# Patient Record
Sex: Female | Born: 1940 | Race: White | Hispanic: No | Smoking: Never smoker
Health system: Southern US, Community
[De-identification: ages and names within clinical notes are randomized; demographics above are authoritative.]

## PROBLEM LIST (undated history)

## (undated) DIAGNOSIS — R233 Spontaneous ecchymoses: Secondary | ICD-10-CM

## (undated) DIAGNOSIS — J189 Pneumonia, unspecified organism: Secondary | ICD-10-CM

## (undated) DIAGNOSIS — D869 Sarcoidosis, unspecified: Secondary | ICD-10-CM

## (undated) DIAGNOSIS — Z9981 Dependence on supplemental oxygen: Secondary | ICD-10-CM

## (undated) DIAGNOSIS — E785 Hyperlipidemia, unspecified: Secondary | ICD-10-CM

## (undated) DIAGNOSIS — R531 Weakness: Secondary | ICD-10-CM

## (undated) DIAGNOSIS — M109 Gout, unspecified: Secondary | ICD-10-CM

## (undated) DIAGNOSIS — J42 Unspecified chronic bronchitis: Secondary | ICD-10-CM

## (undated) DIAGNOSIS — G4700 Insomnia, unspecified: Secondary | ICD-10-CM

## (undated) DIAGNOSIS — I5032 Chronic diastolic (congestive) heart failure: Secondary | ICD-10-CM

## (undated) DIAGNOSIS — G4733 Obstructive sleep apnea (adult) (pediatric): Secondary | ICD-10-CM

## (undated) DIAGNOSIS — Z9289 Personal history of other medical treatment: Secondary | ICD-10-CM

## (undated) DIAGNOSIS — I4819 Other persistent atrial fibrillation: Secondary | ICD-10-CM

## (undated) DIAGNOSIS — Z9989 Dependence on other enabling machines and devices: Secondary | ICD-10-CM

## (undated) DIAGNOSIS — J449 Chronic obstructive pulmonary disease, unspecified: Secondary | ICD-10-CM

## (undated) DIAGNOSIS — K469 Unspecified abdominal hernia without obstruction or gangrene: Secondary | ICD-10-CM

## (undated) DIAGNOSIS — E119 Type 2 diabetes mellitus without complications: Secondary | ICD-10-CM

## (undated) DIAGNOSIS — J40 Bronchitis, not specified as acute or chronic: Secondary | ICD-10-CM

## (undated) DIAGNOSIS — S8292XA Unspecified fracture of left lower leg, initial encounter for closed fracture: Secondary | ICD-10-CM

## (undated) DIAGNOSIS — I10 Essential (primary) hypertension: Secondary | ICD-10-CM

## (undated) DIAGNOSIS — F0390 Unspecified dementia without behavioral disturbance: Secondary | ICD-10-CM

## (undated) DIAGNOSIS — R05 Cough: Secondary | ICD-10-CM

## (undated) DIAGNOSIS — C50919 Malignant neoplasm of unspecified site of unspecified female breast: Secondary | ICD-10-CM

## (undated) DIAGNOSIS — R6883 Chills (without fever): Secondary | ICD-10-CM

## (undated) DIAGNOSIS — E46 Unspecified protein-calorie malnutrition: Secondary | ICD-10-CM

## (undated) DIAGNOSIS — Z7901 Long term (current) use of anticoagulants: Secondary | ICD-10-CM

## (undated) DIAGNOSIS — R059 Cough, unspecified: Secondary | ICD-10-CM

## (undated) DIAGNOSIS — D693 Immune thrombocytopenic purpura: Secondary | ICD-10-CM

## (undated) DIAGNOSIS — R062 Wheezing: Secondary | ICD-10-CM

## (undated) DIAGNOSIS — C55 Malignant neoplasm of uterus, part unspecified: Secondary | ICD-10-CM

## (undated) DIAGNOSIS — R238 Other skin changes: Secondary | ICD-10-CM

## (undated) DIAGNOSIS — D649 Anemia, unspecified: Secondary | ICD-10-CM

## (undated) HISTORY — DX: Gout, unspecified: M10.9

## (undated) HISTORY — DX: Anemia, unspecified: D64.9

## (undated) HISTORY — DX: Wheezing: R06.2

## (undated) HISTORY — DX: Sarcoidosis, unspecified: D86.9

## (undated) HISTORY — DX: Other skin changes: R23.8

## (undated) HISTORY — DX: Malignant neoplasm of unspecified site of unspecified female breast: C50.919

## (undated) HISTORY — DX: Chronic diastolic (congestive) heart failure: I50.32

## (undated) HISTORY — DX: Hyperlipidemia, unspecified: E78.5

## (undated) HISTORY — DX: Personal history of other medical treatment: Z92.89

## (undated) HISTORY — DX: Chills (without fever): R68.83

## (undated) HISTORY — DX: Weakness: R53.1

## (undated) HISTORY — DX: Spontaneous ecchymoses: R23.3

## (undated) HISTORY — DX: Chronic obstructive pulmonary disease, unspecified: J44.9

## (undated) HISTORY — PX: TOTAL KNEE ARTHROPLASTY: SHX125

## (undated) HISTORY — DX: Unspecified protein-calorie malnutrition: E46

## (undated) HISTORY — DX: Unspecified abdominal hernia without obstruction or gangrene: K46.9

## (undated) HISTORY — DX: Bronchitis, not specified as acute or chronic: J40

## (undated) HISTORY — DX: Cough: R05

## (undated) HISTORY — DX: Unspecified dementia, unspecified severity, without behavioral disturbance, psychotic disturbance, mood disturbance, and anxiety: F03.90

## (undated) HISTORY — DX: Insomnia, unspecified: G47.00

## (undated) HISTORY — DX: Essential (primary) hypertension: I10

## (undated) HISTORY — DX: Morbid (severe) obesity due to excess calories: E66.01

## (undated) HISTORY — DX: Cough, unspecified: R05.9

## (undated) HISTORY — PX: HERNIA REPAIR: SHX51

---

## 1969-08-12 HISTORY — PX: TUBAL LIGATION: SHX77

## 1972-06-11 DIAGNOSIS — S8292XA Unspecified fracture of left lower leg, initial encounter for closed fracture: Secondary | ICD-10-CM

## 1972-06-11 HISTORY — DX: Unspecified fracture of left lower leg, initial encounter for closed fracture: S82.92XA

## 1998-10-01 ENCOUNTER — Ambulatory Visit (HOSPITAL_COMMUNITY): Admission: RE | Admit: 1998-10-01 | Discharge: 1998-10-01 | Payer: Self-pay | Admitting: Gastroenterology

## 1999-12-07 ENCOUNTER — Encounter: Payer: Self-pay | Admitting: *Deleted

## 1999-12-07 ENCOUNTER — Ambulatory Visit (HOSPITAL_COMMUNITY): Admission: RE | Admit: 1999-12-07 | Discharge: 1999-12-07 | Payer: Self-pay | Admitting: *Deleted

## 1999-12-15 ENCOUNTER — Encounter (INDEPENDENT_AMBULATORY_CARE_PROVIDER_SITE_OTHER): Payer: Self-pay | Admitting: *Deleted

## 1999-12-15 ENCOUNTER — Ambulatory Visit: Admission: RE | Admit: 1999-12-15 | Discharge: 1999-12-15 | Payer: Self-pay | Admitting: Internal Medicine

## 1999-12-15 ENCOUNTER — Encounter: Payer: Self-pay | Admitting: Internal Medicine

## 1999-12-23 ENCOUNTER — Ambulatory Visit (HOSPITAL_COMMUNITY): Admission: RE | Admit: 1999-12-23 | Discharge: 1999-12-23 | Payer: Self-pay | Admitting: Internal Medicine

## 2000-01-18 ENCOUNTER — Encounter: Payer: Self-pay | Admitting: Thoracic Surgery (Cardiothoracic Vascular Surgery)

## 2000-01-19 ENCOUNTER — Encounter (INDEPENDENT_AMBULATORY_CARE_PROVIDER_SITE_OTHER): Payer: Self-pay | Admitting: *Deleted

## 2000-01-19 ENCOUNTER — Ambulatory Visit (HOSPITAL_COMMUNITY)
Admission: RE | Admit: 2000-01-19 | Discharge: 2000-01-20 | Payer: Self-pay | Admitting: Thoracic Surgery (Cardiothoracic Vascular Surgery)

## 2000-01-19 ENCOUNTER — Encounter: Payer: Self-pay | Admitting: Thoracic Surgery (Cardiothoracic Vascular Surgery)

## 2000-01-23 ENCOUNTER — Emergency Department (HOSPITAL_COMMUNITY): Admission: EM | Admit: 2000-01-23 | Discharge: 2000-01-23 | Payer: Self-pay | Admitting: Emergency Medicine

## 2000-01-24 ENCOUNTER — Encounter: Payer: Self-pay | Admitting: Emergency Medicine

## 2000-02-11 ENCOUNTER — Encounter: Payer: Self-pay | Admitting: Internal Medicine

## 2000-02-11 ENCOUNTER — Inpatient Hospital Stay (HOSPITAL_COMMUNITY): Admission: AD | Admit: 2000-02-11 | Discharge: 2000-02-15 | Payer: Self-pay | Admitting: Internal Medicine

## 2000-02-12 ENCOUNTER — Encounter: Payer: Self-pay | Admitting: Internal Medicine

## 2000-05-23 ENCOUNTER — Ambulatory Visit (HOSPITAL_BASED_OUTPATIENT_CLINIC_OR_DEPARTMENT_OTHER): Admission: RE | Admit: 2000-05-23 | Discharge: 2000-05-23 | Payer: Self-pay | Admitting: Internal Medicine

## 2000-09-27 ENCOUNTER — Emergency Department (HOSPITAL_COMMUNITY): Admission: EM | Admit: 2000-09-27 | Discharge: 2000-09-27 | Payer: Self-pay

## 2000-09-27 ENCOUNTER — Encounter: Payer: Self-pay | Admitting: Emergency Medicine

## 2001-01-26 ENCOUNTER — Emergency Department (HOSPITAL_COMMUNITY): Admission: EM | Admit: 2001-01-26 | Discharge: 2001-01-26 | Payer: Self-pay | Admitting: Emergency Medicine

## 2001-01-26 ENCOUNTER — Encounter: Payer: Self-pay | Admitting: Emergency Medicine

## 2001-03-21 ENCOUNTER — Inpatient Hospital Stay (HOSPITAL_COMMUNITY): Admission: EM | Admit: 2001-03-21 | Discharge: 2001-03-28 | Payer: Self-pay

## 2001-03-21 ENCOUNTER — Encounter: Payer: Self-pay | Admitting: Pulmonary Disease

## 2001-03-22 ENCOUNTER — Encounter: Payer: Self-pay | Admitting: Internal Medicine

## 2001-03-27 ENCOUNTER — Encounter: Payer: Self-pay | Admitting: Internal Medicine

## 2001-07-04 ENCOUNTER — Ambulatory Visit (HOSPITAL_COMMUNITY): Admission: RE | Admit: 2001-07-04 | Discharge: 2001-07-04 | Payer: Self-pay | Admitting: *Deleted

## 2001-07-04 ENCOUNTER — Encounter: Payer: Self-pay | Admitting: *Deleted

## 2001-08-14 ENCOUNTER — Encounter: Admission: RE | Admit: 2001-08-14 | Discharge: 2001-08-14 | Payer: Self-pay | Admitting: Specialist

## 2001-08-14 ENCOUNTER — Encounter: Payer: Self-pay | Admitting: Specialist

## 2001-08-24 ENCOUNTER — Other Ambulatory Visit: Admission: RE | Admit: 2001-08-24 | Discharge: 2001-08-24 | Payer: Self-pay | Admitting: Obstetrics and Gynecology

## 2002-10-17 ENCOUNTER — Other Ambulatory Visit: Admission: RE | Admit: 2002-10-17 | Discharge: 2002-10-17 | Payer: Self-pay | Admitting: Obstetrics and Gynecology

## 2003-10-21 ENCOUNTER — Other Ambulatory Visit: Admission: RE | Admit: 2003-10-21 | Discharge: 2003-10-21 | Payer: Self-pay | Admitting: Obstetrics and Gynecology

## 2004-10-18 ENCOUNTER — Other Ambulatory Visit: Admission: RE | Admit: 2004-10-18 | Discharge: 2004-10-18 | Payer: Self-pay | Admitting: Obstetrics and Gynecology

## 2004-12-20 ENCOUNTER — Ambulatory Visit: Payer: Self-pay | Admitting: Internal Medicine

## 2005-01-12 ENCOUNTER — Ambulatory Visit (HOSPITAL_COMMUNITY): Admission: RE | Admit: 2005-01-12 | Discharge: 2005-01-12 | Payer: Self-pay | Admitting: Obstetrics and Gynecology

## 2005-01-12 ENCOUNTER — Encounter (INDEPENDENT_AMBULATORY_CARE_PROVIDER_SITE_OTHER): Payer: Self-pay | Admitting: *Deleted

## 2005-02-04 HISTORY — PX: UMBILICAL HERNIA REPAIR: SHX196

## 2005-02-04 HISTORY — PX: ABDOMINAL HYSTERECTOMY: SHX81

## 2005-05-06 ENCOUNTER — Encounter: Admission: RE | Admit: 2005-05-06 | Discharge: 2005-05-06 | Payer: Self-pay | Admitting: Sports Medicine

## 2005-05-11 ENCOUNTER — Encounter: Admission: RE | Admit: 2005-05-11 | Discharge: 2005-08-09 | Payer: Self-pay | Admitting: Chiropractic Medicine

## 2005-06-08 ENCOUNTER — Encounter: Admission: RE | Admit: 2005-06-08 | Discharge: 2005-06-08 | Payer: Self-pay | Admitting: Sports Medicine

## 2005-06-11 ENCOUNTER — Emergency Department (HOSPITAL_COMMUNITY): Admission: EM | Admit: 2005-06-11 | Discharge: 2005-06-11 | Payer: Self-pay | Admitting: Emergency Medicine

## 2005-06-13 ENCOUNTER — Ambulatory Visit: Payer: Self-pay | Admitting: Internal Medicine

## 2005-08-10 ENCOUNTER — Encounter: Admission: RE | Admit: 2005-08-10 | Discharge: 2005-09-11 | Payer: Self-pay | Admitting: Chiropractic Medicine

## 2005-09-12 ENCOUNTER — Encounter: Admission: RE | Admit: 2005-09-12 | Discharge: 2005-10-04 | Payer: Self-pay | Admitting: Chiropractic Medicine

## 2005-10-04 ENCOUNTER — Ambulatory Visit: Payer: Self-pay | Admitting: Internal Medicine

## 2005-10-05 ENCOUNTER — Encounter: Admission: RE | Admit: 2005-10-05 | Discharge: 2005-10-31 | Payer: Self-pay | Admitting: Chiropractic Medicine

## 2005-12-06 ENCOUNTER — Ambulatory Visit: Payer: Self-pay | Admitting: Internal Medicine

## 2005-12-29 ENCOUNTER — Ambulatory Visit: Payer: Self-pay | Admitting: Internal Medicine

## 2006-03-28 ENCOUNTER — Ambulatory Visit: Payer: Self-pay | Admitting: Internal Medicine

## 2006-04-04 ENCOUNTER — Ambulatory Visit: Payer: Self-pay | Admitting: Internal Medicine

## 2006-05-30 ENCOUNTER — Ambulatory Visit: Payer: Self-pay | Admitting: Internal Medicine

## 2006-11-30 ENCOUNTER — Ambulatory Visit: Payer: Self-pay | Admitting: Internal Medicine

## 2007-05-29 ENCOUNTER — Ambulatory Visit: Payer: Self-pay | Admitting: Internal Medicine

## 2008-05-27 ENCOUNTER — Ambulatory Visit: Payer: Self-pay | Admitting: Internal Medicine

## 2008-05-27 DIAGNOSIS — J40 Bronchitis, not specified as acute or chronic: Secondary | ICD-10-CM

## 2008-05-27 DIAGNOSIS — I4891 Unspecified atrial fibrillation: Secondary | ICD-10-CM

## 2008-05-27 DIAGNOSIS — G4733 Obstructive sleep apnea (adult) (pediatric): Secondary | ICD-10-CM

## 2008-05-27 DIAGNOSIS — D869 Sarcoidosis, unspecified: Secondary | ICD-10-CM | POA: Insufficient documentation

## 2008-06-18 ENCOUNTER — Encounter: Payer: Self-pay | Admitting: Internal Medicine

## 2008-06-24 ENCOUNTER — Ambulatory Visit: Payer: Self-pay | Admitting: Internal Medicine

## 2008-07-02 ENCOUNTER — Telehealth: Payer: Self-pay | Admitting: Internal Medicine

## 2008-07-07 ENCOUNTER — Telehealth (INDEPENDENT_AMBULATORY_CARE_PROVIDER_SITE_OTHER): Payer: Self-pay | Admitting: *Deleted

## 2008-07-12 HISTORY — PX: TRANSTHORACIC ECHOCARDIOGRAM: SHX275

## 2008-07-17 ENCOUNTER — Encounter: Payer: Self-pay | Admitting: Internal Medicine

## 2008-11-10 ENCOUNTER — Encounter: Payer: Self-pay | Admitting: Internal Medicine

## 2008-12-22 ENCOUNTER — Ambulatory Visit: Payer: Self-pay | Admitting: Internal Medicine

## 2009-01-06 ENCOUNTER — Telehealth: Payer: Self-pay | Admitting: Internal Medicine

## 2009-01-20 ENCOUNTER — Encounter: Admission: RE | Admit: 2009-01-20 | Discharge: 2009-04-20 | Payer: Self-pay | Admitting: Chiropractic Medicine

## 2009-06-22 ENCOUNTER — Ambulatory Visit: Payer: Self-pay | Admitting: Internal Medicine

## 2009-10-12 ENCOUNTER — Encounter: Payer: Self-pay | Admitting: Internal Medicine

## 2009-11-11 ENCOUNTER — Encounter: Payer: Self-pay | Admitting: Internal Medicine

## 2010-01-05 ENCOUNTER — Encounter (HOSPITAL_COMMUNITY): Admission: RE | Admit: 2010-01-05 | Discharge: 2010-03-19 | Payer: Self-pay | Admitting: Orthopedic Surgery

## 2010-01-22 ENCOUNTER — Ambulatory Visit: Payer: Self-pay | Admitting: Internal Medicine

## 2010-02-09 DIAGNOSIS — Z9289 Personal history of other medical treatment: Secondary | ICD-10-CM

## 2010-02-09 HISTORY — DX: Personal history of other medical treatment: Z92.89

## 2010-03-29 ENCOUNTER — Inpatient Hospital Stay (HOSPITAL_COMMUNITY): Admission: RE | Admit: 2010-03-29 | Discharge: 2010-04-01 | Payer: Self-pay | Admitting: Orthopedic Surgery

## 2010-04-02 ENCOUNTER — Encounter (INDEPENDENT_AMBULATORY_CARE_PROVIDER_SITE_OTHER): Payer: Self-pay | Admitting: Emergency Medicine

## 2010-04-02 ENCOUNTER — Ambulatory Visit: Payer: Self-pay | Admitting: Surgery

## 2010-04-02 ENCOUNTER — Emergency Department (HOSPITAL_COMMUNITY): Admission: EM | Admit: 2010-04-02 | Discharge: 2010-04-02 | Payer: Self-pay | Admitting: Emergency Medicine

## 2010-05-25 ENCOUNTER — Ambulatory Visit: Payer: Self-pay | Admitting: Internal Medicine

## 2010-06-15 ENCOUNTER — Encounter: Admission: RE | Admit: 2010-06-15 | Discharge: 2010-07-23 | Payer: Self-pay | Admitting: Orthopedic Surgery

## 2010-09-19 ENCOUNTER — Encounter: Payer: Self-pay | Admitting: Internal Medicine

## 2010-12-21 ENCOUNTER — Ambulatory Visit
Admission: RE | Admit: 2010-12-21 | Discharge: 2010-12-21 | Payer: Self-pay | Source: Home / Self Care | Attending: Internal Medicine | Admitting: Internal Medicine

## 2010-12-28 ENCOUNTER — Other Ambulatory Visit: Payer: Self-pay | Admitting: Radiology

## 2011-01-03 ENCOUNTER — Encounter
Admission: RE | Admit: 2011-01-03 | Discharge: 2011-01-03 | Payer: Self-pay | Source: Home / Self Care | Attending: Radiology | Admitting: Radiology

## 2011-01-11 NOTE — Letter (Signed)
Summary: [facility]   Imported By: Sherian Rein 11/03/2010 08:53:39  _____________________________________________________________________  External Attachment:    Type:   Image     Comment:   External Document

## 2011-01-11 NOTE — Assessment & Plan Note (Signed)
Summary: rov 6 months///kp   Primary Provider/Referring Provider:  Holley Bouche  CC:  follow up visit.  History of Present Illness: 06/22/09- Sarcoid, bornchitis, sleep apnea, PAF Continues CPAP without problems. Falls asleep watching TV routinely. Rarely takes caffeine. Feels she sleeps well at night with her dog.  Breathing is "wonderful", No asthma complaints lately at all. Chronic edema left greater than right leg- no hx DVT- on coumadin  January 22, 2010- Sarcoid, bronchitis, OSA, PAF, Morbid obesity Had flu vax. Getting through the winter without respiratory problems or bad colds. Remains sleepy any time she sits. She is pending knee replacement. Continues compliant with cpap all night every night. Denies palpitation or chest pain on current meds.  May 25, 2010- Hx Sarcoid, OSA, PAF, Morbid obesity Has been at Rehab facility after uncomplicated left knee replacement in April.  Continues CPAP all night, every night. Ritalin also helps with alertness. She rarely needs her proventil inhlaer or her nebulizer.   Preventive Screening-Counseling & Management  Alcohol-Tobacco     Smoking Status: never  Current Medications (verified): 1)  Theophylline Cr 200 Mg  Xr12h-Tab (Theophylline) .Marland Kitchen.. 1 Twice Daily 2)  Advair Diskus 250-50 Mcg/dose  Misc (Fluticasone-Salmeterol) .Marland Kitchen.. 1 Puff Two Times A Day 3)  Proventil Hfa 108 (90 Base) Mcg/act  Aers (Albuterol Sulfate) .... Once Daily 4)  Home Nebulizer With Albuterol .... As Needed 5)  Cpap At9 Cwp 6)  Glucophage Xr 500 Mg  Tb24 (Metformin Hcl) .... Bid 7)  Bumetanide 1 Mg Tabs (Bumetanide) .... Take 1 By Mouth Once Daily 8)  Warfarin Sodium 1 Mg  Tabs (Warfarin Sodium) .... As Directed 9)  Vitamins and Calcium 10)  Tarka 2-240 Mg Cr-Tabs (Trandolapril-Verapamil Hcl) .... Take 1 By Mouth Once Daily 11)  Simvastatin 40 Mg Tabs (Simvastatin) .... Take 1 By Mouth Once Daily 12)  Gabapentin 300 Mg Caps (Gabapentin) .... Take 1 By Mouth  Three Times A Day As Needed 13)  Fexofenadine Hcl 180 Mg Tabs (Fexofenadine Hcl) .... Take 1 By Mouth Once Daily As Needed 14)  Ritalin 5 Mg Tabs (Methylphenidate Hcl) .Marland Kitchen.. 1 Twice Daily If Needed 15)  K-Lor 20 Meq Pack (Potassium Chloride) .... Take 1 By Mouth  Two Times A Day 16)  Senokot 8.6 Mg Tabs (Sennosides) .... Take 1 By Mouth Two Times A Day 17)  Ventolin Hfa 108 (90 Base) Mcg/act Aers (Albuterol Sulfate) .... 2 Puffs Four Times A Day As Needed 18)  Robaxin 500 Mg Tabs (Methocarbamol) .... Take 1 By Mouth Three Times A Day As Needed 19)  Ultracet 37.5-325 Mg Tabs (Tramadol-Acetaminophen) .... Take 1 By Mouth Every 6 Hours As Needed Pain 20)  Percocet 5-325 Mg Tabs (Oxycodone-Acetaminophen) .... Take 1 By Mouth Every 6 Hours As Needed  Allergies (verified): 1)  ! 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   Risk Factors: Smoking Status: never (05/25/2010)  Past Surgical History: hysterectomy for female cancer left lower leg fracture- casted Left knee replacement- 2001  Review of Systems      See HPI  The patient denies shortness of breath with activity, shortness of breath at rest, productive cough, non-productive cough, coughing up blood, chest pain, irregular  heartbeats, acid heartburn, indigestion, loss of appetite, weight change, abdominal pain, difficulty swallowing, sore throat, tooth/dental problems, headaches, nasal congestion/difficulty breathing through nose, and sneezing.    Vital Signs:  Patient profile:   70 year old female O2 Sat:       93 % on Room air Pulse rate:   81 / minute BP sitting:   124 / 58  (right arm) Cuff size:   large  Vitals Entered By: Reynaldo Minium CMA (May 25, 2010 10:03 AM)  O2 Flow:  Room air  Physical Exam  Additional Exam:  General: A/Ox3; pleasant and cooperative, NAD,  morbidly obese,  SKIN: no rash, lesions NODES: no lymphadenopathy HEENT: [place]/AT, EOM- WNL, Conjuctivae- clear, PERRLA, TM-WNL, Nose- clear, Throat- clear and wnl, dentures, Mallampti  III NECK: Supple w/ fair ROM, JVD- none, normal carotid impulses w/o bruits Thyroid- normal to palpation CHEST: Clear to P&A HEART: RRR, 1-2/6 SEM  ABDOMEN: Soft, obese KYH:CWCB, nl pulses, edema left greater than right leg, elastic hose, wheelchair NEURO: Grossly intact to observation      Impression & Recommendations:  Problem # 1:  Hx of PULMONARY SARCOIDOSIS (ICD-135) Not clinically active. I reminded her of our previous discussions. This is not likely to become an active process again.  Problem # 2:  SLEEP APNEA (ICD-780.57)  Good compliance and control on CPAP. Weight loss is incidental to her recent surgery. Hopefully she can keep it off.  Problem # 3:  BRONCHITIS (ICD-490)  No cough or wheeze now and not needing acitve bvronchodilator oftenm Her updated medication list for this problem includes:    Theophylline Cr 200 Mg Xr12h-tab (Theophylline) .Marland Kitchen... 1 twice daily    Advair Diskus 250-50 Mcg/dose Misc (Fluticasone-salmeterol) .Marland Kitchen... 1 puff two times a day    Proventil Hfa 108 (90 Base) Mcg/act Aers (Albuterol sulfate) ..... Once daily    Ventolin Hfa 108 (90 Base) Mcg/act Aers (Albuterol sulfate) .Marland Kitchen... 2 puffs four times a day as needed  Problem # 4:  ATRIAL FIBRILLATION, PAROXYSMAL (ICD-427.31) Rhythm is regular now c/w with NSR. Her updated medication list for this problem includes:    Warfarin Sodium 1 Mg Tabs (Warfarin sodium) .Marland Kitchen... As directed  Medications Added to Medication List This Visit: 1)  Tarka 2-240 Mg  Cr-tabs (Trandolapril-verapamil hcl) .... Take 1 by mouth once daily 2)  K-lor 20 Meq Pack (Potassium chloride) .... Take 1 by mouth  two times a day 3)  Senokot 8.6 Mg Tabs (Sennosides) .... Take 1 by mouth two times a day 4)  Ventolin Hfa 108 (90 Base) Mcg/act Aers (Albuterol sulfate) .... 2 puffs four times a day as needed 5)  Robaxin 500 Mg Tabs (Methocarbamol) .... Take 1 by mouth three times a day as needed 6)  Ultracet 37.5-325 Mg Tabs (Tramadol-acetaminophen) .... Take 1 by mouth every 6 hours as needed pain 7)  Percocet 5-325 Mg Tabs (Oxycodone-acetaminophen) .... Take 1 by mouth every 6 hours as needed  Other Orders: Est. Patient Level IV (76283)  Patient Instructions: 1)  Please schedule a follow-up appointment in 6 months. 2)  Continue CPAP 3)  .

## 2011-01-11 NOTE — Assessment & Plan Note (Signed)
Summary: 6 months/ mbw   Primary Provider/Referring Provider:  Holley Bouche  CC:  follow up visit.  History of Present Illness: 12/22/08- Sarcoid, bronchitis, sleep apnea, PAF Being considered for TKR but delayed by joint swelling. Breathing excellent with no problems. Usually Advair 1x daily is sufficient. Takes naps but states she sleeps very well at night and is fully compliant with cpap. Last year autotitrated to 8.5. She is on CPAP 8 and satisfied with this. Had both flu vax. Has been unable to sustain weight loss.  06/22/09- Sarcoid, bornchitis, sleep apnea, PAF Continues CPAP without problems. Falls asleep watching TV routinely. Rarely takes caffeine. Feels she sleeps well at night with her dog.  Breathing is "wonderful", No asthma complaints lately at all. Chronic edema left greater than right leg- no hx DVT- on coumadin  January 22, 2010- Sarcoid, bronchitis, OSA, PAF, Morbid obesity Had flu vax. Getting through the winter without respiratory problems or bad colds. Remains sleepy any time she sits. She is pending knee replacement. Continues compliant with cpap all night every night. Denies palpitation or chest pain on current meds.   Current Medications (verified): 1)  Theophylline Cr 200 Mg  Xr12h-Tab (Theophylline) .Marland Kitchen.. 1 Twice Daily 2)  Advair Diskus 250-50 Mcg/dose  Misc (Fluticasone-Salmeterol) .Marland Kitchen.. 1 Puff Two Times A Day 3)  Proventil Hfa 108 (90 Base) Mcg/act  Aers (Albuterol Sulfate) .... Once Daily 4)  Home Nebulizer With Albuterol .... As Needed 5)  Cpap At9 Cwp 6)  Glucophage Xr 500 Mg  Tb24 (Metformin Hcl) .... Bid 7)  Bumetanide 1 Mg Tabs (Bumetanide) .... Take 1 By Mouth Once Daily 8)  Warfarin Sodium 1 Mg  Tabs (Warfarin Sodium) .... As Directed 9)  Vitamins and Calcium 10)  Tarka 1-240 Mg  Tbcr (Trandolapril-Verapamil Hcl) .... Once Daily 11)  Chelated Potassium 99 Mg  Tabs (Potassium) .... Two Times A Day 12)  Simvastatin 40 Mg Tabs (Simvastatin) ....  Take 1 By Mouth Once Daily 13)  Gabapentin 300 Mg Caps (Gabapentin) .... Take 1 By Mouth Three Times A Day As Needed 14)  Fexofenadine Hcl 180 Mg Tabs (Fexofenadine Hcl) .... Take 1 By Mouth Once Daily As Needed  Allergies (verified): 1)  ! 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   Risk Factors: Smoking Status: never (June 13, 2008)  Review of Systems      See HPI  The patient denies anorexia, fever, weight loss, weight gain, vision loss, decreased hearing, hoarseness, chest pain, syncope, dyspnea on exertion, peripheral edema, prolonged cough, headaches, hemoptysis, abdominal pain, and severe indigestion/heartburn.    Vital Signs:  Patient profile:   70 year old female Weight:      299 pounds O2 Sat:      96 % on Room air Pulse rate:   65 / minute BP sitting:   110 / 72  (left arm) Cuff size:   regular  Vitals Entered By: Reynaldo Minium CMA (January 22, 2010 10:13 AM)  O2 Flow:  Room air  Physical Exam  Additional Exam:  General: A/Ox3; pleasant and cooperative, NAD,  morbidly obese, alert, talkative  SKIN: no rash, lesions NODES: no lymphadenopathy HEENT: [place]/AT, EOM- WNL, Conjuctivae- clear, PERRLA, TM-WNL, Nose- clear, Throat- clear and wnl, dentures, Melampatti III NECK: Supple w/ fair ROM, JVD- none, normal carotid impulses w/o bruits Thyroid- normal to palpation CHEST: Clear to P&A HEART: RRR, 1-2/6 SEM  ABDOMEN: Soft and  nl; WUX:LKGM, nl pulses, edema left greater than right leg, elastic hose, rolling wealker NEURO: Grossly intact to observation      Impression & Recommendations:  Problem # 1:  Hx of PULMONARY SARCOIDOSIS (ICD-135) Not symptomatic to suggest active disease now. Morbid obesity with obesity hypoventilation is a bigger concern.  Problem # 2:  ATRIAL FIBRILLATION, PAROXYSMAL (ICD-427.31)  Rhythm is now regular to palpation. Her updated medication list for this problem includes:    Warfarin Sodium 1 Mg Tabs (Warfarin sodium) .Marland Kitchen... As directed  Problem # 3:  SLEEP APNEA (ICD-780.57)  CPAP pressure seems to be appropriate and she is compliant. Residual daytime somnolence may respond to low dose ritalin, enough to test cardiac rhythm tolerance. Weight loss would help.  Medications Added to Medication List This Visit: 1)  Chelated Potassium 99 Mg Tabs (Potassium) .... Two times a day 2)  Ritalin 5 Mg Tabs (Methylphenidate hcl) .Marland Kitchen.. 1 twice daily if needed  Other Orders: Est. Patient Level III (01027)  Patient Instructions: 1)  Please schedule a follow-up appointment in 4 months. 2)  Script to try ritalin/ methylphenidate for alertness if needed Prescriptions: RITALIN 5 MG TABS (METHYLPHENIDATE HCL) 1 twice daily if needed  #50 x 0   Entered and Authorized by:   Waymon Budge MD   Signed by:   Waymon Budge MD on 01/22/2010   Method used:   Print then Give to Patient   RxID:   [id number]

## 2011-01-12 HISTORY — PX: BREAST BIOPSY: SHX20

## 2011-01-12 HISTORY — PX: BREAST LUMPECTOMY: SHX2

## 2011-01-13 NOTE — Assessment & Plan Note (Signed)
Summary: 6 month f/u ///kp   Primary Provider/Referring Provider:  Holley Bouche  CC:  6 mos followup cpap working well; sob only with exertion.  History of Present Illness: 06/22/09- Sarcoid, bornchitis, sleep apnea, PAF Continues CPAP without problems. Falls asleep watching TV routinely. Rarely takes caffeine. Feels she sleeps well at night with her dog.  Breathing is "wonderful", No asthma complaints lately at all. Chronic edema left greater than right leg- no hx DVT- on coumadin  January 22, 2010- Sarcoid, bronchitis, OSA, PAF, Morbid obesity Had flu vax. Getting through the winter without respiratory problems or bad colds. Remains sleepy any time she sits. She is pending knee replacement. Continues compliant with cpap all night every night. Denies palpitation or chest pain on current meds.  May 25, 2010- Hx Sarcoid, OSA, PAF, Morbid obesity Has been at Rehab facility after uncomplicated left knee replacement in April.  Continues CPAP all night, every night. Ritalin also helps with alertness. She rarely needs her proventil inhlaer or her nebulizer.  December 21, 2010-  Hx Sarcoid, OSA, PAF/ warfarin, Morbid obesity Nurse-CC: 6 mos followup cpap working well; sob only with exertion Says she has been stable since last here with no acute issues, but now planning TKR right knee next month- Dr Despina Hick. Had no respiratory problems with left TKR in April, 2011.  CPAP- all night, every night, 9 cwp. She stopped Ritalin- never effective for her persistent daytime sleepiness, but naps are refreshing. Sleeps ok through the night w/o sleeping pill.      Preventive Screening-Counseling & Management  Alcohol-Tobacco     Smoking Status: never  Current Medications (verified): 1)  Theophylline Cr 200 Mg  Xr12h-Tab (Theophylline) .Marland Kitchen.. 1 Twice Daily 2)  Advair Diskus 250-50 Mcg/dose  Misc (Fluticasone-Salmeterol) .Marland Kitchen.. 1 Puff Two Times A Day 3)  Proventil Hfa 108 (90 Base) Mcg/act  Aers  (Albuterol Sulfate) .... Once Daily 4)  Home Nebulizer With Albuterol .... As Needed 5)  Cpap At9 Cwp 6)  Glucophage Xr 500 Mg  Tb24 (Metformin Hcl) .Marland Kitchen.. 1 By Mouth Once Daily 7)  Bumetanide 1 Mg Tabs (Bumetanide) .... Take 1 By Mouth Once Daily 8)  Warfarin Sodium 1 Mg  Tabs (Warfarin Sodium) .... As Directed 9)  Vitamins and Calcium 10)  Tarka 2-240 Mg Cr-Tabs (Trandolapril-Verapamil Hcl) .... Take 1 By Mouth Once Daily 11)  Simvastatin 40 Mg Tabs (Simvastatin) .... Take 1 By Mouth Once Daily 12)  Fexofenadine Hcl 180 Mg Tabs (Fexofenadine Hcl) .... Take 1 By Mouth Once Daily As Needed 13)  K-Lor 20 Meq Pack (Potassium Chloride) .... Take 1 By Mouth  Two Times A Day 14)  Senokot 8.6 Mg Tabs (Sennosides) .... Take 1 By Mouth Two Times A Day 15)  Ventolin Hfa 108 (90 Base) Mcg/act Aers (Albuterol Sulfate) .... 2 Puffs Four Times A Day As Needed  Allergies: 1)  ! 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   Risk Factors: Smoking Status: never (12/21/2010)  Review of Systems      See HPI       The  patient complains of shortness of breath with activity.  The patient denies productive cough, non-productive cough, coughing up blood, chest pain, irregular heartbeats, acid heartburn, indigestion, loss of appetite, weight change, abdominal pain, difficulty swallowing, sore throat, tooth/dental problems, headaches, nasal  congestion/difficulty breathing through nose, and sneezing.    Vital Signs:  Patient profile:   70 year old female Height:      59 inches Weight:      304.50 pounds BMI:     61.72 O2 Sat:      97 % on Room air Pulse rate:   77 / minute BP sitting:   120 / 60  Vitals Entered By: Kandice Hams CMA (December 21, 2010 2:25 PM)  O2 Flow:  Room air CC: 6 mos followup cpap working well; sob only with exertion Comments pharmacy verified    Physical Exam  Additional Exam:  General: A/Ox3; pleasant and cooperative, NAD,  morbidly obese,  SKIN: no rash, lesions NODES: no lymphadenopathy HEENT: [place]/AT, EOM- WNL, Conjuctivae- clear, PERRLA, TM-WNL, Nose- clear, Throat- clear and wnl, dentures, Mallampti  III NECK: Supple w/ fair ROM, JVD- none, normal carotid impulses w/o bruits Thyroid- normal to palpation CHEST: Clear to P&A HEART: RRR, 1-2/6 SEM pulse is very regular today ABDOMEN: Soft, obese OAC:ZYSA, nl pulses, edema left greater than right leg, elastic hose,  NEURO: Grossly intact to observation      Impression & Recommendations:  Problem # 1:  SLEEP APNEA (ICD-780.57)  Good compliance and control on CPAP. I am not able to get her to lose weight, which would help with this.   Problem # 2:  BRONCHITIS (ICD-490)  Clear now. I don't anticipate respiratory problems with her next surgery at this point, since she did well the first time.  Her updated medication list for this problem includes:    Theophylline Cr 200 Mg Xr12h-tab (Theophylline) .Marland Kitchen... 1 twice daily    Advair Diskus 250-50 Mcg/dose Misc (Fluticasone-salmeterol) .Marland Kitchen... 1 puff two times a day    Proventil Hfa 108 (90 Base) Mcg/act Aers (Albuterol sulfate) ..... Once daily    Ventolin Hfa 108 (90 Base) Mcg/act Aers (Albuterol sulfate) .Marland Kitchen... 2 puffs four times a day as needed  Problem # 3:  Hx of PULMONARY SARCOIDOSIS (ICD-135)  Medications Added to Medication List This Visit: 1)  Cpap 9 Cwp Advanced  2)  Glucophage  Xr 500 Mg Tb24 (Metformin hcl) .Marland Kitchen.. 1 by mouth once daily 3)  Fexofenadine Hcl 180 Mg Tabs (Fexofenadine hcl) .... Take 1 by mouth once daily as needed  Other Orders: Est. Patient Level III (63016)  Patient Instructions: 1)  Please schedule a follow-up appointment in 6 months. 2)  contnue CPAP at 9

## 2011-01-28 ENCOUNTER — Other Ambulatory Visit (HOSPITAL_COMMUNITY): Payer: Self-pay | Admitting: General Surgery

## 2011-01-28 DIAGNOSIS — C50911 Malignant neoplasm of unspecified site of right female breast: Secondary | ICD-10-CM

## 2011-02-03 ENCOUNTER — Encounter (HOSPITAL_COMMUNITY)
Admission: RE | Admit: 2011-02-03 | Discharge: 2011-02-03 | Disposition: A | Discharge status: Home / Self Care | Payer: Medicare Other | Source: Ambulatory Visit | Attending: General Surgery | Admitting: General Surgery

## 2011-02-03 DIAGNOSIS — Z01812 Encounter for preprocedural laboratory examination: Secondary | ICD-10-CM | POA: Insufficient documentation

## 2011-02-03 LAB — URINALYSIS, ROUTINE W REFLEX MICROSCOPIC
Hgb urine dipstick: NEGATIVE
Protein, ur: NEGATIVE mg/dL
Specific Gravity, Urine: 1.022 (ref 1.005–1.030)
Urine Glucose, Fasting: NEGATIVE mg/dL
pH: 5 (ref 5.0–8.0)

## 2011-02-03 LAB — COMPREHENSIVE METABOLIC PANEL
AST: 21 U/L (ref 0–37)
Albumin: 3.9 g/dL (ref 3.5–5.2)
BUN: 33 mg/dL — ABNORMAL HIGH (ref 6–23)
Calcium: 9.6 mg/dL (ref 8.4–10.5)
Chloride: 104 mEq/L (ref 96–112)
Creatinine, Ser: 1.02 mg/dL (ref 0.4–1.2)
Total Protein: 6.8 g/dL (ref 6.0–8.3)

## 2011-02-03 LAB — DIFFERENTIAL
Basophils Relative: 0 % (ref 0–1)
Eosinophils Absolute: 0.3 10*3/uL (ref 0.0–0.7)
Eosinophils Relative: 3 % (ref 0–5)
Monocytes Absolute: 0.7 10*3/uL (ref 0.1–1.0)
Monocytes Relative: 8 % (ref 3–12)

## 2011-02-03 LAB — CBC
MCH: 27.3 pg (ref 26.0–34.0)
MCHC: 33.7 g/dL (ref 30.0–36.0)
MCV: 81 fL (ref 78.0–100.0)
Platelets: 234 10*3/uL (ref 150–400)

## 2011-02-03 LAB — SURGICAL PCR SCREEN: MRSA, PCR: NEGATIVE

## 2011-02-08 ENCOUNTER — Observation Stay (HOSPITAL_COMMUNITY)
Admission: RE | Admit: 2011-02-08 | Discharge: 2011-02-09 | DRG: 581 | Disposition: A | Discharge status: Home / Self Care | Payer: Medicare Other | Source: Ambulatory Visit | Attending: General Surgery | Admitting: General Surgery

## 2011-02-08 ENCOUNTER — Ambulatory Visit (HOSPITAL_COMMUNITY)
Admission: RE | Admit: 2011-02-08 | Discharge: 2011-02-08 | Disposition: A | Discharge status: Home / Self Care | Payer: Medicare Other | Source: Ambulatory Visit | Attending: General Surgery | Admitting: General Surgery

## 2011-02-08 ENCOUNTER — Other Ambulatory Visit: Payer: Self-pay | Admitting: General Surgery

## 2011-02-08 DIAGNOSIS — Z170 Estrogen receptor positive status [ER+]: Secondary | ICD-10-CM | POA: Insufficient documentation

## 2011-02-08 DIAGNOSIS — E119 Type 2 diabetes mellitus without complications: Secondary | ICD-10-CM | POA: Insufficient documentation

## 2011-02-08 DIAGNOSIS — G4730 Sleep apnea, unspecified: Secondary | ICD-10-CM | POA: Insufficient documentation

## 2011-02-08 DIAGNOSIS — M1990 Unspecified osteoarthritis, unspecified site: Secondary | ICD-10-CM | POA: Insufficient documentation

## 2011-02-08 DIAGNOSIS — C50119 Malignant neoplasm of central portion of unspecified female breast: Principal | ICD-10-CM | POA: Insufficient documentation

## 2011-02-08 DIAGNOSIS — C50911 Malignant neoplasm of unspecified site of right female breast: Secondary | ICD-10-CM

## 2011-02-08 DIAGNOSIS — J45909 Unspecified asthma, uncomplicated: Secondary | ICD-10-CM | POA: Insufficient documentation

## 2011-02-08 LAB — GLUCOSE, CAPILLARY
Glucose-Capillary: 236 mg/dL — ABNORMAL HIGH (ref 70–99)
Glucose-Capillary: 198 mg/dL — ABNORMAL HIGH (ref 70–99)
Glucose-Capillary: 200 mg/dL — ABNORMAL HIGH (ref 70–99)

## 2011-02-08 MED ORDER — TECHNETIUM TC 99M SULFUR COLLOID FILTERED
1.0000 | Freq: Once | INTRAVENOUS | Status: AC | PRN
  Administered 2011-02-08: 1 via INTRADERMAL

## 2011-02-09 LAB — GLUCOSE, CAPILLARY: Glucose-Capillary: 162 mg/dL — ABNORMAL HIGH (ref 70–99)

## 2011-02-09 NOTE — Op Note (Signed)
NAMEJUNE, [name]                ACCOUNT NO.:  [id number]  MEDICAL RECORD NO.:  [id number]           PATIENT TYPE:  I  LOCATION:  5152                         FACILITY:  [facility]  PHYSICIAN:  Angelia Mould. 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  OPERATION PERFORMED: 1. Inject blue dye right breast. 2. Right partial mastectomy with needle localization. 3. Re-excision additional medial margin. 4. Right axillary sentinel lymph node mapping and biopsy.  SURGEON:  Angelia Mould. [name], MD  OPERATIVE INDICATIONS:  This is a 70 year old Caucasian female who has problems with asthma and degenerative joint disease, diabetes, and paroxysmal atrial fibrillation.  Recent mammograms and subsequent MRI and ultrasound show a density in the right breast at 12 o'clock position deep within her breast.  Image-guided biopsy reveals invasive mammary carcinoma, which is receptor positive and HER2 negative.  The MRI confirms that this is a solitary finding.  It was her desire to have breast conservation.  I felt that she was a reasonable candidate for this, especially considering the very large size of her breast which would make mastectomy very problematic in terms of imbalance and back pain.  She was brought to the operating room electively.  OPERATIVE TECHNIQUE:  The patient underwent wire localization at the [facility] today.  The wire entered far laterally, almost in the midaxillary line and was directed medially to the breast tumor which was deep within the breast at about the 12 o'clock position.   The patient was brought to the holding area at [facility] and radionuclide was injected into the right breast by the Nuclear Medicine technician.  The patient was taken to the operating room where general anesthesia was induced.  A time-out was held identifying correct patient, correct procedure, and correct site.  Following an alcohol prep, I injected 5 mL of blue dye in the right retroareolar area.  This was 2 mL of methylene blue mixed with 3 mL of saline.  The breast was massaged for 5 minutes.  We then prepped and draped the right breast, chest wall, and axilla.  Intravenous antibiotics were given.  The best incision I could perform was a radially oriented linear incision at about the 9 o'clock position.  This was brought down near the wire.  Dissection was carried down into the breast tissue.  The wire was pulled through the skin and incorporated into the operative field. I took the dissection medially, and as I went medially, I went posteriorly and around the wire, and actually took a very large specimen.  This was marked with the 6-color margin marker kit.  Specimen mammogram was obtained and it showed the marker where the cancer is within the specimen, but with Dr. Tilda Burrow felt it was only about 1 cm from  the edge.  This looked like the medial edge, so I reexcised about 1 cm margin on the entire medial edge and marked it again with the color margin marker kit and sent that separately as a specimen labeled additional medial margin.  Hemostasis in the lumpectomy site was excellent and achieved with electrocautery.  The wound was actually quite large.  This was irrigated out and closed in multiple layers with interrupted sutures of 3-0 Vicryl, and for the skin I used the running subcuticular suture of 4-0 Monocryl and Steri-Strips.  A transverse incision was made in the right axilla at the hairline. Dissection was carried down through the subcutaneous tissue.  Using  the Neoprobe, I was able to identify the area of increased activity and incised the clavipectoral fascia.  I dissected into the axilla and found 2 very hot, very blue lymph nodes.  After these were removed, there was really no more radioactivity other than background and so we had 2 sentinel lymph nodes.  These were sent for routine histology. Hemostasis in the axilla was excellent and achieved with electrocautery. The wound was irrigated with saline.  The deeper tissues in the axilla were closed with interrupted 3-0 Vicryl sutures and the skin closed with running subcuticular suture of 4-0 Monocryl and Steri-Strips.  Bulky clean bandages and a breast binder were placed.  The patient tolerated the procedure well and was taken to recovery room in stable condition. Estimated blood loss was about 25-30 mL.  Complications none.  Sponge, needle, and instrument counts were correct.     Angelia Mould. [name], M.D.     HMI/MEDQ  D:  02/08/2011  T:  02/09/2011  Job:  [id number]  cc:   Holley Bouche, M.D. Ritta Slot, MD [facility]  Electronically Signed by Claud Kelp M.D. on 02/09/2011 01:21:29 PM

## 2011-02-23 NOTE — Discharge Summary (Signed)
NAMEROE, KOFFMAN                ACCOUNT NO.:  [id number]  MEDICAL RECORD NO.:  [id number]           PATIENT TYPE:  I  LOCATION:  5152                         FACILITY:  [facility]  PHYSICIAN:  Angelia Mould. [name], M.D.DATE OF BIRTH:  [birth year]  DATE OF ADMISSION:  02/08/2011 DATE OF DISCHARGE:  02/09/2011                              DISCHARGE SUMMARY   FINAL DIAGNOSES: 1. Invasive ductal carcinoma right breast, 12 o'clock position,     pathologic stage T1b, N0, estrogen receptor 100% positive,     progesterone receptor 100% positive, KI-67 8%, HER-2/neu negative. 2. Sleep apnea. 3. Asthma. 4. Degenerative joint disease, severe. 5. Paroxysmal atrial fibrillation on Coumadin. 6. Type 2 diabetes mellitus. 7. Morbid obesity with weight of 300 pounds at a height of 5 feet 0     inches  OPERATIONS PERFORMED:  Right partial mastectomy, re-excision of medial margin, right axillary sentinel node biopsy, date February 08, 2011.  HISTORY:  This is a 70 year old Caucasian female with multiple medical problems as listed above with some disability due to that.  She is referred to me by Dr. Rogelia Mire at [facility] after recent mammograms showed abnormalities.  They actually saw a cyst in the left breast and in the right breast at 12 o'clock position there was a mass that was biopsied stereotactically.  This showed fragments of invasive mammary carcinoma which was receptor positive and HER-2 negative.  MRI showed no abnormality on the left.  She was seen by me and counseled as an outpatient.  She was felt to be a candidate for breast conservation.  All of her options were discussed and she would like to try breast conservation if possible.  This was most appropriate considering the small size of her tumor and the very large size of her breast.  Discussion and referral was made to Dr. Lynnea Ferrier, her cardiologist, at [facility] and he felt that it would  be reasonable to take her off her Coumadin for a few days and proceed with surgery.  She was otherwise stable from a cardiac standpoint.  HOSPITAL COURSE:  On the day of admission, the patient was taken to the operating room and underwent right partial mastectomy with needle localization, re-excision of additional medial margin, and right axillary sentinel node mapping and biopsy.  The surgery went well. Anesthesia was somewhat concerned about her sleep apnea, asthma, and deconditioned state, and requested that she be hospitalized overnight for monitoring of her sleep apnea and respirations.  I felt that was reasonable.  The following morning, the patient was doing well and was awake and alert and having no respiratory problems.  Her right breast and right axillary wounds looked good, and there was no sign of any bleeding or hematoma.  Final pathology report shows that the sentinel nodes are negative and that there was no residual tumor within the breast.  The previous biopsy site was identified.  There was no atypia or malignancy present within the residual breast or in the additional medial margin.  The tumor was therefore read out as a stage T1b, N0, receptor positive, HER-2  negative tumor.  This can be discussed with the patient as an outpatient.  DISCHARGE MEDICATIONS:  A prescription for Vicodin for pain.  She was told to continue her usual home medications which include: 1. Tylenol. 2. Diltiazem 360 mg daily. 3. Lisinopril 20 mg twice daily. 4. Metoprolol tartrate 25 mg twice daily. 5. Stool softener. 6. Fish oil. 7. Vitamin C. 8. Fexofenadine 180 mg daily as needed. 9. Metformin 500 mg twice daily. 10.Coumadin 4 mg one and a half tablets daily. 11.Bumetanide 1 mg daily. 12.Simvastatin 40 mg daily. 13.Theophylline XR 200 mg twice daily. 14.Potassium chloride 20 mEq twice daily. 15.Multivitamins daily.  The patient will be referred to Medical Oncology and Radiation  Oncology as an outpatient.     Angelia Mould. [name], M.D.     HMI/MEDQ  D:  02/21/2011  T:  02/21/2011  Job:  [id number]  cc:   Holley Bouche, M.D. [facility]. Ritta Slot, MD Ollen Gross, M.D.  Electronically Signed by Claud Kelp M.D. on 02/23/2011 09:24:05 AM

## 2011-02-24 ENCOUNTER — Encounter (HOSPITAL_BASED_OUTPATIENT_CLINIC_OR_DEPARTMENT_OTHER): Payer: Medicare Other | Admitting: Oncology

## 2011-02-24 DIAGNOSIS — I4891 Unspecified atrial fibrillation: Secondary | ICD-10-CM

## 2011-02-24 DIAGNOSIS — Z79899 Other long term (current) drug therapy: Secondary | ICD-10-CM

## 2011-02-24 DIAGNOSIS — E119 Type 2 diabetes mellitus without complications: Secondary | ICD-10-CM

## 2011-02-24 DIAGNOSIS — C50419 Malignant neoplasm of upper-outer quadrant of unspecified female breast: Secondary | ICD-10-CM

## 2011-03-01 LAB — BASIC METABOLIC PANEL
BUN: 7 mg/dL (ref 6–23)
CO2: 30 mEq/L (ref 19–32)
CO2: 32 mEq/L (ref 19–32)
Chloride: 98 mEq/L (ref 96–112)
Chloride: 99 mEq/L (ref 96–112)
Chloride: 99 mEq/L (ref 96–112)
GFR calc Af Amer: >60 mL/min (ref >60)
GFR calc Af Amer: >60 mL/min (ref >60)
GFR calc non Af Amer: >60 mL/min (ref >60)
GFR calc non Af Amer: >60 mL/min (ref >60)
Glucose, Bld: 201 mg/dL — ABNORMAL HIGH (ref 70–99)
Potassium: 3.9 mEq/L (ref 3.5–5.1)
Potassium: 4.2 mEq/L (ref 3.5–5.1)
Potassium: 4.9 mEq/L (ref 3.5–5.1)
Sodium: 135 mEq/L (ref 135–145)

## 2011-03-01 LAB — DIFFERENTIAL
Basophils Relative: 0 % (ref 0–1)
Eosinophils Absolute: 0.1 10*3/uL (ref 0.0–0.7)
Eosinophils Relative: 1 % (ref 0–5)
Lymphs Abs: 0.8 10*3/uL (ref 0.7–4.0)
Monocytes Absolute: 1.1 10*3/uL — ABNORMAL HIGH (ref 0.1–1.0)
Monocytes Relative: 7 % (ref 3–12)

## 2011-03-01 LAB — URINE CULTURE

## 2011-03-01 LAB — GLUCOSE, CAPILLARY
Glucose-Capillary: 186 mg/dL — ABNORMAL HIGH (ref 70–99)
Glucose-Capillary: 187 mg/dL — ABNORMAL HIGH (ref 70–99)
Glucose-Capillary: 223 mg/dL — ABNORMAL HIGH (ref 70–99)
Glucose-Capillary: 229 mg/dL — ABNORMAL HIGH (ref 70–99)
Glucose-Capillary: 253 mg/dL — ABNORMAL HIGH (ref 70–99)
Glucose-Capillary: 255 mg/dL — ABNORMAL HIGH (ref 70–99)
Glucose-Capillary: 256 mg/dL — ABNORMAL HIGH (ref 70–99)
Glucose-Capillary: 261 mg/dL — ABNORMAL HIGH (ref 70–99)
Glucose-Capillary: 261 mg/dL — ABNORMAL HIGH (ref 70–99)
Glucose-Capillary: 268 mg/dL — ABNORMAL HIGH (ref 70–99)

## 2011-03-01 LAB — CBC
HCT: 29.2 % — ABNORMAL LOW (ref 36.0–46.0)
HCT: 30.3 % — ABNORMAL LOW (ref 36.0–46.0)
HCT: 32.2 % — ABNORMAL LOW (ref 36.0–46.0)
HCT: 32.7 % — ABNORMAL LOW (ref 36.0–46.0)
Hemoglobin: 10.1 g/dL — ABNORMAL LOW (ref 12.0–15.0)
Hemoglobin: 10.9 g/dL — ABNORMAL LOW (ref 12.0–15.0)
Hemoglobin: 9.5 g/dL — ABNORMAL LOW (ref 12.0–15.0)
MCHC: 32.5 g/dL (ref 30.0–36.0)
MCHC: 33.5 g/dL (ref 30.0–36.0)
MCHC: 33.5 g/dL (ref 30.0–36.0)
MCV: 81.4 fL (ref 78.0–100.0)
MCV: 81.7 fL (ref 78.0–100.0)
MCV: 82.4 fL (ref 78.0–100.0)
Platelets: 212 10*3/uL (ref 150–400)
Platelets: 213 10*3/uL (ref 150–400)
Platelets: 256 10*3/uL (ref 150–400)
RBC: 3.55 MIL/uL — ABNORMAL LOW (ref 3.87–5.11)
RBC: 3.95 MIL/uL (ref 3.87–5.11)
RDW: 16.1 % — ABNORMAL HIGH (ref 11.5–15.5)
RDW: 16.1 % — ABNORMAL HIGH (ref 11.5–15.5)
WBC: 12.4 10*3/uL — ABNORMAL HIGH (ref 4.0–10.5)
WBC: 14.9 10*3/uL — ABNORMAL HIGH (ref 4.0–10.5)

## 2011-03-01 LAB — PROTIME-INR
INR: 1.11 (ref 0.00–1.49)
INR: 2.37 — ABNORMAL HIGH (ref 0.00–1.49)
Prothrombin Time: 14.2 seconds (ref 11.6–15.2)
Prothrombin Time: 19.1 seconds — ABNORMAL HIGH (ref 11.6–15.2)

## 2011-03-01 LAB — URINALYSIS, ROUTINE W REFLEX MICROSCOPIC
Bilirubin Urine: NEGATIVE
Glucose, UA: NEGATIVE mg/dL
Hgb urine dipstick: NEGATIVE
Specific Gravity, Urine: 1.022 (ref 1.005–1.030)
Urobilinogen, UA: 1 mg/dL (ref 0.0–1.0)
pH: 5.5 (ref 5.0–8.0)

## 2011-03-01 LAB — KETONES, QUALITATIVE: Acetone, Bld: NEGATIVE

## 2011-03-01 LAB — URINE MICROSCOPIC-ADD ON

## 2011-03-01 LAB — TYPE AND SCREEN: Antibody Screen: NEGATIVE

## 2011-03-02 LAB — COMPREHENSIVE METABOLIC PANEL
ALT: 14 U/L (ref 0–35)
AST: 21 U/L (ref 0–37)
CO2: 28 mEq/L (ref 19–32)
Chloride: 103 mEq/L (ref 96–112)
GFR calc Af Amer: >60 mL/min (ref >60)
GFR calc non Af Amer: >60 mL/min (ref >60)
Glucose, Bld: 208 mg/dL — ABNORMAL HIGH (ref 70–99)
Sodium: 138 mEq/L (ref 135–145)
Total Bilirubin: 0.9 mg/dL (ref 0.3–1.2)

## 2011-03-02 LAB — URINALYSIS, ROUTINE W REFLEX MICROSCOPIC
Bilirubin Urine: NEGATIVE
Glucose, UA: NEGATIVE mg/dL
Hgb urine dipstick: NEGATIVE
Ketones, ur: NEGATIVE mg/dL
pH: 5 (ref 5.0–8.0)

## 2011-03-02 LAB — CBC
Hemoglobin: 13.4 g/dL (ref 12.0–15.0)
MCV: 80.7 fL (ref 78.0–100.0)
RBC: 4.92 MIL/uL (ref 3.87–5.11)
WBC: 8.5 10*3/uL (ref 4.0–10.5)

## 2011-03-04 ENCOUNTER — Encounter: Payer: Medicare Other | Admitting: Genetic Counselor

## 2011-03-09 ENCOUNTER — Ambulatory Visit: Payer: Medicare Other | Attending: Radiation Oncology | Admitting: Radiation Oncology

## 2011-03-09 DIAGNOSIS — C50919 Malignant neoplasm of unspecified site of unspecified female breast: Secondary | ICD-10-CM | POA: Insufficient documentation

## 2011-03-09 DIAGNOSIS — Z801 Family history of malignant neoplasm of trachea, bronchus and lung: Secondary | ICD-10-CM | POA: Insufficient documentation

## 2011-03-09 DIAGNOSIS — Z8049 Family history of malignant neoplasm of other genital organs: Secondary | ICD-10-CM | POA: Insufficient documentation

## 2011-03-09 DIAGNOSIS — I4891 Unspecified atrial fibrillation: Secondary | ICD-10-CM | POA: Insufficient documentation

## 2011-03-09 DIAGNOSIS — E119 Type 2 diabetes mellitus without complications: Secondary | ICD-10-CM | POA: Insufficient documentation

## 2011-03-09 DIAGNOSIS — Z8542 Personal history of malignant neoplasm of other parts of uterus: Secondary | ICD-10-CM | POA: Insufficient documentation

## 2011-03-09 DIAGNOSIS — Z510 Encounter for antineoplastic radiation therapy: Secondary | ICD-10-CM | POA: Insufficient documentation

## 2011-03-09 DIAGNOSIS — Z170 Estrogen receptor positive status [ER+]: Secondary | ICD-10-CM | POA: Insufficient documentation

## 2011-03-09 DIAGNOSIS — Z90710 Acquired absence of both cervix and uterus: Secondary | ICD-10-CM | POA: Insufficient documentation

## 2011-03-09 DIAGNOSIS — J45909 Unspecified asthma, uncomplicated: Secondary | ICD-10-CM | POA: Insufficient documentation

## 2011-03-09 DIAGNOSIS — Z79899 Other long term (current) drug therapy: Secondary | ICD-10-CM | POA: Insufficient documentation

## 2011-03-09 DIAGNOSIS — Z96659 Presence of unspecified artificial knee joint: Secondary | ICD-10-CM | POA: Insufficient documentation

## 2011-03-09 DIAGNOSIS — I10 Essential (primary) hypertension: Secondary | ICD-10-CM | POA: Insufficient documentation

## 2011-03-09 DIAGNOSIS — Z800 Family history of malignant neoplasm of digestive organs: Secondary | ICD-10-CM | POA: Insufficient documentation

## 2011-03-21 ENCOUNTER — Inpatient Hospital Stay (HOSPITAL_COMMUNITY): Admission: RE | Admit: 2011-03-21 | Payer: Medicare Other | Source: Ambulatory Visit | Admitting: Orthopedic Surgery

## 2011-04-11 ENCOUNTER — Other Ambulatory Visit: Payer: Self-pay | Admitting: Oncology

## 2011-04-11 ENCOUNTER — Encounter (HOSPITAL_BASED_OUTPATIENT_CLINIC_OR_DEPARTMENT_OTHER): Payer: Medicare Other | Admitting: Oncology

## 2011-04-11 DIAGNOSIS — C50919 Malignant neoplasm of unspecified site of unspecified female breast: Secondary | ICD-10-CM

## 2011-04-11 DIAGNOSIS — E119 Type 2 diabetes mellitus without complications: Secondary | ICD-10-CM

## 2011-04-11 DIAGNOSIS — C50419 Malignant neoplasm of upper-outer quadrant of unspecified female breast: Secondary | ICD-10-CM

## 2011-04-11 DIAGNOSIS — C50119 Malignant neoplasm of central portion of unspecified female breast: Secondary | ICD-10-CM

## 2011-04-11 LAB — CBC WITH DIFFERENTIAL/PLATELET
Eosinophils Absolute: 0.2 10*3/uL (ref 0.0–0.5)
HCT: 38.7 % (ref 34.8–46.6)
HGB: 13.1 g/dL (ref 11.6–15.9)
LYMPH%: 17.1 % (ref 14.0–49.7)
MONO#: 0.5 10*3/uL (ref 0.1–0.9)
NEUT#: 5.2 10*3/uL (ref 1.5–6.5)
NEUT%: 71.8 % (ref 38.4–76.8)
Platelets: 230 10*3/uL (ref 145–400)
RBC: 4.65 10*6/uL (ref 3.70–5.45)
WBC: 7.2 10*3/uL (ref 3.9–10.3)

## 2011-04-11 LAB — COMPREHENSIVE METABOLIC PANEL
CO2: 25 mEq/L (ref 19–32)
Glucose, Bld: 187 mg/dL — ABNORMAL HIGH (ref 70–99)
Sodium: 139 mEq/L (ref 135–145)
Total Bilirubin: 0.5 mg/dL (ref 0.3–1.2)
Total Protein: 6.7 g/dL (ref 6.0–8.3)

## 2011-04-20 ENCOUNTER — Encounter (INDEPENDENT_AMBULATORY_CARE_PROVIDER_SITE_OTHER): Payer: Self-pay | Admitting: General Surgery

## 2011-04-26 NOTE — Assessment & Plan Note (Signed)
[facility]                             PULMONARY OFFICE NOTE   NAME:MYERSLizbett, [name]                       MRN:          [id number]  DATE:05/29/2007                            DOB:          [birth year]    PROBLEM LIST:  1. Asthmatic bronchitis.  2. Morbid obesity.  3. Hypoventilation.  4. Obstructive sleep apnea.  5. Sarcoid.  6. Diabetes.  7. Atrial fibrillation/Coumadin.  8. Resection of uterine cancer.   HISTORY:  She feels she is doing quite well with no acute respiratory  problems.  There have been no issues in followup of her GYN surgery.  She has settled in and is very pleased, now, with her CPAP at 9 CWP  through [facility].  It definitely improves her quality of life.  A  little, minor, hacking cough is chronic, but does not concern her, has  not changed, and is not productive.  Allowing for her level of fitness,  she is not concerned about shortness of breath.   MEDICATIONS:  1. Advair 250/50.  2. Glucophage XR 500 mg b.i.d.  3. CPAP at 9 CWP.  4. Furosemide 20 mg.  5. Theophylline 200 mg b.i.d.  6. Vitamins and calcium.  7. Coumadin.  8. Tarka 240 mg.  9. Potassium.  10.Albuterol inhaler p.r.n. use.  11.Benzonatate Perles.  12.Home nebulizer with albuterol, rarely needed.   No medication allergy.   OBJECTIVE:  She has lost a couple of pounds, but still extremely heavy  at 292 pounds.  BP 132/64, pulse 66.  Room air saturation 95%.  She is morbidly obese.  CHEST:  Clear but shallow, breathing is not labored.  Voice quality is  normal with no stridor.  Heart sounds are regular without murmur.  There does not seem to be ankle edema.   IMPRESSION:  Stable obstructive sleep apnea on continuous positive  airway pressure and asthma, exogenous obesity.   PLAN:  Chest x-ray.  Schedule return 1 year, earlier p.r.n.     Clinton D. Maple Hudson, MD, Tonny Bollman, FACP  Electronically Signed    CDY/MedQ  DD: 05/29/2007  DT: 05/30/2007  Job #:  [id number]   cc:   Holley Bouche, M.D.

## 2011-04-26 NOTE — Letter (Signed)
January 11, 2009    Attention, Lianne Bushy to fax [phone].   RE:  BERNETA, SCONYERS  MRN:  [id number]  /  DOB:  05/28/1941   To whom it may concern:   [name] is under my medical care.  She has requested this letter  documenting that she needs a more accessible apartment because of her  medical problems.  She is limited by her body habitus and shortness of  breath on exertion, such that she has significant difficulty climbing  steps and walking long distances.   Thank you for your assistance.    Sincerely,      Clinton D. Maple Hudson, MD, Tonny Bollman, FACP  Electronically Signed    CDY/MedQ  DD: 01/11/2009  DT: 01/11/2009  Job #: [id number]

## 2011-04-29 NOTE — Op Note (Signed)
[place]  Patient:    [name], [name]                       MRN: [id number] Proc. Date: 01/19/00 Adm. Date:  [id number] Attending:  Tressie Stalker CC:         Clinton D. Maple Hudson, M.D.             Willis Modena. Dreiling, M.D.             CVTS office                           Operative Report  PREOPERATIVE DIAGNOSIS:  Mediastinal lymphadenopathy.  POSTOPERATIVE DIAGNOSIS:  Mediastinal lymphadenopathy.  PROCEDURE: 1. Flexible bronchoscopy. 2. Cervical mediastinoscopy.  SURGEON:  Salvatore Decent. Cornelius Moras, M.D.  ANESTHESIA:  General.  BRIEF CLINICAL NOTE:  The patient is a 70 year old morbidly obese white female followed by Willis Modena. Dreiling, M.D., and referred by [facility] D. Young, M.D. for diagnostic mediastinoscopy.  The patient has a long history of reactive airway disease and exertional shortness of breath which has increased recently.  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  The patient and her family are counseled at length regarding the indications and potential benefits of surgery.  They accept all associated risks of surgery including, but not limited to risks of death, bleeding requiring urgent thoracotomy, myocardial infarction, respiratory distress, or pneumonia, pneumothorax, injury to nerves, ________ the vocal cords.  They accept these risks, as well as any unforeseen complications, and agreed to proceed as described.  DESCRIPTION OF PROCEDURE:  The patient was brought to the operating room on the  above mentioned date, and placed in the supine position on the operating table.  General endotracheal anesthesia is induced under the care and direction of Guadalupe Maple, M.D.  Flexible bronchoscopy performed through the existing endotracheal  tube.  A 5 mm bronchoscope was passed through the endotracheal tube and advanced. The distal trachea, carina, and both  left and right endobronchial trees are carefully visualized.  There is mild swelling involving all of the major airways. There is minimal associated erythema.  There is no purulent exudate identified. No endobronchial lesions are identified.  There is normal anatomical arrangement. The bronchoscope was removed uneventfully.  The patient was positioned with the neck gently extended and a roll between the  shoulders.  The patients anterior neck and chest are prepared and draped in a sterile manner.  A small transverse incision is made approximately two fingerbreadths above the sternal notch in the anterior neck.  The incision was completed through the subcutaneous tissues and the platysmal muscle with electrocautery.  The strap muscles are divided in the midline.  The pretracheal  fascia was identified just below the level of the thyroid isthmus.  The pretracheal space is developed sharply initially, and subsequently bluntly down into the anterior mediastinum.  A rigid mediastinoscope was passed under direct vision into the anterior mediastinum.  Routine visual exploration is performed.  There are several large lymph nodes immediately available and in the right lower paratracheal groove.  One of these lymph nodes is carefully dissected free from associated structures, and removed uneventfully.  The lymph node is divided on the table and examined.  It has a characteristic appearance suggestive of sarcoidosis.  A portion sent to pathology for frozen section evaluation.  The remainder of the specimen  is divided to a small portion sent for culture and sensitivity including routine, fungal, AFB, and viral cultures.  The remainder is sent to pathology for final permanent sections.  Preliminary frozen section pathology results demonstrates benign-appearing, _____ granulomatous inflammation consistent with palpable sarcoidosis.  The wound was irrigated with saline solution and inspected  for hemostasis.  The  wound was packed with dry sterile gauze on several occasions, and inspected for  hemostasis.  The scope was removed uneventfully.  The incision is closed using running 3-0 Vicryl sutures to reapproximate the platysmal muscle and subcutaneous tissues.  The skin was closed with a running 4-0 Monocryl subcuticular skin closure.  A dry sterile dressing is applied.  The patient tolerated the procedure well, is extubated in the operating room, and transported to the recovery room in stable condition.  There were no intraoperative complications.  All sponge, instrument, and needle counts were verified correct at the completion of the operation. DD:  01/19/00 TD:  01/20/00 Job: [id number]

## 2011-04-29 NOTE — H&P (Signed)
[place]. [name]  Patient:    [name], [name]                       MRN: [id number] Adm. Date:  [id number] Attending:  Young, Copy CC:         Willis Modena. Dreiling, M.D.                         History and Physical  ADMITTING DIAGNOSES: 1. Asthmatic bronchitis with exacerbation. 2. Obesity hypoventilation syndrome. 3. Obstructive sleep apnea. 4. Sarcoidosis. 5. Anxiety with depression. 6. Morbid obesity. 7. History of hypertension.  HISTORY OF PRESENT ILLNESS:  A 70 year old white female nonsmoker with sarcoidosis diagnosed by mediastinoscopy (Dr. Cornelius Moras) on January 19, 2000.  Prednisone therapy was started on January 26, 2000 at 40 mg daily and has now tapered down to 20 g daily.  History of adult-onset asthma.  She has had coarse wheezing over the last two weeks, without fever or purulent sputum, and has been significantly dyspneic with trivial activity.  Her son called me this evening at the close of office time, very upset that Mother was not better, and anxious that something be done. Possibility of university referral was brought up, but without specific goal, best approach was felt to be a local admission this evening for stabilization and review of status.  She reports that she is not worse, not different in the past week, ut disappointed that she does not feel any better with initiation of prednisone.  ALLERGIC:  IBUPROFEN said to cause shortness of breath without history of nasal  polyps.  REVIEW OF Larson:  Dyspneic walking across the room.  Minimal physical activity. Props up some to sleep.  Occasional twinge pain at sternum since mediastinoscopy, no other chest pain of any sort.  Chest has felt tight.  Cough produces scant white sputum.  She denies headache, fever, chills, rash, retinopathy, arthralgias, nausea or vomiting, dysuria, or any blood.  She denies leg pain or swelling.  Family reports mood change associated  with loss of her job recently at [employer].  Youngest son, who is significant support, has left to get married.  PAST MEDICAL HISTORY:  Adult-onset asthma times many years.  Sarcoidosis with ACE level elevated at 87, mediastinoscopy positive for noncaseating granulomas, cultures negative for AFB.  Bronchoscopy negative in February 2001.  CT scan of the chest on December 26 negative for pulmonary embolism, positive for interstitial  lung disease, significant hilar and mediastinal adenopathy.  Bronchoscopy negative December 2000.  Echocardiogram on January 11, 2000 showed ejection fraction 60%, normal left atrium, left ventricle, right atrium, and right ventricle, with normal valves, but technically limited by her obesity.  Dopplers of leg veins reported by the patient to have been negative in February, without report available to confirm. Past history of varices of the legs, leg fracture, but no history of deep vein thrombosis or pulmonary embolism.  History of obstructive sleep apnea with empiric CPAP.  She does not know the setting, but has found CPAP significantly helpful.  Hypertension.  Tubal ligation.  SOCIAL HISTORY:  Widowed.  Recently lost her job at [employer].  Two  grown children.  No ethanol, no cigarettes.  FAMILY HISTORY:  Mother with deep vein thrombosis and varices of the legs.  HOME MEDICATIONS: 1. CPAP at uncertain setting. 2. Nebulizer with albuterol. 3. Flovent 220 2 puffs b.i.d. 4. Serevent 2 puffs b.i.d. 5.  Albuterol and Atrovent 2 puffs q.i.d. p.r.n. 6. Singulair 10 mg q.d. 7. Tranxene 3.75 mg q.d.  PHYSICAL EXAMINATION:  VITAL SIGNS:  Temperature 96.6, heart rate 76 and regular, respirations 28, blood pressure 147/90, weight 285.9.  GENERAL APPEARANCE:  Morbidly-obese white woman, oriented, not in evident acute  distress.  SKIN:  Clear.  ADENOPATHY:  None found.  HEENT:  PERRLA, EOMs intact, atraumatic.  Gross vision and  hearing intact. Tongue protrudes midline.  Oral mucosa is clear.  NECK:  Veins not distended.  No thyromegaly or stridor.  HEART:  Regular rhythm, normal S1, S2, no murmur or gallop.  P2 is not increased.  LUNGS:  Coarsely congested, nonproductive cough.  No wheeze.  Not using accessory respiratory muscles.  No dullness.  ABDOMEN:  Soft without hepatosplenomegaly, nontender.  Faint bowel sounds are present.  PELVIC AND RECTAL:  Not done at this time.  EXTREMITIES:  Superficial varices, soft, nonpitting, negative Homans.  LABORATORY DATA:  Chest x-ray reviewed with Dr. Karin Golden.  Normal heart size, no acute process, no significant change from recent film.  IMPRESSION: 1. Asthmatic bronchitis with exacerbation.  Family feels there has been a    significant change.  We had a frank discussion, and it is not clear how much    what they are reacting to is her anxiety, possibly triggered by prednisone,    but we are going to double-check and make sure there is no evidence of heart    failure, pulmonary embolism, or pneumonia. 2. Obesity hypoventilation syndrome.  Will have to be managed over long-term with    dietary and weight-loss management by her primary physician. 3. Obstructive sleep apnea.  Can be addressed with CPAP but will use BiPAP until    family brings the machine in. 4. Her sarcoidosis will be treated with continuation of steroid therapy, and we    will switch her back to prednisone when we can be sure she is stable. 5. Issue of depression may require outpatient counseling and specific therapy. I    will provide minimal doses of Xanax short-term during this observation    period.  The family has been hopeful that there is an "answer" and have raised the issue of university referral.  I will facilitate that if we can identify a specific goal or purpose; otherwise, we will simply try to make sure that there are no acute issues of medical instability during this  hospitalization, and return her to outpatient follow-up as quickly as possible. DD:  02/11/00 TD:  02/14/00 Job: [id number]

## 2011-04-29 NOTE — Procedures (Signed)
[place]. [facility]  Patient:    [name]                        MRN: [id number] Proc. Date: 12/15/99 Adm. Date:  [id number] Attending:  Young, Copy CC:         Willis Modena. Dreiling, M.D.                           Procedure Report  DATE OF BIRTH:  February 27, 1941  PROCEDURE PERFORMED:  Bronchoscopy.  OPERATOR:  Clinton D. Maple Hudson, M.D.  INDICATIONS FOR PROCEDURE:  The patient is a 70 year old nonsmoker with gradually increasing exertional dyspnea over the last six months, dry cough and CT scan showing extensive mediastinal and hilar adenopathy with interstitial lung disease and multiple tiny nodules.  There were also some areas of focal pleural thickening. The differential diagnosis at this point includes sarcoid, lymphoma, or metastatic disease.  The nodules are too small to biopsy individually.  PHYSICAL EXAMINATION:  Preoperative exam revealed blood pressure 144/87, weight 300 pounds.  Pulse 60 per minute.  Room air saturation 95%.  Dry cough.  Normal heart sounds.  No adenopathy, wheeze, rhonchi or neck vein distention.  Wearing elastic stockings.  MEDICATIONS:  FloVent 110, Serevent and Proventil inhalers, Singulair, calcium.  Pulmonary function tests have shown severe restrictive and obstructive defects reflecting her morbid obesity.  Room air oxygen saturations had shown 95% as noted. She was considered stable for bronchscopy.  DESCRIPTION OF PROCEDURE:  After fully informed consent, bronchoscopy was performed on an outpatient basis in the endoscopy suite.  Premedication was with Demerol nd atropine.  The upper airway was then anesthetized topically with 1% Xylocaine. A cumulative dose of 4 mg of intravenous Versed was given for additional sedation and cough control.  Oxygen was provided at 8L per minute by nasal prongs holding saturations over 92% and cardiac monitor showed normal sinus rhythm.  An Olympus fiberoptic  bronchoscope was advanced via the right nostril to the level of the vocal cords without difficulty.  The cords moved normally.  Cough was moderate.  The trachea and main carina were unremarkable.  Sequential examination of each lobar and segmental airway bilaterally to the fourth division level revealed normal anatomy.  There was mild bronchial erythema in the dependent airways bilaterally. Secretions were clear and mucoid.  Under fluoroscopic guidance, the bronchoscope was directed into the right lung where brushing and then standard transbronchial lung biopsies were performed in the right middle lobe and right upper lobe. There was trivial self-limited bleeding and no other complication pending chest x-ray. The patient tolerated the procedure quite well and is being held until stable before release home to planned office follow-up in two weeks.  IMPRESSION:  Diffuse interstitial lung disease and mediastinal adenopathy most onw sarcoidosis at this point. DD:  12/15/99 TD:  12/15/99 Job: [name]

## 2011-04-29 NOTE — H&P (Signed)
NAMECIIN, BRAZZEL NO.:  [id number]   MEDICAL RECORD NO.:  [id number]          PATIENT TYPE:  AMB   LOCATION:  SDC                           FACILITY:  [facility]   PHYSICIAN:  Janine Limbo, M.D.DATE OF BIRTH:  08/01/1941   DATE OF ADMISSION:  01/01/2005  DATE OF DISCHARGE:                                HISTORY & PHYSICAL   REASON FOR ADMISSION:  Ms. Barnhardt is a 70 year old female, para 1-1-0-2, who  presents for hysteroscopy, dilatation and curettage, and conization of the  cervix.   HISTORY OF PRESENT ILLNESS:  The patient has been followed at the [facility] and [facility]cology division of [facility].  The patient had a Pap smear in November of 2005 that showed a  glandular cell abnormality.  Colposcopy was performed and the transition  zone could not be completely seen.  Biopsies of the cervix were benign.  The  patient has a past history of a tubal ligation.   OBSTETRICAL HISTORY:  The patient has had a preterm vaginal delivery and a  term vaginal delivery.   PAST MEDICAL HISTORY:  The patient has a complicated past medical history  which includes diabetes, asthma, sarcoidosis, ostial neurosis, obesity, and  an umbilical hernia.   ALLERGIES:  The patient reports that she is allergic to IBUPROFEN.   SOCIAL HISTORY:  The patient denies cigarette use, alcohol use, and  recreational drug use.   REVIEW OF SYMPTOMS:  Noncontributory.   FAMILY HISTORY:  The patient's mother had heart disease and a history of  venous thrombosis.  The patient's father had colon cancer and hypertension.   PHYSICAL EXAMINATION:  GENERAL:  Weight is 285 pounds.  HEENT:  Within normal limits.  CHEST:  Some rhonchi.  HEART:  Regular rate and rhythm.  BREASTS:  Without masses.  ABDOMEN:  Markedly obese and there is a umbilical hernia present.  EXTREMITIES:  Obese.  NEUROLOGICAL:  Grossly normal.  PELVIC:  External genitalia is normal.  The  vagina is atrophic and there is  a cystocele and rectocele present.  Cervix is nontender.  Uterus is normal  size and shape, as best we can tell through the obese abdomen.  Adnexae no  masses.  Rectovaginal confirms.   ASSESSMENT:  1.  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  2.  Inadequate colposcopy.  3.  Marked obesity.  4.  Multiple medical problems.   PLAN:  The patient will undergo hysteroscopy with dilatation and curettage  and colonization of the cervix.  She understands the indications for her  surgical procedure and she accepts the risks of, but not limited to,  anesthetic complications, bleeding, infection, and possible damage to the  surrounding organs.      AVS/MEDQ  D:  01/11/2005  T:  01/11/2005  Job:  [id number]   cc:   Holley Bouche, M.D.  [address]  [place], [place] [address]  Fax: [phone]   Clinton D. Maple Hudson, M.D.

## 2011-04-29 NOTE — Assessment & Plan Note (Signed)
[facility]                             PULMONARY OFFICE NOTE   NAME:[name], [name]                       MRN:          [id number]  DATE:11/30/2006                            DOB:          Apr 18, 1941    PROBLEM:  1. Asthmatic bronchitis.  2. Morbid obesity.  3  Hypoventilation.  1. Obstructive sleep apnea.  2. Sarcoid.  3. Diabetes.  4. Atrial fibrillation/Coumadin.  5. Resection of gynecologic cancer.   HISTORY:  She is enthusiastic about early progress on a new diet,  estimating that she has lost 18 pounds so far.  This diet involves a lot  of soups.  She finds she is sleepier in the day, but sleeping well at  night with her CPAP at 8 CWP.  I discussed the possibility that her  sleepiness was a metabolic change related to her diet, but we agreed to  try increasing her CPAP 1 step to see what effect that had.   MEDICATIONS:  1. Advair 250/50.  2. Glucophage XR 500 mg b.i.d.  3. CPAP currently at 8 CWP.  4. Furosemide 20 mg.  5. Theophylline 200 mg b.i.d.  6. Coumadin.  7. Albuterol inhaler.   No medication allergy.   OBJECTIVE:  Weight is down to 282 pounds, still quite heavy.  Blood  pressure 106/72.  Pulse 72.  Room air saturation 95%.  LUNGS:  Fields are quiet and clear.  She remains morbidly obese.  Pulse is slightly irregular.  There is no edema.   IMPRESSION:  Non-specific sense of tiredness, might reflect metabolic  changes of dieting.  I am less confident that it is related to her sleep  apnea, but we are going to try a pressure change in her CPAP.   PLAN:  [facility] will increase CPAP to 9 CWP.  Schedule a return  here in 6 months, earlier p.r.n.     Clinton D. Maple Hudson, MD, Tonny Bollman, FACP  Electronically Signed    CDY/MedQ  DD: 11/30/2006  DT: 12/01/2006  Job #: [id number]   cc:   Holley Bouche, M.D.

## 2011-04-29 NOTE — Op Note (Signed)
[name], [name]                ACCOUNT NO.:  [id number]   MEDICAL RECORD NO.:  [id number]          PATIENT TYPE:  AMB   LOCATION:  SDC                           FACILITY:  [facility]   PHYSICIAN:  Janine Limbo, M.D.DATE OF BIRTH:  Jun 30, 1941   DATE OF PROCEDURE:  01/12/2005  DATE OF DISCHARGE:                                 OPERATIVE REPORT   PREOPERATIVE DIAGNOSIS:  1.  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  2.  Inadequate colposcopy.  3.  Obesity (weight 285 pounds).  4.  Multiple medical problems.   POSTOPERATIVE DIAGNOSIS:  1.  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  2.  Inadequate colposcopy.  3.  Obesity (weight 285 pounds).  4.  Multiple medical problems.  5.  Endometrial polyps.   PROCEDURE:  1.  Hysteroscopy with resection of endometrial polyps.  2.  Dilatation and curettage.  3.  Conization of the cervix.   SURGEON:  Janine Limbo, M.D.   FIRST ASSISTANT:  None.   ANESTHETIC:  General.   DISPOSITION:  [name] is a 70 year old female, para 2-0-0-2, who presents  with the above-mentioned diagnoses. She understands the indications for her  surgical procedure and she accepts the risks of, but not limited to,  anesthetic complications, bleeding, infection, and possible damage to the  surrounding organs.   FINDINGS:  The uterus sounded to 7 cm. On hysteroscopy the patient was found  to have a 2 cm endometrial polyp from the fundus of the endometrial cavity.  She was found to have a 0.5 cm polyp on the right portion of the endometrial  cavity.   PROCEDURE:  The patient was taken to the operating room where she was given  a general anesthetic. The patient was placed in a lithotomy position. The  patient's lower abdomen, perineum, and vagina were prepped with multiple  layers of Betadine. The bladder was drained of urine. Examination under  anesthesia was then performed. The patient was sterilely draped. A speculum  was placed in the vagina. A  paracervical block was placed using 10 cc of  half percent Marcaine with epinephrine. An endocervical curettage was then  performed. The uterus was sounded to 7 cm. The cervix was gradually dilated.  The diagnostic hysteroscope was inserted into the endometrial cavity and  pictures were taken. The patient was found to have a large polyp. The  diagnostic hysteroscope was removed and the cervix was dilated even further.  The operative hysteroscope was then inserted and a single loop resection  apparatus was used to resect the large polyp. We then removed the smaller  polyp from the lateral portion of the endometrial cavity. At the end of our  procedure the cavity was felt to be clean. No other lesions were noted.  Pictures were taken of the patient's endometrial cavity. Hemostasis was  adequate. All instruments were removed. We then placed a figure-of-eight  suture at the 3 o'clock and 9 o'clock position on the cervix. The cervix was  then injected with 10 mL of 0.5% Marcaine with epinephrine. A  circumferential incision was made around the  endocervical os and the  incision was extended in a cone-like fashion toward the endocervix. The  specimen was removed and a stitch was placed at the 12 o'clock position.  Inverting sutures were placed on the cervix at the 12 o'clock, 3 o'clock, 6  o'clock, and 9 o'clock positions. Hemostasis was noted to be adequate. The  patient tolerated her procedure well. The procedure was complicated by the  fact that the patient is obese and by the fact that she has marked pelvic  relaxation which actually made it difficult to keep visualization in the  vagina. She did tolerate her procedure well, however. Repeat examination was  performed. The uterus was noted to be firm. The patient was awakened from  her anesthetic and she was taken to the recovery room in stable condition.  Sponge, needle, and instrument counts were correct. The estimated blood loss  for the  procedure was 10 cc. Sorbitol was used as a distension fluid and 410  mL were noted on the collection apparatus. However, surgical drapes were  noted to be saturated with sorbitol and sorbitol was also noted on the  operator's gown. My estimated sorbitol deficit was between 100 and 200 cc.  The patient did well with her procedure and her anesthetic and she was noted  to be stable in the recovery area.   FOLLOW-UP INSTRUCTIONS:  The the patient was offered observation overnight  but because she was doing quite well, the patient elected to go home. She  was given a prescription for Vicodin and she will take one or two tablets  every 4 hours as needed for pain. She will return to see Dr. Stefano Gaul in two  to three weeks for follow-up examination. She will call for any questions or  concerns. She was given a copy of the postoperative instruction sheet as  prepared by the [facility] for patients who have  undergone a dilatation and curettage.      AVS/MEDQ  D:  01/12/2005  T:  01/13/2005  Job:  [id number]   cc:   Holley Bouche, M.D.  [address]  [place], [place] [address]  Fax: [phone]   Clinton D. Maple Hudson, M.D.

## 2011-04-29 NOTE — Discharge Summary (Signed)
[place]  Patient:    [name], [name]                       MRN: [id number] Adm. Date:  [id number] Disc. Date: [id number] Attending:  Young, Copy CC:         Willis Modena. Dreiling, M.D.   Discharge Summary  ADMISSION DIAGNOSES: 1. Status asthmaticus. 2. Acute tracheobronchitis. 3. Vocal cord dysfunction with pseudo-wheeze, possible reflux. 4. Peripheral edema, rule out deep venous thrombosis. 5. Pulmonary sarcoidosis.  DISCHARGE DIAGNOSES:  1. Status asthmaticus.  2. Asthmatic bronchitis.  3. Pulmonary sarcoidosis.  4. Hiatal hernia, probable gastroesophageal reflux disease.  5. Vocal cord dysfunction.  6. Morbid obesity.  7. Obesity hypoventilation syndrome.  8. Obstructive sleep apnea.  9. Hyperglycemia on steroids. 10. Debilitation.  HISTORY OF PRESENT ILLNESS:  This is a 70 year old, nonsmoking, white woman followed by me for pulmonary sarcoidosis on prednisone, morbid obesity and chronic dyspnea.  Obstructive sleep apnea managed with CPAP.  She was admitted by Dr. Jayme Cloud with complaints of dyspnea, purulent sputum and streaky hemoptysis two days after having prednisone dose reduced in the office.  She was complaining of worse dyspnea and our concern on admission was possible pulmonary embolism.  Dyspnea did not improve on emergency room therapy with Solu-Medrol, nebulizer treatments and magnesium sulfate.  PHYSICAL EXAMINATION:  VITAL SIGNS:  Temperature 96.9, blood pressure 136/68, heart rate 88, respiratory rate 28, saturations 92% on 2 L prongs.  CHEST:  She was felt to have stridor due to vocal cord tightness/vocal cord dysfunction "pseudo-wheeze."  Chest, otherwise, was quiet on initial evaluation with normal heart sounds.  Chest x-ray showed interstitial changes and increased cardiac silhouette with hilar adenopathy.  She was noted to have pedal edema.  HOSPITAL COURSE:  Initial therapy was with 2 L nasal prong  oxygen, albuterol and Atrovent nebulizer treatments, Lovenox for DVT prophylaxis, Tequin for bronchitis and Solu-Medrol.  Blood sugars were monitored using sliding scale/CBG protocol and she was given diuretics and Protonix.  Blood sugars were controlled.  Initial streak hemoptysis attributed to hemorrhagic bronchitis that resolved.  Workup did not demonstrate pulmonary embolism.  She received diet counseling.  She complained of persistent cough which was minimally productive, but in context we suspected reflux and she had a modified barium swallow and speech therapy evaluation.  She also received weight loss diet counseling.  No active reflux was demonstrated, but the radiologic findings remained suspicious and we continued treatment addressing the possibility.  Wheeze was noted intermittently and she remained on bronchodilators with conversion to oral and metered therapies.  She found oxygen helpful, but did not qualify for home oxygen.  At the time of discharge, she had less cough and felt much better.  She received specific counseling regarding steroid therapy including its potential complications and side effects, effect on blood sugar, bones, weight and immune system in particular.  She was discharged home with condition improved.  After discussion with family and patient about the possibility of assisted-living, she did not accept that recommendation at this time with further family discussion to follow.  LABORATORY DATA AND X-RAY FINDINGS:  Pulmonary function testing showed obstructive lung disease with severe reduction in spirometry flows.  Measured lung volumes suggested mild to moderate restriction consistent with her obesity and sarcoidosis.  There was also a component of air trapping.  She did respond some to bronchodilator.  Diffusion capacity was normal for measured lung volume.  Venous Dopplers  showed no evidence of lower extremity DVT. There were bilateral Bakers cyst.   EKG showed sinus rhythm with low voltage QRS and no acute process.  Swallowing function barium swallow showed normal modified barium swallow.  There was no intrinsic or extrinsic mass.  There was slight mucosal fold thickening in the esophagus with slight irregularity in the mucosal relief films suggesting to the radiologist the possibility of mild esophagitis without ulcers or erosions seen.  There was a small sliding hiatal hernia, but no gastroesophageal reflux.  The radiologist indicated that he was suspicious that the patient may be refluxing because of the mucosal thickening.  CT scan of the chest with pulmonary embolism protocol showed no evidence of pulmonary emboli.  Persistent top normal sized lymph nodes in the pretracheal and right hilar region and in the subcarinal lymph nodes, nonspecific.  There were scattered nodular densities through both lung fields with pleural thickening and scarring in the lung bases.  Most of the nodules seen were similar to the prior exam of February 12, 2000, and December 07, 1999, except for one tiny nodule in the upper lung zone.  Old right-sided rib fracture was noted.  There was a 1.5 x 2 cm low density lesion in the dome of the liver not seen previously with some enhancement, possibly representing hemangioma with repeat CT scan of the abdomen necessary for any further delineation.  This was discussed with patient and family with the decision that we would not order repeat at this time.  Chest x-ray showed diffuse bronchiectatic and interstitial changes with questionable superior mediastinal adenopathy.  These changes were interpreted as consistent with her known diagnosis of sarcoid.  Arterial blood gas on room air on March 26, 2001, showed pH 7.4, pCO2 44, pO2 60, bicarb 27.  White blood count on admission showed 8300, hemoglobin 12.7, hematocrit 37.5, platelets 234,000, 2% eos. Admission chemistry was normal except for glucose of 175.  Repeated  on increased steroid dose with glucose 320, subsequently covered with sliding  scale insulin.  Angiotensin converting enzyme 43 (20-60).  TSH within normal limits at 0.665.  Sputum culture showed no growth with mixed incidental flora.  DIET:  1800 calorie ADA diet with sustained effort at weight loss.  DISCHARGE MEDICATIONS: 1. Nexium 40 mg q.d. for acid reflux. 2. Nebulizer with albuterol 2.5 mg. 3. Saline q.i.d. p.r.n. 4. Advair diskus 250/50 one inhalation b.i.d. 5. Phenergan with codeine. 6. Cough syrup 1 tsp q.6h. p.r.n. 7. Glucophage XL 500 mg q.a.m. 8. Prednisone 10 mg tablets to taper starting with 30 mg b.i.d. x 4 days,    20 mg b.i.d. x 4 days, then 30 mg q.d. until seen again in the office.  FOLLOWUP:  She was to see Dr. Maple Hudson for office follow in two to three weeks. She is to follow up with Dr. Lattie Corns for primary care and glucose management in two to three weeks. DD:  04/13/01 TD:  04/16/01 Job: [id number]

## 2011-04-29 NOTE — Discharge Summary (Signed)
[place]  Patient:    [name], [name]                       MRN: [id number] Adm. Date:  [id number] Disc. Date: 02/15/00 Attending:  Young, Copy CC:         Willis Modena. Dreiling, M.D.                           Discharge Summary  DISCHARGE DIAGNOSES: 1. Pulmonary sarcoidosis. 2. Lung nodules, probably sarcoid. 3. Morbid obesity with obstructive sleep apnea on home CPAP. 4. Asthma, improved. 5. 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 6. Anxiety.  BRIEF HISTORY:  A 70 year old, white female, nonsmoker with sarcoidosis diagnosed by mediastinoscopy in February, 2001.  Steroid therapy was begun in February and was tapered down to 20 mg daily.  She has a history of adult onset asthma and had been experiencing increasing wheezing over the last two weeks prior to admission.  She was hospitalized because of anxiety on the part of the patient and her family about her wheezing and general status with implication that something acute might have changed.  PAST MEDICAL HISTORY:  Other medical problems have included obesity, hypoventilation syndrome, morbid obesity, and obstructive sleep apnea on home CPAP, anxiety with depression, and history of hypertension.  ALLERGIES:  IBUPROFEN, said to cause shortness of breath without history of aspirin-induced asthma or of nasal polyps.  ADMISSION PHYSICAL EXAMINATION:  VITAL SIGNS:  Respirations 28 beats per minute.  Blood pressure 147/90. Weight 285.9 pounds.  Heart sounds and rhythm were normal.  P-2 was not increased.  LUNGS:  Coarsely congested with a nonproductive cough.  HOSPITAL COURSE:  She was begun on DVT prophylaxis with Lovenox by pharmacy protocol.  The Doppler studies of legs showed no evidence of thrombosis.  Care management reviewed her recent change in insurance coverage and determined that she would likely need assistance.  She has filed a Personal assistant. Blood  sugars were elevated on prednisone.  Diabetic education was done, and sliding scale coverage with Regular Human insulin was required.  She received nebulizer treatments with albuterol and Atrovent, began with Solu-Medrol and converted to prednisone, and received Rocephin for bronchitic component.  With these, her chest cleared completely.  She was discharged substantially improved, in that her chest congestion had resolved, and she had had an opportunity for further education and review of her physical complaints.  I had a frank conversation with family about the medical significance of her obesity and management of anxiety.  LABORATORY DATA:  Serum chemistries normal except for admission glucose at 297, BUN of 25, albumin 3.4, and AST was mildly elevated at 51 (0-37).  Other liver enzymes and bilirubin were normal.  Cardiac enzymes, including CK, CK-MB, and troponin I were normal.  TSH was normal at 1.67.  Urinalysis was negative with no glucose spilled.  White blood count on admission was 14,400. It rose to 17,100, attributed to steroids.  Hemoglobin was normal.  Platelet count was 204,000, 144,000.  Prothrombin time and PTT were normal.  EKG showed normal sinus rhythm.  Normal EKG.  Outpatient laboratory results were put on the chart from her mediastinoscopy on January 19, 2000 with lymph nodes showing ______ granulomas consistent with sarcoid.  No evidence of malignancy.  A CT scan in December had showed extensive mediastinal and hilar lymphadenopathy with interstitial lung disease, multiple bilateral, tiny pulmonary nodules, multiple  foci, and pleural thickening bilaterally, while most suggestive of sarcoid.  An echocardiogram on January 11, 2000 read by Dr. Peter M. [place] was technically limited by her obesity, but it showed an ejection fraction of 60-65%, normal left and right ventricles and atria with no pericardial effusion, and normal valve function.  A repeat CT scan on February 12, 2000 this admission, was slightly limited again by her body size, but without evidence of pulmonary embolus and main pulmonary arteries were adjacent segmental branches.  There was slight decrease in size of hilar adenopathy compared with the previous CT, and fluctuating appearance of pulmonary nodular densities; some smaller, and some apparently new, for which continued surveillance was recommended.  DISCHARGE PLANS:  She will keep office follow ups as scheduled with Dr. Maple Hudson and Dr. Lattie Corns.  Because of insurance change, she may end up having substantially all of her care by Dr. Lattie Corns.  DISCHARGE DIET:  Instructed 1800 calorie ADA.  DISCHARGE INSTRUCTIONS:  Diabetic education done with specific instruction in administration of insulin and use of a one-touch glucometer.  DISCHARGE MEDICATIONS: 1. Tranxene 3.75 mg p.o. q.h.s. 2. Sliding scale Regular Human insulin 150-199, four units; 200-299, eight    units; 300-399, 12 units; over 400, 14 units. 3. Home nebulizer, which she already has, with albuterol used 4 x daily if    needed. 4. Prednisone 20 mg p.o. q.a.m. 5. Home CPAP as before (? pressure). DD:  02/15/00 TD:  02/15/00 Job: [id number]

## 2011-05-02 ENCOUNTER — Encounter (INDEPENDENT_AMBULATORY_CARE_PROVIDER_SITE_OTHER): Payer: Self-pay | Admitting: General Surgery

## 2011-05-23 ENCOUNTER — Encounter (HOSPITAL_BASED_OUTPATIENT_CLINIC_OR_DEPARTMENT_OTHER): Payer: Medicare Other | Admitting: Oncology

## 2011-05-23 ENCOUNTER — Other Ambulatory Visit: Payer: Self-pay | Admitting: Oncology

## 2011-05-23 DIAGNOSIS — E119 Type 2 diabetes mellitus without complications: Secondary | ICD-10-CM

## 2011-05-23 DIAGNOSIS — C50419 Malignant neoplasm of upper-outer quadrant of unspecified female breast: Secondary | ICD-10-CM

## 2011-05-23 DIAGNOSIS — Z170 Estrogen receptor positive status [ER+]: Secondary | ICD-10-CM

## 2011-05-23 DIAGNOSIS — C50119 Malignant neoplasm of central portion of unspecified female breast: Secondary | ICD-10-CM

## 2011-05-23 DIAGNOSIS — C50919 Malignant neoplasm of unspecified site of unspecified female breast: Secondary | ICD-10-CM

## 2011-05-23 LAB — COMPREHENSIVE METABOLIC PANEL
ALT: 12 U/L (ref 0–35)
AST: 17 U/L (ref 0–37)
Albumin: 4.2 g/dL (ref 3.5–5.2)
Alkaline Phosphatase: 65 U/L (ref 39–117)
BUN: 18 mg/dL (ref 6–23)
Chloride: 97 mEq/L (ref 96–112)
Creatinine, Ser: 0.84 mg/dL (ref 0.50–1.10)
Potassium: 4.1 mEq/L (ref 3.5–5.3)

## 2011-05-23 LAB — CBC WITH DIFFERENTIAL/PLATELET
BASO%: 0.3 % (ref 0.0–2.0)
Basophils Absolute: 0 10*3/uL (ref 0.0–0.1)
EOS%: 2.9 % (ref 0.0–7.0)
HGB: 13.1 g/dL (ref 11.6–15.9)
MCH: 27.9 pg (ref 25.1–34.0)
MCHC: 33.5 g/dL (ref 31.5–36.0)
MCV: 83.2 fL (ref 79.5–101.0)
MONO%: 7.3 % (ref 0.0–14.0)
RDW: 15.2 % — ABNORMAL HIGH (ref 11.2–14.5)
lymph#: 1.1 10*3/uL (ref 0.9–3.3)

## 2011-05-27 ENCOUNTER — Ambulatory Visit
Admission: RE | Admit: 2011-05-27 | Discharge: 2011-05-27 | Disposition: A | Discharge status: Home / Self Care | Payer: Medicare Other | Source: Ambulatory Visit | Attending: Radiation Oncology | Admitting: Radiation Oncology

## 2011-06-01 DIAGNOSIS — D649 Anemia, unspecified: Secondary | ICD-10-CM | POA: Insufficient documentation

## 2011-06-13 ENCOUNTER — Ambulatory Visit (INDEPENDENT_AMBULATORY_CARE_PROVIDER_SITE_OTHER): Payer: Medicare Other | Admitting: Internal Medicine

## 2011-06-13 ENCOUNTER — Encounter: Payer: Self-pay | Admitting: Internal Medicine

## 2011-06-13 VITALS — BP 112/66 | HR 65 | Ht 59.0 in | Wt 319.0 lb

## 2011-06-13 DIAGNOSIS — D869 Sarcoidosis, unspecified: Secondary | ICD-10-CM

## 2011-06-13 DIAGNOSIS — G4730 Sleep apnea, unspecified: Secondary | ICD-10-CM

## 2011-06-13 MED ORDER — ALBUTEROL SULFATE HFA 108 (90 BASE) MCG/ACT IN AERS: 2.0000 | INHALATION_SPRAY | RESPIRATORY_TRACT | Status: DC | PRN

## 2011-06-13 MED ORDER — THEOPHYLLINE ER 200 MG PO CP24: 200.0000 mg | ORAL_CAPSULE | Freq: Two times a day (BID) | ORAL | Status: DC

## 2011-06-13 NOTE — Patient Instructions (Signed)
Order [facility]- replacement CPAP machine dx OSA,   9 cwp, heated humidifier and supplies     Send with copy of original diagnostic sleep study  You can call [facility] for refill on your nebulizer solution  Scripts for theophylline and for Proventil HFA rescue inhaler

## 2011-06-13 NOTE — Progress Notes (Signed)
  Subjective:    Patient ID: [name], female    DOB: November 02, 1941, 70 y.o.   MRN: [id number]  HPI 06/13/11- 70 yoF never smoker Last here December 21, 2010 - Note reviewed Blames heat for lack energy. She also had right breast lumpectomy and XRT in February. Now pending right knee replacement next week.  Got good report at cardiology last week, following for her AFIb. She had stopped ritalin as ineffective for complaints of tiredness before.  Continues CPAPat 9 cwp.  Review of Systems Constitutional:   No- weight loss, night sweats,  Fevers, chills, . HEENT:   No headaches,  Difficulty swallowing,  Tooth/dental problems,  Sore throat,                No sneezing, itching, ear ache, nasal congestion, post nasal drip,   CV:  No chest pain, orthopnea, PND, swelling in lower extremities, anasarca, dizziness, palpitations  GI  No heartburn, indigestion, abdominal pain, nausea, vomiting, diarrhea, change in bowel habits, loss of appetite  Resp: No acute shortness of breath with exertion or at rest.  No excess mucus, no productive cough,  No non-productive cough,  No coughing up of blood.  No change in color of mucus.  No wheezing.    Skin: no rash or lesions.  GU: no dysuria, change in color of urine, no urgency or frequency.  No flank pain.  MS:  No joint pain or swelling.  No decreased range of motion.  No back pain.  Psych:  No change in mood or affect. No depression or anxiety.  No memory loss.      Objective:   Physical Exam  General- Alert, Oriented, Affect-appropriate, Distress- none acute   Very obese  Skin- rash-none, lesions- none, excoriation- none  Lymphadenopathy- none  Head- atraumatic  Eyes- Gross vision intact, PERRLA, conjunctivae clear secretions  Ears- Hearing, canals, Tm- normal  Nose- Clear, No- Septal dev, mucus, polyps, erosion, perforation   Throat- Mallampati II , mucosa clear , drainage- none, tonsils- atrophic  Neck- flexible , trachea midline,  no stridor , thyroid nl, carotid no bruit  Chest - symmetrical excursion , unlabored     Heart/CV- RRR , no murmur , no gallop  , no rub, nl s1 s2      Pulse feels regular today                     - JVD- none , edema- none, stasis changes- none, varices- none     Lung- clear to P&A, wheeze- none, cough- none , dullness-none, rub- none     Chest wall-   Abd- tender-no, distended-no, bowel sounds-present, HSM- no  Br/ Gen/ Rectal- Not done, not indicated  Extrem- cyanosis- none, clubbing, none, atrophy- none, strength- nl  Neuro- grossly intact to observation         Assessment & Plan:

## 2011-06-13 NOTE — Assessment & Plan Note (Signed)
Good compliance and control on 9 cwp/ [facility]. We are sending copy of her sleep study and script for replacement mask.

## 2011-06-13 NOTE — Assessment & Plan Note (Signed)
She has had enough CXR's, so we will wait and watch for changes .

## 2011-06-16 ENCOUNTER — Other Ambulatory Visit (HOSPITAL_COMMUNITY): Payer: Self-pay | Admitting: Orthopedic Surgery

## 2011-06-16 ENCOUNTER — Other Ambulatory Visit: Payer: Self-pay | Admitting: Orthopedic Surgery

## 2011-06-16 ENCOUNTER — Encounter: Payer: Self-pay | Admitting: Internal Medicine

## 2011-06-16 ENCOUNTER — Ambulatory Visit (HOSPITAL_COMMUNITY)
Admission: RE | Admit: 2011-06-16 | Discharge: 2011-06-16 | Disposition: A | Discharge status: Home / Self Care | Payer: Medicare Other | Source: Ambulatory Visit | Attending: Orthopedic Surgery | Admitting: Orthopedic Surgery

## 2011-06-16 ENCOUNTER — Encounter (HOSPITAL_COMMUNITY): Payer: Medicare Other

## 2011-06-16 DIAGNOSIS — Z853 Personal history of malignant neoplasm of breast: Secondary | ICD-10-CM | POA: Insufficient documentation

## 2011-06-16 DIAGNOSIS — J45909 Unspecified asthma, uncomplicated: Secondary | ICD-10-CM | POA: Insufficient documentation

## 2011-06-16 DIAGNOSIS — M1710 Unilateral primary osteoarthritis, unspecified knee: Secondary | ICD-10-CM | POA: Insufficient documentation

## 2011-06-16 DIAGNOSIS — R0602 Shortness of breath: Secondary | ICD-10-CM | POA: Insufficient documentation

## 2011-06-16 DIAGNOSIS — I4891 Unspecified atrial fibrillation: Secondary | ICD-10-CM | POA: Insufficient documentation

## 2011-06-16 DIAGNOSIS — Z01818 Encounter for other preprocedural examination: Secondary | ICD-10-CM

## 2011-06-16 LAB — PROTIME-INR: Prothrombin Time: 24.2 seconds — ABNORMAL HIGH (ref 11.6–15.2)

## 2011-06-16 LAB — COMPREHENSIVE METABOLIC PANEL
ALT: 13 U/L (ref 0–35)
CO2: 27 mEq/L (ref 19–32)
Calcium: 9.5 mg/dL (ref 8.4–10.5)
Chloride: 102 mEq/L (ref 96–112)
GFR calc Af Amer: >60 mL/min (ref >60)
GFR calc non Af Amer: >60 mL/min (ref >60)
Glucose, Bld: 225 mg/dL — ABNORMAL HIGH (ref 70–99)
Sodium: 139 mEq/L (ref 135–145)
Total Bilirubin: 0.4 mg/dL (ref 0.3–1.2)

## 2011-06-16 LAB — URINALYSIS, ROUTINE W REFLEX MICROSCOPIC
Hgb urine dipstick: NEGATIVE
Leukocytes, UA: NEGATIVE
Protein, ur: NEGATIVE mg/dL
Urobilinogen, UA: 0.2 mg/dL (ref 0.0–1.0)

## 2011-06-16 LAB — CBC
MCHC: 32.2 g/dL (ref 30.0–36.0)
RDW: 14.8 % (ref 11.5–15.5)

## 2011-06-17 ENCOUNTER — Telehealth: Payer: Self-pay | Admitting: Internal Medicine

## 2011-06-17 MED ORDER — ALBUTEROL SULFATE (2.5 MG/3ML) 0.083% IN NEBU: 2.5000 mg | INHALATION_SOLUTION | Freq: Four times a day (QID) | RESPIRATORY_TRACT | Status: DC | PRN

## 2011-06-17 NOTE — Telephone Encounter (Signed)
[id number]

## 2011-06-17 NOTE — Telephone Encounter (Signed)
Spoke with the patient and she states that Dr. Maple Hudson told her he sent an rx to [facility] at last OV, but they are stating they did not receive anything. I see in last OV note that proair rx was sent to pharmacy and per instructions the pt was to call [facility] for refill on albuterol neb med. Please advise if it is ok to send in rx for albuterol to [facility]. Thanks. Carron Curie, CMA Allergies  Allergen Reactions  . Pravachol   . Pravastatin Sodium

## 2011-06-17 NOTE — Telephone Encounter (Signed)
Per Florentina Addison, this should be for albuterol neb qid prn.  Pt aware rx will be sent to [facility].  Rx printed and placed on CDY's cart for signature.  Once signed will give to [facility].

## 2011-06-17 NOTE — Telephone Encounter (Signed)
Yes this is okay to send.

## 2011-06-20 ENCOUNTER — Inpatient Hospital Stay (HOSPITAL_COMMUNITY)
Admission: RE | Admit: 2011-06-20 | Discharge: 2011-06-23 | DRG: 470 | Disposition: A | Discharge status: Skilled Nursing Facility | Payer: Medicare Other | Source: Ambulatory Visit | Attending: Orthopedic Surgery | Admitting: Orthopedic Surgery

## 2011-06-20 ENCOUNTER — Ambulatory Visit: Payer: Self-pay | Admitting: Internal Medicine

## 2011-06-20 DIAGNOSIS — Z8542 Personal history of malignant neoplasm of other parts of uterus: Secondary | ICD-10-CM

## 2011-06-20 DIAGNOSIS — E876 Hypokalemia: Secondary | ICD-10-CM | POA: Diagnosis not present

## 2011-06-20 DIAGNOSIS — E871 Hypo-osmolality and hyponatremia: Secondary | ICD-10-CM | POA: Diagnosis not present

## 2011-06-20 DIAGNOSIS — D869 Sarcoidosis, unspecified: Secondary | ICD-10-CM | POA: Diagnosis present

## 2011-06-20 DIAGNOSIS — E119 Type 2 diabetes mellitus without complications: Secondary | ICD-10-CM | POA: Diagnosis present

## 2011-06-20 DIAGNOSIS — I4891 Unspecified atrial fibrillation: Secondary | ICD-10-CM | POA: Diagnosis present

## 2011-06-20 DIAGNOSIS — G4733 Obstructive sleep apnea (adult) (pediatric): Secondary | ICD-10-CM | POA: Diagnosis present

## 2011-06-20 DIAGNOSIS — G9332 Myalgic encephalomyelitis/chronic fatigue syndrome: Secondary | ICD-10-CM | POA: Diagnosis present

## 2011-06-20 DIAGNOSIS — Z853 Personal history of malignant neoplasm of breast: Secondary | ICD-10-CM

## 2011-06-20 DIAGNOSIS — R5382 Chronic fatigue, unspecified: Secondary | ICD-10-CM | POA: Diagnosis present

## 2011-06-20 DIAGNOSIS — Z01812 Encounter for preprocedural laboratory examination: Secondary | ICD-10-CM

## 2011-06-20 DIAGNOSIS — M1710 Unilateral primary osteoarthritis, unspecified knee: Principal | ICD-10-CM | POA: Diagnosis present

## 2011-06-20 LAB — GLUCOSE, CAPILLARY
Glucose-Capillary: 173 mg/dL — ABNORMAL HIGH (ref 70–99)
Glucose-Capillary: 246 mg/dL — ABNORMAL HIGH (ref 70–99)

## 2011-06-20 LAB — TYPE AND SCREEN: ABO/RH(D): O NEG

## 2011-06-20 LAB — PROTIME-INR
INR: 1.16 (ref 0.00–1.49)
Prothrombin Time: 15 seconds (ref 11.6–15.2)

## 2011-06-20 LAB — APTT: aPTT: 32 seconds (ref 24–37)

## 2011-06-21 LAB — BASIC METABOLIC PANEL
BUN: 9 mg/dL (ref 6–23)
Creatinine, Ser: 0.58 mg/dL (ref 0.50–1.10)
GFR calc Af Amer: >60 mL/min (ref >60)
GFR calc non Af Amer: >60 mL/min (ref >60)

## 2011-06-21 LAB — CBC
HCT: 34 % — ABNORMAL LOW (ref 36.0–46.0)
MCHC: 31.8 g/dL (ref 30.0–36.0)
MCV: 83.5 fL (ref 78.0–100.0)
RDW: 15.4 % (ref 11.5–15.5)

## 2011-06-21 LAB — GLUCOSE, CAPILLARY
Glucose-Capillary: 195 mg/dL — ABNORMAL HIGH (ref 70–99)
Glucose-Capillary: 209 mg/dL — ABNORMAL HIGH (ref 70–99)
Glucose-Capillary: 234 mg/dL — ABNORMAL HIGH (ref 70–99)

## 2011-06-22 LAB — CBC
Hemoglobin: 10.8 g/dL — ABNORMAL LOW (ref 12.0–15.0)
MCHC: 32 g/dL (ref 30.0–36.0)
Platelets: 199 10*3/uL (ref 150–400)
RBC: 4.07 MIL/uL (ref 3.87–5.11)

## 2011-06-22 LAB — GLUCOSE, CAPILLARY
Glucose-Capillary: 237 mg/dL — ABNORMAL HIGH (ref 70–99)
Glucose-Capillary: 222 mg/dL — ABNORMAL HIGH (ref 70–99)

## 2011-06-22 LAB — PROTIME-INR
INR: 1.49 (ref 0.00–1.49)
Prothrombin Time: 18.3 seconds — ABNORMAL HIGH (ref 11.6–15.2)

## 2011-06-22 LAB — BASIC METABOLIC PANEL
Chloride: 95 mEq/L — ABNORMAL LOW (ref 96–112)
Creatinine, Ser: 0.64 mg/dL (ref 0.50–1.10)
GFR calc Af Amer: >60 mL/min (ref >60)
GFR calc non Af Amer: >60 mL/min (ref >60)

## 2011-06-22 NOTE — Op Note (Signed)
[name], [name] NO.:  [id number]  MEDICAL RECORD NO.:  [id number]  LOCATION:  0012                         FACILITY:  [facility]  PHYSICIAN:  Ollen Gross, M.D.    DATE OF BIRTH:  01-29-1941  DATE OF PROCEDURE:  06/20/2011 DATE OF DISCHARGE:                              OPERATIVE REPORT   PREOPERATIVE DIAGNOSIS:  Osteoarthritis, right knee.  POSTOPERATIVE DIAGNOSIS:  Osteoarthritis, right knee.  PROCEDURE:  Right total knee arthroplasty.  SURGEON:  Ollen Gross, M.D.  ASSISTANT:  Alexzandrew L. Perkins, P.A.C.  ANESTHESIA:  General.  ESTIMATED BLOOD LOSS:  300.  DRAIN:  Hemovac x1.  TOURNIQUET TIME:  5 minutes at 300 mmHg.  COMPLICATIONS:  None.  CONDITION.:  Stable to recovery.  BRIEF CLINICAL NOTE:  [name] is a 70 year old female with advanced end-stage arthritis of the right knee with progressively worsening pain and dysfunction.  She had a previous successful left total knee arthroplasty and presents now for right total knee arthroplasty.  PROCEDURE IN DETAIL:  After successful administration of general anesthetic, a tourniquet was placed high on her right thigh and right lower extremity prepped and draped in usual sterile fashion.  Extremity was wrapped in Esmarch, knee flexed, and tourniquet inflated to 300 mmHg.  A midline incision made with a 10 blade through a very thick layer of subcutaneous tissue to the level of the extensor mechanism.  A fresh blade was used to make a medial parapatellar arthrotomy.  At this point, it was obvious that the tourniquet was not functioning normally. It was acting as a venous tourniquet.  I released the tourniquet for total time of 5 minutes.  This decreased the bleeding significantly. Any bleeding identified was then stopped with electrocautery.  Once the arthrotomy was completed, then the soft tissue on the proximal medial tibia subperiosteally elevated to the joint line with the knife and  into the semimembranosus bursa with a Cobb elevator.  Soft tissue laterally was elevated with attention being paid to avoiding the patellar tendon on tibial tubercle.  Patella was everted, knee flexed 90 degrees, and ACL and PCL removed.  Drill was used create a starting hole in the distal femur and the canal was thoroughly irrigated.  The 5-degree right valgus alignment guide was placed and distal femoral cutting block was pinned to remove 10 mm off the distal femur.  Resection was made with an oscillating saw.  The tibia subluxed forward and the extramedullary tibial alignment guide was placed.  The guide was placed referencing proximally at the medial aspect of the tibial tubercle and distally along the second metatarsal axis and tibial crest.  It was pinned to remove 2 mm off the more deficient medial side.  Tibial resection was made with an oscillating saw.  Size 3 was the most appropriate tibial component.  I prepared for an MBT revision 3 due to the size of her leg.  I felt that this would provide for a more long-lasting construct.  The femur was then incised and 4 narrow was most appropriate.  Rotation was marked at the epicondylar axis and confirmed by creating rectangular flexion gap at 90 degrees.  The size 4 cutting  block was placed in this rotation and then the anterior-posterior and chamfer cuts were made. Intercondylar block was placed and that cut was made.  Trial size 4 narrow posterior stabilized femur was placed and size 3 tibial trial.  A 10-mm posterior stabilized rotating platform insert trial was placed. With 10, full extension was achieved with excellent varus-valgus and anterior-posterior balance throughout full range of motion.  Patella was everted and thickness measured to be 24 mm.  Freehand resection was taken to 14 mm, 38 template was placed, lug holes were drilled, trial patella was placed and it tracks normally.  Osteophytes were removed off the  posterior femur with the trial in place.  All trials were removed and the cut bone surfaces prepared with pulsatile lavage.  Cement was mixed and once ready for implantation, size 3 MBT revision tray, size 4 narrow posterior stabilized femur and 38 patella were cemented into place.  Patella was held with a clamp.  Trial 10-mm insert was placed, knee held in full extension and all extruded cement removed.  When the cement had fully hardened, then the permanent 10-mm posterior stabilized rotating platform insert was placed in the tibial tray.  The wound was copiously irrigated with saline solution and the arthrotomy closed over Hemovac drain with interrupted #1 PDS.  Flexion against gravity was about 120 degrees at which point the calf and posterior thigh were touching.  The subcu was then closed with interrupted 2-0 Vicryl and subcuticular with running 4-0 Monocryl.  Catheter for Marcaine pain pump was placed and pump initiated.  Incisions cleaned and dried and Steri- Strips and a bulky sterile dressing were applied.  She was then placed into a knee immobilizer, awakened and transported to recovery in stable condition.     Ollen Gross, M.D.     FA/MEDQ  D:  06/20/2011  T:  06/20/2011  Job:  [id number]  Electronically Signed by Ollen Gross M.D. on 06/22/2011 12:34:14 PM

## 2011-06-23 LAB — CBC
HCT: 32.4 % — ABNORMAL LOW (ref 36.0–46.0)
Hemoglobin: 10.4 g/dL — ABNORMAL LOW (ref 12.0–15.0)
MCV: 82.4 fL (ref 78.0–100.0)
RBC: 3.93 MIL/uL (ref 3.87–5.11)
RDW: 15.4 % (ref 11.5–15.5)
WBC: 13.5 10*3/uL — ABNORMAL HIGH (ref 4.0–10.5)

## 2011-06-23 LAB — BASIC METABOLIC PANEL
CO2: 32 mEq/L (ref 19–32)
Chloride: 93 mEq/L — ABNORMAL LOW (ref 96–112)
Glucose, Bld: 228 mg/dL — ABNORMAL HIGH (ref 70–99)
Potassium: 3.4 mEq/L — ABNORMAL LOW (ref 3.5–5.1)
Sodium: 134 mEq/L — ABNORMAL LOW (ref 135–145)

## 2011-06-23 LAB — GLUCOSE, CAPILLARY: Glucose-Capillary: 248 mg/dL — ABNORMAL HIGH (ref 70–99)

## 2011-06-23 LAB — PROTIME-INR: INR: 1.58 — ABNORMAL HIGH (ref 0.00–1.49)

## 2011-06-27 NOTE — Discharge Summary (Signed)
[name], [name] NO.:  [id number]  MEDICAL RECORD NO.:  [id number]  LOCATION:  1610                         FACILITY:  [facility]  PHYSICIAN:  Ollen Gross, M.D.    DATE OF BIRTH:  [birth year]  DATE OF ADMISSION:  06/20/2011 DATE OF DISCHARGE:  06/23/2011                        DISCHARGE SUMMARY - REFERRING   ADMITTING DIAGNOSES: 1. Osteoarthritis, right knee. 2. History of asthma. 3. History of bronchitis. 4. Obstructive sleep apnea. 5. Obesity. 6. Sarcoidosis. 7. Hypertension. 8. Hypercholesterolemia. 9. History of atrial fibrillation. 10.Hemorrhoids. 11.History of diverticulosis. 12.Past history of urinary tract infection. 13.Non-insulin-dependent diabetes mellitus. 14.Past history of endometrial cancer, post hysterectomy. 15.History of breast cancer 16.Chronic fatigue syndrome. 17.History of urinary incontinence. 18.Childhood illnesses, measles, mumps, rubella.  DISCHARGE DIAGNOSES:1. Osteoarthritis, right knee, status post right total knee     replacement arthroplasty. 2. Mild postop hyponatremia. 3. Mild postop hypokalemia. 4. History of asthma. 5. History of bronchitis. 6. Obstructive sleep apnea. 7. Obesity. 8. Sarcoidosis. 9. Hypertension. 10.Hypercholesterolemia. 11.History of atrial fibrillation. 12.Hemorrhoids. 13.History of diverticulosis. 14.Past history of urinary tract infection. 15.Non-insulin-dependent diabetes mellitus. 16.Past history of endometrial cancer, post hysterectomy. 17.History of breast cancer 18.Chronic fatigue syndrome. 19.History of urinary incontinence. 20.Childhood illnesses measles, mumps, rubella.  PROCEDURE:  June 20, 2011, right total knee.  SURGEON:  Ollen Gross, M.D.  ASSISTANT:  Alexzandrew L. Perkins, P.A.C.  ANESTHESIA:  General.  TOURNIQUET TIME:  Only 5 minutes, let tourniquet down since it was not functioning properly and was acting as a venous tourniquet.  CONSULTS:  None.  BRIEF  HISTORY:  The patient is a 70 year old female with advanced arthritis of the right knee with progressive worsening pain and dysfunction.  She has had a successful left total knee, now presents for right total knee.  LABORATORY DATA:  CBC on admission hemoglobin 13.0, hematocrit 40.4, white cell count 7.3, platelets 198.  PT/INR, preop labs showed 24.2 with an INR 2.13 with PTT of 48.  Rechecked on the date of surgery, INR was normal at 1.16.  Chem panel preop, elevated glucose of 225. Remaining Chem panel within normal limits.  Preop UA showed positive urine glucose, otherwise, negative.  Blood group type O negative.  Nasal swabs were negative.  Staph aureus negative for MRSA.  Serial CBCs were followed.  Hemoglobin dropped down to 10.8, last noted H and H 10.4 and 32.4.  Serial BMETs were followed for 3 days.  Sodium dropped from 139 to 132, back up to 134.  Potassium dropped from 3.8 to 3.4, given potassium supplements prior to discharge.  Remaining electrolytes remained within normal limits.  Glucose was noted started at 225, was noted at 228, unchanged.  EKG dated June 02, 2011, normal sinus rhythm.  HOSPITAL COURSE:  The patient admitted to [facility], taken to OR, underwent above-stated procedure without complication.  The patient tolerated the procedure well, later transferred to the recovery room and orthopedic floor, started on a p.o. and IV push analgesics pain control. She had difficult night with pain, doing a little bit better on the morning of day #1, encouraged p.o. meds.  She was started back on her home medications.  She was started back on her  CPAP.  We held her metformin initially after surgery to ensure that her renal function was good and she had good p.o. diet.  She had excellent urinary output, started getting up out of bed, requiring moderate assist.  She did not want to look into skilled facility, so we got social worker involved. She was requiring  total assist on day #1 and felt that she would require skilled facility by day #2.  She was still in pain but doing a little bit better.  Hemoglobin was stable.  Dressing changed, incision looked good.  No signs of infection.  No bleeding.  Sodium was down to 134.  It was noted at 132 on postop day #1 and got up a little bit to 134 which stabilized.  She continued to work with therapy each day and still requiring total assist with transfers and mobility.  She was seen on the morning of day #3 and was it felt that a bed should be available.  She was still hurting but tolerating her pills.  She was seen in rounds by Dr. Lequita Halt.  As long as a bed was available, she would be transferred over at that time.  Her sodium was stable.  Potassium was down little bit, so we gave her some oral potassium supplements on day #3 and then have them recheck the BMET at the skilled facility.  DISCHARGE/PLAN: 1. The patient will be transferred to skilled facility on June 23, 2011. 2. Discharge diagnoses, please see above. 3. Discharge meds.  Current medications at time of transfer include: 1. Lovenox 30 mg every 12 hours subcu injection.  Please note to     continue the Lovenox until her INR is 2.0 or greater. 2. Coumadin protocol.  Normally Coumadin protocol for 3 weeks but this     patient is on chronic Coumadin, wean to a therapeutic level for 3     weeks and then she will be back on her home dosage after 3 weeks. 3. Colace 100 mg p.o. b.i.d. 4. Lopressor 25 mg p.o. b.i.d. 5. Bumex 1 mg p.o. b.i.d. 6. Theophylline 200 mg p.o. b.i.d. 7. Potassium 20 mEq p.o. b.i.d. 8. Advair 250/50 inhaler 1 puff b.i.d. 9. Simvastatin 40 mg at bedtime. 10.Diltiazem CD 360 mg p.o. daily. 11.Robaxin 500 mg p.o. q.6-8 h p.r.n. spasm. 12.Restoril 15 to 30 mg p.o. q.h.s. p.r.n. sleep. 13.Tylenol 325 one or two every 4 to 6 hours as needed for mild pain,     temperature or headache. 14.OxyIR 5 to 10 to 15 mg p.o.  every 4 hours as needed for moderate     pain.  DIET:  Heart-healthy diabetic diet.  ACTIVITY:  She is weightbearing as tolerated.  Total knee protocol.  PT and OT for gait training, ambulation, ADLs.  Please note the patient may start showering, however, do not submerge incision underwater.  Daily dressing change to the right knee incision.  DISPOSITION:  Waiting on final bed offers.  CONDITION ON DISCHARGE:  Improving.  Further labs, please recheck a BMET on Friday, July 13 to ensure that her potassium has improved, also was noted to have low sodium postop which was stable at time of transfer.     Alexzandrew L. Julien Girt, P.A.C.   ______________________________ Ollen Gross, M.D.    ALP/MEDQ  D:  06/23/2011  T:  06/23/2011  Job:  [id number]  cc:   Ritta Slot, MD Fax: [phone]  Holley Bouche, M.D. Fax: [phone]  Clinton D. Maple Hudson, MD,  FCCP, FACP [facility]-Pulmonary [facility] [address] [place] [address]  Electronically Signed by Patrica Duel P.A.C. on 06/23/2011 10:43:33 AM Electronically Signed by Ollen Gross M.D. on 06/27/2011 06:51:35 AM

## 2011-06-27 NOTE — H&P (Signed)
[name], [name] NO.:  [id number]  MEDICAL RECORD NO.:  [id number]  LOCATION:                                 FACILITY:  PHYSICIAN:  Ollen Gross, M.D.    DATE OF BIRTH:  17-Apr-1941  DATE OF ADMISSION:  06/20/2011 DATE OF DISCHARGE:                             HISTORY & PHYSICAL   CHIEF COMPLAINT:  Right knee pain.  HISTORY OF PRESENT ILLNESS:  The patient is 70 year old female who was seen by Dr. Lequita Halt for ongoing right knee pain.  She has had a left total knee back in April 2011 and the left knee seems to be doing very. The right knee, although she has advanced arthritis has progressively gotten worse.  She is ready to have the other side done.  She has been seen preoperative by Dr. Lynnea Ferrier, recommend holding her Coumadin 5 to 7 days prior to surgery and restarting it as soon as possible.  She will go back on her Coumadin and will have a Lovenox bridge postoperatively. Dr. Lynnea Ferrier felt she is at low cardiovascular risk.  ALLERGIES:  PRAVACHOL.  CURRENT MEDICATIONS:  Potassium, theophylline, Advair, Proventil, simvastatin, diltiazem, metoprolol, metformin, vitamin C, bumetanide Coumadin, fexofenadine, stool softeners, calcium plus D, fish oil, multivitamin.  PAST MEDICAL HISTORY: 1. History of asthma. 2. History bronchitis. 3. Obstructive sleep apnea. 4. Obesity. 5. Sarcoidosis. 6. Hypertension. 7. Hypercholesterolemia. 8. Past history of atrial fibrillation. 9. Hemorrhoids. 10.History of diverticulosis. 11.Past history of urinary tract infections. 12.Non-insulin-dependent diabetes mellitus. 13.History of endometrial cancer post hysterectomy. 14.History of breast cancer. 15.Chronic fatigue syndrome. 16.History of urinary incontinence. 17.Childhood illnesses of measles, mumps, rubella.  PAST SURGICAL HISTORY:  Hysterectomy, breast surgery, and a left knee replacement.  FAMILY HISTORY:  Father with colon cancer.  Mother with  diabetes. Sister with cancer.  Another sister with diabetes, brother with diabetes, and a second brother with cancer.  SOCIAL HISTORY:  Divorced, nonsmoker.  No alcohol.  Lives alone.  She does want to look into a rehab facility postoperatively.  REVIEW OF SYSTEMS:  GENERAL:  No fevers, occasional chills, no night sweats.  NEURO:  Occasional headaches.  She has some hearing loss, insomnia.  RESPIRATORY:  She does have little bit shortness of breath on exertion but no shortness of breath at rest.  Occasional wheezing.  She does have history of asthma and bronchitis.  CARDIOVASCULAR:  No chest pain, no orthopnea.  GI:  Occasional constipation and some occasional difficulty swallowing.  No nausea, vomiting, diarrhea.  GU:  Little bit of nocturia, urinary retention in the past.  MUSCULOSKELETAL:  Knee pain.  PHYSICAL EXAMINATION:  VITAL SIGNS:  Pulse 84, respirations 12, blood pressure 132/60. GENERAL:  A 70 year old white female, well nourished, well developed, overweight, obese, no acute distress.  She is alert, oriented, cooperative, and pleasant. HEENT:  Normocephalic, atraumatic.  Pupils round and reactive.  EOMs intact. NECK:  Supple. CHEST:  Clear. HEART:  Regular rate and rhythm without murmur.  S1 and S2 noted. ABDOMEN:  Soft, protuberant.  Bowel sounds present. RECTAL:  Not done, not pertinent to present illness. BREASTS:  Not done, not pertinent to present illness. GENITALIA:  Not done, not  pertinent to present illness. EXTREMITIES:  Right knee range of motion 10 to 115, varus deformity.  No instability.  No effusion.  Marked crepitus.  IMPRESSION:  Osteoarthritis, right knee.  PLAN:  The patient will be admitted to [facility], undergo right total knee replacement arthroplasty.  Surgery will be performed by Dr. Ollen Gross.     Alexzandrew L. Julien Girt, P.A.C.   ______________________________ Ollen Gross, M.D.    ALP/MEDQ  D:  06/19/2011  T:   06/19/2011  Job:  [id number]  cc:   Ritta Slot, MD Fax: [phone]  Holley Bouche, M.D. Fax: [phone]  Clinton D. Maple Hudson, MD, FCCP, FACP [facility]-Pulmonary Dept [address] [place] [address]  Electronically Signed by Patrica Duel P.A.C. on 06/23/2011 10:43:24 AM Electronically Signed by Ollen Gross M.D. on 06/27/2011 06:51:33 AM

## 2011-08-01 ENCOUNTER — Encounter (INDEPENDENT_AMBULATORY_CARE_PROVIDER_SITE_OTHER): Payer: Self-pay | Admitting: General Surgery

## 2011-08-01 ENCOUNTER — Ambulatory Visit (INDEPENDENT_AMBULATORY_CARE_PROVIDER_SITE_OTHER): Payer: Medicare Other | Admitting: General Surgery

## 2011-08-01 ENCOUNTER — Telehealth: Payer: Self-pay | Admitting: Internal Medicine

## 2011-08-01 VITALS — BP 122/68 | HR 64 | Temp 96.8°F | Ht 59.0 in | Wt 301.0 lb

## 2011-08-01 DIAGNOSIS — Z853 Personal history of malignant neoplasm of breast: Secondary | ICD-10-CM

## 2011-08-01 NOTE — Telephone Encounter (Signed)
If no openings for CY earlier then okay to see TP; otherwise can we answer O2 problem over the phone?

## 2011-08-01 NOTE — Patient Instructions (Signed)
Your right breast exam today is normal. There are no abnormalities or signs of any cancer. I will see you back in February of 2013. Be sure to get your bilateral mammograms in January of 2013.

## 2011-08-01 NOTE — Telephone Encounter (Signed)
I do not think it can be answered over the phone- they are concerned about how long she will need o2, why she needs it and also want to eval her increased SOB. Needs the appt with TP. LMTCB

## 2011-08-01 NOTE — Telephone Encounter (Signed)
Spoke with pt's son Ethelene Browns. He states that pt needs appt asap to discuss o2. He states that she is currently at [facility] and is being d/c'ed from there tomorrow. She had a TKR and has been placed on o2 24/7 since then. He states that she also seems to be having some increased SOB. Would like appt asap. No openings this wk. Please advise, thanks!

## 2011-08-01 NOTE — Progress Notes (Signed)
Chief Complaint  Patient presents with  . Other    post op - breast recheck    HPI [name] is a 70 y.o. female.    This patient underwent right partial mastectomy, reexcision medial margin and SLN biopsy on February 08, 2011. Her right breast cancer is stage TI B., N0, estrogen receptor 100%, progesterone receptor 100%, Ki-67 8%, HER-2 negative.  She has done well since her right partial mastectomy. She has no complaints about her breast.  She underwent a right total knee replacement on June 19, 2000 Dr. Despina Hick. She is feeling better already. She is living at [facility] planning to go home soon.  She is followed up Drue Second, Dr. Dorothy Puffer, and her primary care physician Dr. Holley Bouche. HPI  Past Medical History  Diagnosis Date  . Diabetes mellitus   . Asthma   . Bronchitis   . Hernia   . Anemia   . Cancer   . Arthritis   . Hernia     Past Surgical History  Procedure Date  . Total knee arthroplasty 2011  . Abdominal hysterectomy 2006    Family History  Problem Relation Age of Onset  . Diabetes Mother   . Cancer Father   . Diabetes Brother   . Cancer Sister   . Diabetes Sister     Social History History  Substance Use Topics  . Smoking status: Never Smoker   . Smokeless tobacco: Not on file  . Alcohol Use: No    Allergies  Allergen Reactions  . Pravachol   . Pravastatin Sodium     Current Outpatient Prescriptions  Medication Sig Dispense Refill  . albuterol (PROVENTIL HFA) 108 (90 BASE) MCG/ACT inhaler Inhale 2 puffs into the lungs every 4 (four) hours as needed for wheezing or shortness of breath.  1 Inhaler  prn  . Albuterol (PROVENTIL IN) Inhale into the lungs.        Marland Kitchen albuterol (PROVENTIL) (2.5 MG/3ML) 0.083% nebulizer solution Take 3 mLs (2.5 mg total) by nebulization 4 (four) times daily as needed.  360 mL  3  . Ascorbic Acid (VITAMIN C PO) Take by mouth.        . bumetanide (BUMEX) 1 MG tablet Take 1 mg by mouth daily.        .  Calcium Carbonate-Vitamin D (CALCIUM 600 + D PO) Take by mouth.        . ciprofloxacin (CIPRO) 250 MG tablet Take 1 tablet by mouth Twice daily.      Marland Kitchen diltiazem (TIAZAC) 360 MG 24 hr capsule Take 1 tablet by mouth Daily.      . fexofenadine (ALLEGRA) 180 MG tablet Take 180 mg by mouth daily.        . Fluticasone-Salmeterol (ADVAIR DISKUS) 250-50 MCG/DOSE AEPB Inhale 1 puff into the lungs every 12 (twelve) hours.        Marland Kitchen HYDROcodone-acetaminophen (VICODIN) 5-500 MG per tablet Take 1 tablet by mouth At bedtime as needed.      . metformin (FORTAMET) 500 MG (OSM) 24 hr tablet Take 500 mg by mouth daily with breakfast.        . metoprolol tartrate (LOPRESSOR) 25 MG tablet Take 1 tablet by mouth Twice daily.      . Multiple Vitamin (MULTIVITAMIN PO) Take by mouth.        . Omega-3 Fatty Acids (FISH OIL PO) Take by mouth.        . potassium chloride (KLOR-CON) 20 MEQ packet  Take 20 mEq by mouth 2 (two) times daily.        . simvastatin (ZOCOR) 40 MG tablet Take 40 mg by mouth at bedtime.        . theophylline (THEO-24) 200 MG 24 hr capsule Take 1 capsule (200 mg total) by mouth 2 (two) times daily.  60 capsule  prn  . Warfarin Sodium (COUMADIN PO) Take by mouth.          Review of Systems ROS 10 system review of systems is performed and is negative except as described above.  Blood pressure 122/68, pulse 64, temperature 96.8 F (36 C), temperature source Temporal, height 4\' 11"  (1.499 m), weight 301 lb (136.533 kg).  Physical Exam Physical Exam  Patient is in a wheelchair. She has oxygen. She is in no distress. She is morbidly obese.  Neck no adenopathy no masses no jugular distention  Lungs clear to auscultation although breath sounds are distant. No chest wall tenderness.  Breasts - right breast is examined. The breast looks normal. All the incisions are well healed. There is no tenderness, no fluid collection, no signs of infection. The right axilla feels normal. There is no paresthesias  or numbness in the arm. Data Reviewed I reviewed all the office notes from her other physicians.  Assessment    Invasive mammary carcinoma, right breast, pathologic stage TIb., N0, receptor positive, HER-2/neu negative. Doing well 6 months following right partial mastectomy and sentinel node biopsy. No evidence of recurrence or wound complication.  Morbid obesity.  Recent right total knee replacement, recovering uneventfully.  Sleep apnea.  Asthma.  Paroxysmal atrial fibrillation on Coumadin.  Type 2 diabetes.    Plan    Bilateral mammograms will be performed in January of 2013.  I will see her back in February of 2013.  Continue regular followup with Dr. Drue Second and Dr. Holley Bouche.       [name] M 08/01/2011, 12:23 PM

## 2011-08-02 NOTE — Telephone Encounter (Signed)
Pt set to see TP on Friday. Carron Curie, CMA

## 2011-08-05 ENCOUNTER — Encounter: Payer: Self-pay | Admitting: Adult Health

## 2011-08-05 ENCOUNTER — Other Ambulatory Visit (INDEPENDENT_AMBULATORY_CARE_PROVIDER_SITE_OTHER): Payer: Medicare Other

## 2011-08-05 ENCOUNTER — Ambulatory Visit (INDEPENDENT_AMBULATORY_CARE_PROVIDER_SITE_OTHER)
Admission: RE | Admit: 2011-08-05 | Discharge: 2011-08-05 | Disposition: A | Discharge status: Home / Self Care | Payer: Medicare Other | Source: Ambulatory Visit | Attending: Adult Health | Admitting: Adult Health

## 2011-08-05 ENCOUNTER — Ambulatory Visit (INDEPENDENT_AMBULATORY_CARE_PROVIDER_SITE_OTHER): Payer: Medicare Other | Admitting: Adult Health

## 2011-08-05 DIAGNOSIS — J40 Bronchitis, not specified as acute or chronic: Secondary | ICD-10-CM | POA: Insufficient documentation

## 2011-08-05 DIAGNOSIS — I4891 Unspecified atrial fibrillation: Secondary | ICD-10-CM

## 2011-08-05 DIAGNOSIS — R0902 Hypoxemia: Secondary | ICD-10-CM

## 2011-08-05 DIAGNOSIS — R0989 Other specified symptoms and signs involving the circulatory and respiratory systems: Secondary | ICD-10-CM

## 2011-08-05 DIAGNOSIS — D869 Sarcoidosis, unspecified: Secondary | ICD-10-CM

## 2011-08-05 DIAGNOSIS — R0600 Dyspnea, unspecified: Secondary | ICD-10-CM

## 2011-08-05 LAB — BASIC METABOLIC PANEL
CO2: 32 mEq/L (ref 19–32)
Calcium: 9.3 mg/dL (ref 8.4–10.5)
Glucose, Bld: 127 mg/dL — ABNORMAL HIGH (ref 70–99)
Potassium: 4 mEq/L (ref 3.5–5.1)
Sodium: 141 mEq/L (ref 135–145)

## 2011-08-05 NOTE — Patient Instructions (Signed)
May wear Oxygen 2l/m with activity as needed and At bedtime   May get an oximeter -pulse ox. To check oxygen level goal is to be >90%.  I will call with xray and labs  follow up Dr. Maple Hudson  In 4 weeks and As needed   Please contact office for sooner follow up if symptoms do not improve or worsen or seek emergency care

## 2011-08-05 NOTE — Progress Notes (Signed)
Subjective:    Patient ID: [name], female    DOB: 23-Mar-1941, 70 y.o.   MRN: [id number]  HPI  06/13/11- 65 yoF never smoker, hx of OSA on CPAP Hx Sarcoid,, PAF-chronic coumadin , Morbid obesity   Last here December 21, 2010 - Note reviewed Blames heat for lack energy. She also had right breast lumpectomy and XRT in February. Now pending right knee replacement next week.  Got good report at cardiology last week, following for her AFIb. She had stopped ritalin as ineffective for complaints of tiredness before.  Continues CPAPat 9 cwp.   08/05/2011 Follow up  Pt presents today for follow up of O2. PT recently had R. TKR on 06/20/11 w/ rehab stay. She did have some desaturations post op and was started on o2 at 2l /m . Per pt she was discharged to rehab on o2. She had swelling in right lower leg and had venous doppler that was neg for DVT. She is on chronic coumadin for hx of a fib. She says she does wear out easily and has DOE. Does fine at rest w/ no dyspnea.  Wants to see if she can get off O2. Today in office O2 sat at rest was 94%. Walking O2 sat is 92% w/out desaturations.  Denies chest pain or hemoptysis. Weight is down 6 lbs since last ov.  No increased cough or congestion  Wearing CPAP each night  She was discharge home on 08/02/11 . No records from rehab available at todays visit. Will attempt to obtain.   Review of Systems  Constitutional:   No- weight loss, night sweats,  Fevers, chills, . HEENT:   No headaches,  Difficulty swallowing,  Tooth/dental problems,  Sore throat,                No sneezing, itching, ear ache, nasal congestion, post nasal drip,   CV:  No chest pain, orthopnea, PND,   anasarca, dizziness, palpitations  GI  No heartburn, indigestion, abdominal pain, nausea, vomiting, diarrhea, change in bowel habits, loss of appetite  Resp:    No excess mucus, no productive cough,  No non-productive cough,  No coughing up of blood.  No change in color of mucus.  No  wheezing.    Skin: no rash or lesions.  GU: no dysuria, change in color of urine, no urgency or frequency.  No flank pain.   Psych:  No change in mood or affect. No depression or anxiety.  No memory loss.      Objective:   Physical Exam   General- Alert, Oriented, Affect-appropriate, Distress- none acute   Very obese  Skin- rash-none, lesions- none, excoriation- none  Lymphadenopathy- none  Head- atraumatic  Eyes- Gross vision intact, PERRLA, conjunctivae clear secretions  Ears- Hearing, canals, Tm- normal  Nose- Clear, No- Septal dev, mucus, polyps, erosion, perforation   Throat- Mallampati II , mucosa clear , drainage- none, tonsils- atrophic  Neck- flexible , trachea midline, no stridor , thyroid nl, carotid no bruit  Chest - symmetrical excursion , unlabored     Heart/CV- RRR , no murmur , no gallop  , no rub, nl s1 s2      Pulse feels regular today                     - JVD- none , edema-1-2+ right > left , stasis changes bilateral  varices- none     Lung- clear to P&A, wheeze- none, cough- none , dullness-none,  rub- none     Chest wall-   Abd- tender-no, distended-no, bowel sounds-present, HSM- no  Br/ Gen/ Rectal- Not done, not indicated  Extrem- cyanosis- none, clubbing, none, atrophy- none, strength- nl  Neuro- grossly intact to observation         Assessment & Plan:

## 2011-08-05 NOTE — Assessment & Plan Note (Addendum)
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  Will look at cxr and labs with bnp  Cont on current regimen  follow up Dr. Maple Hudson  In 4 weeks

## 2011-08-08 ENCOUNTER — Other Ambulatory Visit: Payer: Self-pay | Admitting: Oncology

## 2011-08-08 ENCOUNTER — Encounter (HOSPITAL_BASED_OUTPATIENT_CLINIC_OR_DEPARTMENT_OTHER): Payer: Medicare Other | Admitting: Oncology

## 2011-08-08 ENCOUNTER — Telehealth: Payer: Self-pay | Admitting: Internal Medicine

## 2011-08-08 DIAGNOSIS — Z170 Estrogen receptor positive status [ER+]: Secondary | ICD-10-CM

## 2011-08-08 DIAGNOSIS — E119 Type 2 diabetes mellitus without complications: Secondary | ICD-10-CM

## 2011-08-08 DIAGNOSIS — C50419 Malignant neoplasm of upper-outer quadrant of unspecified female breast: Secondary | ICD-10-CM

## 2011-08-08 DIAGNOSIS — C50919 Malignant neoplasm of unspecified site of unspecified female breast: Secondary | ICD-10-CM

## 2011-08-08 DIAGNOSIS — C50119 Malignant neoplasm of central portion of unspecified female breast: Secondary | ICD-10-CM

## 2011-08-08 DIAGNOSIS — D649 Anemia, unspecified: Secondary | ICD-10-CM

## 2011-08-08 LAB — CBC WITH DIFFERENTIAL/PLATELET
Basophils Absolute: 0 10*3/uL (ref 0.0–0.1)
Eosinophils Absolute: 0.2 10*3/uL (ref 0.0–0.5)
HGB: 10.5 g/dL — ABNORMAL LOW (ref 11.6–15.9)
LYMPH%: 11.1 % — ABNORMAL LOW (ref 14.0–49.7)
MCV: 73.3 fL — ABNORMAL LOW (ref 79.5–101.0)
MONO#: 0.6 10*3/uL (ref 0.1–0.9)
MONO%: 8 % (ref 0.0–14.0)
NEUT#: 5.9 10*3/uL (ref 1.5–6.5)
Platelets: 321 10*3/uL (ref 145–400)

## 2011-08-08 LAB — COMPREHENSIVE METABOLIC PANEL
ALT: <8 U/L (ref 0–35)
AST: 12 U/L (ref 0–37)
CO2: 25 mEq/L (ref 19–32)
Chloride: 103 mEq/L (ref 96–112)
Sodium: 139 mEq/L (ref 135–145)
Total Bilirubin: 0.6 mg/dL (ref 0.3–1.2)
Total Protein: 6.1 g/dL (ref 6.0–8.3)

## 2011-08-08 LAB — IRON AND TIBC
%SAT: 13 % — ABNORMAL LOW (ref 20–55)
Iron: 35 ug/dL — ABNORMAL LOW (ref 42–145)
TIBC: 276 ug/dL (ref 250–470)

## 2011-08-08 NOTE — Telephone Encounter (Signed)
Spoke with pt and she states she was half asleep when she was advised of her xray and labs. ia dvised pt again of her results and she states this time she understood

## 2011-08-12 ENCOUNTER — Encounter (HOSPITAL_BASED_OUTPATIENT_CLINIC_OR_DEPARTMENT_OTHER): Payer: Medicare Other | Admitting: Oncology

## 2011-08-12 DIAGNOSIS — D509 Iron deficiency anemia, unspecified: Secondary | ICD-10-CM

## 2011-08-19 ENCOUNTER — Encounter (HOSPITAL_BASED_OUTPATIENT_CLINIC_OR_DEPARTMENT_OTHER): Payer: Medicare Other | Admitting: Oncology

## 2011-08-19 DIAGNOSIS — D509 Iron deficiency anemia, unspecified: Secondary | ICD-10-CM

## 2011-08-19 DIAGNOSIS — C50919 Malignant neoplasm of unspecified site of unspecified female breast: Secondary | ICD-10-CM

## 2011-09-16 ENCOUNTER — Other Ambulatory Visit: Payer: Self-pay | Admitting: Internal Medicine

## 2011-09-16 MED ORDER — THEOPHYLLINE ER 200 MG PO CP24: 200.0000 mg | ORAL_CAPSULE | Freq: Two times a day (BID) | ORAL | Status: DC

## 2011-09-16 NOTE — Telephone Encounter (Signed)
Refill request faxed from [facility] for Theophylline. [facility] the last prescription to [facility] [address] and the pt says she would prefer to get her medications at [facility] since they deliver. Last refill sent on 06/13/2011 for # 60 w/ prn refills. I will cancel the rx with [facility] and send a new rx to the correct pharmacy for the remainder of refills allowed per last prescription. Pt is aware.

## 2011-09-20 ENCOUNTER — Encounter: Payer: Self-pay | Admitting: *Deleted

## 2011-09-22 ENCOUNTER — Ambulatory Visit (INDEPENDENT_AMBULATORY_CARE_PROVIDER_SITE_OTHER): Payer: Medicare Other | Admitting: Internal Medicine

## 2011-09-22 ENCOUNTER — Encounter: Payer: Self-pay | Admitting: Internal Medicine

## 2011-09-22 VITALS — BP 132/76 | HR 88 | Ht 59.0 in | Wt 297.6 lb

## 2011-09-22 DIAGNOSIS — J9691 Respiratory failure, unspecified with hypoxia: Secondary | ICD-10-CM

## 2011-09-22 DIAGNOSIS — Z23 Encounter for immunization: Secondary | ICD-10-CM

## 2011-09-22 DIAGNOSIS — D869 Sarcoidosis, unspecified: Secondary | ICD-10-CM

## 2011-09-22 DIAGNOSIS — J9600 Acute respiratory failure, unspecified whether with hypoxia or hypercapnia: Secondary | ICD-10-CM

## 2011-09-22 DIAGNOSIS — G4730 Sleep apnea, unspecified: Secondary | ICD-10-CM

## 2011-09-22 DIAGNOSIS — E662 Morbid (severe) obesity with alveolar hypoventilation: Secondary | ICD-10-CM

## 2011-09-22 DIAGNOSIS — R609 Edema, unspecified: Secondary | ICD-10-CM

## 2011-09-22 NOTE — Progress Notes (Signed)
Patient ID: [name], female    DOB: April 05, 1941, 70 y.o.   MRN: [id number]  HPI  06/13/11- 70 yoF never smoker, hx of OSA on CPAP Hx Sarcoid,, PAF-chronic coumadin , Morbid obesity   Last here December 21, 2010 - Note reviewed Blames heat for lack energy. She also had right breast lumpectomy and XRT in February. Now pending right knee replacement next week.  Got good report at cardiology last week, following for her AFIb. She had stopped ritalin as ineffective for complaints of tiredness before.  Continues CPAPat 9 cwp.   08/05/2011 Follow up  Pt presents today for follow up of O2. PT recently had R. TKR on 06/20/11 w/ rehab stay. She did have some desaturations post op and was started on o2 at 2l /m . Per pt she was discharged to rehab on o2. She had swelling in right lower leg and had venous doppler that was neg for DVT. She is on chronic coumadin for hx of a fib. She says she does wear out easily and has DOE. Does fine at rest w/ no dyspnea.  Wants to see if she can get off O2. Today in office O2 sat at rest was 94%. Walking O2 sat is 92% w/out desaturations.  Denies chest pain or hemoptysis. Weight is down 6 lbs since last ov.  No increased cough or congestion  Wearing CPAP each night  She was discharge home on 08/02/11 . No records from rehab available at todays visit. Will attempt to obtain.   09/22/11- 06/13/11- 70 yoF never smoker, hx of OSA on CPAP Hx Sarcoid,, PAF-chronic coumadin , Morbid obesity She is staying on her oxygen most of the time. Consider comfortably on room air for a while at rest is is it for sleep and exertion. She is having difficulty getting around with a walker and managing her oxygen tank. Her home care company says it does not have a small portable type that would work for her. She continues her CPAP every night/[facility]. Says she has lost 22 pounds since her knee replacement surgery.    Review of Systems Constitutional:   +weight loss, no-night sweats,  fevers, chills, fatigue, lassitude. HEENT:   No-  headaches, difficulty swallowing, tooth/dental problems, sore throat,       No-  sneezing, itching, ear ache, nasal congestion, post nasal drip,  CV:  No-   chest pain, orthopnea, PND, swelling in lower extremities, anasarca,  dizziness, palpitations Resp: + shortness of breath with exertion, not at rest.              No-   productive cough,  No non-productive cough,  No-  coughing up of blood.              No-   change in color of mucus.  No- wheezing.   Skin: No-   rash or lesions. GI:  No-   heartburn, indigestion, abdominal pain, nausea, vomiting, diarrhea,                 change in bowel habits, loss of appetite GU: No-   dysuria, change in color of urine, no urgency or frequency.  No- flank pain. MS:  No-   joint pain or swelling.  No- decreased range of motion.  No- back pain. Neuro- grossly normal to observation, Or:  Psych:  No- change in mood or affect. No depression or anxiety.  No memory loss.        Objective:  Physical Exam General- Alert, Oriented, Affect-appropriate, Distress- none acute, morbidly obese, using a walker and portable oxygen Skin- rash-none, lesions- none, excoriation- none Lymphadenopathy- none Head- atraumatic            Eyes- Gross vision intact, PERRLA, conjunctivae clear secretions            Ears- Hearing, canals-normal            Nose- Clear, no-Septal dev, mucus, polyps, erosion, perforation             Throat- Mallampati II , mucosa clear , drainage- none, tonsils- atrophic Neck- flexible , trachea midline, no stridor , thyroid nl, carotid no bruit Chest - symmetrical excursion , unlabored           Heart/CV- rhythm is nearly regular and may be sinus , no murmur , no gallop  , no rub, nl s1 s2                           - JVD- none , 2-3+ edema bilaterally, stasis changes- none, varices- none           Lung- clear to P&A, wheeze- none, cough- none , dullness-none, rub- none           Chest wall-   Abd- tender-no, distended-no, bowel sounds-present, HSM- no Br/ Gen/ Rectal- Not done, not indicated Extrem- cyanosis- none, clubbing, none, atrophy- none, strength- nl Neuro- grossly intact to observation

## 2011-09-22 NOTE — Patient Instructions (Signed)
[facility]   [facility]  [facility]) Assess for light portable O2 2L/M  Let me know if [facility] can't provide

## 2011-09-24 DIAGNOSIS — J9691 Respiratory failure, unspecified with hypoxia: Secondary | ICD-10-CM | POA: Insufficient documentation

## 2011-09-24 DIAGNOSIS — E662 Morbid (severe) obesity with alveolar hypoventilation: Secondary | ICD-10-CM | POA: Insufficient documentation

## 2011-09-24 DIAGNOSIS — R609 Edema, unspecified: Secondary | ICD-10-CM | POA: Insufficient documentation

## 2011-09-24 NOTE — Assessment & Plan Note (Signed)
Etiology is not defined but a component of heart failure and a component of peripheral venous insufficiency would be likely.

## 2011-09-24 NOTE — Assessment & Plan Note (Signed)
Heart failure and obesity hypoventilation syndrome seem likely. Deconditioning is also important with exertion. We will evaluate for a more portable oxygen delivery system. She would like to consolidate her home care services through [facility].

## 2011-09-24 NOTE — Assessment & Plan Note (Signed)
In prolonged remission.

## 2011-09-24 NOTE — Assessment & Plan Note (Signed)
Good compliance and control. Weight loss would help.  

## 2011-09-24 NOTE — Assessment & Plan Note (Signed)
Chronic morbid obesity and deconditioning are a significant component of her hypoxic respiratory failure.

## 2011-10-01 ENCOUNTER — Other Ambulatory Visit: Payer: Self-pay | Admitting: Oncology

## 2011-10-01 DIAGNOSIS — D649 Anemia, unspecified: Secondary | ICD-10-CM

## 2011-10-01 DIAGNOSIS — C50919 Malignant neoplasm of unspecified site of unspecified female breast: Secondary | ICD-10-CM

## 2011-10-17 ENCOUNTER — Encounter: Payer: Self-pay | Admitting: Oncology

## 2011-10-17 ENCOUNTER — Other Ambulatory Visit: Payer: Self-pay | Admitting: Oncology

## 2011-10-17 ENCOUNTER — Ambulatory Visit (HOSPITAL_BASED_OUTPATIENT_CLINIC_OR_DEPARTMENT_OTHER): Payer: Medicare Other | Admitting: Oncology

## 2011-10-17 ENCOUNTER — Other Ambulatory Visit (HOSPITAL_BASED_OUTPATIENT_CLINIC_OR_DEPARTMENT_OTHER): Payer: Medicare Other | Admitting: Lab

## 2011-10-17 VITALS — BP 121/78 | HR 74 | Temp 97.6°F | Ht 61.0 in | Wt 290.6 lb

## 2011-10-17 DIAGNOSIS — D649 Anemia, unspecified: Secondary | ICD-10-CM

## 2011-10-17 DIAGNOSIS — Z170 Estrogen receptor positive status [ER+]: Secondary | ICD-10-CM

## 2011-10-17 DIAGNOSIS — C50919 Malignant neoplasm of unspecified site of unspecified female breast: Secondary | ICD-10-CM

## 2011-10-17 DIAGNOSIS — C50311 Malignant neoplasm of lower-inner quadrant of right female breast: Secondary | ICD-10-CM

## 2011-10-17 DIAGNOSIS — C50119 Malignant neoplasm of central portion of unspecified female breast: Secondary | ICD-10-CM

## 2011-10-17 DIAGNOSIS — D509 Iron deficiency anemia, unspecified: Secondary | ICD-10-CM

## 2011-10-17 HISTORY — DX: Malignant neoplasm of unspecified site of unspecified female breast: C50.919

## 2011-10-17 HISTORY — DX: Malignant neoplasm of lower-inner quadrant of right female breast: C50.311

## 2011-10-17 LAB — CBC WITH DIFFERENTIAL/PLATELET
BASO%: 0.2 % (ref 0.0–2.0)
EOS%: 3.6 % (ref 0.0–7.0)
HCT: 40.9 % (ref 34.8–46.6)
LYMPH%: 19.2 % (ref 14.0–49.7)
MCH: 25.3 pg (ref 25.1–34.0)
MCHC: 32.8 g/dL (ref 31.5–36.0)
MONO#: 0.6 10*3/uL (ref 0.1–0.9)
MONO%: 8.7 % (ref 0.0–14.0)
NEUT%: 68.3 % (ref 38.4–76.8)
Platelets: 237 10*3/uL (ref 145–400)
RBC: 5.3 10*6/uL (ref 3.70–5.45)
WBC: 6.5 10*3/uL (ref 3.9–10.3)

## 2011-10-17 LAB — COMPREHENSIVE METABOLIC PANEL
Albumin: 4.2 g/dL (ref 3.5–5.2)
Alkaline Phosphatase: 75 U/L (ref 39–117)
BUN: 15 mg/dL (ref 6–23)
CO2: 31 mEq/L (ref 19–32)
Calcium: 9.9 mg/dL (ref 8.4–10.5)
Chloride: 102 mEq/L (ref 96–112)
Glucose, Bld: 136 mg/dL — ABNORMAL HIGH (ref 70–99)
Potassium: 4.1 mEq/L (ref 3.5–5.3)
Sodium: 140 mEq/L (ref 135–145)
Total Protein: 6.9 g/dL (ref 6.0–8.3)

## 2011-10-17 LAB — FERRITIN: Ferritin: 65 ng/mL (ref 10–291)

## 2011-10-17 LAB — IRON AND TIBC: UIBC: 220 ug/dL (ref 125–400)

## 2011-10-17 MED ORDER — EXEMESTANE 25 MG PO TABS: 25.0000 mg | ORAL_TABLET | Freq: Every day | ORAL | Status: DC

## 2011-10-17 MED ORDER — FERROUS GLUCONATE IRON 246 (28 FE) MG PO TABS: 246.0000 mg | ORAL_TABLET | Freq: Every day | ORAL | Status: DC

## 2011-10-17 NOTE — Progress Notes (Signed)
Hematology and Oncology Follow Up Visit  [name] [id number] [birth year] 70 y.o. 10/17/2011 5:24 PM   DIAGNOSIS:   Encounter Diagnoses  Name Primary?  . Breast cancer, stage 1 Yes  . Anemia      PAST THERAPY:  #1 patient is a lumpectomy of the right breast in May 2012. 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 2 sentinel nodes were negative for metastatic disease.  #2 she then went on to receive radiation therapy to the breast from 03/28/2011 to 04/25/2011.  #3 she is now status post right knee replacement and is getting physical therapy.  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  Interim History:  Patient is seen in followup today overall she seems to be doing well. She tolerated her knee replacement quite well. We have tried to give her parenteral iron however she had a reaction to that. And so she is now on oral iron which she is tolerating quite well. She was accompanied by her son today who tells me that her mother his mother is doing quite well. Few months back patient had called [place] and stated that she was having a lot of aches and pains from the Arimidex and so it was discontinued. Thus she has been off of all treatment for her breast cancer. She denies having any nausea vomiting fevers chills night sweats headaches shortness of breath chest pains palpitations. She is still using a walker. Remainder of the  review of systems is negative.  Medications: Continuous: Current Outpatient Prescriptions on File Prior to Visit  Medication Sig Dispense Refill  . albuterol (PROVENTIL HFA) 108 (90 BASE) MCG/ACT inhaler Inhale 2 puffs into the lungs every 4 (four) hours as needed for wheezing or shortness of breath.  1 Inhaler  prn  . albuterol (PROVENTIL) (2.5 MG/3ML) 0.083% nebulizer  solution Take 3 mLs (2.5 mg total) by nebulization 4 (four) times daily as needed.  360 mL  3  . Ascorbic Acid (VITAMIN C PO) Take by mouth.        . diltiazem (TIAZAC) 360 MG 24 hr capsule Take 1 tablet by mouth Daily.      . ferrous fumarate-iron polysaccharide complex (TANDEM) 162-115.2 MG CAPS Take 1 capsule by mouth daily with breakfast.        . Fluticasone-Salmeterol (ADVAIR DISKUS) 250-50 MCG/DOSE AEPB Inhale 1 puff into the lungs every 12 (twelve) hours.        . metformin (FORTAMET) 500 MG (OSM) 24 hr tablet Take 500 mg by mouth 2 (two) times daily with a meal.       . metoprolol tartrate (LOPRESSOR) 25 MG tablet Take 1 tablet by mouth Twice daily.      . Multiple Vitamin (MULTIVITAMIN PO) Take by mouth.        . potassium chloride (KLOR-CON) 20 MEQ packet Take 20 mEq by mouth 2 (two) times daily.        . simvastatin (ZOCOR) 10 MG tablet Take 10 mg by mouth at bedtime.        . theophylline (THEO-24) 200 MG 24 hr capsule Take 1 capsule (200 mg total) by mouth 2 (two) times daily.  60 capsule  8  . Warfarin Sodium (COUMADIN PO) Take 4 mg by mouth.       . Calcium Carbonate-Vitamin D (CALCIUM 600 + D PO) Take by  mouth 2 (two) times daily.       Marland Kitchen exemestane (AROMASIN) 25 MG tablet Take 1 tablet (25 mg total) by mouth daily after breakfast.  30 tablet  12  . fexofenadine (ALLEGRA) 180 MG tablet Take 180 mg by mouth daily. Take one daily as needed      . Omega-3 Fatty Acids (FISH OIL PO) Take by mouth.          Allergies:  Allergies  Allergen Reactions  . Pravachol   . Pravastatin Sodium     Past Medical History, Surgical history, Social history, and Family History were reviewed and updated.  Review of Systems: Constitutional:  Negative for fever, chills, night sweats, anorexia, weight loss, pain. Cardiovascular: no chest pain or dyspnea on exertion Respiratory: no cough, shortness of breath, or wheezing Neurological: no TIA or stroke symptoms Dermatological: negative ENT:  negative Skin Gastrointestinal: no abdominal pain, change in bowel habits, or black or bloody stools Genito-Urinary: no dysuria, trouble voiding, or hematuria Hematological and Lymphatic: negative Breast: negative for breast lumps Musculoskeletal: Patient does complain of having some aches and pains in her knees which is recovering. Remaining ROS negative.  Physical Exam:General appearance: alert, cooperative, appears stated age and fatigued  Blood pressure 121/78, pulse 74, temperature 97.6 F (36.4 C), temperature source Oral, height 5\' 1"  (1.549 m), weight 290 lb 9.6 oz (131.815 kg). ENT exam is unremarkable l LUNGS: are clear bilaterally to auscultation and percussion CVS: rate rhythm no murmurs gallops or rubs ABD: Soft nontender nondistended bowel sounds are present hepato/splenomegaly EXT: No edema clubbing or cyanosis BREAST: The lateral breasts reveal no masses nipple discharge. Well-healed surgical scar in the right breast NEURO: Alert oriented x3. DTR +4 motor and sensory is intact strength is symmetrical in upper and lower extremity  ECOG: 1   Lab Results: Lab Results  Component Value Date   WBC 13.5* 06/23/2011   HGB 13.4 10/17/2011   HCT 40.9 10/17/2011   MCV 77.2* 10/17/2011   PLT 237 10/17/2011     Chemistry      Component Value Date/Time   NA 139 08/08/2011 0932   NA 139 08/08/2011 0932   K 4.1 08/08/2011 0932   K 4.1 08/08/2011 0932   CL 103 08/08/2011 0932   CL 103 08/08/2011 0932   CO2 25 08/08/2011 0932   CO2 25 08/08/2011 0932   BUN 11 08/08/2011 0932   BUN 11 08/08/2011 0932   CREATININE 0.75 08/08/2011 0932   CREATININE 0.75 08/08/2011 0932      Component Value Date/Time   CALCIUM 9.5 08/08/2011 0932   CALCIUM 9.5 08/08/2011 0932   ALKPHOS 77 08/08/2011 0932   ALKPHOS 77 08/08/2011 0932   AST 12 08/08/2011 0932   AST 12 08/08/2011 0932   ALT <8 08/08/2011 0932   ALT <8 08/08/2011 0932   BILITOT 0.6 08/08/2011 0932   BILITOT 0.6 08/08/2011 0932        Radiological Studies: NONE     IMPRESSIONS AND PLAN: A 70 y.o. 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 She will now begin Aromasin 25 mg daily.    #2 anemia patient will continue tandem one daily for about 6 months time.  #3 patient will return to see me in 6 months time or sooner if need arises   Spent more than half the time coordinating care.    Drue Second, MD Medical/Oncology [facility] [phone] (beeper) [phone] (  Office)  10/17/2011, 5:24 PM 11/5/20125:24 PM

## 2011-10-18 ENCOUNTER — Telehealth: Payer: Self-pay | Admitting: *Deleted

## 2011-10-18 NOTE — Telephone Encounter (Signed)
Per MD, notified pt to continue taking oral iron supplements

## 2011-11-11 ENCOUNTER — Telehealth: Payer: Self-pay | Admitting: Oncology

## 2011-11-11 NOTE — Telephone Encounter (Signed)
pt called and r/s appts on 02/08 to 01/19/2012

## 2011-12-15 ENCOUNTER — Encounter: Payer: Self-pay | Admitting: Internal Medicine

## 2011-12-15 ENCOUNTER — Ambulatory Visit (INDEPENDENT_AMBULATORY_CARE_PROVIDER_SITE_OTHER)
Admission: RE | Admit: 2011-12-15 | Discharge: 2011-12-15 | Disposition: A | Discharge status: Home / Self Care | Payer: Medicare Other | Source: Ambulatory Visit | Attending: Internal Medicine | Admitting: Internal Medicine

## 2011-12-15 ENCOUNTER — Ambulatory Visit (INDEPENDENT_AMBULATORY_CARE_PROVIDER_SITE_OTHER): Payer: Medicare Other | Admitting: Internal Medicine

## 2011-12-15 VITALS — BP 126/72 | HR 83 | Ht 59.0 in | Wt 276.0 lb

## 2011-12-15 DIAGNOSIS — G4730 Sleep apnea, unspecified: Secondary | ICD-10-CM

## 2011-12-15 DIAGNOSIS — J40 Bronchitis, not specified as acute or chronic: Secondary | ICD-10-CM

## 2011-12-15 DIAGNOSIS — J069 Acute upper respiratory infection, unspecified: Secondary | ICD-10-CM

## 2011-12-15 DIAGNOSIS — D869 Sarcoidosis, unspecified: Secondary | ICD-10-CM

## 2011-12-15 MED ORDER — PHENYLEPHRINE HCL 1 % NA SOLN
3.0000 [drp] | Freq: Once | NASAL | Status: AC
  Administered 2011-12-15: 3 [drp] via NASAL

## 2011-12-15 MED ORDER — METHYLPREDNISOLONE ACETATE 80 MG/ML IJ SUSP
80.0000 mg | Freq: Once | INTRAMUSCULAR | Status: AC
  Administered 2011-12-15: 80 mg via INTRAMUSCULAR

## 2011-12-15 NOTE — Patient Instructions (Signed)
Order CXR- dx bronchitis  Neb neo nasal  Depo 80  Ask at pharmacy counter for Mucinex-D to help thin mucus and decongest your head

## 2011-12-15 NOTE — Progress Notes (Signed)
Patient ID: [name], female    DOB: December 21, 1940, 71 y.o.   MRN: [id number]  HPI  06/13/11- 70 yoF never smoker, hx of OSA on CPAP Hx Sarcoid,, PAF-chronic coumadin , Morbid obesity   Last here December 21, 2010 - Note reviewed Blames heat for lack energy. She also had right breast lumpectomy and XRT in February. Now pending right knee replacement next week.  Got good report at cardiology last week, following for her AFIb. She had stopped ritalin as ineffective for complaints of tiredness before.  Continues CPAPat 9 cwp.   08/05/2011 Follow up  Pt presents today for follow up of O2. PT recently had R. TKR on 06/20/11 w/ rehab stay. She did have some desaturations post op and was started on o2 at 2l /m . Per pt she was discharged to rehab on o2. She had swelling in right lower leg and had venous doppler that was neg for DVT. She is on chronic coumadin for hx of a fib. She says she does wear out easily and has DOE. Does fine at rest w/ no dyspnea.  Wants to see if she can get off O2. Today in office O2 sat at rest was 94%. Walking O2 sat is 92% w/out desaturations.  Denies chest pain or hemoptysis. Weight is down 6 lbs since last ov.  No increased cough or congestion  Wearing CPAP each night  She was discharge home on 08/02/11 . No records from rehab available at todays visit. Will attempt to obtain.   09/22/11-69 yoF never smoker, hx of OSA on CPAP Hx Sarcoid,, PAF-chronic coumadin , Morbid obesity She is staying on her oxygen most of the time. Consider comfortably on room air for a while at rest is is it for sleep and exertion. She is having difficulty getting around with a walker and managing her oxygen tank. Her home care company says it does not have a small portable type that would work for her. She continues her CPAP every night/[facility]. Says she has lost 22 pounds since her knee replacement surgery.  1//3/12- - 69 yoF never smoker, hx of OSA on CPAP Hx Sarcoid,, PAF-chronic coumadin ,  Morbid obesity Still having SOB, slight wheezing at times. cough-productive-clear in color; denies any fever and chills She got portable oxygen/[facility]. Comfortable with her CPAP and using it every night 3 at Has felt sick since Thanksgiving. Now she just doesn't feel she can clear her airways of mucus. She took 2 rounds of antibiotics. Using her nebulizer machine.  Review of Systems Constitutional:   No-weight loss, night sweats, fevers, chills, fatigue, lassitude. HEENT:   No-  headaches, difficulty swallowing, tooth/dental problems, sore throat,       No-  sneezing, itching, ear ache, nasal congestion, post nasal drip,  CV:  No-   chest pain, orthopnea, PND, swelling in lower extremities, anasarca,  dizziness, palpitations Resp: + shortness of breath with exertion, not at rest.              No-   productive cough,  No non-productive cough,  No-  coughing up of blood.              No-   change in color of mucus.  No- wheezing.   Skin: No-   rash or lesions. GI:  No-   heartburn, indigestion, abdominal pain, nausea, vomiting, diarrhea,                 change in bowel habits, loss  of appetite GU: No-   dysuria, change in color of urine, no urgency or frequency.  No- flank pain. MS:  No-   joint pain or swelling.  No- decreased range of motion.  No- back pain. Neuro- grossly normal to observation, Or:  Psych:  No- change in mood or affect. No depression or anxiety.  No memory loss.   Objective:   Physical Exam General- Alert, Oriented, Affect-appropriate, Distress- none acute, morbidly obese, using a walker and portable oxygen Skin- rash-none, lesions- none, excoriation- none Lymphadenopathy- none Head- atraumatic            Eyes- Gross vision intact, PERRLA, conjunctivae clear secretions            Ears- Hearing, canals-normal            Nose- Clear, no-Septal dev, mucus, polyps, erosion, perforation             Throat- Mallampati II , mucosa clear , drainage- none, tonsils-  atrophic Neck- flexible , trachea midline, no stridor , thyroid nl, carotid no bruit Chest - symmetrical excursion , unlabored           Heart/CV- rhythm is nearly regular and may be sinus , no murmur , no gallop  , no rub, nl s1 s2                           - JVD- none , 2-3+ edema bilaterally, stasis changes- none, varices- none           Lung-  wheeze- R>L, cough- none , dullness-none, rub- none           Chest wall-  Abd- tender-no, distended-no, bowel sounds-present, HSM- no Br/ Gen/ Rectal- Not done, not indicated Extrem- cyanosis- none, clubbing, none, atrophy- none, strength- nl Neuro- grossly intact to observation

## 2011-12-17 DIAGNOSIS — J069 Acute upper respiratory infection, unspecified: Secondary | ICD-10-CM | POA: Insufficient documentation

## 2011-12-17 NOTE — Assessment & Plan Note (Signed)
Good CPAP compliance and control. 

## 2011-12-17 NOTE — Assessment & Plan Note (Signed)
Acute rhinosinusitis with eustachian dysfunction and bronchitis Plan-chest x-ray, Neo-Synephrine inhalation, Depo-Medrol, Mucinex D.

## 2011-12-17 NOTE — Assessment & Plan Note (Signed)
Long term remission 

## 2011-12-22 ENCOUNTER — Encounter (HOSPITAL_COMMUNITY): Payer: Self-pay | Admitting: Emergency Medicine

## 2011-12-22 ENCOUNTER — Emergency Department (HOSPITAL_COMMUNITY)
Admission: EM | Admit: 2011-12-22 | Discharge: 2011-12-23 | Disposition: A | Discharge status: Home / Self Care | Payer: Medicare Other | Attending: Emergency Medicine | Admitting: Emergency Medicine

## 2011-12-22 ENCOUNTER — Emergency Department (HOSPITAL_COMMUNITY): Payer: Medicare Other

## 2011-12-22 DIAGNOSIS — G4733 Obstructive sleep apnea (adult) (pediatric): Secondary | ICD-10-CM | POA: Insufficient documentation

## 2011-12-22 DIAGNOSIS — R1110 Vomiting, unspecified: Secondary | ICD-10-CM | POA: Insufficient documentation

## 2011-12-22 DIAGNOSIS — I4891 Unspecified atrial fibrillation: Secondary | ICD-10-CM | POA: Insufficient documentation

## 2011-12-22 DIAGNOSIS — J45909 Unspecified asthma, uncomplicated: Secondary | ICD-10-CM | POA: Insufficient documentation

## 2011-12-22 DIAGNOSIS — E119 Type 2 diabetes mellitus without complications: Secondary | ICD-10-CM | POA: Insufficient documentation

## 2011-12-22 DIAGNOSIS — R42 Dizziness and giddiness: Secondary | ICD-10-CM | POA: Insufficient documentation

## 2011-12-22 DIAGNOSIS — Z853 Personal history of malignant neoplasm of breast: Secondary | ICD-10-CM | POA: Insufficient documentation

## 2011-12-22 LAB — DIFFERENTIAL
Basophils Absolute: 0 10*3/uL (ref 0.0–0.1)
Basophils Relative: 0 % (ref 0–1)
Eosinophils Absolute: 0 10*3/uL (ref 0.0–0.7)
Eosinophils Relative: 0 % (ref 0–5)
Monocytes Absolute: 0.6 10*3/uL (ref 0.1–1.0)
Neutro Abs: 9.3 10*3/uL — ABNORMAL HIGH (ref 1.7–7.7)

## 2011-12-22 LAB — CBC
HCT: 44.3 % (ref 36.0–46.0)
MCH: 26.4 pg (ref 26.0–34.0)
MCHC: 33.4 g/dL (ref 30.0–36.0)
RDW: 15.9 % — ABNORMAL HIGH (ref 11.5–15.5)

## 2011-12-22 LAB — COMPREHENSIVE METABOLIC PANEL
AST: 16 U/L (ref 0–37)
Albumin: 4.2 g/dL (ref 3.5–5.2)
Calcium: 9.7 mg/dL (ref 8.4–10.5)
Creatinine, Ser: 0.72 mg/dL (ref 0.50–1.10)
Total Protein: 7.8 g/dL (ref 6.0–8.3)

## 2011-12-22 LAB — LIPASE, BLOOD: Lipase: 35 U/L (ref 11–59)

## 2011-12-22 MED ORDER — ONDANSETRON HCL 4 MG PO TABS: 4.0000 mg | ORAL_TABLET | Freq: Four times a day (QID) | ORAL | Status: AC

## 2011-12-22 MED ORDER — ONDANSETRON HCL 4 MG/2ML IJ SOLN
4.0000 mg | Freq: Once | INTRAMUSCULAR | Status: AC
  Administered 2011-12-22: 4 mg via INTRAVENOUS
  Filled 2011-12-22: qty 2

## 2011-12-22 MED ORDER — SODIUM CHLORIDE 0.9 % IV SOLN
Freq: Once | INTRAVENOUS | Status: AC
  Administered 2011-12-22: 23:00:00 via INTRAVENOUS

## 2011-12-22 MED ORDER — KETOROLAC TROMETHAMINE 30 MG/ML IJ SOLN
30.0000 mg | Freq: Once | INTRAMUSCULAR | Status: AC
  Administered 2011-12-22: 30 mg via INTRAVENOUS
  Filled 2011-12-22: qty 1

## 2011-12-22 MED ORDER — ONDANSETRON HCL 4 MG PO TABS: 4.0000 mg | ORAL_TABLET | Freq: Four times a day (QID) | ORAL | Status: DC

## 2011-12-22 NOTE — ED Notes (Signed)
Pt presented to the ER with/o cough, dizziness, n/v and s/s of URI. Pt states that symptoms started just after the Thanksgiving, and that since then pt is "batteling" this, pt further states that her appetite decrease, not able to hold food, states doing better with fluids. Pt also states that after this incident this evening she felt as if she will "pass out".

## 2011-12-22 NOTE — ED Notes (Signed)
Pt reports onset of n/v this evening approx 1700 s/p eating tacos - pt denies any diarrhea or fever. Pt denies any abd pain at present. BS present x4 quads. Abd round, soft, and nontender.

## 2011-12-22 NOTE — ED Provider Notes (Addendum)
History     CSN: [id number]  Arrival date & time 12/22/11  1859   First MD Initiated Contact with Patient 12/22/11 2148      Chief Complaint  Patient presents with  . Dizziness  . Emesis  . URI    (Consider location/radiation/quality/duration/timing/severity/associated sxs/prior treatment) HPI Comments: Started suddenly with nausea and vomiting after eating a taco from [employer].  Has had multiple episodes of this since it started.    Patient is a 71 y.o. female presenting with vomiting.  Emesis  This is a new problem. The current episode started 3 to 5 hours ago. The problem has been gradually improving. The emesis has an appearance of stomach contents. There has been no fever. Associated symptoms include chills. Pertinent negatives include no abdominal pain, no diarrhea and no fever.    Past Medical History  Diagnosis Date  . Diabetes mellitus   . Asthma   . Bronchitis   . Hernia   . Anemia   . Cancer   . Arthritis   . Hernia   . Morbid obesity   . Sarcoid   . OSA (obstructive sleep apnea)   . Atrial fibrillation   . Breast cancer, stage 1 10/17/2011    Past Surgical History  Procedure Date  . Total knee arthroplasty 2001  . Abdominal hysterectomy 2006  . Resection of uterine cancer   . Left lower leg fx--casted     Family History  Problem Relation Age of Onset  . Diabetes Mother   . Cancer Father   . Diabetes Brother   . Cancer Sister   . Diabetes Sister     History  Substance Use Topics  . Smoking status: Never Smoker   . Smokeless tobacco: Not on file  . Alcohol Use: No    OB History    Grav Para Term Preterm Abortions TAB SAB Ect Mult Living                  Review of Systems  Constitutional: Positive for chills. Negative for fever.  Gastrointestinal: Positive for vomiting. Negative for abdominal pain and diarrhea.    Allergies  Pravachol and Pravastatin sodium  Home Medications   Current Outpatient Rx  Name Route Sig Dispense  Refill  . ALBUTEROL SULFATE HFA 108 (90 BASE) MCG/ACT IN AERS Inhalation Inhale 2 puffs into the lungs every 4 (four) hours as needed for wheezing or shortness of breath. 1 Inhaler prn  . ALBUTEROL SULFATE (2.5 MG/3ML) 0.083% IN NEBU Nebulization Take 3 mLs (2.5 mg total) by nebulization 4 (four) times daily as needed. 360 mL 3  . VITAMIN C PO Oral Take by mouth.      Marland Kitchen CALCIUM 600 + D PO Oral Take by mouth 2 (two) times daily.     Marland Kitchen DILTIAZEM HCL ER BEADS 360 MG PO CP24 Oral Take 1 tablet by mouth Daily.    Marland Kitchen EXEMESTANE 25 MG PO TABS Oral Take 1 tablet (25 mg total) by mouth daily after breakfast. 30 tablet 12  . FERROUS GLUCONATE IRON 246 (28 FE) MG PO TABS Oral Take 1 tablet (246 mg total) by mouth daily with breakfast. 30 tablet 6  . FEXOFENADINE HCL 180 MG PO TABS Oral Take 180 mg by mouth daily. Take one daily as needed    . FLUTICASONE-SALMETEROL 250-50 MCG/DOSE IN AEPB Inhalation Inhale 1 puff into the lungs every 12 (twelve) hours.      Marland Kitchen METFORMIN HCL ER (OSM) 500 MG PO TB24  Oral Take 500 mg by mouth 2 (two) times daily with a meal.     . METOPROLOL TARTRATE 25 MG PO TABS Oral Take 1 tablet by mouth Twice daily.    . MULTIVITAMIN PO Oral Take by mouth.      Marland Kitchen FISH OIL PO Oral Take by mouth.      Marland Kitchen POTASSIUM CHLORIDE 20 MEQ PO PACK Oral Take 20 mEq by mouth 2 (two) times daily.      Marland Kitchen SIMVASTATIN 10 MG PO TABS Oral Take 10 mg by mouth at bedtime.      . THEOPHYLLINE 200 MG PO CP24 Oral Take 1 capsule (200 mg total) by mouth 2 (two) times daily. 60 capsule 8  . COUMADIN PO Oral Take 4 mg by mouth.       BP 118/60  Pulse 66  Temp(Src) 97.5 F (36.4 C) (Oral)  Resp 15  SpO2 96%  Physical Exam  Nursing note and vitals reviewed. Constitutional: She is oriented to person, place, and time. She appears well-developed and well-nourished. No distress.  HENT:  Head: Normocephalic and atraumatic.  Neck: Normal range of motion. Neck supple.  Cardiovascular: Normal rate and regular  rhythm.  Exam reveals no gallop and no friction rub.   No murmur heard. Pulmonary/Chest: Effort normal and breath sounds normal. No respiratory distress. She has no wheezes.  Abdominal: Soft. Bowel sounds are normal. She exhibits no distension. There is no tenderness.  Musculoskeletal: Normal range of motion.  Neurological: She is alert and oriented to person, place, and time.  Skin: Skin is warm and dry. She is not diaphoretic.    ED Course  Procedures (including critical care time)  Labs Reviewed - No data to display No results found.   No diagnosis found.    MDM  Labs, chest xray look okay.  The xray shows interstitial pattern that I believe is most compatible with her history of Sarcoidosis.  No further vomiting or diarrhea.  Will discharge with anti-emetics, follow up prn.        Geoffery Lyons, MD 12/22/11 4010  Geoffery Lyons, MD 02/01/12 1212

## 2011-12-23 NOTE — ED Notes (Signed)
Pt d/c'd by Alvino Chapel, RN

## 2012-01-10 ENCOUNTER — Other Ambulatory Visit: Payer: Self-pay

## 2012-01-11 NOTE — Progress Notes (Signed)
Quick Note:  Inform patient of Pathology report,. ______ 

## 2012-01-12 ENCOUNTER — Telehealth (INDEPENDENT_AMBULATORY_CARE_PROVIDER_SITE_OTHER): Payer: Self-pay

## 2012-01-12 NOTE — Telephone Encounter (Signed)
Pt notified of path result per Dr Ingram's request. 

## 2012-01-13 ENCOUNTER — Encounter (INDEPENDENT_AMBULATORY_CARE_PROVIDER_SITE_OTHER): Payer: Self-pay | Admitting: General Surgery

## 2012-01-19 ENCOUNTER — Encounter: Payer: Self-pay | Admitting: Oncology

## 2012-01-19 ENCOUNTER — Other Ambulatory Visit (HOSPITAL_BASED_OUTPATIENT_CLINIC_OR_DEPARTMENT_OTHER): Payer: Medicare Other | Admitting: Lab

## 2012-01-19 ENCOUNTER — Telehealth: Payer: Self-pay | Admitting: Oncology

## 2012-01-19 ENCOUNTER — Ambulatory Visit (HOSPITAL_BASED_OUTPATIENT_CLINIC_OR_DEPARTMENT_OTHER): Payer: Medicare Other | Admitting: Oncology

## 2012-01-19 VITALS — BP 131/81 | HR 80 | Temp 97.9°F | Ht 61.0 in | Wt 279.2 lb

## 2012-01-19 DIAGNOSIS — C50919 Malignant neoplasm of unspecified site of unspecified female breast: Secondary | ICD-10-CM

## 2012-01-19 DIAGNOSIS — D509 Iron deficiency anemia, unspecified: Secondary | ICD-10-CM

## 2012-01-19 LAB — COMPREHENSIVE METABOLIC PANEL
ALT: 13 U/L (ref 0–35)
Albumin: 4.3 g/dL (ref 3.5–5.2)
CO2: 27 mEq/L (ref 19–32)
Glucose, Bld: 221 mg/dL — ABNORMAL HIGH (ref 70–99)
Potassium: 4 mEq/L (ref 3.5–5.3)
Sodium: 138 mEq/L (ref 135–145)
Total Protein: 7 g/dL (ref 6.0–8.3)

## 2012-01-19 LAB — CBC WITH DIFFERENTIAL/PLATELET
BASO%: 0.3 % (ref 0.0–2.0)
Eosinophils Absolute: 0.2 10*3/uL (ref 0.0–0.5)
MCHC: 33.2 g/dL (ref 31.5–36.0)
MCV: 81.1 fL (ref 79.5–101.0)
MONO#: 0.5 10*3/uL (ref 0.1–0.9)
MONO%: 5.6 % (ref 0.0–14.0)
NEUT#: 6.3 10*3/uL (ref 1.5–6.5)
RBC: 5.27 10*6/uL (ref 3.70–5.45)
RDW: 19.3 % — ABNORMAL HIGH (ref 11.2–14.5)
WBC: 8.5 10*3/uL (ref 3.9–10.3)

## 2012-01-19 NOTE — Progress Notes (Signed)
Hematology and Oncology Follow Up Visit  [name] [id number] 1941-08-27 71 y.o. 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  #2 iron deficiency anemia   PAST THERAPY:  #1 patient is a lumpectomy of the right breast in May 2012. 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 2 sentinel nodes were negative for metastatic disease.  #2 she then went on to receive radiation therapy to the breast from 03/28/2011 to 04/25/2011.  #3 she is now status post right knee replacement and is getting physical therapy.  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  Interim History:  Patient is seen in followup today overall she seems to be doing well.Overall she is doing well. She is on Aromasin which she tolerates much better than the Arimidex that I had originally prescribed. She today feels well she is using a walker. She denies any fevers chills night sweats headaches shortness of breath chest pains palpitations she has no myalgias or arthralgias she does experience some hot flashes she has no bleeding remainder of the 10 point review of systems is negative.  Medications: Continuous: Current Outpatient Prescriptions on File Prior to Visit  Medication Sig Dispense Refill  . acetaminophen (TYLENOL) 500 MG tablet Take 1,000 mg by mouth every 6 (six) hours as needed. pain      . albuterol (PROVENTIL HFA) 108 (90 BASE) MCG/ACT inhaler Inhale 2 puffs into the lungs every 4 (four) hours as needed for wheezing or shortness of breath.  1 Inhaler  prn  . albuterol (PROVENTIL) (2.5 MG/3ML) 0.083% nebulizer solution Take 2.5 mg by nebulization 4 (four) times daily as needed.  Shortness of breath/ wheezing      . Ascorbic Acid (VITAMIN C PO) Take by mouth.        . Calcium Carbonate-Vitamin D (CALCIUM 600 + D PO) Take by mouth 2 (two) times daily.       Marland Kitchen diltiazem (TIAZAC) 360 MG 24 hr capsule Take 1 tablet by mouth Daily.      Marland Kitchen exemestane (AROMASIN) 25 MG tablet Take 1 tablet (25 mg total) by mouth daily after breakfast.  30 tablet  12  . ferrous gluconate (FERGON) 246 (28 FE) MG tablet Take 1 tablet (246 mg total) by mouth daily with breakfast.  30 tablet  6  . Fluticasone-Salmeterol (ADVAIR DISKUS) 250-50 MCG/DOSE AEPB Inhale 1 puff into the lungs every 12 (twelve) hours.       . metformin (FORTAMET) 500 MG (OSM) 24 hr tablet Take 500 mg by mouth 2 (two) times daily as needed. If blood sugar is high then she takes 1 tablet      . metoprolol tartrate (LOPRESSOR) 25 MG tablet Take 25 mg by mouth Twice daily.       . Multiple Vitamin (MULTIVITAMIN PO) Take by mouth.        . potassium chloride (KLOR-CON) 20 MEQ packet Take 20 mEq by mouth 2 (two) times daily.        . simvastatin (ZOCOR) 10 MG tablet Take 10 mg by mouth at bedtime.       . theophylline (THEO-24) 200 MG 24 hr capsule Take 1 capsule (200 mg total) by mouth 2 (two) times daily.  60 capsule  8  . warfarin (COUMADIN) 4 MG tablet Take 4-5 mg by mouth daily. Takes 1 tablet daily on sundays, tuesdays, thursdays, and saturdays for 4mg  dosage, then takes 1 and 1/2 tablet on mondays, wednesdays, and fridays for 5mg  dosage        Allergies:  Allergies  Allergen Reactions  . Pravachol Other (See Comments)    Muscle pain  . Pravastatin Sodium     Past Medical History, Surgical history, Social history, and Family History were reviewed and updated.  Review of Systems: Constitutional:  Negative for fever, chills, night sweats, anorexia, weight loss, pain. Cardiovascular: no chest pain or dyspnea on exertion Respiratory: no cough, shortness of breath, or wheezing Neurological: no TIA or stroke  symptoms Dermatological: negative ENT: negative Skin Gastrointestinal: no abdominal pain, change in bowel habits, or black or bloody stools Genito-Urinary: no dysuria, trouble voiding, or hematuria Hematological and Lymphatic: negative Breast: negative for breast lumps Musculoskeletal: Patient does complain of having some aches and pains in her knees which is recovering. Remaining ROS negative.  Physical Exam:General appearance: alert, cooperative, appears stated age and fatigued  Blood pressure 131/81, pulse 80, temperature 97.9 F (36.6 C), temperature source Oral, height 5\' 1"  (1.549 m), weight 279 lb 3.2 oz (126.644 kg). ENT exam is unremarkable l LUNGS: are clear bilaterally to auscultation and percussion CVS: rate rhythm no murmurs gallops or rubs ABD: Soft nontender nondistended bowel sounds are present hepato/splenomegaly EXT: No edema clubbing or cyanosis BREAST: The lateral breasts reveal no masses nipple discharge. Well-healed surgical scar in the right breast NEURO: Alert oriented x3. DTR +4 motor and sensory is intact strength is symmetrical in upper and lower extremity  ECOG: 1   Lab Results: Lab Results  Component Value Date   WBC 8.5 01/19/2012   HGB 14.2 01/19/2012   HCT 42.8 01/19/2012   MCV 81.1 01/19/2012   PLT 256 01/19/2012     Chemistry      Component Value Date/Time   NA 138 12/22/2011 2230   K 3.4* 12/22/2011 2230   CL 98 12/22/2011 2230   CO2 29 12/22/2011 2230   BUN 14 12/22/2011 2230   CREATININE 0.72 12/22/2011 2230      Component Value Date/Time   CALCIUM 9.7 12/22/2011 2230   ALKPHOS 74 12/22/2011 2230   AST 16 12/22/2011 2230   ALT 12 12/22/2011 2230   BILITOT 0.7 12/22/2011 2230       Radiological Studies: NONE     IMPRESSIONS AND PLAN: A 71 y.o. 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 She will now begin Aromasin 25 mg daily.    #2 anemia patient will continue tandem  one daily for about 6 months time.  #3 patient will return to see me in 6 months time or sooner if need arises   Spent more than half the time coordinating care.    Drue Second, MD Medical/Oncology [facility] [phone] (beeper) [phone] (Office)  01/19/2012, 4:29 PM 2/7/20134:29 PM

## 2012-01-19 NOTE — Telephone Encounter (Signed)
gve the pt her aug 2013 appt calendar 

## 2012-01-20 ENCOUNTER — Ambulatory Visit: Payer: Medicare Other | Admitting: Oncology

## 2012-01-20 ENCOUNTER — Other Ambulatory Visit: Payer: Medicare Other | Admitting: Lab

## 2012-01-26 ENCOUNTER — Ambulatory Visit: Payer: Medicare Other | Admitting: Internal Medicine

## 2012-02-02 ENCOUNTER — Encounter (INDEPENDENT_AMBULATORY_CARE_PROVIDER_SITE_OTHER): Payer: Self-pay | Admitting: General Surgery

## 2012-02-02 ENCOUNTER — Ambulatory Visit (INDEPENDENT_AMBULATORY_CARE_PROVIDER_SITE_OTHER): Payer: Medicare Other | Admitting: General Surgery

## 2012-02-02 VITALS — BP 130/72 | HR 88 | Temp 98.4°F | Ht 60.0 in | Wt 281.4 lb

## 2012-02-02 DIAGNOSIS — C50919 Malignant neoplasm of unspecified site of unspecified female breast: Secondary | ICD-10-CM

## 2012-02-02 NOTE — Patient Instructions (Signed)
Your physical exam today is normal. There is no evidence of cancer in either breast or in your lymph nodes. Your mammograms are reportedly normal as well.  Keep your regular appointment with Dr. Drue Second. Continue taking the aromasin  hormone pill.  Return to see me in one year after you get her annual mammograms.

## 2012-02-02 NOTE — Progress Notes (Signed)
Subjective:     Patient ID: [name], female   DOB: Jul 04, 1941, 71 y.o.   MRN: [id number]  HPI This 71 year old woman is now one year out from her right breast conservation surgery for breast cancer.  She underwent right partial mastectomy, reexcision margins, and sentinel node biopsy on November 09, 2011. Her final pathology showed a pathologic stage TI B., N0, receptor positive, HER-2 negative, Ki-67 8% tumor.  She went on to have radiation therapy and is now on Aromasin and being followed by Dr. Garey Ham.  She apparently had mammograms last month at [facility]. I do not have those reports yet. She had a biopsy of the left breast which showed benign breast tissue and fibroadenomatoid nodules but no malignancy. She was told that her mammograms otherwise looked normal. We have requested these reports.  She has no complaints about her breast. She is also informed her that she is going to have her other hip replaced and that has helped her pain a great deal. She remains on Coumadin. She is followed by Dr. Adolm Joseph and Dr. Holley Bouche for her atrial fibrillation. She is followed by clinic note for her sarcoidosis.  Past Medical History  Diagnosis Date  . Diabetes mellitus   . Asthma   . Bronchitis   . Hernia   . Anemia   . Cancer   . Arthritis   . Hernia   . Morbid obesity   . Sarcoid   . OSA (obstructive sleep apnea)   . Atrial fibrillation   . Breast cancer, stage 1 10/17/2011  . Cough   . Wheezing   . Chills   . Atrial fibrillation   . Constipation   . Bruises easily    Current Outpatient Prescriptions  Medication Sig Dispense Refill  . acetaminophen (TYLENOL) 500 MG tablet Take 1,000 mg by mouth every 6 (six) hours as needed. pain      . albuterol (PROVENTIL HFA) 108 (90 BASE) MCG/ACT inhaler Inhale 2 puffs into the lungs every 4 (four) hours as needed for wheezing or shortness of breath.  1 Inhaler  prn  . albuterol (PROVENTIL) (2.5 MG/3ML) 0.083% nebulizer solution  Take 2.5 mg by nebulization 4 (four) times daily as needed. Shortness of breath/ wheezing      . Ascorbic Acid (VITAMIN C PO) Take by mouth.        . Calcium Carbonate-Vitamin D (CALCIUM 600 + D PO) Take by mouth 2 (two) times daily.       Marland Kitchen diltiazem (TIAZAC) 360 MG 24 hr capsule Take 1 tablet by mouth Daily.      Marland Kitchen exemestane (AROMASIN) 25 MG tablet Take 25 mg by mouth daily after breakfast.      . ferrous gluconate (FERGON) 246 (28 FE) MG tablet Take 1 tablet (246 mg total) by mouth daily with breakfast.  30 tablet  6  . Fluticasone-Salmeterol (ADVAIR DISKUS) 250-50 MCG/DOSE AEPB Inhale 1 puff into the lungs every 12 (twelve) hours.       . metformin (FORTAMET) 500 MG (OSM) 24 hr tablet Take 500 mg by mouth 2 (two) times daily as needed. If blood sugar is high then she takes 1 tablet      . metoprolol tartrate (LOPRESSOR) 25 MG tablet Take 25 mg by mouth Twice daily.       . Multiple Vitamin (MULTIVITAMIN PO) Take by mouth.        . potassium chloride (KLOR-CON) 20 MEQ packet Take 20 mEq by mouth 2 (two)  times daily.        . simvastatin (ZOCOR) 10 MG tablet Take 10 mg by mouth at bedtime.       . theophylline (THEO-24) 200 MG 24 hr capsule Take 1 capsule (200 mg total) by mouth 2 (two) times daily.  60 capsule  8  . warfarin (COUMADIN) 4 MG tablet Take 4-5 mg by mouth daily. Takes 1 tablet daily on sundays, tuesdays, thursdays, and saturdays for 4mg  dosage, then takes 1 and 1/2 tablet on mondays, wednesdays, and fridays for 5mg  dosage      . exemestane (AROMASIN) 25 MG tablet Take 1 tablet (25 mg total) by mouth daily after breakfast.  30 tablet  12   Allergies  Allergen Reactions  . Pravachol Other (See Comments)    Muscle pain  . Pravastatin Sodium     Review of Systems 12 system review of systems is performed and is negative except as described above.    Objective:   Physical Exam  Constitutional: She appears well-developed and well-nourished. No distress.       Ambulates with  walker.  Eyes: Conjunctivae are normal. Pupils are equal, round, and reactive to light. Right eye exhibits no discharge. Left eye exhibits no discharge. Scleral icterus is present.  Neck: Normal range of motion. Neck supple. No JVD present. No tracheal deviation present. No thyromegaly present.  Cardiovascular: Normal rate, normal heart sounds and intact distal pulses.  Exam reveals no gallop.   No murmur heard. Pulmonary/Chest: Effort normal and breath sounds normal. No respiratory distress. She has no wheezes. She has no rales. She exhibits no tenderness.    Lymphadenopathy:    She has no cervical adenopathy.  Skin: She is not diaphoretic.  Psychiatric: She has a normal mood and affect. Her behavior is normal. Judgment and thought content normal.       Assessment:     Invasive mammary carcinoma right breast, upper outer quadrant pathologic stage TI B., N0, receptor positive, HER-2-negative, Ki-67 8%.  One year following right partial mastectomy, reexcision margins, cystoscopy and biopsy. No evidence of local recurrence.  Multiple medical problems    Plan:     She sees Dr. Drue Second every 4 months.  She is followed closely by Dr. Holley Bouche and Dr. Rennis Golden for medical and cardiac issues.  Repeat bilateral mammogram in one year and see me at that time.   Angelia Mould. [name], M.D., [facility] General and Minimally invasive Surgery Breast and Colorectal Surgery Office:   [phone] Pager:   [phone]

## 2012-02-13 ENCOUNTER — Other Ambulatory Visit: Payer: Self-pay | Admitting: *Deleted

## 2012-02-13 DIAGNOSIS — C50119 Malignant neoplasm of central portion of unspecified female breast: Secondary | ICD-10-CM

## 2012-02-13 MED ORDER — EXEMESTANE 25 MG PO TABS: 25.0000 mg | ORAL_TABLET | Freq: Every day | ORAL | Status: DC

## 2012-02-22 ENCOUNTER — Encounter (INDEPENDENT_AMBULATORY_CARE_PROVIDER_SITE_OTHER): Payer: Self-pay

## 2012-04-04 ENCOUNTER — Other Ambulatory Visit: Payer: Self-pay | Admitting: Gastroenterology

## 2012-04-12 ENCOUNTER — Ambulatory Visit: Payer: Medicare Other | Admitting: Internal Medicine

## 2012-04-30 ENCOUNTER — Other Ambulatory Visit: Payer: Self-pay | Admitting: Diagnostic Neuroimaging

## 2012-04-30 DIAGNOSIS — R42 Dizziness and giddiness: Secondary | ICD-10-CM

## 2012-05-04 ENCOUNTER — Encounter: Payer: Self-pay | Admitting: Internal Medicine

## 2012-05-04 ENCOUNTER — Ambulatory Visit (INDEPENDENT_AMBULATORY_CARE_PROVIDER_SITE_OTHER): Payer: Medicare Other | Admitting: Internal Medicine

## 2012-05-04 VITALS — BP 110/52 | HR 77 | Ht 59.0 in | Wt 293.0 lb

## 2012-05-04 DIAGNOSIS — G4730 Sleep apnea, unspecified: Secondary | ICD-10-CM

## 2012-05-04 DIAGNOSIS — D869 Sarcoidosis, unspecified: Secondary | ICD-10-CM

## 2012-05-04 DIAGNOSIS — G4733 Obstructive sleep apnea (adult) (pediatric): Secondary | ICD-10-CM

## 2012-05-04 DIAGNOSIS — J9600 Acute respiratory failure, unspecified whether with hypoxia or hypercapnia: Secondary | ICD-10-CM

## 2012-05-04 DIAGNOSIS — J9691 Respiratory failure, unspecified with hypoxia: Secondary | ICD-10-CM

## 2012-05-04 DIAGNOSIS — E662 Morbid (severe) obesity with alveolar hypoventilation: Secondary | ICD-10-CM

## 2012-05-04 NOTE — Progress Notes (Signed)
Patient ID: [name], female    DOB: December 27, 1940, 71 y.o.   MRN: [id number]  HPI  06/13/11- 6 yoF never smoker, hx of OSA on CPAP Hx Sarcoid,, PAF-chronic coumadin , Morbid obesity   Last here December 21, 2010 - Note reviewed Blames heat for lack energy. She also had right breast lumpectomy and XRT in February. Now pending right knee replacement next week.  Got good report at cardiology last week, following for her AFIb. She had stopped ritalin as ineffective for complaints of tiredness before.  Continues CPAPat 9 cwp.   08/05/2011 Follow up  Pt presents today for follow up of O2. PT recently had R. TKR on 06/20/11 w/ rehab stay. She did have some desaturations post op and was started on o2 at 2l /m . Per pt she was discharged to rehab on o2. She had swelling in right lower leg and had venous doppler that was neg for DVT. She is on chronic coumadin for hx of a fib. She says she does wear out easily and has DOE. Does fine at rest w/ no dyspnea.  Wants to see if she can get off O2. Today in office O2 sat at rest was 94%. Walking O2 sat is 92% w/out desaturations.  Denies chest pain or hemoptysis. Weight is down 6 lbs since last ov.  No increased cough or congestion  Wearing CPAP each night  She was discharge home on 08/02/11 . No records from rehab available at todays visit. Will attempt to obtain.   09/22/11-69 yoF never smoker, hx of OSA on CPAP Hx Sarcoid,, PAF-chronic coumadin , Morbid obesity She is staying on her oxygen most of the time. Consider comfortably on room air for a while at rest is is it for sleep and exertion. She is having difficulty getting around with a walker and managing her oxygen tank. Her home care company says it does not have a small portable type that would work for her. She continues her CPAP every night/[facility]. Says she has lost 22 pounds since her knee replacement surgery.  1//3/12- - 69 yoF never smoker, hx of OSA on CPAP Hx Sarcoid,, PAF-chronic coumadin ,  Morbid obesity Still having SOB, slight wheezing at times. cough-productive-clear in color; denies any fever and chills She got portable oxygen/[facility]. Comfortable with her CPAP and using it every night 3 at Has felt sick since Thanksgiving. Now she just doesn't feel she can clear her airways of mucus. She took 2 rounds of antibiotics. Using her nebulizer machine.  05/04/12- 69 yoF never smoker, hx of OSA on CPAP Hx Sarcoid,, PAF-chronic coumadin , Morbid obesity Breathing is fine as along as using O2 as needed Uses oxygen at 2 L for sleep and when needed. She may need to be requalified. Occasional minor wheeze in the mornings does not bother her enough to use her rescue inhaler.  Review of Systems-see HPI Constitutional:   No-weight loss, night sweats, fevers, chills, fatigue, lassitude. HEENT:   No-  headaches, difficulty swallowing, tooth/dental problems, sore throat,       No-  sneezing, itching, ear ache, nasal congestion, post nasal drip,  CV:  No-   chest pain, orthopnea, PND, swelling in lower extremities, anasarca,  dizziness, palpitations Resp: + shortness of breath with exertion, not at rest.              No-   productive cough,  No non-productive cough,  No-  coughing up of blood.  No-   change in color of mucus.  No- wheezing.   Skin: No-   rash or lesions. GI:  No-   heartburn, indigestion, abdominal pain, nausea, vomiting, GU:  MS:  No-   joint pain or swelling.   Neuro- g nothing unusual Psych:  No- change in mood or affect. No depression or anxiety.  No memory loss.   Objective:   Physical Exam General- Alert, Oriented, Affect-appropriate, Distress- none acute, morbidly obese, using a walker and portable oxygen Skin- rash-none, lesions- none, excoriation- none Lymphadenopathy- none Head- atraumatic            Eyes- Gross vision intact, PERRLA, conjunctivae clear secretions            Ears- Hearing, canals-normal            Nose- Clear, no-Septal dev,  mucus, polyps, erosion, perforation             Throat- Mallampati II , mucosa clear , drainage- none, tonsils- atrophic Neck- flexible , trachea midline, no stridor , thyroid nl, carotid no bruit Chest - symmetrical excursion , unlabored           Heart/CV- r regular with occasional skipped , no murmur , no gallop  , no rub, nl s1 s2                           - JVD- none , 2-3+ edema bilaterally, stasis changes- none, varices- none           Lung-  trace wheeze- R>L, dry cough , dullness-none, rub- none           Chest wall-  Abd- t Br/ Gen/ Rectal- Not done, not indicated Extrem- cyanosis- none, clubbing, none, atrophy- none, strength- nl Neuro- grossly intact to observation

## 2012-05-04 NOTE — Patient Instructions (Addendum)
Order- PCC change Home O2 continuous and portable 2 L/M with light portable to [facility]. Dx Sarcoid, OSA  You can check with [facility] to verify that your CPAP is old enough for replacement. Let us know.

## 2012-05-10 NOTE — Assessment & Plan Note (Signed)
Morbid obesity with shallow inspiratory effort consistent with abdominal pressure on the diaphragm. Weight loss emphasized.

## 2012-05-10 NOTE — Assessment & Plan Note (Signed)
In clinical remission now.

## 2012-05-10 NOTE — Assessment & Plan Note (Addendum)
Continued good compliance and control with CPAP plus oxygen She will check with her DME company about eligibility for a new CPAP machine.

## 2012-05-10 NOTE — Assessment & Plan Note (Signed)
Is mostly a direct result of obesity with hypoventilation.

## 2012-05-11 ENCOUNTER — Ambulatory Visit
Admission: RE | Admit: 2012-05-11 | Discharge: 2012-05-11 | Disposition: A | Discharge status: Home / Self Care | Payer: Medicare Other | Source: Ambulatory Visit | Attending: Diagnostic Neuroimaging | Admitting: Diagnostic Neuroimaging

## 2012-05-11 DIAGNOSIS — R42 Dizziness and giddiness: Secondary | ICD-10-CM

## 2012-05-11 MED ORDER — GADOBENATE DIMEGLUMINE 529 MG/ML IV SOLN
20.0000 mL | Freq: Once | INTRAVENOUS | Status: AC | PRN
  Administered 2012-05-11: 20 mL via INTRAVENOUS

## 2012-05-31 ENCOUNTER — Telehealth: Payer: Self-pay | Admitting: Internal Medicine

## 2012-05-31 DIAGNOSIS — G4733 Obstructive sleep apnea (adult) (pediatric): Secondary | ICD-10-CM

## 2012-05-31 NOTE — Telephone Encounter (Signed)
Per CY-okay to order replacement CPAP with current pressure with humidifier and supplies DX OSA

## 2012-05-31 NOTE — Telephone Encounter (Signed)
Order has been sent. Pt aware order has been sent and nothing further was needed

## 2012-05-31 NOTE — Telephone Encounter (Signed)
I spoke with pt and she states she does qualify for a new cpap machine and she wants the "new small one that just came out". Please advise CDY thanks  [facility]

## 2012-06-01 ENCOUNTER — Other Ambulatory Visit: Payer: Self-pay | Admitting: Internal Medicine

## 2012-07-12 ENCOUNTER — Encounter: Payer: Self-pay | Admitting: Obstetrics and Gynecology

## 2012-07-26 ENCOUNTER — Ambulatory Visit: Payer: Medicare Other | Admitting: Oncology

## 2012-07-26 ENCOUNTER — Other Ambulatory Visit: Payer: Medicare Other | Admitting: Lab

## 2012-08-08 ENCOUNTER — Telehealth: Payer: Self-pay | Admitting: *Deleted

## 2012-08-08 ENCOUNTER — Encounter: Payer: Self-pay | Admitting: Oncology

## 2012-08-08 ENCOUNTER — Other Ambulatory Visit (HOSPITAL_BASED_OUTPATIENT_CLINIC_OR_DEPARTMENT_OTHER): Payer: Medicare Other

## 2012-08-08 ENCOUNTER — Ambulatory Visit (HOSPITAL_BASED_OUTPATIENT_CLINIC_OR_DEPARTMENT_OTHER): Payer: Medicare Other | Admitting: Oncology

## 2012-08-08 VITALS — BP 124/71 | HR 111 | Temp 97.9°F | Resp 20 | Ht 59.0 in | Wt 294.0 lb

## 2012-08-08 DIAGNOSIS — D649 Anemia, unspecified: Secondary | ICD-10-CM

## 2012-08-08 DIAGNOSIS — D509 Iron deficiency anemia, unspecified: Secondary | ICD-10-CM

## 2012-08-08 DIAGNOSIS — Z170 Estrogen receptor positive status [ER+]: Secondary | ICD-10-CM

## 2012-08-08 DIAGNOSIS — C50919 Malignant neoplasm of unspecified site of unspecified female breast: Secondary | ICD-10-CM

## 2012-08-08 DIAGNOSIS — E559 Vitamin D deficiency, unspecified: Secondary | ICD-10-CM

## 2012-08-08 DIAGNOSIS — C50119 Malignant neoplasm of central portion of unspecified female breast: Secondary | ICD-10-CM

## 2012-08-08 LAB — COMPREHENSIVE METABOLIC PANEL (CC13)
AST: 14 U/L (ref 5–34)
Alkaline Phosphatase: 84 U/L (ref 40–150)
BUN: 18 mg/dL (ref 7.0–26.0)
Calcium: 9.9 mg/dL (ref 8.4–10.4)
Creatinine: 1.1 mg/dL (ref 0.6–1.1)
Total Bilirubin: 0.9 mg/dL (ref 0.20–1.20)
Total Protein: 6.8 g/dL (ref 6.4–8.3)

## 2012-08-08 LAB — CBC WITH DIFFERENTIAL/PLATELET
BASO%: 0.3 % (ref 0.0–2.0)
Eosinophils Absolute: 0.3 10*3/uL (ref 0.0–0.5)
LYMPH%: 18.3 % (ref 14.0–49.7)
MCHC: 32.9 g/dL (ref 31.5–36.0)
MONO#: 0.6 10*3/uL (ref 0.1–0.9)
NEUT#: 4.9 10*3/uL (ref 1.5–6.5)
Platelets: 233 10*3/uL (ref 145–400)
RBC: 5.15 10*6/uL (ref 3.70–5.45)
WBC: 7.1 10*3/uL (ref 3.9–10.3)
lymph#: 1.3 10*3/uL (ref 0.9–3.3)

## 2012-08-08 LAB — FERRITIN: Ferritin: 34 ng/mL (ref 10–291)

## 2012-08-08 LAB — IRON AND TIBC
%SAT: 19 % — ABNORMAL LOW (ref 20–55)
Iron: 62 ug/dL (ref 42–145)
TIBC: 324 ug/dL (ref 250–470)
UIBC: 262 ug/dL (ref 125–400)

## 2012-08-08 MED ORDER — EXEMESTANE 25 MG PO TABS: 25.0000 mg | ORAL_TABLET | Freq: Every day | ORAL | Status: DC

## 2012-08-08 MED ORDER — VITAMIN D 1000 UNITS PO TABS: 1000.0000 [IU] | ORAL_TABLET | Freq: Every day | ORAL | Status: DC

## 2012-08-08 NOTE — Progress Notes (Signed)
Hematology and Oncology Follow Up Visit  [name] [id number] 08/13/1941 71 y.o. 08/08/2012 2:32 PM   DIAGNOSIS:   Encounter Diagnoses  Name Primary?  Marland Kitchen Anemia Yes  . Breast cancer, stage 1      PAST THERAPY:  #1 patient is a lumpectomy of the right breast in May 2012. 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 2 sentinel nodes were negative for metastatic disease.  #2 she then went on to receive radiation therapy to the breast from 03/28/2011 to 04/25/2011.  #3 she is now status post right knee replacement and is getting physical therapy.  #4.Anemia due to iron deficiency now resolved  Interim History:  Patient returns in followup overall he is doing well she is using a walker with the third day the only. She is tolerating the Aromasin quite nicely without problems and pains and hot flashes. She has no nausea vomiting fevers chills night sweats no myalgias for his arthritis. She denies any headaches shortness of breath chest pains or palpitations. No recent hospitalizations. She has no vaginal discharge or bleeding. Remainder of the 10 point review of systems is negative  Medications: Continuous: Current Outpatient Prescriptions on File Prior to Visit  Medication Sig Dispense Refill  . acetaminophen (TYLENOL) 500 MG tablet Take 1,000 mg by mouth every 6 (six) hours as needed. pain      . Ascorbic Acid (VITAMIN C PO) Take by mouth.        . Calcium Carbonate-Vitamin D (CALCIUM 600 + D PO) Take by mouth 2 (two) times daily.       Marland Kitchen diltiazem (TIAZAC) 360 MG 24 hr capsule Take 1 tablet by mouth Daily.      Marland Kitchen exemestane (AROMASIN) 25 MG tablet Take 1 tablet (25 mg total) by mouth daily after breakfast.  30 tablet  5  . ferrous gluconate (FERGON) 246 (28 FE) MG tablet Take 1 tablet (246 mg total) by mouth daily with breakfast.  30 tablet  6  . fexofenadine (ALLEGRA) 180 MG  tablet Take 180 mg by mouth as needed.      . Fluticasone-Salmeterol (ADVAIR DISKUS) 250-50 MCG/DOSE AEPB Inhale 1 puff into the lungs every 12 (twelve) hours.       . meclizine (ANTIVERT) 25 MG tablet Take 25 mg by mouth as needed.      . metformin (FORTAMET) 500 MG (OSM) 24 hr tablet Take 500 mg by mouth 2 (two) times daily as needed. If blood sugar is high then she takes 1 tablet      . metoprolol tartrate (LOPRESSOR) 25 MG tablet Take 25 mg by mouth Twice daily.       . Multiple Vitamin (MULTIVITAMIN PO) Take by mouth.        . potassium chloride (KLOR-CON) 20 MEQ packet Take 20 mEq by mouth 2 (two) times daily.        . simvastatin (ZOCOR) 10 MG tablet Take 10 mg by mouth at bedtime.       . theophylline (THEO-24) 200 MG 24 hr capsule Take 1 capsule (200 mg total) by mouth 2 (two) times daily.  60 capsule  8  . theophylline (THEODUR) 200 MG 12 hr tablet TAKE 1 TABLET BY MOUTH TWO TIMES A DAY.  60 tablet  8  . torsemide (DEMADEX) 20 MG tablet Take 20 mg by mouth 2 (two) times daily.      Marland Kitchen warfarin (COUMADIN) 4 MG tablet Take  4-5 mg by mouth daily. Takes 1 tablet daily on sundays, tuesdays, thursdays, and saturdays for 4mg  dosage, then takes 1 and 1/2 tablet on mondays, wednesdays, and fridays for 5mg  dosage      . albuterol (PROVENTIL HFA) 108 (90 BASE) MCG/ACT inhaler Inhale 2 puffs into the lungs every 4 (four) hours as needed for wheezing or shortness of breath.  1 Inhaler  prn  . albuterol (PROVENTIL) (2.5 MG/3ML) 0.083% nebulizer solution Take 2.5 mg by nebulization 4 (four) times daily as needed. Shortness of breath/ wheezing        Allergies:  Allergies  Allergen Reactions  . Pravachol Other (See Comments)    Muscle pain  . Pravastatin Sodium     Past Medical History, Surgical history, Social history, and Family History were reviewed and updated.  Review of Systems: 10 point review of systems is negative  Physical Exam:General appearance: alert, cooperative, appears stated  age and fatigued  Blood pressure 124/71, pulse 111, temperature 97.9 F (36.6 C), resp. rate 20, height 4\' 11"  (1.499 m), weight 294 lb (133.358 kg). ENT exam is unremarkable l LUNGS: are clear bilaterally to auscultation and percussion CVS: rate rhythm no murmurs gallops or rubs ABD: Soft nontender nondistended bowel sounds are present hepato/splenomegaly EXT: No edema clubbing or cyanosis BREAST: The lateral breasts reveal no masses nipple discharge. Well-healed surgical scar in the right breast NEURO: Alert oriented x3. DTR +4 motor and sensory is intact strength is symmetrical in upper and lower extremity  ECOG: 1   Lab Results: Lab Results  Component Value Date   WBC 7.1 08/08/2012   HGB 13.7 08/08/2012   HCT 41.8 08/08/2012   MCV 81.2 08/08/2012   PLT 233 08/08/2012     Chemistry      Component Value Date/Time   NA 138 01/19/2012 1351   K 4.0 01/19/2012 1351   CL 100 01/19/2012 1351   CO2 27 01/19/2012 1351   BUN 16 01/19/2012 1351   CREATININE 0.74 01/19/2012 1351      Component Value Date/Time   CALCIUM 9.5 01/19/2012 1351   ALKPHOS 91 01/19/2012 1351   AST 14 01/19/2012 1351   ALT 13 01/19/2012 1351   BILITOT 0.7 01/19/2012 1351       Radiological Studies: NONE  IMPRESSIONS AND PLAN: A 71 y.o. 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 She will now begin Aromasin 25 mg daily.    #2 anemia: Resolved  #3 patient and I discussed vitamin D deficiency I will check vitamin D level on her next visit and I have prescribed vitamin D 3 1000 units  daily. #4 patient will return to see me in 6 months time or sooner if need arises   Spent more than half the time coordinating care.    Drue Second, MD Medical/Oncology [facility] [phone] (beeper) [phone] (Office)  08/08/2012, 2:32 PM 8/28/20132:32 PM

## 2012-08-08 NOTE — Telephone Encounter (Signed)
Gave patient appointment for 03-08-2013 starting at 10:00am

## 2012-08-08 NOTE — Patient Instructions (Addendum)
1. Doing well continue aromasin daily  2. Take Vitamin D3 1000 iu daily  3. I will see you back in 6 months

## 2012-08-10 ENCOUNTER — Telehealth: Payer: Self-pay | Admitting: Medical Oncology

## 2012-08-10 NOTE — Telephone Encounter (Signed)
Per MD, patient to start taking 2 iron pills a day.  Instructed patient to be diligent about drinking water, eating leafy greens, and taking stool softeners as iron pills can cause constipation.  Patient expressed understanding and confirmed instructions.  Instructed patient to call with any questions or concerns.

## 2012-08-10 NOTE — Telephone Encounter (Signed)
Message copied by Tylene Fantasia on Fri Aug 10, 2012  9:45 AM ------      Message from: Victorino December      Created: Thu Aug 09, 2012  6:21 PM       Please call patient: take 2 iron pills daily

## 2012-11-02 ENCOUNTER — Ambulatory Visit (INDEPENDENT_AMBULATORY_CARE_PROVIDER_SITE_OTHER): Payer: Medicare Other | Admitting: Internal Medicine

## 2012-11-02 ENCOUNTER — Encounter: Payer: Self-pay | Admitting: Internal Medicine

## 2012-11-02 VITALS — BP 126/62 | HR 87 | Ht 59.0 in | Wt 274.2 lb

## 2012-11-02 DIAGNOSIS — D869 Sarcoidosis, unspecified: Secondary | ICD-10-CM

## 2012-11-02 DIAGNOSIS — G4733 Obstructive sleep apnea (adult) (pediatric): Secondary | ICD-10-CM

## 2012-11-02 DIAGNOSIS — E662 Morbid (severe) obesity with alveolar hypoventilation: Secondary | ICD-10-CM

## 2012-11-02 NOTE — Patient Instructions (Addendum)
Please call as needed- Have a Happy Thanksgiving !

## 2012-11-02 NOTE — Progress Notes (Signed)
Patient ID: [name], female    DOB: 1941-06-10, 71 y.o.   MRN: [id number]  HPI  06/13/11- 46 yoF never smoker, hx of OSA on CPAP Hx Sarcoid,, PAF-chronic coumadin , Morbid obesity   Last here December 21, 2010 - Note reviewed Blames heat for lack energy. She also had right breast lumpectomy and XRT in February. Now pending right knee replacement next week.  Got good report at cardiology last week, following for her AFIb. She had stopped ritalin as ineffective for complaints of tiredness before.  Continues CPAPat 9 cwp.   08/05/2011 Follow up  Pt presents today for follow up of O2. PT recently had R. TKR on 06/20/11 w/ rehab stay. She did have some desaturations post op and was started on o2 at 2l /m . Per pt she was discharged to rehab on o2. She had swelling in right lower leg and had venous doppler that was neg for DVT. She is on chronic coumadin for hx of a fib. She says she does wear out easily and has DOE. Does fine at rest w/ no dyspnea.  Wants to see if she can get off O2. Today in office O2 sat at rest was 94%. Walking O2 sat is 92% w/out desaturations.  Denies chest pain or hemoptysis. Weight is down 6 lbs since last ov.  No increased cough or congestion  Wearing CPAP each night  She was discharge home on 08/02/11 . No records from rehab available at todays visit. Will attempt to obtain.   09/22/11-69 yoF never smoker, hx of OSA on CPAP Hx Sarcoid,, PAF-chronic coumadin , Morbid obesity She is staying on her oxygen most of the time. Consider comfortably on room air for a while at rest is is it for sleep and exertion. She is having difficulty getting around with a walker and managing her oxygen tank. Her home care company says it does not have a small portable type that would work for her. She continues her CPAP every night/Advanced. Says she has lost 22 pounds since her knee replacement surgery.  1//3/12- - 69 yoF never smoker, hx of OSA on CPAP Hx Sarcoid,, PAF-chronic coumadin ,  Morbid obesity Still having SOB, slight wheezing at times. cough-productive-clear in color; denies any fever and chills She got portable oxygen/Advanced. Comfortable with her CPAP and using it every night 3 at Has felt sick since Thanksgiving. Now she just doesn't feel she can clear her airways of mucus. She took 2 rounds of antibiotics. Using her nebulizer machine.  05/04/12- 69 yoF never smoker, hx of OSA on CPAP Hx Sarcoid,, PAF-chronic coumadin , Morbid obesity Breathing is fine as along as using O2 as needed Uses oxygen at 2 L for sleep and when needed. She may need to be requalified. Occasional minor wheeze in the mornings does not bother her enough to use her rescue inhaler.  11/02/12- 71 yoF never smoker, hx of OSA on CPAP Hx Sarcoid,, PAF-chronic coumadin , Morbid obesity FOLLOWS FOR: SOB (mostly with activity) and wheezing; Still using O2 as needed Has had flu vaccine. She is pleased to report no issues at all with no recent colds or respiratory events. She is trying to lose some weight. Using oxygen 2L/ Advanced for sleep and if needed.  Review of Systems-see HPI Constitutional:   ?-weight loss, no-night sweats, fevers, chills, fatigue, lassitude. HEENT:   No-  headaches, difficulty swallowing, tooth/dental problems, sore throat,       No-  sneezing, itching, ear ache,  nasal congestion, post nasal drip,  CV:  No-   chest pain, orthopnea, PND, swelling in lower extremities, anasarca,  dizziness, palpitations Resp: + shortness of breath with exertion, not at rest.              No-   productive cough,  No non-productive cough,  No-  coughing up of blood.              No-   change in color of mucus.  No- wheezing.   Skin: No-   rash or lesions. GI:  No-   heartburn, indigestion, abdominal pain, nausea, vomiting, GU:  MS:  No-   joint pain or swelling.   Neuro-  nothing unusual Psych:  No- change in mood or affect. No depression or anxiety.  No memory loss.   Objective:   Physical  Exam General- Alert, Oriented, Affect-appropriate, Distress- none acute, morbidly obese, using a walker and portable oxygen Skin- rash-none, lesions- none, excoriation- none Lymphadenopathy- none Head- atraumatic            Eyes- Gross vision intact, PERRLA, conjunctivae clear secretions            Ears- Hearing, canals-normal            Nose- Clear, no-Septal dev, mucus, polyps, erosion, perforation             Throat- Mallampati II , mucosa clear , drainage- none, tonsils- atrophic Neck- flexible , trachea midline, no stridor , thyroid nl, carotid no bruit Chest - symmetrical excursion , unlabored           Heart/CV- nearly regular with occasional skipped , no murmur , no gallop  , no rub, nl s1 s2                           - JVD- none , 2-3+ edema bilaterally, stasis changes- none, varices- none           Lung-  clear,  no- cough , dullness-none, rub- none           Chest wall-  Abd-  Br/ Gen/ Rectal- Not done, not indicated Extrem- cyanosis- none, clubbing, none, atrophy- none, strength- nl Neuro- grossly intact to observation

## 2012-11-12 ENCOUNTER — Ambulatory Visit: Payer: Self-pay | Admitting: Obstetrics and Gynecology

## 2012-11-16 ENCOUNTER — Inpatient Hospital Stay (HOSPITAL_COMMUNITY)
Admission: EM | Admit: 2012-11-16 | Discharge: 2012-11-22 | DRG: 553 | Disposition: A | Discharge status: Skilled Nursing Facility | Payer: Medicare Other | Attending: Internal Medicine | Admitting: Internal Medicine

## 2012-11-16 ENCOUNTER — Emergency Department (HOSPITAL_COMMUNITY): Payer: Medicare Other

## 2012-11-16 ENCOUNTER — Encounter (HOSPITAL_COMMUNITY): Payer: Self-pay | Admitting: *Deleted

## 2012-11-16 ENCOUNTER — Observation Stay (HOSPITAL_COMMUNITY): Payer: Medicare Other

## 2012-11-16 DIAGNOSIS — Z79899 Other long term (current) drug therapy: Secondary | ICD-10-CM

## 2012-11-16 DIAGNOSIS — D649 Anemia, unspecified: Secondary | ICD-10-CM

## 2012-11-16 DIAGNOSIS — J9600 Acute respiratory failure, unspecified whether with hypoxia or hypercapnia: Secondary | ICD-10-CM

## 2012-11-16 DIAGNOSIS — C50311 Malignant neoplasm of lower-inner quadrant of right female breast: Secondary | ICD-10-CM | POA: Diagnosis present

## 2012-11-16 DIAGNOSIS — R609 Edema, unspecified: Secondary | ICD-10-CM

## 2012-11-16 DIAGNOSIS — E871 Hypo-osmolality and hyponatremia: Secondary | ICD-10-CM | POA: Diagnosis present

## 2012-11-16 DIAGNOSIS — J441 Chronic obstructive pulmonary disease with (acute) exacerbation: Secondary | ICD-10-CM | POA: Diagnosis present

## 2012-11-16 DIAGNOSIS — G4733 Obstructive sleep apnea (adult) (pediatric): Secondary | ICD-10-CM | POA: Diagnosis present

## 2012-11-16 DIAGNOSIS — C50919 Malignant neoplasm of unspecified site of unspecified female breast: Secondary | ICD-10-CM

## 2012-11-16 DIAGNOSIS — Z6841 Body Mass Index (BMI) 40.0 and over, adult: Secondary | ICD-10-CM

## 2012-11-16 DIAGNOSIS — J45901 Unspecified asthma with (acute) exacerbation: Secondary | ICD-10-CM | POA: Diagnosis present

## 2012-11-16 DIAGNOSIS — D869 Sarcoidosis, unspecified: Secondary | ICD-10-CM | POA: Diagnosis present

## 2012-11-16 DIAGNOSIS — I4891 Unspecified atrial fibrillation: Secondary | ICD-10-CM

## 2012-11-16 DIAGNOSIS — E119 Type 2 diabetes mellitus without complications: Secondary | ICD-10-CM | POA: Diagnosis present

## 2012-11-16 DIAGNOSIS — C50119 Malignant neoplasm of central portion of unspecified female breast: Secondary | ICD-10-CM

## 2012-11-16 DIAGNOSIS — M109 Gout, unspecified: Principal | ICD-10-CM

## 2012-11-16 DIAGNOSIS — E662 Morbid (severe) obesity with alveolar hypoventilation: Secondary | ICD-10-CM

## 2012-11-16 DIAGNOSIS — Z7901 Long term (current) use of anticoagulants: Secondary | ICD-10-CM

## 2012-11-16 DIAGNOSIS — Z794 Long term (current) use of insulin: Secondary | ICD-10-CM

## 2012-11-16 DIAGNOSIS — Z96659 Presence of unspecified artificial knee joint: Secondary | ICD-10-CM

## 2012-11-16 DIAGNOSIS — J40 Bronchitis, not specified as acute or chronic: Secondary | ICD-10-CM

## 2012-11-16 DIAGNOSIS — N179 Acute kidney failure, unspecified: Secondary | ICD-10-CM | POA: Diagnosis present

## 2012-11-16 DIAGNOSIS — J9691 Respiratory failure, unspecified with hypoxia: Secondary | ICD-10-CM | POA: Diagnosis present

## 2012-11-16 LAB — POCT I-STAT, CHEM 8
Calcium, Ion: 0.99 mmol/L — ABNORMAL LOW (ref 1.13–1.30)
Chloride: 96 mEq/L (ref 96–112)
Glucose, Bld: 227 mg/dL — ABNORMAL HIGH (ref 70–99)
HCT: 40 % (ref 36.0–46.0)
TCO2: 26 mmol/L (ref 0–100)

## 2012-11-16 LAB — CBC WITH DIFFERENTIAL/PLATELET
Basophils Absolute: 0 10*3/uL (ref 0.0–0.1)
Basophils Relative: 0 % (ref 0–1)
Eosinophils Relative: 2 % (ref 0–5)
HCT: 37.6 % (ref 36.0–46.0)
Hemoglobin: 13.4 g/dL (ref 12.0–15.0)
MCH: 26.7 pg (ref 26.0–34.0)
MCHC: 35.6 g/dL (ref 30.0–36.0)
MCV: 74.9 fL — ABNORMAL LOW (ref 78.0–100.0)
Monocytes Absolute: 0.8 10*3/uL (ref 0.1–1.0)
Monocytes Relative: 15 % — ABNORMAL HIGH (ref 3–12)
RDW: 16.1 % — ABNORMAL HIGH (ref 11.5–15.5)

## 2012-11-16 LAB — GLUCOSE, CAPILLARY: Glucose-Capillary: 219 mg/dL — ABNORMAL HIGH (ref 70–99)

## 2012-11-16 LAB — PROTIME-INR
INR: 6.3 (ref 0.00–1.49)
Prothrombin Time: 51.2 seconds — ABNORMAL HIGH (ref 11.6–15.2)

## 2012-11-16 MED ORDER — SIMVASTATIN 40 MG PO TABS
40.0000 mg | ORAL_TABLET | Freq: Every day | ORAL | Status: DC
  Administered 2012-11-16 – 2012-11-21 (×6): 40 mg via ORAL
  Filled 2012-11-16 (×7): qty 1

## 2012-11-16 MED ORDER — ONDANSETRON HCL 4 MG/2ML IJ SOLN
4.0000 mg | Freq: Three times a day (TID) | INTRAMUSCULAR | Status: AC | PRN
  Administered 2012-11-16: 4 mg via INTRAVENOUS
  Filled 2012-11-16: qty 2

## 2012-11-16 MED ORDER — IPRATROPIUM BROMIDE 0.02 % IN SOLN
0.5000 mg | RESPIRATORY_TRACT | Status: DC | PRN
  Administered 2012-11-19 – 2012-11-20 (×3): 0.5 mg via RESPIRATORY_TRACT
  Filled 2012-11-16 (×3): qty 2.5

## 2012-11-16 MED ORDER — DILTIAZEM HCL ER BEADS 240 MG PO CP24: 360.0000 mg | ORAL_CAPSULE | Freq: Every day | ORAL | Status: DC

## 2012-11-16 MED ORDER — INSULIN GLARGINE 100 UNIT/ML ~~LOC~~ SOLN
20.0000 [IU] | Freq: Every day | SUBCUTANEOUS | Status: DC
  Administered 2012-11-16 – 2012-11-17 (×2): 20 [IU] via SUBCUTANEOUS

## 2012-11-16 MED ORDER — EXEMESTANE 25 MG PO TABS
25.0000 mg | ORAL_TABLET | Freq: Every day | ORAL | Status: DC
  Administered 2012-11-17 – 2012-11-22 (×6): 25 mg via ORAL
  Filled 2012-11-16 (×7): qty 1

## 2012-11-16 MED ORDER — SODIUM CHLORIDE 0.9 % IV BOLUS (SEPSIS)
1000.0000 mL | Freq: Once | INTRAVENOUS | Status: AC
  Administered 2012-11-16: 1000 mL via INTRAVENOUS

## 2012-11-16 MED ORDER — PREDNISONE 20 MG PO TABS
40.0000 mg | ORAL_TABLET | Freq: Once | ORAL | Status: AC
  Administered 2012-11-16: 40 mg via ORAL
  Filled 2012-11-16: qty 2

## 2012-11-16 MED ORDER — WARFARIN SODIUM 4 MG PO TABS
4.0000 mg | ORAL_TABLET | Freq: Every day | ORAL | Status: DC
  Filled 2012-11-16: qty 1.5

## 2012-11-16 MED ORDER — TORSEMIDE 20 MG PO TABS
20.0000 mg | ORAL_TABLET | Freq: Two times a day (BID) | ORAL | Status: DC
  Administered 2012-11-16 – 2012-11-17 (×2): 20 mg via ORAL
  Filled 2012-11-16 (×3): qty 1

## 2012-11-16 MED ORDER — DILTIAZEM HCL ER COATED BEADS 360 MG PO CP24
360.0000 mg | ORAL_CAPSULE | Freq: Every day | ORAL | Status: DC
  Administered 2012-11-16 – 2012-11-22 (×7): 360 mg via ORAL
  Filled 2012-11-16 (×7): qty 1

## 2012-11-16 MED ORDER — ALBUTEROL SULFATE (5 MG/ML) 0.5% IN NEBU
2.5000 mg | INHALATION_SOLUTION | RESPIRATORY_TRACT | Status: DC | PRN
  Administered 2012-11-19 – 2012-11-20 (×3): 2.5 mg via RESPIRATORY_TRACT
  Filled 2012-11-16 (×3): qty 0.5

## 2012-11-16 MED ORDER — THEOPHYLLINE ER 200 MG PO TB12
200.0000 mg | ORAL_TABLET | Freq: Two times a day (BID) | ORAL | Status: DC
  Administered 2012-11-16 – 2012-11-22 (×12): 200 mg via ORAL
  Filled 2012-11-16 (×13): qty 1

## 2012-11-16 MED ORDER — HYDROCODONE-ACETAMINOPHEN 5-325 MG PO TABS
1.0000 | ORAL_TABLET | Freq: Four times a day (QID) | ORAL | Status: DC | PRN
  Administered 2012-11-16 – 2012-11-22 (×7): 1 via ORAL
  Filled 2012-11-16 (×7): qty 1

## 2012-11-16 MED ORDER — SODIUM CHLORIDE 0.9 % IV SOLN
Freq: Once | INTRAVENOUS | Status: AC
  Administered 2012-11-16: 16:00:00 via INTRAVENOUS

## 2012-11-16 MED ORDER — OXYCODONE-ACETAMINOPHEN 5-325 MG PO TABS
1.0000 | ORAL_TABLET | Freq: Once | ORAL | Status: AC
  Administered 2012-11-16: 1 via ORAL
  Filled 2012-11-16: qty 1

## 2012-11-16 MED ORDER — MORPHINE SULFATE 4 MG/ML IJ SOLN
4.0000 mg | Freq: Once | INTRAMUSCULAR | Status: AC
  Administered 2012-11-16: 4 mg via INTRAVENOUS
  Filled 2012-11-16: qty 1

## 2012-11-16 MED ORDER — POTASSIUM CHLORIDE CRYS ER 20 MEQ PO TBCR
20.0000 meq | EXTENDED_RELEASE_TABLET | Freq: Two times a day (BID) | ORAL | Status: DC
  Administered 2012-11-16 – 2012-11-17 (×2): 20 meq via ORAL
  Filled 2012-11-16 (×3): qty 1

## 2012-11-16 MED ORDER — DILTIAZEM HCL 25 MG/5ML IV SOLN
10.0000 mg | Freq: Once | INTRAVENOUS | Status: AC
  Administered 2012-11-16: 10 mg via INTRAVENOUS
  Filled 2012-11-16: qty 5

## 2012-11-16 MED ORDER — METOPROLOL TARTRATE 25 MG PO TABS
25.0000 mg | ORAL_TABLET | Freq: Two times a day (BID) | ORAL | Status: DC
  Administered 2012-11-16 – 2012-11-22 (×12): 25 mg via ORAL
  Filled 2012-11-16 (×13): qty 1

## 2012-11-16 MED ORDER — PREDNISONE 20 MG PO TABS
40.0000 mg | ORAL_TABLET | Freq: Every day | ORAL | Status: DC
  Administered 2012-11-17 – 2012-11-19 (×3): 40 mg via ORAL
  Filled 2012-11-16 (×4): qty 2

## 2012-11-16 MED ORDER — HYDROMORPHONE HCL PF 1 MG/ML IJ SOLN
1.0000 mg | INTRAMUSCULAR | Status: AC | PRN
  Administered 2012-11-16 (×2): 1 mg via INTRAVENOUS
  Filled 2012-11-16 (×3): qty 1

## 2012-11-16 MED ORDER — LORAZEPAM 0.5 MG PO TABS
0.5000 mg | ORAL_TABLET | Freq: Every evening | ORAL | Status: DC | PRN
  Administered 2012-11-19 – 2012-11-21 (×3): 0.5 mg via ORAL
  Filled 2012-11-16 (×3): qty 1

## 2012-11-16 MED ORDER — SODIUM CHLORIDE 0.9 % IV SOLN
INTRAVENOUS | Status: DC
  Administered 2012-11-16: 21:00:00 via INTRAVENOUS
  Administered 2012-11-17 – 2012-11-19 (×2): 20 mL/h via INTRAVENOUS

## 2012-11-16 NOTE — Progress Notes (Signed)
PCP:   [name], MD   Chief Complaint:  Wrist pain  HPI: 71 year old female with a history of A. fib, posterior to sleep apnea, sarcoidosis, morbid obesity, gout, breast cancer who came to the hospital with worsening of the wrist pain. Patient usually uses walker at home and was unable to use because of the worsening of the wrist pain on both sides. Patient denies any chest pain does report to having some shortness of breath denies nausea vomiting or diarrhea. Denies dysuria urgency frequency of urination. Denies any fever.  Allergies:   Allergies  Allergen Reactions  . Pravachol Other (See Comments)    Muscle pain  . Pravastatin Sodium       Past Medical History  Diagnosis Date  . Diabetes mellitus   . Asthma   . Bronchitis   . Hernia   . Anemia   . Cancer   . Arthritis   . Hernia   . Morbid obesity   . Sarcoid   . OSA (obstructive sleep apnea)   . Atrial fibrillation   . Breast cancer, stage 1 10/17/2011  . Cough   . Wheezing   . Chills   . Atrial fibrillation   . Constipation   . Bruises easily     Past Surgical History  Procedure Date  . Total knee arthroplasty 2001  . Abdominal hysterectomy 2006  . Resection of uterine cancer   . Left lower leg fx--casted   . Breast surgery     Prior to Admission medications   Medication Sig Start Date End Date Taking? Authorizing Provider  acetaminophen (TYLENOL) 500 MG tablet Take 1,000 mg by mouth every 6 (six) hours as needed. pain   Yes Historical Provider, MD  albuterol (PROVENTIL HFA) 108 (90 BASE) MCG/ACT inhaler Inhale 2 puffs into the lungs every 4 (four) hours as needed for wheezing or shortness of breath. 06/13/11 12/02/12 Yes Clinton D Young, MD  albuterol (PROVENTIL) (2.5 MG/3ML) 0.083% nebulizer solution Take 2.5 mg by nebulization 4 (four) times daily as needed. Shortness of breath/ wheezing 06/17/11 12/02/12 Yes Waymon Budge, MD  allopurinol (ZYLOPRIM) 100 MG tablet Take 200 mg by mouth daily.   11/04/12  Yes Historical Provider, MD  Calcium Carbonate-Vitamin D (CALCIUM 600 + D PO) Take 1 tablet by mouth 2 (two) times daily.    Yes Historical Provider, MD  cholecalciferol (VITAMIN D) 1000 UNITS tablet Take 1 tablet (1,000 Units total) by mouth daily. 08/08/12 08/08/13 Yes Victorino December, MD  colchicine 0.6 MG tablet Take 0.6 mg by mouth 2 (two) times daily as needed. For gout flareups.   Yes Historical Provider, MD  diltiazem (TIAZAC) 360 MG 24 hr capsule Take 1 tablet by mouth daily.  06/06/11  Yes Historical Provider, MD  exemestane (AROMASIN) 25 MG tablet Take 1 tablet (25 mg total) by mouth daily after breakfast. 02/13/12  Yes Victorino December, MD  ferrous gluconate (FERGON) 246 (28 FE) MG tablet Take 1 tablet (246 mg total) by mouth daily with breakfast. 10/17/11 12/02/12 Yes Victorino December, MD  Fluticasone-Salmeterol (ADVAIR DISKUS) 250-50 MCG/DOSE AEPB Inhale 1 puff into the lungs 2 (two) times daily as needed. For shortness of breath.   Yes Historical Provider, MD  insulin glargine (LANTUS) 100 UNIT/ML injection Inject 20 Units into the skin at bedtime.   Yes Historical Provider, MD  LORazepam (ATIVAN) 0.5 MG tablet Take 0.5 mg by mouth at bedtime as needed. For sleep.   Yes Historical Provider, MD  meclizine (  ANTIVERT) 25 MG tablet Take 6.25-12.5 mg by mouth 3 (three) times daily as needed. For dizziness.   Yes Historical Provider, MD  metFORMIN (GLUCOPHAGE) 500 MG tablet Take 500 mg by mouth 2 (two) times daily with a meal.   Yes Historical Provider, MD  metoprolol tartrate (LOPRESSOR) 25 MG tablet Take 25 mg by mouth 2 (two) times daily.  05/13/11  Yes Historical Provider, MD  Multiple Vitamin (MULTIVITAMIN WITH MINERALS) TABS Take 1 tablet by mouth daily.   Yes Historical Provider, MD  potassium chloride SA (K-DUR,KLOR-CON) 20 MEQ tablet Take 20 mEq by mouth 2 (two) times daily.   Yes Historical Provider, MD  promethazine (PHENERGAN) 25 MG tablet Take 25 mg by mouth every 6 (six) hours as  needed. For nausea.   Yes Historical Provider, MD  simvastatin (ZOCOR) 40 MG tablet Take 40 mg by mouth at bedtime.   Yes Historical Provider, MD  theophylline (THEODUR) 200 MG 12 hr tablet Take 200 mg by mouth 2 (two) times daily.   Yes Historical Provider, MD  torsemide (DEMADEX) 20 MG tablet Take 20 mg by mouth 2 (two) times daily.   Yes Historical Provider, MD  warfarin (COUMADIN) 4 MG tablet Take 4-6 mg by mouth daily. Take 1 tab daily except for 1.5 tab on MWF.   Yes Historical Provider, MD  HYDROcodone-acetaminophen (NORCO/VICODIN) 5-325 MG per tablet Take 1 tablet by mouth every 6 (six) hours as needed. For pain.    Historical Provider, MD    Social History:  reports that she has never smoked. She has never used smokeless tobacco. She reports that she does not drink alcohol or use illicit drugs.  Family History  Problem Relation Age of Onset  . Diabetes Mother   . Cancer Father     colon  . Diabetes Brother   . Cancer Sister   . Diabetes Sister     Review of Systems:  HEENT: Denies headache, blurred vision, runny nose, sore throat,  Neck: Denies thyroid problems, positive history of sarcoidosis Chest : See history of present illness Heart : Denies Chest pain,  coronary arterey disease GI: Denies  nausea, vomiting, diarrhea, constipation GU: Denies dysuria, urgency, frequency of urination, hematuria Neuro: Denies stroke, seizures, syncope    Physical Exam: Blood pressure 125/57, pulse 81, temperature 99 F (37.2 C), temperature source Oral, resp. rate 20, SpO2 95.00%. Constitutional:   Patient is a well-developed and well-nourished female  in no acute distress and cooperative with exam. Head: Normocephalic and atraumatic Mouth: Mucus membranes moist Eyes: PERRL, EOMI, conjunctivae normal Neck: Supple, No Thyromegaly Cardiovascular: RRR, S1 normal, S2 normal Pulmonary/Chest: CTAB, no wheezes, rales, or rhonchi Abdominal: Soft. Non-tender, non-distended, bowel sounds  are normal, no masses, organomegaly, or guarding present.  Neurological: A&O x3, Strenght is normal and symmetric bilaterally, cranial nerve II-XII are grossly intact, no focal motor deficit, sensory intact to light touch bilaterally.  Extremities : Wrists examined both wrists are erythematous, warm to touch range of motion is limited due to pain   Labs on Admission:  Results for orders placed during the hospital encounter of 11/16/12 (from the past 48 hour(s))  CBC WITH DIFFERENTIAL     Status: Abnormal   Collection Time   11/16/12  3:45 PM      Component Value Range Comment   WBC 5.4  4.0 - 10.5 K/uL    RBC 5.02  3.87 - 5.11 MIL/uL    Hemoglobin 13.4  12.0 - 15.0 g/dL    HCT  37.6  36.0 - 46.0 %    MCV 74.9 (*) 78.0 - 100.0 fL    MCH 26.7  26.0 - 34.0 pg    MCHC 35.6  30.0 - 36.0 g/dL    RDW 16.1 (*) 09.6 - 15.5 %    Platelets 190  150 - 400 K/uL    Neutrophils Relative 71  43 - 77 %    Neutro Abs 3.8  1.7 - 7.7 K/uL    Lymphocytes Relative 12  12 - 46 %    Lymphs Abs 0.6 (*) 0.7 - 4.0 K/uL    Monocytes Relative 15 (*) 3 - 12 %    Monocytes Absolute 0.8  0.1 - 1.0 K/uL    Eosinophils Relative 2  0 - 5 %    Eosinophils Absolute 0.1  0.0 - 0.7 K/uL    Basophils Relative 0  0 - 1 %    Basophils Absolute 0.0  0.0 - 0.1 K/uL   POCT I-STAT, CHEM 8     Status: Abnormal   Collection Time   11/16/12  3:54 PM      Component Value Range Comment   Sodium 131 (*) 135 - 145 mEq/L    Potassium 3.3 (*) 3.5 - 5.1 mEq/L    Chloride 96  96 - 112 mEq/L    BUN 39 (*) 6 - 23 mg/dL    Creatinine, Ser 0.45 (*) 0.50 - 1.10 mg/dL    Glucose, Bld 409 (*) 70 - 99 mg/dL    Calcium, Ion 8.11 (*) 1.13 - 1.30 mmol/L    TCO2 26  0 - 100 mmol/L    Hemoglobin 13.6  12.0 - 15.0 g/dL    HCT 91.4  78.2 - 95.6 %     Radiological Exams on Admission: Dg Wrist Complete Left  11/16/2012  *RADIOLOGY REPORT*  Clinical Data: Bilateral wrist pain.  Question gout.  LEFT WRIST - COMPLETE 3+ VIEW  Comparison: None.   Findings: Mild diffuse osteopenia is present.  Soft tissue swelling is present about the wrist.  There are no erosions.  Joint spaces are preserved.  IMPRESSION: Osteopenia and diffuse soft tissue swelling.  This could represent an inflammatory arthritis although no erosions are evident.   Original Report Authenticated By: Marin Roberts, M.D.    Dg Wrist Complete Right  11/16/2012  *RADIOLOGY REPORT*  Clinical Data: Bilateral wrist pain.  Question gout.  RIGHT WRIST - COMPLETE 3+ VIEW  Comparison: Left wrist films of the same day.  Findings: Mild osteopenia is present.  There is some loss of joint space at the [facility] joints.  Joint spaces are otherwise preserved. Subchondral cystic changes are noted in the distal ulna and scaphoid.  A periarticular erosion is suggested at the distal first metacarpal.  IMPRESSION:  1.  This changes compatible with an inflammatory arthritis, suggesting rheumatoid arthritis or possibly early gout.   Original Report Authenticated By: Marin Roberts, M.D.     Assessment/Plan  Exacerbation of gout Patient will be started on prednisone 40 mg by mouth daily I will hold the allopurinol at this time We'll give Lortab when necessary for pain  Dyspnea Patient uses home oxygen when necessary at home Today she is complaining of more than usual shortness of breath Will obtain chest x-ray Continue torsemide at home dose  Atrial fibrillation Patient is currently on diltiazem She also is on anti-coagulation with Coumadin We'll have have pharmacy dose the Coumadin  History of breast cancer Continue Aromasin  Diabetes mellitus Continue  home dose of Lantus 20 units at bedtime Sliding-scale insulin Will hold the metformin at this time  Time Spent on Admission: 75 min  [facility] Pager: [phone] 11/16/2012, 5:49 PM

## 2012-11-16 NOTE — Progress Notes (Signed)
[facility] hospital[facility] progress note. Chief complaint. Tachycardia. History of present illness. Elderly female in hospital with acute exacerbation of gout. She has a known history of atrial fib. Staff indicates the patient pulse is 120-140 beats per minute and sustaining. I requested a 12-lead EKG and this has resulted the confirming atrial fib with rapid ventricular response. The patient denies any chest pain or dyspnea currently. Vital signs. Temperature 98.9, pulse 140, respiration 22, blood pressure 133/57. O2 sats 93%. General appearance. Obese female in no distress. He is alert and cooperative. Cardiac. Tachycardic and irregular. Lungs. Diminished midlung to base bilaterally. No distress or cough. Abdomen. Soft with positive bowel sounds and obese. No pain, guarding, rebound tenderness. Impression/plan. Problem #1. Atrial fib with rapid ventricular response. I have asked staff to administer Cardizem 360 mg and 25 mg of on Metroprolol both to currently. I will also give 10 mg bolus of IV Cardizem. Staff will follow a heart rate and blood pressure and update me when necessary.

## 2012-11-16 NOTE — Progress Notes (Signed)
CRITICAL VALUE ALERT  Critical value received:  INR  Date of notification:  11/16/2012  Time of notification:  0901 Critical value read back:yes  Nurse who received alert:  Julious Oka, RN  MD notified (1st page):  Claiborne Billings, NP  Time of first page:  0902  MD notified (2nd page):  Time of second page:  Responding MD:  Claiborne Billings, NP  Time MD responded:  [phone]

## 2012-11-16 NOTE — ED Provider Notes (Signed)
History     CSN: [id number]  Arrival date & time 11/16/12  1232   First MD Initiated Contact with Patient 11/16/12 1309      Chief Complaint  Patient presents with  . bil wrist pain, hx gout     (Consider location/radiation/quality/duration/timing/severity/associated sxs/prior treatment) HPI Comments: Patient reports two days of bilateral wrist pain that feels like her prior gout.  States she has a hx of gout in her ankle, not in her wrists.  Has been taking allopurinol and tylenol without relief.  States she is having difficulty walking with her walker secondary to pain.  Denies fevers, denies injury to her wrists.  Pt notes she is aching all over, but believes this is from a medication recently started.  Denies any other changes or concerns.    The history is provided by the patient.    Past Medical History  Diagnosis Date  . Diabetes mellitus   . Asthma   . Bronchitis   . Hernia   . Anemia   . Cancer   . Arthritis   . Hernia   . Morbid obesity   . Sarcoid   . OSA (obstructive sleep apnea)   . Atrial fibrillation   . Breast cancer, stage 1 10/17/2011  . Cough   . Wheezing   . Chills   . Atrial fibrillation   . Constipation   . Bruises easily     Past Surgical History  Procedure Date  . Total knee arthroplasty 2001  . Abdominal hysterectomy 2006  . Resection of uterine cancer   . Left lower leg fx--casted   . Breast surgery     Family History  Problem Relation Age of Onset  . Diabetes Mother   . Cancer Father     colon  . Diabetes Brother   . Cancer Sister   . Diabetes Sister     History  Substance Use Topics  . Smoking status: Never Smoker   . Smokeless tobacco: Not on file  . Alcohol Use: No    OB History    Grav Para Term Preterm Abortions TAB SAB Ect Mult Living                  Review of Systems  Constitutional: Negative for fever and chills.  Respiratory: Negative for cough and shortness of breath.   Cardiovascular: Negative for  chest pain.  Gastrointestinal: Positive for diarrhea. Negative for nausea, vomiting, abdominal pain and constipation.  Genitourinary: Negative for dysuria, urgency and frequency.  Skin: Positive for color change.  Neurological: Negative for weakness and numbness.    Allergies  Pravachol and Pravastatin sodium  Home Medications   Current Outpatient Rx  Name  Route  Sig  Dispense  Refill  . ALLOPURINOL 100 MG PO TABS   Oral   Take 200 mg by mouth daily.          Marland Kitchen VITAMIN D 1000 UNITS PO TABS   Oral   Take 1 tablet (1,000 Units total) by mouth daily.   30 tablet   12   . DILTIAZEM HCL ER BEADS 360 MG PO CP24   Oral   Take 1 tablet by mouth daily.          Marland Kitchen EXEMESTANE 25 MG PO TABS   Oral   Take 1 tablet (25 mg total) by mouth daily after breakfast.   30 tablet   5   . HYDROCODONE-ACETAMINOPHEN 5-325 MG PO TABS   Oral   Take  1 tablet by mouth every 6 (six) hours as needed. For pain.         . INSULIN GLARGINE 100 UNIT/ML [place] SOLN   Subcutaneous   Inject 20 Units into the skin at bedtime.         Marland Kitchen LORAZEPAM 0.5 MG PO TABS   Oral   Take 0.5 mg by mouth at bedtime as needed. For sleep.         Marland Kitchen MECLIZINE HCL 25 MG PO TABS   Oral   Take 6.25-12.5 mg by mouth 3 (three) times daily as needed. For dizziness.         . METFORMIN HCL 500 MG PO TABS   Oral   Take 500 mg by mouth 2 (two) times daily with a meal.         . METOPROLOL TARTRATE 25 MG PO TABS   Oral   Take 25 mg by mouth Twice daily.          Marland Kitchen POTASSIUM CHLORIDE CRYS ER 20 MEQ PO TBCR   Oral   Take 20 mEq by mouth 2 (two) times daily.         Marland Kitchen PROMETHAZINE HCL 25 MG PO TABS   Oral   Take 25 mg by mouth every 6 (six) hours as needed. For nausea.         Marland Kitchen SIMVASTATIN 40 MG PO TABS   Oral   Take 40 mg by mouth at bedtime.         . THEOPHYLLINE ER 200 MG PO TB12      TAKE 1 TABLET BY MOUTH TWO TIMES A DAY.   60 tablet   8   . TORSEMIDE 20 MG PO TABS   Oral   Take 20  mg by mouth 2 (two) times daily.         . WARFARIN SODIUM 4 MG PO TABS   Oral   Take 4-6 mg by mouth daily. Take 1 tab daily except for 1.5 tab on MWF.         Marland Kitchen ACETAMINOPHEN 500 MG PO TABS   Oral   Take 1,000 mg by mouth every 6 (six) hours as needed. pain         . ALBUTEROL SULFATE HFA 108 (90 BASE) MCG/ACT IN AERS   Inhalation   Inhale 2 puffs into the lungs every 4 (four) hours as needed for wheezing or shortness of breath.   1 Inhaler   prn   . ALBUTEROL SULFATE (2.5 MG/3ML) 0.083% IN NEBU   Nebulization   Take 2.5 mg by nebulization 4 (four) times daily as needed. Shortness of breath/ wheezing         . CALCIUM 600 + D PO   Oral   Take by mouth 2 (two) times daily.          Marland Kitchen FERROUS GLUCONATE IRON 246 (28 FE) MG PO TABS   Oral   Take 1 tablet (246 mg total) by mouth daily with breakfast.   30 tablet   6   . FLUTICASONE-SALMETEROL 250-50 MCG/DOSE IN AEPB   Inhalation   Inhale 1 puff into the lungs every 12 (twelve) hours.          . MULTIVITAMIN PO   Oral   Take by mouth.           . ONDANSETRON HCL 4 MG PO TABS   Oral   Take 4 mg by mouth every 8 (eight) hours as  needed.           BP 138/67  Temp 98.3 F (36.8 C) (Oral)  Resp 20  SpO2 95%  Physical Exam  Nursing note and vitals reviewed. Constitutional: She appears well-developed and well-nourished. No distress.  HENT:  Head: Normocephalic and atraumatic.  Eyes: Conjunctivae normal are normal.  Neck: Neck supple.  Pulmonary/Chest: Effort normal.  Musculoskeletal: She exhibits tenderness.       Bilateral wrists erythematous and warm, tender to light palpation.  Decreased AROM secondary to pain.  All digits of hands nontender, no edema, full AROM, capillary refill < 2 seconds.    Neurological: She is alert. She exhibits normal muscle tone.  Skin: She is not diaphoretic.  Psychiatric: She has a normal mood and affect. Her behavior is normal.    ED Course  Procedures (including  critical care time)  Labs Reviewed - No data to display Dg Wrist Complete Left  11/16/2012  *RADIOLOGY REPORT*  Clinical Data: Bilateral wrist pain.  Question gout.  LEFT WRIST - COMPLETE 3+ VIEW  Comparison: None.  Findings: Mild diffuse osteopenia is present.  Soft tissue swelling is present about the wrist.  There are no erosions.  Joint spaces are preserved.  IMPRESSION: Osteopenia and diffuse soft tissue swelling.  This could represent an inflammatory arthritis although no erosions are evident.   Original Report Authenticated By: Marin Roberts, M.D.    Dg Wrist Complete Right  11/16/2012  *RADIOLOGY REPORT*  Clinical Data: Bilateral wrist pain.  Question gout.  RIGHT WRIST - COMPLETE 3+ VIEW  Comparison: Left wrist films of the same day.  Findings: Mild osteopenia is present.  There is some loss of joint space at the [facility] joints.  Joint spaces are otherwise preserved. Subchondral cystic changes are noted in the distal ulna and scaphoid.  A periarticular erosion is suggested at the distal first metacarpal.  IMPRESSION:  1.  This changes compatible with an inflammatory arthritis, suggesting rheumatoid arthritis or possibly early gout.   Original Report Authenticated By: Marin Roberts, M.D.    3:32 PM Pt reports pain was initially 13/10, now 8/10.  States she cannot go home because she lives alone, is unable to use her walker or feed herself.    5:05 PM Pt admitted to [facility].    1. Gout attack   2. Malignant neoplasm of central portion of female breast   3. Anemia   4. Atrial fibrillation   5. Breast cancer, stage 1   6. Respiratory failure with hypoxia      MDM  Pt who lives alone at home p/w gout attack in bilateral wrists.  She is unable to use walker or feed herself at home.  Pt admitted for pain control /observation admission given patient's inability to take care of self at home and uncontrolled pain.  Pt admitted to [facility].          Trixie Dredge,  [place] 11/16/12 1948

## 2012-11-16 NOTE — ED Notes (Signed)
Per ems pt alert and oriented x4, non ambulatory. Pt reports hx of gout, flare up has been going on for 2 days, pt was taking allpurinol with no relief. Reports bil wrist pain 10/10, last night unable to grip walker handlebars, and so pt sat on couch for whole night and incontinent. Pt wears O2 prn. Pt denies nausea or chest pain. Pt reports SOB is normal baseline

## 2012-11-16 NOTE — ED Provider Notes (Signed)
Medical screening examination/treatment/procedure(s) were conducted as a shared visit with non-physician practitioner(s) and myself.  I personally evaluated the patient during the encounter  Patient with severe pain to her bilateral wrists and knees. She is unable to care for herself at home. This is from her gout which is long-standing history of. Patient to be seen by the hospitalist for evaluation for admission  Toy Baker, MD 11/16/12 1542

## 2012-11-16 NOTE — Progress Notes (Addendum)
ANTICOAGULATION CONSULT NOTE - Initial Consult  Pharmacy Consult for Coumadin Indication: atrial fibrillation  Allergies  Allergen Reactions  . Pravachol Other (See Comments)    Muscle pain  . Pravastatin Sodium     Patient Measurements:    Vital Signs: Temp: 99 F (37.2 C) (12/06 1658) Temp src: Oral (12/06 1658) BP: 128/63 mmHg (12/06 1800) Pulse Rate: 77  (12/06 1800)  Labs:  Basename 11/16/12 1554 11/16/12 1545  HGB 13.6 13.4  HCT 40.0 37.6  PLT -- 190  APTT -- --  LABPROT -- --  INR -- --  HEPARINUNFRC -- --  CREATININE 1.40* --  CKTOTAL -- --  CKMB -- --  TROPONINI -- --    The CrCl is unknown because both a height and weight (above a minimum accepted value) are required for this calculation.   Medical History: Past Medical History  Diagnosis Date  . Diabetes mellitus   . Asthma   . Bronchitis   . Hernia   . Anemia   . Cancer   . Arthritis   . Hernia   . Morbid obesity   . Sarcoid   . OSA (obstructive sleep apnea)   . Atrial fibrillation   . Breast cancer, stage 1 10/17/2011  . Cough   . Wheezing   . Chills   . Atrial fibrillation   . Constipation   . Bruises easily     Assessment:  [name] with h/o atrial fibrillation on chronic Coumadin prior to admission.  Patient was reportedly on Coumadin 6 mg on MWF and 4 mg other days.  Last dose 11/15/2012.  No PT/INR ordered.  CBC okay  Goal of Therapy:  INR 2-3 Monitor platelets by anticoagulation protocol: Yes   Plan:   STAT PT/INR  Pharmacy will f/u  Geoffry Paradise, PharmD, BCPS Pager: [phone] 7:31 PM Pharmacy #: [phone]     Addendum  STAT INR returns as 6.3. No bleeding documented.  Plan: No Coumadin tonight. Daily PT/INR. Pharmacy will f/u.  Geoffry Paradise, PharmD, BCPS Pager: [phone] 9:40 PM Pharmacy #: [phone]

## 2012-11-16 NOTE — ED Notes (Signed)
Bed:WHALA<BR> Expected date:11/16/12<BR> Expected time:<BR> Means of arrival:<BR> Comments:<BR> Bilateral wrist pain

## 2012-11-16 NOTE — Progress Notes (Signed)
  CARE MANAGEMENT ED NOTE 11/16/2012  Patient:  [name], [name]   Account Number:  [id number]  Date Initiated:  11/16/2012  Documentation initiated by:  Edd Arbour  Subjective/Objective Assessment:   71 yr old female medicare/[employer] [place] patient pcp [name] one admission in last 6 months c/o bilateral wrist pain, sob HR 143 BUN 39 creat 1.40 K 3.3     Subjective/Objective Assessment Detail:   imaging shows  This changes compatible with an inflammatory arthritis,  suggesting rheumatoid arthritis or possibly early gout Osteopenia and diffuse soft tissue swelling.  This could represent an inflammatory arthritis although no erosions are evident Admitted for observation for dx gout Marcial Pacas, Son at bedside who states pt lives alone with him & brother in same city He visits pt last & was there last pm Son reports pt is "groggy" Reports speaking with pt on 11/15/12 about snf for rehab     Action/Plan:   Cm spoke with son, timothy while pt in xray to provide lists of [place] home health agencies, private duty nursing, assisted living and snfs(discussed differences) Discussed wl therapy evaluation for level of care recommendations.   Action/Plan Detail:   Discussed how if pt d/c home she could be assisted to snf if needed with assist of pcp, home health RN & SW. Son Voiced understanding and appreciation for resources and information provided Will consult with older brother, Ethelene Browns and pt.   Anticipated DC Date:  11/17/2012     Status Recommendation to Physician:   Result of Recommendation:    Other ED Services  Consult Working Plan    DC Planning Services  Other  Outpatient Services - Pt will follow up    Choice offered to / List presented to:            Status of service:  Completed, signed off  ED Comments:

## 2012-11-17 DIAGNOSIS — E119 Type 2 diabetes mellitus without complications: Secondary | ICD-10-CM | POA: Diagnosis present

## 2012-11-17 DIAGNOSIS — M109 Gout, unspecified: Secondary | ICD-10-CM | POA: Diagnosis present

## 2012-11-17 DIAGNOSIS — N179 Acute kidney failure, unspecified: Secondary | ICD-10-CM | POA: Diagnosis present

## 2012-11-17 LAB — COMPREHENSIVE METABOLIC PANEL
AST: 9 U/L (ref 0–37)
Albumin: 2.6 g/dL — ABNORMAL LOW (ref 3.5–5.2)
Calcium: 8.7 mg/dL (ref 8.4–10.5)
Chloride: 92 mEq/L — ABNORMAL LOW (ref 96–112)
Creatinine, Ser: 1.6 mg/dL — ABNORMAL HIGH (ref 0.50–1.10)
Total Protein: 7.1 g/dL (ref 6.0–8.3)

## 2012-11-17 LAB — MAGNESIUM: Magnesium: 1.1 mg/dL — ABNORMAL LOW (ref 1.5–2.5)

## 2012-11-17 LAB — CBC
MCH: 26.3 pg (ref 26.0–34.0)
MCHC: 34.3 g/dL (ref 30.0–36.0)
MCV: 76.6 fL — ABNORMAL LOW (ref 78.0–100.0)
Platelets: 187 10*3/uL (ref 150–400)
RDW: 16.3 % — ABNORMAL HIGH (ref 11.5–15.5)
WBC: 6.7 10*3/uL (ref 4.0–10.5)

## 2012-11-17 LAB — GLUCOSE, CAPILLARY: Glucose-Capillary: 417 mg/dL — ABNORMAL HIGH (ref 70–99)

## 2012-11-17 MED ORDER — SODIUM CHLORIDE 0.9 % IV SOLN: 1.0000 g | Freq: Once | INTRAVENOUS | Status: DC

## 2012-11-17 MED ORDER — DOCUSATE SODIUM 100 MG PO CAPS
100.0000 mg | ORAL_CAPSULE | Freq: Two times a day (BID) | ORAL | Status: DC
  Administered 2012-11-18 – 2012-11-22 (×9): 100 mg via ORAL
  Filled 2012-11-17 (×11): qty 1

## 2012-11-17 MED ORDER — INSULIN ASPART 100 UNIT/ML ~~LOC~~ SOLN
0.0000 [IU] | Freq: Every day | SUBCUTANEOUS | Status: DC
  Administered 2012-11-17: 5 [IU] via SUBCUTANEOUS

## 2012-11-17 MED ORDER — VITAMIN K1 10 MG/ML IJ SOLN
10.0000 mg | Freq: Once | INTRAMUSCULAR | Status: DC
  Filled 2012-11-17: qty 1

## 2012-11-17 MED ORDER — VITAMIN K1 10 MG/ML IJ SOLN
5.0000 mg | Freq: Once | INTRAMUSCULAR | Status: AC
  Administered 2012-11-17: 5 mg via SUBCUTANEOUS
  Filled 2012-11-17: qty 0.5

## 2012-11-17 MED ORDER — INSULIN ASPART 100 UNIT/ML ~~LOC~~ SOLN
0.0000 [IU] | Freq: Three times a day (TID) | SUBCUTANEOUS | Status: DC
  Administered 2012-11-17: 15 [IU] via SUBCUTANEOUS
  Administered 2012-11-17: 11 [IU] via SUBCUTANEOUS
  Administered 2012-11-18: 15 [IU] via SUBCUTANEOUS

## 2012-11-17 NOTE — Progress Notes (Signed)
ANTICOAGULATION CONSULT NOTE - Follow Up Consult  Pharmacy Consult for Coumadin Indication: atrial fibrillation  Allergies  Allergen Reactions  . Pravachol Other (See Comments)    Muscle pain  . Pravastatin Sodium     Patient Measurements: Height: 5' (152.4 cm) Weight: 280 lb 3.3 oz (127.1 kg) IBW/kg (Calculated) : 45.5   Vital Signs: Temp: 98.4 F (36.9 C) (12/07 0637) Temp src: Oral (12/07 0637) BP: 120/72 mmHg (12/07 0637) Pulse Rate: 82  (12/07 0637)  Labs:  Basename 11/17/12 0540 11/16/12 1930 11/16/12 1554 11/16/12 1545  HGB 13.1 -- 13.6 --  HCT 38.2 -- 40.0 37.6  PLT 187 -- -- 190  APTT -- -- -- --  LABPROT 61.4* 51.2* -- --  INR 8.04* 6.30* -- --  HEPARINUNFRC -- -- -- --  CREATININE 1.60* -- 1.40* --  CKTOTAL -- -- -- --  CKMB -- -- -- --  TROPONINI -- -- -- --    Estimated Creatinine Clearance: 38.6 ml/min (by C-G formula based on Cr of 1.6).   Medications:  Scheduled:    . [COMPLETED] sodium chloride   Intravenous Once  . diltiazem  360 mg Oral Daily  . [COMPLETED] diltiazem  10 mg Intravenous Once  . exemestane  25 mg Oral QPC breakfast  . insulin glargine  20 Units Subcutaneous QHS  . metoprolol tartrate  25 mg Oral BID  . [COMPLETED]  morphine injection  4 mg Intravenous Once  . [COMPLETED] oxyCODONE-acetaminophen  1 tablet Oral Once  . potassium chloride SA  20 mEq Oral BID  . [COMPLETED] predniSONE  40 mg Oral Once  . predniSONE  40 mg Oral Q breakfast  . simvastatin  40 mg Oral QHS  . [COMPLETED] sodium chloride  1,000 mL Intravenous Once  . theophylline  200 mg Oral BID  . torsemide  20 mg Oral BID  . [DISCONTINUED] diltiazem  360 mg Oral Daily  . [DISCONTINUED] warfarin  4-6 mg Oral Daily   Infusions:    . sodium chloride 10 mL/hr at 11/16/12 2123    Assessment:  66 yof with h/o atrial fibrillation on chronic Coumadin.  Admitted 12/6 for Afib with RVR.  Patient was reportedly on Coumadin 6 mg on MWF and 4 mg other  days.  INR supratherapeutic and rising (8.04), no bleeding reported (would not recommend vitamin K at this time).  Goal of Therapy:  INR 2-3   Plan:   No coumadin today  Daily PT/INR  Loralee Pacas, PharmD, BCPS Pager: [phone] 11/17/2012,8:40 AM

## 2012-11-17 NOTE — Assessment & Plan Note (Signed)
Good compliance and control. No changes are appropriate now.

## 2012-11-17 NOTE — Progress Notes (Signed)
Patient's INR was critical the second time with an INR of 8.04 at 0540. MD, Short was notified. The patient does not have any signs of active bleeding. MD Short is going to pass along information to her colleague for them to decide if they want to order a small dose of vitamin K. RN will continue to monitor for any signs of bleeding.

## 2012-11-17 NOTE — Progress Notes (Signed)
Pt states she urinated "a bunch" in the emergency department. Patient has had 0 output since midnight. Bladder scan volume was 39mL. Patient's bladder is not tender. NP, Benedetto Coons was paged.

## 2012-11-17 NOTE — Progress Notes (Signed)
Patient has received 360mg  cardizem PO, 25mg  metoprolol PO and 10mg  IV cardizem. Patient's HR is now in the low 100s and BP has been stable. RN will continue to monitor HR and blood pressure.

## 2012-11-17 NOTE — Progress Notes (Signed)
CRITICAL VALUE ALERT  Critical value received:  INR  Date of notification:  06/13/12  Time of notification:  0701  Critical value read back:yes  Nurse who received alert:  Earle Gell, Theola Sequin  MD notified (1st page):  MD Short  Time of first page:  [phone]  MD notified (2nd page):  Time of second page:  Responding MD:  MD Short  Time MD responded:  [phone]

## 2012-11-17 NOTE — Assessment & Plan Note (Signed)
She is not considering bariatric surgery. She may be getting a few pounds off by diet.

## 2012-11-17 NOTE — ED Provider Notes (Signed)
Medical screening examination/treatment/procedure(s) were performed by non-physician practitioner and as supervising physician I was immediately available for consultation/collaboration.  Kaeley Vinje T Johnwilliam Shepperson, MD 11/17/12 0717 

## 2012-11-17 NOTE — Progress Notes (Signed)
Patient ID: ICYSS SKOG  female  ZOX:[id number]    DOB: 1941/09/01    DOA: 11/16/2012  PCP: Sharon Blamer, MD  Assessment/Plan: Principal Problem:  *Gout attack: Due to acute renal insufficiency unable to give colchicine or NSAIDs for the pain - Continue prednisone, patient feels some relief from yesterday - Check serum uric acid, RA factor  Active Problems:  Afib with RVR: Right now controlled, INR supratherapeutic at 8.0 - Continue beta blocker and Cardizem, hold Coumadin - Will give one dose of vitamin K   Breast cancer, stage 1: cont aromasin   Diabetes mellitus: Blood sugars uncontrolled due to prednisone - Placed on a Lantus, sliding scale insulin   Acute renal failure: - Hold off on torsemide until creatinine function is improving - No IV fluids    DVT Prophylaxis:  Code Status:  Disposition: Hopefully tomorrow if improved  Subjective: Feels some relief from pain, wrists swollen, states feel "same as gout attack on her foot"  Objective: Weight change:   Intake/Output Summary (Last 24 hours) at 11/17/12 1050 Last data filed at 11/17/12 0700  Gross per 24 hour  Intake 526.17 ml  Output      0 ml  Net 526.17 ml   Blood pressure 120/72, pulse 82, temperature 98.4 F (36.9 C), temperature source Oral, resp. rate 20, height 5' (1.524 m), weight 127.1 kg (280 lb 3.3 oz), SpO2 95.00%.  Physical Exam: General: Alert and awake, oriented x3, not in any acute distress. HEENT: anicteric sclera, pupils reactive to light and accommodation, EOMI CVS: S1-S2 clear, no murmur rubs or gallops Chest: clear to auscultation bilaterally, no wheezing, rales or rhonchi Abdomen: soft nontender, nondistended, normal bowel sounds, no organomegaly Extremities: no cyanosis, clubbing or edema noted bilaterally. Both wrists are warm to touch and swollen (per pt improving) Neuro: Cranial nerves II-XII intact, no focal neurological deficits  Lab Results: Basic Metabolic Panel:  Lab  11/17/12 0540 11/16/12 1554  NA 131* 131*  K 4.1 3.3*  CL 92* 96  CO2 25 --  GLUCOSE 250* 227*  BUN 45* 39*  CREATININE 1.60* 1.40*  CALCIUM 8.7 --  MG -- --  PHOS -- --   Liver Function Tests:  Lab 11/17/12 0540  AST 9  ALT 7  ALKPHOS 75  BILITOT 0.8  PROT 7.1  ALBUMIN 2.6*   No results found for this basename: LIPASE:2,AMYLASE:2 in the last 168 hours No results found for this basename: AMMONIA:2 in the last 168 hours CBC:  Lab 11/17/12 0540 11/16/12 1554 11/16/12 1545  WBC 6.7 -- 5.4  NEUTROABS -- -- 3.8  HGB 13.1 13.6 --  HCT 38.2 40.0 --  MCV 76.6* -- 74.9*  PLT 187 -- 190   Cardiac Enzymes: No results found for this basename: CKTOTAL:3,CKMB:3,CKMBINDEX:3,TROPONINI:3 in the last 168 hours BNP: No components found with this basename: POCBNP:2 CBG:  Lab 11/16/12 2306 11/16/12 2256  GLUCAP 219* 204*     Micro Results: No results found for this or any previous visit (from the past 240 hour(s)).  Studies/Results: Dg Chest 2 View  11/16/2012  *RADIOLOGY REPORT*  Clinical Data: Dyspnea, shortness of breath  CHEST - 2 VIEW  Comparison: 12/22/2011  Findings: Cardiomegaly is noted.  Study is markedly limited by patient's large body habitus and poor inspiration.  Lung bases are obscured by overlying abdominal and chest wall tissue.  Mild interstitial prominence bilaterally.  Mild interstitial edema cannot be excluded.  No gross segmental infiltrate.  IMPRESSION: Markedly limited study as described  above.  Cardiomegaly is noted. Mild interstitial prominence bilaterally.  Mild interstitial edema cannot be excluded.  No gross segmental infiltrate.   Original Report Authenticated By: Natasha Mead, M.D.    Dg Wrist Complete Left  11/16/2012  *RADIOLOGY REPORT*  Clinical Data: Bilateral wrist pain.  Question gout.  LEFT WRIST - COMPLETE 3+ VIEW  Comparison: None.  Findings: Mild diffuse osteopenia is present.  Soft tissue swelling is present about the wrist.  There are no  erosions.  Joint spaces are preserved.  IMPRESSION: Osteopenia and diffuse soft tissue swelling.  This could represent an inflammatory arthritis although no erosions are evident.   Original Report Authenticated By: Marin Roberts, M.D.    Dg Wrist Complete Right  11/16/2012  *RADIOLOGY REPORT*  Clinical Data: Bilateral wrist pain.  Question gout.  RIGHT WRIST - COMPLETE 3+ VIEW  Comparison: Left wrist films of the same day.  Findings: Mild osteopenia is present.  There is some loss of joint space at the [facility] joints.  Joint spaces are otherwise preserved. Subchondral cystic changes are noted in the distal ulna and scaphoid.  A periarticular erosion is suggested at the distal first metacarpal.  IMPRESSION:  1.  This changes compatible with an inflammatory arthritis, suggesting rheumatoid arthritis or possibly early gout.   Original Report Authenticated By: Marin Roberts, M.D.     Medications: Scheduled Meds:   . [COMPLETED] sodium chloride   Intravenous Once  . calcium gluconate  1 g Intravenous Once  . diltiazem  360 mg Oral Daily  . [COMPLETED] diltiazem  10 mg Intravenous Once  . exemestane  25 mg Oral QPC breakfast  . insulin glargine  20 Units Subcutaneous QHS  . metoprolol tartrate  25 mg Oral BID  . [COMPLETED]  morphine injection  4 mg Intravenous Once  . [COMPLETED] oxyCODONE-acetaminophen  1 tablet Oral Once  . phytonadione  10 mg Subcutaneous Once  . [COMPLETED] predniSONE  40 mg Oral Once  . predniSONE  40 mg Oral Q breakfast  . simvastatin  40 mg Oral QHS  . [COMPLETED] sodium chloride  1,000 mL Intravenous Once  . theophylline  200 mg Oral BID  . torsemide  20 mg Oral BID  . [DISCONTINUED] diltiazem  360 mg Oral Daily  . [DISCONTINUED] potassium chloride SA  20 mEq Oral BID  . [DISCONTINUED] warfarin  4-6 mg Oral Daily      LOS: 1 day   RAI,RIPUDEEP M.D. [facility] 11/17/2012, 10:50 AM Pager: [phone]  If 7PM-7AM, please contact  night-coverage [email or web address] Password [email or web address]

## 2012-11-17 NOTE — Assessment & Plan Note (Signed)
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

## 2012-11-18 DIAGNOSIS — E119 Type 2 diabetes mellitus without complications: Secondary | ICD-10-CM

## 2012-11-18 DIAGNOSIS — N179 Acute kidney failure, unspecified: Secondary | ICD-10-CM

## 2012-11-18 DIAGNOSIS — D649 Anemia, unspecified: Secondary | ICD-10-CM

## 2012-11-18 DIAGNOSIS — J40 Bronchitis, not specified as acute or chronic: Secondary | ICD-10-CM

## 2012-11-18 LAB — GLUCOSE, CAPILLARY
Glucose-Capillary: 460 mg/dL — ABNORMAL HIGH (ref 70–99)
Glucose-Capillary: 441 mg/dL — ABNORMAL HIGH (ref 70–99)

## 2012-11-18 LAB — BASIC METABOLIC PANEL
BUN: 68 mg/dL — ABNORMAL HIGH (ref 6–23)
CO2: 22 mEq/L (ref 19–32)
Chloride: 89 mEq/L — ABNORMAL LOW (ref 96–112)
GFR calc Af Amer: 23 mL/min — ABNORMAL LOW (ref >90)
Glucose, Bld: 386 mg/dL — ABNORMAL HIGH (ref 70–99)
Potassium: 4.1 mEq/L (ref 3.5–5.1)

## 2012-11-18 LAB — CBC
HCT: 36.1 % (ref 36.0–46.0)
Hemoglobin: 12 g/dL (ref 12.0–15.0)
MCV: 75.5 fL — ABNORMAL LOW (ref 78.0–100.0)
RBC: 4.78 MIL/uL (ref 3.87–5.11)
RDW: 16.1 % — ABNORMAL HIGH (ref 11.5–15.5)
WBC: 5.5 10*3/uL (ref 4.0–10.5)

## 2012-11-18 LAB — PROTIME-INR: INR: 6.61 (ref 0.00–1.49)

## 2012-11-18 MED ORDER — BISACODYL 10 MG RE SUPP
10.0000 mg | Freq: Once | RECTAL | Status: AC
  Administered 2012-11-18: 10 mg via RECTAL
  Filled 2012-11-18: qty 1

## 2012-11-18 MED ORDER — POLYETHYLENE GLYCOL 3350 17 G PO PACK
17.0000 g | PACK | Freq: Every day | ORAL | Status: DC
  Administered 2012-11-18 – 2012-11-22 (×5): 17 g via ORAL
  Filled 2012-11-18 (×5): qty 1

## 2012-11-18 MED ORDER — INSULIN ASPART 100 UNIT/ML ~~LOC~~ SOLN
4.0000 [IU] | Freq: Three times a day (TID) | SUBCUTANEOUS | Status: DC
  Administered 2012-11-18 – 2012-11-19 (×4): 4 [IU] via SUBCUTANEOUS

## 2012-11-18 MED ORDER — ALLOPURINOL 100 MG PO TABS
100.0000 mg | ORAL_TABLET | Freq: Every day | ORAL | Status: DC
  Administered 2012-11-18 – 2012-11-21 (×4): 100 mg via ORAL
  Filled 2012-11-18 (×4): qty 1

## 2012-11-18 MED ORDER — INSULIN ASPART 100 UNIT/ML ~~LOC~~ SOLN
6.0000 [IU] | Freq: Once | SUBCUTANEOUS | Status: AC
  Administered 2012-11-18: 6 [IU] via SUBCUTANEOUS

## 2012-11-18 MED ORDER — ENSURE COMPLETE PO LIQD
237.0000 mL | ORAL | Status: DC
  Administered 2012-11-18 – 2012-11-21 (×3): 237 mL via ORAL

## 2012-11-18 MED ORDER — INSULIN GLARGINE 100 UNIT/ML ~~LOC~~ SOLN
30.0000 [IU] | Freq: Every day | SUBCUTANEOUS | Status: DC
  Administered 2012-11-18: 30 [IU] via SUBCUTANEOUS

## 2012-11-18 MED ORDER — INSULIN ASPART 100 UNIT/ML ~~LOC~~ SOLN
20.0000 [IU] | Freq: Once | SUBCUTANEOUS | Status: AC
  Administered 2012-11-18: 20 [IU] via SUBCUTANEOUS

## 2012-11-18 MED ORDER — INSULIN ASPART 100 UNIT/ML ~~LOC~~ SOLN
0.0000 [IU] | Freq: Three times a day (TID) | SUBCUTANEOUS | Status: DC
  Administered 2012-11-18: 15 [IU] via SUBCUTANEOUS
  Administered 2012-11-19 (×3): 20 [IU] via SUBCUTANEOUS
  Administered 2012-11-20 (×2): 15 [IU] via SUBCUTANEOUS
  Administered 2012-11-21: 20 [IU] via SUBCUTANEOUS
  Administered 2012-11-21: 11 [IU] via SUBCUTANEOUS
  Administered 2012-11-21: 15 [IU] via SUBCUTANEOUS
  Administered 2012-11-22: 7 [IU] via SUBCUTANEOUS
  Administered 2012-11-22: 4 [IU] via SUBCUTANEOUS

## 2012-11-18 NOTE — Progress Notes (Signed)
CRITICAL VALUE ALERT  Critical value received:  INR 6.6  Date of notification:  11/18/2012  Time of notification:  0650  Critical value read back:yes  Nurse who received alert:  Ophelia Charter RN  MD notified (1st page):  Lenny Pastel NP  Time of first page:  [phone]  MD notified (2nd page):  Time of second page:  Responding MD:  Lenny Pastel NP  Time MD responded:  [phone]

## 2012-11-18 NOTE — Progress Notes (Signed)
INITIAL ADULT NUTRITION ASSESSMENT Date: 11/18/2012   Time: 2:15 PM Reason for Assessment: Nutrition Risk-24# weight loss in the past 6 months  ASSESSMENT: Female 71 y.o.  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 Diabetes mellitus   . Asthma   . Bronchitis   . Hernia   . Anemia   . Cancer   . Arthritis   . Hernia   . Morbid obesity   . Sarcoid   . OSA (obstructive sleep apnea)   . Atrial fibrillation   . Breast cancer, stage 1 10/17/2011  . Cough   . Wheezing   . Chills   . Atrial fibrillation   . Constipation   . Bruises easily    Past Surgical History  Procedure Date  . Total knee arthroplasty 2001  . Abdominal hysterectomy 2006  . Resection of uterine cancer   . Left lower leg fx--casted   . Breast surgery     Related Meds:  Scheduled Meds:   . allopurinol  100 mg Oral Daily  . diltiazem  360 mg Oral Daily  . docusate sodium  100 mg Oral BID  . exemestane  25 mg Oral QPC breakfast  . insulin aspart  0-20 Units Subcutaneous TID WC  . insulin aspart  0-5 Units Subcutaneous QHS  . insulin aspart  4 Units Subcutaneous TID WC  . insulin glargine  30 Units Subcutaneous QHS  . metoprolol tartrate  25 mg Oral BID  . predniSONE  40 mg Oral Q breakfast  . simvastatin  40 mg Oral QHS  . theophylline  200 mg Oral BID  . [DISCONTINUED] insulin aspart  0-15 Units Subcutaneous TID WC  . [DISCONTINUED] insulin glargine  20 Units Subcutaneous QHS   Continuous Infusions:   . sodium chloride 20 mL/hr (11/17/12 1530)   PRN Meds:.albuterol, HYDROcodone-acetaminophen, ipratropium, LORazepam   Ht: 5' (152.4 cm)  Wt: 280 lb 3.3 oz (127.1 kg)  Ideal Wt: 45.5 kg  % Ideal Wt: .280  Usual Wt:  Wt Readings from Last 10 Encounters:  11/16/12 280 lb 3.3 oz (127.1 kg)  11/02/12 274 lb 3.2 oz (124.376 kg)  08/08/12 294 lb (133.358 kg)  05/04/12 293 lb (132.904 kg)  02/02/12 281 lb 6.4 oz (127.642 kg)  01/19/12 279 lb 3.2 oz (126.644 kg)   12/15/11 276 lb (125.193 kg)  10/17/11 290 lb 9.6 oz (131.815 kg)  09/22/11 297 lb 9.6 oz (134.99 kg)  08/05/11 306 lb (138.801 kg)   % Usual Wt: 92% from 3 months ago  Body mass index is 54.72 kg/(m^2).-extreme obesity  Labs:  CMP     Component Value Date/Time   NA 126* 11/18/2012 0545   NA 141 08/08/2012 1402   K 4.1 11/18/2012 0545   K 3.8 08/08/2012 1402   CL 89* 11/18/2012 0545   CL 99 08/08/2012 1402   CO2 22 11/18/2012 0545   CO2 30* 08/08/2012 1402   GLUCOSE 386* 11/18/2012 0545   GLUCOSE 226* 08/08/2012 1402   BUN 68* 11/18/2012 0545   BUN 18.0 08/08/2012 1402   CREATININE 2.30* 11/18/2012 0545   CREATININE 1.1 08/08/2012 1402   CALCIUM 8.4 11/18/2012 0545   CALCIUM 9.9 08/08/2012 1402   PROT 7.1 11/17/2012 0540   PROT 6.8 08/08/2012 1402   ALBUMIN 2.6* 11/17/2012 0540   ALBUMIN 3.8 08/08/2012 1402   AST 9 11/17/2012 0540   AST 14 08/08/2012 1402   ALT 7 11/17/2012 0540  ALT 11 08/08/2012 1402   ALKPHOS 75 11/17/2012 0540   ALKPHOS 84 08/08/2012 1402   BILITOT 0.8 11/17/2012 0540   BILITOT 0.90 08/08/2012 1402   GFRNONAA 20* 11/18/2012 0545   GFRAA 23* 11/18/2012 0545   I/O last 3 completed shifts: In: 2521.2 [P.O.:2030; I.V.:491.2] Out: 550 [Urine:550] Total I/O In: 360 [P.O.:360] Out: -    Diet Order: Carb Control  Supplements/Tube Feeding:  none  IVF:    sodium chloride Last Rate: 20 mL/hr (11/17/12 1530)    Estimated Nutritional Needs:   Kcal: 1700-1900 Protein: 75-85 gm Fluid: 1.8L Food/Nutrition Related Hx: Spoke with patient who reports early satiety.  Tries to make healthy meal choices but unable to eat much at times.  CHO MOD diet prior to admit.  Current intake is 50-100% meals.  NUTRITION DIAGNOSIS: -Inadequate oral intake (NI-2.1).  Status: Ongoing  RELATED TO: early satiety  AS EVIDENCE BY: pt report, documented intake  MONITORING/EVALUATION(Goals): Intake, labs, weight  EDUCATION NEEDS: -No education needs identified at this  time  INTERVENTION: Ensure Complete 1 can daily per pt request. Encouraged healthy intake   DOCUMENTATION CODES Per approved criteria  -Obesity - extreme    Oran Rein, RD, LDN Clinical Inpatient Dietitian Pager:  [phone] Weekend and after hours pager:  [phone]   11/18/2012, 2:15 PM

## 2012-11-18 NOTE — Progress Notes (Signed)
CRITICAL VALUE ALERT  Critical value received:  CBG 421  Date of notification:11/18/12  Time of notification:  1700  Critical value read back:Yes  Nurse who received alert:JH  MD notified (1st page): 1710  Time of first page: 1705  MD notified (2nd page):  Time of second page:  Responding MD:  Dr. Isidoro Donning  Time MD responded: 1710

## 2012-11-18 NOTE — Progress Notes (Signed)
Dr. Isidoro Donning via phone given update regarding CBG 421. Order given to DC Ensure supplement. New Orders given for Insulin to Cover CBG.

## 2012-11-18 NOTE — Progress Notes (Signed)
ANTICOAGULATION CONSULT NOTE - Follow Up Consult  Pharmacy Consult for Coumadin Indication: atrial fibrillation  Allergies  Allergen Reactions  . Pravachol Other (See Comments)    Muscle pain  . Pravastatin Sodium     Patient Measurements: Height: 5' (152.4 cm) Weight: 280 lb 3.3 oz (127.1 kg) IBW/kg (Calculated) : 45.5   Vital Signs: Temp: 97.8 F (36.6 C) (12/08 0553) Temp src: Oral (12/08 0553) BP: 113/67 mmHg (12/08 0553) Pulse Rate: 60  (12/08 0553)  Labs:  Basename 11/18/12 0545 11/17/12 0540 11/16/12 1930 11/16/12 1554 11/16/12 1545  HGB 12.0 13.1 -- -- --  HCT 36.1 38.2 -- 40.0 --  PLT 212 187 -- -- 190  APTT -- -- -- -- --  LABPROT 53.1* 61.4* 51.2* -- --  INR 6.61* 8.04* 6.30* -- --  HEPARINUNFRC -- -- -- -- --  CREATININE 2.30* 1.60* -- 1.40* --  CKTOTAL -- -- -- -- --  CKMB -- -- -- -- --  TROPONINI -- -- -- -- --    Estimated Creatinine Clearance: 26.9 ml/min (by C-G formula based on Cr of 2.3).   Medications:  Scheduled:     . diltiazem  360 mg Oral Daily  . docusate sodium  100 mg Oral BID  . exemestane  25 mg Oral QPC breakfast  . insulin aspart  0-15 Units Subcutaneous TID WC  . insulin aspart  0-5 Units Subcutaneous QHS  . insulin glargine  20 Units Subcutaneous QHS  . metoprolol tartrate  25 mg Oral BID  . [COMPLETED] phytonadione  5 mg Subcutaneous Once  . predniSONE  40 mg Oral Q breakfast  . simvastatin  40 mg Oral QHS  . theophylline  200 mg Oral BID  . [DISCONTINUED] calcium gluconate  1 g Intravenous Once  . [DISCONTINUED] phytonadione  10 mg Subcutaneous Once  . [DISCONTINUED] potassium chloride SA  20 mEq Oral BID  . [DISCONTINUED] torsemide  20 mg Oral BID   Infusions:     . sodium chloride 20 mL/hr (11/17/12 1530)    Assessment:  39 yof with h/o atrial fibrillation on chronic Coumadin.  Admitted 12/6 for Afib with RVR.  Patient was reportedly on Coumadin 6 mg on MWF and 4 mg other days.  INR remains  supratherapeutic but decreased after Vit K 5mg  sq yesterday  CBC stable, no bleeding reported (additional vitamin K likely unnecessary unless bleeding occurs).  Goal of Therapy:  INR 2-3   Plan:   No coumadin today  Daily PT/INR  Loralee Pacas, PharmD, BCPS Pager: [phone] 11/18/2012,7:33 AM

## 2012-11-18 NOTE — Progress Notes (Addendum)
Patient ID: [name]  female  YNW:[id number]    DOB: 1941-09-18    DOA: 11/16/2012  PCP: [name], MD  Addendum:  Per CDI query, patient's BMI is 54 which places her in range of extreme obesity.    [name] M.D. [facility] 11/30/2012, 2:32 PM  Pager: [phone]     Assessment/Plan: Principal Problem:  *Gout attack: Due to acute renal insufficiency unable to give colchicine or NSAIDs for the pain, pain improving - Continue prednisone, added renal dosed allopurinol - serum uric acid 11.2, RA factor slightly elevated at 18, I will check CCP antibodies  Active Problems:  Afib with RVR: Right now controlled, INR supratherapeutic, trending down to 6.6  - Continue beta blocker and Cardizem, hold Coumadin - Will give one dose of vitamin K   Breast cancer, stage 1: cont aromasin   Diabetes mellitus: Blood sugars uncontrolled due to prednisone - Increase Lantus, placed on a meal coverage, resistant sliding scale   Hyponatremia: Psuedo-hyponatremia, corrected sodium 131 with a glucose of 386   Acute renal failure: - Hold off on torsemide until creatinine function is improving - No IV fluids    DVT Prophylaxis:  Code Status:  Disposition: Hopefully tomorrow. Patient wants to stay in the hospital until she is able to feed herself. She has family and 2 children but states that they do work and nobody will be with her 24/7, Will do PT evaluation to see if she qualifies for rehabilitation. I also explained to her that she is on observation status and needs to talk to her family to discuss living arrangements for a few days until she is back on her feet.   Subjective: Feeling somewhat better today. Pain controlled, swelling on the wrists improving  Objective: Weight change:   Intake/Output Summary (Last 24 hours) at 11/18/12 1131 Last data filed at 11/18/12 0900  Gross per 24 hour  Intake   2115 ml  Output    550 ml  Net   1565 ml   Blood pressure 110/66,  pulse 60, temperature 97.8 F (36.6 C), temperature source Oral, resp. rate 18, height 5' (1.524 m), weight 127.1 kg (280 lb 3.3 oz), SpO2 94.00%.  Physical Exam: General: Alert and awake, oriented x3, not in any acute distress. HEENT: anicteric sclera, pupils reactive to light and accommodation, EOMI CVS: S1-S2 clear, no murmur rubs or gallops Chest: clear to auscultation bilaterally, no wheezing, rales or rhonchi Abdomen: soft nontender, nondistended, normal bowel sounds, no organomegaly Extremities: no cyanosis, clubbing or edema noted bilaterally. Both wrists swollen but improving Neuro: Cranial nerves II-XII intact, no focal neurological deficits  Lab Results: Basic Metabolic Panel:  Lab 11/18/12 6295 11/17/12 1845 11/17/12 0540  NA 126* -- 131*  K 4.1 -- 4.1  CL 89* -- 92*  CO2 22 -- 25  GLUCOSE 386* 458* --  BUN 68* -- 45*  CREATININE 2.30* -- 1.60*  CALCIUM 8.4 -- 8.7  MG 1.5 -- --  PHOS -- -- --   Liver Function Tests:  Lab 11/17/12 0540  AST 9  ALT 7  ALKPHOS 75  BILITOT 0.8  PROT 7.1  ALBUMIN 2.6*   No results found for this basename: LIPASE:2,AMYLASE:2 in the last 168 hours No results found for this basename: AMMONIA:2 in the last 168 hours CBC:  Lab 11/18/12 0545 11/17/12 0540 11/16/12 1545  WBC 5.5 6.7 --  NEUTROABS -- -- 3.8  HGB 12.0 13.1 --  HCT 36.1 38.2 --  MCV 75.5* 76.6* --  PLT 212 187 --   Cardiac Enzymes: No results found for this basename: CKTOTAL:3,CKMB:3,CKMBINDEX:3,TROPONINI:3 in the last 168 hours BNP: No components found with this basename: POCBNP:2 CBG:  Lab 11/18/12 0739 11/17/12 2201 11/17/12 1708 11/17/12 1201 11/16/12 2306  GLUCAP 366* 386* 417* 327* 219*     Micro Results: No results found for this or any previous visit (from the past 240 hour(s)).  Studies/Results: Dg Chest 2 View  11/16/2012  *RADIOLOGY REPORT*  Clinical Data: Dyspnea, shortness of breath  CHEST - 2 VIEW  Comparison: 12/22/2011  Findings:  Cardiomegaly is noted.  Study is markedly limited by patient's large body habitus and poor inspiration.  Lung bases are obscured by overlying abdominal and chest wall tissue.  Mild interstitial prominence bilaterally.  Mild interstitial edema cannot be excluded.  No gross segmental infiltrate.  IMPRESSION: Markedly limited study as described above.  Cardiomegaly is noted. Mild interstitial prominence bilaterally.  Mild interstitial edema cannot be excluded.  No gross segmental infiltrate.   Original Report Authenticated By: Natasha Mead, M.D.    Dg Wrist Complete Left  11/16/2012  *RADIOLOGY REPORT*  Clinical Data: Bilateral wrist pain.  Question gout.  LEFT WRIST - COMPLETE 3+ VIEW  Comparison: None.  Findings: Mild diffuse osteopenia is present.  Soft tissue swelling is present about the wrist.  There are no erosions.  Joint spaces are preserved.  IMPRESSION: Osteopenia and diffuse soft tissue swelling.  This could represent an inflammatory arthritis although no erosions are evident.   Original Report Authenticated By: Marin Roberts, M.D.    Dg Wrist Complete Right  11/16/2012  *RADIOLOGY REPORT*  Clinical Data: Bilateral wrist pain.  Question gout.  RIGHT WRIST - COMPLETE 3+ VIEW  Comparison: Left wrist films of the same day.  Findings: Mild osteopenia is present.  There is some loss of joint space at the [facility] joints.  Joint spaces are otherwise preserved. Subchondral cystic changes are noted in the distal ulna and scaphoid.  A periarticular erosion is suggested at the distal first metacarpal.  IMPRESSION:  1.  This changes compatible with an inflammatory arthritis, suggesting rheumatoid arthritis or possibly early gout.   Original Report Authenticated By: Marin Roberts, M.D.     Medications: Scheduled Meds:    . allopurinol  100 mg Oral Daily  . diltiazem  360 mg Oral Daily  . docusate sodium  100 mg Oral BID  . exemestane  25 mg Oral QPC breakfast  . insulin aspart  0-20 Units  Subcutaneous TID WC  . insulin aspart  0-5 Units Subcutaneous QHS  . insulin aspart  4 Units Subcutaneous TID WC  . insulin glargine  30 Units Subcutaneous QHS  . metoprolol tartrate  25 mg Oral BID  . [COMPLETED] phytonadione  5 mg Subcutaneous Once  . predniSONE  40 mg Oral Q breakfast  . simvastatin  40 mg Oral QHS  . theophylline  200 mg Oral BID  . [DISCONTINUED] insulin aspart  0-15 Units Subcutaneous TID WC  . [DISCONTINUED] insulin glargine  20 Units Subcutaneous QHS      LOS: 2 days   [name] M.D. [facility] 11/18/2012, 11:31 AM Pager: [phone]  If 7PM-7AM, please contact night-coverage [email or web address] Password [email or web address]

## 2012-11-19 ENCOUNTER — Inpatient Hospital Stay (HOSPITAL_COMMUNITY): Payer: Medicare Other

## 2012-11-19 DIAGNOSIS — R609 Edema, unspecified: Secondary | ICD-10-CM

## 2012-11-19 DIAGNOSIS — E662 Morbid (severe) obesity with alveolar hypoventilation: Secondary | ICD-10-CM

## 2012-11-19 DIAGNOSIS — G4733 Obstructive sleep apnea (adult) (pediatric): Secondary | ICD-10-CM

## 2012-11-19 LAB — CBC
HCT: 35.3 % — ABNORMAL LOW (ref 36.0–46.0)
Hemoglobin: 12 g/dL (ref 12.0–15.0)
MCH: 25.7 pg — ABNORMAL LOW (ref 26.0–34.0)
MCV: 75.6 fL — ABNORMAL LOW (ref 78.0–100.0)
RBC: 4.67 MIL/uL (ref 3.87–5.11)

## 2012-11-19 LAB — BASIC METABOLIC PANEL
BUN: 65 mg/dL — ABNORMAL HIGH (ref 6–23)
CO2: 21 mEq/L (ref 19–32)
Calcium: 8.6 mg/dL (ref 8.4–10.5)
Creatinine, Ser: 1.46 mg/dL — ABNORMAL HIGH (ref 0.50–1.10)
Glucose, Bld: 461 mg/dL — ABNORMAL HIGH (ref 70–99)

## 2012-11-19 LAB — CYCLIC CITRUL PEPTIDE ANTIBODY, IGG: Cyclic Citrullin Peptide Ab: 3.1 U/mL (ref 0.0–5.0)

## 2012-11-19 LAB — HEMOGLOBIN A1C
Hgb A1c MFr Bld: 8.7 % — ABNORMAL HIGH (ref <5.7)
Mean Plasma Glucose: 203 mg/dL — ABNORMAL HIGH (ref <117)

## 2012-11-19 LAB — GLUCOSE, CAPILLARY: Glucose-Capillary: 468 mg/dL — ABNORMAL HIGH (ref 70–99)

## 2012-11-19 MED ORDER — INSULIN GLARGINE 100 UNIT/ML ~~LOC~~ SOLN
35.0000 [IU] | Freq: Every day | SUBCUTANEOUS | Status: DC
  Administered 2012-11-19: 35 [IU] via SUBCUTANEOUS

## 2012-11-19 MED ORDER — INSULIN GLARGINE 100 UNIT/ML ~~LOC~~ SOLN: 35.0000 [IU] | Freq: Every day | SUBCUTANEOUS | Status: DC

## 2012-11-19 MED ORDER — INSULIN ASPART 100 UNIT/ML ~~LOC~~ SOLN
8.0000 [IU] | Freq: Three times a day (TID) | SUBCUTANEOUS | Status: DC
  Administered 2012-11-20 – 2012-11-21 (×4): 8 [IU] via SUBCUTANEOUS

## 2012-11-19 MED ORDER — INSULIN ASPART 100 UNIT/ML ~~LOC~~ SOLN
10.0000 [IU] | Freq: Once | SUBCUTANEOUS | Status: AC
  Administered 2012-11-19: 10 [IU] via SUBCUTANEOUS

## 2012-11-19 MED ORDER — PREDNISONE 20 MG PO TABS
30.0000 mg | ORAL_TABLET | Freq: Every day | ORAL | Status: DC
  Administered 2012-11-20 – 2012-11-22 (×3): 30 mg via ORAL
  Filled 2012-11-19 (×4): qty 1

## 2012-11-19 MED ORDER — INSULIN ASPART 100 UNIT/ML ~~LOC~~ SOLN
6.0000 [IU] | Freq: Three times a day (TID) | SUBCUTANEOUS | Status: DC
  Administered 2012-11-19: 6 [IU] via SUBCUTANEOUS

## 2012-11-19 MED ORDER — WARFARIN SODIUM 4 MG PO TABS
4.0000 mg | ORAL_TABLET | Freq: Once | ORAL | Status: AC
  Administered 2012-11-19: 4 mg via ORAL
  Filled 2012-11-19 (×2): qty 1

## 2012-11-19 MED ORDER — TORSEMIDE 20 MG PO TABS
20.0000 mg | ORAL_TABLET | Freq: Every day | ORAL | Status: DC
  Administered 2012-11-19 – 2012-11-22 (×4): 20 mg via ORAL
  Filled 2012-11-19 (×4): qty 1

## 2012-11-19 MED ORDER — INSULIN ASPART 100 UNIT/ML ~~LOC~~ SOLN
6.0000 [IU] | Freq: Once | SUBCUTANEOUS | Status: AC
  Administered 2012-11-19: 6 [IU] via SUBCUTANEOUS

## 2012-11-19 MED ORDER — WARFARIN - PHARMACIST DOSING INPATIENT: Freq: Every day | Status: DC

## 2012-11-19 NOTE — Evaluation (Signed)
Occupational Therapy Evaluation Patient Details Name: [name] MRN: [id number] DOB: 04-29-1941 Today's Date: 11/19/2012 Time: 4401-0272 OT Time Calculation (min): 35 min  OT Assessment / Plan / Recommendation Clinical Impression  Pt is a 71 yo female living alone who presents with a gout flare up, afib with RVR. Pt would not be able to successfully care for self at home in current condition. Skilled OT indicated to maximize independence with BADLs to min-mod A level in prep for d/c to next venue of care.    OT Assessment  Patient needs continued OT Services    Follow Up Recommendations  SNF    Barriers to Discharge Decreased caregiver support    Equipment Recommendations  None recommended by OT    Recommendations for Other Services    Frequency  Min 2X/week    Precautions / Restrictions Precautions Precautions: Fall   Pertinent Vitals/Pain Pt denied pain but all PIPs of both hands appear edemetous.    ADL  Grooming: Performed;Wash/dry hands;Teeth care;Brushing hair;Minimal assistance Where Assessed - Grooming: Unsupported sitting Upper Body Bathing: Minimal assistance;Performed Where Assessed - Upper Body Bathing: Unsupported sitting Lower Body Bathing: Performed;+1 Total assistance Where Assessed - Lower Body Bathing: Supported sit to stand Upper Body Dressing: Performed;Minimal assistance Where Assessed - Upper Body Dressing: Unsupported sitting Lower Body Dressing: Simulated;+1 Total assistance Where Assessed - Lower Body Dressing: Supported sit to stand Toilet Transfer: Simulated;+2 Total assistance Toilet Transfer: Patient Percentage: 60% Statistician Method: Surveyor, minerals: Other (comment) (to recliner) Toileting - Clothing Manipulation and Hygiene: Simulated;+1 Total assistance Where Assessed - Toileting Clothing Manipulation and Hygiene: Sit to stand from 3-in-1 or toilet Equipment Used: Rolling walker ADL Comments: Pt fatigues  quickly although no SOB noted on 2L of O2.     OT Diagnosis: Generalized weakness  OT Problem List: Decreased activity tolerance;Decreased strength;Decreased coordination;Decreased range of motion;Impaired UE functional use;Decreased knowledge of use of DME or AE;Increased edema OT Treatment Interventions: Self-care/ADL training;Therapeutic activities;DME and/or AE instruction;Patient/family education   OT Goals Acute Rehab OT Goals OT Goal Formulation: With patient Time For Goal Achievement: 12/03/12 Potential to Achieve Goals: Good ADL Goals Pt Will Perform Eating: with set-up;Sitting, chair;Supine, head of bed up;Supported;with adaptive utensils ADL Goal: Eating - Progress: Goal set today Pt Will Perform Grooming: with set-up;Sitting, chair;Sitting, edge of bed;Unsupported ADL Goal: Grooming - Progress: Goal set today Pt Will Transfer to Toilet: with min assist;with DME;Ambulation;Stand pivot transfer ADL Goal: Toilet Transfer - Progress: Goal set today Pt Will Perform Toileting - Clothing Manipulation: with mod assist;Sitting on 3-in-1 or toilet;Standing ADL Goal: Toileting - Clothing Manipulation - Progress: Goal set today Pt Will Perform Toileting - Hygiene: with mod assist;Sit to stand from 3-in-1/toilet ADL Goal: Toileting - Hygiene - Progress: Goal set today Additional ADL Goal #1: Pt will complete all aspects of LB dressing/bathing with mod A. ADL Goal: Additional Goal #1 - Progress: Goal set today  Visit Information  Last OT Received On: 11/19/12 Assistance Needed: +2 PT/OT Co-Evaluation/Treatment: Yes    Subjective Data  Subjective: There's no way I can go home and take care of myself. Patient Stated Goal: Go to [address] for st snf.   Prior Functioning     Home Living Lives With: Alone Type of Home: Apartment Home Access: Level entry Home Layout: One level Bathroom Shower/Tub: Engineer, manufacturing systems: Standard Home Adaptive Equipment: Shower chair  without back;Grab bars around toilet;Grab bars in [employer] - four wheeled Prior Function Level of Independence: Independent  with assistive device(s) Driving: Yes Vocation: Retired Musician: No difficulties Dominant Hand: Right         Vision/Perception     Cognition  Overall Cognitive Status: Appears within functional limits for tasks assessed/performed Arousal/Alertness: Awake/alert Orientation Level: Appears intact for tasks assessed Behavior During Session: [facility] for tasks performed    Extremity/Trunk Assessment Right Upper Extremity Assessment RUE ROM/Strength/Tone: Deficits RUE ROM/Strength/Tone Deficits: Pt only able to demo ~70 of shoulder flexion WFL at elbow and distally. 3/5 strength at best. Weak grip. Pt was able to keep shoulder elevated to apply deodorant. RUE Coordination: Deficits RUE Coordination Deficits: Pt only able to demo ~70 of shoulder flexion WFL at elbow and distally. 3/5 strength at best. Weak grip. Pt was able to keep shoulder elevated to apply deodorant. Left Upper Extremity Assessment LUE ROM/Strength/Tone: Deficits LUE ROM/Strength/Tone Deficits: Pt unable to complet thumb to finger opposition, difficulty manipulating toothbrush and utensils. LUE Sensation: WFL - Light Touch;WFL - Proprioception LUE Coordination: Deficits LUE Coordination Deficits: Pt unable to complet thumb to finger opposition, difficulty manipulating toothbrush and utensils.     Mobility Bed Mobility Bed Mobility: Supine to Sit Supine to Sit: 3: Mod assist;HOB elevated;With rails Details for Bed Mobility Assistance: mod A needed to elevate trunk. Cues for hand placement and safety. Transfers Transfers: Sit to Stand;Stand to Sit Sit to Stand: 1: +2 Total assist;From bed;With upper extremity assist Sit to Stand: Patient Percentage: 60% Stand to Sit: 1: +2 Total assist;To chair/3-in-1;With upper extremity assist Stand to Sit: Patient Percentage:  60% Details for Transfer Assistance: Performed x2 to allow therapists to provide peri care.     Shoulder Instructions     Exercise     Balance Static Sitting Balance Static Sitting - Balance Support: Left upper extremity supported;Feet supported Static Sitting - Level of Assistance: 5: Stand by assistance   End of Session OT - End of Session Activity Tolerance: Patient limited by fatigue Patient left: in chair;with call bell/phone within reach Nurse Communication: Mobility status  GO Functional Assessment Tool Used: Clinical Judgement Functional Limitation: Self care Self Care Current Status (Z6109): At least 40 percent but less than 60 percent impaired, limited or restricted Self Care Goal Status (U0454): At least 20 percent but less than 40 percent impaired, limited or restricted   Derek Laughter A OTR/L [phone] 11/19/2012, 10:39 AM

## 2012-11-19 NOTE — Progress Notes (Signed)
Physical Therapy Treatment Patient Details Name: [name] MRN: [id number] DOB: 14-Nov-1941 Today's Date: 11/19/2012 Time: 7253-6644 PT Time Calculation (min): 22 min  PT Assessment / Plan / Recommendation Comments on Treatment Session  Pt. has c/o dizzines that occurs  when pt. transitions movements to sitting /to stand. Pt reports she has been on meclazine recently for dizziness. Pt. will benefit from SNF for rehab.    Follow Up Recommendations  SNF     Does the patient have the potential to tolerate intense rehabilitation     Barriers to Discharge        Equipment Recommendations  None recommended by PT    Recommendations for Other Services    Frequency Min 3X/week   Plan Discharge plan remains appropriate;Frequency remains appropriate    Precautions / Restrictions Precautions Precautions: Fall   Pertinent Vitals/Pain States hands are sore.  sats .94%  2 l. Noted 3/4 dyspnea.    Mobility  Bed Mobility Bed Mobility: Sit to Supine Sit to Supine: 3: Mod assist Details for Bed Mobility Assistance: Pt required physical A for both LEs Transfers Sit to Stand: 1: +2 Total assist;From chair/3-in-1 Sit to Stand: Patient Percentage: 60% Stand to Sit: 1: +2 Total assist;To bed Stand to Sit: Patient Percentage: 60% Stand Pivot Transfers: 1: +2 Total assist Stand Pivot Transfers: Patient Percentage: 60% Details for Transfer Assistance: Physical assistance to stand fro recliner, cues to rach back to recliner. Ambulation/Gait Ambulation/Gait Assistance: Not tested (comment)    Exercises     PT Diagnosis:    PT Problem List:   PT Treatment Interventions:     PT Goals Acute Rehab PT Goals Pt will go Sit to Supine/Side: with modified independence PT Goal: Sit to Supine/Side - Progress: Progressing toward goal Pt will go Sit to Stand: with supervision PT Goal: Sit to Stand - Progress: Progressing toward goal Pt will go Stand to Sit: with supervision PT Goal: Stand to  Sit - Progress: Progressing toward goal Pt will Ambulate: 16 - 50 feet;with supervision;with rolling walker PT Goal: Ambulate - Progress: Progressing toward goal  Visit Information  Last PT Received On: 11/19/12 Assistance Needed: +2 PT/OT Co-Evaluation/Treatment: Yes    Subjective Data  Subjective: I get dizzy when I move., change positions.   Cognition  Overall Cognitive Status: Appears within functional limits for tasks assessed/performed Arousal/Alertness: Awake/alert Orientation Level: Appears intact for tasks assessed Behavior During Session: [facility] for tasks performed    Balance     End of Session PT - End of Session Activity Tolerance: Patient limited by fatigue Patient left: in bed;with call bell/phone within reach Nurse Communication: Mobility status   GP     Rada Hay 11/19/2012, 4:45 [id number]

## 2012-11-19 NOTE — Evaluation (Signed)
Physical Therapy Evaluation Patient Details Name: [name] MRN: [id number] DOB: 1941-11-02 Today's Date: 11/19/2012 Time: 4098-1191 PT Time Calculation (min): 34 min  PT Assessment / Plan / Recommendation Clinical Impression  Pt. was admitted with  pain, gout flare, decreased ability to care for self. Pt. lives alone and has no caregivers. Pt. will benefit from PT while on acute to improve functional independence. Pt. did mobilize to reclire with stand and pivot w/ 2 persons.    PT Assessment  Patient needs continued PT services    Follow Up Recommendations  SNF    Does the patient have the potential to tolerate intense rehabilitation      Barriers to Discharge Decreased caregiver support      Equipment Recommendations  None recommended by PT    Recommendations for Other Services     Frequency Min 3X/week    Precautions / Restrictions Precautions Precautions: Fall Precaution Comments: gout pain   Pertinent Vitals/Pain Stas 95% on Ra HR 95 after pivot.      Mobility  Bed Mobility Bed Mobility: Supine to Sit Supine to Sit: 3: Mod assist;HOB elevated;With rails Details for Bed Mobility Assistance: mod A needed to elevate trunk. Cues for hand placement and safety. Transfers Transfers: Stand Pivot Transfers Sit to Stand: 1: +2 Total assist;From bed;With upper extremity assist Sit to Stand: Patient Percentage: 60% Stand to Sit: 1: +2 Total assist;To chair/3-in-1;With upper extremity assist Stand to Sit: Patient Percentage: 60% Stand Pivot Transfers: 1: +2 Total assist Stand Pivot Transfers: Patient Percentage: 60% Details for Transfer Assistance: extra time for rest breaks, slow  steps, has pain in Ue's  With use of RW Ambulation/Gait Ambulation/Gait Assistance: Not tested (comment)    Shoulder Instructions     Exercises     PT Diagnosis: Difficulty walking;Generalized weakness;Acute pain  PT Problem List: Decreased strength;Decreased knowledge of use of  DME;Decreased activity tolerance;Decreased balance;Decreased mobility;Pain PT Treatment Interventions: DME instruction;Gait training;Functional mobility training;Therapeutic activities;Therapeutic exercise   PT Goals Acute Rehab PT Goals PT Goal Formulation: With patient Time For Goal Achievement: 12/03/12 Potential to Achieve Goals: Good Pt will go Supine/Side to Sit: with modified independence PT Goal: Supine/Side to Sit - Progress: Goal set today Pt will go Sit to Supine/Side: with modified independence PT Goal: Sit to Supine/Side - Progress: Goal set today Pt will go Sit to Stand: with supervision PT Goal: Sit to Stand - Progress: Goal set today Pt will go Stand to Sit: with supervision PT Goal: Stand to Sit - Progress: Goal set today Pt will Ambulate: 16 - 50 feet;with supervision;with rolling walker PT Goal: Ambulate - Progress: Goal set today  Visit Information  Last PT Received On: 11/19/12 Assistance Needed: +2    Subjective Data  Subjective: I am having trouble breathing. Patient Stated Goal: to go to rehab again.   Prior Functioning  Home Living Lives With: Alone Type of Home: Apartment Home Access: Level entry Home Layout: One level Bathroom Shower/Tub: Engineer, manufacturing systems: Standard Home Adaptive Equipment: Shower chair without back;Grab bars around toilet;Grab bars in [employer] - four wheeled Prior Function Level of Independence: Independent with assistive device(s) Driving: Yes Vocation: Retired Musician: No difficulties Dominant Hand: Right    Cognition  Overall Cognitive Status: Appears within functional limits for tasks assessed/performed Arousal/Alertness: Awake/alert Orientation Level: Appears intact for tasks assessed Behavior During Session: [facility] for tasks performed    Extremity/Trunk Assessment Right Upper Extremity Assessment RUE ROM/Strength/Tone: Deficits RUE ROM/Strength/Tone Deficits: Pt only able to demo  ~  70 of shoulder flexion WFL at elbow and distally. 3/5 strength at best. Weak grip. Pt was able to keep shoulder elevated to apply deodorant. RUE Coordination: Deficits RUE Coordination Deficits: Pt only able to demo ~70 of shoulder flexion WFL at elbow and distally. 3/5 strength at best. Weak grip. Pt was able to keep shoulder elevated to apply deodorant. Left Upper Extremity Assessment LUE ROM/Strength/Tone: Deficits LUE ROM/Strength/Tone Deficits: Pt unable to complet thumb to finger opposition, difficulty manipulating toothbrush and utensils. LUE Sensation: WFL - Light Touch;WFL - Proprioception LUE Coordination: Deficits LUE Coordination Deficits: Pt unable to complet thumb to finger opposition, difficulty manipulating toothbrush and utensils. Right Lower Extremity Assessment RLE ROM/Strength/Tone: Deficits RLE ROM/Strength/Tone Deficits: strength grossly 4/5 for standing and pivots RLE Sensation: WFL - Light Touch Left Lower Extremity Assessment LLE ROM/Strength/Tone: Deficits LLE ROM/Strength/Tone Deficits: same as R LLE Sensation: WFL - Light Touch Trunk Assessment Trunk Assessment: Normal   Balance Static Sitting Balance Static Sitting - Balance Support: Left upper extremity supported;Feet supported Static Sitting - Level of Assistance: 5: Stand by assistance  End of Session PT - End of Session Activity Tolerance: Patient limited by fatigue;Patient limited by pain Patient left: in chair;with call bell/phone within reach Nurse Communication: Mobility status  GP Functional Assessment Tool Used: clinical judgement. Functional Limitation: Mobility: Walking and moving around Mobility: Walking and Moving Around Current Status [phone]): At least 40 percent but less than 60 percent impaired, limited or restricted Mobility: Walking and Moving Around Goal Status [phone]): 0 percent impaired, limited or restricted   Rada Hay 11/19/2012, 11:32 AM

## 2012-11-19 NOTE — Progress Notes (Addendum)
Inpatient Diabetes Program Recommendations  AACE/ADA: New Consensus Statement on Inpatient Glycemic Control (2013)  Target Ranges:  Prepandial:   less than 140 mg/dL      Peak postprandial:   less than 180 mg/dL (1-2 hours)      Critically ill patients:  140 - 180 mg/dL   Reason for Visit: Results for CALISA, LUCKENBAUGH (MRN [id number]) as of 11/19/2012 11:32  Ref. Range 11/18/2012 17:04 11/18/2012 21:16 11/18/2012 23:02 11/19/2012 00:39 11/19/2012 07:52  Glucose-Capillary Latest Range: 70-99 mg/dL 782 (H) 956 (H) 213 (H) 435 (H) 430 (H)   CBG's remain greater than 400 mg/dL.  Note patient is on prednisone for gout flare-up which is likely contributing to hyperglycemia.  May consider increasing Novolog coverage to q 4 hours until CBG's less than 200 mg/dL.  Note A1C=8.7% indicating average CBG's approximately 201 mg/dL prior to hospitalization.  Agree with increased Lantus.  Will need titration of insulin as steroids are tapered.  Will follow.    Addendum:  If CBG's remain greater than 400 mg/dL, consider IV insulin.

## 2012-11-19 NOTE — Progress Notes (Signed)
ANTICOAGULATION CONSULT NOTE - Follow Up Consult  Pharmacy Consult for Coumadin Indication: atrial fibrillation  Allergies  Allergen Reactions  . Pravachol Other (See Comments)    Muscle pain  . Pravastatin Sodium    Patient Measurements: Height: 5' (152.4 cm) Weight: 280 lb 3.3 oz (127.1 kg) IBW/kg (Calculated) : 45.5   Vital Signs: Temp: 98.2 F (36.8 C) (12/09 0532) Temp src: Oral (12/09 0532) BP: 138/81 mmHg (12/09 0532) Pulse Rate: 82  (12/09 0532)  Labs:  Basename 11/19/12 0508 11/18/12 0545 11/17/12 0540  HGB 12.0 12.0 --  HCT 35.3* 36.1 38.2  PLT 236 212 187  APTT -- -- --  LABPROT 26.2* 53.1* 61.4*  INR 2.55* 6.61* 8.04*  HEPARINUNFRC -- -- --  CREATININE 1.46* 2.30* 1.60*  CKTOTAL -- -- --  CKMB -- -- --  TROPONINI -- -- --   Estimated Creatinine Clearance: 42.3 ml/min (by C-G formula based on Cr of 1.46).  Medications:  Scheduled:     . allopurinol  100 mg Oral Daily  . [COMPLETED] bisacodyl  10 mg Rectal Once  . diltiazem  360 mg Oral Daily  . docusate sodium  100 mg Oral BID  . exemestane  25 mg Oral QPC breakfast  . feeding supplement  237 mL Oral Q24H  . insulin aspart  0-20 Units Subcutaneous TID WC  . insulin aspart  0-5 Units Subcutaneous QHS  . [COMPLETED] insulin aspart  20 Units Subcutaneous Once  . insulin aspart  4 Units Subcutaneous TID WC  . [COMPLETED] insulin aspart  6 Units Subcutaneous Once  . [COMPLETED] insulin aspart  6 Units Subcutaneous Once  . insulin glargine  30 Units Subcutaneous QHS  . metoprolol tartrate  25 mg Oral BID  . polyethylene glycol  17 g Oral Daily  . predniSONE  40 mg Oral Q breakfast  . simvastatin  40 mg Oral QHS  . theophylline  200 mg Oral BID  . [DISCONTINUED] insulin aspart  0-15 Units Subcutaneous TID WC  . [DISCONTINUED] insulin glargine  20 Units Subcutaneous QHS   Infusions:     . sodium chloride 20 mL/hr (11/17/12 1530)    Assessment:  1 yof with h/o atrial fibrillation on chronic  Coumadin.  Admitted 12/6 for Afib with RVR.  Patient was reportedly on Coumadin 6 mg on MWF and 4 mg other days.  INR now in therapeutic range after Vit K 5mg  sq 12/7  CBC stable, no bleeding reported, tolerating diet well, CBG's continue elevated despite increased insulin. (Ensure per patient request has been discontinued)  Giving Vit K SQ can confer resistance when Warfarin resumed.  Goal of Therapy:  INR 2-3   Plan:   Warfarin 4mg  today  Daily PT/INR  Otho Bellows PharmD Pager: [phone] 11/19/2012,8:46 AM

## 2012-11-19 NOTE — Progress Notes (Signed)
Patient ID: [name]  female  ZOX:[id number]    DOB: June 17, 1941    DOA: 11/16/2012  PCP: [name], MD  Assessment/Plan: Principal Problem:  *Gout attack: Due to acute renal insufficiency unable to give colchicine or NSAIDs for the pain, pain improving - Continue prednisone, and allopurinol - serum uric acid 11.2, RA factor slightly elevated at 18, CCP antibodies are within normal limits hence acute gout attack confirmed  Active Problems:  Afib with RVR: Right now controlled, INR supratherapeutic, trending down to 6.6  - Continue beta blocker and Cardizem, INR 2.55  - Restart Coumadin per pharmacy   Breast cancer, stage 1: cont aromasin   Diabetes mellitus: Blood sugars uncontrolled due to prednisone - Increased Lantus, increased meal coverage, resistant sliding scale   Hyponatremia: Psuedo-hyponatremia, continue current medication   Acute renal failure: Improved - Will restart torsemide daily  - No IV fluids    DVT Prophylaxis: On Coumadin  Code Status: full code  Disposition: PT evaluation recommending skilled nursing facility  Subjective: States that feeling short of breath today, chest x-ray ordered and shows some fluid overload  Bilateral wrist pain improving, swelling improving   Objective: Weight change:   Intake/Output Summary (Last 24 hours) at 11/19/12 1608 Last data filed at 11/19/12 1500  Gross per 24 hour  Intake    887 ml  Output   2200 ml  Net  -1313 ml   Blood pressure 129/71, pulse 85, temperature 98.5 F (36.9 C), temperature source Oral, resp. rate 21, height 5' (1.524 m), weight 127.1 kg (280 lb 3.3 oz), SpO2 96.00%.  Physical Exam: General: Alert and awake, oriented x3, not in any acute distress. HEENT: anicteric sclera, pupils reactive to light and accommodation, EOMI CVS: S1-S2 clear, no murmur rubs or gallops Chest: Decreased breath sound at the bases Abdomen: soft nontender, nondistended, normal bowel sounds, no  organomegaly Extremities: no cyanosis, clubbing or edema noted bilaterally. Both wrists swollen but improving Neuro: Nonfocal  Lab Results: Basic Metabolic Panel:  Lab 11/19/12 8119 11/18/12 0545  NA 129* 126*  K 3.7 4.1  CL 93* 89*  CO2 21 22  GLUCOSE 461* 386*  BUN 65* 68*  CREATININE 1.46* 2.30*  CALCIUM 8.6 8.4  MG -- 1.5  PHOS -- --   Liver Function Tests:  Lab 11/17/12 0540  AST 9  ALT 7  ALKPHOS 75  BILITOT 0.8  PROT 7.1  ALBUMIN 2.6*   No results found for this basename: LIPASE:2,AMYLASE:2 in the last 168 hours No results found for this basename: AMMONIA:2 in the last 168 hours CBC:  Lab 11/19/12 0508 11/18/12 0545 11/16/12 1545  WBC 7.5 5.5 --  NEUTROABS -- -- 3.8  HGB 12.0 12.0 --  HCT 35.3* 36.1 --  MCV 75.6* 75.5* --  PLT 236 212 --   Cardiac Enzymes: No results found for this basename: CKTOTAL:3,CKMB:3,CKMBINDEX:3,TROPONINI:3 in the last 168 hours BNP: No components found with this basename: POCBNP:2 CBG:  Lab 11/19/12 1228 11/19/12 0752 11/19/12 0039 11/18/12 2302 11/18/12 2116  GLUCAP 464* 430* 435* 441* 460*     Micro Results: No results found for this or any previous visit (from the past 240 hour(s)).  Studies/Results: Dg Chest 2 View  11/16/2012  *RADIOLOGY REPORT*  Clinical Data: Dyspnea, shortness of breath  CHEST - 2 VIEW  Comparison: 12/22/2011  Findings: Cardiomegaly is noted.  Study is markedly limited by patient's large body habitus and poor inspiration.  Lung bases are obscured by overlying abdominal and chest  wall tissue.  Mild interstitial prominence bilaterally.  Mild interstitial edema cannot be excluded.  No gross segmental infiltrate.  IMPRESSION: Markedly limited study as described above.  Cardiomegaly is noted. Mild interstitial prominence bilaterally.  Mild interstitial edema cannot be excluded.  No gross segmental infiltrate.   Original Report Authenticated By: Natasha Mead, M.D.    Dg Wrist Complete Left  11/16/2012   *RADIOLOGY REPORT*  Clinical Data: Bilateral wrist pain.  Question gout.  LEFT WRIST - COMPLETE 3+ VIEW  Comparison: None.  Findings: Mild diffuse osteopenia is present.  Soft tissue swelling is present about the wrist.  There are no erosions.  Joint spaces are preserved.  IMPRESSION: Osteopenia and diffuse soft tissue swelling.  This could represent an inflammatory arthritis although no erosions are evident.   Original Report Authenticated By: Marin Roberts, M.D.    Dg Wrist Complete Right  11/16/2012  *RADIOLOGY REPORT*  Clinical Data: Bilateral wrist pain.  Question gout.  RIGHT WRIST - COMPLETE 3+ VIEW  Comparison: Left wrist films of the same day.  Findings: Mild osteopenia is present.  There is some loss of joint space at the [facility] joints.  Joint spaces are otherwise preserved. Subchondral cystic changes are noted in the distal ulna and scaphoid.  A periarticular erosion is suggested at the distal first metacarpal.  IMPRESSION:  1.  This changes compatible with an inflammatory arthritis, suggesting rheumatoid arthritis or possibly early gout.   Original Report Authenticated By: Marin Roberts, M.D.     Medications: Scheduled Meds:    . allopurinol  100 mg Oral Daily  . [COMPLETED] bisacodyl  10 mg Rectal Once  . diltiazem  360 mg Oral Daily  . docusate sodium  100 mg Oral BID  . exemestane  25 mg Oral QPC breakfast  . feeding supplement  237 mL Oral Q24H  . insulin aspart  0-20 Units Subcutaneous TID WC  . insulin aspart  0-5 Units Subcutaneous QHS  . [COMPLETED] insulin aspart  20 Units Subcutaneous Once  . insulin aspart  4 Units Subcutaneous TID WC  . [COMPLETED] insulin aspart  6 Units Subcutaneous Once  . [COMPLETED] insulin aspart  6 Units Subcutaneous Once  . insulin glargine  35 Units Subcutaneous QHS  . metoprolol tartrate  25 mg Oral BID  . polyethylene glycol  17 g Oral Daily  . predniSONE  30 mg Oral Q breakfast  . simvastatin  40 mg Oral QHS  . theophylline  200  mg Oral BID  . warfarin  4 mg Oral ONCE-1800  . Warfarin - Pharmacist Dosing Inpatient   Does not apply q1800  . [DISCONTINUED] insulin glargine  30 Units Subcutaneous QHS  . [DISCONTINUED] predniSONE  40 mg Oral Q breakfast      LOS: 3 days   RAI,RIPUDEEP M.D. [facility] 11/19/2012, 4:08 PM Pager: [phone]  If 7PM-7AM, please contact night-coverage [email or web address] Password [email or web address]

## 2012-11-19 NOTE — Progress Notes (Signed)
Occupational Therapy Treatment Patient Details Name: [name] MRN: [id number] DOB: 16-Sep-1941 Today's Date: 11/19/2012 Time: 4098-1191 OT Time Calculation (min): 21 min  OT Assessment / Plan / Recommendation Comments on Treatment Session Pt reporting dizziness/nausea with mobility. No s/s of nystagmus noted with rapid head turns and chin thrust. Pt states she takes meclazine at home.    Follow Up Recommendations  SNF    Barriers to Discharge       Equipment Recommendations  3 in 1 bedside comode    Recommendations for Other Services    Frequency Min 2X/week   Plan Discharge plan remains appropriate    Precautions / Restrictions Precautions Precautions: Fall   Pertinent Vitals/Pain Pt denied pain    ADL  Toilet Transfer: Simulated;+2 Total assistance Toilet Transfer: Patient Percentage: 60% Toilet Transfer Method: Stand pivot Toilet Transfer Equipment: Other (comment) (A'd pt back to bed.)    OT Diagnosis:    OT Problem List:   OT Treatment Interventions:     OT Goals ADL Goals ADL Goal: Toilet Transfer - Progress: Progressing toward goals  Visit Information  Last OT Received On: 11/19/12 Assistance Needed: +2 PT/OT Co-Evaluation/Treatment: Yes    Subjective Data  Subjective: I feel dizzy.   Prior Functioning       Cognition  Overall Cognitive Status: Appears within functional limits for tasks assessed/performed Arousal/Alertness: Awake/alert Orientation Level: Appears intact for tasks assessed Behavior During Session: [facility] for tasks performed    Mobility  Shoulder Instructions Bed Mobility Bed Mobility: Sit to Supine Sit to Supine: 3: Mod assist Details for Bed Mobility Assistance: Pt required physical A for both LEs. Transfers Sit to Stand: 1: +2 Total assist;From chair/3-in-1 Sit to Stand: Patient Percentage: 60% Stand to Sit: 1: +2 Total assist;To bed Stand to Sit: Patient Percentage: 60% Details for Transfer Assistance: Physical A needed  to rise, stabilize and control descent. Min cues for hand placement.       Exercises      Balance     End of Session OT - End of Session Activity Tolerance: Patient tolerated treatment well Patient left: in bed;with call bell/phone within reach  GO     Priscille Shadduck A OTR/L [phone] 11/19/2012, 3:44 PM

## 2012-11-19 NOTE — Care Management Note (Signed)
    Page 1 of 1   11/22/2012     11:31:26 AM   CARE MANAGEMENT NOTE 11/22/2012  Patient:  [name], [name]   Account Number:  [id number]  Date Initiated:  11/19/2012  Documentation initiated by:  Lanier Clam  Subjective/Objective Assessment:   ADMITTED W/ GOUT.ZO:[id number].     Action/Plan:   FROM HOME.HAS PCP,PHARMACY.   Anticipated DC Date:  11/22/2012   Anticipated DC Plan:  SKILLED NURSING FACILITY      DC Planning Services  CM consult      Choice offered to / List presented to:  C-1 Patient           Status of service:  Completed, signed off Medicare Important Message given?  NA - LOS <3 / Initial given by admissions (If response is "NO", the following Medicare IM given date fields will be blank) Date Medicare IM given:   Date Additional Medicare IM given:  11/22/2012  Discharge Disposition:  SKILLED NURSING FACILITY  Per UR Regulation:  Reviewed for med. necessity/level of care/duration of stay  If discussed at Long Length of Stay Meetings, dates discussed:    Comments:  11/22/12 Jahanna Raether RN,BSN NCM [phone] D/C SNF.  11/19/12 Chloey Ricard RN,BSN NCM [phone] PT-SNF.RECOMMEND HHRN/PT/SW.

## 2012-11-20 DIAGNOSIS — J441 Chronic obstructive pulmonary disease with (acute) exacerbation: Secondary | ICD-10-CM

## 2012-11-20 LAB — GLUCOSE, CAPILLARY
Glucose-Capillary: 333 mg/dL — ABNORMAL HIGH (ref 70–99)
Glucose-Capillary: 452 mg/dL — ABNORMAL HIGH (ref 70–99)
Glucose-Capillary: 412 mg/dL — ABNORMAL HIGH (ref 70–99)

## 2012-11-20 LAB — CBC
HCT: 34.6 % — ABNORMAL LOW (ref 36.0–46.0)
Hemoglobin: 11.7 g/dL — ABNORMAL LOW (ref 12.0–15.0)
MCH: 25.6 pg — ABNORMAL LOW (ref 26.0–34.0)
RBC: 4.57 MIL/uL (ref 3.87–5.11)

## 2012-11-20 LAB — BASIC METABOLIC PANEL
CO2: 27 mEq/L (ref 19–32)
Calcium: 8.6 mg/dL (ref 8.4–10.5)
Glucose, Bld: 384 mg/dL — ABNORMAL HIGH (ref 70–99)
Sodium: 132 mEq/L — ABNORMAL LOW (ref 135–145)

## 2012-11-20 LAB — PROTIME-INR: Prothrombin Time: 21.4 seconds — ABNORMAL HIGH (ref 11.6–15.2)

## 2012-11-20 MED ORDER — LEVALBUTEROL HCL 0.63 MG/3ML IN NEBU
0.6300 mg | INHALATION_SOLUTION | RESPIRATORY_TRACT | Status: DC | PRN
  Filled 2012-11-20: qty 3

## 2012-11-20 MED ORDER — INSULIN GLARGINE 100 UNIT/ML ~~LOC~~ SOLN
25.0000 [IU] | Freq: Two times a day (BID) | SUBCUTANEOUS | Status: DC
  Administered 2012-11-20 – 2012-11-21 (×3): 25 [IU] via SUBCUTANEOUS

## 2012-11-20 MED ORDER — INSULIN ASPART 100 UNIT/ML ~~LOC~~ SOLN
10.0000 [IU] | Freq: Once | SUBCUTANEOUS | Status: AC
  Administered 2012-11-20: 10 [IU] via SUBCUTANEOUS

## 2012-11-20 MED ORDER — INSULIN ASPART 100 UNIT/ML ~~LOC~~ SOLN
20.0000 [IU] | Freq: Once | SUBCUTANEOUS | Status: AC
  Administered 2012-11-20: 20 [IU] via SUBCUTANEOUS

## 2012-11-20 MED ORDER — IPRATROPIUM BROMIDE 0.02 % IN SOLN
0.5000 mg | Freq: Four times a day (QID) | RESPIRATORY_TRACT | Status: DC
  Administered 2012-11-20 – 2012-11-21 (×5): 0.5 mg via RESPIRATORY_TRACT
  Filled 2012-11-20 (×6): qty 2.5

## 2012-11-20 MED ORDER — WARFARIN SODIUM 6 MG PO TABS
6.0000 mg | ORAL_TABLET | Freq: Once | ORAL | Status: AC
  Administered 2012-11-20: 6 mg via ORAL
  Filled 2012-11-20: qty 1

## 2012-11-20 MED ORDER — LEVALBUTEROL HCL 0.63 MG/3ML IN NEBU
0.6300 mg | INHALATION_SOLUTION | Freq: Four times a day (QID) | RESPIRATORY_TRACT | Status: DC
  Administered 2012-11-20 – 2012-11-21 (×4): 0.63 mg via RESPIRATORY_TRACT
  Filled 2012-11-20 (×9): qty 3

## 2012-11-20 MED ORDER — BUDESONIDE 0.25 MG/2ML IN SUSP
0.2500 mg | Freq: Two times a day (BID) | RESPIRATORY_TRACT | Status: DC
  Administered 2012-11-20 – 2012-11-22 (×4): 0.25 mg via RESPIRATORY_TRACT
  Filled 2012-11-20 (×7): qty 2

## 2012-11-20 MED ORDER — COLCHICINE 0.6 MG PO TABS
0.6000 mg | ORAL_TABLET | Freq: Two times a day (BID) | ORAL | Status: DC
  Administered 2012-11-20 – 2012-11-22 (×5): 0.6 mg via ORAL
  Filled 2012-11-20 (×6): qty 1

## 2012-11-20 NOTE — Progress Notes (Signed)
ANTICOAGULATION CONSULT NOTE - Follow Up Consult  Pharmacy Consult for Coumadin Indication: atrial fibrillation  Allergies  Allergen Reactions  . Pravachol Other (See Comments)    Muscle pain  . Pravastatin Sodium    Patient Measurements: Height: 5' (152.4 cm) Weight: 280 lb 3.3 oz (127.1 kg) IBW/kg (Calculated) : 45.5   Vital Signs: Temp: 98.3 F (36.8 C) (12/10 0618) Temp src: Oral (12/10 0618) BP: 125/67 mmHg (12/10 0618) Pulse Rate: 86  (12/10 0618)  Labs:  Basename 11/20/12 0515 11/19/12 0508 11/18/12 0545  HGB 11.7* 12.0 --  HCT 34.6* 35.3* 36.1  PLT 225 236 212  APTT -- -- --  LABPROT 21.4* 26.2* 53.1*  INR 1.94* 2.55* 6.61*  HEPARINUNFRC -- -- --  CREATININE 0.90 1.46* 2.30*  CKTOTAL -- -- --  CKMB -- -- --  TROPONINI -- -- --   Estimated Creatinine Clearance: 68.6 ml/min (by C-G formula based on Cr of 0.9).  Medications:  Scheduled:     . allopurinol  100 mg Oral Daily  . diltiazem  360 mg Oral Daily  . docusate sodium  100 mg Oral BID  . exemestane  25 mg Oral QPC breakfast  . feeding supplement  237 mL Oral Q24H  . insulin aspart  0-20 Units Subcutaneous TID WC  . insulin aspart  0-5 Units Subcutaneous QHS  . [COMPLETED] insulin aspart  10 Units Subcutaneous Once  . insulin aspart  8 Units Subcutaneous TID WC  . insulin glargine  25 Units Subcutaneous BID  . metoprolol tartrate  25 mg Oral BID  . polyethylene glycol  17 g Oral Daily  . predniSONE  30 mg Oral Q breakfast  . simvastatin  40 mg Oral QHS  . theophylline  200 mg Oral BID  . torsemide  20 mg Oral Daily  . [COMPLETED] warfarin  4 mg Oral ONCE-1800  . Warfarin - Pharmacist Dosing Inpatient   Does not apply q1800  . [DISCONTINUED] insulin aspart  4 Units Subcutaneous TID WC  . [DISCONTINUED] insulin aspart  6 Units Subcutaneous TID WC  . [DISCONTINUED] insulin glargine  30 Units Subcutaneous QHS  . [DISCONTINUED] insulin glargine  35 Units Subcutaneous QHS  . [DISCONTINUED] insulin  glargine  35 Units Subcutaneous QHS  . [DISCONTINUED] predniSONE  40 mg Oral Q breakfast   Assessment:  [name] with h/o atrial fibrillation on chronic Coumadin.  Admitted 12/6 for Afib with RVR.  Patient was reportedly on Coumadin 6 mg on MWF and 4 mg other days.  INR now slightly below therapeutic range after Vit K 5mg  sq 12/7, Warfarin resumed yesterday with 4mg   CBC stable, no bleeding reported, tolerating diet well, CBG's continue elevated despite increased insulin.Prednisone contributing. (Ensure per patient request has been discontinued)  Giving Vit K SQ can confer resistance when Warfarin resumed.  Goal of Therapy:  INR 2-3   Plan:   Warfarin 6mg  today  Daily PT/INR  Otho Bellows PharmD Pager: [phone] 11/20/2012,9:01 AM

## 2012-11-20 NOTE — Progress Notes (Signed)
Patient ID: [name]  female  WUJ:[id number]    DOB: 09-Feb-1941    DOA: 11/16/2012  PCP: [name], MD  Assessment/Plan: Principal Problem: COPD exacerbation: - Patient is wheezing today, placed on scheduled to Xopenex and Atrovent nebs, pulmicort, she is already on oral steroids for ac gout attack - CXR negative for any PNA   Gout attack: Due to acute renal insufficiency, I was unable to give colchicine or NSAIDs earlier for the pain - Continue prednisone, and allopurinol - serum uric acid 11.2, RA factor slightly elevated at 18, CCP antibodies are within normal limits hence acute gout attack confirmed - I added colchicine BID today, taper to daily or PRN when acute attack resolved  Active Problems:  Afib with RVR: Right now controlled, INR was supratherapeutic on adm - Continue beta blocker and Cardizem, INR 1.99 - Restarted Coumadin per pharmacy   Breast cancer, stage 1: cont aromasin   Diabetes mellitus: Blood sugars uncontrolled due to prednisone - Increased Lantus to BID dosing, cont meal coverage, resistant sliding scale   Hyponatremia: Psuedo-hyponatremia, continue current medication   Acute renal failure: resolved - Cont torsemide daily  - No IV fluids   DVT Prophylaxis: On Coumadin  Code Status: full code  Disposition: PT evaluation recommending skilled nursing facility, patient interested in [facility], social worker updated  Subjective: States that feeling short of breath today, wheezing. No chest pain, nausea, vomiting, abdominal pain  Bilateral wrist pain improving, swelling improving   Objective: Weight change:   Intake/Output Summary (Last 24 hours) at 11/20/12 1120 Last data filed at 11/20/12 0655  Gross per 24 hour  Intake 238.33 ml  Output   2950 ml  Net -2711.67 ml   Blood pressure 125/67, pulse 85, temperature 98.3 F (36.8 C), temperature source Oral, resp. rate 18, height 5' (1.524 m), weight 127.1 kg (280 lb 3.3 oz), SpO2  96.00%.  Physical Exam: General: Alert and awake, oriented x3, not in any acute distress. HEENT: anicteric sclera, PERLA, EOMI CVS: S1-S2 clear, no murmur rubs or gallops Chest: Bilateral expiratory wheezing Abdomen: soft nontender, nondistended, normal bowel sounds, no organomegaly Extremities: no cyanosis, clubbing or edema noted bilaterally. Both wrists swollen but significantly improved from adm Neuro: Nonfocal  Lab Results: Basic Metabolic Panel:  Lab 11/20/12 9562 11/19/12 0508 11/18/12 0545  NA 132* 129* --  K 4.0 3.7 --  CL 96 93* --  CO2 27 21 --  GLUCOSE 384* 461* --  BUN 37* 65* --  CREATININE 0.90 1.46* --  CALCIUM 8.6 8.6 --  MG -- -- 1.5  PHOS -- -- --   Liver Function Tests:  Lab 11/17/12 0540  AST 9  ALT 7  ALKPHOS 75  BILITOT 0.8  PROT 7.1  ALBUMIN 2.6*   No results found for this basename: LIPASE:2,AMYLASE:2 in the last 168 hours No results found for this basename: AMMONIA:2 in the last 168 hours CBC:  Lab 11/20/12 0515 11/19/12 0508 11/16/12 1545  WBC 5.7 7.5 --  NEUTROABS -- -- 3.8  HGB 11.7* 12.0 --  HCT 34.6* 35.3* --  MCV 75.7* 75.6* --  PLT 225 236 --   Cardiac Enzymes: No results found for this basename: CKTOTAL:3,CKMB:3,CKMBINDEX:3,TROPONINI:3 in the last 168 hours BNP: No components found with this basename: POCBNP:2 CBG:  Lab 11/20/12 0736 11/19/12 2157 11/19/12 1711 11/19/12 1228 11/19/12 0752  GLUCAP 337* 468* 464* 464* 430*     Micro Results: No results found for this or any previous visit (from  the past 240 hour(s)).  Studies/Results: Dg Chest 2 View  11/16/2012  *RADIOLOGY REPORT*  Clinical Data: Dyspnea, shortness of breath  CHEST - 2 VIEW  Comparison: 12/22/2011  Findings: Cardiomegaly is noted.  Study is markedly limited by patient's large body habitus and poor inspiration.  Lung bases are obscured by overlying abdominal and chest wall tissue.  Mild interstitial prominence bilaterally.  Mild interstitial edema cannot  be excluded.  No gross segmental infiltrate.  IMPRESSION: Markedly limited study as described above.  Cardiomegaly is noted. Mild interstitial prominence bilaterally.  Mild interstitial edema cannot be excluded.  No gross segmental infiltrate.   Original Report Authenticated By: Natasha Mead, M.D.    Dg Wrist Complete Left  11/16/2012  *RADIOLOGY REPORT*  Clinical Data: Bilateral wrist pain.  Question gout.  LEFT WRIST - COMPLETE 3+ VIEW  Comparison: None.  Findings: Mild diffuse osteopenia is present.  Soft tissue swelling is present about the wrist.  There are no erosions.  Joint spaces are preserved.  IMPRESSION: Osteopenia and diffuse soft tissue swelling.  This could represent an inflammatory arthritis although no erosions are evident.   Original Report Authenticated By: Marin Roberts, M.D.    Dg Wrist Complete Right  11/16/2012  *RADIOLOGY REPORT*  Clinical Data: Bilateral wrist pain.  Question gout.  RIGHT WRIST - COMPLETE 3+ VIEW  Comparison: Left wrist films of the same day.  Findings: Mild osteopenia is present.  There is some loss of joint space at the [facility] joints.  Joint spaces are otherwise preserved. Subchondral cystic changes are noted in the distal ulna and scaphoid.  A periarticular erosion is suggested at the distal first metacarpal.  IMPRESSION:  1.  This changes compatible with an inflammatory arthritis, suggesting rheumatoid arthritis or possibly early gout.   Original Report Authenticated By: Marin Roberts, M.D.     Medications: Scheduled Meds:    . allopurinol  100 mg Oral Daily  . diltiazem  360 mg Oral Daily  . docusate sodium  100 mg Oral BID  . exemestane  25 mg Oral QPC breakfast  . feeding supplement  237 mL Oral Q24H  . insulin aspart  0-20 Units Subcutaneous TID WC  . insulin aspart  0-5 Units Subcutaneous QHS  . [COMPLETED] insulin aspart  10 Units Subcutaneous Once  . insulin aspart  8 Units Subcutaneous TID WC  . insulin glargine  25 Units Subcutaneous  BID  . levalbuterol  0.63 mg Nebulization Q6H   And  . ipratropium  0.5 mg Nebulization Q6H  . metoprolol tartrate  25 mg Oral BID  . polyethylene glycol  17 g Oral Daily  . predniSONE  30 mg Oral Q breakfast  . simvastatin  40 mg Oral QHS  . theophylline  200 mg Oral BID  . torsemide  20 mg Oral Daily  . [COMPLETED] warfarin  4 mg Oral ONCE-1800  . warfarin  6 mg Oral Once  . Warfarin - Pharmacist Dosing Inpatient   Does not apply q1800  . [DISCONTINUED] insulin aspart  4 Units Subcutaneous TID WC  . [DISCONTINUED] insulin aspart  6 Units Subcutaneous TID WC  . [DISCONTINUED] insulin glargine  30 Units Subcutaneous QHS  . [DISCONTINUED] insulin glargine  35 Units Subcutaneous QHS  . [DISCONTINUED] insulin glargine  35 Units Subcutaneous QHS  . [DISCONTINUED] predniSONE  40 mg Oral Q breakfast      LOS: 4 days   Mintie Witherington M.D. [facility] 11/20/2012, 11:20 AM Pager: [phone]  If 7PM-7AM, please contact  night-coverage [email or web address] Password [email or web address]

## 2012-11-20 NOTE — Clinical Documentation Improvement (Signed)
BMI DOCUMENTATION CLARIFICATION QUERY  THIS DOCUMENT IS NOT A PERMANENT PART OF THE MEDICAL RECORD  TO RESPOND TO THE THIS QUERY, FOLLOW THE INSTRUCTIONS BELOW:  1. If needed, update documentation for the patient's encounter via the notes activity.  2. Access this query again and click edit on the In [employer].  3. After updating, or not, click F2 to complete all highlighted (required) fields concerning your review. Select "additional documentation in the medical record" OR "no additional documentation provided".  4. Click Sign note button.  5. The deficiency will fall out of your In Basket *Please let us know if you are not able to complete this workflow by phone or e-mail (listed below).         11/20/12  Dear Dr. Rod Can Marton Redwood  In an effort to better capture your patient's severity of illness, reflect appropriate length of stay and utilization of resources, a review of the patient medical record has revealed the following indicators.    Based on your clinical judgment, please clarify and document in a progress note and/or discharge summary the clinical condition associated with the following supporting information:  In responding to this query please exercise your independent judgment.  The fact that a query is asked, does not imply that any particular answer is desired or expected.  Per nutrition evaluation on 12/8  patient's BMI is 54.72  and meets criteria for ( morbid obesity, extreme obesity) if either term is appropriate please document in the progress notes.  Thank you   Possible Clinical conditions:  Obesity ( range 30.0-39.9)  Morbid Obesity (range 40.0-49.9)  Extreme Obesity (ranged 50.0- 59.9)  Other condition  Cannot Clinically determine   Risk Factors: DM, Asthma, Gout, Hernia, OBS, Breast Ca stge 1  Weight: 280 lb 3.3 oz (127.1 kg) Height: 5' (152.4 cm)  BMI= 54.72      Nursing note: CHO MOD diet prior to admit      Reviewed:  {Reviewed:, will addend the progress note or DC summary Thank You,  Leonette Most Addison  Clinical Documentation Specialist, BSN: Pager [phone]  HIM off # [phone] (M-F 8a-430p)  Health Information Management [place]

## 2012-11-20 NOTE — Progress Notes (Signed)
Dr.Rai notified of CBG of 507 orders given to give a total of 20 units Novolog SQ and recheck cbg. Pt asymptomatic. Will continue to monitor.

## 2012-11-20 NOTE — Progress Notes (Signed)
CSW met with patient. Patient is alert and oriented X3. Patient is in need of snf placement. Patient requests [facility] but is agreeable to being faxed out. CSW has no access to [facility] at this time. Psychosocial assessment completed and placed in chart.  [name] MSW, LCSW [phone]

## 2012-11-21 ENCOUNTER — Encounter (HOSPITAL_COMMUNITY): Payer: Self-pay | Admitting: *Deleted

## 2012-11-21 LAB — GLUCOSE, CAPILLARY: Glucose-Capillary: 472 mg/dL — ABNORMAL HIGH (ref 70–99)

## 2012-11-21 MED ORDER — INSULIN GLARGINE 100 UNIT/ML ~~LOC~~ SOLN
30.0000 [IU] | Freq: Two times a day (BID) | SUBCUTANEOUS | Status: DC
  Administered 2012-11-21 – 2012-11-22 (×2): 30 [IU] via SUBCUTANEOUS

## 2012-11-21 MED ORDER — INSULIN ASPART 100 UNIT/ML ~~LOC~~ SOLN
10.0000 [IU] | Freq: Three times a day (TID) | SUBCUTANEOUS | Status: DC
  Administered 2012-11-22 (×2): 10 [IU] via SUBCUTANEOUS

## 2012-11-21 MED ORDER — WARFARIN SODIUM 6 MG PO TABS
6.0000 mg | ORAL_TABLET | Freq: Once | ORAL | Status: AC
  Administered 2012-11-21: 6 mg via ORAL
  Filled 2012-11-21: qty 1

## 2012-11-21 MED ORDER — FLUCONAZOLE 100 MG PO TABS
100.0000 mg | ORAL_TABLET | Freq: Every day | ORAL | Status: DC
  Administered 2012-11-21: 100 mg via ORAL
  Filled 2012-11-21: qty 1

## 2012-11-21 MED ORDER — NYSTATIN 100000 UNIT/ML MT SUSP
5.0000 mL | Freq: Four times a day (QID) | OROMUCOSAL | Status: DC
  Administered 2012-11-21 – 2012-11-22 (×2): 500000 [IU] via ORAL
  Filled 2012-11-21 (×5): qty 5

## 2012-11-21 MED ORDER — INSULIN ASPART 100 UNIT/ML ~~LOC~~ SOLN
10.0000 [IU] | Freq: Once | SUBCUTANEOUS | Status: AC
  Administered 2012-11-21: 10 [IU] via SUBCUTANEOUS

## 2012-11-21 NOTE — Progress Notes (Signed)
Physical Therapy Treatment Patient Details Name: [name] MRN: [id number] DOB: 04/11/1941 Today's Date: 11/21/2012 Time: 4098-1191 PT Time Calculation (min): 21 min  PT Assessment / Plan / Recommendation Comments on Treatment Session  Pt still with some c/o dizziness when sitting EOB, however none mentioned when up during transfers.      Follow Up Recommendations  SNF     Does the patient have the potential to tolerate intense rehabilitation     Barriers to Discharge        Equipment Recommendations  None recommended by PT    Recommendations for Other Services    Frequency Min 3X/week   Plan Discharge plan remains appropriate;Frequency remains appropriate    Precautions / Restrictions Precautions Precautions: Fall Precaution Comments: gout pain Restrictions Weight Bearing Restrictions: No   Pertinent Vitals/Pain Pt c/o genital pain from catheter.  RN took out foley during session.     Mobility  Bed Mobility Bed Mobility: Supine to Sit Supine to Sit: 4: Min assist;3: Mod assist;HOB elevated;With rails Details for Bed Mobility Assistance: Some assist for hips via bed pad and some slight assist for trunk.  Transfers Transfers: Sit to Stand;Stand to Sit;Stand Pivot Transfers Sit to Stand: 1: +2 Total assist;From chair/3-in-1;From bed;From elevated surface Sit to Stand: Patient Percentage: 60% Stand to Sit: 1: +2 Total assist;To chair/3-in-1;With armrests Stand to Sit: Patient Percentage: 60% Stand Pivot Transfers: 1: +2 Total assist Stand Pivot Transfers: Patient Percentage: 60% Details for Transfer Assistance: Assist to rise and stabalize with cues for quad activation to prevent knee buckling, hand placement, controlled descent and sequencing/technqiue during transfer.  Ambulation/Gait Assistive device: Rolling walker    Exercises     PT Diagnosis:    PT Problem List:   PT Treatment Interventions:     PT Goals Acute Rehab PT Goals PT Goal Formulation:  With patient Time For Goal Achievement: 12/03/12 Potential to Achieve Goals: Good Pt will go Supine/Side to Sit: with modified independence PT Goal: Supine/Side to Sit - Progress: Progressing toward goal Pt will go Sit to Stand: with supervision PT Goal: Sit to Stand - Progress: Progressing toward goal Pt will go Stand to Sit: with supervision PT Goal: Stand to Sit - Progress: Progressing toward goal Pt will Ambulate: 16 - 50 feet;with supervision;with rolling walker PT Goal: Ambulate - Progress: Progressing toward goal  Visit Information  Last PT Received On: 11/21/12 Assistance Needed: +2    Subjective Data  Subjective: I will try to get up to use the bedside commode.  Patient Stated Goal: to go to rehab again.   Cognition  Overall Cognitive Status: Appears within functional limits for tasks assessed/performed Arousal/Alertness: Awake/alert Orientation Level: Appears intact for tasks assessed Behavior During Session: [facility] for tasks performed    Balance  Balance Balance Assessed: Yes Static Standing Balance Static Standing - Balance Support: Bilateral upper extremity supported Static Standing - Level of Assistance: 4: Min assist;3: Mod assist Static Standing - Comment/# of Minutes: Pt able to stand with min/mod assist while PT assisted with self care activity.   End of Session PT - End of Session Activity Tolerance: Patient limited by fatigue Patient left: in chair;with call bell/phone within reach Nurse Communication: Mobility status   GP     Page, Meribeth Mattes 11/21/2012, 6:00 PM

## 2012-11-21 NOTE — Progress Notes (Signed)
Pt has cbg of 494. Midlevel paged awaiting call back.

## 2012-11-21 NOTE — Progress Notes (Signed)
Called midlevel d/t pt with cbg of 494 awaiting call back.

## 2012-11-21 NOTE — Progress Notes (Signed)
Inpatient Diabetes Program Recommendations  AACE/ADA: New Consensus Statement on Inpatient Glycemic Control (2013)  Target Ranges:  Prepandial:   less than 140 mg/dL      Peak postprandial:   less than 180 mg/dL (1-2 hours)      Critically ill patients:  140 - 180 mg/dL   Reason for Visit: Results for [name], [name] (MRN [id number]) as of 11/21/2012 12:46  Ref. Range 11/20/2012 17:41 11/20/2012 20:36 11/20/2012 22:08 11/21/2012 07:19 11/21/2012 11:53  Glucose-Capillary Latest Range: 70-99 mg/dL 409 (H) 811 (H) 914 (H) 296 (H) 321 (H)   CBG's remain to be high, especially in the evenings.  Patient is on PO prednisone which is likely contributing to resistance. Please increase Novolog meal coverage to 10 units tid with meals.  If CBG increases again to greater than 400 mg/dL, may consider IV insulin to help with insulin resistance.  Will continue to follow.

## 2012-11-21 NOTE — Progress Notes (Signed)
[facility] PROGRESS NOTE  [name]:[id number] DOB: [birth year] DOA: 11/16/2012 PCP: [name], MD  Assessment/Plan: 1-COPD exacerbation:  Continue with Nebulizer, prednisone.  - CXR negative for any PNA  2-Gout attack: - Continue prednisone, and Colchicine.  - serum uric acid 11.2, RA factor slightly elevated at 18, CCP antibodies are within normal limits hence acute gout attack confirmed   3-Afib with RVR: Right now controlled, INR was supratherapeutic on adm  - Continue beta blocker and Cardizem,   -  Coumadin per pharmacy  Breast cancer, stage 1: cont aromasin  Diabetes mellitus: Blood sugars uncontrolled due to prednisone  - Increased Lantus to BID dosing, increase meal coverage, resistant sliding scale  Hyponatremia: Psuedo-hyponatremia, continue current medication  Acute renal failure: resolved  - Cont torsemide daily. B-met am.  DVT Prophylaxis    Family Communication: none at bedside, Disposition Plan: Need SNF, dc in 1 or 2 days.   Consultants:  none  Procedures:  none  Antibiotics:  none  HPI/Subjective: Breathing better not at baseline. Legs pain improved.   Objective: Filed Vitals:   11/21/12 1200 11/21/12 1201 11/21/12 1457 11/21/12 1504  BP: 134/98  147/79   Pulse:  84 95   Temp:   97.8 F (36.6 C)   TempSrc:   Oral   Resp:   16   Height:      Weight:      SpO2:   91% 95%    Intake/Output Summary (Last 24 hours) at 11/21/12 2103 Last data filed at 11/21/12 2000  Gross per 24 hour  Intake 1743.33 ml  Output   3150 ml  Net -1406.67 ml   Filed Weights   11/16/12 2055  Weight: 127.1 kg (280 lb 3.3 oz)    Exam:   General:  No distress  Cardiovascular: S 1, S 2 RRR  Respiratory:no wheezes.  Abdomen: BS presents, soft, NT  Data Reviewed: Basic Metabolic Panel:  Lab 11/20/12 8119 11/19/12 0508 11/18/12 0545 11/17/12 1845 11/17/12 0540 11/17/12 0445 11/16/12 1554  NA 132* 129* 126* -- 131* -- 131*  K  4.0 3.7 4.1 -- 4.1 -- 3.3*  CL 96 93* 89* -- 92* -- 96  CO2 27 21 22  -- 25 -- --  GLUCOSE 384* 461* 386* 458* 250* -- --  BUN 37* 65* 68* -- 45* -- 39*  CREATININE 0.90 1.46* 2.30* -- 1.60* -- 1.40*  CALCIUM 8.6 8.6 8.4 -- 8.7 -- --  MG -- -- 1.5 -- -- 1.1* --  PHOS -- -- -- -- -- -- --   Liver Function Tests:  Lab 11/17/12 0540  AST 9  ALT 7  ALKPHOS 75  BILITOT 0.8  PROT 7.1  ALBUMIN 2.6*   No results found for this basename: LIPASE:5,AMYLASE:5 in the last 168 hours No results found for this basename: AMMONIA:5 in the last 168 hours CBC:  Lab 11/20/12 0515 11/19/12 0508 11/18/12 0545 11/17/12 0540 11/16/12 1554 11/16/12 1545  WBC 5.7 7.5 5.5 6.7 -- 5.4  NEUTROABS -- -- -- -- -- 3.8  HGB 11.7* 12.0 12.0 13.1 13.6 --  HCT 34.6* 35.3* 36.1 38.2 40.0 --  MCV 75.7* 75.6* 75.5* 76.6* -- 74.9*  PLT 225 236 212 187 -- 190   Cardiac Enzymes: No results found for this basename: CKTOTAL:5,CKMB:5,CKMBINDEX:5,TROPONINI:5 in the last 168 hours BNP (last 3 results) No results found for this basename: PROBNP:3 in the last 8760 hours CBG:  Lab 11/21/12 1726 11/21/12 1153 11/21/12 0719 11/20/12 2208 11/20/12 2036  GLUCAP 472* 321* 296* 412* 452*    No results found for this or any previous visit (from the past 240 hour(s)).   Studies: No results found.  Scheduled Meds:   . allopurinol  100 mg Oral Daily  . budesonide  0.25 mg Nebulization BID  . colchicine  0.6 mg Oral BID  . diltiazem  360 mg Oral Daily  . docusate sodium  100 mg Oral BID  . exemestane  25 mg Oral QPC breakfast  . feeding supplement  237 mL Oral Q24H  . fluconazole  100 mg Oral Daily  . insulin aspart  0-20 Units Subcutaneous TID WC  . insulin aspart  0-5 Units Subcutaneous QHS  . [COMPLETED] insulin aspart  10 Units Subcutaneous Once  . insulin aspart  10 Units Subcutaneous TID WC  . insulin glargine  30 Units Subcutaneous BID  . levalbuterol  0.63 mg Nebulization Q6H   And  . ipratropium  0.5 mg  Nebulization Q6H  . metoprolol tartrate  25 mg Oral BID  . polyethylene glycol  17 g Oral Daily  . predniSONE  30 mg Oral Q breakfast  . simvastatin  40 mg Oral QHS  . theophylline  200 mg Oral BID  . torsemide  20 mg Oral Daily  . [COMPLETED] warfarin  6 mg Oral ONCE-1800  . Warfarin - Pharmacist Dosing Inpatient   Does not apply q1800  . [DISCONTINUED] insulin aspart  8 Units Subcutaneous TID WC  . [DISCONTINUED] insulin glargine  25 Units Subcutaneous BID   Continuous Infusions:   . sodium chloride 20 mL/hr (11/19/12 1950)    Principal Problem:  *COPD exacerbation Active Problems:  Afib with RVR  Respiratory failure with hypoxia  Breast cancer, stage 1  Gout attack  Diabetes mellitus  Acute renal failure    Time spent: 25 minutes.    Redith Drach  [facility] Pager [phone]. If 8PM-8AM, please contact night-coverage at [email or web address], password [facility] 11/21/2012, 9:03 PM  LOS: 5 days

## 2012-11-21 NOTE — Progress Notes (Signed)
ANTICOAGULATION CONSULT NOTE - Follow Up Consult  Pharmacy Consult for Warfarin  Indication: atrial fibrillation  Allergies  Allergen Reactions  . Pravachol Other (See Comments)    Muscle pain  . Pravastatin Sodium    Patient Measurements: Height: 5' (152.4 cm) Weight: 280 lb 3.3 oz (127.1 kg) IBW/kg (Calculated) : 45.5   Vital Signs: Temp: 97.3 F (36.3 C) (12/11 0622) Temp src: Oral (12/11 0622) BP: 135/80 mmHg (12/11 0622) Pulse Rate: 70  (12/11 0622)  Labs:  Basename 11/21/12 0503 11/20/12 0515 11/19/12 0508  HGB -- 11.7* 12.0  HCT -- 34.6* 35.3*  PLT -- 225 236  APTT -- -- --  LABPROT 21.5* 21.4* 26.2*  INR 1.95* 1.94* 2.55*  HEPARINUNFRC -- -- --  CREATININE -- 0.90 1.46*  CKTOTAL -- -- --  CKMB -- -- --  TROPONINI -- -- --   Estimated Creatinine Clearance: 68.6 ml/min (by C-G formula based on Cr of 0.9).  Medications:  Scheduled:     . allopurinol  100 mg Oral Daily  . budesonide  0.25 mg Nebulization BID  . colchicine  0.6 mg Oral BID  . diltiazem  360 mg Oral Daily  . docusate sodium  100 mg Oral BID  . exemestane  25 mg Oral QPC breakfast  . feeding supplement  237 mL Oral Q24H  . insulin aspart  0-20 Units Subcutaneous TID WC  . insulin aspart  0-5 Units Subcutaneous QHS  . [COMPLETED] insulin aspart  10 Units Subcutaneous Once  . [COMPLETED] insulin aspart  20 Units Subcutaneous Once  . insulin aspart  8 Units Subcutaneous TID WC  . insulin glargine  25 Units Subcutaneous BID  . levalbuterol  0.63 mg Nebulization Q6H   And  . ipratropium  0.5 mg Nebulization Q6H  . metoprolol tartrate  25 mg Oral BID  . polyethylene glycol  17 g Oral Daily  . predniSONE  30 mg Oral Q breakfast  . simvastatin  40 mg Oral QHS  . theophylline  200 mg Oral BID  . torsemide  20 mg Oral Daily  . [COMPLETED] warfarin  6 mg Oral Once  . Warfarin - Pharmacist Dosing Inpatient   Does not apply q1800   Anticoagulation therapy: 12/7 Vit K 5mg  sq 12/9 warfarin  4mg , 12/10 warfarin 6mg   Assessment:  19 yof with h/o atrial fibrillation on chronic Coumadin.  Admitted 12/6 for Afib with RVR and supratherapeutic INR.  Patient was reportedly on Coumadin 6 mg on MWF and 4 mg other days.  INR slightly subtherapeutic (1.95) today.  SQ vitamin K may confer warfarin resistance when resumed.  Last CBC stable, no complications reported.   Goal of Therapy:  INR 2-3   Plan:   Warfarin 6mg  today  Daily PT/INR  Lynann Beaver PharmD, BCPS Pager [phone] 11/21/2012 11:06 AM

## 2012-11-21 NOTE — Progress Notes (Signed)
Spoke with MD about patients hyperglycemia event. Blood sugar was 472 at 1726.  MD wants sliding scale of 20 units of novolog to be given now. Will continue to monitor patient. Setzer, Don Broach

## 2012-11-22 LAB — BASIC METABOLIC PANEL
GFR calc Af Amer: >90 mL/min (ref >90)
GFR calc non Af Amer: 87 mL/min — ABNORMAL LOW (ref >90)
Potassium: 3.9 mEq/L (ref 3.5–5.1)
Sodium: 139 mEq/L (ref 135–145)

## 2012-11-22 LAB — GLUCOSE, CAPILLARY
Glucose-Capillary: 242 mg/dL — ABNORMAL HIGH (ref 70–99)
Glucose-Capillary: 164 mg/dL — ABNORMAL HIGH (ref 70–99)

## 2012-11-22 MED ORDER — COLCHICINE 0.6 MG PO TABS: 0.6000 mg | ORAL_TABLET | Freq: Two times a day (BID) | ORAL | Status: DC

## 2012-11-22 MED ORDER — INSULIN GLARGINE 100 UNIT/ML ~~LOC~~ SOLN: 30.0000 [IU] | Freq: Every day | SUBCUTANEOUS | Status: DC

## 2012-11-22 MED ORDER — LEVALBUTEROL HCL 0.63 MG/3ML IN NEBU
0.6300 mg | INHALATION_SOLUTION | Freq: Four times a day (QID) | RESPIRATORY_TRACT | Status: DC
  Administered 2012-11-22: 0.63 mg via RESPIRATORY_TRACT
  Filled 2012-11-22 (×5): qty 3

## 2012-11-22 MED ORDER — BIOTENE DRY MOUTH MT LIQD: 15.0000 mL | OROMUCOSAL | Status: DC | PRN

## 2012-11-22 MED ORDER — LEVALBUTEROL HCL 0.63 MG/3ML IN NEBU
0.6300 mg | INHALATION_SOLUTION | RESPIRATORY_TRACT | Status: DC | PRN
  Filled 2012-11-22: qty 3

## 2012-11-22 MED ORDER — IPRATROPIUM BROMIDE 0.02 % IN SOLN
0.5000 mg | Freq: Four times a day (QID) | RESPIRATORY_TRACT | Status: DC
  Administered 2012-11-22: 0.5 mg via RESPIRATORY_TRACT
  Filled 2012-11-22: qty 2.5

## 2012-11-22 MED ORDER — INSULIN ASPART 100 UNIT/ML ~~LOC~~ SOLN: 10.0000 [IU] | Freq: Three times a day (TID) | SUBCUTANEOUS | Status: DC

## 2012-11-22 MED ORDER — POLYETHYLENE GLYCOL 3350 17 G PO PACK: 17.0000 g | PACK | Freq: Every day | ORAL | Status: DC

## 2012-11-22 MED ORDER — NYSTATIN 100000 UNIT/ML MT SUSP: 5.0000 mL | Freq: Four times a day (QID) | OROMUCOSAL | Status: DC

## 2012-11-22 MED ORDER — HYDROCODONE-ACETAMINOPHEN 5-325 MG PO TABS: 1.0000 | ORAL_TABLET | Freq: Four times a day (QID) | ORAL | Status: DC | PRN

## 2012-11-22 MED ORDER — IPRATROPIUM BROMIDE 0.02 % IN SOLN: 0.5000 mg | Freq: Four times a day (QID) | RESPIRATORY_TRACT | Status: DC

## 2012-11-22 MED ORDER — PREDNISONE 10 MG PO TABS: ORAL_TABLET | ORAL | Status: DC

## 2012-11-22 MED ORDER — BUDESONIDE 0.25 MG/2ML IN SUSP: 0.2500 mg | Freq: Two times a day (BID) | RESPIRATORY_TRACT | Status: DC

## 2012-11-22 MED ORDER — LEVALBUTEROL HCL 0.63 MG/3ML IN NEBU: 0.6300 mg | INHALATION_SOLUTION | Freq: Four times a day (QID) | RESPIRATORY_TRACT | Status: DC

## 2012-11-22 NOTE — Progress Notes (Signed)
Clinical Social Work Department CLINICAL SOCIAL WORK PLACEMENT NOTE 11/22/2012  Patient:  [name], [name]  Account Number:  [id number] Admit date:  11/16/2012  Clinical Social Worker:  Becky Sax, LCSW  Date/time:  11/22/2012 12:00 M  Clinical Social Work is seeking post-discharge placement for this patient at the following level of care:   SKILLED NURSING   (*CSW will update this form in Epic as items are completed)   11/22/2012  Patient/family provided with [facility] Department of Clinical Social Work's list of facilities offering this level of care within the geographic area requested by the patient (or if unable, by the patient's family).  11/22/2012  Patient/family informed of their freedom to choose among providers that offer the needed level of care, that participate in Medicare, Medicaid or managed care program needed by the patient, have an available bed and are willing to accept the patient.  11/22/2012  Patient/family informed of [facility]' ownership interest in [facility], as well as of the fact that they are under no obligation to receive care at this facility.  PASARR submitted to EDS on 11/22/2012 PASARR number received from EDS on 11/22/2012  FL2 transmitted to all facilities in geographic area requested by pt/family on  11/22/2012 FL2 transmitted to all facilities within larger geographic area on 11/22/2012  Patient informed that his/her managed care company has contracts with or will negotiate with  certain facilities, including the following:     Patient/family informed of bed offers received:  11/22/2012 Patient chooses bed at [facility] Physician recommends and patient chooses bed at    Patient to be transferred to [facility] on  11/22/2012 Patient to be transferred to facility by [facility]  The following physician request were entered in Epic:   Additional Comments:

## 2012-11-22 NOTE — Progress Notes (Signed)
ANTICOAGULATION CONSULT NOTE - Follow Up Consult  Pharmacy Consult for Warfarin  Indication: atrial fibrillation  Allergies  Allergen Reactions  . Pravachol Other (See Comments)    Muscle pain  . Pravastatin Sodium    Patient Measurements: Height: 5' (152.4 cm) Weight: 280 lb 3.3 oz (127.1 kg) IBW/kg (Calculated) : 45.5   Vital Signs: Temp: 97.9 F (36.6 C) (12/12 0524) Temp src: Oral (12/12 0524) BP: 133/72 mmHg (12/12 0524) Pulse Rate: 88  (12/12 0524)  Labs:  Basename 11/22/12 0445 11/21/12 0503 11/20/12 0515  HGB -- -- 11.7*  HCT -- -- 34.6*  PLT -- -- 225  APTT -- -- --  LABPROT 23.4* 21.5* 21.4*  INR 2.19* 1.95* 1.94*  HEPARINUNFRC -- -- --  CREATININE 0.63 -- 0.90  CKTOTAL -- -- --  CKMB -- -- --  TROPONINI -- -- --   Estimated Creatinine Clearance: 77.2 ml/min (by C-G formula based on Cr of 0.63).  Medications:  Scheduled:     . budesonide  0.25 mg Nebulization BID  . colchicine  0.6 mg Oral BID  . diltiazem  360 mg Oral Daily  . docusate sodium  100 mg Oral BID  . exemestane  25 mg Oral QPC breakfast  . feeding supplement  237 mL Oral Q24H  . insulin aspart  0-20 Units Subcutaneous TID WC  . insulin aspart  0-5 Units Subcutaneous QHS  . insulin aspart  10 Units Subcutaneous TID WC  . [COMPLETED] insulin aspart  10 Units Subcutaneous Once  . insulin glargine  30 Units Subcutaneous BID  . levalbuterol  0.63 mg Nebulization Q6H WA   And  . ipratropium  0.5 mg Nebulization Q6H WA  . metoprolol tartrate  25 mg Oral BID  . nystatin  5 mL Oral QID  . polyethylene glycol  17 g Oral Daily  . predniSONE  30 mg Oral Q breakfast  . simvastatin  40 mg Oral QHS  . theophylline  200 mg Oral BID  . torsemide  20 mg Oral Daily  . [COMPLETED] warfarin  6 mg Oral ONCE-1800  . Warfarin - Pharmacist Dosing Inpatient   Does not apply q1800  . [DISCONTINUED] allopurinol  100 mg Oral Daily  . [DISCONTINUED] fluconazole  100 mg Oral Daily  . [DISCONTINUED] insulin  aspart  8 Units Subcutaneous TID WC  . [DISCONTINUED] insulin glargine  25 Units Subcutaneous BID  . [DISCONTINUED] ipratropium  0.5 mg Nebulization Q6H  . [DISCONTINUED] levalbuterol  0.63 mg Nebulization Q6H   Anticoagulation therapy: 12/7 Vit K 5mg  sq 12/9 warfarin 4mg , 12/10 warfarin 6mg , 12/11 warfarin 6mg   Assessment:  62 yof with h/o atrial fibrillation on chronic Coumadin.  Admitted 12/6 for Afib with RVR and supratherapeutic INR.  Patient was reportedly on Coumadin 6 mg on MWF and 4 mg other days.  INR therapeutic (2.2) today.   Last CBC stable, no complications reported.   Goal of Therapy:  INR 2-3   Plan:   Noted plans for possible discharge today.  Recommend warfarin 6mg  daily with INR f/u tomorrow, 11/23/12  Daily PT/INR  Lynann Beaver PharmD, BCPS Pager [phone] 11/22/2012 10:21 AM

## 2012-11-22 NOTE — Discharge Summary (Signed)
Physician Discharge Summary  [name]:[id number] DOB: 05-16-1941 DOA: 11/16/2012  PCP: [name], MD  Admit date: 11/16/2012 Discharge date: 11/22/2012  Discharge Diagnoses:   Gout attack  COPD exacerbation  Afib with RVR  Respiratory failure with hypoxia  Breast cancer, stage 1  Diabetes mellitus  Acute renal failure   Discharge Condition: stable.  Disposition: Please adjust prednisone dose, needs INR in 24 hour. Make sure patient has adequate intake.   Diet: Diabetic diet. Wt Readings from Last 3 Encounters:  11/16/12 127.1 kg (280 lb 3.3 oz)  11/02/12 124.376 kg (274 lb 3.2 oz)  08/08/12 133.358 kg (294 lb)    History of present illness:  71 year old female with a history of A. fib, posterior to sleep apnea, sarcoidosis, morbid obesity, gout, breast cancer who came to the hospital with worsening of the wrist pain. Patient usually uses walker at home and was unable to use because of the worsening of the wrist pain on both sides. Patient denies any chest pain does report to having some shortness of breath denies nausea vomiting or diarrhea. Denies dysuria urgency frequency of urination. Denies any fever.   Hospital Course:  71 year old admitted with gout flare, she develop COPD exacerbation. Lower extremities and wrist pain has improved. Her Breathing is also better. She develop oral candidiasis for which she received a dose of oral diflucan and nystatin suspension. She will be discharge on prednisone taper dose and schedule colchicine. Please taper down colchicine dose.   1-COPD exacerbation:  Continue with Nebulizer, prednisone taper. - CXR negative for any PNA  2-Gout attack:  - Continue prednisone, and Colchicine.  - serum uric acid 11.2, RA factor slightly elevated at 18, CCP antibodies are within normal limits hence acute gout attack confirmed  3-Afib with RVR: Right now controlled, INR was supratherapeutic on adm  - Continue beta blocker and Cardizem,   - Coumadin per pharmacy. Need INR in 24 hour. Breast cancer, stage 1: cont aromasin  Diabetes mellitus: Blood sugars uncontrolled due to prednisone  - Increased Lantus to 30 units, increase meal coverage,. Hyponatremia: Psuedo-hyponatremia, continue current medication  Acute renal failure: resolved  - Cont torsemide daily. B-met am.  DVT Prophylaxis   Discharge Exam: Filed Vitals:   11/22/12 0524  BP: 133/72  Pulse: 88  Temp: 97.9 F (36.6 C)  Resp: 18   Filed Vitals:   11/21/12 2105 11/21/12 2347 11/22/12 0524 11/22/12 0910  BP: 138/67  133/72   Pulse: 86 84 88   Temp: 98.4 F (36.9 C)  97.9 F (36.6 C)   TempSrc: Oral  Oral   Resp: 18 18 18    Height:      Weight:      SpO2: 97%  99% 93%   General: No distress. Cardiovascular: S 1, S 2, RRR Respiratory: CTA, no wheezes.  Discharge Instructions  Discharge Orders    Future Appointments: Provider: Department: Dept Phone: Center:   11/28/2012 9:45 AM Kirkland Hun, MD [facility] [phone] None   03/08/2013 10:00 AM Radene Gunning [facility] MED[facility] ONCOL[facility] [phone] None   03/08/2013 10:30 AM Victorino December, MD [facility] MEDICAL ONCOLOGY [phone] None   11/04/2013 11:00 AM Waymon Budge, MD [facility] [phone] None     Future Orders Please Complete By Expires   Diet Carb Modified      Increase activity slowly          Medication List  As of 11/22/2012 11:19 AM    STOP taking these medications         ADVAIR DISKUS 250-50 MCG/DOSE Aepb   Generic drug: Fluticasone-Salmeterol      albuterol (2.5 MG/3ML) 0.083% nebulizer solution   Commonly known as: PROVENTIL      albuterol 108 (90 BASE) MCG/ACT inhaler   Commonly known as: PROVENTIL HFA;VENTOLIN HFA      potassium chloride SA 20 MEQ tablet   Commonly known as: K-DUR,KLOR-CON      TAKE these medications         acetaminophen 500 MG tablet   Commonly known  as: TYLENOL   Take 1,000 mg by mouth every 6 (six) hours as needed. pain      allopurinol 100 MG tablet   Commonly known as: ZYLOPRIM   Take 200 mg by mouth daily.      antiseptic oral rinse Liqd   15 mLs by Mouth Rinse route as needed.      budesonide 0.25 MG/2ML nebulizer solution   Commonly known as: PULMICORT   Take 2 mLs (0.25 mg total) by nebulization 2 (two) times daily.      CALCIUM 600 + D PO   Take 1 tablet by mouth 2 (two) times daily.      cholecalciferol 1000 UNITS tablet   Commonly known as: VITAMIN D   Take 1 tablet (1,000 Units total) by mouth daily.      colchicine 0.6 MG tablet   Take 1 tablet (0.6 mg total) by mouth 2 (two) times daily.      diltiazem 360 MG 24 hr capsule   Commonly known as: TIAZAC   Take 1 tablet by mouth daily.      exemestane 25 MG tablet   Commonly known as: AROMASIN   Take 1 tablet (25 mg total) by mouth daily after breakfast.      ferrous gluconate 246 (28 FE) MG tablet   Commonly known as: FERGON   Take 1 tablet (246 mg total) by mouth daily with breakfast.      HYDROcodone-acetaminophen 5-325 MG per tablet   Commonly known as: NORCO/VICODIN   Take 1 tablet by mouth every 6 (six) hours as needed. For pain.      insulin aspart 100 UNIT/ML injection   Commonly known as: novoLOG   Inject 10 Units into the skin 3 (three) times daily with meals.      insulin glargine 100 UNIT/ML injection   Commonly known as: LANTUS   Inject 30 Units into the skin at bedtime.      ipratropium 0.02 % nebulizer solution   Commonly known as: ATROVENT   Take 2.5 mLs (0.5 mg total) by nebulization every 6 (six) hours.      levalbuterol 0.63 MG/3ML nebulizer solution   Commonly known as: XOPENEX   Take 3 mLs (0.63 mg total) by nebulization every 6 (six) hours.      LORazepam 0.5 MG tablet   Commonly known as: ATIVAN   Take 0.5 mg by mouth at bedtime as needed. For sleep.      meclizine 25 MG tablet   Commonly known as: ANTIVERT   Take  6.25-12.5 mg by mouth 3 (three) times daily as needed. For dizziness.      metFORMIN 500 MG tablet   Commonly known as: GLUCOPHAGE   Take 500 mg by mouth 2 (two) times daily with a meal.      metoprolol tartrate 25 MG tablet   Commonly known  as: LOPRESSOR   Take 25 mg by mouth 2 (two) times daily.      multivitamin with minerals Tabs   Take 1 tablet by mouth daily.      nystatin 100000 UNIT/ML suspension   Commonly known as: MYCOSTATIN   Take 5 mLs (500,000 Units total) by mouth 4 (four) times daily.      polyethylene glycol packet   Commonly known as: MIRALAX / GLYCOLAX   Take 17 g by mouth daily.      predniSONE 10 MG tablet   Commonly known as: DELTASONE   Take 3 tablets for 3 days then 2 tablet for 3 days then 1 tablet for one day then stop.      promethazine 25 MG tablet   Commonly known as: PHENERGAN   Take 25 mg by mouth every 6 (six) hours as needed. For nausea.      simvastatin 40 MG tablet   Commonly known as: ZOCOR   Take 40 mg by mouth at bedtime.      theophylline 200 MG 12 hr tablet   Commonly known as: THEODUR   Take 200 mg by mouth 2 (two) times daily.      torsemide 20 MG tablet   Commonly known as: DEMADEX   Take 20 mg by mouth 2 (two) times daily.      warfarin 4 MG tablet   Commonly known as: COUMADIN   Take 4-6 mg by mouth daily. Take 1 tab daily except for 1.5 tab on MWF.          The results of significant diagnostics from this hospitalization (including imaging, microbiology, ancillary and laboratory) are listed below for reference.    Significant Diagnostic Studies: Dg Chest 2 View  11/19/2012  *RADIOLOGY REPORT*  Clinical Data: Cough, shortness of breath, dyspnea, question pneumonia  CHEST - 2 VIEW  Comparison: 11/16/2012  Findings: Enlargement of cardiac silhouette with pulmonary vascular congestion. Mediastinal contours stable. Mild bibasilar atelectasis. No definite infiltrate, pleural effusion or pneumothorax. Bones unremarkable.   IMPRESSION: Bibasilar atelectasis.   Original Report Authenticated By: Ulyses Southward, M.D.    Dg Chest 2 View  11/16/2012  *RADIOLOGY REPORT*  Clinical Data: Dyspnea, shortness of breath  CHEST - 2 VIEW  Comparison: 12/22/2011  Findings: Cardiomegaly is noted.  Study is markedly limited by patient's large body habitus and poor inspiration.  Lung bases are obscured by overlying abdominal and chest wall tissue.  Mild interstitial prominence bilaterally.  Mild interstitial edema cannot be excluded.  No gross segmental infiltrate.  IMPRESSION: Markedly limited study as described above.  Cardiomegaly is noted. Mild interstitial prominence bilaterally.  Mild interstitial edema cannot be excluded.  No gross segmental infiltrate.   Original Report Authenticated By: Natasha Mead, M.D.    Dg Wrist Complete Left  11/16/2012  *RADIOLOGY REPORT*  Clinical Data: Bilateral wrist pain.  Question gout.  LEFT WRIST - COMPLETE 3+ VIEW  Comparison: None.  Findings: Mild diffuse osteopenia is present.  Soft tissue swelling is present about the wrist.  There are no erosions.  Joint spaces are preserved.  IMPRESSION: Osteopenia and diffuse soft tissue swelling.  This could represent an inflammatory arthritis although no erosions are evident.   Original Report Authenticated By: Marin Roberts, M.D.    Dg Wrist Complete Right  11/16/2012  *RADIOLOGY REPORT*  Clinical Data: Bilateral wrist pain.  Question gout.  RIGHT WRIST - COMPLETE 3+ VIEW  Comparison: Left wrist films of the same day.  Findings: Mild osteopenia is  present.  There is some loss of joint space at the [facility] joints.  Joint spaces are otherwise preserved. Subchondral cystic changes are noted in the distal ulna and scaphoid.  A periarticular erosion is suggested at the distal first metacarpal.  IMPRESSION:  1.  This changes compatible with an inflammatory arthritis, suggesting rheumatoid arthritis or possibly early gout.   Original Report Authenticated By: Marin Roberts, M.D.     Microbiology: No results found for this or any previous visit (from the past 240 hour(s)).   Labs: Basic Metabolic Panel:  Lab 11/22/12 1610 11/20/12 0515 11/19/12 0508 11/18/12 0545 11/17/12 1845 11/17/12 0540 11/17/12 0445  NA 139 132* 129* 126* -- 131* --  K 3.9 4.0 -- -- -- -- --  CL 100 96 93* 89* -- 92* --  CO2 32 27 21 22  -- 25 --  GLUCOSE 270* 384* 461* 386* 458* -- --  BUN 20 37* 65* 68* -- 45* --  CREATININE 0.63 0.90 1.46* 2.30* -- 1.60* --  CALCIUM 9.0 8.6 8.6 8.4 -- 8.7 --  MG -- -- -- 1.5 -- -- 1.1*  PHOS -- -- -- -- -- -- --   Liver Function Tests:  Lab 11/17/12 0540  AST 9  ALT 7  ALKPHOS 75  BILITOT 0.8  PROT 7.1  ALBUMIN 2.6*   CBC:  Lab 11/20/12 0515 11/19/12 0508 11/18/12 0545 11/17/12 0540 11/16/12 1554 11/16/12 1545  WBC 5.7 7.5 5.5 6.7 -- 5.4  NEUTROABS -- -- -- -- -- 3.8  HGB 11.7* 12.0 12.0 13.1 13.6 --  HCT 34.6* 35.3* 36.1 38.2 40.0 --  MCV 75.7* 75.6* 75.5* 76.6* -- 74.9*  PLT 225 236 212 187 -- 190   Cardiac Enzymes: No results found for this basename: CKTOTAL:5,CKMB:5,CKMBINDEX:5,TROPONINI:5 in the last 168 hours BNP: No components found with this basename: POCBNP:5 CBG:  Lab 11/22/12 0718 11/22/12 0524 11/21/12 2101 11/21/12 1726 11/21/12 1153  GLUCAP 222* 242* 494* 472* 321*    Time coordinating discharge: 35 minutes.  SignedHartley Barefoot  [facility] 11/22/2012, 11:19 AM

## 2012-11-22 NOTE — Progress Notes (Signed)
Patient cleared for discharge. Packet copied and placed in [facility]. ptar called for transportation.  Teliyah Royal C. Kyree Adriano MSW, LCSW [phone]  

## 2012-11-28 ENCOUNTER — Ambulatory Visit: Payer: Self-pay | Admitting: Obstetrics and Gynecology

## 2012-12-28 ENCOUNTER — Encounter (INDEPENDENT_AMBULATORY_CARE_PROVIDER_SITE_OTHER): Payer: Self-pay | Admitting: General Surgery

## 2013-01-29 ENCOUNTER — Encounter: Payer: Self-pay | Admitting: Obstetrics and Gynecology

## 2013-01-29 ENCOUNTER — Ambulatory Visit: Payer: Medicare Other | Admitting: Obstetrics and Gynecology

## 2013-01-29 VITALS — BP 142/82 | Ht 60.0 in | Wt 274.0 lb

## 2013-01-29 DIAGNOSIS — Z01419 Encounter for gynecological examination (general) (routine) without abnormal findings: Secondary | ICD-10-CM

## 2013-01-29 NOTE — Progress Notes (Signed)
Subjective:    [name] is a 73 y.o. female No obstetric history on file. who presents for annual exam. The patient complaints of a gout attack.  The patient had a TAH/BSO and node dissection in 2006 because of endometrial cancer.  She had a large breast biopsy and radiation in 2013 because of breast cancer.  The following portions of the patient's history were reviewed and updated as appropriate: allergies, current medications, past family history, past medical history, past social history, past surgical history and problem list.  Review of Systems multiple complaints Gastrointestinal:No change in bowel habits, no abdominal pain, no rectal bleeding Genitourinary:negative for dysuria, frequency, hematuria, nocturia and urinary incontinence    Objective:     BP 142/82  Ht 5' (1.524 m)  Wt 274 lb (124.286 kg)  BMI 53.51 kg/m2  Weight:  Wt Readings from Last 1 Encounters:  01/29/13 274 lb (124.286 kg)     BMI: Body mass index is 53.51 kg/(m^2). General Appearance: Alert, appropriate appearance for age. No acute distress HEENT: Grossly normal Neck / Thyroid: Supple, no masses, nodes or enlargement Lungs: clear to auscultation bilaterally Back: No CVA tenderness Breast Exam: absent nipple on the right and No masses or nodes.No dimpling, nipple retraction or discharge. Cardiovascular: Regular rate and rhythm. S1, S2, no murmur Gastrointestinal: Soft, non-tender, no masses or organomegaly  ++++++++++++++++++++++++++++++++++++++++++++++++++++++++  Pelvic Exam: difficult exam because of massive obesity.  Atrophic vagina noted.  No lesions present. Rectovaginal: normal rectal, no masses  ++++++++++++++++++++++++++++++++++++++++++++++++++++++++  Lymphatic Exam: Non-palpable nodes in neck, clavicular, axillary, or inguinal regions  Psychiatric: Alert and oriented, appropriate affect.      Assessment:    past history of endometrial cancer.  Currently no evidence of disease.    Past history of breast cancer.  Currently no evidence of disease.  Overweight or obese: Yes  Pelvic relaxation: Yes  Menopausal symptoms: No. Severe: No.  Multiple medical problems including gout.   Plan:    See primary M.D. for further management of her multiple medical problems   Follow-up:  for annual exam  The updated Pap smear screening guidelines were discussed with the patient. The patient requested that I obtain a Pap smear: No.  Kegel exercises discussed: No.  Proper diet and regular exercise were reviewed.  Annual mammograms recommended starting at age 7. Proper breast care was discussed.  Screening colonoscopy is recommended beginning at age 16.  Regular health maintenance was reviewed.  Leonard Schwartz M.D.   Regular Periods: no Mammogram: yes  Monthly Breast Ex.: yes Exercise: no  Tetanus < 10 years: yes Seatbelts: yes  NI. Bladder Functn.: yes Abuse at home: no  Daily BM's: yes Stressful Work: no  Healthy Diet: yes Sigmoid-Colonoscopy: 2005  Calcium: yes Medical problems this year: none   LAST PAP: 10/24/2011  Contraception: Hysterectomy  Mammogram:  07/10/2012  PCP: [name]  PMH: None  FMH: None  Last Bone Scan: per pt long time ago.

## 2013-02-25 ENCOUNTER — Ambulatory Visit (INDEPENDENT_AMBULATORY_CARE_PROVIDER_SITE_OTHER): Payer: Medicare Other | Admitting: General Surgery

## 2013-03-08 ENCOUNTER — Ambulatory Visit (HOSPITAL_BASED_OUTPATIENT_CLINIC_OR_DEPARTMENT_OTHER): Payer: Medicare Other | Admitting: Oncology

## 2013-03-08 ENCOUNTER — Other Ambulatory Visit (HOSPITAL_BASED_OUTPATIENT_CLINIC_OR_DEPARTMENT_OTHER): Payer: Medicare Other | Admitting: Lab

## 2013-03-08 ENCOUNTER — Telehealth: Payer: Self-pay | Admitting: Oncology

## 2013-03-08 ENCOUNTER — Encounter: Payer: Self-pay | Admitting: Oncology

## 2013-03-08 VITALS — BP 107/68 | HR 71 | Temp 94.7°F | Resp 20 | Ht 61.0 in | Wt 259.9 lb

## 2013-03-08 DIAGNOSIS — C50911 Malignant neoplasm of unspecified site of right female breast: Secondary | ICD-10-CM

## 2013-03-08 DIAGNOSIS — Z170 Estrogen receptor positive status [ER+]: Secondary | ICD-10-CM

## 2013-03-08 DIAGNOSIS — D649 Anemia, unspecified: Secondary | ICD-10-CM

## 2013-03-08 DIAGNOSIS — C50419 Malignant neoplasm of upper-outer quadrant of unspecified female breast: Secondary | ICD-10-CM

## 2013-03-08 DIAGNOSIS — E559 Vitamin D deficiency, unspecified: Secondary | ICD-10-CM

## 2013-03-08 DIAGNOSIS — C50919 Malignant neoplasm of unspecified site of unspecified female breast: Secondary | ICD-10-CM

## 2013-03-08 DIAGNOSIS — C50111 Malignant neoplasm of central portion of right female breast: Secondary | ICD-10-CM

## 2013-03-08 DIAGNOSIS — M810 Age-related osteoporosis without current pathological fracture: Secondary | ICD-10-CM

## 2013-03-08 DIAGNOSIS — Z79811 Long term (current) use of aromatase inhibitors: Secondary | ICD-10-CM

## 2013-03-08 LAB — COMPREHENSIVE METABOLIC PANEL (CC13)
ALT: 9 U/L (ref 0–55)
AST: 20 U/L (ref 5–34)
Albumin: 3.7 g/dL (ref 3.5–5.0)
Calcium: 10.6 mg/dL — ABNORMAL HIGH (ref 8.4–10.4)
Chloride: 98 mEq/L (ref 98–107)
Creatinine: 1.1 mg/dL (ref 0.6–1.1)
Potassium: 4 mEq/L (ref 3.5–5.1)

## 2013-03-08 LAB — CBC WITH DIFFERENTIAL/PLATELET
BASO%: 0.4 % (ref 0.0–2.0)
MCHC: 33.3 g/dL (ref 31.5–36.0)
MONO#: 0.9 10*3/uL (ref 0.1–0.9)
RBC: 5.54 10*6/uL — ABNORMAL HIGH (ref 3.70–5.45)
RDW: 17.7 % — ABNORMAL HIGH (ref 11.2–14.5)
WBC: 7.4 10*3/uL (ref 3.9–10.3)
lymph#: 1.7 10*3/uL (ref 0.9–3.3)

## 2013-03-08 LAB — VITAMIN D 25 HYDROXY (VIT D DEFICIENCY, FRACTURES): Vit D, 25-Hydroxy: 43 ng/mL (ref 30–89)

## 2013-03-08 MED ORDER — EXEMESTANE 25 MG PO TABS: 25.0000 mg | ORAL_TABLET | Freq: Every day | ORAL | Status: DC

## 2013-03-08 NOTE — Patient Instructions (Addendum)
Continue aromasin  Schedule for bone density  Refills of aromasin sent to your pharmacy  I will see you back in 6 months

## 2013-03-08 NOTE — Progress Notes (Signed)
Hematology and Oncology Follow Up Visit  [name] [id number] 12-17-1940 72 y.o. 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  #2 iron deficiency anemia   PAST THERAPY:  #1 patient is a lumpectomy of the right breast in May 2012. 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 2 sentinel nodes were negative for metastatic disease.  #2 she then went on to receive radiation therapy to the breast from 03/28/2011 to 04/25/2011.  #3 she is now status post right knee replacement and is getting physical therapy.  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  Interim History:  Patient is seen in followup today overall she seems to be doing well.Overall she is doing well. She is on Aromasin which she tolerates much better than the Arimidex that I had originally prescribed. She today feels well she is using a walker. She denies any fevers chills night sweats headaches shortness of breath chest pains palpitations she has no myalgias or arthralgias she does experience some hot flashes she has no bleeding remainder of the 10 point review of systems is negative.  Medications: Continuous: Current Outpatient Prescriptions on File Prior to Visit  Medication Sig Dispense Refill  . acetaminophen (TYLENOL) 500 MG tablet Take 1,000 mg by mouth every 6 (six) hours as needed. pain      . allopurinol (ZYLOPRIM) 100 MG tablet Take 200 mg by mouth daily.       . budesonide (PULMICORT) 0.25 MG/2ML nebulizer solution Take 2 mLs (0.25 mg total) by nebulization 2 (two) times daily.  60 mL  0  . Calcium Carbonate-Vitamin D (CALCIUM 600 + D PO) Take 1 tablet by mouth 2 (two) times  daily.       . cholecalciferol (VITAMIN D) 1000 UNITS tablet Take 1 tablet (1,000 Units total) by mouth daily.  30 tablet  12  . colchicine 0.6 MG tablet Take 1 tablet (0.6 mg total) by mouth 2 (two) times daily.  60 tablet  0  . diltiazem (TIAZAC) 360 MG 24 hr capsule Take 1 tablet by mouth daily.       . insulin aspart (NOVOLOG) 100 UNIT/ML injection Inject 10 Units into the skin 3 (three) times daily with meals.  1 vial  0  . insulin glargine (LANTUS) 100 UNIT/ML injection Inject 30 Units into the skin at bedtime.  10 mL  0  . ipratropium (ATROVENT) 0.02 % nebulizer solution Take 2.5 mLs (0.5 mg total) by nebulization every 6 (six) hours.  75 mL  0  . LORazepam (ATIVAN) 0.5 MG tablet Take 0.5 mg by mouth at bedtime as needed. For sleep.      . meclizine (ANTIVERT) 25 MG tablet Take 6.25-12.5 mg by mouth 3 (three) times daily as needed. For dizziness.      . metFORMIN (GLUCOPHAGE) 500 MG tablet Take 500 mg by mouth 2 (two) times daily with a meal.      . metoprolol tartrate (LOPRESSOR) 25 MG tablet Take 25 mg by mouth 2 (two) times daily.       . Multiple Vitamin (MULTIVITAMIN WITH MINERALS) TABS Take 1 tablet by mouth daily.      . simvastatin (ZOCOR) 40 MG tablet Take 40 mg by  mouth at bedtime.      . theophylline (THEODUR) 200 MG 12 hr tablet Take 200 mg by mouth 2 (two) times daily.      Marland Kitchen torsemide (DEMADEX) 20 MG tablet Take 20 mg by mouth 2 (two) times daily.      Marland Kitchen warfarin (COUMADIN) 4 MG tablet Take 4-6 mg by mouth daily. Take 1 tab daily except for 1.5 tab on MWF.      Marland Kitchen antiseptic oral rinse (BIOTENE) LIQD 15 mLs by Mouth Rinse route as needed.  59 mL  0  . exemestane (AROMASIN) 25 MG tablet Take 1 tablet (25 mg total) by mouth daily after breakfast.  30 tablet  5  . ferrous gluconate (FERGON) 246 (28 FE) MG tablet Take 1 tablet (246 mg total) by mouth daily with breakfast.  30 tablet  6  . HYDROcodone-acetaminophen (NORCO/VICODIN) 5-325 MG per tablet Take 1 tablet by mouth every 6  (six) hours as needed. For pain.  30 tablet  0  . levalbuterol (XOPENEX) 0.63 MG/3ML nebulizer solution Take 3 mLs (0.63 mg total) by nebulization every 6 (six) hours.  3 mL  0  . polyethylene glycol (MIRALAX / GLYCOLAX) packet Take 17 g by mouth daily.  14 each  0  . predniSONE (DELTASONE) 10 MG tablet Take 3 tablets for 3 days then 2 tablet for 3 days then 1 tablet for one day then stop.  16 tablet  0  . promethazine (PHENERGAN) 25 MG tablet Take 25 mg by mouth every 6 (six) hours as needed. For nausea.       No current facility-administered medications on file prior to visit.    Allergies:  Allergies  Allergen Reactions  . Pravachol Other (See Comments)    Muscle pain  . Pravastatin Sodium     Past Medical History, Surgical history, Social history, and Family History were reviewed and updated.  Review of Systems: Constitutional:  Negative for fever, chills, night sweats, anorexia, weight loss, pain. Cardiovascular: no chest pain or dyspnea on exertion Respiratory: no cough, shortness of breath, or wheezing Neurological: no TIA or stroke symptoms Dermatological: negative ENT: negative Skin Gastrointestinal: no abdominal pain, change in bowel habits, or black or bloody stools Genito-Urinary: no dysuria, trouble voiding, or hematuria Hematological and Lymphatic: negative Breast: negative for breast lumps Musculoskeletal: Patient does complain of having some aches and pains in her knees which is recovering. Remaining ROS negative.  Physical Exam:General appearance: alert, cooperative, appears stated age and fatigued  Blood pressure 107/68, pulse 71, temperature 94.7 F (34.8 C), temperature source Oral, resp. rate 20, height 5\' 1"  (1.549 m), weight 259 lb 14.4 oz (117.89 kg). ENT exam is unremarkable l LUNGS: are clear bilaterally to auscultation and percussion CVS: rate rhythm no murmurs gallops or rubs ABD: Soft nontender nondistended bowel sounds are present  hepato/splenomegaly EXT: No edema clubbing or cyanosis BREAST: The lateral breasts reveal no masses nipple discharge. Well-healed surgical scar in the right breast NEURO: Alert oriented x3. DTR +4 motor and sensory is intact strength is symmetrical in upper and lower extremity  ECOG: 1   Lab Results: Lab Results  Component Value Date   WBC 7.4 03/08/2013   HGB 15.0 03/08/2013   HCT 45.0 03/08/2013   MCV 81.2 03/08/2013   PLT 240 03/08/2013     Chemistry      Component Value Date/Time   NA 141 03/08/2013 0959   NA 139 11/22/2012 0445   K 4.0 03/08/2013 0959   K  3.9 11/22/2012 0445   CL 98 03/08/2013 0959   CL 100 11/22/2012 0445   CO2 29 03/08/2013 0959   CO2 32 11/22/2012 0445   BUN 12.6 03/08/2013 0959   BUN 20 11/22/2012 0445   CREATININE 1.1 03/08/2013 0959   CREATININE 0.63 11/22/2012 0445      Component Value Date/Time   CALCIUM 10.6* 03/08/2013 0959   CALCIUM 9.0 11/22/2012 0445   ALKPHOS 91 03/08/2013 0959   ALKPHOS 75 11/17/2012 0540   AST 20 03/08/2013 0959   AST 9 11/17/2012 0540   ALT 9 03/08/2013 0959   ALT 7 11/17/2012 0540   BILITOT 0.86 03/08/2013 0959   BILITOT 0.8 11/17/2012 0540       Radiological Studies: NONE     IMPRESSIONS AND PLAN: A 72 y.o. 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 She is on Aromasin 25 mg daily.refills for Aromasin were sent to her pharmacy.  #2 patient will be scheduled for a bone density scan.  #3 anemia patient will continue tandem one daily for about 6 months time.  #4 patient will return to see me in 6 months time or sooner if need arises   Spent more than half the time coordinating care.    Drue Second, MD Medical/Oncology [facility] [phone] (beeper) [phone] (Office)  03/08/2013, 11:09 AM 3/28/201411:09 AM

## 2013-03-08 NOTE — Telephone Encounter (Signed)
gv pt appt schedule for September and bone density test for 4/23.

## 2013-03-11 ENCOUNTER — Telehealth: Payer: Self-pay | Admitting: Dietician

## 2013-03-15 ENCOUNTER — Telehealth: Payer: Self-pay | Admitting: Dietician

## 2013-04-03 ENCOUNTER — Ambulatory Visit
Admission: RE | Admit: 2013-04-03 | Discharge: 2013-04-03 | Disposition: A | Discharge status: Home / Self Care | Payer: Medicare Other | Source: Ambulatory Visit | Attending: Oncology | Admitting: Oncology

## 2013-04-03 DIAGNOSIS — Z79811 Long term (current) use of aromatase inhibitors: Secondary | ICD-10-CM

## 2013-04-03 DIAGNOSIS — M810 Age-related osteoporosis without current pathological fracture: Secondary | ICD-10-CM

## 2013-04-03 NOTE — Progress Notes (Signed)
Quick Note:  Call patient: start fosamax q week ______

## 2013-04-04 ENCOUNTER — Telehealth: Payer: Self-pay | Admitting: Medical Oncology

## 2013-04-04 NOTE — Telephone Encounter (Signed)
Message copied by Rexene Edison on Thu Apr 04, 2013 12:58 PM ------      Message from: [name]      Created: Wed Apr 03, 2013  8:37 PM       Call patient: start fosamax q week ------

## 2013-04-05 NOTE — Telephone Encounter (Signed)
Per MD, informed patient that Dr [name] would like for her to start taking Fosamax every week d/t the results of her bone density scan. Informed patient that she is to take one tablet by mouth every 7 days with a full glass of water on an empty stomach and to not sit for at least 30 minutes. Patient verbalized understanding and asked for prescription to be sent to her pharmacy, [facility]. Encouraged patient to call office with any questions or concerns.

## 2013-04-08 ENCOUNTER — Encounter (INDEPENDENT_AMBULATORY_CARE_PROVIDER_SITE_OTHER): Payer: Self-pay | Admitting: General Surgery

## 2013-04-08 ENCOUNTER — Ambulatory Visit (INDEPENDENT_AMBULATORY_CARE_PROVIDER_SITE_OTHER): Payer: Medicare Other | Admitting: General Surgery

## 2013-04-08 VITALS — BP 114/70 | HR 82 | Temp 97.5°F | Ht 59.0 in | Wt 260.0 lb

## 2013-04-08 DIAGNOSIS — K111 Hypertrophy of salivary gland: Secondary | ICD-10-CM

## 2013-04-08 DIAGNOSIS — C50911 Malignant neoplasm of unspecified site of right female breast: Secondary | ICD-10-CM

## 2013-04-08 DIAGNOSIS — C50919 Malignant neoplasm of unspecified site of unspecified female breast: Secondary | ICD-10-CM

## 2013-04-08 MED ORDER — ALENDRONATE SODIUM 70 MG PO TABS: 70.0000 mg | ORAL_TABLET | ORAL | Status: DC

## 2013-04-08 NOTE — Patient Instructions (Signed)
Your mammograms performed on 02/04/2013 looked fine. There is no focal abnormality.  Examination of your breasts and lymph node areas today shows no evidence of cancer.  The swelling in your upper left neck may be a swelling in your left parotid gland, one of the salivary glands. You'll be referred to an ear, nose  and throat specialist to check this out.  Continue regular followups with Dr. Drue Second.  Return to see Dr. [name] in one year, after you get your annual mammograms.

## 2013-04-08 NOTE — Progress Notes (Signed)
Patient ID: [name], female   DOB: 1941/11/07, 72 y.o.   MRN: [id number] History:This 72 year old woman is now two years out from her right breast conservation surgery for breast cancer.  She underwent right partial mastectomy, reexcision margins, and sentinel node biopsy on February 08, 2011. Her final pathology showed a pathologic stage TI B., N0, receptor positive, HER-2 negative, Ki-67 8% tumor.  She went on to have radiation therapy and is now on Aromasin and being followed by Dr. Garey Larson.  She had mammograms lon 02/04/13  at [place]. No focal abnormality, category 2..  She has no complaints about her breast. . She is followed  Dr. Holley Larson for her atrial fibrillation. She is followed  for her sarcoidosis  ROS:   continues to ambulate with a walker and drives her car. She feels better now she's had knee replacement but still disabled. Otherwise 10 system review of systems is unchanged except as described above. She does mention a painless swelling in her left neck below the angle of her mandible.  Exam: Patient looks well. No distress. Friendly his usual. Neck:  painless firm swelling, 1.5 cm in size,  below left angle of mandible. Feels like the parotid gland mass or swelling ;  no other adenopathy in neck. Lungs: Clear auscultation bilateral bilaterally Heart: Regular rate and rhythm. No murmur. No ectopy Breasts:  large, pendulous. Well-healed scar right breast at 9 oclock position. Well-healed right axilla scar. No mass in either breast. No axillary adenopathy.  Assessment: Invasive mammary carcinoma right breast, upper outer quadrant, pathologic stage TI B., N0, receptor positive, HER-2 negative. No evidence of recurrence 2 years following right partial mastectomy, reexcision margins, and sentinel node biopsy. Extensive multiple medical problems Possible left parotid mass  Plan: The patient will be referred to otolaryngology for evaluation of the possible left parotid  mass She will see Dr. Drue Larson every 6 months Continue Aromasin Repeat bilateral mammograms that were 2015 Return to see me one year   .[name]. [name], M.D., [facility] General and Minimally invasive Surgery Breast and Colorectal Surgery Office:   [phone] Pager:   [phone]

## 2013-05-03 ENCOUNTER — Emergency Department (HOSPITAL_COMMUNITY): Payer: Medicare Other

## 2013-05-03 ENCOUNTER — Observation Stay (HOSPITAL_COMMUNITY): Payer: Medicare Other

## 2013-05-03 ENCOUNTER — Encounter (HOSPITAL_COMMUNITY): Payer: Self-pay | Admitting: Emergency Medicine

## 2013-05-03 ENCOUNTER — Inpatient Hospital Stay (HOSPITAL_COMMUNITY)
Admission: EM | Admit: 2013-05-03 | Discharge: 2013-05-14 | DRG: 190 | Disposition: A | Discharge status: Home Health | Payer: Medicare Other | Attending: Internal Medicine | Admitting: Internal Medicine

## 2013-05-03 DIAGNOSIS — Z79899 Other long term (current) drug therapy: Secondary | ICD-10-CM

## 2013-05-03 DIAGNOSIS — I5031 Acute diastolic (congestive) heart failure: Secondary | ICD-10-CM | POA: Diagnosis present

## 2013-05-03 DIAGNOSIS — J069 Acute upper respiratory infection, unspecified: Secondary | ICD-10-CM

## 2013-05-03 DIAGNOSIS — J99 Respiratory disorders in diseases classified elsewhere: Secondary | ICD-10-CM | POA: Diagnosis present

## 2013-05-03 DIAGNOSIS — M109 Gout, unspecified: Secondary | ICD-10-CM | POA: Diagnosis present

## 2013-05-03 DIAGNOSIS — J441 Chronic obstructive pulmonary disease with (acute) exacerbation: Principal | ICD-10-CM | POA: Diagnosis present

## 2013-05-03 DIAGNOSIS — E876 Hypokalemia: Secondary | ICD-10-CM | POA: Diagnosis present

## 2013-05-03 DIAGNOSIS — K5730 Diverticulosis of large intestine without perforation or abscess without bleeding: Secondary | ICD-10-CM | POA: Diagnosis present

## 2013-05-03 DIAGNOSIS — K449 Diaphragmatic hernia without obstruction or gangrene: Secondary | ICD-10-CM | POA: Diagnosis present

## 2013-05-03 DIAGNOSIS — Z6841 Body Mass Index (BMI) 40.0 and over, adult: Secondary | ICD-10-CM

## 2013-05-03 DIAGNOSIS — J9691 Respiratory failure, unspecified with hypoxia: Secondary | ICD-10-CM

## 2013-05-03 DIAGNOSIS — Z794 Long term (current) use of insulin: Secondary | ICD-10-CM

## 2013-05-03 DIAGNOSIS — I4891 Unspecified atrial fibrillation: Secondary | ICD-10-CM

## 2013-05-03 DIAGNOSIS — Z96659 Presence of unspecified artificial knee joint: Secondary | ICD-10-CM

## 2013-05-03 DIAGNOSIS — D869 Sarcoidosis, unspecified: Secondary | ICD-10-CM

## 2013-05-03 DIAGNOSIS — R55 Syncope and collapse: Secondary | ICD-10-CM | POA: Diagnosis present

## 2013-05-03 DIAGNOSIS — R112 Nausea with vomiting, unspecified: Secondary | ICD-10-CM

## 2013-05-03 DIAGNOSIS — E785 Hyperlipidemia, unspecified: Secondary | ICD-10-CM | POA: Diagnosis present

## 2013-05-03 DIAGNOSIS — E662 Morbid (severe) obesity with alveolar hypoventilation: Secondary | ICD-10-CM | POA: Diagnosis present

## 2013-05-03 DIAGNOSIS — J690 Pneumonitis due to inhalation of food and vomit: Secondary | ICD-10-CM | POA: Diagnosis present

## 2013-05-03 DIAGNOSIS — E1142 Type 2 diabetes mellitus with diabetic polyneuropathy: Secondary | ICD-10-CM | POA: Diagnosis present

## 2013-05-03 DIAGNOSIS — M793 Panniculitis, unspecified: Secondary | ICD-10-CM | POA: Diagnosis present

## 2013-05-03 DIAGNOSIS — J9620 Acute and chronic respiratory failure, unspecified whether with hypoxia or hypercapnia: Secondary | ICD-10-CM | POA: Diagnosis present

## 2013-05-03 DIAGNOSIS — E119 Type 2 diabetes mellitus without complications: Secondary | ICD-10-CM

## 2013-05-03 DIAGNOSIS — D649 Anemia, unspecified: Secondary | ICD-10-CM

## 2013-05-03 DIAGNOSIS — J40 Bronchitis, not specified as acute or chronic: Secondary | ICD-10-CM

## 2013-05-03 DIAGNOSIS — N179 Acute kidney failure, unspecified: Secondary | ICD-10-CM

## 2013-05-03 DIAGNOSIS — E1149 Type 2 diabetes mellitus with other diabetic neurological complication: Secondary | ICD-10-CM | POA: Diagnosis present

## 2013-05-03 DIAGNOSIS — Z7901 Long term (current) use of anticoagulants: Secondary | ICD-10-CM

## 2013-05-03 DIAGNOSIS — R609 Edema, unspecified: Secondary | ICD-10-CM

## 2013-05-03 DIAGNOSIS — G4733 Obstructive sleep apnea (adult) (pediatric): Secondary | ICD-10-CM | POA: Diagnosis present

## 2013-05-03 DIAGNOSIS — I517 Cardiomegaly: Secondary | ICD-10-CM | POA: Diagnosis present

## 2013-05-03 DIAGNOSIS — I509 Heart failure, unspecified: Secondary | ICD-10-CM | POA: Diagnosis present

## 2013-05-03 HISTORY — DX: Malignant neoplasm of uterus, part unspecified: C55

## 2013-05-03 LAB — URINALYSIS, ROUTINE W REFLEX MICROSCOPIC
Glucose, UA: NEGATIVE mg/dL
Ketones, ur: NEGATIVE mg/dL
Specific Gravity, Urine: 1.015 (ref 1.005–1.030)
pH: 6 (ref 5.0–8.0)

## 2013-05-03 LAB — PROTIME-INR
INR: 3.23 — ABNORMAL HIGH (ref 0.00–1.49)
Prothrombin Time: 31.2 seconds — ABNORMAL HIGH (ref 11.6–15.2)

## 2013-05-03 LAB — LACTIC ACID, PLASMA: Lactic Acid, Venous: 1.3 mmol/L (ref 0.5–2.2)

## 2013-05-03 LAB — CBC WITH DIFFERENTIAL/PLATELET
Basophils Absolute: 0 10*3/uL (ref 0.0–0.1)
Basophils Relative: 0 % (ref 0–1)
Eosinophils Relative: 1 % (ref 0–5)
Lymphocytes Relative: 7 % — ABNORMAL LOW (ref 12–46)
Neutro Abs: 6.2 10*3/uL (ref 1.7–7.7)
Platelets: 176 10*3/uL (ref 150–400)
RDW: 16.4 % — ABNORMAL HIGH (ref 11.5–15.5)
WBC: 7.4 10*3/uL (ref 4.0–10.5)

## 2013-05-03 LAB — COMPREHENSIVE METABOLIC PANEL
ALT: 13 U/L (ref 0–35)
AST: 19 U/L (ref 0–37)
Albumin: 3.8 g/dL (ref 3.5–5.2)
CO2: 32 mEq/L (ref 19–32)
Calcium: 9.9 mg/dL (ref 8.4–10.5)
Chloride: 100 mEq/L (ref 96–112)
GFR calc non Af Amer: 81 mL/min — ABNORMAL LOW (ref >90)
Sodium: 139 mEq/L (ref 135–145)

## 2013-05-03 LAB — GLUCOSE, CAPILLARY
Glucose-Capillary: 187 mg/dL — ABNORMAL HIGH (ref 70–99)
Glucose-Capillary: 210 mg/dL — ABNORMAL HIGH (ref 70–99)

## 2013-05-03 LAB — URINE MICROSCOPIC-ADD ON

## 2013-05-03 MED ORDER — ADULT MULTIVITAMIN W/MINERALS CH
1.0000 | ORAL_TABLET | Freq: Every day | ORAL | Status: DC
  Administered 2013-05-03 – 2013-05-14 (×12): 1 via ORAL
  Filled 2013-05-03 (×12): qty 1

## 2013-05-03 MED ORDER — MECLIZINE HCL 12.5 MG PO TABS
12.5000 mg | ORAL_TABLET | Freq: Three times a day (TID) | ORAL | Status: DC | PRN
  Administered 2013-05-04 – 2013-05-11 (×5): 12.5 mg via ORAL
  Filled 2013-05-03 (×4): qty 1

## 2013-05-03 MED ORDER — ACETAMINOPHEN 650 MG RE SUPP: 650.0000 mg | Freq: Four times a day (QID) | RECTAL | Status: DC | PRN

## 2013-05-03 MED ORDER — SIMVASTATIN 40 MG PO TABS: 40.0000 mg | ORAL_TABLET | Freq: Every day | ORAL | Status: DC

## 2013-05-03 MED ORDER — EXEMESTANE 25 MG PO TABS
25.0000 mg | ORAL_TABLET | Freq: Every day | ORAL | Status: DC
  Administered 2013-05-03 – 2013-05-14 (×12): 25 mg via ORAL
  Filled 2013-05-03 (×16): qty 1

## 2013-05-03 MED ORDER — IOHEXOL 300 MG/ML  SOLN
80.0000 mL | Freq: Once | INTRAMUSCULAR | Status: AC | PRN
  Administered 2013-05-03: 80 mL via INTRAVENOUS

## 2013-05-03 MED ORDER — ONDANSETRON HCL 4 MG/2ML IJ SOLN
4.0000 mg | Freq: Once | INTRAMUSCULAR | Status: AC
  Administered 2013-05-03: 4 mg via INTRAVENOUS
  Filled 2013-05-03: qty 2

## 2013-05-03 MED ORDER — POLYETHYLENE GLYCOL 3350 17 G PO PACK
17.0000 g | PACK | Freq: Every day | ORAL | Status: DC | PRN
  Administered 2013-05-04 – 2013-05-06 (×4): 17 g via ORAL
  Filled 2013-05-03 (×3): qty 1

## 2013-05-03 MED ORDER — METHYLPREDNISOLONE SODIUM SUCC 125 MG IJ SOLR
80.0000 mg | Freq: Four times a day (QID) | INTRAMUSCULAR | Status: DC
  Administered 2013-05-03 – 2013-05-04 (×3): 80 mg via INTRAVENOUS
  Filled 2013-05-03 (×7): qty 1.28

## 2013-05-03 MED ORDER — METFORMIN HCL 500 MG PO TABS
500.0000 mg | ORAL_TABLET | Freq: Two times a day (BID) | ORAL | Status: DC
  Administered 2013-05-04 – 2013-05-05 (×3): 500 mg via ORAL
  Filled 2013-05-03 (×7): qty 1

## 2013-05-03 MED ORDER — INSULIN GLARGINE 100 UNIT/ML ~~LOC~~ SOLN
20.0000 [IU] | Freq: Every day | SUBCUTANEOUS | Status: DC
  Administered 2013-05-03: 20 [IU] via SUBCUTANEOUS
  Filled 2013-05-03 (×2): qty 0.2

## 2013-05-03 MED ORDER — LORAZEPAM 0.5 MG PO TABS
0.5000 mg | ORAL_TABLET | Freq: Every evening | ORAL | Status: DC | PRN
  Administered 2013-05-04 – 2013-05-14 (×11): 0.5 mg via ORAL
  Filled 2013-05-03 (×11): qty 1

## 2013-05-03 MED ORDER — COLCHICINE 0.6 MG PO TABS
0.6000 mg | ORAL_TABLET | Freq: Two times a day (BID) | ORAL | Status: DC
  Administered 2013-05-03 – 2013-05-14 (×22): 0.6 mg via ORAL
  Filled 2013-05-03 (×23): qty 1

## 2013-05-03 MED ORDER — POTASSIUM CHLORIDE CRYS ER 20 MEQ PO TBCR
20.0000 meq | EXTENDED_RELEASE_TABLET | Freq: Two times a day (BID) | ORAL | Status: DC
  Administered 2013-05-03 – 2013-05-07 (×9): 20 meq via ORAL
  Administered 2013-05-08: 21:00:00 via ORAL
  Administered 2013-05-08 – 2013-05-14 (×12): 20 meq via ORAL
  Filled 2013-05-03 (×26): qty 1

## 2013-05-03 MED ORDER — INSULIN ASPART 100 UNIT/ML ~~LOC~~ SOLN
0.0000 [IU] | Freq: Three times a day (TID) | SUBCUTANEOUS | Status: DC
  Administered 2013-05-04: 11 [IU] via SUBCUTANEOUS
  Administered 2013-05-04: 8 [IU] via SUBCUTANEOUS
  Administered 2013-05-04 – 2013-05-05 (×4): 15 [IU] via SUBCUTANEOUS
  Administered 2013-05-06: 8 [IU] via SUBCUTANEOUS
  Administered 2013-05-06 (×2): 11 [IU] via SUBCUTANEOUS
  Administered 2013-05-07 (×3): 8 [IU] via SUBCUTANEOUS
  Administered 2013-05-08: 5 [IU] via SUBCUTANEOUS
  Administered 2013-05-08: 8 [IU] via SUBCUTANEOUS
  Administered 2013-05-08 – 2013-05-09 (×4): 3 [IU] via SUBCUTANEOUS
  Administered 2013-05-10: 15 [IU] via SUBCUTANEOUS
  Administered 2013-05-10 – 2013-05-11 (×2): 5 [IU] via SUBCUTANEOUS
  Administered 2013-05-11: 11 [IU] via SUBCUTANEOUS
  Administered 2013-05-11: 2 [IU] via SUBCUTANEOUS
  Administered 2013-05-12: 11 [IU] via SUBCUTANEOUS
  Administered 2013-05-12: 2 [IU] via SUBCUTANEOUS
  Administered 2013-05-12: 11 [IU] via SUBCUTANEOUS
  Administered 2013-05-13: 8 [IU] via SUBCUTANEOUS
  Administered 2013-05-13: 5 [IU] via SUBCUTANEOUS
  Administered 2013-05-13 – 2013-05-14 (×3): 3 [IU] via SUBCUTANEOUS

## 2013-05-03 MED ORDER — SODIUM CHLORIDE 0.9 % IV SOLN
250.0000 mL | INTRAVENOUS | Status: DC | PRN
Start: 2013-05-03 — End: 2013-05-14

## 2013-05-03 MED ORDER — INSULIN ASPART 100 UNIT/ML ~~LOC~~ SOLN
0.0000 [IU] | Freq: Every day | SUBCUTANEOUS | Status: DC
  Administered 2013-05-03 – 2013-05-04 (×2): 4 [IU] via SUBCUTANEOUS
  Administered 2013-05-05: 5 [IU] via SUBCUTANEOUS
  Administered 2013-05-06: 23:00:00 via SUBCUTANEOUS
  Administered 2013-05-07: 5 [IU] via SUBCUTANEOUS
  Administered 2013-05-08: 4 [IU] via SUBCUTANEOUS
  Administered 2013-05-09: 5 [IU] via SUBCUTANEOUS
  Administered 2013-05-10: 4 [IU] via SUBCUTANEOUS
  Administered 2013-05-12: 3 [IU] via SUBCUTANEOUS
  Administered 2013-05-13: 5 [IU] via SUBCUTANEOUS

## 2013-05-03 MED ORDER — ONDANSETRON HCL 4 MG PO TABS: 4.0000 mg | ORAL_TABLET | Freq: Four times a day (QID) | ORAL | Status: DC | PRN

## 2013-05-03 MED ORDER — IOHEXOL 300 MG/ML  SOLN
50.0000 mL | Freq: Once | INTRAMUSCULAR | Status: AC | PRN
  Administered 2013-05-03: 50 mL via ORAL

## 2013-05-03 MED ORDER — SODIUM CHLORIDE 0.9 % IJ SOLN
3.0000 mL | Freq: Two times a day (BID) | INTRAMUSCULAR | Status: DC
  Administered 2013-05-06 – 2013-05-14 (×10): 3 mL via INTRAVENOUS

## 2013-05-03 MED ORDER — TORSEMIDE 20 MG PO TABS
20.0000 mg | ORAL_TABLET | Freq: Two times a day (BID) | ORAL | Status: DC
  Administered 2013-05-03 – 2013-05-11 (×16): 20 mg via ORAL
  Filled 2013-05-03 (×19): qty 1

## 2013-05-03 MED ORDER — ALUM & MAG HYDROXIDE-SIMETH 200-200-20 MG/5ML PO SUSP: 30.0000 mL | Freq: Four times a day (QID) | ORAL | Status: DC | PRN

## 2013-05-03 MED ORDER — FERROUS GLUCONATE IRON 246 (28 FE) MG PO TABS: 246.0000 mg | ORAL_TABLET | Freq: Every day | ORAL | Status: DC

## 2013-05-03 MED ORDER — SODIUM CHLORIDE 0.9 % IV SOLN: INTRAVENOUS | Status: AC

## 2013-05-03 MED ORDER — ONDANSETRON HCL 4 MG/2ML IJ SOLN: 4.0000 mg | Freq: Three times a day (TID) | INTRAMUSCULAR | Status: DC | PRN

## 2013-05-03 MED ORDER — SODIUM CHLORIDE 0.9 % IV BOLUS (SEPSIS)
1000.0000 mL | Freq: Once | INTRAVENOUS | Status: AC
  Administered 2013-05-03: 1000 mL via INTRAVENOUS

## 2013-05-03 MED ORDER — ACETAMINOPHEN 325 MG PO TABS: 650.0000 mg | ORAL_TABLET | Freq: Four times a day (QID) | ORAL | Status: DC | PRN

## 2013-05-03 MED ORDER — SODIUM CHLORIDE 0.9 % IJ SOLN
3.0000 mL | INTRAMUSCULAR | Status: DC | PRN
  Administered 2013-05-05 – 2013-05-12 (×2): 3 mL via INTRAVENOUS

## 2013-05-03 MED ORDER — HYDROCODONE-ACETAMINOPHEN 5-325 MG PO TABS
1.0000 | ORAL_TABLET | Freq: Four times a day (QID) | ORAL | Status: DC | PRN
  Administered 2013-05-11 – 2013-05-14 (×5): 1 via ORAL
  Filled 2013-05-03 (×6): qty 1

## 2013-05-03 MED ORDER — VITAMIN D3 25 MCG (1000 UNIT) PO TABS
1000.0000 [IU] | ORAL_TABLET | Freq: Every day | ORAL | Status: DC
  Administered 2013-05-03 – 2013-05-14 (×12): 1000 [IU] via ORAL
  Filled 2013-05-03 (×12): qty 1

## 2013-05-03 MED ORDER — FERROUS GLUCONATE 324 (38 FE) MG PO TABS
324.0000 mg | ORAL_TABLET | Freq: Every day | ORAL | Status: DC
  Administered 2013-05-04 – 2013-05-14 (×11): 324 mg via ORAL
  Filled 2013-05-03 (×14): qty 1

## 2013-05-03 MED ORDER — METOPROLOL TARTRATE 25 MG PO TABS
25.0000 mg | ORAL_TABLET | Freq: Two times a day (BID) | ORAL | Status: DC
  Administered 2013-05-03 – 2013-05-14 (×22): 25 mg via ORAL
  Filled 2013-05-03 (×23): qty 1

## 2013-05-03 MED ORDER — ONDANSETRON HCL 4 MG/2ML IJ SOLN
4.0000 mg | Freq: Four times a day (QID) | INTRAMUSCULAR | Status: DC | PRN
  Administered 2013-05-05 – 2013-05-11 (×2): 4 mg via INTRAVENOUS
  Filled 2013-05-03 (×3): qty 2

## 2013-05-03 MED ORDER — CALCIUM CARBONATE-VITAMIN D 500-200 MG-UNIT PO TABS
1.0000 | ORAL_TABLET | Freq: Two times a day (BID) | ORAL | Status: DC
  Administered 2013-05-03 – 2013-05-14 (×22): 1 via ORAL
  Filled 2013-05-03 (×23): qty 1

## 2013-05-03 MED ORDER — THEOPHYLLINE ER 200 MG PO TB12
200.0000 mg | ORAL_TABLET | Freq: Two times a day (BID) | ORAL | Status: DC
  Administered 2013-05-03 – 2013-05-14 (×22): 200 mg via ORAL
  Filled 2013-05-03 (×23): qty 1

## 2013-05-03 MED ORDER — LEVALBUTEROL HCL 0.63 MG/3ML IN NEBU
0.6300 mg | INHALATION_SOLUTION | Freq: Four times a day (QID) | RESPIRATORY_TRACT | Status: DC
  Administered 2013-05-03 – 2013-05-06 (×11): 0.63 mg via RESPIRATORY_TRACT
  Filled 2013-05-03 (×22): qty 3

## 2013-05-03 MED ORDER — SODIUM CHLORIDE 0.9 % IJ SOLN
3.0000 mL | Freq: Two times a day (BID) | INTRAMUSCULAR | Status: DC
  Administered 2013-05-05 – 2013-05-09 (×7): 3 mL via INTRAVENOUS

## 2013-05-03 MED ORDER — SODIUM CHLORIDE 0.9 % IV SOLN
INTRAVENOUS | Status: DC
  Administered 2013-05-03: 18:00:00 via INTRAVENOUS

## 2013-05-03 MED ORDER — ALLOPURINOL 100 MG PO TABS
200.0000 mg | ORAL_TABLET | Freq: Every day | ORAL | Status: DC
  Administered 2013-05-03 – 2013-05-14 (×12): 200 mg via ORAL
  Filled 2013-05-03 (×12): qty 2

## 2013-05-03 MED ORDER — IPRATROPIUM BROMIDE 0.02 % IN SOLN
0.5000 mg | Freq: Four times a day (QID) | RESPIRATORY_TRACT | Status: DC
  Administered 2013-05-03 – 2013-05-06 (×11): 0.5 mg via RESPIRATORY_TRACT
  Filled 2013-05-03 (×11): qty 2.5

## 2013-05-03 MED ORDER — SIMVASTATIN 40 MG PO TABS
40.0000 mg | ORAL_TABLET | Freq: Every day | ORAL | Status: DC
  Administered 2013-05-03 – 2013-05-13 (×11): 40 mg via ORAL
  Filled 2013-05-03 (×12): qty 1

## 2013-05-03 MED ORDER — DILTIAZEM HCL ER BEADS 240 MG PO CP24
360.0000 mg | ORAL_CAPSULE | Freq: Every day | ORAL | Status: DC
  Administered 2013-05-03 – 2013-05-04 (×2): 360 mg via ORAL
  Filled 2013-05-03 (×3): qty 1

## 2013-05-03 NOTE — Progress Notes (Signed)
Spoke with pt regarding cpap.  Pt stated she will not be ready for cpap until late tonight.  Pt was advised that RT available all night and encouraged her to call/let her nurse know when she is ready.

## 2013-05-03 NOTE — H&P (Signed)
[facility] History and Physical  [name] WUJ:[id number] DOB: Feb 26, 1941 DOA: 05/03/2013  Referring physician: Dr. Glynn Octave PCP: [name], MD   Chief Complaint: Pre-syncope, nausea and vomiting.   History of Present Illness: [name] is an 72 y.o. female with a PMH of atrial fibrillation on chronic coumadin, DM, sarcoid, and morbid obesity who presented to the hospital of 05/03/13 with the acute onset of near syncope, nausea and vomiting while driving to church.  No hematemesis, bile colored vomitus.  Had a loose bowel movement last evening, but no frank diarrhea.  Patient also endorses progressive dyspnea and a cough associated with postnasal drainage. She notes that she has been wheezing today. No recent illnesses or sick contacts.  No recent antibiotic use.  Feels better now after having received Zofran in the emergency department.    Review of Systems: Constitutional: No fever, no chills;  Appetite normal; No weight loss, no weight gain.  HEENT: No blurry vision, no diplopia, no pharyngitis, no dysphagia CV: No chest pain, no palpitations.  Resp: + SOB, + cough. GI: + nausea and vomiting, no diarrhea, no melena, no hematochezia.  GU: No dysuria, no hematuria.  MSK: no myalgias, no arthralgias.  Neuro:  No headache, no focal neurological deficits, no history of seizures.  Psych: No depression, no anxiety.  Endo: No thyroid disease, + DM, no heat intolerance,+ cold intolerance, no polyuria, no polydipsia  Skin: No rashes, no skin lesions.  Heme: + easy bruising, no history of blood diseases.  Past Medical History Past Medical History  Diagnosis Date  . Diabetes mellitus   . Asthma   . Bronchitis   . Hernia   . Anemia   . Uterine cancer     s/p hysterectomy  . Arthritis   . Hernia   . Morbid obesity   . Sarcoid   . OSA (obstructive sleep apnea)     Uses nasal CPAP Q HS  . Atrial fibrillation   . Breast cancer, stage 1 10/17/2011    s/p lumpectomy and  XRT  . Cough   . Wheezing   . Chills   . Constipation   . Bruises easily   . Gout attack     ankle, then wrist and hands  . Hyperlipidemia      Past Surgical History Past Surgical History  Procedure Laterality Date  . Total knee arthroplasty  2001  . Abdominal hysterectomy  2006  . Resection of uterine cancer    . Left lower leg fx--casted    . Breast lumpectomy Right      Social History: History   Social History  . Marital Status: Divorced    Spouse Name: N/A    Number of Children: N/A  . Years of Education: N/A   Occupational History  . RETIRED     [employer]   Social History Main Topics  . Smoking status: Never Smoker   . Smokeless tobacco: Never Used  . Alcohol Use: No  . Drug Use: No  . Sexually Active: Yes    Birth Control/ Protection: Post-menopausal, Surgical   Other Topics Concern  . Not on file   Social History Narrative   Widowed.  Lives alone.  Ambulates with a walker.    Family History:  Family History  Problem Relation Age of Onset  . Diabetes Mother   . Cancer Father     colon  . Diabetes Brother   . Cancer Sister   . Diabetes Sister  Allergies: Aspirin; Ibuprofen; Pravachol; and Pravastatin sodium  Meds: Prior to Admission medications   Medication Sig Start Date End Date Taking? Authorizing Provider  acetaminophen (TYLENOL) 500 MG tablet Take 1,000 mg by mouth every 6 (six) hours as needed. pain   Yes Historical Provider, MD  alendronate (FOSAMAX) 70 MG tablet Take 70 mg by mouth every 7 (seven) days. Take with a full glass of water on an empty stomach. Do not sit for 30 minutes.  Takes on saturday 04/05/13  Yes Victorino December, MD  allopurinol (ZYLOPRIM) 100 MG tablet Take 200 mg by mouth daily.  11/04/12  Yes Historical Provider, MD  Calcium Carbonate-Vitamin D (CALCIUM 600 + D PO) Take 1 tablet by mouth 2 (two) times daily.    Yes Historical Provider, MD  cholecalciferol (VITAMIN D) 1000 UNITS tablet Take 1 tablet (1,000 Units  total) by mouth daily. 08/08/12 08/08/13 Yes Victorino December, MD  colchicine 0.6 MG tablet Take 1 tablet (0.6 mg total) by mouth 2 (two) times daily. 11/22/12  Yes [name], MD  diltiazem (TIAZAC) 360 MG 24 hr capsule Take 1 tablet by mouth daily.  06/06/11  Yes Historical Provider, MD  exemestane (AROMASIN) 25 MG tablet Take 1 tablet (25 mg total) by mouth daily after breakfast. 03/08/13  Yes Victorino December, MD  HUMULIN N 100 UNIT/ML injection Inject 5 Units into the skin as needed (5 units if blood sugar is greater than 150).  02/05/13  Yes Historical Provider, MD  HYDROcodone-acetaminophen (NORCO/VICODIN) 5-325 MG per tablet Take 1 tablet by mouth every 6 (six) hours as needed. For pain. 11/22/12  Yes [name], MD  insulin glargine (LANTUS) 100 UNIT/ML injection Inject 20 Units into the skin at bedtime. Pt only uses if cbg is greater than 150   Yes Historical Provider, MD  ipratropium (ATROVENT) 0.02 % nebulizer solution Take 2.5 mLs (0.5 mg total) by nebulization every 6 (six) hours. 11/22/12  Yes [name], MD  levalbuterol (XOPENEX) 0.63 MG/3ML nebulizer solution Take 3 mLs (0.63 mg total) by nebulization every 6 (six) hours. 11/22/12  Yes [name], MD  LORazepam (ATIVAN) 0.5 MG tablet Take 0.5 mg by mouth at bedtime as needed. For sleep.   Yes Historical Provider, MD  meclizine (ANTIVERT) 25 MG tablet Take 6.25-12.5 mg by mouth 3 (three) times daily as needed. For dizziness.   Yes Historical Provider, MD  metFORMIN (GLUCOPHAGE) 500 MG tablet Take 500 mg by mouth 2 (two) times daily with a meal. If cbg is greater than 120 pt takes medication   Yes Historical Provider, MD  metoprolol tartrate (LOPRESSOR) 25 MG tablet Take 25 mg by mouth 2 (two) times daily.  05/13/11  Yes Historical Provider, MD  Multiple Vitamin (MULTIVITAMIN WITH MINERALS) TABS Take 1 tablet by mouth daily.   Yes Historical Provider, MD  polyethylene glycol (MIRALAX / GLYCOLAX) packet Take 17 g by  mouth daily. 11/22/12  Yes [name], MD  potassium chloride SA (K-DUR,KLOR-CON) 20 MEQ tablet Take 20 mEq by mouth 2 (two) times daily.  02/11/13  Yes Historical Provider, MD  promethazine (PHENERGAN) 25 MG tablet Take 25 mg by mouth every 6 (six) hours as needed. For nausea.   Yes Historical Provider, MD  simvastatin (ZOCOR) 40 MG tablet Take 40 mg by mouth at bedtime.   Yes Historical Provider, MD  theophylline (THEODUR) 200 MG 12 hr tablet Take 200 mg by mouth 2 (two) times daily.   Yes Historical  Provider, MD  torsemide (DEMADEX) 20 MG tablet Take 20 mg by mouth 2 (two) times daily.   Yes Historical Provider, MD  warfarin (COUMADIN) 4 MG tablet Take 4-6 mg by mouth daily. Take 1 tab daily except for 1.5 tab on MWF.   Yes Historical Provider, MD  ferrous gluconate (FERGON) 246 (28 FE) MG tablet Take 1 tablet (246 mg total) by mouth daily with breakfast. 10/17/11 12/02/12  Victorino December, MD  predniSONE (DELTASONE) 10 MG tablet Take 3 tablets for 3 days then 2 tablet for 3 days then 1 tablet for one day then stop. 11/22/12   Alba Cory, MD    Physical Exam: Filed Vitals:   05/03/13 1306 05/03/13 1404 05/03/13 1406 05/03/13 1414  BP:  102/56 100/56 99/75  Pulse:  60 64 75  SpO2: 100%        Physical Exam: Blood pressure 99/75, pulse 75, SpO2 100.00%. Gen: No acute distress. Obese. Head: Normocephalic, atraumatic. Eyes: PERRL, EOMI, sclerae nonicteric. Mouth: Oropharynx with dry mucous membranes. Edentulous to the upper jaw. Neck: Supple, no thyromegaly, no lymphadenopathy, no jugular venous distention. Chest: Lungs with decreased air movement, expiratory wheezes throughout. CV: Heart sounds are irregularly irregular. No murmurs, rubs, or gallops. Abdomen: Soft, nontender, nondistended with normal active bowel sounds. Negative Murphy sign. Extremities: Extremities with 1+ edema bilaterally. Left calf appears larger than right calf. No Homans sign. Skin: Warm and  dry. Neuro: Alert and oriented times 3; cranial nerves II through XII grossly intact. Psych: Mood and affect normal.  Labs on Admission:  Basic Metabolic Panel:  Recent Labs Lab 05/03/13 1336  NA 139  K 3.6  CL 100  CO2 32  GLUCOSE 200*  BUN 18  CREATININE 0.77  CALCIUM 9.9   Liver Function Tests:  Recent Labs Lab 05/03/13 1336  AST 19  ALT 13  ALKPHOS 82  BILITOT 0.6  PROT 7.1  ALBUMIN 3.8    Recent Labs Lab 05/03/13 1336  LIPASE 27   CBC:  Recent Labs Lab 05/03/13 1336  WBC 7.4  NEUTROABS 6.2  HGB 13.4  HCT 40.4  MCV 84.0  PLT 176   Cardiac Enzymes:  Recent Labs Lab 05/03/13 1336  TROPONINI <0.30   Radiological Exams on Admission: US Abdomen Complete  05/03/2013   *RADIOLOGY REPORT*  Clinical Data:  History of gallstones, vomiting  COMPLETE ABDOMINAL ULTRASOUND  Comparison:  None.  Findings:  Gallbladder:  Echogenic foci in the gallbladder neck consistent with a gallstone which measures 1.4 x 0.4 cm.  No significant gallbladder wall thickening.  No pericholecystic fluid.  The gallbladder is not distended.  Per the sonographer, the sonographic Murphy's sign was negative.  Common bile duct:  Within normal limits at 6 mm in caliber.  Liver:  No focal lesion identified.  Within normal limits in parenchymal echogenicity.  IVC:  Infrahepatic IVC is obscured by bowel gas.  The suprahepatic IVC is unremarkable.  Pancreas:  No focal abnormality seen.  Spleen:  The spleen is enlarged at 14.8 x 8 16.3 x 8.8 cm yielding a total splenic volume of 1106 ml.  No focal lesion identified.  Right Kidney:  Normal reniform contour without hydronephrosis or focal solid renal lesion.  The kidney measures 10.7 cm in length.  Left Kidney:  Normal reniform contour without hydronephrosis or focal solid renal lesion.  The kidney measures 11 cm in length.  Abdominal aorta:  No aneurysm identified.  IMPRESSION:  1.  Cholelithiasis without sonographic features to suggest  acute  cholecystitis.  2.  Splenomegaly without focal splenic lesion.   Original Report Authenticated By: Malachy Moan, M.D.   Dg Abd Acute W/chest  05/03/2013   *RADIOLOGY REPORT*  Clinical Data: Nausea and vomiting.  Current history of diabetes, asthma, breast cancer, and atrial fibrillation.  ACUTE ABDOMEN SERIES (ABDOMEN 2 VIEW & CHEST 1 VIEW)  Comparison: No prior abdominal imaging.  Two-view chest x-ray 11/19/2012, 12/15/2011.  Findings: Bowel gas pattern unremarkable without evidence of obstruction or significant ileus.  No evidence of free intraperitoneal air on the lateral decubitus image.  5 very small round calcifications clustered in the right upper quadrant.  No abnormal calcifications elsewhere.  Surgical clips in the pelvis. Degenerative changes involving the lumbar spine and generalized osseous demineralization.  Cardiac silhouette mildly enlarged but stable.  Prominent bronchovascular markings diffusely and moderate to marked central peribronchial thickening, similar to prior examinations.  No new pulmonary parenchymal abnormalities.  IMPRESSION:  1.  No acute abdominal abnormality. 2.  Probable small gallstones in the right upper quadrant of the abdomen. 3.  Stable mild cardiomegaly and moderate to severe changes of chronic bronchitis and/or asthma.  No acute cardiopulmonary disease.   Original Report Authenticated By: Hulan Saas, M.D.    EKG: Independently reviewed. A true fibrillation at 82 beats per minute. No ST-T wave abnormalities appreciated.  Assessment/Plan Principal Problem:   Near syncope associated with nausea and vomiting / COPD exacerbation -Suspect near syncope was vasovagal in nature from transient nausea/vomiting. -Status post abdominal films and abdominal ultrasound which showed cholelithiasis without evidence of cholecystitis. -Blood pressure slightly lower than usual baseline. -Monitor on telemetry. -On exam, patient with prominent wheezing and decreased air  movement consistent with COPD exacerbation which may have contributed to her presenting symptoms. -Start on Solu-Medrol 80 mg IV every 6 hours and taper as respiratory status improves. -Continue nebulized bronchodilator therapy. -Supplemental oxygen as needed. Active Problems:   PULMONARY SARCOIDOSIS -Non-steroid-dependent per patient.   Atrial fibrillation -Rate controlled, on Coumadin, Cardizem, and beta blocker.   Obstructive sleep apnea / Obesity hypoventilation syndrome -Continue CPAP each bedtime.   Gout -Continue allopurinol and colchicine.   Diabetes mellitus -Continue Lantus and start moderate sliding scale insulin. -Watch glycemic control with steroid administration.   Hyperlipidemia -Continue statin.  Code Status: Full. Family Communication: None at bedside. Disposition Plan: Home when stable.  Time spent: 1 hour.  RAMA,CHRISTINA [facility] Pager [phone]  If 7PM-7AM, please contact night-coverage [email or web address] Password [email or web address] 05/03/2013, 5:20 PM

## 2013-05-03 NOTE — ED Provider Notes (Signed)
History     CSN: [id number]  Arrival date & time 05/03/13  1304   First MD Initiated Contact with Patient 05/03/13 1325      Chief Complaint  Patient presents with  . Nausea  . Emesis    (Consider location/radiation/quality/duration/timing/severity/associated sxs/prior treatment) HPI Comments: Patient presents with acute onset of near syncope, nausea and vomiting. She states she was driving to church when she all of a sudden began to feel lightheaded and nauseated. She had 2 episodes of vomiting. Concern that she was going to pass out. She denies any chest pain, shortness of breath, fever chills. She admits to not eating much for breakfast or lunch today. She is a diabetic and did take her insulin. She has a history of a fibrillation on Coumadin. She states after receiving Zofran she is feeling better but still feeling weak and lightheaded. Denies any focal weakness, numbness or tingling. Denies any recent illnesses. Denies any recent sick contacts, travel or antibiotic use.  The history is provided by the patient, the EMS personnel and a relative.    Past Medical History  Diagnosis Date  . Diabetes mellitus   . Asthma   . Bronchitis   . Hernia   . Anemia   . Cancer   . Arthritis   . Hernia   . Morbid obesity   . Sarcoid   . OSA (obstructive sleep apnea)   . Atrial fibrillation   . Breast cancer, stage 1 10/17/2011  . Cough   . Wheezing   . Chills   . Atrial fibrillation   . Constipation   . Bruises easily   . Gout attack     ankle, then wrist and hands    Past Surgical History  Procedure Laterality Date  . Total knee arthroplasty  2001  . Abdominal hysterectomy  2006  . Resection of uterine cancer    . Left lower leg fx--casted    . Breast surgery      Family History  Problem Relation Age of Onset  . Diabetes Mother   . Cancer Father     colon  . Diabetes Brother   . Cancer Sister   . Diabetes Sister     History  Substance Use Topics  . Smoking  status: Never Smoker   . Smokeless tobacco: Never Used  . Alcohol Use: No    OB History   Grav Para Term Preterm Abortions TAB SAB Ect Mult Living                  Review of Systems  Constitutional: Positive for activity change, appetite change and fatigue. Negative for fever.  HENT: Negative for congestion and rhinorrhea.   Respiratory: Negative for cough, chest tightness and shortness of breath.   Cardiovascular: Negative for chest pain.  Gastrointestinal: Positive for vomiting. Negative for nausea and abdominal pain.  Genitourinary: Negative for dysuria, hematuria, vaginal bleeding and vaginal discharge.  Musculoskeletal: Negative for back pain.  Skin: Negative for rash.  Neurological: Positive for dizziness, weakness and light-headedness. Negative for syncope, numbness and headaches.  A complete 10 system review of systems was obtained and all systems are negative except as noted in the HPI and PMH.    Allergies  Pravachol and Pravastatin sodium  Home Medications   Current Outpatient Rx  Name  Route  Sig  Dispense  Refill  . acetaminophen (TYLENOL) 500 MG tablet   Oral   Take 1,000 mg by mouth every 6 (six) hours as needed.  pain         . alendronate (FOSAMAX) 70 MG tablet   Oral   Take 70 mg by mouth every 7 (seven) days. Take with a full glass of water on an empty stomach. Do not sit for 30 minutes.  Takes on saturday         . allopurinol (ZYLOPRIM) 100 MG tablet   Oral   Take 200 mg by mouth daily.          . Calcium Carbonate-Vitamin D (CALCIUM 600 + D PO)   Oral   Take 1 tablet by mouth 2 (two) times daily.          . cholecalciferol (VITAMIN D) 1000 UNITS tablet   Oral   Take 1 tablet (1,000 Units total) by mouth daily.   30 tablet   12   . colchicine 0.6 MG tablet   Oral   Take 1 tablet (0.6 mg total) by mouth 2 (two) times daily.   60 tablet   0   . diltiazem (TIAZAC) 360 MG 24 hr capsule   Oral   Take 1 tablet by mouth daily.           Marland Kitchen exemestane (AROMASIN) 25 MG tablet   Oral   Take 1 tablet (25 mg total) by mouth daily after breakfast.   30 tablet   12   . HUMULIN N 100 UNIT/ML injection   Subcutaneous   Inject 5 Units into the skin as needed (5 units if blood sugar is greater than 150).          Marland Kitchen HYDROcodone-acetaminophen (NORCO/VICODIN) 5-325 MG per tablet   Oral   Take 1 tablet by mouth every 6 (six) hours as needed. For pain.   30 tablet   0   . insulin glargine (LANTUS) 100 UNIT/ML injection   Subcutaneous   Inject 20 Units into the skin at bedtime. Pt only uses if cbg is greater than 150         . ipratropium (ATROVENT) 0.02 % nebulizer solution   Nebulization   Take 2.5 mLs (0.5 mg total) by nebulization every 6 (six) hours.   75 mL   0   . levalbuterol (XOPENEX) 0.63 MG/3ML nebulizer solution   Nebulization   Take 3 mLs (0.63 mg total) by nebulization every 6 (six) hours.   3 mL   0   . LORazepam (ATIVAN) 0.5 MG tablet   Oral   Take 0.5 mg by mouth at bedtime as needed. For sleep.         . meclizine (ANTIVERT) 25 MG tablet   Oral   Take 6.25-12.5 mg by mouth 3 (three) times daily as needed. For dizziness.         . metFORMIN (GLUCOPHAGE) 500 MG tablet   Oral   Take 500 mg by mouth 2 (two) times daily with a meal. If cbg is greater than 120 pt takes medication         . metoprolol tartrate (LOPRESSOR) 25 MG tablet   Oral   Take 25 mg by mouth 2 (two) times daily.          . Multiple Vitamin (MULTIVITAMIN WITH MINERALS) TABS   Oral   Take 1 tablet by mouth daily.         . polyethylene glycol (MIRALAX / GLYCOLAX) packet   Oral   Take 17 g by mouth daily.   14 each   0   . potassium chloride SA (  K-DUR,KLOR-CON) 20 MEQ tablet   Oral   Take 20 mEq by mouth 2 (two) times daily.          . promethazine (PHENERGAN) 25 MG tablet   Oral   Take 25 mg by mouth every 6 (six) hours as needed. For nausea.         . simvastatin (ZOCOR) 40 MG tablet   Oral    Take 40 mg by mouth at bedtime.         . theophylline (THEODUR) 200 MG 12 hr tablet   Oral   Take 200 mg by mouth 2 (two) times daily.         Marland Kitchen torsemide (DEMADEX) 20 MG tablet   Oral   Take 20 mg by mouth 2 (two) times daily.         Marland Kitchen warfarin (COUMADIN) 4 MG tablet   Oral   Take 4-6 mg by mouth daily. Take 1 tab daily except for 1.5 tab on MWF.         Marland Kitchen EXPIRED: ferrous gluconate (FERGON) 246 (28 FE) MG tablet   Oral   Take 1 tablet (246 mg total) by mouth daily with breakfast.   30 tablet   6   . predniSONE (DELTASONE) 10 MG tablet      Take 3 tablets for 3 days then 2 tablet for 3 days then 1 tablet for one day then stop.   16 tablet   0     BP 99/75  Pulse 75  SpO2 100%  Physical Exam  Constitutional: She is oriented to person, place, and time. She appears well-developed and well-nourished. No distress.  HENT:  Head: Normocephalic.  Mouth/Throat: Oropharynx is clear and moist. No oropharyngeal exudate.  Eyes: Conjunctivae and EOM are normal. Pupils are equal, round, and reactive to light.  Neck: Normal range of motion. Neck supple.  Cardiovascular: Normal rate and normal heart sounds.   No murmur heard. Irregular rhythm  Pulmonary/Chest: Effort normal. She has wheezes.  Abdominal: Soft. There is no tenderness. There is no rebound and no guarding.  Musculoskeletal: Normal range of motion. She exhibits no edema and no tenderness.  Neurological: She is alert and oriented to person, place, and time. No cranial nerve deficit. She exhibits normal muscle tone. Coordination normal.  CN 2-12 intact, no ataxia on finger to nose, no nystagmus, 5/5 strength throughout, no pronator drift, Romberg negative, normal gait.   Skin: Skin is warm.    ED Course  Procedures (including critical care time)  Labs Reviewed  CBC WITH DIFFERENTIAL - Abnormal; Notable for the following:    RDW 16.4 (*)    Neutrophils Relative % 84 (*)    Lymphocytes Relative 7 (*)     Lymphs Abs 0.5 (*)    All other components within normal limits  COMPREHENSIVE METABOLIC PANEL - Abnormal; Notable for the following:    Glucose, Bld 200 (*)    GFR calc non Af Amer 81 (*)    All other components within normal limits  PROTIME-INR - Abnormal; Notable for the following:    Prothrombin Time 31.2 (*)    INR 3.23 (*)    All other components within normal limits  LIPASE, BLOOD  TROPONIN I  LACTIC ACID, PLASMA  URINALYSIS, ROUTINE W REFLEX MICROSCOPIC   Dg Abd Acute W/chest  05/03/2013   *RADIOLOGY REPORT*  Clinical Data: Nausea and vomiting.  Current history of diabetes, asthma, breast cancer, and atrial fibrillation.  ACUTE ABDOMEN SERIES (ABDOMEN  2 VIEW & CHEST 1 VIEW)  Comparison: No prior abdominal imaging.  Two-view chest x-ray 11/19/2012, 12/15/2011.  Findings: Bowel gas pattern unremarkable without evidence of obstruction or significant ileus.  No evidence of free intraperitoneal air on the lateral decubitus image.  5 very small round calcifications clustered in the right upper quadrant.  No abnormal calcifications elsewhere.  Surgical clips in the pelvis. Degenerative changes involving the lumbar spine and generalized osseous demineralization.  Cardiac silhouette mildly enlarged but stable.  Prominent bronchovascular markings diffusely and moderate to marked central peribronchial thickening, similar to prior examinations.  No new pulmonary parenchymal abnormalities.  IMPRESSION:  1.  No acute abdominal abnormality. 2.  Probable small gallstones in the right upper quadrant of the abdomen. 3.  Stable mild cardiomegaly and moderate to severe changes of chronic bronchitis and/or asthma.  No acute cardiopulmonary disease.   Original Report Authenticated By: Hulan Saas, M.D.     No diagnosis found.    MDM  Acute onset of lightheadedness with nausea and vomiting.  No CP or SOB.  Abdomen soft and nonsurgical.  Blood pressure is only 100 systolic.  On record review, patient  has been 120-140s on clinic visits.  WBC normal.  LFTs and lipase normal. Neuro exam nonfocal.  Afib on EKG. INR 3.2.  Abdominal US obtained to evaluate GB and aorta.  Cholelithiasis without evidence of choleycystitis. No AAA.  Bp remains soft and below baseline.  Continue IV hydration and will attempt PO.  Given hypotension, will admit for observation. D/w Dr. Darnelle Catalan.      Date: 05/03/2013  Rate: 82  Rhythm: atrial fibrillation  QRS Axis: normal  Intervals: normal  ST/T Wave abnormalities: normal  Conduction Disutrbances:none  Narrative Interpretation:   Old EKG Reviewed: unchanged    Glynn Octave, MD 05/03/13 1746

## 2013-05-03 NOTE — Progress Notes (Signed)
ANTICOAGULATION CONSULT NOTE - Initial Consult  Pharmacy Consult for warfarin Indication: atrial fibrillation  Allergies  Allergen Reactions  . Aspirin     Avoids due to being on blood thinners  . Ibuprofen     Avoids due to being on blood thinners  . Pravachol Other (See Comments)    Muscle pain  . Pravastatin Sodium     Patient Measurements: Height: 4\' 11"  (149.9 cm) Weight: 256 lb 9.9 oz (116.4 kg) IBW/kg (Calculated) : 43.2 Heparin Dosing Weight:   Vital Signs: Temp: 97.7 F (36.5 C) (05/23 1746) Temp src: Oral (05/23 1746) BP: 139/64 mmHg (05/23 1746) Pulse Rate: 129 (05/23 1746)  Labs:  Recent Labs  05/03/13 1336  HGB 13.4  HCT 40.4  PLT 176  LABPROT 31.2*  INR 3.23*  CREATININE 0.77  TROPONINI <0.30    Estimated Creatinine Clearance: 71.7 ml/min (by C-G formula based on Cr of 0.77).   Medical History: Past Medical History  Diagnosis Date  . Diabetes mellitus   . Asthma   . Bronchitis   . Hernia   . Anemia   . Uterine cancer     s/p hysterectomy  . Arthritis   . Hernia   . Morbid obesity   . Sarcoid   . OSA (obstructive sleep apnea)     Uses nasal CPAP Q HS  . Atrial fibrillation   . Breast cancer, stage 1 10/17/2011    s/p lumpectomy and XRT  . Cough   . Wheezing   . Chills   . Constipation   . Bruises easily   . Gout attack     ankle, then wrist and hands  . Hyperlipidemia     Medications:  Scheduled:  . allopurinol  200 mg Oral Daily  . calcium-vitamin D  1 tablet Oral BID  . cholecalciferol  1,000 Units Oral Daily  . colchicine  0.6 mg Oral BID  . diltiazem  360 mg Oral Daily  . exemestane  25 mg Oral QPC breakfast  . [START ON 05/04/2013] ferrous gluconate  246 mg Oral Q breakfast  . [START ON 05/04/2013] insulin aspart  0-15 Units Subcutaneous TID WC  . insulin aspart  0-5 Units Subcutaneous QHS  . insulin glargine  20 Units Subcutaneous QHS  . ipratropium  0.5 mg Nebulization Q6H  . levalbuterol  0.63 mg Nebulization  Q6H  . [START ON 05/04/2013] metFORMIN  500 mg Oral BID WC  . methylPREDNISolone (SOLU-MEDROL) injection  80 mg Intravenous Q6H  . metoprolol tartrate  25 mg Oral BID  . multivitamin with minerals  1 tablet Oral Daily  . potassium chloride SA  20 mEq Oral BID  . simvastatin  40 mg Oral QHS  . sodium chloride  1,000 mL Intravenous Once  . sodium chloride  3 mL Intravenous Q12H  . sodium chloride  3 mL Intravenous Q12H  . theophylline  200 mg Oral BID  . torsemide  20 mg Oral BID   Infusions:  . sodium chloride     PRN: sodium chloride, acetaminophen, acetaminophen, alum & mag hydroxide-simeth, HYDROcodone-acetaminophen, LORazepam, meclizine, ondansetron (ZOFRAN) IV, ondansetron, polyethylene glycol, sodium chloride  Assessment: 72 yo F with history of chronic coumadin therapy for atrial fibrillation. Now with pre-syncope, nausea and vomiting. INR= 3.23 Takes Coumadin 6mg  on MWF and 4mg  all other days. Goal of Therapy:  INR 2-3 Monitor platelets by anticoagulation protocol: Yes   Plan:  With elevated INR, no coumadin today. INR daily in AM.   [name]  Oswald Hillock 05/03/2013,6:10 PM

## 2013-05-03 NOTE — ED Notes (Signed)
Patient transported to X-ray 

## 2013-05-03 NOTE — ED Notes (Signed)
Pt stated that she was going to church when she got there in the parking lot she broke out in a cold sweat and c/o n/v

## 2013-05-03 NOTE — ED Notes (Signed)
Bed:WA21<BR> Expected date:<BR> Expected time:<BR> Means of arrival:<BR> Comments:<BR> EMS

## 2013-05-04 DIAGNOSIS — R55 Syncope and collapse: Secondary | ICD-10-CM

## 2013-05-04 LAB — GLUCOSE, CAPILLARY
Glucose-Capillary: 260 mg/dL — ABNORMAL HIGH (ref 70–99)
Glucose-Capillary: 396 mg/dL — ABNORMAL HIGH (ref 70–99)
Glucose-Capillary: 346 mg/dL — ABNORMAL HIGH (ref 70–99)

## 2013-05-04 LAB — TROPONIN I: Troponin I: <0.3 ng/mL (ref <0.30)

## 2013-05-04 MED ORDER — METHYLPREDNISOLONE SODIUM SUCC 125 MG IJ SOLR
60.0000 mg | Freq: Two times a day (BID) | INTRAMUSCULAR | Status: DC
  Administered 2013-05-04 – 2013-05-05 (×2): 60 mg via INTRAVENOUS
  Filled 2013-05-04 (×4): qty 0.96

## 2013-05-04 MED ORDER — DEXTROSE 5 % IV SOLN
500.0000 mg | INTRAVENOUS | Status: DC
  Administered 2013-05-04 – 2013-05-05 (×2): 500 mg via INTRAVENOUS
  Filled 2013-05-04 (×4): qty 500

## 2013-05-04 MED ORDER — INSULIN GLARGINE 100 UNIT/ML ~~LOC~~ SOLN
40.0000 [IU] | Freq: Every day | SUBCUTANEOUS | Status: DC
  Administered 2013-05-04: 40 [IU] via SUBCUTANEOUS
  Filled 2013-05-04: qty 0.4

## 2013-05-04 NOTE — Progress Notes (Signed)
  Pharmacy Note (Brief) - Warfarin   72 yo F on chronic warfarin for hx of Afib. Patient presented to ER on 5/23 with acute onset of near syncope, and N/V. Home dose of warfarin reported as 6mg  on MWF and 4mg  on TuThSS - last taken 5/22 at 8pm.  Admit INR was supratherapetic (3.23) and remains supratherapeutic this morning (3.65). No bleeding reported in chart notes.  Plan: 1) Continue to hold warfarin 2) Daily INR 3) Plan to resume warfarin dosing once INR <3.  Darrol Angel, PharmD Pager: [phone] 05/04/2013 8:10 AM

## 2013-05-04 NOTE — Progress Notes (Signed)
[facility] PROGRESS NOTE  [name] ZOX:[id number] DOB: Jan 08, 1941 DOA: 05/03/2013 PCP: [name], MD  Assessment/Plan: Principal Problem:   Near syncope associated with nausea and vomiting Active Problems:   PULMONARY SARCOIDOSIS   Atrial fibrillation   Obstructive sleep apnea   Obesity hypoventilation syndrome   Gout   Diabetes mellitus   COPD exacerbation   Hyperlipidemia    Near syncope associated with nausea and vomiting / COPD exacerbation  -Suspect near syncope was vasovagal in nature from transient nausea/vomiting.  -Status post abdominal films and abdominal ultrasound which showed cholelithiasis without evidence of cholecystitis.  panniculitis  Diverticulosis without diverticulitis -Blood pressure slightly lower than usual baseline.  -Monitor on telemetry.  -On exam, patient with prominent wheezing and decreased air movement consistent with COPD exacerbation which may have contributed to her presenting symptoms.  -Continue Solu-Medrol 80 mg IV every 12 hours and taper as respiratory status improves.  -Continue nebulized bronchodilator therapy.  -Supplemental oxygen as needed.  Continue to cycle cardiac enzymes obtain a 2-D echo  Active Problems:  PULMONARY SARCOIDOSIS  -Non-steroid-dependent per patient.  Atrial fibrillation  -Rate controlled, on Coumadin, Cardizem, and beta blocker.  Obstructive sleep apnea / Obesity hypoventilation syndrome  -Continue CPAP each bedtime.  Gout  -Continue allopurinol and colchicine.  Diabetes mellitus  -Continue Lantus and start moderate sliding scale insulin.  -Watch glycemic control with steroid administration.  Hyperlipidemia  -Continue statin.      Code Status: full Family Communication: family updated about patient's clinical progress Disposition Plan: Possible DC one to 2 days   Brief narrative: 72 y.o. female with a PMH of atrial fibrillation on chronic coumadin, DM, sarcoid, and morbid  obesity who presented to the hospital of 05/03/13 with the acute onset of near syncope, nausea and vomiting while driving to church. No hematemesis, bile colored vomitus. Had a loose bowel movement last evening, but no frank diarrhea. Patient also endorses progressive dyspnea and a cough associated with postnasal drainage. She notes that she has been wheezing today. No recent illnesses or sick contacts. No recent antibiotic use. Feels better now after having received Zofran in the emergency department   Consultants:   none  Procedures:  None  Antibiotics:  None  HPI/Subjective: Denies any chest pain or shortness of breath this morning  Objective: Filed Vitals:   05/03/13 1749 05/03/13 2051 05/03/13 2129 05/04/13 0556  BP:   132/72 106/55  Pulse:   68 64  Temp:   97.5 F (36.4 C) 97.3 F (36.3 C)  TempSrc:   Oral Oral  Resp:   20 18  Height: 4\' 11"  (1.499 m)     Weight: 116.4 kg (256 lb 9.9 oz)     SpO2:  90% 91% 97%    Intake/Output Summary (Last 24 hours) at 05/04/13 8119 Last data filed at 05/04/13 0700  Gross per 24 hour  Intake 2911.25 ml  Output   1100 ml  Net 1811.25 ml    Exam:  Head: Normocephalic, atraumatic.  Eyes: PERRL, EOMI, sclerae nonicteric.  Mouth: Oropharynx with dry mucous membranes. Edentulous to the upper jaw.  Neck: Supple, no thyromegaly, no lymphadenopathy, no jugular venous distention.  Chest: Lungs with decreased air movement, expiratory wheezes throughout.  CV: Heart sounds are irregularly irregular. No murmurs, rubs, or gallops.  Abdomen: Soft, nontender, nondistended with normal active bowel sounds. Negative Murphy sign.  Extremities: Extremities with 1+ edema bilaterally. Left calf appears larger than right calf. No Homans sign.  Skin: Warm and dry.  Neuro: Alert and oriented times 3; cranial nerves II through XII grossly intact.  Psych: Mood and affect normal.    Data Reviewed: Basic Metabolic Panel:  Recent Labs Lab  05/03/13 1336  NA 139  K 3.6  CL 100  CO2 32  GLUCOSE 200*  BUN 18  CREATININE 0.77  CALCIUM 9.9    Liver Function Tests:  Recent Labs Lab 05/03/13 1336  AST 19  ALT 13  ALKPHOS 82  BILITOT 0.6  PROT 7.1  ALBUMIN 3.8    Recent Labs Lab 05/03/13 1336  LIPASE 27   No results found for this basename: AMMONIA,  in the last 168 hours  CBC:  Recent Labs Lab 05/03/13 1336  WBC 7.4  NEUTROABS 6.2  HGB 13.4  HCT 40.4  MCV 84.0  PLT 176    Cardiac Enzymes:  Recent Labs Lab 05/03/13 1336  TROPONINI <0.30   BNP (last 3 results) No results found for this basename: PROBNP,  in the last 8760 hours   CBG:  Recent Labs Lab 05/03/13 2126 05/03/13 2330  GLUCAP 187* 210*    No results found for this or any previous visit (from the past 240 hour(s)).   Studies: US Abdomen Complete  05/03/2013   *RADIOLOGY REPORT*  Clinical Data:  History of gallstones, vomiting  COMPLETE ABDOMINAL ULTRASOUND  Comparison:  None.  Findings:  Gallbladder:  Echogenic foci in the gallbladder neck consistent with a gallstone which measures 1.4 x 0.4 cm.  No significant gallbladder wall thickening.  No pericholecystic fluid.  The gallbladder is not distended.  Per the sonographer, the sonographic Murphy's sign was negative.  Common bile duct:  Within normal limits at 6 mm in caliber.  Liver:  No focal lesion identified.  Within normal limits in parenchymal echogenicity.  IVC:  Infrahepatic IVC is obscured by bowel gas.  The suprahepatic IVC is unremarkable.  Pancreas:  No focal abnormality seen.  Spleen:  The spleen is enlarged at 14.8 x 8 16.3 x 8.8 cm yielding a total splenic volume of 1106 ml.  No focal lesion identified.  Right Kidney:  Normal reniform contour without hydronephrosis or focal solid renal lesion.  The kidney measures 10.7 cm in length.  Left Kidney:  Normal reniform contour without hydronephrosis or focal solid renal lesion.  The kidney measures 11 cm in length.  Abdominal  aorta:  No aneurysm identified.  IMPRESSION:  1.  Cholelithiasis without sonographic features to suggest acute cholecystitis.  2.  Splenomegaly without focal splenic lesion.   Original Report Authenticated By: Malachy Moan, M.D.   Ct Abdomen Pelvis W Contrast  05/03/2013   *RADIOLOGY REPORT*  Clinical Data: Acute onset of near syncope, nausea, and vomiting while driving.  Surgical history includes hysterectomy and lumpectomy for breast cancer.  CT ABDOMEN AND PELVIS WITH CONTRAST  Technique:  Multidetector CT imaging of the abdomen and pelvis was performed following the standard protocol during bolus administration of intravenous contrast.  Contrast: 80mL OMNIPAQUE IOHEXOL 300 MG/ML IV. Oral contrast was also administered.  Comparison: None.  Findings: Marked edema/induration involving the subcutaneous fat of the abdominal wall overlying the low pelvis.  Midline surgical scar in the low pelvis from the prior hysterectomy.  No evidence of anterior abdominal wall hernia.  Diffuse hepatic steatosis without focal hepatic parenchymal abnormality.  Normal appearing spleen, pancreas, adrenal glands, and kidneys.  Calcified gallstone within the otherwise normal appearing gallbladder.  No biliary ductal dilation.  Mild aorto- iliofemoral atherosclerosis.  No significant lymphadenopathy.  Small hiatal hernia.  Stomach otherwise normal in appearance. Normal-appearing small bowel.  Extensive diffuse colonic diverticulosis without evidence of acute diverticulitis.  Normal- appearing decompressed appendix in the right upper pelvis field with oral contrast.  No ascites.  Uterus surgically absent.  No adnexal masses or free pelvic fluid. Urinary bladder unremarkable.  Bone window images demonstrate generalized osseous demineralization, degenerative changes involving the lower thoracic and lumbar spine, lumbar scoliosis convex left, and degenerative changes in the hip joints.  Visualized lung bases clear apart from scarring  in the right middle lobe.  Heart mildly enlarged.  IMPRESSION:  1.  Marked edema/induration consistent with panniculitis involving the subcutaneous fat of the anterior abdominal wall overlying the lower pelvis, below the midline surgical scar. 2.  No acute abnormalities otherwise involving the abdomen or pelvis. 3.  Extensive diffuse colonic diverticulosis without evidence of acute diverticulitis. 4.  Cholelithiasis without CT evidence of acute cholecystitis. 5.  Small hiatal hernia.   Original Report Authenticated By: Hulan Saas, M.D.   Dg Abd Acute W/chest  05/03/2013   *RADIOLOGY REPORT*  Clinical Data: Nausea and vomiting.  Current history of diabetes, asthma, breast cancer, and atrial fibrillation.  ACUTE ABDOMEN SERIES (ABDOMEN 2 VIEW & CHEST 1 VIEW)  Comparison: No prior abdominal imaging.  Two-view chest x-ray 11/19/2012, 12/15/2011.  Findings: Bowel gas pattern unremarkable without evidence of obstruction or significant ileus.  No evidence of free intraperitoneal air on the lateral decubitus image.  5 very small round calcifications clustered in the right upper quadrant.  No abnormal calcifications elsewhere.  Surgical clips in the pelvis. Degenerative changes involving the lumbar spine and generalized osseous demineralization.  Cardiac silhouette mildly enlarged but stable.  Prominent bronchovascular markings diffusely and moderate to marked central peribronchial thickening, similar to prior examinations.  No new pulmonary parenchymal abnormalities.  IMPRESSION:  1.  No acute abdominal abnormality. 2.  Probable small gallstones in the right upper quadrant of the abdomen. 3.  Stable mild cardiomegaly and moderate to severe changes of chronic bronchitis and/or asthma.  No acute cardiopulmonary disease.   Original Report Authenticated By: Hulan Saas, M.D.    Scheduled Meds: . allopurinol  200 mg Oral Daily  . calcium-vitamin D  1 tablet Oral BID  . cholecalciferol  1,000 Units Oral Daily   . colchicine  0.6 mg Oral BID  . diltiazem  360 mg Oral Daily  . exemestane  25 mg Oral QPC breakfast  . ferrous gluconate  324 mg Oral Q breakfast  . insulin aspart  0-15 Units Subcutaneous TID WC  . insulin aspart  0-5 Units Subcutaneous QHS  . insulin glargine  20 Units Subcutaneous QHS  . ipratropium  0.5 mg Nebulization Q6H  . levalbuterol  0.63 mg Nebulization Q6H  . metFORMIN  500 mg Oral BID WC  . methylPREDNISolone (SOLU-MEDROL) injection  80 mg Intravenous Q6H  . metoprolol tartrate  25 mg Oral BID  . multivitamin with minerals  1 tablet Oral Daily  . potassium chloride SA  20 mEq Oral BID  . simvastatin  40 mg Oral QHS  . sodium chloride  3 mL Intravenous Q12H  . sodium chloride  3 mL Intravenous Q12H  . theophylline  200 mg Oral BID  . torsemide  20 mg Oral BID   Continuous Infusions:   Principal Problem:   Near syncope associated with nausea and vomiting Active Problems:   PULMONARY SARCOIDOSIS   Atrial fibrillation   Obstructive sleep apnea   Obesity hypoventilation syndrome  Gout   Diabetes mellitus   COPD exacerbation   Hyperlipidemia    Time spent: 40 minutes   [facility]  [facility] Pager [phone]. If 8PM-8AM, please contact night-coverage at [email or web address], password [facility] 05/04/2013, 8:22 AM  LOS: 1 day

## 2013-05-04 NOTE — Progress Notes (Signed)
Placed pt on nasal cpap for rest. 9cm h2o per chart review with 2l o2 bleedin.  RN aware.  Pt is tolerating well at this time.  HR87, sats95% .  Sterile water added to fill line of humidity chamber.

## 2013-05-05 DIAGNOSIS — I379 Nonrheumatic pulmonary valve disorder, unspecified: Secondary | ICD-10-CM

## 2013-05-05 LAB — GLUCOSE, CAPILLARY
Glucose-Capillary: 373 mg/dL — ABNORMAL HIGH (ref 70–99)
Glucose-Capillary: 360 mg/dL — ABNORMAL HIGH (ref 70–99)

## 2013-05-05 LAB — CBC
HCT: 41.9 % (ref 36.0–46.0)
Hemoglobin: 13.2 g/dL (ref 12.0–15.0)
RDW: 16.5 % — ABNORMAL HIGH (ref 11.5–15.5)
WBC: 13.4 10*3/uL — ABNORMAL HIGH (ref 4.0–10.5)

## 2013-05-05 LAB — BASIC METABOLIC PANEL
CO2: 28 mEq/L (ref 19–32)
Calcium: 9.7 mg/dL (ref 8.4–10.5)
Potassium: 4.1 mEq/L (ref 3.5–5.1)
Sodium: 137 mEq/L (ref 135–145)

## 2013-05-05 LAB — PROTIME-INR: INR: 2.88 — ABNORMAL HIGH (ref 0.00–1.49)

## 2013-05-05 MED ORDER — WARFARIN - PHARMACIST DOSING INPATIENT: Freq: Every day | Status: DC

## 2013-05-05 MED ORDER — HYDROCOD POLST-CHLORPHEN POLST 10-8 MG/5ML PO LQCR: 10.0000 mL | Freq: Two times a day (BID) | ORAL | Status: DC

## 2013-05-05 MED ORDER — DILTIAZEM HCL ER BEADS 240 MG PO CP24
360.0000 mg | ORAL_CAPSULE | Freq: Every day | ORAL | Status: DC
  Administered 2013-05-06 – 2013-05-14 (×9): 360 mg via ORAL
  Filled 2013-05-05 (×9): qty 1

## 2013-05-05 MED ORDER — ENSURE COMPLETE PO LIQD
237.0000 mL | Freq: Every day | ORAL | Status: DC
  Administered 2013-05-06 – 2013-05-13 (×8): 237 mL via ORAL

## 2013-05-05 MED ORDER — METHYLPREDNISOLONE SODIUM SUCC 125 MG IJ SOLR
60.0000 mg | Freq: Two times a day (BID) | INTRAMUSCULAR | Status: DC
  Administered 2013-05-05 – 2013-05-06 (×2): 60 mg via INTRAVENOUS
  Filled 2013-05-05 (×4): qty 0.96

## 2013-05-05 MED ORDER — INSULIN GLARGINE 100 UNIT/ML ~~LOC~~ SOLN
50.0000 [IU] | Freq: Every day | SUBCUTANEOUS | Status: DC
  Administered 2013-05-05: 50 [IU] via SUBCUTANEOUS
  Filled 2013-05-05 (×2): qty 0.5

## 2013-05-05 MED ORDER — HYDROCOD POLST-CHLORPHEN POLST 10-8 MG/5ML PO LQCR
5.0000 mL | Freq: Two times a day (BID) | ORAL | Status: DC
  Administered 2013-05-05 – 2013-05-06 (×3): 5 mL via ORAL
  Filled 2013-05-05 (×3): qty 5

## 2013-05-05 MED ORDER — METHYLPREDNISOLONE SODIUM SUCC 40 MG IJ SOLR
40.0000 mg | Freq: Two times a day (BID) | INTRAMUSCULAR | Status: DC
  Filled 2013-05-05 (×2): qty 1

## 2013-05-05 MED ORDER — INSULIN GLARGINE 100 UNIT/ML ~~LOC~~ SOLN
10.0000 [IU] | Freq: Once | SUBCUTANEOUS | Status: AC
  Administered 2013-05-05: 10 [IU] via SUBCUTANEOUS
  Filled 2013-05-05: qty 0.1

## 2013-05-05 MED ORDER — WARFARIN SODIUM 3 MG PO TABS
3.0000 mg | ORAL_TABLET | Freq: Once | ORAL | Status: AC
  Administered 2013-05-05: 3 mg via ORAL
  Filled 2013-05-05: qty 1

## 2013-05-05 NOTE — Progress Notes (Signed)
Nutrition Brief Note  Patient identified on the Malnutrition Screening Tool (MST) Report  Body mass index is 51.8 kg/(m^2). Patient meets criteria for extreme obesity based on current BMI.   Wt Readings from Last 10 Encounters:  05/03/13 256 lb 9.9 oz (116.4 kg)  04/08/13 260 lb (117.935 kg)  03/08/13 259 lb 14.4 oz (117.89 kg)  01/29/13 274 lb (124.286 kg)  11/16/12 280 lb 3.3 oz (127.1 kg)  11/02/12 274 lb 3.2 oz (124.376 kg)  08/08/12 294 lb (133.358 kg)  05/04/12 293 lb (132.904 kg)  02/02/12 281 lb 6.4 oz (127.642 kg)  01/19/12 279 lb 3.2 oz (126.644 kg)   Patient reports a 20 lb weight loss since January with decreased appetite and intake.  Slow weight loss is appropriate.  Discussed with patient and this continues to be the goal.  Tries to follow CHO MOD diet, "mostly vegetables".  Poor appetite but forcing self to eat here.  Likes Ensure and would like to receive this.  Current diet order is CHO MOD, patient is consuming approximately 75-100% of meals at this time. Labs and medications reviewed. No recent HgbA1C.  Renal labs elevated.  Will order Ensure Complete once every evening per patient request.  Encouraged continued healthy meal choices with consistent CHO and slow weight loss.  Oran Rein, RD, LDN Clinical Inpatient Dietitian Pager:  [phone] Weekend and after hours pager:  [phone]

## 2013-05-05 NOTE — Progress Notes (Signed)
Dr. Susie Cassette aware on rounds pt's BP running low. Most recent BP 115/58 with HR 70. See new order received to hold Diltiazem this am and give Metoprolol. Dr. Susie Cassette also entered new order into EPIC.

## 2013-05-05 NOTE — Progress Notes (Signed)
  Echocardiogram 2D Echocardiogram has been performed.  [name] 05/05/2013, 9:51 AM

## 2013-05-05 NOTE — Progress Notes (Signed)
Dr. Susie Cassette aware via phone pt's most recent cbg 429. New order to recheck cbg in 1 hr.

## 2013-05-05 NOTE — Progress Notes (Signed)
ANTICOAGULATION CONSULT NOTE - Follow Up  Pharmacy Consult for Warfarin Indication: atrial fibrillation  Allergies  Allergen Reactions  . Aspirin     Avoids due to being on blood thinners  . Ibuprofen     Avoids due to being on blood thinners  . Pravachol Other (See Comments)    Muscle pain  . Pravastatin Sodium     Patient Measurements: Height: 4\' 11"  (149.9 cm) Weight: 256 lb 9.9 oz (116.4 kg) IBW/kg (Calculated) : 43.2  Labs:  Recent Labs  05/03/13 1336 05/04/13 0450 05/04/13 0912 05/04/13 1432 05/04/13 2009 05/05/13 0519  HGB 13.4  --   --   --   --  13.2  HCT 40.4  --   --   --   --  41.9  PLT 176  --   --   --   --  193  LABPROT 31.2* 34.2*  --   --   --  28.7*  INR 3.23* 3.65*  --   --   --  2.88*  CREATININE 0.77  --   --   --   --  PENDING  TROPONINI <0.30  --  <0.30 <0.30 <0.30  --    Anticoag or Potential Interacting Medications:  Allopurinol (increased risk of bleeding) Azithromycin (increased risk of bleeding)  Assessment: 72 yo F on chronic warfarin for hx of Afib. Patient presented to ER on 5/23 with acute onset of near syncope, and N/V. Home dose of warfarin reported as 6mg  on MWF and 4mg  on TuThSS - last taken 5/22 at 8pm. Admit INR was supratherapetic (3.23) - did not get warfarin on 5/23 or 5/24. INR today is therapeutic (2.88). Will resume warfarin dosing tonight using lower dose.  Goal of Therapy:  INR 2-3 Monitor platelets by anticoagulation protocol: Yes   Plan:  1) Warfarin 3mg  PO x1 at 18:00 2) Daily INR  Darrol Angel, PharmD Pager: [phone] 05/05/2013,8:25 AM

## 2013-05-05 NOTE — Progress Notes (Signed)
[facility] PROGRESS NOTE  [name] NWG:[id number] DOB: December 08, 1941 DOA: 05/03/2013 PCP: [name], MD  Assessment/Plan: Principal Problem:   Near syncope associated with nausea and vomiting Active Problems:   PULMONARY SARCOIDOSIS   Atrial fibrillation   Obstructive sleep apnea   Obesity hypoventilation syndrome   Gout   Diabetes mellitus   COPD exacerbation   Hyperlipidemia    Near syncope associated with nausea and vomiting / COPD exacerbation  -Suspect near syncope was vasovagal in nature from transient nausea/vomiting.  -Status post abdominal films and abdominal ultrasound which showed cholelithiasis without evidence of cholecystitis.  panniculitis  Diverticulosis without diverticulitis  -Blood pressure slightly lower than usual baseline.  -Monitor on telemetry.  -On exam, patient with prominent wheezing and decreased air movement consistent with COPD exacerbation which may have contributed to her presenting symptoms.  -Decrease Solu-Medrol to 40 mg IV every 12 hours and taper as respiratory status improves.  -Continue nebulized bronchodilator therapy.  -Supplemental oxygen as needed.  Continue to cycle cardiac enzymes obtain a 2-D echo      PULMONARY SARCOIDOSIS  -Non-steroid-dependent per patient.  Atrial fibrillation  -Rate controlled, on Coumadin, Cardizem, and beta blocker.  Obstructive sleep apnea / Obesity hypoventilation syndrome  -Continue CPAP each bedtime.  Gout  -Continue allopurinol and colchicine.  Diabetes mellitus/uncontrolled  -Continue Lantus and start moderate sliding scale insulin.  -Watch glycemic control with steroid administration.   Hyperlipidemia  -Continue statin.    Code Status: full  Family Communication: family updated about patient's clinical progress  Disposition Plan: Possible DC one to 2 days    Brief narrative:  72 y.o. female with a PMH of atrial fibrillation on chronic coumadin, DM, sarcoid, and morbid  obesity who presented to the hospital of 05/03/13 with the acute onset of near syncope, nausea and vomiting while driving to church. No hematemesis, bile colored vomitus. Had a loose bowel movement last evening, but no frank diarrhea. Patient also endorses progressive dyspnea and a cough associated with postnasal drainage. She notes that she has been wheezing today. No recent illnesses or sick contacts. No recent antibiotic use. Feels better now after having received Zofran in the emergency department  Consultants:  none Procedures:  None Antibiotics:  None HPI/Subjective:  Denies any chest pain or shortness of breath this morning  Objective: Filed Vitals:   05/04/13 1926 05/04/13 2034 05/05/13 0540 05/05/13 0832  BP:  97/80 114/58   Pulse:  67 71   Temp:  97.7 F (36.5 C) 97.6 F (36.4 C)   TempSrc:  Oral Oral   Resp:  18 20   Height:      Weight:      SpO2: 93% 96% 95% 93%    Intake/Output Summary (Last 24 hours) at 05/05/13 1039 Last data filed at 05/05/13 0900  Gross per 24 hour  Intake    960 ml  Output    350 ml  Net    610 ml    Exam:  HENT:  Head: Atraumatic.  Nose: Nose normal.  Mouth/Throat: Oropharynx is clear and moist.  Eyes: Conjunctivae are normal. Pupils are equal, round, and reactive to light. No scleral icterus.  Neck: Neck supple. No tracheal deviation present.  Cardiovascular: Normal rate, regular rhythm, normal heart sounds and intact distal pulses.  Pulmonary/Chest: Effort normal and breath sounds normal. No respiratory distress.  Abdominal: Soft. Normal appearance and bowel sounds are normal. She exhibits no distension. There is no tenderness.  Musculoskeletal: She exhibits no edema and  no tenderness.  Neurological: She is alert. No cranial nerve deficit.    Data Reviewed: Basic Metabolic Panel:  Recent Labs Lab 05/03/13 1336 05/05/13 0519  NA 139 137  K 3.6 4.1  CL 100 97  CO2 32 28  GLUCOSE 200* 298*  BUN 18 PENDING  CREATININE 0.77  PENDING  CALCIUM 9.9 9.7    Liver Function Tests:  Recent Labs Lab 05/03/13 1336  AST 19  ALT 13  ALKPHOS 82  BILITOT 0.6  PROT 7.1  ALBUMIN 3.8    Recent Labs Lab 05/03/13 1336  LIPASE 27   No results found for this basename: AMMONIA,  in the last 168 hours  CBC:  Recent Labs Lab 05/03/13 1336 05/05/13 0519  WBC 7.4 13.4*  NEUTROABS 6.2  --   HGB 13.4 13.2  HCT 40.4 41.9  MCV 84.0 84.1  PLT 176 193    Cardiac Enzymes:  Recent Labs Lab 05/03/13 1336 05/04/13 0912 05/04/13 1432 05/04/13 2009  TROPONINI <0.30 <0.30 <0.30 <0.30   BNP (last 3 results)  Recent Labs  05/05/13 0519  PROBNP 3589.0*     CBG:  Recent Labs Lab 05/04/13 0745 05/04/13 1226 05/04/13 1653 05/04/13 2126 05/05/13 0735  GLUCAP 260* 336* 396* 346* 354*    No results found for this or any previous visit (from the past 240 hour(s)).   Studies: US Abdomen Complete  05/03/2013   *RADIOLOGY REPORT*  Clinical Data:  History of gallstones, vomiting  COMPLETE ABDOMINAL ULTRASOUND  Comparison:  None.  Findings:  Gallbladder:  Echogenic foci in the gallbladder neck consistent with a gallstone which measures 1.4 x 0.4 cm.  No significant gallbladder wall thickening.  No pericholecystic fluid.  The gallbladder is not distended.  Per the sonographer, the sonographic Murphy's sign was negative.  Common bile duct:  Within normal limits at 6 mm in caliber.  Liver:  No focal lesion identified.  Within normal limits in parenchymal echogenicity.  IVC:  Infrahepatic IVC is obscured by bowel gas.  The suprahepatic IVC is unremarkable.  Pancreas:  No focal abnormality seen.  Spleen:  The spleen is enlarged at 14.8 x 8 16.3 x 8.8 cm yielding a total splenic volume of 1106 ml.  No focal lesion identified.  Right Kidney:  Normal reniform contour without hydronephrosis or focal solid renal lesion.  The kidney measures 10.7 cm in length.  Left Kidney:  Normal reniform contour without hydronephrosis or  focal solid renal lesion.  The kidney measures 11 cm in length.  Abdominal aorta:  No aneurysm identified.  IMPRESSION:  1.  Cholelithiasis without sonographic features to suggest acute cholecystitis.  2.  Splenomegaly without focal splenic lesion.   Original Report Authenticated By: Malachy Moan, M.D.   Ct Abdomen Pelvis W Contrast  05/03/2013   *RADIOLOGY REPORT*  Clinical Data: Acute onset of near syncope, nausea, and vomiting while driving.  Surgical history includes hysterectomy and lumpectomy for breast cancer.  CT ABDOMEN AND PELVIS WITH CONTRAST  Technique:  Multidetector CT imaging of the abdomen and pelvis was performed following the standard protocol during bolus administration of intravenous contrast.  Contrast: 80mL OMNIPAQUE IOHEXOL 300 MG/ML IV. Oral contrast was also administered.  Comparison: None.  Findings: Marked edema/induration involving the subcutaneous fat of the abdominal wall overlying the low pelvis.  Midline surgical scar in the low pelvis from the prior hysterectomy.  No evidence of anterior abdominal wall hernia.  Diffuse hepatic steatosis without focal hepatic parenchymal abnormality.  Normal appearing spleen, pancreas,  adrenal glands, and kidneys.  Calcified gallstone within the otherwise normal appearing gallbladder.  No biliary ductal dilation.  Mild aorto- iliofemoral atherosclerosis.  No significant lymphadenopathy.  Small hiatal hernia.  Stomach otherwise normal in appearance. Normal-appearing small bowel.  Extensive diffuse colonic diverticulosis without evidence of acute diverticulitis.  Normal- appearing decompressed appendix in the right upper pelvis field with oral contrast.  No ascites.  Uterus surgically absent.  No adnexal masses or free pelvic fluid. Urinary bladder unremarkable.  Bone window images demonstrate generalized osseous demineralization, degenerative changes involving the lower thoracic and lumbar spine, lumbar scoliosis convex left, and degenerative  changes in the hip joints.  Visualized lung bases clear apart from scarring in the right middle lobe.  Heart mildly enlarged.  IMPRESSION:  1.  Marked edema/induration consistent with panniculitis involving the subcutaneous fat of the anterior abdominal wall overlying the lower pelvis, below the midline surgical scar. 2.  No acute abnormalities otherwise involving the abdomen or pelvis. 3.  Extensive diffuse colonic diverticulosis without evidence of acute diverticulitis. 4.  Cholelithiasis without CT evidence of acute cholecystitis. 5.  Small hiatal hernia.   Original Report Authenticated By: Hulan Saas, M.D.   Dg Abd Acute W/chest  05/03/2013   *RADIOLOGY REPORT*  Clinical Data: Nausea and vomiting.  Current history of diabetes, asthma, breast cancer, and atrial fibrillation.  ACUTE ABDOMEN SERIES (ABDOMEN 2 VIEW & CHEST 1 VIEW)  Comparison: No prior abdominal imaging.  Two-view chest x-ray 11/19/2012, 12/15/2011.  Findings: Bowel gas pattern unremarkable without evidence of obstruction or significant ileus.  No evidence of free intraperitoneal air on the lateral decubitus image.  5 very small round calcifications clustered in the right upper quadrant.  No abnormal calcifications elsewhere.  Surgical clips in the pelvis. Degenerative changes involving the lumbar spine and generalized osseous demineralization.  Cardiac silhouette mildly enlarged but stable.  Prominent bronchovascular markings diffusely and moderate to marked central peribronchial thickening, similar to prior examinations.  No new pulmonary parenchymal abnormalities.  IMPRESSION:  1.  No acute abdominal abnormality. 2.  Probable small gallstones in the right upper quadrant of the abdomen. 3.  Stable mild cardiomegaly and moderate to severe changes of chronic bronchitis and/or asthma.  No acute cardiopulmonary disease.   Original Report Authenticated By: Hulan Saas, M.D.    Scheduled Meds: . allopurinol  200 mg Oral Daily  .  azithromycin  500 mg Intravenous Q24H  . calcium-vitamin D  1 tablet Oral BID  . cholecalciferol  1,000 Units Oral Daily  . colchicine  0.6 mg Oral BID  . diltiazem  360 mg Oral Daily  . exemestane  25 mg Oral QPC breakfast  . ferrous gluconate  324 mg Oral Q breakfast  . insulin aspart  0-15 Units Subcutaneous TID WC  . insulin aspart  0-5 Units Subcutaneous QHS  . insulin glargine  40 Units Subcutaneous QHS  . ipratropium  0.5 mg Nebulization Q6H  . levalbuterol  0.63 mg Nebulization Q6H  . metFORMIN  500 mg Oral BID WC  . methylPREDNISolone (SOLU-MEDROL) injection  60 mg Intravenous Q12H  . metoprolol tartrate  25 mg Oral BID  . multivitamin with minerals  1 tablet Oral Daily  . potassium chloride SA  20 mEq Oral BID  . simvastatin  40 mg Oral QHS  . sodium chloride  3 mL Intravenous Q12H  . sodium chloride  3 mL Intravenous Q12H  . theophylline  200 mg Oral BID  . torsemide  20 mg Oral BID  .  warfarin  3 mg Oral ONCE-1800  . Warfarin - Pharmacist Dosing Inpatient   Does not apply q1800   Continuous Infusions:   Principal Problem:   Near syncope associated with nausea and vomiting Active Problems:   PULMONARY SARCOIDOSIS   Atrial fibrillation   Obstructive sleep apnea   Obesity hypoventilation syndrome   Gout   Diabetes mellitus   COPD exacerbation   Hyperlipidemia    Time spent: 40 minutes   [facility]  [facility] Pager [phone]. If 8PM-8AM, please contact night-coverage at [email or web address], password [email or web address] 05/05/2013, 10:39 AM  LOS: 2 days

## 2013-05-05 NOTE — Progress Notes (Signed)
Dr. Susie Cassette aware via phone [phone]. See new order received in EPIC.

## 2013-05-06 ENCOUNTER — Inpatient Hospital Stay (HOSPITAL_COMMUNITY): Payer: Medicare Other

## 2013-05-06 DIAGNOSIS — N179 Acute kidney failure, unspecified: Secondary | ICD-10-CM

## 2013-05-06 DIAGNOSIS — D649 Anemia, unspecified: Secondary | ICD-10-CM

## 2013-05-06 LAB — BASIC METABOLIC PANEL
GFR calc Af Amer: 62 mL/min — ABNORMAL LOW (ref >90)
GFR calc non Af Amer: 53 mL/min — ABNORMAL LOW (ref >90)
Glucose, Bld: 295 mg/dL — ABNORMAL HIGH (ref 70–99)
Potassium: 3.6 mEq/L (ref 3.5–5.1)
Sodium: 139 mEq/L (ref 135–145)

## 2013-05-06 LAB — GLUCOSE, CAPILLARY: Glucose-Capillary: 296 mg/dL — ABNORMAL HIGH (ref 70–99)

## 2013-05-06 LAB — PRO B NATRIURETIC PEPTIDE: Pro B Natriuretic peptide (BNP): 3322 pg/mL — ABNORMAL HIGH (ref 0–125)

## 2013-05-06 MED ORDER — POLYETHYLENE GLYCOL 3350 17 G PO PACK: 17.0000 g | PACK | Freq: Every day | ORAL | Status: DC | PRN

## 2013-05-06 MED ORDER — GUAIFENESIN 100 MG/5ML PO SOLN
5.0000 mL | ORAL | Status: DC | PRN
  Administered 2013-05-09 – 2013-05-12 (×4): 100 mg via ORAL
  Filled 2013-05-06 (×5): qty 10

## 2013-05-06 MED ORDER — INSULIN GLARGINE 100 UNIT/ML ~~LOC~~ SOLN
60.0000 [IU] | Freq: Every day | SUBCUTANEOUS | Status: DC
  Administered 2013-05-06 – 2013-05-07 (×2): 60 [IU] via SUBCUTANEOUS
  Filled 2013-05-06 (×3): qty 0.6

## 2013-05-06 MED ORDER — LEVALBUTEROL HCL 1.25 MG/0.5ML IN NEBU
1.2500 mg | INHALATION_SOLUTION | RESPIRATORY_TRACT | Status: DC | PRN
  Administered 2013-05-06: 1.25 mg via RESPIRATORY_TRACT
  Filled 2013-05-06 (×3): qty 0.5

## 2013-05-06 MED ORDER — GUAIFENESIN ER 600 MG PO TB12
600.0000 mg | ORAL_TABLET | Freq: Two times a day (BID) | ORAL | Status: DC
  Administered 2013-05-06 – 2013-05-14 (×17): 600 mg via ORAL
  Filled 2013-05-06 (×18): qty 1

## 2013-05-06 MED ORDER — WARFARIN SODIUM 5 MG PO TABS
5.0000 mg | ORAL_TABLET | Freq: Once | ORAL | Status: AC
  Administered 2013-05-06: 5 mg via ORAL
  Filled 2013-05-06: qty 1

## 2013-05-06 MED ORDER — AZITHROMYCIN 500 MG PO TABS
500.0000 mg | ORAL_TABLET | Freq: Every day | ORAL | Status: DC
  Administered 2013-05-06 – 2013-05-11 (×6): 500 mg via ORAL
  Filled 2013-05-06 (×6): qty 1

## 2013-05-06 MED ORDER — LEVALBUTEROL HCL 0.63 MG/3ML IN NEBU
0.6300 mg | INHALATION_SOLUTION | RESPIRATORY_TRACT | Status: DC
  Administered 2013-05-06 – 2013-05-11 (×29): 0.63 mg via RESPIRATORY_TRACT
  Filled 2013-05-06 (×54): qty 3

## 2013-05-06 MED ORDER — METHYLPREDNISOLONE SODIUM SUCC 125 MG IJ SOLR
80.0000 mg | Freq: Four times a day (QID) | INTRAMUSCULAR | Status: DC
  Administered 2013-05-06 – 2013-05-07 (×4): 80 mg via INTRAVENOUS
  Filled 2013-05-06 (×8): qty 1.28

## 2013-05-06 MED ORDER — SENNOSIDES-DOCUSATE SODIUM 8.6-50 MG PO TABS
2.0000 | ORAL_TABLET | Freq: Every evening | ORAL | Status: DC | PRN
  Administered 2013-05-06: 2 via ORAL
  Filled 2013-05-06: qty 2

## 2013-05-06 MED ORDER — IPRATROPIUM BROMIDE 0.02 % IN SOLN
0.5000 mg | RESPIRATORY_TRACT | Status: DC
  Administered 2013-05-06 – 2013-05-11 (×29): 0.5 mg via RESPIRATORY_TRACT
  Filled 2013-05-06 (×29): qty 2.5

## 2013-05-06 MED ORDER — HYDROCOD POLST-CHLORPHEN POLST 10-8 MG/5ML PO LQCR
5.0000 mL | Freq: Two times a day (BID) | ORAL | Status: DC | PRN
  Administered 2013-05-09 – 2013-05-13 (×7): 5 mL via ORAL
  Filled 2013-05-06 (×8): qty 5

## 2013-05-06 NOTE — Progress Notes (Signed)
PHARMACIST - PHYSICIAN COMMUNICATION CONCERNING: Antibiotic IV to Oral Route Change Policy  RECOMMENDATION: This patient is receiving Azithromycin by the intravenous route.  Based on criteria approved by the Pharmacy and Therapeutics Committee, the antibiotic(s) is/are being converted to the equivalent oral dose form(s).   DESCRIPTION: These criteria include:  Patient being treated for a respiratory tract infection, urinary tract infection, or cellulitis  The patient is not neutropenic and does not exhibit a GI malabsorption state  The patient is eating (either orally or via tube) and/or has been taking other orally administered medications for a least 24 hours  The patient is improving clinically and has a Tmax < 100.5  If you have questions about this conversion, please contact the Pharmacy Department  []   [phone] )  [facility] []   [phone] )  [facility]  []   [phone] )  [facility] [x]   [phone] )  [facility]    Geoffry Paradise, PharmD, BCPS Pager: [phone] 8:07 AM Pharmacy #: [phone]

## 2013-05-06 NOTE — Progress Notes (Addendum)
ANTICOAGULATION CONSULT NOTE - Follow Up Consult  Pharmacy Consult for Coumadin Indication: atrial fibrillation  Allergies  Allergen Reactions  . Aspirin     Avoids due to being on blood thinners  . Ibuprofen     Avoids due to being on blood thinners  . Pravachol Other (See Comments)    Muscle pain  . Pravastatin Sodium     Labs:  Recent Labs  05/03/13 1336 05/04/13 0450 05/04/13 0912 05/04/13 1432 05/04/13 2009 05/05/13 0519 05/06/13 0510  HGB 13.4  --   --   --   --  13.2  --   HCT 40.4  --   --   --   --  41.9  --   PLT 176  --   --   --   --  193  --   LABPROT 31.2* 34.2*  --   --   --  28.7* 24.0*  INR 3.23* 3.65*  --   --   --  2.88* 2.26*  CREATININE 0.77  --   --   --   --  1.20* 1.02  TROPONINI <0.30  --  <0.30 <0.30 <0.30  --   --     Estimated Creatinine Clearance: 56.2 ml/min (by C-G formula based on Cr of 1.02).   Assessment: 72 yo F on chronic coumadin for atrial fibrillation. PTA dose = 6mg  on MWF and 4mg  all other days with last dose 5/22 @ 9PM.  Patient presented to the ED 5/23 with acute onset of near syncope, nausea and vomiting.  INR found elevated at 3.24.  Coumadin resumed 5/25 when INR therapeutic  INR is 2.26 today, CBC wnl, Scr improved after bump 5/25.    Drug-drug interaction with Azithromycin noted - pharmacy will f/u  Goal of Therapy:  INR 2-3 Monitor platelets by anticoagulation protocol: Yes   Plan:   Coumadin 5mg  po x 1 tonight  Daily PT/INR  Pharmacy will f/u  Geoffry Paradise, PharmD, BCPS Pager: [phone] 8:05 AM Pharmacy #: [phone]

## 2013-05-06 NOTE — Progress Notes (Signed)
Patient ID: [name], female   DOB: 1941/08/08, 72 y.o.   MRN: [id number]  [facility] PROGRESS NOTE  [name] WGN:[id number] DOB: July 31, 1941 DOA: 05/03/2013 PCP: [name], MD  Brief narrative: 72 y.o. female with a PMH of atrial fibrillation on chronic coumadin, DM, sarcoid, and morbid obesity who presented to the hospital of 05/03/13 with the acute onset of near syncope, nausea and vomiting while driving to church. No hematemesis, bile colored vomitus. Had a loose bowel movement an evening prior to admission, but no frank diarrhea. Patient also endorses progressive dyspnea and a cough associated with postnasal drainage, associated with wheezing.   Principal Problem:   Near syncope associated with nausea and vomiting - unclear etiology but now resolved - continue to advance diet as pt able to tolerate - no events on telemetry - PT evaluation  Active Problems:   PULMONARY SARCOIDOSIS - pt will need outpatient follow up appointment with pulmonologist  - will obtain CXR today and may need HRCT scan before discharge   Atrial fibrillation - rate controlled, continue Coumadin per pharmacy and Diltiazem and Metoprolol   CHF, likely diastolic and clinically evident - lower extremity edema, crackles on lung exam - 2 D ECHO 5/25 with normal systolic function but grade I diastolic dysfunction - continue Torsemide    Obstructive sleep apnea/OHS - CPAP at night time   Diabetes mellitus, uncontrolled with neuropathy - due worsening wheezing on exam I will increase steroid dose and will therefore also increase Lantus dose to 60 units - will ask diabetic educator for further assistance    COPD exacerbation - more wheezing on exam, increase the dose of solumedrol and add levalbuterol as needed in addition to the scheduled dose - continue Zithromax and follow up on CXR  Consultants:  None  Procedures/Studies: Abdominal XRAY - 05/03/13 1. No acute abdominal abnormality.   2. Probable small gallstones in the right upper quadrant of the abdomen.  3. Stable mild cardiomegaly and moderate to severe changes of chronic bronchitis and/or asthma. No acute cardiopulmonary disease.  CT abdomen and pelvis - 05/03/13 1. Marked edema/induration consistent with panniculitis involving the subcutaneous fat of the anterior abdominal wall overlying the lower pelvis, below the midline surgical scar.  2. No acute abnormalities otherwise involving the abdomen or pelvis.  3. Extensive diffuse colonic diverticulosis without evidence of acute diverticulitis.  4. Cholelithiasis without CT evidence of acute cholecystitis.  5. Small hiatal hernia.  Antibiotics:  Zithromax 5/26 -->  Code Status: Full Family Communication: Pt at bedside Disposition Plan: Home when medically stable  HPI/Subjective: No events overnight.   Objective: Filed Vitals:   05/05/13 2146 05/06/13 0651 05/06/13 0753 05/06/13 1049  BP: 114/75 120/63  136/71  Pulse: 81 58  61  Temp: 97.5 F (36.4 C) 97.2 F (36.2 C)    TempSrc: Oral Oral    Resp: 18 20    Height:      Weight:      SpO2: 95% 97% 95%     Intake/Output Summary (Last 24 hours) at 05/06/13 1222 Last data filed at 05/06/13 0900  Gross per 24 hour  Intake    720 ml  Output   2650 ml  Net  -1930 ml    Exam:   General:  Pt is alert, follows commands appropriately, not in acute distress  Cardiovascular: Irregular rate and rhythm, no murmurs, no rubs, no gallops  Respiratory: Bilateral expiratory wheezing, mild crackles at bases  Abdomen: Soft, non tender, non  distended, bowel sounds present, no guarding  Extremities: Trace bilateral pitting LE edema, pulses DP and PT palpable bilaterally  Neuro: Grossly nonfocal  Data Reviewed: Basic Metabolic Panel:  Recent Labs Lab 05/03/13 1336 05/05/13 0519 05/06/13 0510  NA 139 137 139  K 3.6 4.1 3.6  CL 100 97 96  CO2 32 28 31  GLUCOSE 200* 298* 295*  BUN 18 40* 42*   CREATININE 0.77 1.20* 1.02  CALCIUM 9.9 9.7 9.5   Liver Function Tests:  Recent Labs Lab 05/03/13 1336  AST 19  ALT 13  ALKPHOS 82  BILITOT 0.6  PROT 7.1  ALBUMIN 3.8    Recent Labs Lab 05/03/13 1336  LIPASE 27   CBC:  Recent Labs Lab 05/03/13 1336 05/05/13 0519  WBC 7.4 13.4*  NEUTROABS 6.2  --   HGB 13.4 13.2  HCT 40.4 41.9  MCV 84.0 84.1  PLT 176 193   Cardiac Enzymes:  Recent Labs Lab 05/03/13 1336 05/04/13 0912 05/04/13 1432 05/04/13 2009  TROPONINI <0.30 <0.30 <0.30 <0.30   CBG:  Recent Labs Lab 05/05/13 1528 05/05/13 1722 05/05/13 2137 05/06/13 0741 05/06/13 1142  GLUCAP 373* 360* 375* 325* 310*     Scheduled Meds: . allopurinol  200 mg Oral Daily  . azithromycin  500 mg Oral Daily  . calcium-vitamin D  1 tablet Oral BID  . chlorpheniramine-HYDROcodone  5 mL Oral Q12H  . cholecalciferol  1,000 Units Oral Daily  . colchicine  0.6 mg Oral BID  . diltiazem  360 mg Oral Daily  . exemestane  25 mg Oral QPC breakfast  . feeding supplement  237 mL Oral Q2000  . ferrous gluconate  324 mg Oral Q breakfast  . insulin aspart  0-15 Units Subcutaneous TID WC  . insulin aspart  0-5 Units Subcutaneous QHS  . insulin glargine  50 Units Subcutaneous QHS  . ipratropium  0.5 mg Nebulization Q4H  . levalbuterol  0.63 mg Nebulization Q4H  . methylPREDNISolone (SOLU-MEDROL) injection  80 mg Intravenous Q6H  . metoprolol tartrate  25 mg Oral BID  . multivitamin with minerals  1 tablet Oral Daily  . potassium chloride SA  20 mEq Oral BID  . simvastatin  40 mg Oral QHS  . sodium chloride  3 mL Intravenous Q12H  . sodium chloride  3 mL Intravenous Q12H  . theophylline  200 mg Oral BID  . torsemide  20 mg Oral BID  . warfarin  5 mg Oral ONCE-1800  . Warfarin - Pharmacist Dosing Inpatient   Does not apply q1800   Continuous Infusions:   Debbora Presto, MD  [facility] Pager [phone]  If 7PM-7AM, please contact  night-coverage [email or web address] Password [email or web address] 05/06/2013, 12:22 PM   LOS: 3 days

## 2013-05-06 NOTE — Progress Notes (Signed)
Report received from J.Debruler,RN. No change in assessment. Continue plan of care.[name]

## 2013-05-07 DIAGNOSIS — I4891 Unspecified atrial fibrillation: Secondary | ICD-10-CM

## 2013-05-07 LAB — HEMOGLOBIN A1C
Hgb A1c MFr Bld: 7.3 % — ABNORMAL HIGH (ref <5.7)
Mean Plasma Glucose: 163 mg/dL — ABNORMAL HIGH (ref <117)

## 2013-05-07 LAB — BASIC METABOLIC PANEL
BUN: 38 mg/dL — ABNORMAL HIGH (ref 6–23)
Chloride: 92 mEq/L — ABNORMAL LOW (ref 96–112)
GFR calc Af Amer: 77 mL/min — ABNORMAL LOW (ref >90)
GFR calc non Af Amer: 66 mL/min — ABNORMAL LOW (ref >90)
Potassium: 3.2 mEq/L — ABNORMAL LOW (ref 3.5–5.1)

## 2013-05-07 LAB — CBC
HCT: 40.3 % (ref 36.0–46.0)
MCHC: 31.8 g/dL (ref 30.0–36.0)
Platelets: 173 10*3/uL (ref 150–400)
RDW: 16.2 % — ABNORMAL HIGH (ref 11.5–15.5)
WBC: 10.5 10*3/uL (ref 4.0–10.5)

## 2013-05-07 LAB — PROTIME-INR
INR: 2.11 — ABNORMAL HIGH (ref 0.00–1.49)
Prothrombin Time: 22.8 seconds — ABNORMAL HIGH (ref 11.6–15.2)

## 2013-05-07 LAB — GLUCOSE, CAPILLARY
Glucose-Capillary: 263 mg/dL — ABNORMAL HIGH (ref 70–99)
Glucose-Capillary: 356 mg/dL — ABNORMAL HIGH (ref 70–99)

## 2013-05-07 MED ORDER — FUROSEMIDE 10 MG/ML IJ SOLN
40.0000 mg | INTRAMUSCULAR | Status: AC
  Administered 2013-05-07: 40 mg via INTRAVENOUS
  Filled 2013-05-07: qty 4

## 2013-05-07 MED ORDER — WARFARIN SODIUM 5 MG PO TABS
5.0000 mg | ORAL_TABLET | Freq: Once | ORAL | Status: AC
  Administered 2013-05-07: 5 mg via ORAL
  Filled 2013-05-07: qty 1

## 2013-05-07 MED ORDER — POTASSIUM CHLORIDE CRYS ER 20 MEQ PO TBCR
40.0000 meq | EXTENDED_RELEASE_TABLET | Freq: Once | ORAL | Status: AC
  Administered 2013-05-07: 40 meq via ORAL
  Filled 2013-05-07: qty 2

## 2013-05-07 MED ORDER — SENNOSIDES-DOCUSATE SODIUM 8.6-50 MG PO TABS
1.0000 | ORAL_TABLET | Freq: Two times a day (BID) | ORAL | Status: DC
  Administered 2013-05-07 – 2013-05-14 (×15): 1 via ORAL
  Filled 2013-05-07 (×16): qty 1

## 2013-05-07 MED ORDER — POLYETHYLENE GLYCOL 3350 17 G PO PACK
17.0000 g | PACK | Freq: Two times a day (BID) | ORAL | Status: DC
  Administered 2013-05-07 – 2013-05-14 (×14): 17 g via ORAL
  Filled 2013-05-07 (×15): qty 1

## 2013-05-07 MED ORDER — METHYLPREDNISOLONE SODIUM SUCC 125 MG IJ SOLR
60.0000 mg | Freq: Two times a day (BID) | INTRAMUSCULAR | Status: DC
  Administered 2013-05-07 – 2013-05-08 (×2): 60 mg via INTRAVENOUS
  Filled 2013-05-07 (×4): qty 0.96

## 2013-05-07 NOTE — Clinical Documentation Improvement (Signed)
CHF DOCUMENTATION CLARIFICATION QUERY  THIS DOCUMENT IS NOT A PERMANENT PART OF THE MEDICAL RECORD  TO RESPOND TO THE THIS QUERY, FOLLOW THE INSTRUCTIONS BELOW:  1. If needed, update documentation for the patient's encounter via the notes activity.  2. Access this query again and click edit on the In [employer].  3. After updating, or not, click F2 to complete all highlighted (required) fields concerning your review. Select "additional documentation in the medical record" OR "no additional documentation provided".  4. Click Sign note button.  5. The deficiency will fall out of your In Basket *Please let us know if you are not able to complete this workflow by phone or e-mail (listed below).  Please update your documentation within the medical record to reflect your response to this query.                                                                                    05/07/13  Dear Dr. Yanik/ Associates,  In a better effort to capture your patient's severity of illness, reflect appropriate length of stay and utilization of resources, a review of the patient medical record has revealed the following indicators the diagnosis of Heart Failure.    Based on your clinical judgment, please clarify and document in a progress note and/or discharge summary the clinical condition associated with the following supporting information:  In responding to this query please exercise your independent judgment.  The fact that a query is asked, does not imply that any particular answer is desired or expected.  Please consider a more specific  CHF diastolic   ACUITY.  Thank you. Possible Clinical Conditions?   Chronic Diastolic Congestive Heart Failure  Acute Diastolic Congestive Heart Failure   Acute on Chronic Diastolic Congestive Heart Failure   Other Condition________________________________________ Cannot Clinically Determine  Supporting Information:  Risk Factors: CHF, OHS, Pulmonary  Sarcoidosis, OSA, COPD Signs & Symptoms: 05/05/13 "Decrease Solu-Medrol to 40 mg IV every 12 hours and taper as respiratory status improves.  -Continue nebulized bronchodilator therapy.  -Supplemental oxygen as needed.  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  - lower extremity edema, crackles on lung exam  - 2 D ECHO 5/25 with normal systolic function but grade I diastolic dysfunction  - continue Torsemide "  Diagnostics:  05/05/13 BNP>3589.0  05/06/13 [id number]:  Chronic findings of hypoventilation and pulmonary venous congestion  without definite acute cardiopulmonary disease  Treatment: 05/07/13 Lasix IV 40 mg  Stat Demadex 20 mg po BID, Xopenex neb every 4 hr and   prn,  Atrovent  Neb every 4 hrs  Reviewed: please see progress note 5/27, thank you  Thank You,  Andy Gauss RN, BSN  Clinical Documentation Specialist:  Pager [phone] Office [phone] [facility] Health Information Management [place]

## 2013-05-07 NOTE — Progress Notes (Signed)
Patient ID: [name], female   DOB: [birth year], 72 y.o.   MRN: [id number]  [facility] PROGRESS NOTE  MARVIN MAENZA WUJ:[id number] DOB: 12-May-1941 DOA: 05/03/2013 PCP: [name], MD  Brief narrative:  72 y.o. female with a PMH of atrial fibrillation on chronic coumadin, DM, sarcoid, and morbid obesity who presented to the hospital of 05/03/13 with the acute onset of near syncope, nausea and vomiting while driving to church. No hematemesis, bile colored vomitus. Had a loose bowel movement an evening prior to admission, but no frank diarrhea. Patient also endorses progressive dyspnea and a cough associated with postnasal drainage, associated with wheezing.   Principal Problem:  Near syncope associated with nausea and vomiting  - unclear etiology but now resolved  - continue to advance diet as pt able to tolerate  - no events on telemetry, CE x 4 sets negative - PT evaluation done, recommendation is for HHPT Active Problems:  PULMONARY SARCOIDOSIS  - pt will need outpatient follow up appointment with pulmonologist  Atrial fibrillation  - rate controlled, continue Coumadin per pharmacy, continue Diltiazem and Metoprolol  CHF, acute diastolic dysfunction - lower extremity edema, crackles on lung exam, and vascular congestion on CXR noted, BNP > 3000 - 2 D ECHO 5/25 with normal systolic function but grade I diastolic dysfunction  - continue Torsemide but will try Lasix today, pt is unsure if she was ever tried on Lasix, but will attempt today to see if we can diurese her more - will have to monitor daily weights, I's and O's, daily BMP Obstructive sleep apnea/OHS  - CPAP at night time  Diabetes mellitus, uncontrolled with neuropathy  - will taper the dose of Solumedrol and will monitor sugar levels closely - continue current regimen for now and will readjust as indicated  COPD exacerbation  - appears to be clinically stable, no wheezing on exam this AM - continue  Zithromax - taper down dose of Solumedrol and plan on transitioning to PO prednisone in AM Hypokalemia - will supplement today - BMP in AM  Consultants:  None Procedures/Studies:  Dg Chest 2 View 05/06/2013    Chronic findings of hypoventilation and pulmonary venous congestion without definite acute cardiopulmonary disease.    Abdominal XRAY - 05/03/13  1. No acute abdominal abnormality.  2. Probable small gallstones in the right upper quadrant of the abdomen.  3. Stable mild cardiomegaly and moderate to severe changes of chronic bronchitis and/or asthma. No acute cardiopulmonary disease.  CT abdomen and pelvis - 05/03/13  1. Marked edema/induration consistent with panniculitis involving the subcutaneous fat of the anterior abdominal wall overlying the lower pelvis, below the midline surgical scar.  2. No acute abnormalities otherwise involving the abdomen or pelvis.  3. Extensive diffuse colonic diverticulosis without evidence of acute diverticulitis.  4. Cholelithiasis without CT evidence of acute cholecystitis.  5. Small hiatal hernia.  Antibiotics:  Zithromax 5/26 -->  Code Status: Full  Family Communication: Pt at bedside  Disposition Plan: Home when medically stable, HHPT   HPI/Subjective: No events overnight.   Objective: Filed Vitals:   05/07/13 0030 05/07/13 0335 05/07/13 0619 05/07/13 0818  BP:   127/75   Pulse: 72  70   Temp:   97.5 F (36.4 C)   TempSrc:   Oral   Resp:   20   Height:      Weight:      SpO2: 92% 91% 90% 91%    Intake/Output Summary (Last 24 hours) at 05/07/13 1048  Last data filed at 05/07/13 0700  Gross per 24 hour  Intake    720 ml  Output   3375 ml  Net  -2655 ml    Exam:   General:  Pt is alert, follows commands appropriately, not in acute distress  Cardiovascular: Regular rate and rhythm, S1/S2, no murmurs, no rubs, no gallops  Respiratory: Bilateral crackles, scattered rhonchi  Abdomen: Soft, non tender, non distended, bowel  sounds present, no guarding  Extremities: +1 bilateral pitting LE edema, pulses DP and PT palpable bilaterally  Neuro: Grossly nonfocal  Data Reviewed: Basic Metabolic Panel:  Recent Labs Lab 05/03/13 1336 05/05/13 0519 05/06/13 0510 05/07/13 0500  NA 139 137 139 138  K 3.6 4.1 3.6 3.2*  CL 100 97 96 92*  CO2 32 28 31 35*  GLUCOSE 200* 298* 295* 329*  BUN 18 40* 42* 38*  CREATININE 0.77 1.20* 1.02 0.85  CALCIUM 9.9 9.7 9.5 9.6   Liver Function Tests:  Recent Labs Lab 05/03/13 1336  AST 19  ALT 13  ALKPHOS 82  BILITOT 0.6  PROT 7.1  ALBUMIN 3.8    Recent Labs Lab 05/03/13 1336  LIPASE 27   CBC:  Recent Labs Lab 05/03/13 1336 05/05/13 0519 05/07/13 0500  WBC 7.4 13.4* 10.5  NEUTROABS 6.2  --   --   HGB 13.4 13.2 12.8  HCT 40.4 41.9 40.3  MCV 84.0 84.1 84.3  PLT 176 193 173   Cardiac Enzymes:  Recent Labs Lab 05/03/13 1336 05/04/13 0912 05/04/13 1432 05/04/13 2009  TROPONINI <0.30 <0.30 <0.30 <0.30   CBG:  Recent Labs Lab 05/06/13 0741 05/06/13 1142 05/06/13 1653 05/06/13 2135 05/07/13 0718  GLUCAP 325* 310* 296* 365* 263*     Scheduled Meds: . allopurinol  200 mg Oral Daily  . azithromycin  500 mg Oral Daily  . calcium-vitamin D  1 tablet Oral BID  . cholecalciferol  1,000 Units Oral Daily  . colchicine  0.6 mg Oral BID  . diltiazem  360 mg Oral Daily  . exemestane  25 mg Oral QPC breakfast  . feeding supplement  237 mL Oral Q2000  . ferrous gluconate  324 mg Oral Q breakfast  . furosemide  40 mg Intravenous STAT  . guaiFENesin  600 mg Oral BID  . insulin aspart  0-15 Units Subcutaneous TID WC  . insulin aspart  0-5 Units Subcutaneous QHS  . insulin glargine  60 Units Subcutaneous QHS  . ipratropium  0.5 mg Nebulization Q4H  . levalbuterol  0.63 mg Nebulization Q4H  . methylPREDNISolone (SOLU-MEDROL) injection  60 mg Intravenous Q12H  . metoprolol tartrate  25 mg Oral BID  . multivitamin with minerals  1 tablet Oral Daily   . polyethylene glycol  17 g Oral BID  . potassium chloride SA  20 mEq Oral BID  . potassium chloride  40 mEq Oral Once  . senna-docusate  1 tablet Oral BID  . simvastatin  40 mg Oral QHS  . sodium chloride  3 mL Intravenous Q12H  . sodium chloride  3 mL Intravenous Q12H  . theophylline  200 mg Oral BID  . torsemide  20 mg Oral BID  . warfarin  5 mg Oral ONCE-1800  . Warfarin - Pharmacist Dosing Inpatient   Does not apply q1800   Continuous Infusions:    Debbora Presto, MD  [facility] Pager [phone]  If 7PM-7AM, please contact night-coverage [email or web address] Password [facility] 05/07/2013, 10:48 AM   LOS: 4 days

## 2013-05-07 NOTE — Progress Notes (Signed)
Inpatient Diabetes Program Recommendations  AACE/ADA: New Consensus Statement on Inpatient Glycemic Control (2013)  Target Ranges:  Prepandial:   less than 140 mg/dL      Peak postprandial:   less than 180 mg/dL (1-2 hours)      Critically ill patients:  140 - 180 mg/dL   Reason for Visit: Results for [name], [name] (MRN [id number]) as of 05/07/2013 11:22  Ref. Range 05/06/2013 07:41 05/06/2013 11:42 05/06/2013 16:53 05/06/2013 21:35 05/07/2013 07:18  Glucose-Capillary Latest Range: 70-99 mg/dL 409 (H) 811 (H) 914 (H) 365 (H) 263 (H)   Note elevated CBG's with steroid therapy.  A1C indicates that CBG's were in fairly good control prior to admit with A1C =7.3%.  Note Lantus dose at home was 20 units if CBG's greater than 150 mg/dL.  While on steroids, may consider adding Novolog meal coverage 6 units tid with meals (hold if patient eats less than 50%).  Note that once steroids are tapered, insulin doses will also need to be decreased.  Will follow.

## 2013-05-07 NOTE — Progress Notes (Signed)
Placed pt on nasal cpap for rest, 9cm h2o per home settings with 3l o2 bleedin.  RN notified.  Pt is tolerating well at this time.  HR 72, sats 92%. Sterile water added to fill line of humidity chamber.

## 2013-05-07 NOTE — Evaluation (Signed)
Physical Therapy Evaluation Patient Details Name: [name] MRN: [id number] DOB: 1941/07/06 Today's Date: 05/07/2013 Time: 1050-1105 PT Time Calculation (min): 15 min  PT Assessment / Plan / Recommendation Clinical Impression  Pt is a 72 year old female admitted with near syncope, nausea/vomiting, and pulmonary sarcoidosis.  Pt only felt able to transfer to [facility] and then to recliner today due to "poor breathing" and dizziness with mobility.  Pt agreed to attempt ambulation next visit.  Pt would benefit from acute PT services in order to improve independence with transfers and ambulation to prepare for d/c home.    PT Assessment  Patient needs continued PT services    Follow Up Recommendations  Home health PT    Does the patient have the potential to tolerate intense rehabilitation      Barriers to Discharge        Equipment Recommendations  None recommended by PT    Recommendations for Other Services     Frequency Min 3X/week    Precautions / Restrictions Precautions Precaution Comments: chronic 2L   Pertinent Vitals/Pain Pt remained on 3L O2 [place].      Mobility  Bed Mobility Bed Mobility: Supine to Sit Supine to Sit: 5: Supervision;With rails;HOB elevated Transfers Transfers: Sit to Stand;Stand to Sit;Stand Pivot Transfers Sit to Stand: 4: Min guard;From elevated surface Stand to Sit: 4: Min guard;To elevated surface Stand Pivot Transfers: 4: Min guard;From elevated surface Details for Transfer Assistance: min/guard for safety, verbal cues for hand placement, pt transferred from bed to [facility] then BSC to recliner, pt states slight dizziness with mobility so did not wish to ambulate today. Ambulation/Gait Ambulation/Gait Assistance: Not tested (comment) (pt declined)    Exercises     PT Diagnosis: Difficulty walking  PT Problem List: Decreased activity tolerance;Decreased mobility;Cardiopulmonary status limiting activity;Obesity PT Treatment Interventions: DME  instruction;Gait training;Functional mobility training;Therapeutic activities;Therapeutic exercise;Patient/family education   PT Goals Acute Rehab PT Goals PT Goal Formulation: With patient Time For Goal Achievement: 05/14/13 Potential to Achieve Goals: Good Pt will go Sit to Stand: with modified independence PT Goal: Sit to Stand - Progress: Goal set today Pt will go Stand to Sit: with modified independence PT Goal: Stand to Sit - Progress: Goal set today Pt will Ambulate: 51 - 150 feet;with modified independence;with least restrictive assistive device PT Goal: Ambulate - Progress: Goal set today Pt will Perform Home Exercise Program: with supervision, verbal cues required/provided PT Goal: Perform Home Exercise Program - Progress: Goal set today  Visit Information  Last PT Received On: 05/07/13 Assistance Needed: +1    Subjective Data  Subjective: I'm not breathing well and I'm dizzy when I get up.  (did not want to ambulate today but agreeable to attempt next visit)   Prior Functioning  Home Living Lives With: Alone Type of Home: Apartment Home Layout: One level Bathroom Toilet: Handicapped height Home Adaptive Equipment: Walker - four wheeled;Grab bars in shower;Shower chair with back Prior Function Level of Independence: Independent with assistive device(s) Comments: chronic 2L Communication Communication: No difficulties    Cognition  Cognition Arousal/Alertness: Awake/alert Behavior During Therapy: WFL for tasks assessed/performed Overall Cognitive Status: Within Functional Limits for tasks assessed    Extremity/Trunk Assessment Right Upper Extremity Assessment RUE ROM/Strength/Tone: [facility] for tasks assessed Left Upper Extremity Assessment LUE ROM/Strength/Tone: WFL for tasks assessed Right Lower Extremity Assessment RLE ROM/Strength/Tone: [facility] for tasks assessed Left Lower Extremity Assessment LLE ROM/Strength/Tone: [facility] for tasks assessed   Balance    End of  Session PT - End of Session Equipment Utilized During Treatment: Oxygen Activity Tolerance: Other (comment) (poor breathing per pt and dizziness with mobility) Patient left: in chair;with call bell/phone within reach  GP     [facility] E 05/07/2013, 12:56 PM Zenovia Jarred, PT, DPT 05/07/2013 Pager: [phone]

## 2013-05-07 NOTE — Care Management Note (Addendum)
    Page 1 of 2   05/14/2013     12:39:07 PM   CARE MANAGEMENT NOTE 05/14/2013  Patient:  [name], [name]   Account Number:  [id number]  Date Initiated:  05/07/2013  Documentation initiated by:  Lanier Clam  Subjective/Objective Assessment:   ADMITTED W/SYNCOPE.CHF,COPD.     Action/Plan:   FROM HOME.HAS RW,3N1,HOME 02.   Anticipated DC Date:  05/14/2013   Anticipated DC Plan:  HOME W HOME HEALTH SERVICES      DC Planning Services  CM consult      Choice offered to / List presented to:  C-1 Patient        HH arranged  HH-1 RN  HH-2 PT      [facility] agency  [facility]   Status of service:  Completed, signed off Medicare Important Message given?   (If response is "NO", the following Medicare IM given date fields will be blank) Date Medicare IM given:   Date Additional Medicare IM given:    Discharge Disposition:  HOME W HOME HEALTH SERVICES  Per UR Regulation:  Reviewed for med. necessity/level of care/duration of stay  If discussed at Long Length of Stay Meetings, dates discussed:   05/09/2013  05/14/2013    Comments:  05/14/13 Leilanni Halvorson RN,BSN NCM [phone] [facility] DEBBIE REP AWARE HH ORDERS,& HHRN-DECREASE LANTUS,LABS,PT/INR-THURS.  05/13/13 Cleotha Tsang RN,BSN NCM [phone] [facility] FOLLOWING-DEBBIE REP.HHRN/PT ORDERED.MAY D/C IN AM.  05/10/13 Skylen Spiering RN,BSN NCM [phone] DIURESING.?D/C HOME IN AM IF MED STABLE.TC [facility] DEBBIE REP AWARE OF HHRN/PT ORDERS PLACED,& ?D/C IN AM.  05/07/13 Jibri Schriefer RN,BSN NCM [phone] PT-HH.[facility] CHOSEN FOR HH.TC DEBBIE [facility] REP,AWARE OF HH,& FOLLOWING,AWAITING FINAL HH ORDERS.

## 2013-05-07 NOTE — Progress Notes (Signed)
ANTICOAGULATION CONSULT NOTE - Follow Up Consult  Pharmacy Consult for Coumadin Indication: atrial fibrillation  Allergies  Allergen Reactions  . Aspirin     Avoids due to being on blood thinners  . Ibuprofen     Avoids due to being on blood thinners  . Pravachol Other (See Comments)    Muscle pain  . Pravastatin Sodium     Labs:  Recent Labs  05/04/13 1432 05/04/13 2009 05/05/13 0519 05/06/13 0510 05/07/13 0500  HGB  --   --  13.2  --  12.8  HCT  --   --  41.9  --  40.3  PLT  --   --  193  --  173  LABPROT  --   --  28.7* 24.0* 22.8*  INR  --   --  2.88* 2.26* 2.11*  CREATININE  --   --  1.20* 1.02 0.85  TROPONINI <0.30 <0.30  --   --   --     Estimated Creatinine Clearance: 67.5 ml/min (by C-G formula based on Cr of 0.85).   Assessment: 72 yo F on chronic coumadin for atrial fibrillation. Patient presented to the ED 5/23 with acute onset of near syncope, nausea and vomiting.  PTA dose = 6mg  on MWF and 4mg  all other days with last dose 5/22 @ 9PM.INR found elevated at 3.24.  Coumadin resumed 5/25 when INR therapeutic.  INR therapeutic 2.11, CBC wnl, no bleeding/complications reported.    Drug-drug interaction with Azithromycin noted - pharmacy will f/u  Goal of Therapy:  INR 2-3 Monitor platelets by anticoagulation protocol: Yes   Plan:   Coumadin 5mg  po x 1 tonight  Daily PT/INR  Loralee Pacas, PharmD, BCPS Pager: [phone] 05/07/2013 9:44 AM

## 2013-05-08 DIAGNOSIS — E119 Type 2 diabetes mellitus without complications: Secondary | ICD-10-CM

## 2013-05-08 DIAGNOSIS — J441 Chronic obstructive pulmonary disease with (acute) exacerbation: Principal | ICD-10-CM

## 2013-05-08 DIAGNOSIS — G4733 Obstructive sleep apnea (adult) (pediatric): Secondary | ICD-10-CM

## 2013-05-08 LAB — BASIC METABOLIC PANEL
CO2: 39 mEq/L — ABNORMAL HIGH (ref 19–32)
Chloride: 94 mEq/L — ABNORMAL LOW (ref 96–112)
Glucose, Bld: 251 mg/dL — ABNORMAL HIGH (ref 70–99)
Sodium: 141 mEq/L (ref 135–145)

## 2013-05-08 LAB — GLUCOSE, CAPILLARY
Glucose-Capillary: 210 mg/dL — ABNORMAL HIGH (ref 70–99)
Glucose-Capillary: 195 mg/dL — ABNORMAL HIGH (ref 70–99)
Glucose-Capillary: 304 mg/dL — ABNORMAL HIGH (ref 70–99)

## 2013-05-08 LAB — CBC
HCT: 41.3 % (ref 36.0–46.0)
Hemoglobin: 13.4 g/dL (ref 12.0–15.0)
MCH: 27.4 pg (ref 26.0–34.0)
MCHC: 32.4 g/dL (ref 30.0–36.0)
RBC: 4.89 MIL/uL (ref 3.87–5.11)

## 2013-05-08 LAB — MAGNESIUM: Magnesium: 1.7 mg/dL (ref 1.5–2.5)

## 2013-05-08 LAB — PROTIME-INR
INR: 3.07 — ABNORMAL HIGH (ref 0.00–1.49)
Prothrombin Time: 30.1 seconds — ABNORMAL HIGH (ref 11.6–15.2)

## 2013-05-08 MED ORDER — INSULIN GLARGINE 100 UNIT/ML ~~LOC~~ SOLN
30.0000 [IU] | Freq: Every day | SUBCUTANEOUS | Status: DC
  Administered 2013-05-08 – 2013-05-10 (×3): 30 [IU] via SUBCUTANEOUS
  Filled 2013-05-08 (×4): qty 0.3

## 2013-05-08 MED ORDER — WARFARIN 0.5 MG HALF TABLET
0.5000 mg | ORAL_TABLET | Freq: Once | ORAL | Status: AC
  Administered 2013-05-08: 0.5 mg via ORAL
  Filled 2013-05-08: qty 1

## 2013-05-08 MED ORDER — POTASSIUM CHLORIDE CRYS ER 20 MEQ PO TBCR
40.0000 meq | EXTENDED_RELEASE_TABLET | ORAL | Status: AC
  Administered 2013-05-08 (×3): 40 meq via ORAL
  Filled 2013-05-08 (×3): qty 2

## 2013-05-08 MED ORDER — INSULIN ASPART 100 UNIT/ML ~~LOC~~ SOLN
4.0000 [IU] | Freq: Three times a day (TID) | SUBCUTANEOUS | Status: DC
  Administered 2013-05-08 – 2013-05-10 (×5): 4 [IU] via SUBCUTANEOUS

## 2013-05-08 MED ORDER — PREDNISONE 20 MG PO TABS
40.0000 mg | ORAL_TABLET | Freq: Every day | ORAL | Status: DC
  Administered 2013-05-08 – 2013-05-10 (×3): 40 mg via ORAL
  Filled 2013-05-08 (×4): qty 2

## 2013-05-08 NOTE — Progress Notes (Signed)
Patient ID: [name], female   DOB: May 09, 1941, 72 y.o.   MRN: [id number]  [facility] PROGRESS NOTE  [name] HYQ:[id number] DOB: 04-Oct-1941 DOA: 05/03/2013 PCP: [name], MD  Brief narrative:  72 y.o. female with a PMH of atrial fibrillation on chronic coumadin, DM, sarcoid, and morbid obesity who presented to the hospital of 05/03/13 with the acute onset of near syncope, nausea and vomiting while driving to [place]. No hematemesis, bile colored vomitus. Had a loose bowel movement an evening prior to admission, but no frank diarrhea. Patient also endorses progressive dyspnea and a cough associated with postnasal drainage, associated with wheezing.   Principal Problem:  Near syncope associated with nausea and vomiting  - unclear etiology but now resolved  - continue to advance diet as pt able to tolerate  - no events on telemetry, CE x 4 sets negative - PT evaluation done, recommendation is for HHPT  PULMONARY SARCOIDOSIS  - pt will need outpatient follow up appointment with pulmonologist   Atrial fibrillation  - rate controlled, continue Coumadin per pharmacy, continue Diltiazem and Metoprolol   CHF, acute diastolic dysfunction - lower extremity edema, crackles on lung exam, and vascular congestion on CXR noted, BNP > 3000 - 2 D ECHO 5/25 with normal systolic function but grade I diastolic dysfunction  - continue Torsemide but will try Lasix today, pt is unsure if she was ever tried on Lasix, but will attempt today to see if we can diurese her more - will have to monitor daily weights, I's and O's, daily BMP  Obstructive sleep apnea/OHS  - CPAP at night time   Diabetes mellitus, uncontrolled with neuropathy  - will taper the dose of Solumedrol and will monitor sugar levels closely - continue current regimen for now and will readjust as indicated   COPD exacerbation  - appears to be clinically stable, +wheezing - continue Zithromax - taper down dose of  Solumedrol and transition to PO prednisone in AM  Hypokalemia - will supplement today -check Mg - BMP in AM  Consultants:  None  Antibiotics:  Zithromax 5/26 -->  Code Status: Full  Family Communication: Pt at bedside  Disposition Plan: Home when medically stable, HHPT   HPI/Subjective: Still having SOB   Objective: Filed Vitals:   05/08/13 0029 05/08/13 0408 05/08/13 0508 05/08/13 0732  BP:   118/66   Pulse:   70   Temp:   97.6 F (36.4 C)   TempSrc:   Axillary   Resp: 16  18   Height:      Weight:   119.6 kg (263 lb 10.7 oz)   SpO2: 92% 92% 93% 91%    Intake/Output Summary (Last 24 hours) at 05/08/13 1038 Last data filed at 05/08/13 0256  Gross per 24 hour  Intake    361 ml  Output   4201 ml  Net  -3840 ml    Exam:   General:  Pt is alert, follows commands appropriately, not in acute distress  Cardiovascular: Regular rate and rhythm, S1/S2, no murmurs, no rubs, no gallops  Respiratory:rhonci and b/l exp wheezing  Abdomen: Soft, non tender, non distended, bowel sounds present, no guarding  Extremities: +1 bilateral pitting LE edema, pulses DP and PT palpable bilaterally  Neuro: Grossly nonfocal  Data Reviewed: Basic Metabolic Panel:  Recent Labs Lab 05/03/13 1336 05/05/13 0519 05/06/13 0510 05/07/13 0500 05/08/13 0458  NA 139 137 139 138 141  K 3.6 4.1 3.6 3.2* 2.9*  CL 100  97 96 92* 94*  CO2 32 28 31 35* 39*  GLUCOSE 200* 298* 295* 329* 251*  BUN 18 40* 42* 38* 38*  CREATININE 0.77 1.20* 1.02 0.85 0.78  CALCIUM 9.9 9.7 9.5 9.6 9.3   Liver Function Tests:  Recent Labs Lab 05/03/13 1336  AST 19  ALT 13  ALKPHOS 82  BILITOT 0.6  PROT 7.1  ALBUMIN 3.8    Recent Labs Lab 05/03/13 1336  LIPASE 27   CBC:  Recent Labs Lab 05/03/13 1336 05/05/13 0519 05/07/13 0500 05/08/13 0458  WBC 7.4 13.4* 10.5 8.8  NEUTROABS 6.2  --   --   --   HGB 13.4 13.2 12.8 13.4  HCT 40.4 41.9 40.3 41.3  MCV 84.0 84.1 84.3 84.5  PLT 176 193  173 177   Cardiac Enzymes:  Recent Labs Lab 05/03/13 1336 05/04/13 0912 05/04/13 1432 05/04/13 2009  TROPONINI <0.30 <0.30 <0.30 <0.30   CBG:  Recent Labs Lab 05/07/13 0718 05/07/13 1156 05/07/13 1637 05/07/13 2130 05/08/13 0728  GLUCAP 263* 278* 271* 356* 210*     Scheduled Meds: . allopurinol  200 mg Oral Daily  . azithromycin  500 mg Oral Daily  . calcium-vitamin D  1 tablet Oral BID  . cholecalciferol  1,000 Units Oral Daily  . colchicine  0.6 mg Oral BID  . diltiazem  360 mg Oral Daily  . exemestane  25 mg Oral QPC breakfast  . feeding supplement  237 mL Oral Q2000  . ferrous gluconate  324 mg Oral Q breakfast  . guaiFENesin  600 mg Oral BID  . insulin aspart  0-15 Units Subcutaneous TID WC  . insulin aspart  0-5 Units Subcutaneous QHS  . insulin glargine  60 Units Subcutaneous QHS  . ipratropium  0.5 mg Nebulization Q4H  . levalbuterol  0.63 mg Nebulization Q4H  . metoprolol tartrate  25 mg Oral BID  . multivitamin with minerals  1 tablet Oral Daily  . polyethylene glycol  17 g Oral BID  . potassium chloride SA  20 mEq Oral BID  . potassium chloride  40 mEq Oral Q4H  . predniSONE  40 mg Oral Q breakfast  . senna-docusate  1 tablet Oral BID  . simvastatin  40 mg Oral QHS  . sodium chloride  3 mL Intravenous Q12H  . sodium chloride  3 mL Intravenous Q12H  . theophylline  200 mg Oral BID  . torsemide  20 mg Oral BID  . Warfarin - Pharmacist Dosing Inpatient   Does not apply q1800   Continuous Infusions:    Marlin Canary, MD  [facility] Pager [phone]  If 7PM-7AM, please contact night-coverage [email or web address] Password [facility] 05/08/2013, 10:38 AM   LOS: 5 days

## 2013-05-08 NOTE — Progress Notes (Signed)
Inpatient Diabetes Program Recommendations  AACE/ADA: New Consensus Statement on Inpatient Glycemic Control (2013)  Target Ranges:  Prepandial:   less than 140 mg/dL      Peak postprandial:   less than 180 mg/dL (1-2 hours)      Critically ill patients:  140 - 180 mg/dL   Reason for Visit: Results for JERALDEAN, WECHTER (MRN [id number]) as of 05/08/2013 13:18  Ref. Range 05/07/2013 16:37 05/07/2013 21:30 05/08/2013 04:58 05/08/2013 07:28 05/08/2013 11:56  Glucose-Capillary Latest Range: 70-99 mg/dL 409 (H) 811 (H)  914 (H) 195 (H)   Note steroids decreased today.  Patient's A1C is 7.3% indicating fairly good control prior to admit on Lantus 20 units daily.  Please decrease Lantus to 30 units daily and add Novolog meal coverage 4 units tid with meals (while on Prednisone).  Will follow.

## 2013-05-08 NOTE — Progress Notes (Signed)
ANTICOAGULATION CONSULT NOTE - Follow Up Consult  Pharmacy Consult for Coumadin Indication: atrial fibrillation  Allergies  Allergen Reactions  . Aspirin     Avoids due to being on blood thinners  . Ibuprofen     Avoids due to being on blood thinners  . Pravachol Other (See Comments)    Muscle pain  . Pravastatin Sodium     Labs:  Recent Labs  05/06/13 0510 05/07/13 0500 05/08/13 0458  HGB  --  12.8 13.4  HCT  --  40.3 41.3  PLT  --  173 177  LABPROT 24.0* 22.8* 30.1*  INR 2.26* 2.11* 3.07*  CREATININE 1.02 0.85 0.78    Estimated Creatinine Clearance: 73 ml/min (by C-G formula based on Cr of 0.78).   Assessment: 72 yo F on chronic coumadin for atrial fibrillation. Patient presented to the ED 5/23 with acute onset of near syncope, nausea and vomiting.  PTA dose = 6mg  on MWF and 4mg  all other days with last dose 5/22 @ 9PM.INR found elevated at 3.24.  Coumadin resumed 5/25 when INR therapeutic.  INR slightly supratherapeutic 3.07 today  CBC wnl, no bleeding/complications reported.    Drug-drug interaction with Azithromycin likely - monitoring  Goal of Therapy:  INR 2-3 Monitor platelets by anticoagulation protocol: Yes   Plan:   Decrease Coumadin 0.5mg  po x 1 tonight  Daily PT/INR  Loralee Pacas, PharmD, BCPS Pager: [phone] 05/08/2013 11:15 AM

## 2013-05-08 NOTE — Progress Notes (Signed)
Physical Therapy Treatment Patient Details Name: [name] MRN: [id number] DOB: [birth year] Today's Date: 05/08/2013 Time: 4098-1191 PT Time Calculation (min): 26 min  PT Assessment / Plan / Recommendation Comments on Treatment Session  Pt remembered me from prior hospital visit in December. Pt reports she feels "bad".  Assisted pt OOB to [facility] to void then amb short distance in the hallway.  Pt coughing and demon 2/4 DOE. RT in room to give pt a breathing Tx.    Follow Up Recommendations  Home health PT     Does the patient have the potential to tolerate intense rehabilitation     Barriers to Discharge        Equipment Recommendations  None recommended by PT    Recommendations for Other Services    Frequency Min 3X/week   Plan      Precautions / Restrictions Precautions Precaution Comments: chronic 2L   Pertinent Vitals/Pain SATURATION QUALIFICATIONS: (This note is used to comply with regulatory documentation for home oxygen)  Patient Saturations on Room Air at Rest = 88%  Patient Saturations on Room Air while Ambulating = 80%  Patient Saturations on 3 Liters of oxygen while Ambulating = 90%  Please briefly explain why patient needs home oxygen: Hx COPD    Mobility  Bed Mobility Bed Mobility: Supine to Sit Supine to Sit: 5: Supervision;With rails;HOB elevated Details for Bed Mobility Assistance: increased time Transfers Transfers: Sit to Stand;Stand to Sit;Stand Pivot Transfers Sit to Stand: 4: Min guard;From elevated surface;From bed Stand to Sit: 4: Min guard;To elevated surface;To toilet;To chair/3-in-1 Stand Pivot Transfers: 4: Min guard;From elevated surface Details for Transfer Assistance: increased time and good use of hands to staedy self.  Assisted from bed to [facility] to void then to amb.  Ambulation/Gait Ambulation/Gait Assistance: 4: Min guard Ambulation Distance (Feet): 48 Feet Assistive device: 4-wheeled walker Ambulation/Gait Assistance Details:  increased time and <25% VC's on safety with turns/backward gaiot completion prior to sit.  Pt coughing and demon 2/4 DOE. Gait Pattern: Step-to pattern;Step-through pattern;Trunk flexed Gait velocity: decreased     PT Goals                                                          progressing    Visit Information  Last PT Received On: 05/08/13 Assistance Needed: +1    Subjective Data  Subjective: I remember you from December Patient Stated Goal: home   Cognition       Balance     End of Session PT - End of Session Equipment Utilized During Treatment: Oxygen Activity Tolerance: Patient limited by fatigue Patient left: in chair;with call bell/phone within reach;Other (comment) (RT in room)   GP     Armando Reichert 05/08/2013, 4:07 PM

## 2013-05-08 NOTE — Progress Notes (Signed)
Placed pt on nasal cpap at home settings of 9cm h2o with 3l o2 bleedin.  Pt is tolerating well at this time.  Sterile water added to humidity chamber.  Pt advised that RT available all night should she need further assistance.

## 2013-05-09 LAB — BASIC METABOLIC PANEL
BUN: 36 mg/dL — ABNORMAL HIGH (ref 6–23)
CO2: 41 mEq/L (ref 19–32)
Chloride: 94 mEq/L — ABNORMAL LOW (ref 96–112)
Creatinine, Ser: 0.76 mg/dL (ref 0.50–1.10)
Glucose, Bld: 199 mg/dL — ABNORMAL HIGH (ref 70–99)

## 2013-05-09 LAB — GLUCOSE, CAPILLARY
Glucose-Capillary: 167 mg/dL — ABNORMAL HIGH (ref 70–99)
Glucose-Capillary: 193 mg/dL — ABNORMAL HIGH (ref 70–99)
Glucose-Capillary: 388 mg/dL — ABNORMAL HIGH (ref 70–99)

## 2013-05-09 MED ORDER — FUROSEMIDE 10 MG/ML IJ SOLN
40.0000 mg | Freq: Every day | INTRAMUSCULAR | Status: DC
  Administered 2013-05-09 – 2013-05-10 (×2): 40 mg via INTRAVENOUS
  Filled 2013-05-09 (×3): qty 4

## 2013-05-09 NOTE — Progress Notes (Signed)
Patient ID: [name], female   DOB: [birth year], 72 y.o.   MRN: [id number]  [facility] PROGRESS NOTE  CAITLIN AINLEY ZHY:[id number] DOB: Jul 05, 1941 DOA: 05/03/2013 PCP: [name], MD  Brief narrative:  72 y.o. female with a PMH of atrial fibrillation on chronic coumadin, DM, sarcoid, and morbid obesity who presented to the hospital of 05/03/13 with the acute onset of near syncope, nausea and vomiting while driving to [place]. No hematemesis, bile colored vomitus. Had a loose bowel movement an evening prior to admission, but no frank diarrhea. Patient also endorses progressive dyspnea and a cough associated with postnasal drainage, associated with wheezing.   Principal Problem:  Near syncope associated with nausea and vomiting  - unclear etiology but now resolved  - continue to advance diet as pt able to tolerate  - no events on telemetry, CE x 4 sets negative - PT evaluation done, recommendation is for HHPT  PULMONARY SARCOIDOSIS  - pt will need outpatient follow up appointment with pulmonologist   Atrial fibrillation  - rate controlled, continue Coumadin per pharmacy, continue Diltiazem and Metoprolol   CHF, acute diastolic dysfunction - lower extremity edema, crackles on lung exam, and vascular congestion on CXR noted, BNP > 3000 - 2 D ECHO 5/25 with normal systolic function but grade I diastolic dysfunction  - continue Torsemide but will try Lasix today, pt is unsure if she was ever tried on Lasix, but will attempt today to see if we can diurese her more - will have to monitor daily weights (weight is up 3 kg), I's and O's, daily BMP - added IV lasix  Obstructive sleep apnea/OHS  - CPAP at night time   Diabetes mellitus, uncontrolled with neuropathy  - will taper the dose of Solumedrol and will monitor sugar levels closely - continue current regimen for now and will readjust as indicated   COPD exacerbation  - appears to be clinically stable, +wheezing -  continue Zithromax - taper down dose of Solumedrol and transition to PO prednisone  Hypokalemia - will supplement today -check Mg - BMP in AM  Consultants:  None  Antibiotics:  Zithromax 5/26 -->  Code Status: Full  Family Communication: Pt at bedside  Disposition Plan: Home when medically stable, HHPT   HPI/Subjective: Still having SOB   Objective: Filed Vitals:   05/08/13 2356 05/09/13 0319 05/09/13 0449 05/09/13 0728  BP: 134/75  140/83   Pulse: 79  67   Temp: 98 F (36.7 C)  97.7 F (36.5 C)   TempSrc: Oral  Oral   Resp: 18  18   Height:      Weight:   119 kg (262 lb 5.6 oz)   SpO2: 97% 94% 94% 91%    Intake/Output Summary (Last 24 hours) at 05/09/13 0941 Last data filed at 05/09/13 0900  Gross per 24 hour  Intake   1093 ml  Output    900 ml  Net    193 ml    Exam:   General:  Pt is alert, follows commands appropriately, not in acute distress  Cardiovascular: Regular rate and rhythm, S1/S2, no murmurs, no rubs, no gallops  Respiratory:rhonci and crackles, no wheezing  Abdomen: Soft, non tender, non distended, bowel sounds present, no guarding  Extremities: +1 bilateral pitting LE edema, pulses DP and PT palpable bilaterally  Neuro: Grossly nonfocal  Data Reviewed: Basic Metabolic Panel:  Recent Labs Lab 05/05/13 0519 05/06/13 0510 05/07/13 0500 05/08/13 0458 05/09/13 0415  NA 137 139 138  141 140  K 4.1 3.6 3.2* 2.9* 4.1  CL 97 96 92* 94* 94*  CO2 28 31 35* 39* 41*  GLUCOSE 298* 295* 329* 251* 199*  BUN 40* 42* 38* 38* 36*  CREATININE 1.20* 1.02 0.85 0.78 0.76  CALCIUM 9.7 9.5 9.6 9.3 9.3  MG  --   --   --  1.7  --    Liver Function Tests:  Recent Labs Lab 05/03/13 1336  AST 19  ALT 13  ALKPHOS 82  BILITOT 0.6  PROT 7.1  ALBUMIN 3.8    Recent Labs Lab 05/03/13 1336  LIPASE 27   CBC:  Recent Labs Lab 05/03/13 1336 05/05/13 0519 05/07/13 0500 05/08/13 0458  WBC 7.4 13.4* 10.5 8.8  NEUTROABS 6.2  --   --   --    HGB 13.4 13.2 12.8 13.4  HCT 40.4 41.9 40.3 41.3  MCV 84.0 84.1 84.3 84.5  PLT 176 193 173 177   Cardiac Enzymes:  Recent Labs Lab 05/03/13 1336 05/04/13 0912 05/04/13 1432 05/04/13 2009  TROPONINI <0.30 <0.30 <0.30 <0.30   CBG:  Recent Labs Lab 05/08/13 0728 05/08/13 1156 05/08/13 1641 05/08/13 2046 05/09/13 0738  GLUCAP 210* 195* 289* 304* 167*     Scheduled Meds: . allopurinol  200 mg Oral Daily  . azithromycin  500 mg Oral Daily  . calcium-vitamin D  1 tablet Oral BID  . cholecalciferol  1,000 Units Oral Daily  . colchicine  0.6 mg Oral BID  . diltiazem  360 mg Oral Daily  . exemestane  25 mg Oral QPC breakfast  . feeding supplement  237 mL Oral Q2000  . ferrous gluconate  324 mg Oral Q breakfast  . furosemide  40 mg Intravenous Daily  . guaiFENesin  600 mg Oral BID  . insulin aspart  0-15 Units Subcutaneous TID WC  . insulin aspart  0-5 Units Subcutaneous QHS  . insulin aspart  4 Units Subcutaneous TID WC  . insulin glargine  30 Units Subcutaneous QHS  . ipratropium  0.5 mg Nebulization Q4H  . levalbuterol  0.63 mg Nebulization Q4H  . metoprolol tartrate  25 mg Oral BID  . multivitamin with minerals  1 tablet Oral Daily  . polyethylene glycol  17 g Oral BID  . potassium chloride SA  20 mEq Oral BID  . predniSONE  40 mg Oral Q breakfast  . senna-docusate  1 tablet Oral BID  . simvastatin  40 mg Oral QHS  . sodium chloride  3 mL Intravenous Q12H  . sodium chloride  3 mL Intravenous Q12H  . theophylline  200 mg Oral BID  . torsemide  20 mg Oral BID  . Warfarin - Pharmacist Dosing Inpatient   Does not apply q1800   Continuous Infusions:    Marlin Canary, DO  [facility] Pager [phone]  If 7PM-7AM, please contact night-coverage [email or web address] Password [facility] 05/09/2013, 9:41 AM   LOS: 6 days

## 2013-05-09 NOTE — Progress Notes (Signed)
RT placed patient on cpap pressure of 9cmH20, via nasal mask with 3lpm 02 bleed in. Patient is tolerating cpap well at this time.

## 2013-05-09 NOTE — Progress Notes (Signed)
Physical Therapy Treatment Patient Details Name: [name] MRN: [id number] DOB: 12-Jul-1941 Today's Date: 05/09/2013 Time: 7829-5621 PT Time Calculation (min): 13 min  PT Assessment / Plan / Recommendation Comments on Treatment Session  Pt stated she was not having a good day and feels more SOB.  RN aware.  Pt only tolerated getting OOB to [facility] then back to bed.  Pt also c/o MAX dizzyness with activity, so unable to attempt amb.     Follow Up Recommendations  Home health PT     Does the patient have the potential to tolerate intense rehabilitation     Barriers to Discharge        Equipment Recommendations   (pt has all equipment from prior.)    Recommendations for Other Services    Frequency Min 3X/week   Plan      Precautions / Restrictions Precautions Precaution Comments: chronic 2L   Pertinent Vitals/Pain No c/o pain Increased c/o dyspnea    Mobility  Bed Mobility Bed Mobility: Supine to Sit;Sit to Supine Supine to Sit: 5: Supervision Sit to Supine: 5: Supervision Details for Bed Mobility Assistance: increased time Transfers Transfers: Sit to Stand;Stand to Sit Sit to Stand: 5: Supervision;4: Min guard;From bed;From toilet Stand to Sit: 5: Supervision;4: Min guard;To toilet;To bed Details for Transfer Assistance: increased time and good use of hands to staeay self.  Assisted from bed to [facility] then back to bed. Ambulation/Gait Ambulation/Gait Assistance Details: Unable to attempt due to max c/o dyspnea at rest and MAX c/o dizzyness during tranfer.      PT Goals                                                         progressing    Visit Information  Last PT Received On: 05/09/13 Assistance Needed: +1    Subjective Data  Subjective: I feel so dizzy today, I can't walk.  I'm sorry. Patient Stated Goal: n/a   Cognition    good   Balance   fair  End of Session PT - End of Session Equipment Utilized During Treatment: Oxygen Activity Tolerance: Treatment  limited secondary to medical complications (Comment) Patient left: in bed;with call bell/phone within reach   Felecia Shelling  PTA [facility]  Acute  Rehab Pager      [phone]

## 2013-05-09 NOTE — Progress Notes (Signed)
ANTICOAGULATION CONSULT NOTE - Follow Up  Pharmacy Consult for Coumadin Indication: atrial fibrillation  Allergies  Allergen Reactions  . Aspirin     Avoids due to being on blood thinners  . Ibuprofen     Avoids due to being on blood thinners  . Pravachol Other (See Comments)    Muscle pain  . Pravastatin Sodium     Labs:  Recent Labs  05/07/13 0500 05/08/13 0458 05/09/13 0415  HGB 12.8 13.4  --   HCT 40.3 41.3  --   PLT 173 177  --   LABPROT 22.8* 30.1* 31.2*  INR 2.11* 3.07* 3.23*  CREATININE 0.85 0.78 0.76    Estimated Creatinine Clearance: 72.7 ml/min (by C-G formula based on Cr of 0.76).   Assessment: 72 yo F on chronic coumadin for atrial fibrillation. Patient presented to the ED 5/23 with acute onset of near syncope, nausea and vomiting.  PTA dose = 6mg  on MWF and 4mg  all other days with last dose 5/22 @ 9PM. INR found elevated at 3.23 on admission.  Coumadin resumed 5/25 when INR therapeutic.  INR continues to trend up, remains supratherapeutic  Drug-drug interaction with Azithromycin likely - monitoring  No bleeding complications noted in chart notes.  Goal of Therapy:  INR 2-3 Monitor platelets by anticoagulation protocol: Yes   Plan:   No warfarin tonight  Daily PT/INR  Plan to resume warfarin once INR <3  Darrol Angel, PharmD Pager: [phone] 05/09/2013 10:38 AM

## 2013-05-10 ENCOUNTER — Inpatient Hospital Stay (HOSPITAL_COMMUNITY): Payer: Medicare Other

## 2013-05-10 LAB — GLUCOSE, CAPILLARY
Glucose-Capillary: 243 mg/dL — ABNORMAL HIGH (ref 70–99)
Glucose-Capillary: 435 mg/dL — ABNORMAL HIGH (ref 70–99)

## 2013-05-10 MED ORDER — FUROSEMIDE 10 MG/ML IJ SOLN
40.0000 mg | Freq: Two times a day (BID) | INTRAMUSCULAR | Status: DC
  Administered 2013-05-10 – 2013-05-11 (×2): 40 mg via INTRAVENOUS
  Filled 2013-05-10 (×3): qty 4

## 2013-05-10 MED ORDER — WARFARIN SODIUM 1 MG PO TABS
1.0000 mg | ORAL_TABLET | Freq: Once | ORAL | Status: AC
  Administered 2013-05-10: 1 mg via ORAL
  Filled 2013-05-10: qty 1

## 2013-05-10 MED ORDER — PREDNISONE 50 MG PO TABS
60.0000 mg | ORAL_TABLET | Freq: Every day | ORAL | Status: DC
  Administered 2013-05-11 – 2013-05-13 (×3): 60 mg via ORAL
  Filled 2013-05-10 (×5): qty 1

## 2013-05-10 MED ORDER — INSULIN ASPART 100 UNIT/ML ~~LOC~~ SOLN
6.0000 [IU] | Freq: Three times a day (TID) | SUBCUTANEOUS | Status: DC
  Administered 2013-05-10 – 2013-05-14 (×13): 6 [IU] via SUBCUTANEOUS

## 2013-05-10 NOTE — Progress Notes (Addendum)
Patient ID: [name], female   DOB: [birth year], 72 y.o.   MRN: [id number]  [facility] PROGRESS NOTE  [name]:[id number] DOB: 1941-10-26 DOA: 05/03/2013 PCP: [name], MD  Brief narrative:  72 y.o. female with a PMH of atrial fibrillation on chronic coumadin, DM, sarcoid, and morbid obesity who presented to the hospital of 05/03/13 with the acute onset of near syncope, nausea and vomiting while driving to [place]. No hematemesis, bile colored vomitus. Had a loose bowel movement an evening prior to admission, but no frank diarrhea. Patient also endorses progressive dyspnea and a cough associated with postnasal drainage, associated with wheezing.   Principal Problem:  Near syncope associated with nausea and vomiting  - unclear etiology but now resolved  - continue to advance diet as pt able to tolerate  - no events on telemetry, CE x 4 sets negative - PT evaluation done, recommendation is for HHPT  PULMONARY SARCOIDOSIS  - pt will need outpatient follow up appointment with pulmonologist   Atrial fibrillation  - rate controlled, continue Coumadin per pharmacy, continue Diltiazem and Metoprolol   CHF, acute diastolic dysfunction - lower extremity edema, crackles on lung exam, and vascular congestion on CXR noted, BNP > 3000 - 2 D ECHO 5/25 with normal systolic function but grade I diastolic dysfunction  - continue Torsemide but will try Lasix today, pt is unsure if she was ever tried on Lasix, but will attempt today to see if we can diurese her more - will have to monitor daily weights (weight is up 3 kg), I's and O's, daily BMP - added IV lasix- increase to BID -2 view chest x ray ordered  Obstructive sleep apnea/OHS  - CPAP at night time   Diabetes mellitus, uncontrolled with neuropathy  - will taper the dose of Solumedrol and will monitor sugar levels closely - continue current regimen for now and will readjust as indicated   COPD exacerbation  -  appears to be clinically stable - continue Zithromax - taper down dose of Solumedrol and transition to PO prednisone  Hypokalemia - will supplement today -check Mg - BMP in AM  Consultants:  None  Antibiotics:  Zithromax 5/26 -->  Code Status: Full  Family Communication: Pt at bedside, LM for son Disposition Plan: Home when medically stable, HHPT   HPI/Subjective: Still having SOB - on 5L now on 2L at home  Objective: Filed Vitals:   05/10/13 0453 05/10/13 0500 05/10/13 0738 05/10/13 1159  BP: 123/57     Pulse: 77     Temp: 97.5 F (36.4 C)     TempSrc: Oral     Resp: 20     Height:      Weight:  119.5 kg (263 lb 7.2 oz)    SpO2: 90%  91% 93%    Intake/Output Summary (Last 24 hours) at 05/10/13 1321 Last data filed at 05/10/13 1100  Gross per 24 hour  Intake    960 ml  Output   4300 ml  Net  -3340 ml    Exam:   General:  Pt is alert, follows commands appropriately, not in acute distress  Cardiovascular: Regular rate and rhythm, S1/S2, no murmurs, no rubs, no gallops  Respiratory:rhonci and crackles, no wheezing  Abdomen: Soft, non tender, non distended, bowel sounds present, no guarding  Extremities: +1 bilateral pitting LE edema, pulses DP and PT palpable bilaterally  Neuro: Grossly nonfocal  Data Reviewed: Basic Metabolic Panel:  Recent Labs Lab 05/05/13 0519 05/06/13  0510 05/07/13 0500 05/08/13 0458 05/09/13 0415  NA 137 139 138 141 140  K 4.1 3.6 3.2* 2.9* 4.1  CL 97 96 92* 94* 94*  CO2 28 31 35* 39* 41*  GLUCOSE 298* 295* 329* 251* 199*  BUN 40* 42* 38* 38* 36*  CREATININE 1.20* 1.02 0.85 0.78 0.76  CALCIUM 9.7 9.5 9.6 9.3 9.3  MG  --   --   --  1.7  --    Liver Function Tests:  Recent Labs Lab 05/03/13 1336  AST 19  ALT 13  ALKPHOS 82  BILITOT 0.6  PROT 7.1  ALBUMIN 3.8    Recent Labs Lab 05/03/13 1336  LIPASE 27   CBC:  Recent Labs Lab 05/03/13 1336 05/05/13 0519 05/07/13 0500 05/08/13 0458  WBC 7.4 13.4*  10.5 8.8  NEUTROABS 6.2  --   --   --   HGB 13.4 13.2 12.8 13.4  HCT 40.4 41.9 40.3 41.3  MCV 84.0 84.1 84.3 84.5  PLT 176 193 173 177   Cardiac Enzymes:  Recent Labs Lab 05/03/13 1336 05/04/13 0912 05/04/13 1432 05/04/13 2009  TROPONINI <0.30 <0.30 <0.30 <0.30   CBG:  Recent Labs Lab 05/09/13 1203 05/09/13 1649 05/09/13 2100 05/10/13 0747 05/10/13 1308  GLUCAP 164* 193* 388* 113* 243*     Scheduled Meds: . allopurinol  200 mg Oral Daily  . azithromycin  500 mg Oral Daily  . calcium-vitamin D  1 tablet Oral BID  . cholecalciferol  1,000 Units Oral Daily  . colchicine  0.6 mg Oral BID  . diltiazem  360 mg Oral Daily  . exemestane  25 mg Oral QPC breakfast  . feeding supplement  237 mL Oral Q2000  . ferrous gluconate  324 mg Oral Q breakfast  . furosemide  40 mg Intravenous Q12H  . guaiFENesin  600 mg Oral BID  . insulin aspart  0-15 Units Subcutaneous TID WC  . insulin aspart  0-5 Units Subcutaneous QHS  . insulin aspart  6 Units Subcutaneous TID WC  . insulin glargine  30 Units Subcutaneous QHS  . ipratropium  0.5 mg Nebulization Q4H  . levalbuterol  0.63 mg Nebulization Q4H  . metoprolol tartrate  25 mg Oral BID  . multivitamin with minerals  1 tablet Oral Daily  . polyethylene glycol  17 g Oral BID  . potassium chloride SA  20 mEq Oral BID  . [START ON 05/11/2013] predniSONE  60 mg Oral Q breakfast  . senna-docusate  1 tablet Oral BID  . simvastatin  40 mg Oral QHS  . sodium chloride  3 mL Intravenous Q12H  . theophylline  200 mg Oral BID  . torsemide  20 mg Oral BID  . warfarin  1 mg Oral ONCE-1800  . Warfarin - Pharmacist Dosing Inpatient   Does not apply q1800   Continuous Infusions:    Marlin Canary, DO  [facility] Pager [phone]  If 7PM-7AM, please contact night-coverage [email or web address] Password [email or web address] 05/10/2013, 1:21 PM   LOS: 7 days

## 2013-05-10 NOTE — Progress Notes (Signed)
Inpatient Diabetes Program Recommendations  AACE/ADA: New Consensus Statement on Inpatient Glycemic Control (2013)  Target Ranges:  Prepandial:   less than 140 mg/dL      Peak postprandial:   less than 180 mg/dL (1-2 hours)      Critically ill patients:  140 - 180 mg/dL   Results for ONIA, SHIFLETT (MRN [id number]) as of 05/10/2013 11:43  Ref. Range 05/09/2013 07:38 05/09/2013 12:03 05/09/2013 16:49 05/09/2013 21:00 05/10/2013 07:47  Glucose-Capillary Latest Range: 70-99 mg/dL 409 (H) 811 (H) 914 (H) 388 (H) 113 (H)    Inpatient Diabetes Program Recommendations Insulin - Meal Coverage: Please consider increasing Novolog meal coverage to 6 units TID with meals.  Note: Postprandial blood glucose consistently elevated.  Bedtime glucose last night noted to be 388 mg/dl and fasting glucose this morning in target at 113 mg/dl.  Please consider increasing Novolog meal coverage to 6 units TID with meals.  Thanks, Orlando Penner, RN, MSN, CCRN Diabetes Coordinator Inpatient Diabetes Program [phone]

## 2013-05-10 NOTE — Progress Notes (Signed)
RT placed patient on cpap 9cmH20 via nasal mask with 5lpm 02 bleed in. Patient is tolerating cpap well at this time.

## 2013-05-10 NOTE — Progress Notes (Signed)
ANTICOAGULATION CONSULT NOTE - Follow Up  Pharmacy Consult for Coumadin Indication: atrial fibrillation  Allergies  Allergen Reactions  . Aspirin     Avoids due to being on blood thinners  . Ibuprofen     Avoids due to being on blood thinners  . Pravachol Other (See Comments)    Muscle pain  . Pravastatin Sodium     Labs:  Recent Labs  05/08/13 0458 05/09/13 0415 05/10/13 0449  HGB 13.4  --   --   HCT 41.3  --   --   PLT 177  --   --   LABPROT 30.1* 31.2* 27.1*  INR 3.07* 3.23* 2.67*  CREATININE 0.78 0.76  --     Estimated Creatinine Clearance: 72.9 ml/min (by C-G formula based on Cr of 0.76).   Assessment: 72 yo F on chronic coumadin for atrial fibrillation. Patient presented to the ED 5/23 with acute onset of near syncope, nausea and vomiting.  PTA dose = 6mg  on MWF and 4mg  all other days with last dose 5/22 @ 9PM. INR found elevated at 3.23 on admission.  Coumadin resumed 5/25 when INR therapeutic.  INR now therapeutic after being 3.23 yesterday AM - doses given inpatient are: 3mg , 5mg , 0.5mg , and 0mg   No bleeding complications noted in chart notes.  Goal of Therapy:  INR 2-3 Monitor platelets by anticoagulation protocol: Yes   Plan:  1) 1mg  warfarin tonight - small dose due to supratherapeutic INR yesterday 2) Daily INR   Hessie Knows, PharmD, BCPS Pager [phone] 05/10/2013 8:49 AM

## 2013-05-11 ENCOUNTER — Inpatient Hospital Stay (HOSPITAL_COMMUNITY): Payer: Medicare Other

## 2013-05-11 DIAGNOSIS — E662 Morbid (severe) obesity with alveolar hypoventilation: Secondary | ICD-10-CM

## 2013-05-11 DIAGNOSIS — J40 Bronchitis, not specified as acute or chronic: Secondary | ICD-10-CM

## 2013-05-11 DIAGNOSIS — J9600 Acute respiratory failure, unspecified whether with hypoxia or hypercapnia: Secondary | ICD-10-CM

## 2013-05-11 DIAGNOSIS — D869 Sarcoidosis, unspecified: Secondary | ICD-10-CM

## 2013-05-11 LAB — CBC
HCT: 43.6 % (ref 36.0–46.0)
MCH: 27.2 pg (ref 26.0–34.0)
MCV: 84.2 fL (ref 78.0–100.0)
RDW: 16.3 % — ABNORMAL HIGH (ref 11.5–15.5)
WBC: 12.9 10*3/uL — ABNORMAL HIGH (ref 4.0–10.5)

## 2013-05-11 LAB — GLUCOSE, CAPILLARY: Glucose-Capillary: 145 mg/dL — ABNORMAL HIGH (ref 70–99)

## 2013-05-11 LAB — ANGIOTENSIN CONVERTING ENZYME: Angiotensin-Converting Enzyme: 68 U/L — ABNORMAL HIGH (ref 8–52)

## 2013-05-11 LAB — BASIC METABOLIC PANEL
BUN: 32 mg/dL — ABNORMAL HIGH (ref 6–23)
Calcium: 9.8 mg/dL (ref 8.4–10.5)
Chloride: 89 mEq/L — ABNORMAL LOW (ref 96–112)
Creatinine, Ser: 0.76 mg/dL (ref 0.50–1.10)
GFR calc Af Amer: >90 mL/min (ref >90)

## 2013-05-11 MED ORDER — IOHEXOL 350 MG/ML SOLN
100.0000 mL | Freq: Once | INTRAVENOUS | Status: AC | PRN
  Administered 2013-05-11: 100 mL via INTRAVENOUS

## 2013-05-11 MED ORDER — FUROSEMIDE 40 MG PO TABS
40.0000 mg | ORAL_TABLET | Freq: Every day | ORAL | Status: DC
  Filled 2013-05-11: qty 1

## 2013-05-11 MED ORDER — LEVOFLOXACIN 750 MG PO TABS
750.0000 mg | ORAL_TABLET | Freq: Every day | ORAL | Status: DC
  Administered 2013-05-11 – 2013-05-14 (×4): 750 mg via ORAL
  Filled 2013-05-11 (×4): qty 1

## 2013-05-11 MED ORDER — WARFARIN SODIUM 4 MG PO TABS
4.0000 mg | ORAL_TABLET | Freq: Once | ORAL | Status: AC
  Administered 2013-05-11: 4 mg via ORAL
  Filled 2013-05-11: qty 1

## 2013-05-11 MED ORDER — FUROSEMIDE 40 MG PO TABS
40.0000 mg | ORAL_TABLET | Freq: Every day | ORAL | Status: DC
  Administered 2013-05-12 – 2013-05-14 (×3): 40 mg via ORAL
  Filled 2013-05-11 (×3): qty 1

## 2013-05-11 MED ORDER — LEVALBUTEROL HCL 0.63 MG/3ML IN NEBU
0.6300 mg | INHALATION_SOLUTION | Freq: Four times a day (QID) | RESPIRATORY_TRACT | Status: DC
  Administered 2013-05-11 – 2013-05-14 (×13): 0.63 mg via RESPIRATORY_TRACT
  Filled 2013-05-11 (×21): qty 3

## 2013-05-11 MED ORDER — INSULIN ASPART 100 UNIT/ML ~~LOC~~ SOLN
8.0000 [IU] | Freq: Once | SUBCUTANEOUS | Status: AC
  Administered 2013-05-11: 8 [IU] via SUBCUTANEOUS

## 2013-05-11 MED ORDER — INSULIN GLARGINE 100 UNIT/ML ~~LOC~~ SOLN
34.0000 [IU] | Freq: Every day | SUBCUTANEOUS | Status: DC
  Administered 2013-05-11 – 2013-05-13 (×3): 34 [IU] via SUBCUTANEOUS
  Filled 2013-05-11 (×4): qty 0.34

## 2013-05-11 MED ORDER — IPRATROPIUM BROMIDE 0.02 % IN SOLN
0.5000 mg | Freq: Four times a day (QID) | RESPIRATORY_TRACT | Status: DC
  Administered 2013-05-11 – 2013-05-14 (×13): 0.5 mg via RESPIRATORY_TRACT
  Filled 2013-05-11 (×13): qty 2.5

## 2013-05-11 NOTE — Progress Notes (Signed)
CRITICAL VALUE ALERT  Critical value received:  CO2 42  Date of notification:  05/11/13  Time of notification:  0615  Critical value read back:yes  Nurse who received alert:  Lorretta Harp, RN  MD notified (1st page):  Benedetto Coons, NP  Time of first page:    MD notified (2nd page):  Time of second page:  Responding MD:  Benedetto Coons, NP  Time MD responded:  [phone]

## 2013-05-11 NOTE — Progress Notes (Signed)
Patient's son Mariateresa Batra called last night very upset that the MD did not call him back yesterday.  States he is very concerned about his mother and feels like "specialist" need to be called in.  Ethelene Browns would like for the MD to contact him today on his cell phone at [phone].  Will continue to monitor patient.

## 2013-05-11 NOTE — Consult Note (Signed)
PULMONARY  / CRITICAL CARE MEDICINE  Name: [name] MRN: [id number] DOB: Jan 14, 1941    ADMISSION DATE:  05/03/2013 CONSULTATION DATE:  05/11/13  REFERRING MD :  [name] PRIMARY SERVICE:  [facility]  CHIEF COMPLAINT:  Dyspnea  BRIEF PATIENT DESCRIPTION: 72 y.o. female with a PMH of atrial fibrillation on chronic coumadin, DM, sarcoid, and morbid obesity who presented to the hospital of 05/03/13 with the acute onset of near syncope, nausea and vomiting while driving to church. No hematemesis, bile colored vomitus. Had a loose bowel movement an evening prior to admission, but no frank diarrhea. Patient also endorses progressive dyspnea and a cough associated with postnasal drainage, associated with wheezing. Pulmonary consulted for hypoxia- uses O2 2L @ home but needing 4-5L here.   SIGNIFICANT EVENTS / STUDIES:    LINES / TUBES: PIV  CULTURES:   ANTIBIOTICS: levaquin 5/31>>  HISTORY OF PRESENT ILLNESS:  77 yoF never smoker known to [name] from office for remote hx sarcoid, OHS and OSA on CPAP and home O2 used at least for sleep. HPI reviewed with her and substantially as charted on notes by [name], reviewed. She may have been more dyspneic in recent weeks PTA, but without event, obvious swelling, fever, or significant cough. She does wheeze some. While I was talking with her today she had a sustained paroxysmal tight cough with no sputum. She has been having this since arrival and agrees it may start with something coming into back of throat. Denies overt reflux/ heartburn. CXR shows edema vs infiltrates, clarity affected by habitus. WBC elevated on prednisone w empiric levaquin. ProBNP was 3000> 1374 today.  PAST MEDICAL HISTORY :  Past Medical History  Diagnosis Date  . Diabetes mellitus   . Asthma   . Bronchitis   . Hernia   . Anemia   . Uterine cancer     s/p hysterectomy  . Arthritis   . Hernia   . Morbid obesity   . Sarcoid   . OSA (obstructive sleep apnea)     Uses nasal CPAP Q  HS  . Atrial fibrillation   . Breast cancer, stage 1 10/17/2011    s/p lumpectomy and XRT  . Cough   . Wheezing   . Chills   . Constipation   . Bruises easily   . Gout attack     ankle, then wrist and hands  . Hyperlipidemia    Past Surgical History  Procedure Laterality Date  . Total knee arthroplasty  2001  . Abdominal hysterectomy  2006  . Resection of uterine cancer    . Left lower leg fx--casted    . Breast lumpectomy Right    Prior to Admission medications   Medication Sig Start Date End Date Taking? Authorizing Provider  acetaminophen (TYLENOL) 500 MG tablet Take 1,000 mg by mouth every 6 (six) hours as needed. pain   Yes Historical Provider, MD  alendronate (FOSAMAX) 70 MG tablet Take 70 mg by mouth every 7 (seven) days. Take with a full glass of water on an empty stomach. Do not sit for 30 minutes.  Takes on saturday 04/05/13  Yes Victorino December, MD  allopurinol (ZYLOPRIM) 100 MG tablet Take 200 mg by mouth daily.  11/04/12  Yes Historical Provider, MD  Calcium Carbonate-Vitamin D (CALCIUM 600 + D PO) Take 1 tablet by mouth 2 (two) times daily.    Yes Historical Provider, MD  cholecalciferol (VITAMIN D) 1000 UNITS tablet Take 1 tablet (1,000 Units total) by mouth daily.  08/08/12 08/08/13 Yes Victorino December, MD  colchicine 0.6 MG tablet Take 1 tablet (0.6 mg total) by mouth 2 (two) times daily. 11/22/12  Yes [name], MD  diltiazem (TIAZAC) 360 MG 24 hr capsule Take 1 tablet by mouth daily.  06/06/11  Yes Historical Provider, MD  exemestane (AROMASIN) 25 MG tablet Take 1 tablet (25 mg total) by mouth daily after breakfast. 03/08/13  Yes Victorino December, MD  HUMULIN N 100 UNIT/ML injection Inject 5 Units into the skin as needed (5 units if blood sugar is greater than 150).  02/05/13  Yes Historical Provider, MD  HYDROcodone-acetaminophen (NORCO/VICODIN) 5-325 MG per tablet Take 1 tablet by mouth every 6 (six) hours as needed. For pain. 11/22/12  Yes [name], MD   insulin glargine (LANTUS) 100 UNIT/ML injection Inject 20 Units into the skin at bedtime. Pt only uses if cbg is greater than 150   Yes Historical Provider, MD  ipratropium (ATROVENT) 0.02 % nebulizer solution Take 2.5 mLs (0.5 mg total) by nebulization every 6 (six) hours. 11/22/12  Yes [name], MD  levalbuterol (XOPENEX) 0.63 MG/3ML nebulizer solution Take 3 mLs (0.63 mg total) by nebulization every 6 (six) hours. 11/22/12  Yes [name], MD  LORazepam (ATIVAN) 0.5 MG tablet Take 0.5 mg by mouth at bedtime as needed. For sleep.   Yes Historical Provider, MD  meclizine (ANTIVERT) 25 MG tablet Take 6.25-12.5 mg by mouth 3 (three) times daily as needed. For dizziness.   Yes Historical Provider, MD  metFORMIN (GLUCOPHAGE) 500 MG tablet Take 500 mg by mouth 2 (two) times daily with a meal. If cbg is greater than 120 pt takes medication   Yes Historical Provider, MD  metoprolol tartrate (LOPRESSOR) 25 MG tablet Take 25 mg by mouth 2 (two) times daily.  05/13/11  Yes Historical Provider, MD  Multiple Vitamin (MULTIVITAMIN WITH MINERALS) TABS Take 1 tablet by mouth daily.   Yes Historical Provider, MD  polyethylene glycol (MIRALAX / GLYCOLAX) packet Take 17 g by mouth daily. 11/22/12  Yes [name], MD  potassium chloride SA (K-DUR,KLOR-CON) 20 MEQ tablet Take 20 mEq by mouth 2 (two) times daily.  02/11/13  Yes Historical Provider, MD  promethazine (PHENERGAN) 25 MG tablet Take 25 mg by mouth every 6 (six) hours as needed. For nausea.   Yes Historical Provider, MD  simvastatin (ZOCOR) 40 MG tablet Take 40 mg by mouth at bedtime.   Yes Historical Provider, MD  theophylline (THEODUR) 200 MG 12 hr tablet Take 200 mg by mouth 2 (two) times daily.   Yes Historical Provider, MD  torsemide (DEMADEX) 20 MG tablet Take 20 mg by mouth 2 (two) times daily.   Yes Historical Provider, MD  warfarin (COUMADIN) 4 MG tablet Take 4-6 mg by mouth daily. Take 1 tab daily except for 1.5 tab on MWF.   Yes  Historical Provider, MD  ferrous gluconate (FERGON) 246 (28 FE) MG tablet Take 1 tablet (246 mg total) by mouth daily with breakfast. 10/17/11 12/02/12  Victorino December, MD  predniSONE (DELTASONE) 10 MG tablet Take 3 tablets for 3 days then 2 tablet for 3 days then 1 tablet for one day then stop. 11/22/12   Alba Cory, MD   Allergies  Allergen Reactions  . Aspirin     Avoids due to being on blood thinners  . Ibuprofen     Avoids due to being on blood thinners  . Pravachol Other (See Comments)  Muscle pain  . Pravastatin Sodium     FAMILY HISTORY:  Family History  Problem Relation Age of Onset  . Diabetes Mother   . Cancer Father     colon  . Diabetes Brother   . Cancer Sister   . Diabetes Sister    SOCIAL HISTORY:  reports that she has never smoked. She has never used smokeless tobacco. She reports that she does not drink alcohol or use illicit drugs.  REVIEW OF SYSTEMS:   Constitutional: Negative for fever, chills, weight loss, malaise/fatigue and diaphoresis.  HENT: Negative for hearing loss, ear pain, nosebleeds, congestion, sore throat, neck pain, tinnitus and ear discharge.   Eyes: Negative for blurred vision, double vision, photophobia, pain, discharge and redness.  Respiratory: + cough, No-hemoptysis, sputum production, +shortness of breath, +wheezing and stridor.   Cardiovascular: Negative for chest pain, palpitations, orthopnea, claudication, leg swelling and PND.  Gastrointestinal: Negative for heartburn, +nausea, vomiting on admmission, No-abdominal pain, diarrhea, constipation, blood in stool and melena.  Genitourinary: Negative for dysuria, urgency, frequency, hematuria and flank pain.  Musculoskeletal: Negative for myalgias, back pain, joint pain and falls.  Skin: Negative for itching and rash.  Neurological: Negative for dizziness, tingling, tremors, sensory change, speech change, focal weakness, seizures, loss of consciousness, weakness and headaches.   Endo/Heme/Allergies: Negative for environmental allergies and polydipsia. Does not bruise/bleed easily.  SUBJECTIVE:  Throat tickle triggered  Cough. Denies pain or sputum. Feels wheeze. VITAL SIGNS: Temp:  [97.5 F (36.4 C)-98 F (36.7 C)] 98 F (36.7 C) (05/31 1355) Pulse Rate:  [65-81] 65 (05/31 1355) Resp:  [16-20] 20 (05/31 1355) BP: (122-131)/(63-76) 125/72 mmHg (05/31 1355) SpO2:  [91 %-97 %] 94 % (05/31 1355) Weight:  [116.5 kg (256 lb 13.4 oz)] 116.5 kg (256 lb 13.4 oz) (05/31 0601)  PHYSICAL EXAMINATION: General: Morbidly obese. Pleasant. No distress semi-recumbent, until coughing started. Neuro: Oriented and appropriate. Non-focal HEENT:  Dentures, speech clear Neck:  No stridor or JVD Cardiovascular:  IRR, rate controlled AFib by monitor, no M  Lungs:  Paroxysmal cough, no wheeze, poor airflow Abdomen:  Soft, non-tender, obese, BS present Musculoskeletal:  Moves all extrems, deconditioned Skin: no rash seen   Recent Labs Lab 05/08/13 0458 05/09/13 0415 05/10/13 1705 05/11/13 0450  NA 141 140  --  137  K 2.9* 4.1  --  3.4*  CL 94* 94*  --  89*  CO2 39* 41*  --  42*  BUN 38* 36*  --  32*  CREATININE 0.78 0.76  --  0.76  GLUCOSE 251* 199* 407* 190*    Recent Labs Lab 05/07/13 0500 05/08/13 0458 05/11/13 0450  HGB 12.8 13.4 14.1  HCT 40.3 41.3 43.6  WBC 10.5 8.8 12.9*  PLT 173 177 186   Dg Chest 2 View  05/10/2013   *RADIOLOGY REPORT*  Clinical Data: Cough.  Shortness of breath.  CHEST - 2 VIEW  Comparison: PA and lateral chest 05/06/2013.  Findings: Again seen is cardiomegaly.  No pulmonary edema.  There is airspace disease in the left lower lobe best seen on the lateral view.  Right lung appears clear.  No pneumothorax or pleural fluid.  IMPRESSION:  1.  Left lower lobe airspace disease worrisome for pneumonia. 2.  Cardiomegaly without edema.   Original Report Authenticated By: Holley Dexter, M.D.  Meds reviewed CXR reviewed- hazy due to habitus.  Can't exclude adenopathy, interstitial edema or thickening ASSESSMENT / PLAN:  1) Acute on chronic respiratory failure- worse than baseline hypoxemia.  Coughing paroxysms she says are new in hosp, but suggestive of reflux induced cough on observation. She may have aspirated initially. Levaquin ok if no sputum for culture. Obesity- hypoventilation and component of CHF/ pulmonary edema likely, associated with fluid overload and initial ProBNP 3000. Now being diuresed. Past hx sarcoid- significant pulmonary activity is would be unusual at this age, but can check ACE. Past hx Rx Uterine Ca- don't expect change in this status. Consider PE despite warfarin. Body habitus, sedentary lifestyle and previous pelvic cancer are risks.  Rec- 1) Theophylline level- ordered 2) ACE level (opn steroids)-ordered 3) Reflux precautions, with further w/u if appropriate 4) This would probably be good opportunity to get CT angio of chest to exclude PE and look at parenchyma. - ordered  CD Maple Hudson, MD Pulmonary and Critical Care Medicine [facility] M [phone], [phone]   Alt Pager: [phone]  05/11/2013, 2:58 PM

## 2013-05-11 NOTE — Progress Notes (Signed)
Tom Callahan,NP notified of pt's cbg 490 and orders received. Stat lab ordered per protocol. Will cont to monitor.

## 2013-05-11 NOTE — Progress Notes (Signed)
ANTICOAGULATION CONSULT NOTE - Follow Up  Pharmacy Consult for Coumadin Indication: atrial fibrillation  Allergies  Allergen Reactions  . Aspirin     Avoids due to being on blood thinners  . Ibuprofen     Avoids due to being on blood thinners  . Pravachol Other (See Comments)    Muscle pain  . Pravastatin Sodium     Labs:  Recent Labs  05/09/13 0415 05/10/13 0449 05/11/13 0450  HGB  --   --  14.1  HCT  --   --  43.6  PLT  --   --  186  LABPROT 31.2* 27.1* 21.7*  INR 3.23* 2.67* 1.98*  CREATININE 0.76  --  0.76    Estimated Creatinine Clearance: 71.7 ml/min (by C-G formula based on Cr of 0.76).   Assessment: 72 yo F on chronic coumadin for atrial fibrillation. Patient presented to the ED 5/23 with acute onset of near syncope, nausea and vomiting.  PTA dose = 6mg  on MWF and 4mg  all other days with last dose 5/22 @ 9PM. INR found elevated at 3.23 on admission.  Coumadin resumed 5/25 when INR therapeutic.  INR just below goal and dropping some still - Doses given inpatient are: 3mg , 5mg , 0.5mg , 0mg  and 1mg   No bleeding complications noted in chart notes.  Stable CBC  Goal of Therapy:  INR 2-3 Monitor platelets by anticoagulation protocol: Yes   Plan:  1) Increase to home dose of 4mg  today 2) Daily INR   Hessie Knows, PharmD, BCPS Pager [phone] 05/11/2013 9:55 AM

## 2013-05-11 NOTE — Progress Notes (Addendum)
Patient ID: [name], female   DOB: [birth year], 72 y.o.   MRN: [id number]  [facility] PROGRESS NOTE  GLYN ZENDEJAS WUJ:[id number] DOB: [birth year] DOA: 05/03/2013 PCP: [name], MD  Brief narrative:  72 y.o. female with a PMH of atrial fibrillation on chronic coumadin, DM, sarcoid, and morbid obesity who presented to the hospital of 05/03/13 with the acute onset of near syncope, nausea and vomiting while driving to church. No hematemesis, bile colored vomitus. Had a loose bowel movement an evening prior to admission, but no frank diarrhea. Patient also endorses progressive dyspnea and a cough associated with postnasal drainage, associated with wheezing.   Principal Problem:  Near syncope associated with nausea and vomiting  - unclear etiology but now resolved  - continue to advance diet as pt able to tolerate  - no events on telemetry, CE x 4 sets negative - PT evaluation done, recommendation is for HHPT  Acute on chronic resp failure -on 2L at home but have been unable to wean off O2 more then 4-5 L -have diuresed- BNP lower, chest x ray clear - on levaquin/steriods oral- weaned from IV - INR therapeutic so doubt PE  PULMONARY SARCOIDOSIS  - ask pulm to see -on steroids  Atrial fibrillation  - rate controlled, continue Coumadin per pharmacy, continue Diltiazem and Metoprolol   CHF, acute diastolic dysfunction - lower extremity edema, crackles on lung exam, and vascular congestion on CXR noted, BNP > 3000 - 2 D ECHO 5/25 with normal systolic function but grade I diastolic dysfunction  - will have to monitor daily weights (weight was up 3 kg), I's and O's, daily BMP - added IV lasix- increase to BID -2 view chest x ray ordered- fluid improved  Obstructive sleep apnea/OHS  - CPAP at night time   Diabetes mellitus, uncontrolled with neuropathy  - will taper the dose of Solumedrol and will monitor sugar levels closely - continue current regimen for now and will  readjust as indicated   COPD exacerbation  - appears to be clinically stable - taper down dose of Solumedrol and transition to PO prednisone - abx  Hypokalemia - replete   Consultants:  pulm  Antibiotics:  Zithromax 5/26 -->  Code Status: Full  Family Communication: Pt at bedside, son on phone Disposition Plan: Home when medically stable, HHPT   HPI/Subjective: Still having SOB - tells me she is feeling better, tells the son she is about the same Asking for pulm consult  Objective: Filed Vitals:   05/11/13 0407 05/11/13 0601 05/11/13 0745 05/11/13 0951  BP:  122/76    Pulse:  74    Temp:  97.6 F (36.4 C)    TempSrc:  Oral    Resp:  18    Height:      Weight:  116.5 kg (256 lb 13.4 oz)    SpO2: 96% 97% 93% 91%    Intake/Output Summary (Last 24 hours) at 05/11/13 1136 Last data filed at 05/11/13 1000  Gross per 24 hour  Intake   1760 ml  Output   3901 ml  Net  -2141 ml    Exam:   General:  Pt is alert, follows commands appropriately, chronically ill appearing  Cardiovascular: Regular rate and rhythm, S1/S2, no murmurs, no rubs, no gallops  Respiratory:rhonci and crackles, no wheezing  Abdomen: Soft, non tender, non distended, bowel sounds present, no guarding  Extremities: +decreased edema, pulses DP and PT palpable bilaterally  Neuro: Grossly nonfocal  Data Reviewed: Basic  Metabolic Panel:  Recent Labs Lab 05/06/13 0510 05/07/13 0500 05/08/13 0458 05/09/13 0415 05/10/13 1705 05/11/13 0450  NA 139 138 141 140  --  137  K 3.6 3.2* 2.9* 4.1  --  3.4*  CL 96 92* 94* 94*  --  89*  CO2 31 35* 39* 41*  --  42*  GLUCOSE 295* 329* 251* 199* 407* 190*  BUN 42* 38* 38* 36*  --  32*  CREATININE 1.02 0.85 0.78 0.76  --  0.76  CALCIUM 9.5 9.6 9.3 9.3  --  9.8  MG  --   --  1.7  --   --   --    Liver Function Tests: No results found for this basename: AST, ALT, ALKPHOS, BILITOT, PROT, ALBUMIN,  in the last 168 hours No results found for this  basename: LIPASE, AMYLASE,  in the last 168 hours CBC:  Recent Labs Lab 05/05/13 0519 05/07/13 0500 05/08/13 0458 05/11/13 0450  WBC 13.4* 10.5 8.8 12.9*  HGB 13.2 12.8 13.4 14.1  HCT 41.9 40.3 41.3 43.6  MCV 84.1 84.3 84.5 84.2  PLT 193 173 177 186   Cardiac Enzymes:  Recent Labs Lab 05/04/13 1432 05/04/13 2009  TROPONINI <0.30 <0.30   CBG:  Recent Labs Lab 05/09/13 2100 05/10/13 0747 05/10/13 1308 05/10/13 1639 05/10/13 2204  GLUCAP 388* 113* 243* 435* 310*     Scheduled Meds: . allopurinol  200 mg Oral Daily  . calcium-vitamin D  1 tablet Oral BID  . cholecalciferol  1,000 Units Oral Daily  . colchicine  0.6 mg Oral BID  . diltiazem  360 mg Oral Daily  . exemestane  25 mg Oral QPC breakfast  . feeding supplement  237 mL Oral Q2000  . ferrous gluconate  324 mg Oral Q breakfast  . furosemide  40 mg Intravenous Q12H  . guaiFENesin  600 mg Oral BID  . insulin aspart  0-15 Units Subcutaneous TID WC  . insulin aspart  0-5 Units Subcutaneous QHS  . insulin aspart  6 Units Subcutaneous TID WC  . insulin glargine  30 Units Subcutaneous QHS  . ipratropium  0.5 mg Nebulization Q4H  . levalbuterol  0.63 mg Nebulization Q4H  . levofloxacin  750 mg Oral Daily  . metoprolol tartrate  25 mg Oral BID  . multivitamin with minerals  1 tablet Oral Daily  . polyethylene glycol  17 g Oral BID  . potassium chloride SA  20 mEq Oral BID  . predniSONE  60 mg Oral Q breakfast  . senna-docusate  1 tablet Oral BID  . simvastatin  40 mg Oral QHS  . sodium chloride  3 mL Intravenous Q12H  . theophylline  200 mg Oral BID  . torsemide  20 mg Oral BID  . warfarin  4 mg Oral ONCE-1800  . Warfarin - Pharmacist Dosing Inpatient   Does not apply q1800   Continuous Infusions:    Marlin Canary, DO  [facility] Pager [phone]  If 7PM-7AM, please contact night-coverage [email or web address] Password [facility] 05/11/2013, 11:36 AM   LOS: 8 days

## 2013-05-12 DIAGNOSIS — J690 Pneumonitis due to inhalation of food and vomit: Secondary | ICD-10-CM

## 2013-05-12 LAB — PROTIME-INR
INR: 1.71 — ABNORMAL HIGH (ref 0.00–1.49)
Prothrombin Time: 19.5 seconds — ABNORMAL HIGH (ref 11.6–15.2)

## 2013-05-12 LAB — CBC
MCH: 26.3 pg (ref 26.0–34.0)
Platelets: 228 10*3/uL (ref 150–400)
RBC: 5.13 MIL/uL — ABNORMAL HIGH (ref 3.87–5.11)
WBC: 14 10*3/uL — ABNORMAL HIGH (ref 4.0–10.5)

## 2013-05-12 LAB — BASIC METABOLIC PANEL
CO2: 38 mEq/L — ABNORMAL HIGH (ref 19–32)
Calcium: 9.6 mg/dL (ref 8.4–10.5)
Chloride: 90 mEq/L — ABNORMAL LOW (ref 96–112)
Potassium: 4.1 mEq/L (ref 3.5–5.1)
Sodium: 136 mEq/L (ref 135–145)

## 2013-05-12 LAB — GLUCOSE, CAPILLARY
Glucose-Capillary: 217 mg/dL — ABNORMAL HIGH (ref 70–99)
Glucose-Capillary: 134 mg/dL — ABNORMAL HIGH (ref 70–99)

## 2013-05-12 MED ORDER — WARFARIN SODIUM 4 MG PO TABS
4.0000 mg | ORAL_TABLET | Freq: Once | ORAL | Status: AC
  Administered 2013-05-12: 4 mg via ORAL
  Filled 2013-05-12: qty 1

## 2013-05-12 NOTE — Progress Notes (Signed)
Patient ID: [name], female   DOB: 11/30/1941, 72 y.o.   MRN: [id number]  [facility] PROGRESS NOTE  LESHAWN HOUSEWORTH ION:[id number] DOB: [birth year] DOA: 05/03/2013 PCP: [name], MD  Brief narrative:  72 y.o. female with a PMH of atrial fibrillation on chronic coumadin, DM, sarcoid, and morbid obesity who presented to the hospital of 05/03/13 with the acute onset of near syncope, nausea and vomiting while driving to church. No hematemesis, bile colored vomitus. Had a loose bowel movement an evening prior to admission, but no frank diarrhea. Patient also endorses progressive dyspnea and a cough associated with postnasal drainage, associated with wheezing.   Principal Problem:  Near syncope associated with nausea and vomiting  - unclear etiology but now resolved  - continue to advance diet as pt able to tolerate  - no events on telemetry, CE x 4 sets negative - PT evaluation done, recommendation is for HHPT  Acute on chronic resp failure -on 2L at home but have been unable to wean off O2 more then 4-5 L -have diuresed- BNP lower, chest x ray clear of fluid - on levaquin/steriods oral- weaned from IV --appreciate pulm's guidance- CTA neg for PE  PULMONARY SARCOIDOSIS  - ask pulm to see -on steroids -ACE level ordered  Atrial fibrillation  - rate controlled, continue Coumadin per pharmacy, continue Diltiazem and Metoprolol   CHF, acute diastolic dysfunction - lower extremity edema, crackles on lung exam, and vascular congestion on CXR noted, BNP > 3000 - 2 D ECHO 5/25 with normal systolic function but grade I diastolic dysfunction  - will have to monitor daily weights (weight was up 3 kg), I's and O's, daily BMP - change to PO now that I think she is at baseline wieght   Obstructive sleep apnea/OHS  - CPAP at night time   Diabetes mellitus, uncontrolled with neuropathy  - lantus +SSI  COPD exacerbation  - appears to be clinically stable - taper down dose of  Solumedrol and transition to PO prednisone - abx -weaned off O2 back to 2L  Hypokalemia - replete   Consultants:  pulm  Antibiotics:  Zithromax 5/26 -->  Code Status: Full  Family Communication: Pt at bedside, son on phone Disposition Plan: Home when medically stable, HHPT vs SNF  HPI/Subjective: Breathing better today No CP No abd pain  Objective: Filed Vitals:   05/11/13 2204 05/12/13 0119 05/12/13 0138 05/12/13 0655  BP: 137/83   135/75  Pulse: 85   79  Temp: 97.5 F (36.4 C)   97.6 F (36.4 C)  TempSrc: Oral   Oral  Resp: 20  18 20   Height:      Weight:    115.803 kg (255 lb 4.8 oz)  SpO2: 94% 94%  95%    Intake/Output Summary (Last 24 hours) at 05/12/13 0836 Last data filed at 05/12/13 0753  Gross per 24 hour  Intake   1880 ml  Output   3451 ml  Net  -1571 ml    Exam:   General:  Pt is alert, follows commands appropriately, chronically ill appearing  Cardiovascular: Regular rate and rhythm, S1/S2, no murmurs, no rubs, no gallops  Respiratory:rhonci and crackles, no wheezing  Abdomen: Soft, non tender, non distended, bowel sounds present, no guarding  Extremities: +decreased edema, pulses DP and PT palpable bilaterally  Neuro: Grossly nonfocal  Data Reviewed: Basic Metabolic Panel:  Recent Labs Lab 05/07/13 0500 05/08/13 0458 05/09/13 0415 05/10/13 1705 05/11/13 0450 05/11/13 2230 05/12/13 0455  NA 138 141 140  --  137  --  136  K 3.2* 2.9* 4.1  --  3.4*  --  4.1  CL 92* 94* 94*  --  89*  --  90*  CO2 35* 39* 41*  --  42*  --  38*  GLUCOSE 329* 251* 199* 407* 190* 450* 193*  BUN 38* 38* 36*  --  32*  --  32*  CREATININE 0.85 0.78 0.76  --  0.76  --  0.79  CALCIUM 9.6 9.3 9.3  --  9.8  --  9.6  MG  --  1.7  --   --   --   --   --    Liver Function Tests: No results found for this basename: AST, ALT, ALKPHOS, BILITOT, PROT, ALBUMIN,  in the last 168 hours No results found for this basename: LIPASE, AMYLASE,  in the last 168  hours CBC:  Recent Labs Lab 05/07/13 0500 05/08/13 0458 05/11/13 0450 05/12/13 0455  WBC 10.5 8.8 12.9* 14.0*  HGB 12.8 13.4 14.1 13.5  HCT 40.3 41.3 43.6 43.4  MCV 84.3 84.5 84.2 84.6  PLT 173 177 186 228   Cardiac Enzymes: No results found for this basename: CKTOTAL, CKMB, CKMBINDEX, TROPONINI,  in the last 168 hours CBG:  Recent Labs Lab 05/11/13 1204 05/11/13 1708 05/11/13 2155 05/12/13 0235 05/12/13 0726  GLUCAP 231* 334* 490* 217* 134*     Scheduled Meds: . allopurinol  200 mg Oral Daily  . calcium-vitamin D  1 tablet Oral BID  . cholecalciferol  1,000 Units Oral Daily  . colchicine  0.6 mg Oral BID  . diltiazem  360 mg Oral Daily  . exemestane  25 mg Oral QPC breakfast  . feeding supplement  237 mL Oral Q2000  . ferrous gluconate  324 mg Oral Q breakfast  . furosemide  40 mg Oral Daily  . guaiFENesin  600 mg Oral BID  . insulin aspart  0-15 Units Subcutaneous TID WC  . insulin aspart  0-5 Units Subcutaneous QHS  . insulin aspart  6 Units Subcutaneous TID WC  . insulin glargine  34 Units Subcutaneous QHS  . ipratropium  0.5 mg Nebulization Q6H  . levalbuterol  0.63 mg Nebulization Q6H  . levofloxacin  750 mg Oral Daily  . metoprolol tartrate  25 mg Oral BID  . multivitamin with minerals  1 tablet Oral Daily  . polyethylene glycol  17 g Oral BID  . potassium chloride SA  20 mEq Oral BID  . predniSONE  60 mg Oral Q breakfast  . senna-docusate  1 tablet Oral BID  . simvastatin  40 mg Oral QHS  . sodium chloride  3 mL Intravenous Q12H  . theophylline  200 mg Oral BID  . Warfarin - Pharmacist Dosing Inpatient   Does not apply q1800   Continuous Infusions:    Marlin Canary, DO  [facility] Pager [phone]  If 7PM-7AM, please contact night-coverage [email or web address] Password [facility] 05/12/2013, 8:36 AM   LOS: 9 days

## 2013-05-12 NOTE — Progress Notes (Signed)
Serum glucose from lab draw is 450. Will cont to monitor.

## 2013-05-12 NOTE — Progress Notes (Signed)
PULMONARY  / CRITICAL CARE MEDICINE  Name: [name] MRN: [id number] DOB: 1941/05/11    ADMISSION DATE:  05/03/2013 CONSULTATION DATE:  05/11/13  REFERRING MD :  [name] PRIMARY SERVICE:  [facility]  CHIEF COMPLAINT:  Dyspnea  BRIEF PATIENT DESCRIPTION: 72 y.o. female with a PMH of atrial fibrillation on chronic coumadin, DM, sarcoid, uterine CA/ Aromasin, and morbid obesity who presented to the hospital of 05/03/13 with the acute onset of near syncope, nausea and vomiting while driving to church. No hematemesis, bile colored vomitus. Had a loose bowel movement an evening prior to admission, but no frank diarrhea. Patient also endorses progressive dyspnea and a cough associated with postnasal drainage, associated with wheezing. Pulmonary consulted for hypoxia- uses O2 2L @ home but needing 4-5L here.   SIGNIFICANT EVENTS / STUDIES:  CTa chest 5/31  LINES / TUBES: PIV  CULTURES:   ANTIBIOTICS: levaquin 5/31>>   SUBJECTIVE:  "Still coughing", brought up scant clear mucus today.    VITAL SIGNS: Temp:  [97.5 F (36.4 C)-98 F (36.7 C)] 97.6 F (36.4 C) (06/01 0655) Pulse Rate:  [65-85] 79 (06/01 0655) Resp:  [18-20] 20 (06/01 0655) BP: (125-137)/(72-83) 135/75 mmHg (06/01 0655) SpO2:  [94 %-95 %] 95 % (06/01 0851) Weight:  [115.803 kg (255 lb 4.8 oz)] 115.803 kg (255 lb 4.8 oz) (06/01 0655)  PHYSICAL EXAMINATION: General: Morbidly obese. Pleasant. No distress semi-recumbent,  Neuro: Oriented and appropriate. Non-focal HEENT:  Dentures, speech clear Neck:  No stridor or JVD Cardiovascular:  IRR, rate controlled AFib by monitor, no M  Lungs: Poor airflow, bibasilar crackles Abdomen:  Soft, non-tender, obese, BS present Musculoskeletal:  Moves all extrems, deconditioned Skin: no rash seen   Recent Labs Lab 05/09/13 0415  05/11/13 0450 05/11/13 2230 05/12/13 0455  NA 140  --  137  --  136  K 4.1  --  3.4*  --  4.1  CL 94*  --  89*  --  90*  CO2 41*  --  42*  --  38*   BUN 36*  --  32*  --  32*  CREATININE 0.76  --  0.76  --  0.79  GLUCOSE 199*  < > 190* 450* 193*  < > = values in this interval not displayed.  Recent Labs Lab 05/08/13 0458 05/11/13 0450 05/12/13 0455  HGB 13.4 14.1 13.5  HCT 41.3 43.6 43.4  WBC 8.8 12.9* 14.0*  PLT 177 186 228   Dg Chest 2 View  05/10/2013   *RADIOLOGY REPORT*  Clinical Data: Cough.  Shortness of breath.  CHEST - 2 VIEW  Comparison: PA and lateral chest 05/06/2013.  Findings: Again seen is cardiomegaly.  No pulmonary edema.  There is airspace disease in the left lower lobe best seen on the lateral view.  Right lung appears clear.  No pneumothorax or pleural fluid.  IMPRESSION:  1.  Left lower lobe airspace disease worrisome for pneumonia. 2.  Cardiomegaly without edema.   Original Report Authenticated By: Holley Dexter, M.D.   Ct Angio Chest Pe W/cm &/or Wo Cm  05/11/2013   *RADIOLOGY REPORT*  Clinical Data: Hypoxia.  Risk for pulmonary embolism.  Pneumonia. Sarcoidosis.  History of breast cancer with right lumpectomy and radiation therapy in 2012.  Uterine cancer.  CT ANGIOGRAPHY CHEST  Technique:  Multidetector CT imaging of the chest using the standard protocol during bolus administration of intravenous contrast. Multiplanar reconstructed images including MIPs were obtained and reviewed to evaluate the vascular anatomy.  Contrast:  OMNIPAQUE IOHEXOL 350 MG/ML SOLN  Comparison: .1 day prior plain film.  No recent chest CT.  Findings: Lung windows demonstrate mild motion degradation. Bronchial wall thickening to the lower lobes is nonspecific. Pleural-based right upper lobe lung nodule which measures 5 mm on image 11/series 7.  Similar findings in the right upper lobe on images 21 and 22.  A central right upper lobe 4 mm nodule on image 26/series 7.  Bibasilar, left greater than right patchy airspace opacities.  Minimal dependent right upper lobe patchy airspace disease as well.  Soft tissue windows:  The quality of  this exam for evaluation of pulmonary embolism is moderate to good. No evidence of pulmonary embolism.  Normal aortic caliber without dissection.  Tortuous descending segment.  Mild cardiomegaly, without pericardial or pleural effusion.  Pretracheal node measures 1.5 cm on image 25/series 4.  Mild right hilar adenopathy 1.7 cm on image 34/series 4.  Small hiatal hernia.  Dilated right internal mammary vein may represent a component of central venous insufficiency.  Similarly, the right azygos vein is prominent.  Limited abdominal imaging demonstrates no significant findings.  Moderate osteopenia.  Accentuation of expected thoracic kyphosis. Hemangioma within the T10 vertebral body.  IMPRESSION:  1.  No evidence pulmonary embolism. 2.  Right greater than left lower lobe predominant airspace disease, most consistent with infection. 3.  Scattered pulmonary nodules.  Nonspecific, especially given the clinical history of sarcoidosis and breast cancer.  Consider short- term CT follow-up at 3 months to exclude metastasis. 4.  Thoracic adenopathy, which could be reactive or due to sarcoidosis.  Metastatic disease cannot be excluded.  This could also be reevaluated at follow-up. 5.  Small hiatal hernia. 6.  Findings which likely represents central venous insufficiency at the SVC.   Original Report Authenticated By: Jeronimo Greaves, M.D.  Meds reviewed  CTa-  Images reviewed and discussed w/ her. Basilar pneumonia, small nodules, no PE ASSESSMENT / PLAN:  1) Acute on chronic respiratory failure-  Probable aspiration pneumonia P- Recommend rapid taper off steroids. Continue levaquin. Mobilize for chest PT.  2) Obesity- hypoventilation   3) CHF/ pulmonary edema likely, associated with fluid overload and initial ProBNP 3000. Now being diuresed.  4) Past hx sarcoid- significant pulmonary activity is would be unusual at this age. Small nonspecific nodules on CT. ACE 68- this is an acute phase reactant and may be elevated  due to pneumonia.  5) Past hx Rx Uterine Ca- don't expect change in this status. Continues Aromasin per Heme-Onc  6)  Asthma with bronchitis- She has preferred and can afford theophylline. Theo level 15.5, which can be enough to cause Nausea, aggravate AFib, but has been her stable dose.    Rec- 1) Theophylline dose reduction to 100 mg twice daily 2)  F/U CT recommended by radiology in 3 months to recheck nodules and nodes. 3) Reflux precautions, with further w/u if appropriate 4) Get off steroids quickly 5)  Manage as aspiration pneumonia- levaquin ok. Will add Flutter.  Terisa Starr, MD Pulmonary and Critical Care Medicine [facility] [phone], [phone]   Alt Pager: [phone]  05/12/2013, 10:52 AM

## 2013-05-12 NOTE — Progress Notes (Signed)
Pt's CBG is now 217. No distress noted.

## 2013-05-12 NOTE — Progress Notes (Signed)
ANTICOAGULATION CONSULT NOTE - Follow Up  Pharmacy Consult for Coumadin Indication: atrial fibrillation  Allergies  Allergen Reactions  . Aspirin     Avoids due to being on blood thinners  . Contrast Media (Iodinated Diagnostic Agents) Nausea And Vomiting  . Ibuprofen     Avoids due to being on blood thinners  . Pravachol Other (See Comments)    Muscle pain  . Pravastatin Sodium     Labs:  Recent Labs  05/10/13 0449 05/11/13 0450 05/12/13 0455  HGB  --  14.1 13.5  HCT  --  43.6 43.4  PLT  --  186 228  LABPROT 27.1* 21.7* 19.5*  INR 2.67* 1.98* 1.71*  CREATININE  --  0.76 0.79    Estimated Creatinine Clearance: 71.4 ml/min (by C-G formula based on Cr of 0.79).   Assessment: 72 yo F on chronic coumadin for atrial fibrillation. Patient presented to the ED 5/23 with acute onset of near syncope, nausea and vomiting.  PTA dose = 6mg  on MWF and 4mg  all other days with last dose 5/22 @ 9PM. INR found elevated at 3.23 on admission.  Coumadin resumed 5/25 when INR therapeutic.  INR subtherapeutic and still decreasing - Doses given inpatient are: 3mg , 5mg , 0.5mg , 0mg  1mg , 4mg  - likely dropping due to smaller doses given a few days back  No bleeding complications noted in chart notes.  Stable CBC  Goal of Therapy:  INR 2-3 Monitor platelets by anticoagulation protocol: Yes   Plan:  1) Repeat 4mg  today 2) Daily INR   Hessie Knows, PharmD, BCPS Pager [phone] 05/12/2013 10:18 AM

## 2013-05-13 LAB — GLUCOSE, CAPILLARY
Glucose-Capillary: 243 mg/dL — ABNORMAL HIGH (ref 70–99)
Glucose-Capillary: 290 mg/dL — ABNORMAL HIGH (ref 70–99)
Glucose-Capillary: 374 mg/dL — ABNORMAL HIGH (ref 70–99)

## 2013-05-13 LAB — PROTIME-INR: Prothrombin Time: 20.2 seconds — ABNORMAL HIGH (ref 11.6–15.2)

## 2013-05-13 MED ORDER — WARFARIN SODIUM 4 MG PO TABS
4.0000 mg | ORAL_TABLET | Freq: Once | ORAL | Status: AC
  Administered 2013-05-13: 4 mg via ORAL
  Filled 2013-05-13: qty 1

## 2013-05-13 MED ORDER — PREDNISONE 50 MG PO TABS
50.0000 mg | ORAL_TABLET | Freq: Every day | ORAL | Status: DC
  Administered 2013-05-14: 50 mg via ORAL
  Filled 2013-05-13 (×2): qty 1

## 2013-05-13 NOTE — Progress Notes (Signed)
Inpatient Diabetes Program Recommendations  AACE/ADA: New Consensus Statement on Inpatient Glycemic Control (2013)  Target Ranges:  Prepandial:   less than 140 mg/dL      Peak postprandial:   less than 180 mg/dL (1-2 hours)      Critically ill patients:  140 - 180 mg/dL   Results for [name], [name] (MRN [id number]) as of 05/13/2013 09:19  Ref. Range 05/12/2013 07:26 05/12/2013 11:23 05/12/2013 16:16 05/12/2013 21:22 05/13/2013 07:22  Glucose-Capillary Latest Range: 70-99 mg/dL 865 (H) 784 (H) 696 (H) 282 (H) 167 (H)    Inpatient Diabetes Program Recommendations Correction (SSI): Please increase Novolog correction to resistant scale. Insulin - Meal Coverage: Please consider increasing Novolog meal coverage to 8 units TID with meals.  Note: Postprandial blood glucose consistently elevated and patient remains on Prednisone.  Please increase Novolog correction to resistant scale and increase Novolog meal coverage to 8 units TID with meals to improve inpatient glycemic control.     Thanks, Orlando Penner, RN, MSN, CCRN Diabetes Coordinator Inpatient Diabetes Program [phone]

## 2013-05-13 NOTE — Progress Notes (Signed)
ANTICOAGULATION CONSULT NOTE - Follow Up  Pharmacy Consult for Coumadin Indication: atrial fibrillation  Allergies  Allergen Reactions  . Aspirin     Avoids due to being on blood thinners  . Contrast Media (Iodinated Diagnostic Agents) Nausea And Vomiting  . Ibuprofen     Avoids due to being on blood thinners  . Pravachol Other (See Comments)    Muscle pain  . Pravastatin Sodium     Labs:  Recent Labs  05/11/13 0450 05/12/13 0455 05/13/13 0410  HGB 14.1 13.5  --   HCT 43.6 43.4  --   PLT 186 228  --   LABPROT 21.7* 19.5* 20.2*  INR 1.98* 1.71* 1.79*  CREATININE 0.76 0.79  --     Estimated Creatinine Clearance: 71.8 ml/min (by C-G formula based on Cr of 0.79).   Assessment: 72 yo F on chronic coumadin for atrial fibrillation. Patient presented to the ED 5/23 with acute onset of near syncope, nausea and vomiting.  PTA dose = 6mg  on MWF and 4mg  all other days with last dose 5/22 @ 9PM. INR found elevated at 3.23 on admission.  Coumadin resumed 5/25 when INR therapeutic.  INR subtherapeutic- Doses given inpatient are: 3mg , 5mg , 0.5mg , 0mg  1mg , 4mg , 4mg  - likely dropped due to smaller doses given a few days back but INR increasing now.  Drug interaction with Levaquin (started 5/31) may cause increase in INR.  No bleeding complications noted in chart notes.  Stable CBC  Goal of Therapy:  INR 2-3 Monitor platelets by anticoagulation protocol: Yes   Plan:  1) Warfarin 4mg  today 2) Daily INR   Clance Boll, PharmD, BCPS Pager: [phone] 05/13/2013 8:04 AM

## 2013-05-13 NOTE — Progress Notes (Signed)
Patient ID: [name], female   DOB: February 18, 1941, 72 y.o.   MRN: [id number]  [facility] PROGRESS NOTE  [name]:[id number] DOB: Aug 14, 1941 DOA: 05/03/2013 PCP: [name], MD  Brief narrative:  72 y.o. female with a PMH of atrial fibrillation on chronic coumadin, DM, sarcoid, and morbid obesity who presented to the hospital of 05/03/13 with the acute onset of near syncope, nausea and vomiting while driving to church. No hematemesis, bile colored vomitus. Had a loose bowel movement an evening prior to admission, but no frank diarrhea. Patient also endorses progressive dyspnea and a cough associated with postnasal drainage, associated with wheezing.   Principal Problem:  Near syncope associated with nausea and vomiting  - unclear etiology but now resolved  - continue to advance diet as pt able to tolerate  - no events on telemetry, CE x 4 sets negative - PT evaluation done, recommendation is for HHPT  Acute on chronic resp failure -on 2L at home but have been unable to wean off O2 more then 4-5 L -have diuresed- BNP lower, chest x ray clear of fluid - on levaquin/steriods oral- weaned from IV --appreciate pulm's guidance- CTA neg for PE  PULMONARY SARCOIDOSIS  - ask pulm to see -on steroids -ACE level seems to be phase reactant  Atrial fibrillation  - rate controlled, continue Coumadin per pharmacy, continue Diltiazem and Metoprolol   CHF, acute diastolic dysfunction - lower extremity edema, crackles on lung exam, and vascular congestion on CXR noted, BNP > 3000 - 2 D ECHO 5/25 with normal systolic function but grade I diastolic dysfunction  - will have to monitor daily weights (weight was up 3 kg), I's and O's, daily BMP - change to PO now that I think she is at baseline wieght   Obstructive sleep apnea/OHS  - CPAP at night time   Diabetes mellitus, uncontrolled with neuropathy  - lantus +SSI  COPD exacerbation  - appears to be clinically stable -  tapered down dose of Solumedrol and transition to PO prednisone - abx -weaned off O2 back to 2L  Hypokalemia - replete   Consultants:  pulm  Antibiotics:  Zithromax 5/26 -->levaquin 6/1  Code Status: Full  Family Communication: Pt said she would update family Disposition Plan: H/h  HPI/Subjective: Breathing better today No CP No abd pain  Objective: Filed Vitals:   05/12/13 1923 05/12/13 2124 05/13/13 0600 05/13/13 0734  BP:  133/66 118/59   Pulse:  88 58   Temp:  97.7 F (36.5 C) 97.7 F (36.5 C)   TempSrc:  Oral Oral   Resp:  18 18   Height:      Weight:   116.8 kg (257 lb 8 oz)   SpO2: 95% 95% 97% 95%    Intake/Output Summary (Last 24 hours) at 05/13/13 1334 Last data filed at 05/13/13 1100  Gross per 24 hour  Intake    720 ml  Output   1950 ml  Net  -1230 ml    Exam:   General:  Pt is alert, follows commands appropriately, chronically ill appearing  Cardiovascular: Regular rate and rhythm, S1/S2, no murmurs, no rubs, no gallops  Respiratory:rhonci and crackles, no wheezing  Abdomen: Soft, non tender, non distended, bowel sounds present, no guarding  Extremities: +decreased edema, pulses DP and PT palpable bilaterally  Neuro: Grossly nonfocal  Data Reviewed: Basic Metabolic Panel:  Recent Labs Lab 05/07/13 0500 05/08/13 0458 05/09/13 0415 05/10/13 1705 05/11/13 0450 05/11/13 2230 05/12/13 0455  NA 138 141 140  --  137  --  136  K 3.2* 2.9* 4.1  --  3.4*  --  4.1  CL 92* 94* 94*  --  89*  --  90*  CO2 35* 39* 41*  --  42*  --  38*  GLUCOSE 329* 251* 199* 407* 190* 450* 193*  BUN 38* 38* 36*  --  32*  --  32*  CREATININE 0.85 0.78 0.76  --  0.76  --  0.79  CALCIUM 9.6 9.3 9.3  --  9.8  --  9.6  MG  --  1.7  --   --   --   --   --    Liver Function Tests: No results found for this basename: AST, ALT, ALKPHOS, BILITOT, PROT, ALBUMIN,  in the last 168 hours No results found for this basename: LIPASE, AMYLASE,  in the last 168  hours CBC:  Recent Labs Lab 05/07/13 0500 05/08/13 0458 05/11/13 0450 05/12/13 0455  WBC 10.5 8.8 12.9* 14.0*  HGB 12.8 13.4 14.1 13.5  HCT 40.3 41.3 43.6 43.4  MCV 84.3 84.5 84.2 84.6  PLT 173 177 186 228   Cardiac Enzymes: No results found for this basename: CKTOTAL, CKMB, CKMBINDEX, TROPONINI,  in the last 168 hours CBG:  Recent Labs Lab 05/12/13 1123 05/12/13 1616 05/12/13 2122 05/13/13 0722 05/13/13 1130  GLUCAP 305* 335* 282* 167* 243*     Scheduled Meds: . allopurinol  200 mg Oral Daily  . calcium-vitamin D  1 tablet Oral BID  . cholecalciferol  1,000 Units Oral Daily  . colchicine  0.6 mg Oral BID  . diltiazem  360 mg Oral Daily  . exemestane  25 mg Oral QPC breakfast  . feeding supplement  237 mL Oral Q2000  . ferrous gluconate  324 mg Oral Q breakfast  . furosemide  40 mg Oral Daily  . guaiFENesin  600 mg Oral BID  . insulin aspart  0-15 Units Subcutaneous TID WC  . insulin aspart  0-5 Units Subcutaneous QHS  . insulin aspart  6 Units Subcutaneous TID WC  . insulin glargine  34 Units Subcutaneous QHS  . ipratropium  0.5 mg Nebulization Q6H  . levalbuterol  0.63 mg Nebulization Q6H  . levofloxacin  750 mg Oral Daily  . metoprolol tartrate  25 mg Oral BID  . multivitamin with minerals  1 tablet Oral Daily  . polyethylene glycol  17 g Oral BID  . potassium chloride SA  20 mEq Oral BID  . [START ON 05/14/2013] predniSONE  50 mg Oral Q breakfast  . senna-docusate  1 tablet Oral BID  . simvastatin  40 mg Oral QHS  . sodium chloride  3 mL Intravenous Q12H  . theophylline  200 mg Oral BID  . warfarin  4 mg Oral ONCE-1800  . Warfarin - Pharmacist Dosing Inpatient   Does not apply q1800   Continuous Infusions:    Marlin Canary, DO  [facility] Pager [phone]  If 7PM-7AM, please contact night-coverage [email or web address] Password [email or web address] 05/13/2013, 1:34 PM   LOS: 10 days

## 2013-05-13 NOTE — Progress Notes (Signed)
Physical Therapy Treatment Patient Details Name: [name] MRN: [id number] DOB: 05-28-1941 Today's Date: 05/13/2013 Time: 6213-0865 PT Time Calculation (min): 25 min  PT Assessment / Plan / Recommendation Comments on Treatment Session  Pt feeling much better today and able to tolerate 220 feet on 3L O2 [name] with min/guard assist.  Pt feels able to d/c home once "doctor feels I'm ready."  Pt encouraged to take rest breaks as needed and sit down if she starts to feel dizzy (slight dizziness end of ambulation today).    Follow Up Recommendations  Home health PT     Does the patient have the potential to tolerate intense rehabilitation     Barriers to Discharge        Equipment Recommendations  None recommended by PT    Recommendations for Other Services    Frequency     Plan Discharge plan remains appropriate;Frequency remains appropriate    Precautions / Restrictions Precautions Precaution Comments: chronic 2L Restrictions Weight Bearing Restrictions: No   Pertinent Vitals/Pain SaO2 98% on 3L at rest SaO2 94% on 3L during ambulation    Mobility  Bed Mobility Bed Mobility: Supine to Sit Supine to Sit: 5: Supervision Details for Bed Mobility Assistance: increased time Transfers Transfers: Sit to Stand;Stand to Sit;Stand Pivot Transfers Sit to Stand: 5: Supervision;From bed;From chair/3-in-1 Stand to Sit: 5: Supervision;To bed;To chair/3-in-1 Details for Transfer Assistance: increased time and able to steady with use of BSC armrests Ambulation/Gait Ambulation/Gait Assistance: 4: Min guard Ambulation Distance (Feet): 220 Feet Assistive device: 4-wheeled walker Ambulation/Gait Assistance Details: pt with very slow gait however able to tolerate better distance today and denies SOB however did report some dizziness end of ambulation but felt able to make it back to recliner, discussed seated rest break at home if dizziness occurs for safety Gait Pattern: Step-through  pattern;Trunk flexed;Decreased stride length Gait velocity: decreased General Gait Details: SaO2 94% on 3L O2 [name]    Exercises     PT Diagnosis:    PT Problem List:   PT Treatment Interventions:     PT Goals Acute Rehab PT Goals PT Goal: Sit to Stand - Progress: Progressing toward goal PT Goal: Stand to Sit - Progress: Progressing toward goal PT Goal: Ambulate - Progress: Progressing toward goal  Visit Information  Last PT Received On: 05/13/13 Assistance Needed: +1    Subjective Data  Subjective: I'm feeling much better. Patient Stated Goal: home   Cognition  Cognition Arousal/Alertness: Awake/alert Behavior During Therapy: WFL for tasks assessed/performed Overall Cognitive Status: Within Functional Limits for tasks assessed    Balance     End of Session PT - End of Session Equipment Utilized During Treatment: Oxygen Activity Tolerance: Patient tolerated treatment well Patient left: in chair;with call bell/phone within reach;with chair alarm set   GP     Pravin Perezperez,KATHrine E 05/13/2013, 10:30 AM Zenovia Jarred, PT, DPT 05/13/2013 Pager: [phone]

## 2013-05-13 NOTE — Progress Notes (Signed)
PULMONARY  / CRITICAL CARE MEDICINE  Name: JESELLE HISER MRN: [id number] DOB: Sep 29, 1941    ADMISSION DATE:  05/03/2013 CONSULTATION DATE:  05/11/13  REFERRING MD :  [name] PRIMARY SERVICE:  TRH  CHIEF COMPLAINT:  Dyspnea  BRIEF PATIENT DESCRIPTION: 72 y.o. female with a PMH of atrial fibrillation on chronic coumadin, DM, sarcoid, uterine CA/ Aromasin, and morbid obesity who presented to the hospital of 05/03/13 with the acute onset of near syncope, nausea and vomiting while driving to church. No hematemesis, bile colored vomitus. Had a loose bowel movement an evening prior to admission, but no frank diarrhea. Patient also endorses progressive dyspnea and a cough associated with postnasal drainage, associated with wheezing. Pulmonary consulted 5/31 for hypoxia- uses O2 2L @ home but needing 4-5L here.   SIGNIFICANT EVENTS / STUDIES:  CTa chest 5/31> see report      ANTIBIOTICS: levaquin 5/31>>   SUBJECTIVE:  Feels better today though not active yet Comfortable on 3lpm    VITAL SIGNS: Temp:  [97.6 F (36.4 C)-97.7 F (36.5 C)] 97.7 F (36.5 C) (06/02 0600) Pulse Rate:  [58-88] 58 (06/02 0600) Resp:  [18-20] 18 (06/02 0600) BP: (118-137)/(59-79) 118/59 mmHg (06/02 0600) SpO2:  [95 %-97 %] 95 % (06/02 0734) Weight:  [116.8 kg (257 lb 8 oz)] 116.8 kg (257 lb 8 oz) (06/02 0600)  PHYSICAL EXAMINATION: General:  Pleasant. No distress semi-recumbent,  Neuro: Oriented and appropriate. Non-focal HEENT:  Dentures, speech clear Neck:  No stridor or JVD Cardiovascular:  IRR, rate controlled AFib by monitor, no M  Lungs: Poor airflow, bibasilar crackles Abdomen:  Soft, non-tender, obese, BS present Musculoskeletal:  Moves all extrems, deconditioned Skin: no rash seen   Recent Labs Lab 05/09/13 0415  05/11/13 0450 05/11/13 2230 05/12/13 0455  NA 140  --  137  --  136  K 4.1  --  3.4*  --  4.1  CL 94*  --  89*  --  90*  CO2 41*  --  42*  --  38*  BUN 36*  --  32*  --  32*   CREATININE 0.76  --  0.76  --  0.79  GLUCOSE 199*  < > 190* 450* 193*  < > = values in this interval not displayed.  Recent Labs Lab 05/08/13 0458 05/11/13 0450 05/12/13 0455  HGB 13.4 14.1 13.5  HCT 41.3 43.6 43.4  WBC 8.8 12.9* 14.0*  PLT 177 186 228   Ct Angio Chest Pe W/cm &/or Wo Cm  05/11/2013   *RADIOLOGY REPORT*  Clinical Data: Hypoxia.  Risk for pulmonary embolism.  Pneumonia. Sarcoidosis.  History of breast cancer with right lumpectomy and radiation therapy in 2012.  Uterine cancer.  CT ANGIOGRAPHY CHEST  Technique:  Multidetector CT imaging of the chest using the standard protocol during bolus administration of intravenous contrast. Multiplanar reconstructed images including MIPs were obtained and reviewed to evaluate the vascular anatomy.  Contrast: OMNIPAQUE IOHEXOL 350 MG/ML SOLN  Comparison: .1 day prior plain film.  No recent chest CT.  Findings: Lung windows demonstrate mild motion degradation. Bronchial wall thickening to the lower lobes is nonspecific. Pleural-based right upper lobe lung nodule which measures 5 mm on image 11/series 7.  Similar findings in the right upper lobe on images 21 and 22.  A central right upper lobe 4 mm nodule on image 26/series 7.  Bibasilar, left greater than right patchy airspace opacities.  Minimal dependent right upper lobe patchy airspace disease as well.  Soft tissue windows:  The quality of this exam for evaluation of pulmonary embolism is moderate to good. No evidence of pulmonary embolism.  Normal aortic caliber without dissection.  Tortuous descending segment.  Mild cardiomegaly, without pericardial or pleural effusion.  Pretracheal node measures 1.5 cm on image 25/series 4.  Mild right hilar adenopathy 1.7 cm on image 34/series 4.  Small hiatal hernia.  Dilated right internal mammary vein may represent a component of central venous insufficiency.  Similarly, the right azygos vein is prominent.  Limited abdominal imaging demonstrates no  significant findings.  Moderate osteopenia.  Accentuation of expected thoracic kyphosis. Hemangioma within the T10 vertebral body.  IMPRESSION:  1.  No evidence pulmonary embolism. 2.  Right greater than left lower lobe predominant airspace disease, most consistent with infection. 3.  Scattered pulmonary nodules.  Nonspecific, especially given the clinical history of sarcoidosis and breast cancer.  Consider short- term CT follow-up at 3 months to exclude metastasis. 4.  Thoracic adenopathy, which could be reactive or due to sarcoidosis.  Metastatic disease cannot be excluded.  This could also be reevaluated at follow-up. 5.  Small hiatal hernia. 6.  Findings which likely represents central venous insufficiency at the SVC.   Original Report Authenticated By: Jeronimo Greaves, M.D.        ASSESSMENT / PLAN:  1) Acute on chronic respiratory failure-  Probable aspiration pneumonia P- Recommend rapid taper off steroids. Continue levaquin. Mobilize for chest PT.  2) Obesity- hypoventilation > has home set up already plus 02 and f/u with Dr Maple Hudson planned  3) CHF/ pulmonary edema likely, associated with fluid overload and initial ProBNP 3000. Now being diuresed.  4) Past hx sarcoid- significant pulmonary activity is would be unusual at this age. Small nonspecific nodules on CT. ACE 68- this is an acute phase reactant and may be elevated due to pneumonia.  5) Past hx Rx Uterine Ca- don't expect change in this status. Continues Aromasin per Heme-Onc  6)  Asthma with bronchitis- She has preferred and can afford theophylline. Theo level 15.5, which can be enough to cause Nausea, aggravate AFib>  I don't see role for it longterm but will defer to Dr Maple Hudson since he is managing as outpt   Rec- 1) Theophylline 100 mg twice daily and consider complete taper off 2)  F/U CT recommended by radiology in 3 months to recheck nodules and nodes. 3) Reflux precautions, with further w/u if appropriate 4) Get off steroids  quickly 5)  Manage as aspiration pneumonia- levaquin ok. Flutter added.    Sandrea Hughs, MD Pulmonary and Critical Care Medicine [facility] Cell [phone] After 5:30 PM or weekends, call [phone]

## 2013-05-14 LAB — PROTIME-INR: INR: 1.68 — ABNORMAL HIGH (ref 0.00–1.49)

## 2013-05-14 MED ORDER — PREDNISONE 50 MG PO TABS: ORAL_TABLET | ORAL | Status: DC

## 2013-05-14 MED ORDER — WARFARIN SODIUM 6 MG PO TABS: 6.0000 mg | ORAL_TABLET | Freq: Every day | ORAL | Status: DC

## 2013-05-14 MED ORDER — WARFARIN SODIUM 6 MG PO TABS
6.0000 mg | ORAL_TABLET | Freq: Once | ORAL | Status: DC
  Filled 2013-05-14: qty 1

## 2013-05-14 MED ORDER — HYDROCOD POLST-CHLORPHEN POLST 10-8 MG/5ML PO LQCR: 5.0000 mL | Freq: Two times a day (BID) | ORAL | Status: DC | PRN

## 2013-05-14 MED ORDER — INSULIN GLARGINE 100 UNIT/ML ~~LOC~~ SOLN: 28.0000 [IU] | Freq: Every day | SUBCUTANEOUS | Status: DC

## 2013-05-14 MED ORDER — LEVOFLOXACIN 750 MG PO TABS: 750.0000 mg | ORAL_TABLET | Freq: Every day | ORAL | Status: DC

## 2013-05-14 MED ORDER — GUAIFENESIN ER 600 MG PO TB12: 600.0000 mg | ORAL_TABLET | Freq: Two times a day (BID) | ORAL | Status: DC

## 2013-05-14 MED ORDER — THEOPHYLLINE ER 200 MG PO TB12: 100.0000 mg | ORAL_TABLET | Freq: Two times a day (BID) | ORAL | Status: DC

## 2013-05-14 MED ORDER — HYDROCODONE-ACETAMINOPHEN 5-325 MG PO TABS: 1.0000 | ORAL_TABLET | Freq: Four times a day (QID) | ORAL | Status: DC | PRN

## 2013-05-14 MED ORDER — FUROSEMIDE 40 MG PO TABS: 40.0000 mg | ORAL_TABLET | Freq: Every day | ORAL | Status: DC

## 2013-05-14 NOTE — Discharge Summary (Signed)
Physician Discharge Summary  [name]:[id number] DOB: 07-29-1941 DOA: 05/03/2013  PCP: [name], MD  Admit date: 05/03/2013 Discharge date: 05/14/2013  Time spent: 35 minutes  Recommendations for Outpatient Follow-up:  1. Home health 2. PT/INR on Thurs- H/H to draw- on abx 3. BMP 1 week- given lasix 4. Need to follow up with Dr. Maple Hudson for weaning of theophylline 5. CT scan 3 months for nodules/nodes to be rechecked 6. lantus will need to be weaned as steroids are decreased  Discharge Diagnoses:  Principal Problem:   Near syncope associated with nausea and vomiting Active Problems:   PULMONARY SARCOIDOSIS   Atrial fibrillation   Obstructive sleep apnea   Obesity hypoventilation syndrome   Gout   Diabetes mellitus   COPD exacerbation   Hyperlipidemia   Aspiration pneumonia   Discharge Condition: improved  Diet recommendation: cardiac/diabetic  Filed Weights   05/12/13 0655 05/13/13 0600 05/14/13 0416  Weight: 115.803 kg (255 lb 4.8 oz) 116.8 kg (257 lb 8 oz) 116.5 kg (256 lb 13.4 oz)    History of present illness:  [name] is an 72 y.o. female with a PMH of atrial fibrillation on chronic coumadin, DM, sarcoid, and morbid obesity who presented to the hospital of 05/03/13 with the acute onset of near syncope, nausea and vomiting while driving to church. No hematemesis, bile colored vomitus. Had a loose bowel movement last evening, but no frank diarrhea. Patient also endorses progressive dyspnea and a cough associated with postnasal drainage. She notes that she has been wheezing today. No recent illnesses or sick contacts. No recent antibiotic use. Feels better now after having received Zofran in the emergency department.    Hospital Course:  Near syncope associated with nausea and vomiting  - unclear etiology but now resolved  - continue to advance diet as pt able to tolerate  - no events on telemetry, CE x 4 sets negative  - PT evaluation done,  recommendation is for HHPT   Acute on chronic resp failure  -on 2L at home but have been unable to wean off O2 more then 4-5 L  -have diuresed- BNP lower, chest x ray clear of fluid  - on levaquin/steriods oral- weaned from IV  --appreciate pulm's guidance- CTA neg for PE   PULMONARY SARCOIDOSIS  - ask pulm to see  -on steroids  -ACE level seems to be phase reactant   Atrial fibrillation  - rate controlled, continue Coumadin   CHF, acute diastolic dysfunction  - lower extremity edema, crackles on lung exam, and vascular congestion on CXR noted, BNP > 3000  - 2 D ECHO 5/25 with normal systolic function but grade I diastolic dysfunction   - change to PO now that I think she is at baseline wieght  -BMP outpatient  Obstructive sleep apnea/OHS  - CPAP at night time   Diabetes mellitus, uncontrolled with neuropathy  - lantus +SSI  -will need weaning of lantus  COPD exacerbation  - appears to be clinically stable  - tapered down dose of Solumedrol and transition to PO prednisone  - abx  -weaned off O2 back to 2L   Hypokalemia  - replete   Procedures:  See below  Consultations:  pulm  Discharge Exam: Filed Vitals:   05/14/13 0416 05/14/13 0433 05/14/13 0753 05/14/13 1300  BP:  118/61  138/68  Pulse:  68  89  Temp:  98 F (36.7 C)  97.5 F (36.4 C)  TempSrc:  Oral  Oral  Resp:  19  16  Height:      Weight: 116.5 kg (256 lb 13.4 oz)     SpO2:  93% 98% 97%    General: A+Ox3, NAD- breathing better back to 2L Cardiovascular: irregular Respiratory: occasional course breath sounds  Discharge Instructions      Discharge Orders   Future Appointments Provider Department Dept Phone   09/09/2013 10:30 AM Krista Blue [facility] [phone]   09/09/2013 11:00 AM Victorino December, MD [facility] MEDICAL ONCOLOGY [phone]   11/04/2013 11:00 AM Waymon Budge, MD [facility] [phone]   Future Orders  Complete By Expires     Diet - low sodium heart healthy  As directed     Diet Carb Modified  As directed     Discharge instructions  As directed     Comments:      Home health Will need to decrease lantus as steroids weaned off as Blood sugar will drop BMP 1 week re K and Cr INR on Thursday    Increase activity slowly  As directed         Medication List    STOP taking these medications       ferrous gluconate 246 (28 FE) MG tablet  Commonly known as:  FERGON     polyethylene glycol packet  Commonly known as:  MIRALAX / GLYCOLAX     torsemide 20 MG tablet  Commonly known as:  DEMADEX      TAKE these medications       acetaminophen 500 MG tablet  Commonly known as:  TYLENOL  Take 1,000 mg by mouth every 6 (six) hours as needed. pain     alendronate 70 MG tablet  Commonly known as:  FOSAMAX  Take 70 mg by mouth every 7 (seven) days. Take with a full glass of water on an empty stomach. Do not sit for 30 minutes.  Takes on saturday     allopurinol 100 MG tablet  Commonly known as:  ZYLOPRIM  Take 200 mg by mouth daily.     CALCIUM 600 + D PO  Take 1 tablet by mouth 2 (two) times daily.     chlorpheniramine-HYDROcodone 10-8 MG/5ML Lqcr  Commonly known as:  TUSSIONEX  Take 5 mLs by mouth every 12 (twelve) hours as needed.     cholecalciferol 1000 UNITS tablet  Commonly known as:  VITAMIN D  Take 1 tablet (1,000 Units total) by mouth daily.     colchicine 0.6 MG tablet  Take 1 tablet (0.6 mg total) by mouth 2 (two) times daily.     diltiazem 360 MG 24 hr capsule  Commonly known as:  TIAZAC  Take 1 tablet by mouth daily.     exemestane 25 MG tablet  Commonly known as:  AROMASIN  Take 1 tablet (25 mg total) by mouth daily after breakfast.     furosemide 40 MG tablet  Commonly known as:  LASIX  Take 1 tablet (40 mg total) by mouth daily.     guaiFENesin 600 MG 12 hr tablet  Commonly known as:  MUCINEX  Take 1 tablet (600 mg total) by mouth 2 (two) times  daily.     HUMULIN N 100 UNIT/ML injection  Generic drug:  insulin NPH  Inject 5 Units into the skin as needed (5 units if blood sugar is greater than 150).     HYDROcodone-acetaminophen 5-325 MG per tablet  Commonly known as:  NORCO/VICODIN  Take 1 tablet by mouth every 6 (six) hours as needed. For pain.     insulin glargine 100 UNIT/ML injection  Commonly known as:  LANTUS  Inject 0.28 mLs (28 Units total) into the skin at bedtime.     ipratropium 0.02 % nebulizer solution  Commonly known as:  ATROVENT  Take 2.5 mLs (0.5 mg total) by nebulization every 6 (six) hours.     levalbuterol 0.63 MG/3ML nebulizer solution  Commonly known as:  XOPENEX  Take 3 mLs (0.63 mg total) by nebulization every 6 (six) hours.     levofloxacin 750 MG tablet  Commonly known as:  LEVAQUIN  Take 1 tablet (750 mg total) by mouth daily.     LORazepam 0.5 MG tablet  Commonly known as:  ATIVAN  Take 0.5 mg by mouth at bedtime as needed. For sleep.     meclizine 25 MG tablet  Commonly known as:  ANTIVERT  Take 6.25-12.5 mg by mouth 3 (three) times daily as needed. For dizziness.     metFORMIN 500 MG tablet  Commonly known as:  GLUCOPHAGE  Take 500 mg by mouth 2 (two) times daily with a meal. If cbg is greater than 120 pt takes medication     metoprolol tartrate 25 MG tablet  Commonly known as:  LOPRESSOR  Take 25 mg by mouth 2 (two) times daily.     multivitamin with minerals Tabs  Take 1 tablet by mouth daily.     potassium chloride SA 20 MEQ tablet  Commonly known as:  K-DUR,KLOR-CON  Take 20 mEq by mouth 2 (two) times daily.     predniSONE 50 MG tablet  Commonly known as:  DELTASONE  40 mg x 3 days, 30 mg x 3 days, 20 mg x 3 days, 10 mg x 3 days     promethazine 25 MG tablet  Commonly known as:  PHENERGAN  Take 25 mg by mouth every 6 (six) hours as needed. For nausea.     simvastatin 40 MG tablet  Commonly known as:  ZOCOR  Take 40 mg by mouth at bedtime.     theophylline 200 MG  12 hr tablet  Commonly known as:  THEODUR  Take 0.5 tablets (100 mg total) by mouth 2 (two) times daily.     warfarin 6 MG tablet  Commonly known as:  COUMADIN  Take 1 tablet (6 mg total) by mouth daily.       Allergies  Allergen Reactions  . Aspirin     Avoids due to being on blood thinners  . Contrast Media (Iodinated Diagnostic Agents) Nausea And Vomiting  . Ibuprofen     Avoids due to being on blood thinners  . Pravachol Other (See Comments)    Muscle pain  . Pravastatin Sodium    Follow-up Information   Follow up with [name], MD In 1 week.   Contact information:   [facility] [address] [place] [address] [phone]       Follow up with Waymon Budge, MD In 2 weeks.   Contact information:   [address]  [facility] [place] [address] [phone]        The results of significant diagnostics from this hospitalization (including imaging, microbiology, ancillary and laboratory) are listed below for reference.    Significant Diagnostic Studies: Dg Chest 2 View  05/10/2013   *RADIOLOGY REPORT*  Clinical Data: Cough.  Shortness of breath.  CHEST -  2 VIEW  Comparison: PA and lateral chest 05/06/2013.  Findings: Again seen is cardiomegaly.  No pulmonary edema.  There is airspace disease in the left lower lobe best seen on the lateral view.  Right lung appears clear.  No pneumothorax or pleural fluid.  IMPRESSION:  1.  Left lower lobe airspace disease worrisome for pneumonia. 2.  Cardiomegaly without edema.   Original Report Authenticated By: Holley Dexter, M.D.   Dg Chest 2 View  05/06/2013   *RADIOLOGY REPORT*  Clinical Data: Cough, congestion, history of CHF, breast cancer, atrial fibrillation  CHEST - 2 VIEW  Comparison: 05/03/2013; 11/19/2012  Findings: Examination is again degraded secondary to patient body habitus.  Grossly unchanged enlarged cardiac silhouette and mediastinal contours given  persistently reduced lung volumes.  The pulmonary vasculature remains chronically indistinct with cephalization of flow.  Grossly unchanged perihilar and bibasilar heterogeneous opacities, left greater than right. Trace bilateral effusions are not excluded.  No pneumothorax.  Unchanged bones.  IMPRESSION: Chronic findings of hypoventilation and pulmonary venous congestion without definite acute cardiopulmonary disease.   Original Report Authenticated By: Tacey Ruiz, MD   Ct Angio Chest Pe W/cm &/or Wo Cm  05/11/2013   *RADIOLOGY REPORT*  Clinical Data: Hypoxia.  Risk for pulmonary embolism.  Pneumonia. Sarcoidosis.  History of breast cancer with right lumpectomy and radiation therapy in 2012.  Uterine cancer.  CT ANGIOGRAPHY CHEST  Technique:  Multidetector CT imaging of the chest using the standard protocol during bolus administration of intravenous contrast. Multiplanar reconstructed images including MIPs were obtained and reviewed to evaluate the vascular anatomy.  Contrast: OMNIPAQUE IOHEXOL 350 MG/ML SOLN  Comparison: .1 day prior plain film.  No recent chest CT.  Findings: Lung windows demonstrate mild motion degradation. Bronchial wall thickening to the lower lobes is nonspecific. Pleural-based right upper lobe lung nodule which measures 5 mm on image 11/series 7.  Similar findings in the right upper lobe on images 21 and 22.  A central right upper lobe 4 mm nodule on image 26/series 7.  Bibasilar, left greater than right patchy airspace opacities.  Minimal dependent right upper lobe patchy airspace disease as well.  Soft tissue windows:  The quality of this exam for evaluation of pulmonary embolism is moderate to good. No evidence of pulmonary embolism.  Normal aortic caliber without dissection.  Tortuous descending segment.  Mild cardiomegaly, without pericardial or pleural effusion.  Pretracheal node measures 1.5 cm on image 25/series 4.  Mild right hilar adenopathy 1.7 cm on image 34/series 4.   Small hiatal hernia.  Dilated right internal mammary vein may represent a component of central venous insufficiency.  Similarly, the right azygos vein is prominent.  Limited abdominal imaging demonstrates no significant findings.  Moderate osteopenia.  Accentuation of expected thoracic kyphosis. Hemangioma within the T10 vertebral body.  IMPRESSION:  1.  No evidence pulmonary embolism. 2.  Right greater than left lower lobe predominant airspace disease, most consistent with infection. 3.  Scattered pulmonary nodules.  Nonspecific, especially given the clinical history of sarcoidosis and breast cancer.  Consider short- term CT follow-up at 3 months to exclude metastasis. 4.  Thoracic adenopathy, which could be reactive or due to sarcoidosis.  Metastatic disease cannot be excluded.  This could also be reevaluated at follow-up. 5.  Small hiatal hernia. 6.  Findings which likely represents central venous insufficiency at the SVC.   Original Report Authenticated By: Jeronimo Greaves, M.D.   US Abdomen Complete  05/03/2013   *RADIOLOGY REPORT*  Clinical Data:  History of gallstones, vomiting  COMPLETE ABDOMINAL ULTRASOUND  Comparison:  None.  Findings:  Gallbladder:  Echogenic foci in the gallbladder neck consistent with a gallstone which measures 1.4 x 0.4 cm.  No significant gallbladder wall thickening.  No pericholecystic fluid.  The gallbladder is not distended.  Per the sonographer, the sonographic Murphy's sign was negative.  Common bile duct:  Within normal limits at 6 mm in caliber.  Liver:  No focal lesion identified.  Within normal limits in parenchymal echogenicity.  IVC:  Infrahepatic IVC is obscured by bowel gas.  The suprahepatic IVC is unremarkable.  Pancreas:  No focal abnormality seen.  Spleen:  The spleen is enlarged at 14.8 x 8 16.3 x 8.8 cm yielding a total splenic volume of 1106 ml.  No focal lesion identified.  Right Kidney:  Normal reniform contour without hydronephrosis or focal solid renal lesion.   The kidney measures 10.7 cm in length.  Left Kidney:  Normal reniform contour without hydronephrosis or focal solid renal lesion.  The kidney measures 11 cm in length.  Abdominal aorta:  No aneurysm identified.  IMPRESSION:  1.  Cholelithiasis without sonographic features to suggest acute cholecystitis.  2.  Splenomegaly without focal splenic lesion.   Original Report Authenticated By: Malachy Moan, M.D.   Ct Abdomen Pelvis W Contrast  05/03/2013   *RADIOLOGY REPORT*  Clinical Data: Acute onset of near syncope, nausea, and vomiting while driving.  Surgical history includes hysterectomy and lumpectomy for breast cancer.  CT ABDOMEN AND PELVIS WITH CONTRAST  Technique:  Multidetector CT imaging of the abdomen and pelvis was performed following the standard protocol during bolus administration of intravenous contrast.  Contrast: 80mL OMNIPAQUE IOHEXOL 300 MG/ML IV. Oral contrast was also administered.  Comparison: None.  Findings: Marked edema/induration involving the subcutaneous fat of the abdominal wall overlying the low pelvis.  Midline surgical scar in the low pelvis from the prior hysterectomy.  No evidence of anterior abdominal wall hernia.  Diffuse hepatic steatosis without focal hepatic parenchymal abnormality.  Normal appearing spleen, pancreas, adrenal glands, and kidneys.  Calcified gallstone within the otherwise normal appearing gallbladder.  No biliary ductal dilation.  Mild aorto- iliofemoral atherosclerosis.  No significant lymphadenopathy.  Small hiatal hernia.  Stomach otherwise normal in appearance. Normal-appearing small bowel.  Extensive diffuse colonic diverticulosis without evidence of acute diverticulitis.  Normal- appearing decompressed appendix in the right upper pelvis field with oral contrast.  No ascites.  Uterus surgically absent.  No adnexal masses or free pelvic fluid. Urinary bladder unremarkable.  Bone window images demonstrate generalized osseous demineralization, degenerative  changes involving the lower thoracic and lumbar spine, lumbar scoliosis convex left, and degenerative changes in the hip joints.  Visualized lung bases clear apart from scarring in the right middle lobe.  Heart mildly enlarged.  IMPRESSION:  1.  Marked edema/induration consistent with panniculitis involving the subcutaneous fat of the anterior abdominal wall overlying the lower pelvis, below the midline surgical scar. 2.  No acute abnormalities otherwise involving the abdomen or pelvis. 3.  Extensive diffuse colonic diverticulosis without evidence of acute diverticulitis. 4.  Cholelithiasis without CT evidence of acute cholecystitis. 5.  Small hiatal hernia.   Original Report Authenticated By: Hulan Saas, M.D.   Dg Abd Acute W/chest  05/03/2013   *RADIOLOGY REPORT*  Clinical Data: Nausea and vomiting.  Current history of diabetes, asthma, breast cancer, and atrial fibrillation.  ACUTE ABDOMEN SERIES (ABDOMEN 2 VIEW & CHEST 1 VIEW)  Comparison: No prior abdominal  imaging.  Two-view chest x-ray 11/19/2012, 12/15/2011.  Findings: Bowel gas pattern unremarkable without evidence of obstruction or significant ileus.  No evidence of free intraperitoneal air on the lateral decubitus image.  5 very small round calcifications clustered in the right upper quadrant.  No abnormal calcifications elsewhere.  Surgical clips in the pelvis. Degenerative changes involving the lumbar spine and generalized osseous demineralization.  Cardiac silhouette mildly enlarged but stable.  Prominent bronchovascular markings diffusely and moderate to marked central peribronchial thickening, similar to prior examinations.  No new pulmonary parenchymal abnormalities.  IMPRESSION:  1.  No acute abdominal abnormality. 2.  Probable small gallstones in the right upper quadrant of the abdomen. 3.  Stable mild cardiomegaly and moderate to severe changes of chronic bronchitis and/or asthma.  No acute cardiopulmonary disease.   Original Report  Authenticated By: Hulan Saas, M.D.    Microbiology: No results found for this or any previous visit (from the past 240 hour(s)).   Labs: Basic Metabolic Panel:  Recent Labs Lab 05/08/13 0458 05/09/13 0415 05/10/13 1705 05/11/13 0450 05/11/13 2230 05/12/13 0455  NA 141 140  --  137  --  136  K 2.9* 4.1  --  3.4*  --  4.1  CL 94* 94*  --  89*  --  90*  CO2 39* 41*  --  42*  --  38*  GLUCOSE 251* 199* 407* 190* 450* 193*  BUN 38* 36*  --  32*  --  32*  CREATININE 0.78 0.76  --  0.76  --  0.79  CALCIUM 9.3 9.3  --  9.8  --  9.6  MG 1.7  --   --   --   --   --    Liver Function Tests: No results found for this basename: AST, ALT, ALKPHOS, BILITOT, PROT, ALBUMIN,  in the last 168 hours No results found for this basename: LIPASE, AMYLASE,  in the last 168 hours No results found for this basename: AMMONIA,  in the last 168 hours CBC:  Recent Labs Lab 05/08/13 0458 05/11/13 0450 05/12/13 0455  WBC 8.8 12.9* 14.0*  HGB 13.4 14.1 13.5  HCT 41.3 43.6 43.4  MCV 84.5 84.2 84.6  PLT 177 186 228   Cardiac Enzymes: No results found for this basename: CKTOTAL, CKMB, CKMBINDEX, TROPONINI,  in the last 168 hours BNP: BNP (last 3 results)  Recent Labs  05/05/13 0519 05/06/13 0515 05/11/13 0450  PROBNP 3589.0* 3322.0* 1374.0*   CBG:  Recent Labs Lab 05/13/13 1130 05/13/13 1639 05/13/13 2038 05/14/13 0719 05/14/13 1116  GLUCAP 243* 290* 374* 165* 170*       Signed:  Zakkery Dorian  [facility] 05/14/2013, 2:18 PM

## 2013-05-14 NOTE — Progress Notes (Addendum)
ANTICOAGULATION CONSULT NOTE - Follow Up  Pharmacy Consult for Coumadin Indication: atrial fibrillation  Allergies  Allergen Reactions  . Aspirin     Avoids due to being on blood thinners  . Contrast Media (Iodinated Diagnostic Agents) Nausea And Vomiting  . Ibuprofen     Avoids due to being on blood thinners  . Pravachol Other (See Comments)    Muscle pain  . Pravastatin Sodium     Labs:  Recent Labs  05/12/13 0455 05/13/13 0410 05/14/13 0454  HGB 13.5  --   --   HCT 43.4  --   --   PLT 228  --   --   LABPROT 19.5* 20.2* 19.2*  INR 1.71* 1.79* 1.68*  CREATININE 0.79  --   --     Estimated Creatinine Clearance: 71.7 ml/min (by C-G formula based on Cr of 0.79).   Assessment: 72 yo F on chronic coumadin for atrial fibrillation. Patient presented to the ED 5/23 with acute onset of near syncope, nausea and vomiting.  PTA dose = 6mg  on MWF and 4mg  all other days with last dose 5/22 @ 9PM. INR found elevated at 3.23 on admission.  Coumadin resumed 5/25 when INR therapeutic.  INR subtherapeutic- Doses given inpatient are: 3mg , 5mg , 0.5mg , 0mg  1mg , 4mg , 4mg , 4mg  - contributed drop due to smaller doses given a few days back but INR should be increasing by now.  Will give larger dose today.  Drug interaction with Levaquin (started 5/31) may cause increase in INR.  No bleeding complications noted in chart notes.  Stable CBC  Goal of Therapy:  INR 2-3 Monitor platelets by anticoagulation protocol: Yes   Plan:  1) Warfarin 6mg  today 2) Daily INR 3) For discharge planning, would resume patient's home warfarin dosing of 6mg  on MWF and 4mg  all other days with INR check in 2 days.  Close INR monitoring recommended until stable since patient on Levaquin and INR response has been variable during admission.   Clance Boll, PharmD, BCPS Pager: [phone] 05/14/2013 8:32 AM

## 2013-05-14 NOTE — Progress Notes (Signed)
PULMONARY  / CRITICAL CARE MEDICINE  Name: BLANCH STANG MRN: [id number] DOB: 10/07/1941    ADMISSION DATE:  05/03/2013 CONSULTATION DATE:  05/11/13  REFERRING MD :  [name] PRIMARY SERVICE:  TRH  CHIEF COMPLAINT:  Dyspnea  BRIEF PATIENT DESCRIPTION: 72 y.o. female with a PMH of atrial fibrillation on chronic coumadin, DM, sarcoid, uterine CA/ Aromasin, and morbid obesity who presented to the hospital of 05/03/13 with the acute onset of near syncope, nausea and vomiting while driving to church. No hematemesis, bile colored vomitus. Had a loose bowel movement an evening prior to admission, but no frank diarrhea. Patient also endorses progressive dyspnea and a cough associated with postnasal drainage, associated with wheezing. Pulmonary consulted 5/31 for hypoxia- uses O2 2L @ home but needing 4-5L here.   SIGNIFICANT EVENTS / STUDIES:  CTa chest 5/31> see report      ANTIBIOTICS: levaquin 5/31>>   SUBJECTIVE:  Feels better today though not active yet Comfortable on 3lpm    VITAL SIGNS: Temp:  [97.6 F (36.4 C)-98.2 F (36.8 C)] 98 F (36.7 C) (06/03 0433) Pulse Rate:  [59-76] 68 (06/03 0433) Resp:  [19-20] 19 (06/03 0433) BP: (118-127)/(61-67) 118/61 mmHg (06/03 0433) SpO2:  [93 %-99 %] 98 % (06/03 0753) Weight:  [116.5 kg (256 lb 13.4 oz)] 116.5 kg (256 lb 13.4 oz) (06/03 0416)  PHYSICAL EXAMINATION: General:  Pleasant. No distress semi-recumbent, feels better Neuro: Oriented and appropriate. Non-focal HEENT:  Dentures, speech clear Neck:  No stridor or JVD Cardiovascular:  IRR,  Lungs: Poor airflow, bibasilar crackles Abdomen:  Soft, non-tender, obese, BS present Musculoskeletal:  Moves all extrems, deconditioned Skin: no rash seen   Recent Labs Lab 05/09/13 0415  05/11/13 0450 05/11/13 2230 05/12/13 0455  NA 140  --  137  --  136  K 4.1  --  3.4*  --  4.1  CL 94*  --  89*  --  90*  CO2 41*  --  42*  --  38*  BUN 36*  --  32*  --  32*  CREATININE 0.76  --   0.76  --  0.79  GLUCOSE 199*  < > 190* 450* 193*  < > = values in this interval not displayed.  Recent Labs Lab 05/08/13 0458 05/11/13 0450 05/12/13 0455  HGB 13.4 14.1 13.5  HCT 41.3 43.6 43.4  WBC 8.8 12.9* 14.0*  PLT 177 186 228   No results found.      ASSESSMENT / PLAN:  1) Acute on chronic respiratory failure-  Probable aspiration pneumonia P- Recommend rapid taper off steroids. Continue levaquin. Mobilize for chest PT.  2) Obesity- hypoventilation > has home set up already plus 02 and f/u with Dr Maple Hudson planned  3) CHF/ pulmonary edema likely, associated with fluid overload and initial ProBNP 3000. Now being diuresed.  4) Past hx sarcoid- significant pulmonary activity is would be unusual at this age. Small nonspecific nodules on CT. ACE 68- this is an acute phase reactant and may be elevated due to pneumonia.  5) Past hx Rx Uterine Ca- don't expect change in this status. Continues Aromasin per Heme-Onc  6)  Asthma with bronchitis- She has preferred and can afford theophylline. Theo level 15.5, which can be enough to cause Nausea, aggravate AFib>  I don't see role for it longterm but will defer to Dr Maple Hudson since he is managing as outpt   Rec- 1) Theophylline 100 mg twice daily and consider complete taper off 2)  F/U  CT recommended by radiology in 3 months to recheck nodules and nodes. 3) Reflux precautions, with further w/u if appropriate 4) Get off steroids quickly 5)  Manage as aspiration pneumonia- levaquin ok. Flutter added. 6) FU with Dr. Maple Hudson. She will call and setup due to travel complications.  PCCM available as needed  Sandrea Hughs, MD Pulmonary and Critical Care Medicine [facility] Cell [phone] After 5:30 PM or weekends, call [phone]

## 2013-05-16 ENCOUNTER — Telehealth: Payer: Self-pay | Admitting: Internal Medicine

## 2013-05-16 NOTE — Telephone Encounter (Signed)
Spoke with patient-aware we do not have any openings on CY schedule for Monday 06-03-13 as requested; we were able to schedule patient on Thursday 06-06-13 at 11:15am for post hospital. Pt aware of appt date/time. Will sign off on message.

## 2013-05-16 NOTE — Telephone Encounter (Signed)
Follow up with Waymon Budge, MD In 2 weeks.  Contact information:  [address]   I spoke with pt and she is asking to get worked in on 06/03/13. Please advise Dr. Maple Hudson thanks Pending 11/04/13 Last OV 11/02/12

## 2013-05-18 ENCOUNTER — Inpatient Hospital Stay (HOSPITAL_COMMUNITY)
Admission: EM | Admit: 2013-05-18 | Discharge: 2013-05-22 | DRG: 377 | Disposition: A | Discharge status: Home Health | Payer: Medicare Other | Attending: Emergency Medicine | Admitting: Emergency Medicine

## 2013-05-18 ENCOUNTER — Encounter (HOSPITAL_COMMUNITY): Payer: Self-pay | Admitting: *Deleted

## 2013-05-18 DIAGNOSIS — R791 Abnormal coagulation profile: Secondary | ICD-10-CM | POA: Diagnosis present

## 2013-05-18 DIAGNOSIS — Z794 Long term (current) use of insulin: Secondary | ICD-10-CM

## 2013-05-18 DIAGNOSIS — J449 Chronic obstructive pulmonary disease, unspecified: Secondary | ICD-10-CM | POA: Diagnosis present

## 2013-05-18 DIAGNOSIS — G4733 Obstructive sleep apnea (adult) (pediatric): Secondary | ICD-10-CM | POA: Diagnosis present

## 2013-05-18 DIAGNOSIS — E119 Type 2 diabetes mellitus without complications: Secondary | ICD-10-CM | POA: Diagnosis present

## 2013-05-18 DIAGNOSIS — R609 Edema, unspecified: Secondary | ICD-10-CM

## 2013-05-18 DIAGNOSIS — J9610 Chronic respiratory failure, unspecified whether with hypoxia or hypercapnia: Secondary | ICD-10-CM | POA: Diagnosis present

## 2013-05-18 DIAGNOSIS — J441 Chronic obstructive pulmonary disease with (acute) exacerbation: Secondary | ICD-10-CM

## 2013-05-18 DIAGNOSIS — E662 Morbid (severe) obesity with alveolar hypoventilation: Secondary | ICD-10-CM

## 2013-05-18 DIAGNOSIS — J4489 Other specified chronic obstructive pulmonary disease: Secondary | ICD-10-CM | POA: Diagnosis present

## 2013-05-18 DIAGNOSIS — E1169 Type 2 diabetes mellitus with other specified complication: Secondary | ICD-10-CM | POA: Diagnosis present

## 2013-05-18 DIAGNOSIS — J069 Acute upper respiratory infection, unspecified: Secondary | ICD-10-CM

## 2013-05-18 DIAGNOSIS — E785 Hyperlipidemia, unspecified: Secondary | ICD-10-CM

## 2013-05-18 DIAGNOSIS — J9691 Respiratory failure, unspecified with hypoxia: Secondary | ICD-10-CM

## 2013-05-18 DIAGNOSIS — J99 Respiratory disorders in diseases classified elsewhere: Secondary | ICD-10-CM | POA: Diagnosis present

## 2013-05-18 DIAGNOSIS — Z96659 Presence of unspecified artificial knee joint: Secondary | ICD-10-CM

## 2013-05-18 DIAGNOSIS — T45515A Adverse effect of anticoagulants, initial encounter: Secondary | ICD-10-CM | POA: Diagnosis present

## 2013-05-18 DIAGNOSIS — Z79899 Other long term (current) drug therapy: Secondary | ICD-10-CM

## 2013-05-18 DIAGNOSIS — D72829 Elevated white blood cell count, unspecified: Secondary | ICD-10-CM | POA: Diagnosis present

## 2013-05-18 DIAGNOSIS — D649 Anemia, unspecified: Secondary | ICD-10-CM

## 2013-05-18 DIAGNOSIS — I4891 Unspecified atrial fibrillation: Secondary | ICD-10-CM | POA: Diagnosis present

## 2013-05-18 DIAGNOSIS — M109 Gout, unspecified: Secondary | ICD-10-CM | POA: Diagnosis present

## 2013-05-18 DIAGNOSIS — F411 Generalized anxiety disorder: Secondary | ICD-10-CM | POA: Diagnosis present

## 2013-05-18 DIAGNOSIS — D62 Acute posthemorrhagic anemia: Secondary | ICD-10-CM | POA: Diagnosis present

## 2013-05-18 DIAGNOSIS — J690 Pneumonitis due to inhalation of food and vomit: Secondary | ICD-10-CM

## 2013-05-18 DIAGNOSIS — Z7901 Long term (current) use of anticoagulants: Secondary | ICD-10-CM

## 2013-05-18 DIAGNOSIS — J189 Pneumonia, unspecified organism: Secondary | ICD-10-CM | POA: Diagnosis present

## 2013-05-18 DIAGNOSIS — D869 Sarcoidosis, unspecified: Secondary | ICD-10-CM | POA: Diagnosis present

## 2013-05-18 DIAGNOSIS — J40 Bronchitis, not specified as acute or chronic: Secondary | ICD-10-CM

## 2013-05-18 DIAGNOSIS — K5731 Diverticulosis of large intestine without perforation or abscess with bleeding: Principal | ICD-10-CM | POA: Diagnosis present

## 2013-05-18 DIAGNOSIS — K5900 Constipation, unspecified: Secondary | ICD-10-CM | POA: Diagnosis present

## 2013-05-18 DIAGNOSIS — N179 Acute kidney failure, unspecified: Secondary | ICD-10-CM

## 2013-05-18 DIAGNOSIS — I959 Hypotension, unspecified: Secondary | ICD-10-CM | POA: Diagnosis present

## 2013-05-18 DIAGNOSIS — K922 Gastrointestinal hemorrhage, unspecified: Secondary | ICD-10-CM | POA: Diagnosis present

## 2013-05-18 DIAGNOSIS — Z853 Personal history of malignant neoplasm of breast: Secondary | ICD-10-CM

## 2013-05-18 DIAGNOSIS — Z6841 Body Mass Index (BMI) 40.0 and over, adult: Secondary | ICD-10-CM

## 2013-05-18 DIAGNOSIS — R55 Syncope and collapse: Secondary | ICD-10-CM

## 2013-05-18 LAB — COMPREHENSIVE METABOLIC PANEL
ALT: 23 U/L (ref 0–35)
AST: 18 U/L (ref 0–37)
Albumin: 2.8 g/dL — ABNORMAL LOW (ref 3.5–5.2)
BUN: 18 mg/dL (ref 6–23)
CO2: 27 mEq/L (ref 19–32)
Chloride: 102 mEq/L (ref 96–112)
GFR calc non Af Amer: 82 mL/min — ABNORMAL LOW (ref >90)
Glucose, Bld: 166 mg/dL — ABNORMAL HIGH (ref 70–99)
Potassium: 4 mEq/L (ref 3.5–5.1)
Total Protein: 5.6 g/dL — ABNORMAL LOW (ref 6.0–8.3)

## 2013-05-18 LAB — GLUCOSE, CAPILLARY
Glucose-Capillary: 199 mg/dL — ABNORMAL HIGH (ref 70–99)
Glucose-Capillary: 222 mg/dL — ABNORMAL HIGH (ref 70–99)
Glucose-Capillary: 182 mg/dL — ABNORMAL HIGH (ref 70–99)

## 2013-05-18 LAB — CBC
HCT: 38.9 % (ref 36.0–46.0)
Hemoglobin: 12.9 g/dL (ref 12.0–15.0)
MCH: 27.8 pg (ref 26.0–34.0)
MCHC: 33 g/dL (ref 30.0–36.0)
MCV: 84.2 fL (ref 78.0–100.0)
Platelets: 172 10*3/uL (ref 150–400)
RDW: 17.1 % — ABNORMAL HIGH (ref 11.5–15.5)
WBC: 19.4 10*3/uL — ABNORMAL HIGH (ref 4.0–10.5)

## 2013-05-18 LAB — TYPE AND SCREEN: Antibody Screen: NEGATIVE

## 2013-05-18 LAB — PROTIME-INR: INR: 4.47 — ABNORMAL HIGH (ref 0.00–1.49)

## 2013-05-18 LAB — CORTISOL: Cortisol, Plasma: 32.3 ug/dL

## 2013-05-18 LAB — LACTIC ACID, PLASMA: Lactic Acid, Venous: 1.6 mmol/L (ref 0.5–2.2)

## 2013-05-18 MED ORDER — INSULIN ASPART 100 UNIT/ML ~~LOC~~ SOLN
0.0000 [IU] | SUBCUTANEOUS | Status: DC
  Administered 2013-05-18: 3 [IU] via SUBCUTANEOUS
  Administered 2013-05-18: 5 [IU] via SUBCUTANEOUS
  Administered 2013-05-19 (×2): 2 [IU] via SUBCUTANEOUS
  Administered 2013-05-20: 5 [IU] via SUBCUTANEOUS
  Administered 2013-05-20 – 2013-05-21 (×3): 2 [IU] via SUBCUTANEOUS
  Administered 2013-05-21 (×3): 5 [IU] via SUBCUTANEOUS
  Administered 2013-05-22 (×2): 3 [IU] via SUBCUTANEOUS
  Administered 2013-05-22: 2 [IU] via SUBCUTANEOUS
  Filled 2013-05-18: qty 5

## 2013-05-18 MED ORDER — IPRATROPIUM BROMIDE 0.02 % IN SOLN
0.5000 mg | Freq: Four times a day (QID) | RESPIRATORY_TRACT | Status: DC
Start: 2013-05-18 — End: 2013-05-18
  Administered 2013-05-18: 0.5 mg via RESPIRATORY_TRACT

## 2013-05-18 MED ORDER — VITAMIN K1 10 MG/ML IJ SOLN
10.0000 mg | INTRAVENOUS | Status: AC
  Administered 2013-05-18: 10 mg via INTRAVENOUS
  Filled 2013-05-18: qty 1

## 2013-05-18 MED ORDER — LORAZEPAM 0.5 MG PO TABS
0.5000 mg | ORAL_TABLET | Freq: Every evening | ORAL | Status: DC | PRN
  Administered 2013-05-19 – 2013-05-22 (×4): 0.5 mg via ORAL
  Filled 2013-05-18 (×4): qty 1

## 2013-05-18 MED ORDER — ANTIINHIBITOR COAGULANT CMPLX IV SOLR: 500.0000 [IU] | Freq: Once | INTRAVENOUS | Status: DC | PRN

## 2013-05-18 MED ORDER — ALBUTEROL SULFATE (5 MG/ML) 0.5% IN NEBU
2.5000 mg | INHALATION_SOLUTION | Freq: Four times a day (QID) | RESPIRATORY_TRACT | Status: DC
  Administered 2013-05-18: 2.5 mg via RESPIRATORY_TRACT
  Filled 2013-05-18: qty 1

## 2013-05-18 MED ORDER — ANTIINHIBITOR COAGULANT CMPLX IV SOLR: 500.0000 [IU] | INTRAVENOUS | Status: DC

## 2013-05-18 MED ORDER — VITAMIN K1 10 MG/ML IJ SOLN: 10.0000 mg | INTRAVENOUS | Status: DC

## 2013-05-18 MED ORDER — SODIUM CHLORIDE 0.9 % IV SOLN
INTRAVENOUS | Status: DC
  Administered 2013-05-18 – 2013-05-19 (×2): via INTRAVENOUS

## 2013-05-18 MED ORDER — SODIUM CHLORIDE 0.9 % IV SOLN: 250.0000 mL | INTRAVENOUS | Status: DC | PRN

## 2013-05-18 MED ORDER — PANTOPRAZOLE SODIUM 40 MG IV SOLR
40.0000 mg | Freq: Two times a day (BID) | INTRAVENOUS | Status: DC
  Administered 2013-05-18 – 2013-05-20 (×5): 40 mg via INTRAVENOUS
  Filled 2013-05-18 (×6): qty 40

## 2013-05-18 MED ORDER — ALBUTEROL SULFATE (5 MG/ML) 0.5% IN NEBU: 2.5000 mg | INHALATION_SOLUTION | RESPIRATORY_TRACT | Status: DC | PRN

## 2013-05-18 NOTE — H&P (Signed)
PULMONARY  / CRITICAL CARE MEDICINE  Name: [name] MRN: [id number] DOB: 10-26-1941    ADMISSION DATE:  05/18/2013 CONSULTATION DATE:  05/18/13  REFERRING MD :  EDP - Dr. Fredderick Phenix PRIMARY SERVICE: PCCM  CHIEF COMPLAINT:  GI Bleed  BRIEF PATIENT DESCRIPTION: 72 y/o F with PMH of DM, OSA on CPAP,  Sarcoid, Afib on Coumadin admitted to [facility] ICU for hypotension in the setting of acute GI Bleed.   SIGNIFICANT EVENTS / STUDIES:  5/23-6/03 - Admit with near syncope episode with N/V, possible PNA ....................................................................................... 6/07- Admit with rectal bleeding  LINES / TUBES:   CULTURES:   ANTIBIOTICS: levaquin 5/31 >> 6/7  HISTORY OF PRESENT ILLNESS: 72 y/o F with PMH of DM, OSA on CPAP,  Pulmonary Sarcoidosis, COPD / Asthma / Chronic Resp Fx on 2L O2 and theophylline, Afib on Coumadin with recent admit to [facility] with near syncopal episode with associated nausea & vomiting of unclear etiology, concern for aspiration PNA and decompensated CHF.  Cardiac work up at that time was negative.  Treated empirically for possible PNA based on CT chest w basilar infiltrates vs scar. Coumadin was adjusted at that time in the setting of abx.    Presented to [facility] ICU on 6/7 for with complaints of blood in the toilet with BM & ongoing through am.  ER evaluation demonstrated mild hypotension, nml H/H, afebrile, nml BMP.  Patient denies abd pain, shortness of breath, chest pain, n/v/d, sputum production.  Denies pain with BM. Endorses weakness. No further syncope.   PAST MEDICAL HISTORY :  Past Medical History  Diagnosis Date  . Diabetes mellitus   . Asthma   . Bronchitis   . Hernia   . Anemia   . Uterine cancer     s/p hysterectomy  . Arthritis   . Hernia   . Morbid obesity   . Sarcoid   . OSA (obstructive sleep apnea)     Uses nasal CPAP Q HS  . Atrial fibrillation   . Breast cancer, stage 1 10/17/2011    s/p lumpectomy and  XRT  . Cough   . Wheezing   . Chills   . Constipation   . Bruises easily   . Gout attack     ankle, then wrist and hands  . Hyperlipidemia    Past Surgical History  Procedure Laterality Date  . Total knee arthroplasty  2001  . Abdominal hysterectomy  2006  . Resection of uterine cancer    . Left lower leg fx--casted    . Breast lumpectomy Right    Prior to Admission medications   Medication Sig Start Date End Date Taking? Authorizing Provider  acetaminophen (TYLENOL) 500 MG tablet Take 1,000 mg by mouth every 6 (six) hours as needed. pain   Yes Historical Provider, MD  alendronate (FOSAMAX) 70 MG tablet Take 70 mg by mouth every 7 (seven) days. Take with a full glass of water on an empty stomach. Do not sit for 30 minutes.  Takes on saturday 04/05/13  Yes Victorino December, MD  allopurinol (ZYLOPRIM) 100 MG tablet Take 200 mg by mouth daily.  11/04/12  Yes Historical Provider, MD  chlorpheniramine-HYDROcodone (TUSSIONEX) 10-8 MG/5ML LQCR Take 5 mLs by mouth every 12 (twelve) hours as needed. 05/14/13  Yes Joseph Art, DO  cholecalciferol (VITAMIN D) 1000 UNITS tablet Take 1 tablet (1,000 Units total) by mouth daily. 08/08/12 08/08/13 Yes Victorino December, MD  colchicine 0.6 MG tablet Take 1  tablet (0.6 mg total) by mouth 2 (two) times daily. 11/22/12  Yes [name], MD  diltiazem (TIAZAC) 360 MG 24 hr capsule Take 1 tablet by mouth daily.  06/06/11  Yes Historical Provider, MD  exemestane (AROMASIN) 25 MG tablet Take 1 tablet (25 mg total) by mouth daily after breakfast. 03/08/13  Yes Victorino December, MD  furosemide (LASIX) 40 MG tablet Take 1 tablet (40 mg total) by mouth daily. 05/14/13  Yes Joseph Art, DO  guaiFENesin (MUCINEX) 600 MG 12 hr tablet Take 1 tablet (600 mg total) by mouth 2 (two) times daily. 05/14/13  Yes Jessica U Vann, DO  HUMULIN N 100 UNIT/ML injection Inject 5 Units into the skin as needed (5 units if blood sugar is greater than 150).  02/05/13  Yes Historical  Provider, MD  HYDROcodone-acetaminophen (NORCO/VICODIN) 5-325 MG per tablet Take 1 tablet by mouth every 6 (six) hours as needed. For pain. 05/14/13  Yes Jessica U Vann, DO  insulin glargine (LANTUS) 100 UNIT/ML injection Inject 0.28 mLs (28 Units total) into the skin at bedtime. 05/14/13  Yes Jessica U Vann, DO  ipratropium (ATROVENT) 0.02 % nebulizer solution Take 2.5 mLs (0.5 mg total) by nebulization every 6 (six) hours. 11/22/12  Yes [name], MD  levalbuterol (XOPENEX) 0.63 MG/3ML nebulizer solution Take 3 mLs (0.63 mg total) by nebulization every 6 (six) hours. 11/22/12  Yes [name], MD  levofloxacin (LEVAQUIN) 750 MG tablet Take 1 tablet (750 mg total) by mouth daily. 05/14/13  Yes Jessica U Vann, DO  LORazepam (ATIVAN) 0.5 MG tablet Take 0.5 mg by mouth at bedtime as needed. For sleep.   Yes Historical Provider, MD  meclizine (ANTIVERT) 25 MG tablet Take 6.25-12.5 mg by mouth 3 (three) times daily as needed. For dizziness.   Yes Historical Provider, MD  metFORMIN (GLUCOPHAGE) 500 MG tablet Take 500 mg by mouth 2 (two) times daily with a meal. If cbg is greater than 120 pt takes medication   Yes Historical Provider, MD  metoprolol tartrate (LOPRESSOR) 25 MG tablet Take 25 mg by mouth 2 (two) times daily.  05/13/11  Yes Historical Provider, MD  Multiple Vitamin (MULTIVITAMIN WITH MINERALS) TABS Take 1 tablet by mouth daily.   Yes Historical Provider, MD  potassium chloride SA (K-DUR,KLOR-CON) 20 MEQ tablet Take 20 mEq by mouth 2 (two) times daily.  02/11/13  Yes Historical Provider, MD  predniSONE (DELTASONE) 50 MG tablet 40 mg x 3 days, 30 mg x 3 days, 20 mg x 3 days, 10 mg x 3 days 05/14/13  Yes Joseph Art, DO  promethazine (PHENERGAN) 25 MG tablet Take 25 mg by mouth every 6 (six) hours as needed. For nausea.   Yes Historical Provider, MD  simvastatin (ZOCOR) 40 MG tablet Take 40 mg by mouth at bedtime.   Yes Historical Provider, MD  theophylline (THEODUR) 200 MG 12 hr tablet Take  0.5 tablets (100 mg total) by mouth 2 (two) times daily. 05/14/13  Yes Joseph Art, DO  warfarin (COUMADIN) 6 MG tablet Take 1 tablet (6 mg total) by mouth daily. 05/14/13  Yes Joseph Art, DO  Calcium Carbonate-Vitamin D (CALCIUM 600 + D PO) Take 1 tablet by mouth 2 (two) times daily.     Historical Provider, MD   Allergies  Allergen Reactions  . Aspirin     Avoids due to being on blood thinners  . Contrast Media (Iodinated Diagnostic Agents) Nausea And Vomiting  . Ibuprofen  Avoids due to being on blood thinners  . Pravachol Other (See Comments)    Muscle pain  . Pravastatin Sodium     FAMILY HISTORY:  Family History  Problem Relation Age of Onset  . Diabetes Mother   . Cancer Father     colon  . Diabetes Brother   . Cancer Sister   . Diabetes Sister    SOCIAL HISTORY:  reports that she has never smoked. She has never used smokeless tobacco. She reports that she does not drink alcohol or use illicit drugs.  REVIEW OF SYSTEMS:  See HPI for pertinent positives.  All systems reviewed and otherwise negative.   SUBJECTIVE:   VITAL SIGNS: Temp:  [97.7 F (36.5 C)] 97.7 F (36.5 C) (06/07 1357) Pulse Rate:  [80-87] 80 (06/07 1408) Resp:  [18-20] 18 (06/07 1408) BP: (85-106)/(53-58) 94/53 mmHg (06/07 1411) SpO2:  [94 %-97 %] 95 % (06/07 1408)  HEMODYNAMICS:    VENTILATOR SETTINGS:    INTAKE / OUTPUT: Intake/Output   None     PHYSICAL EXAMINATION: General:  Obese woman, NAD Neuro:  Awake, alert, non-focal HEENT:  OP dry, no lesions, PERRL Cardiovascular:  Regular, distant, no M Lungs:  Clear B, decreased at bases.  Abdomen:  Obese, soft, NT/ND Musculoskeletal:  No edema, no deformities Skin:  No rash  LABS:  Recent Labs Lab 05/11/13 2230  05/12/13 0455 05/13/13 0410 05/14/13 0454 05/18/13 1226  HGB  --   --  13.5  --   --  12.9  WBC  --   --  14.0*  --   --  19.4*  PLT  --   --  228  --   --  194  NA  --   --  136  --   --  137  K  --   --   4.1  --   --  4.0  CL  --   --  90*  --   --  102  CO2  --   --  38*  --   --  27  GLUCOSE 450*  --  193*  --   --  166*  BUN  --   --  32*  --   --  18  CREATININE  --   --  0.79  --   --  0.76  CALCIUM  --   --  9.6  --   --  8.3*  AST  --   --   --   --   --  18  ALT  --   --   --   --   --  23  ALKPHOS  --   --   --   --   --  62  BILITOT  --   --   --   --   --  0.6  PROT  --   --   --   --   --  5.6*  ALBUMIN  --   --   --   --   --  2.8*  INR  --   < > 1.71* 1.79* 1.68* 4.47*  < > = values in this interval not displayed.  Recent Labs Lab 05/13/13 1130 05/13/13 1639 05/13/13 2038 05/14/13 0719 05/14/13 1116  GLUCAP 243* 290* 374* 165* 170*    CXR:   ASSESSMENT / PLAN:  GASTROINTESTINAL A:   Rectal Bleeding - acute onset am of 6/7. In setting coagulopathy on coumadin. Hgb stable  compareed with last admit, hemodynamically stable. Note hx of breast CA.  Hx Hernia Morbid Obesity Constipation   P:   -assess FOB -GI Consult - appreciate input -Vit K and FFP ordered -hold anti-coagulation / place SCD's -CBC Q6  -PPI Q12 -type and screen   PULMONARY A: Chronic Respiratory Failure - on baseline O2 2L Sarcoidosis  Asthma - on baseline theophylline, hopeful to wean off with Dr. Maple Hudson due to cost OSA - on baseline CPAP  P:   -continue O2 -HOLD theophylline,  Level on 5/31 15.5 -auto-set CPAP QHS -PRN BD's  CARDIOVASCULAR A:  Mild Hypotension - in setting of rectal bleeding, hgb stable Atrial Fibrillation - on coumadin  HLD   P:  -assess lactic acid, cortisol (recently on prednisone) -consider hydrocort if evidence adrenal suppression -volume replacement -hold lasix, metoprolol, diltiazem (on 360 ER at home) -hold coumadin - follow INR  HEMATOLOGIC A:   GI Bleed  P:  -follow H/H, serial CBC -vitamin K now   RENAL A:   P:   -monitor renal fxn   INFECTIOUS A:   No acute infectious process. Recently treated for possible LL PNA's (8 days  levaquin)  P:   -monitor fever curve / leukocytosis  ENDOCRINE A:   Hyperglycemia / DM   P:   -SSI   NEUROLOGIC A:   Anxiety   P:   -hold ativan with hypotension   I have personally obtained a history, examined the patient, evaluated laboratory and imaging results, formulated the assessment and plan and placed orders.   Levy Pupa, MD, PhD 05/18/2013, 3:42 PM [facility] [phone] or if no answer [phone]

## 2013-05-18 NOTE — ED Notes (Signed)
Bolus of 500 cc given for bp

## 2013-05-18 NOTE — Progress Notes (Signed)
eLink Physician-Brief Progress Note Patient Name: [name] DOB: 01/12/1941 MRN: [id number]  Date of Service  05/18/2013   HPI/Events of Note   Insomnia, ativan at home for sleep  eICU Interventions  Add home dose ativan      MCQUAID, DOUGLAS 05/18/2013, 11:51 PM

## 2013-05-18 NOTE — ED Notes (Signed)
Per ems: pt from home, reports having BM 1 hour ago, bright red blood noted. No hx of GI bleed. On coumadin. Was discharged from hospital last Tuesday for PNA. Denies pain. NPO since last night. bp 127/78, pulse 77 irregular (afib) respirations 18, saO2 97% ra. CBG 115

## 2013-05-18 NOTE — ED Notes (Signed)
ZOX:[id number]<AV> Expected date:05/18/13<BR> Expected time:11:45 AM<BR> Means of arrival:Ambulance<BR> Comments:<BR> Rectal Bleed

## 2013-05-18 NOTE — ED Provider Notes (Addendum)
History     CSN: [id number]  Arrival date & time 05/18/13  1145   First MD Initiated Contact with Patient 05/18/13 1216      Chief Complaint  Patient presents with  . GI Bleeding    (Consider location/radiation/quality/duration/timing/severity/associated sxs/prior treatment) HPI Comments: Patient history of atrial fibrillation on Coumadin presents with rectal bleeding. She states that this morning she noticed some blood in in the toilet when she was having a bowel movement and has had ongoing bleeding throughout the morning. She was recently admitted for pneumonia to the hospital and was discharged 4 days ago. At that point and increased her Coumadin prior to discharge. She denies any history of GI problems in the past. She denies a history of rectal bleeding. She denies abdominal pain. She denies any chest pain shortness of breath or increased fatigue.   Past Medical History  Diagnosis Date  . Diabetes mellitus   . Asthma   . Bronchitis   . Hernia   . Anemia   . Uterine cancer     s/p hysterectomy  . Arthritis   . Hernia   . Morbid obesity   . Sarcoid   . OSA (obstructive sleep apnea)     Uses nasal CPAP Q HS  . Atrial fibrillation   . Breast cancer, stage 1 10/17/2011    s/p lumpectomy and XRT  . Cough   . Wheezing   . Chills   . Constipation   . Bruises easily   . Gout attack     ankle, then wrist and hands  . Hyperlipidemia     Past Surgical History  Procedure Laterality Date  . Total knee arthroplasty  2001  . Abdominal hysterectomy  2006  . Resection of uterine cancer    . Left lower leg fx--casted    . Breast lumpectomy Right     Family History  Problem Relation Age of Onset  . Diabetes Mother   . Cancer Father     colon  . Diabetes Brother   . Cancer Sister   . Diabetes Sister     History  Substance Use Topics  . Smoking status: Never Smoker   . Smokeless tobacco: Never Used  . Alcohol Use: No    OB History   Grav Para Term Preterm  Abortions TAB SAB Ect Mult Living                  Review of Systems  Constitutional: Negative for fever, chills, diaphoresis and fatigue.  HENT: Negative for congestion, rhinorrhea and sneezing.   Eyes: Negative.   Respiratory: Negative for cough, chest tightness and shortness of breath.   Cardiovascular: Negative for chest pain and leg swelling.  Gastrointestinal: Positive for blood in stool. Negative for nausea, vomiting, abdominal pain and diarrhea.  Genitourinary: Negative for frequency, hematuria, flank pain and difficulty urinating.  Musculoskeletal: Negative for back pain and arthralgias.  Skin: Negative for rash.  Neurological: Negative for dizziness, speech difficulty, weakness, numbness and headaches.    Allergies  Aspirin; Contrast media; Ibuprofen; Pravachol; and Pravastatin sodium  Home Medications   Current Outpatient Rx  Name  Route  Sig  Dispense  Refill  . acetaminophen (TYLENOL) 500 MG tablet   Oral   Take 1,000 mg by mouth every 6 (six) hours as needed. pain         . alendronate (FOSAMAX) 70 MG tablet   Oral   Take 70 mg by mouth every 7 (seven) days. Take with  a full glass of water on an empty stomach. Do not sit for 30 minutes.  Takes on saturday         . allopurinol (ZYLOPRIM) 100 MG tablet   Oral   Take 200 mg by mouth daily.          . chlorpheniramine-HYDROcodone (TUSSIONEX) 10-8 MG/5ML LQCR   Oral   Take 5 mLs by mouth every 12 (twelve) hours as needed.   115 mL   0   . cholecalciferol (VITAMIN D) 1000 UNITS tablet   Oral   Take 1 tablet (1,000 Units total) by mouth daily.   30 tablet   12   . colchicine 0.6 MG tablet   Oral   Take 1 tablet (0.6 mg total) by mouth 2 (two) times daily.   60 tablet   0   . diltiazem (TIAZAC) 360 MG 24 hr capsule   Oral   Take 1 tablet by mouth daily.          Marland Kitchen exemestane (AROMASIN) 25 MG tablet   Oral   Take 1 tablet (25 mg total) by mouth daily after breakfast.   30 tablet   12   .  furosemide (LASIX) 40 MG tablet   Oral   Take 1 tablet (40 mg total) by mouth daily.   30 tablet   0   . guaiFENesin (MUCINEX) 600 MG 12 hr tablet   Oral   Take 1 tablet (600 mg total) by mouth 2 (two) times daily.   30 tablet   0   . HUMULIN N 100 UNIT/ML injection   Subcutaneous   Inject 5 Units into the skin as needed (5 units if blood sugar is greater than 150).          Marland Kitchen HYDROcodone-acetaminophen (NORCO/VICODIN) 5-325 MG per tablet   Oral   Take 1 tablet by mouth every 6 (six) hours as needed. For pain.   30 tablet   0   . insulin glargine (LANTUS) 100 UNIT/ML injection   Subcutaneous   Inject 0.28 mLs (28 Units total) into the skin at bedtime.   10 mL   12   . ipratropium (ATROVENT) 0.02 % nebulizer solution   Nebulization   Take 2.5 mLs (0.5 mg total) by nebulization every 6 (six) hours.   75 mL   0   . levalbuterol (XOPENEX) 0.63 MG/3ML nebulizer solution   Nebulization   Take 3 mLs (0.63 mg total) by nebulization every 6 (six) hours.   3 mL   0   . levofloxacin (LEVAQUIN) 750 MG tablet   Oral   Take 1 tablet (750 mg total) by mouth daily.   5 tablet   0   . LORazepam (ATIVAN) 0.5 MG tablet   Oral   Take 0.5 mg by mouth at bedtime as needed. For sleep.         . meclizine (ANTIVERT) 25 MG tablet   Oral   Take 6.25-12.5 mg by mouth 3 (three) times daily as needed. For dizziness.         . metFORMIN (GLUCOPHAGE) 500 MG tablet   Oral   Take 500 mg by mouth 2 (two) times daily with a meal. If cbg is greater than 120 pt takes medication         . metoprolol tartrate (LOPRESSOR) 25 MG tablet   Oral   Take 25 mg by mouth 2 (two) times daily.          . Multiple  Vitamin (MULTIVITAMIN WITH MINERALS) TABS   Oral   Take 1 tablet by mouth daily.         . potassium chloride SA (K-DUR,KLOR-CON) 20 MEQ tablet   Oral   Take 20 mEq by mouth 2 (two) times daily.          . predniSONE (DELTASONE) 50 MG tablet      40 mg x 3 days, 30 mg x 3  days, 20 mg x 3 days, 10 mg x 3 days   30 tablet   0   . promethazine (PHENERGAN) 25 MG tablet   Oral   Take 25 mg by mouth every 6 (six) hours as needed. For nausea.         . simvastatin (ZOCOR) 40 MG tablet   Oral   Take 40 mg by mouth at bedtime.         . theophylline (THEODUR) 200 MG 12 hr tablet   Oral   Take 0.5 tablets (100 mg total) by mouth 2 (two) times daily.   30 tablet   0   . warfarin (COUMADIN) 6 MG tablet   Oral   Take 1 tablet (6 mg total) by mouth daily.   15 tablet   0   . Calcium Carbonate-Vitamin D (CALCIUM 600 + D PO)   Oral   Take 1 tablet by mouth 2 (two) times daily.            BP 94/53  Pulse 80  Temp(Src) 97.7 F (36.5 C) (Oral)  Resp 18  SpO2 95%  Physical Exam  Constitutional: She is oriented to person, place, and time. She appears well-developed and well-nourished.  HENT:  Head: Normocephalic and atraumatic.  Slightly pale conjunctiva  Eyes: Pupils are equal, round, and reactive to light.  Neck: Normal range of motion. Neck supple.  Cardiovascular: Normal rate, regular rhythm and normal heart sounds.   Pulmonary/Chest: Effort normal and breath sounds normal. No respiratory distress. She has no wheezes. She has no rales. She exhibits no tenderness.  Abdominal: Soft. Bowel sounds are normal. There is no tenderness. There is no rebound and no guarding.  Genitourinary:  Patient has a large amount of bright red blood with clots in her diaper and on the bed.  Musculoskeletal: Normal range of motion. She exhibits no edema.  Lymphadenopathy:    She has no cervical adenopathy.  Neurological: She is alert and oriented to person, place, and time.  Skin: Skin is warm and dry. No rash noted.  Psychiatric: She has a normal mood and affect.    ED Course  Procedures (including critical care time)  Results for orders placed during the hospital encounter of 05/18/13  PROTIME-INR      Result Value Range   Prothrombin Time 39.7 (*) 11.6  - 15.2 seconds   INR 4.47 (*) 0.00 - 1.49  CBC      Result Value Range   WBC 19.4 (*) 4.0 - 10.5 K/uL   RBC 4.63  3.87 - 5.11 MIL/uL   Hemoglobin 12.9  12.0 - 15.0 g/dL   HCT 62.9  52.8 - 41.3 %   MCV 84.0  78.0 - 100.0 fL   MCH 27.9  26.0 - 34.0 pg   MCHC 33.2  30.0 - 36.0 g/dL   RDW 24.4 (*) 01.0 - 27.2 %   Platelets 194  150 - 400 K/uL  COMPREHENSIVE METABOLIC PANEL      Result Value Range   Sodium 137  135 -  145 mEq/L   Potassium 4.0  3.5 - 5.1 mEq/L   Chloride 102  96 - 112 mEq/L   CO2 27  19 - 32 mEq/L   Glucose, Bld 166 (*) 70 - 99 mg/dL   BUN 18  6 - 23 mg/dL   Creatinine, Ser 2.13  0.50 - 1.10 mg/dL   Calcium 8.3 (*) 8.4 - 10.5 mg/dL   Total Protein 5.6 (*) 6.0 - 8.3 g/dL   Albumin 2.8 (*) 3.5 - 5.2 g/dL   AST 18  0 - 37 U/L   ALT 23  0 - 35 U/L   Alkaline Phosphatase 62  39 - 117 U/L   Total Bilirubin 0.6  0.3 - 1.2 mg/dL   GFR calc non Af Amer 82 (*) >90 mL/min   GFR calc Af Amer >90  >90 mL/min  TYPE AND SCREEN      Result Value Range   ABO/RH(D) O NEG     Antibody Screen NEG     Sample Expiration 05/21/2013    PREPARE FRESH FROZEN PLASMA      Result Value Range   Unit Number [id number]     Blood Component Type THAWED PLASMA     Unit division 00     Status of Unit ALLOCATED     Transfusion Status OK TO TRANSFUSE     Unit Number [id number]     Blood Component Type THAWED PLASMA     Unit division 00     Status of Unit ALLOCATED     Transfusion Status OK TO TRANSFUSE       1. GI bleed   2. Coumadin toxicity, initial encounter       MDM  12:45 Pt with large amount of BRBPR.  Awaiting Hgb, INR, contacted Dr Arlyce Dice who will come and see the pt  Dr Arlyce Dice notified up that pt has seen Dr Randa Evens in the past even though pt denies having a GI physician.  Awaiting consult from [facility] GI.  Pt had another large bloody bowel movement.  Will start oral anticoagulation reversal protocol.  Discussed the patient with Dr. Dulce Sellar if you did not feel that  there was anything he he could do at this point other than reverse the Coumadin and supportive care measures. He will consult on the patient in the hospital.  Patient did become hypotensive in the ED with a blood pressure of 85 systolic. She's mentating normally. She was given IV fluid bolus. I will recheck a hemoglobin. I also discussed the patient with Dr. Solon Augusta with critical care medicine who will see the patient.  CRITICAL CARE Performed by: Vernica Wachtel Total critical care time: 60 Critical care time was exclusive of separately billable procedures and treating other patients. Critical care was necessary to treat or prevent imminent or life-threatening deterioration. Critical care was time spent personally by me on the following activities: development of treatment plan with patient and/or surrogate as well as nursing, discussions with consultants, evaluation of patient's response to treatment, examination of patient, obtaining history from patient or surrogate, ordering and performing treatments and interventions, ordering and review of laboratory studies, ordering and review of radiographic studies, pulse oximetry and re-evaluation of patient's condition.  Pt with large amount of GI bleeding, on coumadin, hypotensive.  Requiring close monitoring   Rolan Bucco, MD 05/18/13 1420  Rolan Bucco, MD 05/18/13 1442

## 2013-05-19 LAB — CBC
HCT: 25.6 % — ABNORMAL LOW (ref 36.0–46.0)
HCT: 26.5 % — ABNORMAL LOW (ref 36.0–46.0)
HCT: 24.7 % — ABNORMAL LOW (ref 36.0–46.0)
Hemoglobin: 8.8 g/dL — ABNORMAL LOW (ref 12.0–15.0)
MCH: 27.9 pg (ref 26.0–34.0)
MCHC: 33.2 g/dL (ref 30.0–36.0)
MCV: 84.3 fL (ref 78.0–100.0)
Platelets: 147 10*3/uL — ABNORMAL LOW (ref 150–400)
Platelets: 107 10*3/uL — ABNORMAL LOW (ref 150–400)
RBC: 3.05 MIL/uL — ABNORMAL LOW (ref 3.87–5.11)
RBC: 2.93 MIL/uL — ABNORMAL LOW (ref 3.87–5.11)
RDW: 17.1 % — ABNORMAL HIGH (ref 11.5–15.5)
WBC: 16.8 10*3/uL — ABNORMAL HIGH (ref 4.0–10.5)
WBC: 10 10*3/uL (ref 4.0–10.5)

## 2013-05-19 LAB — URINE MICROSCOPIC-ADD ON

## 2013-05-19 LAB — GLUCOSE, CAPILLARY
Glucose-Capillary: 106 mg/dL — ABNORMAL HIGH (ref 70–99)
Glucose-Capillary: 130 mg/dL — ABNORMAL HIGH (ref 70–99)
Glucose-Capillary: 135 mg/dL — ABNORMAL HIGH (ref 70–99)

## 2013-05-19 LAB — URINALYSIS, ROUTINE W REFLEX MICROSCOPIC
Bilirubin Urine: NEGATIVE
Hgb urine dipstick: NEGATIVE
Nitrite: NEGATIVE
Specific Gravity, Urine: 1.009 (ref 1.005–1.030)
pH: 7.5 (ref 5.0–8.0)

## 2013-05-19 LAB — PREPARE FRESH FROZEN PLASMA: Unit division: 0

## 2013-05-19 LAB — BASIC METABOLIC PANEL
BUN: 20 mg/dL (ref 6–23)
Calcium: 7.7 mg/dL — ABNORMAL LOW (ref 8.4–10.5)
Creatinine, Ser: 0.88 mg/dL (ref 0.50–1.10)
GFR calc non Af Amer: 64 mL/min — ABNORMAL LOW (ref >90)
Glucose, Bld: 74 mg/dL (ref 70–99)

## 2013-05-19 MED ORDER — DILTIAZEM HCL 30 MG PO TABS
30.0000 mg | ORAL_TABLET | Freq: Four times a day (QID) | ORAL | Status: DC
  Administered 2013-05-19 – 2013-05-21 (×7): 30 mg via ORAL
  Filled 2013-05-19 (×11): qty 1

## 2013-05-19 MED ORDER — ACETAMINOPHEN 325 MG PO TABS
ORAL_TABLET | ORAL | Status: AC
  Administered 2013-05-19: 325 mg
  Filled 2013-05-19: qty 2

## 2013-05-19 MED ORDER — METOPROLOL TARTRATE 25 MG PO TABS
25.0000 mg | ORAL_TABLET | Freq: Two times a day (BID) | ORAL | Status: DC
  Administered 2013-05-19 – 2013-05-22 (×6): 25 mg via ORAL
  Filled 2013-05-19 (×7): qty 1

## 2013-05-19 MED ORDER — ACETAMINOPHEN 160 MG/5ML PO SOLN
650.0000 mg | Freq: Four times a day (QID) | ORAL | Status: DC | PRN
  Administered 2013-05-19 – 2013-05-22 (×5): 650 mg
  Filled 2013-05-19 (×5): qty 20.3

## 2013-05-19 NOTE — Progress Notes (Signed)
PULMONARY  / CRITICAL CARE MEDICINE  Name: [name] MRN: [id number] DOB: 11-27-1941    ADMISSION DATE:  05/18/2013 CONSULTATION DATE:  05/18/13  REFERRING MD :  EDP - Dr. Fredderick Phenix PRIMARY SERVICE: PCCM  CHIEF COMPLAINT:  GI Bleed  BRIEF PATIENT DESCRIPTION: 72 y/o F with PMH of DM, OSA on CPAP,  Sarcoid, Afib on Coumadin admitted to [facility] ICU for hypotension in the setting of acute GI Bleed.   SIGNIFICANT EVENTS / STUDIES:  5/23-6/03 - Admit with near syncope episode with N/V, possible PNA ....................................................................................... 6/07- Admit with rectal bleeding  LINES / TUBES:   CULTURES:   ANTIBIOTICS: levaquin 5/31 >> 6/7  HISTORY OF PRESENT ILLNESS: 72 y/o F with PMH of DM, OSA on CPAP,  Pulmonary Sarcoidosis, COPD / Asthma / Chronic Resp Fx on 2L O2 and theophylline, Afib on Coumadin with recent admit to [facility] with near syncopal episode with associated nausea & vomiting of unclear etiology, concern for aspiration PNA and decompensated CHF.  Cardiac work up at that time was negative.  Treated empirically for possible PNA based on CT chest w basilar infiltrates vs scar. Coumadin was adjusted at that time in the setting of abx.    Presented to [facility] ICU on 6/7 for with complaints of blood in the toilet with BM & ongoing through am.  ER evaluation demonstrated mild hypotension, nml H/H, afebrile, nml BMP.  Patient denies abd pain, shortness of breath, chest pain, n/v/d, sputum production.  Denies pain with BM. Endorses weakness. No further syncope.   SUBJECTIVE: last BM 6/7 afternoon No abd pain  VITAL SIGNS: Temp:  [97.1 F (36.2 C)-98.2 F (36.8 C)] 97.6 F (36.4 C) (06/08 0735) Pulse Rate:  [58-109] 99 (06/08 0400) Resp:  [13-26] 18 (06/08 0400) BP: (82-129)/(40-78) 94/57 mmHg (06/08 0400) SpO2:  [94 %-98 %] 96 % (06/08 0400) Weight:  [113.9 kg (251 lb 1.7 oz)-115 kg (253 lb 8.5 oz)] 113.9 kg (251 lb 1.7 oz)  (06/08 0500)  HEMODYNAMICS:    VENTILATOR SETTINGS:    INTAKE / OUTPUT: Intake/Output     06/07 0701 - 06/08 0700 06/08 0701 - 06/09 0700   I.V. (mL/kg) 627.5 (5.5)    Blood 964.5    IV Piggyback 50    Total Intake(mL/kg) 1642 (14.4)    Urine (mL/kg/hr) 200    Total Output 200     Net +1442          Urine Occurrence 3 x    Stool Occurrence 2 x      PHYSICAL EXAMINATION: General:  Obese woman, NAD Neuro:  Awake, alert, non-focal HEENT:  OP dry, no lesions, PERRL Cardiovascular:  Regular, distant, no M Lungs:  Clear B, decreased at bases.  Abdomen:  Obese, soft, NT/ND Musculoskeletal:  No edema, no deformities Skin:  No rash  LABS:  Recent Labs Lab 05/18/13 1226  05/18/13 1602 05/18/13 2058 05/19/13 0325 05/19/13 0505  HGB 12.9  < > 11.7* 9.3* 8.6* 8.8*  WBC 19.4*  --   --  20.1* 16.8* 15.6*  PLT 194  --   --  172 147* 135*  NA 137  --   --   --   --  140  K 4.0  --   --   --   --  4.0  CL 102  --   --   --   --  104  CO2 27  --   --   --   --  33*  GLUCOSE 166*  --   --   --   --  74  BUN 18  --   --   --   --  20  CREATININE 0.76  --   --   --   --  0.88  CALCIUM 8.3*  --   --   --   --  7.7*  MG  --   --   --   --   --  1.7  PHOS  --   --   --   --   --  2.3  AST 18  --   --   --   --   --   ALT 23  --   --   --   --   --   ALKPHOS 62  --   --   --   --   --   BILITOT 0.6  --   --   --   --   --   PROT 5.6*  --   --   --   --   --   ALBUMIN 2.8*  --   --   --   --   --   INR 4.47*  --  2.11*  --   --  1.31  LATICACIDVEN  --   --  1.6  --   --   --   < > = values in this interval not displayed.  Recent Labs Lab 05/18/13 1613 05/18/13 2100 05/18/13 2346 05/19/13 0340 05/19/13 0729  GLUCAP 222* 182* 135* 77 73    CXR:   ASSESSMENT / PLAN:  GASTROINTESTINAL A:   Rectal Bleeding - acute onset am of 6/7. In setting coagulopathy on coumadin. Hgb stable compareed with last admit, hemodynamically stable. Note hx of breast CA.  Hx  Hernia Morbid Obesity Constipation   P:   -assess FOB -GI Consult - Dr Dulce Sellar called -Vit K and FFP given  -hold anti-coagulation / place SCD's -CBC Q8 -PPI Q12 -type and screen   PULMONARY A: Chronic Respiratory Failure - on baseline O2 2L Sarcoidosis  Asthma - on baseline theophylline, hopeful to wean off with Dr. Maple Hudson due to cost OSA - on baseline CPAP  P:   -continue O2 -HOLD theophylline,  Level on 5/31 15.5 -auto-set CPAP QHS -PRN BD's  CARDIOVASCULAR A:  Mild Hypotension - in setting of rectal bleeding, hgb stable Atrial Fibrillation - on coumadin  HLD   P:  -hold lasix, metoprolol, diltiazem (on 360 ER at home) -hold coumadin - follow INR  HEMATOLOGIC A:   Coagulopathy - due to levaquin  P:  -follow H/H, serial CBC    RENAL A:   P:   -monitor renal fxn   INFECTIOUS A:   No acute infectious process. Recently treated for possible LL PNA's (8 days levaquin)  P:   -monitor fever curve / leukocytosis  ENDOCRINE A:   Hyperglycemia / DM   P:   -SSI   NEUROLOGIC A:   Anxiety   P:   -hold ativan with hypotension   I have personally obtained a history, examined the patient, evaluated laboratory and imaging results, formulated the assessment and plan and placed orders.  [name] MD. Tonny Bollman. [facility] Pager [phone] If no response call [phone]   05/19/2013, 9:04 AM

## 2013-05-19 NOTE — Consult Note (Signed)
[facility] Gastroenterology Consultation Note  Referring Provider: Dr. Levy Pupa (PCCM) Primary Care Physician:  [name], MD Primary Gastroenterologist:  Dr. Carman Ching  Reason for Consultation:  hematochezia  HPI: [name] is a 72 y.o. female admitted for, and whom we've been asked to see for, hematochezia.  Patient has atrial fibrillation and is on chronic warfarin.  Was in static state of health until yesterday, when she had acute onset of bowel urgency followed by bright red hematochezia.  Found to have INR 4.5.  She has no abdominal pain, change in bowel habits, hematemesis.  She has never previously had blood in stool.  Has had several prior colonoscopies, last April 2013 by Dr. Randa Evens showing sub-cm adenomatous polyp in descending colon as well as pancolonic diverticulosis.  Was admitted to hospital and was managed supportively, including reversal of her coagulopathy.  She has not had any bleeding today.   Past Medical History  Diagnosis Date  . Diabetes mellitus   . Asthma   . Bronchitis   . Hernia   . Anemia   . Uterine cancer     s/p hysterectomy  . Arthritis   . Hernia   . Morbid obesity   . Sarcoid   . OSA (obstructive sleep apnea)     Uses nasal CPAP Q HS  . Atrial fibrillation   . Breast cancer, stage 1 10/17/2011    s/p lumpectomy and XRT  . Cough   . Wheezing   . Chills   . Constipation   . Bruises easily   . Gout attack     ankle, then wrist and hands  . Hyperlipidemia     Past Surgical History  Procedure Laterality Date  . Total knee arthroplasty  2001  . Abdominal hysterectomy  2006  . Resection of uterine cancer    . Left lower leg fx--casted    . Breast lumpectomy Right     Prior to Admission medications   Medication Sig Start Date End Date Taking? Authorizing Provider  acetaminophen (TYLENOL) 500 MG tablet Take 1,000 mg by mouth every 6 (six) hours as needed. pain   Yes Historical Provider, MD  alendronate (FOSAMAX) 70 MG tablet  Take 70 mg by mouth every 7 (seven) days. Take with a full glass of water on an empty stomach. Do not sit for 30 minutes.  Takes on saturday 04/05/13  Yes Victorino December, MD  allopurinol (ZYLOPRIM) 100 MG tablet Take 200 mg by mouth daily.  11/04/12  Yes Historical Provider, MD  chlorpheniramine-HYDROcodone (TUSSIONEX) 10-8 MG/5ML LQCR Take 5 mLs by mouth every 12 (twelve) hours as needed. 05/14/13  Yes Joseph Art, DO  cholecalciferol (VITAMIN D) 1000 UNITS tablet Take 1 tablet (1,000 Units total) by mouth daily. 08/08/12 08/08/13 Yes Victorino December, MD  colchicine 0.6 MG tablet Take 1 tablet (0.6 mg total) by mouth 2 (two) times daily. 11/22/12  Yes [name], MD  diltiazem (TIAZAC) 360 MG 24 hr capsule Take 1 tablet by mouth daily.  06/06/11  Yes Historical Provider, MD  exemestane (AROMASIN) 25 MG tablet Take 1 tablet (25 mg total) by mouth daily after breakfast. 03/08/13  Yes Victorino December, MD  furosemide (LASIX) 40 MG tablet Take 1 tablet (40 mg total) by mouth daily. 05/14/13  Yes Joseph Art, DO  guaiFENesin (MUCINEX) 600 MG 12 hr tablet Take 1 tablet (600 mg total) by mouth 2 (two) times daily. 05/14/13  Yes Joseph Art, DO  HUMULIN  N 100 UNIT/ML injection Inject 5 Units into the skin as needed (5 units if blood sugar is greater than 150).  02/05/13  Yes Historical Provider, MD  HYDROcodone-acetaminophen (NORCO/VICODIN) 5-325 MG per tablet Take 1 tablet by mouth every 6 (six) hours as needed. For pain. 05/14/13  Yes Jessica U Vann, DO  insulin glargine (LANTUS) 100 UNIT/ML injection Inject 0.28 mLs (28 Units total) into the skin at bedtime. 05/14/13  Yes Jessica U Vann, DO  ipratropium (ATROVENT) 0.02 % nebulizer solution Take 2.5 mLs (0.5 mg total) by nebulization every 6 (six) hours. 11/22/12  Yes [name], MD  levalbuterol (XOPENEX) 0.63 MG/3ML nebulizer solution Take 3 mLs (0.63 mg total) by nebulization every 6 (six) hours. 11/22/12  Yes [name], MD  levofloxacin  (LEVAQUIN) 750 MG tablet Take 1 tablet (750 mg total) by mouth daily. 05/14/13  Yes Jessica U Vann, DO  LORazepam (ATIVAN) 0.5 MG tablet Take 0.5 mg by mouth at bedtime as needed. For sleep.   Yes Historical Provider, MD  meclizine (ANTIVERT) 25 MG tablet Take 6.25-12.5 mg by mouth 3 (three) times daily as needed. For dizziness.   Yes Historical Provider, MD  metFORMIN (GLUCOPHAGE) 500 MG tablet Take 500 mg by mouth 2 (two) times daily with a meal. If cbg is greater than 120 pt takes medication   Yes Historical Provider, MD  metoprolol tartrate (LOPRESSOR) 25 MG tablet Take 25 mg by mouth 2 (two) times daily.  05/13/11  Yes Historical Provider, MD  Multiple Vitamin (MULTIVITAMIN WITH MINERALS) TABS Take 1 tablet by mouth daily.   Yes Historical Provider, MD  potassium chloride SA (K-DUR,KLOR-CON) 20 MEQ tablet Take 20 mEq by mouth 2 (two) times daily.  02/11/13  Yes Historical Provider, MD  predniSONE (DELTASONE) 50 MG tablet 40 mg x 3 days, 30 mg x 3 days, 20 mg x 3 days, 10 mg x 3 days 05/14/13  Yes Joseph Art, DO  promethazine (PHENERGAN) 25 MG tablet Take 25 mg by mouth every 6 (six) hours as needed. For nausea.   Yes Historical Provider, MD  simvastatin (ZOCOR) 40 MG tablet Take 40 mg by mouth at bedtime.   Yes Historical Provider, MD  theophylline (THEODUR) 200 MG 12 hr tablet Take 0.5 tablets (100 mg total) by mouth 2 (two) times daily. 05/14/13  Yes Joseph Art, DO  warfarin (COUMADIN) 6 MG tablet Take 1 tablet (6 mg total) by mouth daily. 05/14/13  Yes Joseph Art, DO  Calcium Carbonate-Vitamin D (CALCIUM 600 + D PO) Take 1 tablet by mouth 2 (two) times daily.     Historical Provider, MD    Current Facility-Administered Medications  Medication Dose Route Frequency Provider Last Rate Last Dose  . 0.9 %  sodium chloride infusion  250 mL Intravenous PRN Leslye Peer, MD      . 0.9 %  sodium chloride infusion   Intravenous Continuous Leslye Peer, MD 50 mL/hr at 05/18/13 1627    .  acetaminophen (TYLENOL) solution 650 mg  650 mg Per Tube Q6H PRN Lupita Leash, MD      . albuterol (PROVENTIL) (5 MG/ML) 0.5% nebulizer solution 2.5 mg  2.5 mg Nebulization Q4H PRN Leslye Peer, MD      . insulin aspart (novoLOG) injection 0-15 Units  0-15 Units Subcutaneous Q4H Leslye Peer, MD   2 Units at 05/19/13 0042  . LORazepam (ATIVAN) tablet 0.5 mg  0.5 mg Oral QHS PRN Brooke Pace  McQuaid, MD   0.5 mg at 05/19/13 0001  . pantoprazole (PROTONIX) injection 40 mg  40 mg Intravenous Q12H Leslye Peer, MD   40 mg at 05/18/13 2131    Allergies as of 05/18/2013 - Review Complete 05/18/2013  Allergen Reaction Noted  . Aspirin  05/03/2013  . Contrast media (iodinated diagnostic agents) Nausea And Vomiting 05/11/2013  . Ibuprofen  05/03/2013  . Pravachol Other (See Comments) 04/20/2011  . Pravastatin sodium      Family History  Problem Relation Age of Onset  . Diabetes Mother   . Cancer Father     colon  . Diabetes Brother   . Cancer Sister   . Diabetes Sister     History   Social History  . Marital Status: Divorced    Spouse Name: N/A    Number of Children: N/A  . Years of Education: N/A   Occupational History  . RETIRED     [employer]   Social History Main Topics  . Smoking status: Never Smoker   . Smokeless tobacco: Never Used  . Alcohol Use: No  . Drug Use: No  . Sexually Active: Yes    Birth Control/ Protection: Post-menopausal, Surgical   Other Topics Concern  . Not on file   Social History Narrative   Widowed.  Lives alone.  Ambulates with a walker.    Review of Systems: Positive = bold Gen: Denies any fever, chills, rigors, night sweats, anorexia, fatigue, weakness, malaise, involuntary weight loss, and sleep disorder CV: Denies chest pain, angina, palpitations, syncope, orthopnea, PND, peripheral edema, and claudication. Resp: Denies dyspnea, cough, sputum, wheezing, coughing up blood. GI: Described in detail in HPI.    GU : Denies urinary  burning, blood in urine, urinary frequency, urinary hesitancy, nocturnal urination, and urinary incontinence. MS: Denies joint pain or swelling.  Denies muscle weakness, cramps, atrophy.  Derm: Denies rash, itching, oral ulcerations, hives, unhealing ulcers.  Psych: Denies depression, anxiety, memory loss, suicidal ideation, hallucinations,  and confusion. Heme: Denies bruising, bleeding, and enlarged lymph nodes. Neuro:  Denies any headaches, dizziness, paresthesias. Endo:  Denies any problems with DM, thyroid, adrenal function.  Physical Exam: Vital signs in last 24 hours: Temp:  [97.1 F (36.2 C)-98.2 F (36.8 C)] 97.6 F (36.4 C) (06/08 0735) Pulse Rate:  [58-109] 99 (06/08 0900) Resp:  [13-26] 18 (06/08 0900) BP: (82-129)/(40-78) 116/64 mmHg (06/08 0900) SpO2:  [94 %-98 %] 96 % (06/08 0900) Weight:  [113.9 kg (251 lb 1.7 oz)-115 kg (253 lb 8.5 oz)] 113.9 kg (251 lb 1.7 oz) (06/08 0500) Last BM Date: 05/18/13 General:   Alert,  Overweight, Well-developed, well-nourished, pleasant and cooperative in NAD Head:  Normocephalic and atraumatic. Eyes:  Sclera clear, no icterus.   Conjunctiva pink. Ears:  Normal auditory acuity. Nose:  No deformity, discharge,  or lesions. Mouth:  No deformity or lesions.  Oropharynx pink but somewhat dry. Neck:  Supple; no masses or thyromegaly. Lungs:  Clear throughout to auscultation.   No wheezes, crackles, or rhonchi. No acute distress. Heart: Irregularly irregular rhythm; normal rate; no murmurs, clicks, rubs,  or gallops. Abdomen:  Soft, protuberant, nontender and nondistended. No masses, hepatosplenomegaly or hernias noted. Normal bowel sounds, without guarding, and without rebound.     Msk:  Symmetrical without gross deformities. Normal posture. Pulses:  Normal pulses noted. Extremities:  Without clubbing or edema. Neurologic:  Alert and  oriented x4;  grossly normal neurologically. Skin:  Scattered ecchymoses; otherwise intact without  significant lesions  or rashes.. Psych:  Alert and cooperative. Normal mood and affect.   Lab Results:  Recent Labs  05/18/13 2058 05/19/13 0325 05/19/13 0505  WBC 20.1* 16.8* 15.6*  HGB 9.3* 8.6* 8.8*  HCT 28.2* 25.6* 26.5*  PLT 172 147* 135*   BMET  Recent Labs  05/18/13 1226 05/19/13 0505  NA 137 140  K 4.0 4.0  CL 102 104  CO2 27 33*  GLUCOSE 166* 74  BUN 18 20  CREATININE 0.76 0.88  CALCIUM 8.3* 7.7*   LFT  Recent Labs  05/18/13 1226  PROT 5.6*  ALBUMIN 2.8*  AST 18  ALT 23  ALKPHOS 62  BILITOT 0.6   PT/INR  Recent Labs  05/18/13 1602 05/19/13 0505  LABPROT 22.8* 16.0*  INR 2.11* 1.31    Studies/Results: No results found.  Impression:  1.  Painless hematochezia, in setting of coagulopathy.  BUN normal.  Known pancolonic diverticulosis.  Suspect colonic diverticular bleed.  Bleeding has resolved with correction of coagulopathy. 2.  Acute blood loss anemia.  Plan:  1.  Trial of clear liquids. 2.  Supportive care with serial CBCs, IV fluids. 3.  If bleeding recurs, would pursue nuclear medicine tagged RBC study as next step in management. 4.  Patient's most recent colonoscopy was about one year ago; I don't see need for repeating this at the present time. 5.  Will follow.  Thank you for the consult.   LOS: 1 day   Satya Buttram M  05/19/2013, 9:34 AM

## 2013-05-20 DIAGNOSIS — N179 Acute kidney failure, unspecified: Secondary | ICD-10-CM

## 2013-05-20 LAB — BASIC METABOLIC PANEL
Calcium: 7.9 mg/dL — ABNORMAL LOW (ref 8.4–10.5)
Creatinine, Ser: 0.88 mg/dL (ref 0.50–1.10)
GFR calc non Af Amer: 64 mL/min — ABNORMAL LOW (ref >90)
Glucose, Bld: 88 mg/dL (ref 70–99)
Sodium: 141 mEq/L (ref 135–145)

## 2013-05-20 LAB — GLUCOSE, CAPILLARY
Glucose-Capillary: 203 mg/dL — ABNORMAL HIGH (ref 70–99)
Glucose-Capillary: 84 mg/dL (ref 70–99)
Glucose-Capillary: 97 mg/dL (ref 70–99)
Glucose-Capillary: 98 mg/dL (ref 70–99)

## 2013-05-20 LAB — CBC
MCH: 28 pg (ref 26.0–34.0)
MCHC: 32.8 g/dL (ref 30.0–36.0)
Platelets: 110 10*3/uL — ABNORMAL LOW (ref 150–400)
RBC: 3.14 MIL/uL — ABNORMAL LOW (ref 3.87–5.11)
RDW: 17.6 % — ABNORMAL HIGH (ref 11.5–15.5)

## 2013-05-20 MED ORDER — INSULIN GLARGINE 100 UNIT/ML ~~LOC~~ SOLN
10.0000 [IU] | Freq: Every day | SUBCUTANEOUS | Status: DC
  Administered 2013-05-20 – 2013-05-21 (×2): 10 [IU] via SUBCUTANEOUS
  Filled 2013-05-20 (×5): qty 0.1

## 2013-05-20 MED ORDER — PANTOPRAZOLE SODIUM 40 MG IV SOLR
40.0000 mg | INTRAVENOUS | Status: DC
  Administered 2013-05-21: 40 mg via INTRAVENOUS
  Filled 2013-05-20 (×2): qty 40

## 2013-05-20 NOTE — Care Management Note (Addendum)
    Page 1 of 2   05/22/2013     1:01:31 PM   CARE MANAGEMENT NOTE 05/22/2013  Patient:  [name], [name]   Account Number:  [id number]  Date Initiated:  05/20/2013  Documentation initiated by:  Colleen Can  Subjective/Objective Assessment:   DX RECTAL BLEEDING     Action/Plan:   CM spoke with patient. Plans are for patient to return to her apartment where sons will be caregivers. She has RW, bath bench at home. Has high tiolet seat. States she is active with [facility] after prev admission.   Anticipated DC Date:  05/22/2013   Anticipated DC Plan:  HOME W HOME HEALTH SERVICES      DC Planning Services  CM consult      [facility] Choice  Resumption Of Svcs/PTA Provider   Choice offered to / List presented to:  C-1 Patient        HH arranged  HH-1 RN  HH-2 PT      [facility] agency  [facility]   Status of service:  Completed, signed off Medicare Important Message given?   (If response is "NO", the following Medicare IM given date fields will be blank) Date Medicare IM given:   Date Additional Medicare IM given:    Discharge Disposition:  HOME W HOME HEALTH SERVICES  Per UR Regulation:  Reviewed for med. necessity/level of care/duration of stay  If discussed at Long Length of Stay Meetings, dates discussed:    Comments:  05/22/13 [facility] RN,BSN NCM [phone] [facility] HHRN/PT ORDERED FOR HOME.  05/20/2013 Colleen Can BSN RN CCM [phone] Attending MD Please order resumption of [facility] services if needed upon patient's discharge. CM will follow.

## 2013-05-20 NOTE — Progress Notes (Addendum)
PULMONARY  / CRITICAL CARE MEDICINE  Name: [name] MRN: [id number] DOB: [birth year]    ADMISSION DATE:  05/18/2013 CONSULTATION DATE:  05/18/13  REFERRING MD :  [facility] - Dr. Fredderick Phenix PRIMARY SERVICE: PCCM  CHIEF COMPLAINT:  GI Bleed  BRIEF PATIENT DESCRIPTION: 72 y/o F with PMH of DM, OSA on CPAP,  Sarcoid, Afib on Coumadin admitted to [facility] ICU for hypotension in the setting of acute GI Bleed.   SIGNIFICANT EVENTS / STUDIES:  5/23-6/03 - Admit with near syncope episode with N/V, possible PNA ....................................................................................... 6/07- Admit with rectal bleeding   ANTIBIOTICS: levaquin 5/31 >> 6/7  HISTORY OF PRESENT ILLNESS: 72 y/o F with PMH of DM, OSA on CPAP,  Pulmonary Sarcoidosis, COPD / Asthma / Chronic Resp Fx on 2L O2 and theophylline, Afib on Coumadin with recent admit to [facility] with near syncopal episode with associated nausea & vomiting of unclear etiology, concern for aspiration PNA and decompensated CHF.  Cardiac work up at that time was negative.  Treated empirically for possible PNA based on CT chest w basilar infiltrates vs scar. Coumadin was adjusted at that time in the setting of abx.    Presented to [facility] ICU on 6/7 for with complaints of blood in the toilet with BM & ongoing through am.  ER evaluation demonstrated mild hypotension, nml H/H, afebrile, nml BMP.  Patient denies abd pain, shortness of breath, chest pain, n/v/d, sputum production.  Denies pain with BM. Endorses weakness. No further syncope.   SUBJECTIVE: Smear of blood on wiping this am No abd pain  VITAL SIGNS: Temp:  [97.3 F (36.3 C)-98.8 F (37.1 C)] 98.8 F (37.1 C) (06/09 0521) Pulse Rate:  [54-109] 86 (06/09 0521) Resp:  [9-21] 16 (06/09 0521) BP: (104-123)/(46-71) 104/59 mmHg (06/09 0521) SpO2:  [94 %-98 %] 98 % (06/09 0521) Weight:  [112.674 kg (248 lb 6.4 oz)] 112.674 kg (248 lb 6.4 oz) (06/09 0500)  HEMODYNAMICS:     VENTILATOR SETTINGS:    INTAKE / OUTPUT: Intake/Output     06/08 0701 - 06/09 0700 06/09 0701 - 06/10 0700   P.O. 650 480   I.V. (mL/kg) 1100 (9.8)    Blood     IV Piggyback     Total Intake(mL/kg) 1750 (15.5) 480 (4.3)   Urine (mL/kg/hr) 525 (0.2) 700 (1.2)   Total Output 525 700   Net +1225 -220        Urine Occurrence 3 x      PHYSICAL EXAMINATION: General:  Obese woman, NAD Neuro:  Awake, alert, non-focal HEENT:  OP dry, no lesions, PERRL Cardiovascular:  Regular, distant, no M Lungs:  Clear B, decreased at bases.  Abdomen:  Obese, soft, NT/ND Musculoskeletal:  No edema, no deformities Skin:  No rash  LABS:  Recent Labs Lab 05/18/13 1226  05/18/13 1602  05/19/13 0505 05/19/13 1647 05/20/13 0433  HGB 12.9  < > 11.7*  < > 8.8* 8.1* 8.8*  WBC 19.4*  --   --   < > 15.6* 10.0 8.9  PLT 194  --   --   < > 135* 107* 110*  NA 137  --   --   --  140  --  141  K 4.0  --   --   --  4.0  --  3.7  CL 102  --   --   --  104  --  106  CO2 27  --   --   --  33*  --  30  GLUCOSE 166*  --   --   --  74  --  88  BUN 18  --   --   --  20  --  13  CREATININE 0.76  --   --   --  0.88  --  0.88  CALCIUM 8.3*  --   --   --  7.7*  --  7.9*  MG  --   --   --   --  1.7  --   --   PHOS  --   --   --   --  2.3  --   --   AST 18  --   --   --   --   --   --   ALT 23  --   --   --   --   --   --   ALKPHOS 62  --   --   --   --   --   --   BILITOT 0.6  --   --   --   --   --   --   PROT 5.6*  --   --   --   --   --   --   ALBUMIN 2.8*  --   --   --   --   --   --   INR 4.47*  --  2.11*  --  1.31  --   --   LATICACIDVEN  --   --  1.6  --   --   --   --   < > = values in this interval not displayed.  Recent Labs Lab 05/19/13 2040 05/20/13 0007 05/20/13 0459 05/20/13 0741 05/20/13 1141  GLUCAP 130* 98 84 97 164*    CXR:   ASSESSMENT / PLAN:  GASTROINTESTINAL A:   Rectal Bleeding - acute onset am of 6/7. In setting coagulopathy on coumadin. Hgb stable , hemodynamically  stable. Favor diverticular bleed Note hx of breast CA.  Hx Hernia Morbid Obesity Constipation   P:   -Vit K and FFP given  -hold anti-coagulation / place SCD's -CBC Q12 -PPI Q24    PULMONARY A: Chronic Respiratory Failure - on baseline O2 2L Sarcoidosis  Asthma - on baseline theophylline, hopeful to wean off with Dr. Maple Hudson due to cost OSA - on baseline CPAP  P:   -continue O2 -HOLD theophylline,  Level on 5/31 15.5 -auto-set CPAP QHS -PRN BD's  CARDIOVASCULAR A:  Mild Hypotension - in setting of rectal bleeding, hgb stable Atrial Fibrillation - on coumadin  HLD   P:  -hold lasix, resume metoprolol, diltiazem (on 360 ER at home)  HEMATOLOGIC A:   Coagulopathy - due to levaquin  P:  -follow H/H, serial CBC -hold coumadin until GI FU  INFECTIOUS A:   No acute infectious process. Recently treated for possible LL PNA's (8 days levaquin)  P:  -monitor fever curve / leukocytosis  ENDOCRINE A:   Hyperglycemia / DM   P:   -SSI   NEUROLOGIC A:   Anxiety   P:   -hold ativan with hypotension  Discussed with son Marcial Pacas, in detail. Reassured.  relayed his concerns to GI, Dr Matthias Hughs. Will need GI FU on dc   I have personally obtained a history, examined the patient, evaluated laboratory and imaging results, formulated the assessment and plan and placed orders.  [name] MD. Tonny Bollman. [facility] Pager  [phone] If no response call [phone]   05/20/2013, 12:16 PM

## 2013-05-20 NOTE — Progress Notes (Signed)
UTILIZATION REVIEW COMPLETED. 

## 2013-05-20 NOTE — Progress Notes (Signed)
Pt has had dark red smears on toilet paper when wiping rectum twice during the night.  Berton Bon RN-BC

## 2013-05-20 NOTE — Progress Notes (Signed)
Nutrition Brief Note  Patient identified on the Malnutrition Screening Tool (MST) Report  Body mass index is 50.14 kg/(m^2). Patient meets criteria for class III extreme obesity based on current BMI.   Current diet order is full liquid, patient is consuming approximately 100% of meals at this time. Labs and medications reviewed. Met with pt who reports good appetite PTA, 3 meals/day.    No nutrition interventions warranted at this time. If nutrition issues arise, please consult RD.   Levon Hedger MS, RD, LDN [phone] Pager [phone] After Hours Pager

## 2013-05-20 NOTE — Progress Notes (Signed)
Hemoglobin stable at 8.8 over the past 24 hours. INR was normal yesterday, showing complete correction of her anticoagulation. Her last bleeding was about 12 hours ago, and was smaller in amount. She did see a small smear of blood when she urinated this morning.  Impression: I agree with the clinical impression of diverticular bleeding, which has now stopped. Also, moderate posthemorrhagic acute anemia.  Recommendation: Advance to full liquid diet now, and possibly to a regular diet tomorrow.  Florencia Reasons, M.D. [phone]

## 2013-05-21 LAB — CBC
MCH: 28 pg (ref 26.0–34.0)
MCHC: 33 g/dL (ref 30.0–36.0)
Platelets: 99 10*3/uL — ABNORMAL LOW (ref 150–400)

## 2013-05-21 LAB — GLUCOSE, CAPILLARY
Glucose-Capillary: 213 mg/dL — ABNORMAL HIGH (ref 70–99)
Glucose-Capillary: 212 mg/dL — ABNORMAL HIGH (ref 70–99)
Glucose-Capillary: 204 mg/dL — ABNORMAL HIGH (ref 70–99)

## 2013-05-21 MED ORDER — DILTIAZEM HCL 60 MG PO TABS
60.0000 mg | ORAL_TABLET | Freq: Four times a day (QID) | ORAL | Status: DC
Start: 2013-05-21 — End: 2013-05-22
  Administered 2013-05-21 – 2013-05-22 (×5): 60 mg via ORAL
  Filled 2013-05-21 (×8): qty 1

## 2013-05-21 NOTE — Progress Notes (Signed)
PULMONARY  / CRITICAL CARE MEDICINE  Name: [name] MRN: [id number] DOB: May 06, 1941    ADMISSION DATE:  05/18/2013 CONSULTATION DATE:  05/18/13  REFERRING MD :  EDP - Dr. Fredderick Phenix PRIMARY SERVICE: PCCM  CHIEF COMPLAINT:  GI Bleed  BRIEF PATIENT DESCRIPTION: 72 y/o F with PMH of DM, OSA on CPAP,  Sarcoid, Afib on Coumadin admitted to [facility] ICU for hypotension in the setting of acute GI Bleed.   SIGNIFICANT EVENTS / STUDIES:  5/23-6/03 - Admit with near syncope episode with N/V, possible PNA ....................................................................................... 6/07- Admit with LGI bleeding   ANTIBIOTICS: levaquin 5/31 >> 6/7  HISTORY OF PRESENT ILLNESS: 72 y/o F with PMH of DM, OSA on CPAP,  Pulmonary Sarcoidosis, COPD / Asthma / Chronic Resp Fx on 2L O2 and theophylline, Afib on Coumadin with recent admit to [facility] with near syncopal episode with associated nausea & vomiting of unclear etiology, concern for aspiration PNA and decompensated CHF.  Cardiac work up at that time was negative.  Treated empirically for possible PNA based on CT chest w basilar infiltrates vs scar. Coumadin was adjusted at that time in the setting of abx.    Presented to [facility] ICU on 6/7 for with complaints of blood in the toilet with BM & ongoing through am.  ER evaluation demonstrated mild hypotension, nml H/H, afebrile, nml BMP.  Patient denies abd pain, shortness of breath, chest pain, n/v/d, sputum production.  Denies pain with BM. Endorses weakness. No further syncope.   SUBJECTIVE: No BM this am No abd pain, CP or palpitations  VITAL SIGNS: Temp:  [97.4 F (36.3 C)-97.8 F (36.6 C)] 97.4 F (36.3 C) (06/10 0451) Pulse Rate:  [64-98] 64 (06/10 0451) Resp:  [18-20] 18 (06/10 0451) BP: (110-115)/(52-62) 111/56 mmHg (06/10 0451) SpO2:  [98 %-100 %] 100 % (06/10 0451)  HEMODYNAMICS:       INTAKE / OUTPUT: Intake/Output     06/09 0701 - 06/10 0700 06/10 0701 - 06/11  0700   P.O. 1440    I.V. (mL/kg)     Total Intake(mL/kg) 1440 (12.8)    Urine (mL/kg/hr) 2100 (0.8)    Total Output 2100     Net -660            PHYSICAL EXAMINATION: General:  Obese woman, NAD Neuro:  Awake, alert, non-focal HEENT:  OP dry, no lesions, PERRL Cardiovascular:  irRegular, distant, no M Lungs:  Clear B, decreased at bases.  Abdomen:  Obese, soft, NT/ND Musculoskeletal:  No edema, no deformities Skin:  No rash  LABS:  Recent Labs Lab 05/18/13 1226  05/18/13 1602  05/19/13 0505 05/19/13 1647 05/20/13 0433 05/21/13 0437  HGB 12.9  < > 11.7*  < > 8.8* 8.1* 8.8* 8.6*  WBC 19.4*  --   --   < > 15.6* 10.0 8.9 8.3  PLT 194  --   --   < > 135* 107* 110* 99*  NA 137  --   --   --  140  --  141  --   K 4.0  --   --   --  4.0  --  3.7  --   CL 102  --   --   --  104  --  106  --   CO2 27  --   --   --  33*  --  30  --   GLUCOSE 166*  --   --   --  74  --  88  --   BUN 18  --   --   --  20  --  13  --   CREATININE 0.76  --   --   --  0.88  --  0.88  --   CALCIUM 8.3*  --   --   --  7.7*  --  7.9*  --   MG  --   --   --   --  1.7  --   --   --   PHOS  --   --   --   --  2.3  --   --   --   AST 18  --   --   --   --   --   --   --   ALT 23  --   --   --   --   --   --   --   ALKPHOS 62  --   --   --   --   --   --   --   BILITOT 0.6  --   --   --   --   --   --   --   PROT 5.6*  --   --   --   --   --   --   --   ALBUMIN 2.8*  --   --   --   --   --   --   --   INR 4.47*  --  2.11*  --  1.31  --   --   --   LATICACIDVEN  --   --  1.6  --   --   --   --   --   < > = values in this interval not displayed.  Recent Labs Lab 05/20/13 1723 05/20/13 2058 05/21/13 0006 05/21/13 0405 05/21/13 0729  GLUCAP 203* 131* 111* 128* 102*     ASSESSMENT / PLAN:  GASTROINTESTINAL A:   LGI Bleeding - acute onset am of 6/7. In setting coagulopathy on coumadin. Hgb stable , hemodynamically stable. Favor diverticular bleed Note hx of breast CA.  Hx Hernia Morbid  Obesity Constipation   P:   -hold anti-coagulation / place SCD's -PPI Q24  PULMONARY A: Chronic Respiratory Failure - on baseline O2 2L Sarcoidosis  Asthma - on baseline theophylline, hopeful to wean off with Dr. Maple Hudson due to cost OSA - on baseline CPAP  P:   -continue O2 -HOLD theophylline,  Level on 5/31 15.5 -auto-set CPAP QHS -PRN BD's  CARDIOVASCULAR A:  Mild Hypotension - in setting of rectal bleeding, hgb stable Atrial Fibrillation - on coumadin  HLD   P:  -hold lasix, resume metoprolol, diltiazem 60 q6(on 360 ER at home)  HEMATOLOGIC A:   Coagulopathy - due to levaquin  P:  -follow H/H, serial CBC -hold coumadin until GI FU  INFECTIOUS A:   No acute infectious process. Recently treated for possible LL PNA's (8 days levaquin)  P:  -monitor fever curve / leukocytosis  ENDOCRINE A:   Hyperglycemia / DM   P:   -SSI   NEUROLOGIC A:   Anxiety   P:   -hold ativan with hypotension   Appreciate GI input - Dr Matthias Hughs. Will need GI FU on dc Hope for dc in 24h if no bleeding on full diet   I have personally obtained a history, examined the patient, evaluated laboratory and imaging results, formulated the assessment and plan and  placed orders.  [name] MD. Tonny Bollman. [facility] Pager [phone] If no response call [phone]   05/21/2013, 10:39 AM

## 2013-05-21 NOTE — Progress Notes (Signed)
No further bleeding. Hemoglobin stable over the past 48 hours.  I will attempt to reach the patient's son, who has expressed concerns as the patient might have colon cancer and needs an updated colonoscopy. This might well be appropriate, but I feel should be deferred until the patient is an outpatient, stronger, her hemoglobin has come up further, etc.  Because diverticular bleeding can recur after a couple of days of quiescence, I have recommended that the patient be kept in-house for one more day. I have written to advance her diet.  [name], M.D. [phone]

## 2013-05-21 NOTE — Progress Notes (Signed)
Addendum: I have made an outpatient followup appointment for the patient with her primary gastroenterologist, Dr. Carman Ching, for July 2 at 8:30 AM, and have notified the patient accordingly.  Florencia Reasons, M.D. [phone]

## 2013-05-22 ENCOUNTER — Telehealth: Payer: Self-pay | Admitting: Internal Medicine

## 2013-05-22 DIAGNOSIS — J690 Pneumonitis due to inhalation of food and vomit: Secondary | ICD-10-CM

## 2013-05-22 LAB — GLUCOSE, CAPILLARY
Glucose-Capillary: 140 mg/dL — ABNORMAL HIGH (ref 70–99)
Glucose-Capillary: 75 mg/dL (ref 70–99)

## 2013-05-22 LAB — CBC
Hemoglobin: 8.3 g/dL — ABNORMAL LOW (ref 12.0–15.0)
MCHC: 32.2 g/dL (ref 30.0–36.0)
Platelets: 107 10*3/uL — ABNORMAL LOW (ref 150–400)
RBC: 3 MIL/uL — ABNORMAL LOW (ref 3.87–5.11)

## 2013-05-22 NOTE — Discharge Summary (Signed)
Physician Discharge Summary  Patient ID: [name] MRN: [id number] DOB/AGE: Jan 20, 1941 72 y.o.  Admit date: 05/18/2013 Discharge date: 05/22/2013  Problem List Active Problems:   PULMONARY SARCOIDOSIS   Atrial fibrillation   Obstructive sleep apnea   Gout   Diabetes mellitus   Lower GI bleed  HPI: 72 y/o F with PMH of DM, OSA on CPAP, Pulmonary Sarcoidosis, COPD / Asthma / Chronic Resp Fx on 2L O2 and theophylline, Afib on Coumadin with recent admit to [facility] with near syncopal episode with associated nausea & vomiting of unclear etiology, concern for aspiration PNA and decompensated CHF. Cardiac work up at that time was negative. Treated empirically for possible PNA based on CT chest w basilar infiltrates vs scar. Coumadin was adjusted at that time in the setting of abx.  Presented to [facility] ICU on 6/7 for with complaints of blood in the toilet with BM & ongoing through am. ER evaluation demonstrated mild hypotension, nml H/H, afebrile, nml BMP. Patient denies abd pain, shortness of breath, chest pain, n/v/d, sputum production. Denies pain with BM. Endorses weakness. No further syncope.     Hospital Course:  SIGNIFICANT EVENTS / STUDIES:  5/23-6/03 - Admit with near syncope episode with N/V, possible PNA  .......................................................................................  6/07- Admit with LGI bleeding  ANTIBIOTICS:  levaquin 5/31 >> 6/7    ASSESSMENT / PLAN:  GASTROINTESTINAL  A:  LGI Bleeding - acute onset am of 6/7. In setting coagulopathy on coumadin. Hgb stable , hemodynamically stable.  Favor diverticular bleed Note hx of breast CA.  Hx Hernia  Morbid Obesity  Constipation  P:  -hold anti-coagulation / place SCD's  -PPI Q24  PULMONARY  A:  Chronic Respiratory Failure - on baseline O2 2L  Sarcoidosis  Asthma - on baseline theophylline, hopeful to wean off with Dr. Maple Hudson due to cost  OSA - on baseline CPAP  P:  -continue O2   -HOLD theophylline, Level on 5/31 15.5  -auto-set CPAP QHS  -PRN BD's  CARDIOVASCULAR  A:  Mild Hypotension - in setting of rectal bleeding, hgb stable  Atrial Fibrillation - on coumadin  HLD  P:  -hold lasix, resume metoprolol, diltiazem (on 360 ER at home)  HEMATOLOGIC  A:  Coagulopathy - due to levaquin  P:  -follow H/H, serial CBC  -hold coumadin until GI FU  INFECTIOUS  A:  No acute infectious process. Recently treated for possible LL PNA's (8 days levaquin)  P: -monitor fever curve / leukocytosis  ENDOCRINE  A:  Hyperglycemia / DM  P:  -SSI  6-11 resume home meds. NEUROLOGIC  A:  Anxiety  P:  -hold ativan with hypotension  Appreciate GI input - Dr Matthias Hughs.  GI FU with Dr. Randa Evens scheduled as outpatient.     Labs at discharge Lab Results  Component Value Date   CREATININE 0.88 05/20/2013   BUN 13 05/20/2013   NA 141 05/20/2013   K 3.7 05/20/2013   CL 106 05/20/2013   CO2 30 05/20/2013   Lab Results  Component Value Date   WBC 7.8 05/22/2013   HGB 8.3* 05/22/2013   HCT 25.8* 05/22/2013   MCV 86.0 05/22/2013   PLT 107* 05/22/2013   Lab Results  Component Value Date   ALT 23 05/18/2013   AST 18 05/18/2013   ALKPHOS 62 05/18/2013   BILITOT 0.6 05/18/2013   Lab Results  Component Value Date   INR 1.31 05/19/2013   INR 2.11* 05/18/2013   INR 4.47* 05/18/2013  Current radiology studies No results found.  Disposition:  06-Home-Health Care Svc  Discharge Orders   Future Appointments Provider Department Dept Phone   06/06/2013 11:15 AM Waymon Budge, MD [facility] [phone]   09/09/2013 10:30 AM Krista [place] [facility] MEDICAL ONCOLOGY [phone]   09/09/2013 11:00 AM Victorino December, MD [facility] MEDICAL ONCOLOGY [phone]   11/04/2013 11:00 AM Waymon Budge, MD [facility] [phone]   Future Orders Complete By Expires     Discharge patient  As directed         Medication List    STOP taking  these medications       alendronate 70 MG tablet  Commonly known as:  FOSAMAX     CALCIUM 600 + D PO     cholecalciferol 1000 UNITS tablet  Commonly known as:  VITAMIN D     furosemide 40 MG tablet  Commonly known as:  LASIX     levofloxacin 750 MG tablet  Commonly known as:  LEVAQUIN     potassium chloride SA 20 MEQ tablet  Commonly known as:  K-DUR,KLOR-CON     predniSONE 50 MG tablet  Commonly known as:  DELTASONE     theophylline 200 MG 12 hr tablet  Commonly known as:  THEODUR     warfarin 6 MG tablet  Commonly known as:  COUMADIN      TAKE these medications       acetaminophen 500 MG tablet  Commonly known as:  TYLENOL  Take 1,000 mg by mouth every 6 (six) hours as needed. pain     allopurinol 100 MG tablet  Commonly known as:  ZYLOPRIM  Take 200 mg by mouth daily.     chlorpheniramine-HYDROcodone 10-8 MG/5ML Lqcr  Commonly known as:  TUSSIONEX  Take 5 mLs by mouth every 12 (twelve) hours as needed.     colchicine 0.6 MG tablet  Take 1 tablet (0.6 mg total) by mouth 2 (two) times daily.     diltiazem 360 MG 24 hr capsule  Commonly known as:  TIAZAC  Take 1 tablet by mouth daily.     exemestane 25 MG tablet  Commonly known as:  AROMASIN  Take 1 tablet (25 mg total) by mouth daily after breakfast.     guaiFENesin 600 MG 12 hr tablet  Commonly known as:  MUCINEX  Take 1 tablet (600 mg total) by mouth 2 (two) times daily.     HUMULIN N 100 UNIT/ML injection  Generic drug:  insulin NPH  Inject 5 Units into the skin as needed (5 units if blood sugar is greater than 150).     HYDROcodone-acetaminophen 5-325 MG per tablet  Commonly known as:  NORCO/VICODIN  Take 1 tablet by mouth every 6 (six) hours as needed. For pain.     insulin glargine 100 UNIT/ML injection  Commonly known as:  LANTUS  Inject 0.28 mLs (28 Units total) into the skin at bedtime.     ipratropium 0.02 % nebulizer solution  Commonly known as:  ATROVENT  Take 2.5 mLs (0.5 mg total)  by nebulization every 6 (six) hours.     levalbuterol 0.63 MG/3ML nebulizer solution  Commonly known as:  XOPENEX  Take 3 mLs (0.63 mg total) by nebulization every 6 (six) hours.     LORazepam 0.5 MG tablet  Commonly known as:  ATIVAN  Take 0.5 mg by mouth at bedtime as needed. For sleep.  meclizine 25 MG tablet  Commonly known as:  ANTIVERT  Take 6.25-12.5 mg by mouth 3 (three) times daily as needed. For dizziness.     metFORMIN 500 MG tablet  Commonly known as:  GLUCOPHAGE  Take 500 mg by mouth 2 (two) times daily with a meal. If cbg is greater than 120 pt takes medication     metoprolol tartrate 25 MG tablet  Commonly known as:  LOPRESSOR  Take 25 mg by mouth 2 (two) times daily.     multivitamin with minerals Tabs  Take 1 tablet by mouth daily.     promethazine 25 MG tablet  Commonly known as:  PHENERGAN  Take 25 mg by mouth every 6 (six) hours as needed. For nausea.     simvastatin 40 MG tablet  Commonly known as:  ZOCOR  Take 40 mg by mouth at bedtime.           Follow-up Information   Follow up with Waymon Budge, MD On 06/06/2013. (11:15 am)    Contact information:   [address]  [facility] [place] [address] [phone]       Follow up with EDWARDS Burna Mortimer, MD. (keep your appointment)    Contact information:   [address]                         [place] [address] [phone]       Follow up with [name], MD. (keep yor appoinment on june 20th)    Contact information:   [facility] [address] [place] [address] [phone]        Discharged Condition: fair  Time spent on discharge greater than 60 minutes.  Vital signs at Discharge. Temp:  [97.6 F (36.4 C)-98.2 F (36.8 C)] 97.6 F (36.4 C) (06/11 0451) Pulse Rate:  [70-92] 78 (06/11 0451) Resp:  [16-20] 18 (06/11 0451) BP: (104-130)/(44-60) 130/60 mmHg (06/11 0451) SpO2:   [97 %-100 %] 100 % (06/11 0451) Weight:  [114.7 kg (252 lb 13.9 oz)] 114.7 kg (252 lb 13.9 oz) (06/11 0451) Office follow up Special Information or instructions. She will follow up with Dr. Maple Hudson. Note she is off theophylline. She is not to restart coumadin till cleared by GI. Note her lasix is on hold as well fosamax.  Signed: Brett Canales Minor ACNP [facility] P[facility] Pager [phone] till 3 pm If no answer page [phone] 05/22/2013, 9:18 AM  No further bleeding noted, patient ready for d/c.  See f/u appointments above and in the order section.  Patient seen and examined, agree with above note.  I dictated the care and orders written for this patient under my direction.  Alyson Reedy, MD [phone]

## 2013-05-22 NOTE — Telephone Encounter (Signed)
Called spoke with Joan Flores who reported that she has no way to get in touch with Dr Molli Knock as she is not in ICU. Dr Percival Spanish pager and cell phone given to [facility] with advice to page him first and if he does not answer then to call after . Nothing further needed at this time; will sign off.

## 2013-05-22 NOTE — Progress Notes (Signed)
Agree with plans for discharge today.  The patient did have a bowel movement yesterday evening, that showed very dark, old blood, as might be expected from delayed passage of old blood. No bowel movements since then.   Minimal decrease in hemoglobin in the past 24 hours, from 8.6-8.3, but I do not feel that this patient shows any evidence of ongoing bleeding, and she is far enough out from her most recent active bleeding that recurrent bleeding at this point is quite unlikely.  As noted yesterday, followup with Dr. Randa Evens has been arranged for about 3 weeks from now. At that time, a decision about what form of diagnostic testing (hemoglobin, Hemoccults, and/or colonoscopy) is warranted, taking into account the patient's previous evaluation and her clinical evolution following this hospitalization.  Furthermore, a decision can be made at that time about whether and when to restart Coumadin. Presumably, this will occur in concert with her primary physician or whoever has her on the Coumadin in the first place, taking into account the risks and benefits of resumption of anticoagulation in this particular patient.  I did speak with her son, Marcial Pacas, on the telephone yesterday at some length, and explained that the decision of what type of further evaluation to perform would be made as an outpatient when she sees Dr. Randa Evens.  [name], M.D. [phone]

## 2013-06-06 ENCOUNTER — Ambulatory Visit (INDEPENDENT_AMBULATORY_CARE_PROVIDER_SITE_OTHER): Payer: Medicare Other | Admitting: Internal Medicine

## 2013-06-06 ENCOUNTER — Ambulatory Visit (INDEPENDENT_AMBULATORY_CARE_PROVIDER_SITE_OTHER)
Admission: RE | Admit: 2013-06-06 | Discharge: 2013-06-06 | Disposition: A | Discharge status: Home / Self Care | Payer: Medicare Other | Source: Ambulatory Visit | Attending: Internal Medicine | Admitting: Internal Medicine

## 2013-06-06 ENCOUNTER — Encounter: Payer: Self-pay | Admitting: Internal Medicine

## 2013-06-06 VITALS — BP 128/84 | HR 89 | Ht 59.0 in | Wt 262.2 lb

## 2013-06-06 DIAGNOSIS — J690 Pneumonitis due to inhalation of food and vomit: Secondary | ICD-10-CM

## 2013-06-06 DIAGNOSIS — D649 Anemia, unspecified: Secondary | ICD-10-CM

## 2013-06-06 DIAGNOSIS — D869 Sarcoidosis, unspecified: Secondary | ICD-10-CM

## 2013-06-06 DIAGNOSIS — R918 Other nonspecific abnormal finding of lung field: Secondary | ICD-10-CM

## 2013-06-06 NOTE — Patient Instructions (Addendum)
Order- CXR dx aspiration pneumonia, lung nodules, hx breast CA, hx sarcoid   Order- future  CT chest, non-contrast  To be done before next ov     Dx lung nodules   Please keep appointments with your other doctors to follow-up on your bleeding and anemia. Call us as needed

## 2013-06-06 NOTE — Progress Notes (Signed)
Patient ID: [name], female    DOB: 20-Jul-1941, 72 y.o.   MRN: [id number]  HPI  06/13/11- 26 yoF never smoker, hx of OSA on CPAP Hx Sarcoid,, PAF-chronic coumadin , Morbid obesity   Last here December 21, 2010 - Note reviewed Blames heat for lack energy. She also had right breast lumpectomy and XRT in February. Now pending right knee replacement next week.  Got good report at cardiology last week, following for her AFIb. She had stopped ritalin as ineffective for complaints of tiredness before.  Continues CPAPat 9 cwp.   08/05/2011 Follow up  Pt presents today for follow up of O2. PT recently had R. TKR on 06/20/11 w/ rehab stay. She did have some desaturations post op and was started on o2 at 2l /m . Per pt she was discharged to rehab on o2. She had swelling in right lower leg and had venous doppler that was neg for DVT. She is on chronic coumadin for hx of a fib. She says she does wear out easily and has DOE. Does fine at rest w/ no dyspnea.  Wants to see if she can get off O2. Today in office O2 sat at rest was 94%. Walking O2 sat is 92% w/out desaturations.  Denies chest pain or hemoptysis. Weight is down 6 lbs since last ov.  No increased cough or congestion  Wearing CPAP each night  She was discharge home on 08/02/11 . No records from rehab available at todays visit. Will attempt to obtain.   09/22/11-69 yoF never smoker, hx of OSA on CPAP Hx Sarcoid,, PAF-chronic coumadin , Morbid obesity She is staying on her oxygen most of the time. Consider comfortably on room air for a while at rest is is it for sleep and exertion. She is having difficulty getting around with a walker and managing her oxygen tank. Her home care company says it does not have a small portable type that would work for her. She continues her CPAP every night/Advanced. Says she has lost 22 pounds since her knee replacement surgery.  1//3/12- - 69 yoF never smoker, hx of OSA on CPAP Hx Sarcoid,, PAF-chronic coumadin ,  Morbid obesity Still having SOB, slight wheezing at times. cough-productive-clear in color; denies any fever and chills She got portable oxygen/Advanced. Comfortable with her CPAP and using it every night 3 at Has felt sick since Thanksgiving. Now she just doesn't feel she can clear her airways of mucus. She took 2 rounds of antibiotics. Using her nebulizer machine.  05/04/12- 69 yoF never smoker, hx of OSA on CPAP Hx Sarcoid,, PAF-chronic coumadin , Morbid obesity Breathing is fine as along as using O2 as needed Uses oxygen at 2 L for sleep and when needed. She may need to be requalified. Occasional minor wheeze in the mornings does not bother her enough to use her rescue inhaler.  11/02/12- 71 yoF never smoker, hx of OSA on CPAP Hx Sarcoid,, PAF-chronic coumadin , Morbid obesity FOLLOWS FOR: SOB (mostly with activity) and wheezing; Still using O2 as needed Has had flu vaccine. She is pleased to report no issues at all with no recent colds or respiratory events. She is trying to lose some weight. Using oxygen 2L/ Advanced for sleep and if needed.  06/06/13-71 yoF never smoker, hx of OSA on CPAP Hx Sarcoid,, PAF-chronic coumadin , Morbid obesity Review of Systems-see HPI FOLLOWS FOR: Post hospital; Continues to have SOB-worse with heat Hospitalized with aspiration pneumonia and then with acute GI  bleed. Now denies abdominal pain, nausea obvious blood loss. She was left on theophylline. Still feels "washed out and tired" CT chest 05/11/13- IMPRESSION:  1. No evidence pulmonary embolism.  2. Right greater than left lower lobe predominant airspace  disease, most consistent with infection.  3. Scattered pulmonary nodules. Nonspecific, especially given the  clinical history of sarcoidosis and breast cancer. Consider short-  term CT follow-up at 3 months to exclude metastasis.  4. Thoracic adenopathy, which could be reactive or due to  sarcoidosis. Metastatic disease cannot be excluded. This could   also be reevaluated at follow-up.  5. Small hiatal hernia.  6. 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  Original Report Authenticated By: Jeronimo Greaves, M.D  ROS: Constitutional:  No--weight loss, no-night sweats, fevers, chills, +fatigue, lassitude. HEENT:   No-  headaches, difficulty swallowing, tooth/dental problems, sore throat,       No-  sneezing, itching, ear ache, nasal congestion, post nasal drip,  CV:  No-   chest pain, orthopnea, PND, swelling in lower extremities, anasarca,  dizziness, palpitations Resp: + shortness of breath with exertion, not at rest.              No-   productive cough,  No non-productive cough,  No-  coughing up of blood.              No-   change in color of mucus.  No- wheezing.   Skin: No-   rash or lesions. GI:  No-   heartburn, indigestion, abdominal pain, nausea, vomiting, GU:  MS:  No-   joint pain or swelling.   Neuro-  nothing unusual Psych:  No- change in mood or affect. No depression or anxiety.  No memory loss.   Objective:   Physical Exam General- Alert, Oriented, Affect-appropriate, Distress- none acute, morbidly obese, using a walker and portable oxygen 2L Skin- looks pale Lymphadenopathy- none Head- atraumatic            Eyes- Gross vision intact, PERRLA, conjunctivae clear secretions            Ears- Hearing, canals-normal            Nose- Clear, no-Septal dev, mucus, polyps, erosion, perforation             Throat- Mallampati II , mucosa clear , drainage- none, tonsils- atrophic Neck- flexible , trachea midline, no stridor , thyroid nl, carotid no bruit Chest - symmetrical excursion , unlabored           Heart/CV- nearly regular with occasional skipped , no murmur , no gallop  , no rub, nl s1 s2                           - JVD- none , 2-3+ edema bilaterally, stasis changes- none, varices- none           Lung-  Clear,+few crackles,  no- cough , dullness-none, rub- none           Chest wall-   Abd-  Br/ Gen/ Rectal- Not done, not indicated Extrem- cyanosis- none, clubbing, none, atrophy- none, strength- nl Neuro- grossly intact to observation

## 2013-06-19 NOTE — Progress Notes (Signed)
Quick Note:  Pt aware of results. ______ 

## 2013-06-22 NOTE — Assessment & Plan Note (Signed)
Plan-reflux precautions were reinforced. Chest x-ray

## 2013-06-22 NOTE — Assessment & Plan Note (Addendum)
Clinically in remission. Nodules and adenopathy on CT are  Watched because of her history of sarcoid and of breast cancer. Plan-followup chest CT in August because of nodules

## 2013-06-22 NOTE — Assessment & Plan Note (Signed)
Discussed how recent lower GI bleed and blood loss anemia will contribute to fatigue and dyspnea with exertion.

## 2013-07-17 ENCOUNTER — Telehealth: Payer: Self-pay | Admitting: Internal Medicine

## 2013-07-17 DIAGNOSIS — Z79899 Other long term (current) drug therapy: Secondary | ICD-10-CM

## 2013-07-17 NOTE — Telephone Encounter (Signed)
Pt is calling regarding her coumadin. Dr. Randa Evens wanted her to talk to someone about the coumadin

## 2013-07-17 NOTE — Telephone Encounter (Signed)
Returned call.  Pt stated her Hgb was low and Dr. Randa Evens (GI) had her on iron.  Stated it has come up, but her iron is still low.  Stated Dr. Randa Evens told her to find out if Dr. Rennis Golden wants to restart coumadin and that Dr. Rennis Golden can contact him to share records.  Pt informed Dr. Rennis Golden will be notified and his nurse will f/u with a response.  Pt verbalized understanding and agreed w/ plan.  Message forwarded to Dr. Rennis Golden.

## 2013-07-20 NOTE — Telephone Encounter (Signed)
She has persistent a-fib and a number of risk factors for stroke.  We could consider re-starting warfarin or switch her to Eliquis, which may have less bleeding risk. I would be in favor of switching to Eliquis 5 mg po BID.  -[place]

## 2013-07-22 NOTE — Telephone Encounter (Signed)
Please advise 

## 2013-07-23 MED ORDER — APIXABAN 5 MG PO TABS: 5.0000 mg | ORAL_TABLET | Freq: Two times a day (BID) | ORAL | Status: DC

## 2013-07-23 NOTE — Telephone Encounter (Signed)
Called patient with recommendation of switching from coumadin to Eliquis 5mg  BID per Dr. Rennis Golden. Per Phillips Hay, RPH recommendation, a CBC and CMET will need to be drawn 2-3 weeks from medication start date. Med and Lab orders placed. Lab slips mailed to patient.

## 2013-07-23 NOTE — Telephone Encounter (Signed)
Will need CBC and CMET in 2-3 weeks.  Belenda Cruise

## 2013-07-23 NOTE — Telephone Encounter (Signed)
[name] - ok to start pt on Eliquis 5mg  bid.  Will need

## 2013-08-08 ENCOUNTER — Ambulatory Visit (INDEPENDENT_AMBULATORY_CARE_PROVIDER_SITE_OTHER): Payer: Medicare Other | Admitting: Internal Medicine

## 2013-08-08 ENCOUNTER — Encounter: Payer: Self-pay | Admitting: Internal Medicine

## 2013-08-08 VITALS — BP 118/64 | HR 61 | Ht 59.0 in | Wt 252.0 lb

## 2013-08-08 DIAGNOSIS — R911 Solitary pulmonary nodule: Secondary | ICD-10-CM

## 2013-08-08 DIAGNOSIS — D869 Sarcoidosis, unspecified: Secondary | ICD-10-CM

## 2013-08-08 DIAGNOSIS — R112 Nausea with vomiting, unspecified: Secondary | ICD-10-CM

## 2013-08-08 DIAGNOSIS — R918 Other nonspecific abnormal finding of lung field: Secondary | ICD-10-CM

## 2013-08-08 NOTE — Patient Instructions (Addendum)
We can continue CPAP 9 an d O2 2L/ Advanced  Stop theophylline- we want to see if this helps the nausea and vomiting  Order- reschedule pending chest CT to next available routine scheduling.  Keep appointment in  November as planned

## 2013-08-08 NOTE — Progress Notes (Signed)
Patient ID: [name], female    DOB: [birth year], 72 y.o.   MRN: [id number]  HPI  06/13/11- 79 yoF never smoker, hx of OSA on CPAP Hx Sarcoid,, PAF-chronic coumadin , Morbid obesity   Last here December 21, 2010 - Note reviewed Blames heat for lack energy. She also had right breast lumpectomy and XRT in February. Now pending right knee replacement next week.  Got good report at cardiology last week, following for her AFIb. She had stopped ritalin as ineffective for complaints of tiredness before.  Continues CPAPat 9 cwp.   08/05/2011 Follow up  Pt presents today for follow up of O2. PT recently had R. TKR on 06/20/11 w/ rehab stay. She did have some desaturations post op and was started on o2 at 2l /m . Per pt she was discharged to rehab on o2. She had swelling in right lower leg and had venous doppler that was neg for DVT. She is on chronic coumadin for hx of a fib. She says she does wear out easily and has DOE. Does fine at rest w/ no dyspnea.  Wants to see if she can get off O2. Today in office O2 sat at rest was 94%. Walking O2 sat is 92% w/out desaturations.  Denies chest pain or hemoptysis. Weight is down 6 lbs since last ov.  No increased cough or congestion  Wearing CPAP each night  She was discharge home on 08/02/11 . No records from rehab available at todays visit. Will attempt to obtain.   09/22/11-69 yoF never smoker, hx of OSA on CPAP Hx Sarcoid,, PAF-chronic coumadin , Morbid obesity She is staying on her oxygen most of the time. Consider comfortably on room air for a while at rest is is it for sleep and exertion. She is having difficulty getting around with a walker and managing her oxygen tank. Her home care company says it does not have a small portable type that would work for her. She continues her CPAP every night/Advanced. Says she has lost 22 pounds since her knee replacement surgery.  1//3/12- - 69 yoF never smoker, hx of OSA on CPAP Hx Sarcoid,, PAF-chronic coumadin ,  Morbid obesity Still having SOB, slight wheezing at times. cough-productive-clear in color; denies any fever and chills She got portable oxygen/Advanced. Comfortable with her CPAP and using it every night 3 at Has felt sick since Thanksgiving. Now she just doesn't feel she can clear her airways of mucus. She took 2 rounds of antibiotics. Using her nebulizer machine.  05/04/12- 20 yoF never smoker, hx of OSA on CPAP Hx Sarcoid,, PAF-chronic coumadin , Morbid obesity Breathing is fine as along as using O2 as needed Uses oxygen at 2 L for sleep and when needed. She may need to be requalified. Occasional minor wheeze in the mornings does not bother her enough to use her rescue inhaler.  11/02/12- 44 yoF never smoker, hx of OSA on CPAP Hx Sarcoid,, PAF-chronic coumadin , Morbid obesity FOLLOWS FOR: SOB (mostly with activity) and wheezing; Still using O2 as needed Has had flu vaccine. She is pleased to report no issues at all with no recent colds or respiratory events. She is trying to lose some weight. Using oxygen 2L/ Advanced for sleep and if needed.  06/06/13-71 yoF never smoker, hx of OSA on CPAP Hx Sarcoid,, PAFib-chronic coumadin , Morbid obesity Review of Systems-see HPI FOLLOWS FOR: Post hospital; Continues to have SOB-worse with heat Hospitalized with aspiration pneumonia and then with acute GI  bleed. Now denies abdominal pain, nausea obvious blood loss. She was left on theophylline. Still feels "washed out and tired" CT chest 05/11/13- IMPRESSION:  1. No evidence pulmonary embolism.  2. Right greater than left lower lobe predominant airspace  disease, most consistent with infection.  3. Scattered pulmonary nodules. Nonspecific, especially given the  clinical history of sarcoidosis and breast cancer. Consider short-  term CT follow-up at 3 months to exclude metastasis.  4. Thoracic adenopathy, which could be reactive or due to  sarcoidosis. Metastatic disease cannot be excluded. This  could  also be reevaluated at follow-up.  5. Small hiatal hernia.  6. 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  Original Report Authenticated By: Jeronimo Greaves, M.D  08/08/13- 53 yoF never smoker, hx of OSA on CPAP Hx Sarcoid,, PAF-chronic coumadin , Morbid obesity FOLLOWS FOR: stay indoors as much as possible due to heat; denies any wheezing, SOB, cough, or congestion. Nausea and vomiting 2 days ago and again this morning, starting after she was up and around. No blood or pain. We think aspiration caused the pneumonia for which she was hospitalized earlier this year. Oxygen 2 L/Advanced CPAP 9  ROS: Constitutional:  No--weight loss, no-night sweats, fevers, chills, +fatigue, lassitude. HEENT:   No-  headaches, difficulty swallowing, tooth/dental problems, sore throat,       No-  sneezing, itching, ear ache, nasal congestion, post nasal drip,  CV:  No-   chest pain, orthopnea, PND, swelling in lower extremities, anasarca,  dizziness, palpitations Resp: + shortness of breath with exertion, not at rest.              No-   productive cough,  No non-productive cough,  No-  coughing up of blood.              No-   change in color of mucus.  No- wheezing.   Skin: No-   rash or lesions. GI:  No-   heartburn, indigestion, abdominal pain, +nausea, +vomiting, GU:  MS:  No-   joint pain or swelling.   Neuro-  nothing unusual Psych:  No- change in mood or affect. No depression or anxiety.  No memory loss.   Objective:   Physical Exam General- Alert, Oriented, Affect-appropriate, Distress- none acute, morbidly obese, using a walker and portable oxygen 2L Skin- looks pale Lymphadenopathy- none Head- atraumatic            Eyes- Gross vision intact, PERRLA, conjunctivae clear secretions            Ears- Hearing, canals-normal            Nose- Clear, no-Septal dev, mucus, polyps, erosion, perforation             Throat- Mallampati II , mucosa clear , drainage- none,  tonsils- atrophic Neck- flexible , trachea midline, no stridor , thyroid nl, carotid no bruit Chest - symmetrical excursion , unlabored           Heart/CV- nearly regular with occasional skipped , no murmur , no gallop  , no rub, nl s1 s2                           - JVD- none , 2+ edema bilaterally, stasis changes- none, varices- none           Lung-  Clear,+few crackles,  no- cough , dullness-none, rub- none  Chest wall-  Abd-  Br/ Gen/ Rectal- Not done, not indicated Extrem- cyanosis- none, clubbing, none, atrophy- none, strength- nl Neuro- grossly intact to observation

## 2013-08-17 LAB — COMPREHENSIVE METABOLIC PANEL
Albumin: 4.7 g/dL (ref 3.5–5.2)
CO2: 33 mEq/L — ABNORMAL HIGH (ref 19–32)
Calcium: 10.4 mg/dL (ref 8.4–10.5)
Glucose, Bld: 182 mg/dL — ABNORMAL HIGH (ref 70–99)
Potassium: 4.4 mEq/L (ref 3.5–5.3)
Sodium: 142 mEq/L (ref 135–145)
Total Protein: 6.6 g/dL (ref 6.0–8.3)

## 2013-08-17 LAB — CBC
HCT: 43 % (ref 36.0–46.0)
Hemoglobin: 13.1 g/dL (ref 12.0–15.0)
RBC: 5.67 MIL/uL — ABNORMAL HIGH (ref 3.87–5.11)

## 2013-08-18 ENCOUNTER — Inpatient Hospital Stay (HOSPITAL_COMMUNITY)
Admission: EM | Admit: 2013-08-18 | Discharge: 2013-08-26 | DRG: 600 | Disposition: A | Discharge status: Skilled Nursing Facility | Payer: Medicare Other | Attending: Internal Medicine | Admitting: Internal Medicine

## 2013-08-18 ENCOUNTER — Emergency Department (HOSPITAL_COMMUNITY): Payer: Medicare Other

## 2013-08-18 ENCOUNTER — Encounter (HOSPITAL_COMMUNITY): Payer: Self-pay | Admitting: Emergency Medicine

## 2013-08-18 DIAGNOSIS — J069 Acute upper respiratory infection, unspecified: Secondary | ICD-10-CM

## 2013-08-18 DIAGNOSIS — G4733 Obstructive sleep apnea (adult) (pediatric): Secondary | ICD-10-CM

## 2013-08-18 DIAGNOSIS — C50311 Malignant neoplasm of lower-inner quadrant of right female breast: Secondary | ICD-10-CM | POA: Diagnosis present

## 2013-08-18 DIAGNOSIS — R55 Syncope and collapse: Secondary | ICD-10-CM

## 2013-08-18 DIAGNOSIS — Z853 Personal history of malignant neoplasm of breast: Secondary | ICD-10-CM

## 2013-08-18 DIAGNOSIS — C50911 Malignant neoplasm of unspecified site of right female breast: Secondary | ICD-10-CM

## 2013-08-18 DIAGNOSIS — Z91041 Radiographic dye allergy status: Secondary | ICD-10-CM

## 2013-08-18 DIAGNOSIS — J40 Bronchitis, not specified as acute or chronic: Secondary | ICD-10-CM

## 2013-08-18 DIAGNOSIS — Z7901 Long term (current) use of anticoagulants: Secondary | ICD-10-CM

## 2013-08-18 DIAGNOSIS — R0902 Hypoxemia: Secondary | ICD-10-CM | POA: Diagnosis present

## 2013-08-18 DIAGNOSIS — N179 Acute kidney failure, unspecified: Secondary | ICD-10-CM

## 2013-08-18 DIAGNOSIS — D72829 Elevated white blood cell count, unspecified: Secondary | ICD-10-CM

## 2013-08-18 DIAGNOSIS — R609 Edema, unspecified: Secondary | ICD-10-CM

## 2013-08-18 DIAGNOSIS — R911 Solitary pulmonary nodule: Secondary | ICD-10-CM

## 2013-08-18 DIAGNOSIS — D869 Sarcoidosis, unspecified: Secondary | ICD-10-CM

## 2013-08-18 DIAGNOSIS — Z923 Personal history of irradiation: Secondary | ICD-10-CM

## 2013-08-18 DIAGNOSIS — E785 Hyperlipidemia, unspecified: Secondary | ICD-10-CM

## 2013-08-18 DIAGNOSIS — Z794 Long term (current) use of insulin: Secondary | ICD-10-CM

## 2013-08-18 DIAGNOSIS — M109 Gout, unspecified: Secondary | ICD-10-CM

## 2013-08-18 DIAGNOSIS — J9611 Chronic respiratory failure with hypoxia: Secondary | ICD-10-CM | POA: Diagnosis present

## 2013-08-18 DIAGNOSIS — D649 Anemia, unspecified: Secondary | ICD-10-CM

## 2013-08-18 DIAGNOSIS — N61 Mastitis without abscess: Principal | ICD-10-CM

## 2013-08-18 DIAGNOSIS — J9691 Respiratory failure, unspecified with hypoxia: Secondary | ICD-10-CM

## 2013-08-18 DIAGNOSIS — E119 Type 2 diabetes mellitus without complications: Secondary | ICD-10-CM

## 2013-08-18 DIAGNOSIS — E871 Hypo-osmolality and hyponatremia: Secondary | ICD-10-CM | POA: Diagnosis present

## 2013-08-18 DIAGNOSIS — Z23 Encounter for immunization: Secondary | ICD-10-CM

## 2013-08-18 DIAGNOSIS — D696 Thrombocytopenia, unspecified: Secondary | ICD-10-CM | POA: Diagnosis present

## 2013-08-18 DIAGNOSIS — J690 Pneumonitis due to inhalation of food and vomit: Secondary | ICD-10-CM

## 2013-08-18 DIAGNOSIS — Z800 Family history of malignant neoplasm of digestive organs: Secondary | ICD-10-CM

## 2013-08-18 DIAGNOSIS — E662 Morbid (severe) obesity with alveolar hypoventilation: Secondary | ICD-10-CM

## 2013-08-18 DIAGNOSIS — R531 Weakness: Secondary | ICD-10-CM

## 2013-08-18 DIAGNOSIS — R112 Nausea with vomiting, unspecified: Secondary | ICD-10-CM | POA: Insufficient documentation

## 2013-08-18 DIAGNOSIS — J9610 Chronic respiratory failure, unspecified whether with hypoxia or hypercapnia: Secondary | ICD-10-CM | POA: Diagnosis present

## 2013-08-18 DIAGNOSIS — J449 Chronic obstructive pulmonary disease, unspecified: Secondary | ICD-10-CM

## 2013-08-18 DIAGNOSIS — Z90710 Acquired absence of both cervix and uterus: Secondary | ICD-10-CM

## 2013-08-18 DIAGNOSIS — E876 Hypokalemia: Secondary | ICD-10-CM | POA: Diagnosis not present

## 2013-08-18 DIAGNOSIS — Z8542 Personal history of malignant neoplasm of other parts of uterus: Secondary | ICD-10-CM

## 2013-08-18 DIAGNOSIS — R600 Localized edema: Secondary | ICD-10-CM

## 2013-08-18 DIAGNOSIS — Z96659 Presence of unspecified artificial knee joint: Secondary | ICD-10-CM

## 2013-08-18 DIAGNOSIS — Z9981 Dependence on supplemental oxygen: Secondary | ICD-10-CM

## 2013-08-18 DIAGNOSIS — I4891 Unspecified atrial fibrillation: Secondary | ICD-10-CM

## 2013-08-18 DIAGNOSIS — K922 Gastrointestinal hemorrhage, unspecified: Secondary | ICD-10-CM

## 2013-08-18 DIAGNOSIS — Z6841 Body Mass Index (BMI) 40.0 and over, adult: Secondary | ICD-10-CM

## 2013-08-18 DIAGNOSIS — J441 Chronic obstructive pulmonary disease with (acute) exacerbation: Secondary | ICD-10-CM

## 2013-08-18 DIAGNOSIS — Z886 Allergy status to analgesic agent status: Secondary | ICD-10-CM

## 2013-08-18 DIAGNOSIS — Z79899 Other long term (current) drug therapy: Secondary | ICD-10-CM

## 2013-08-18 DIAGNOSIS — Z888 Allergy status to other drugs, medicaments and biological substances status: Secondary | ICD-10-CM

## 2013-08-18 DIAGNOSIS — Z833 Family history of diabetes mellitus: Secondary | ICD-10-CM

## 2013-08-18 DIAGNOSIS — J4489 Other specified chronic obstructive pulmonary disease: Secondary | ICD-10-CM | POA: Diagnosis present

## 2013-08-18 LAB — COMPREHENSIVE METABOLIC PANEL
ALT: 8 U/L (ref 0–35)
Alkaline Phosphatase: 58 U/L (ref 39–117)
CO2: 29 mEq/L (ref 19–32)
GFR calc Af Amer: >90 mL/min (ref >90)
Glucose, Bld: 194 mg/dL — ABNORMAL HIGH (ref 70–99)
Potassium: 3.6 mEq/L (ref 3.5–5.1)
Sodium: 137 mEq/L (ref 135–145)
Total Protein: 6.9 g/dL (ref 6.0–8.3)

## 2013-08-18 LAB — URINALYSIS, ROUTINE W REFLEX MICROSCOPIC
Bilirubin Urine: NEGATIVE
Glucose, UA: NEGATIVE mg/dL
Ketones, ur: NEGATIVE mg/dL
Nitrite: NEGATIVE
pH: 7 (ref 5.0–8.0)

## 2013-08-18 LAB — CBC WITH DIFFERENTIAL/PLATELET
Eosinophils Absolute: 0 10*3/uL (ref 0.0–0.7)
Lymphocytes Relative: 3 % — ABNORMAL LOW (ref 12–46)
Lymphs Abs: 0.5 10*3/uL — ABNORMAL LOW (ref 0.7–4.0)
Neutro Abs: 15.4 10*3/uL — ABNORMAL HIGH (ref 1.7–7.7)
Neutrophils Relative %: 91 % — ABNORMAL HIGH (ref 43–77)
Platelets: 138 10*3/uL — ABNORMAL LOW (ref 150–400)
RBC: 5.59 MIL/uL — ABNORMAL HIGH (ref 3.87–5.11)
WBC: 16.9 10*3/uL — ABNORMAL HIGH (ref 4.0–10.5)

## 2013-08-18 LAB — URINE MICROSCOPIC-ADD ON

## 2013-08-18 MED ORDER — DEXTROSE 5 % IV SOLN
1.0000 g | Freq: Once | INTRAVENOUS | Status: AC
  Administered 2013-08-19: 1 g via INTRAVENOUS
  Filled 2013-08-18: qty 10

## 2013-08-18 MED ORDER — VANCOMYCIN HCL IN DEXTROSE 1-5 GM/200ML-% IV SOLN
1000.0000 mg | Freq: Once | INTRAVENOUS | Status: AC
  Administered 2013-08-19: 1000 mg via INTRAVENOUS
  Filled 2013-08-18: qty 200

## 2013-08-18 MED ORDER — METOPROLOL TARTRATE 25 MG PO TABS
25.0000 mg | ORAL_TABLET | Freq: Once | ORAL | Status: AC
  Administered 2013-08-18: 25 mg via ORAL
  Filled 2013-08-18: qty 1

## 2013-08-18 NOTE — ED Notes (Signed)
Pt started experiencing posterior neck pain. RN repositioned pt in bed and pt stated that it helped some. MD notified.

## 2013-08-18 NOTE — Assessment & Plan Note (Signed)
Plan followup chest CT 

## 2013-08-18 NOTE — ED Provider Notes (Signed)
CSN: [id number]     Arrival date & time 08/18/13  2049 History   First MD Initiated Contact with Patient 08/18/13 2050     Chief Complaint  Patient presents with  . Transient Ischemic Attack   (Consider location/radiation/quality/duration/timing/severity/associated sxs/prior Treatment) HPI 72 year old female with past medical history of diabetes, COPD (2 L oxygen at baseline),  morbid obesity, sarcoid, A. fib (eliquis,dilt, metoprolol) with a recent hospitalization due to vaginal hemorrhage in the setting of supratherapeutic INR who presents tonight with a complaint of generalized weakness. The patient lives at home and usually takes care of herself. She uses a walker to ambulate. Her neighbors found her today in her chair. She reportedly had been sitting in his chair for several hours and states that she was unable to rise from the chair. This is a new issue for her. EMS was called by the neighbors. There were some reports of possible left-sided facial droop. The patient denies any other symptoms at that time including focal weakness, slurred speech, numbness, chest pain, shortness of breath, vision changes, vertigo, headache. On arrival she reports that she feels like her normal self and is asking if she can go home. No reported fevers, vomiting, diarrhea or abdominal pain.  Past Medical History  Diagnosis Date  . Diabetes mellitus   . Asthma   . Bronchitis   . Hernia   . Anemia   . Uterine cancer     s/p hysterectomy  . Arthritis   . Hernia   . Morbid obesity   . Sarcoid   . OSA (obstructive sleep apnea)     Uses nasal CPAP Q HS  . Atrial fibrillation   . Breast cancer, stage 1 10/17/2011    s/p lumpectomy and XRT  . Cough   . Wheezing   . Chills   . Constipation   . Bruises easily   . Gout attack     ankle, then wrist and hands  . Hyperlipidemia    Past Surgical History  Procedure Laterality Date  . Total knee arthroplasty  2001  . Abdominal hysterectomy  2006  . Resection  of uterine cancer    . Left lower leg fx--casted    . Breast lumpectomy Right    Family History  Problem Relation Age of Onset  . Diabetes Mother   . Cancer Father     colon  . Diabetes Brother   . Cancer Sister   . Diabetes Sister    History  Substance Use Topics  . Smoking status: Never Smoker   . Smokeless tobacco: Never Used  . Alcohol Use: No   OB History   Grav Para Term Preterm Abortions TAB SAB Ect Mult Living                 Review of Systems  Constitutional: Negative for fever and chills.  HENT: Negative for congestion, trouble swallowing, neck pain and neck stiffness.   Respiratory: Negative for cough and shortness of breath.   Cardiovascular: Negative for chest pain and palpitations.  Gastrointestinal: Negative for nausea, vomiting, abdominal pain and diarrhea.  Endocrine: Negative for polydipsia and polyphagia.  Genitourinary: Negative for dysuria, frequency and vaginal bleeding.  Musculoskeletal: Negative for back pain and joint swelling.  Skin: Negative for rash.  Hematological: Negative for adenopathy. Does not bruise/bleed easily.  All other systems reviewed and are negative.    Allergies  Aspirin; Contrast media; Ibuprofen; Pravachol; and Pravastatin sodium  Home Medications   Current Outpatient Rx  Name  Route  Sig  Dispense  Refill  . acetaminophen (TYLENOL) 500 MG tablet   Oral   Take 1,000 mg by mouth every 6 (six) hours as needed. pain         . albuterol (PROVENTIL HFA) 108 (90 BASE) MCG/ACT inhaler   Inhalation   Inhale 2 puffs into the lungs every 6 (six) hours as needed for wheezing.         Marland Kitchen allopurinol (ZYLOPRIM) 100 MG tablet   Oral   Take 200 mg by mouth daily.          Marland Kitchen apixaban (ELIQUIS) 5 MG TABS tablet   Oral   Take 1 tablet (5 mg total) by mouth 2 (two) times daily.   60 tablet   3   . Ascorbic Acid (VITAMIN C) 1000 MG tablet   Oral   Take 1,000 mg by mouth daily.         . chlorpheniramine-HYDROcodone  (TUSSIONEX) 10-8 MG/5ML LQCR   Oral   Take 5 mLs by mouth every 12 (twelve) hours as needed.   115 mL   0   . colchicine 0.6 MG tablet   Oral   Take 1 tablet (0.6 mg total) by mouth 2 (two) times daily.   60 tablet   0   . diltiazem (TIAZAC) 360 MG 24 hr capsule   Oral   Take 1 tablet by mouth daily.          Marland Kitchen exemestane (AROMASIN) 25 MG tablet   Oral   Take 1 tablet (25 mg total) by mouth daily after breakfast.   30 tablet   12   . ferrous fumarate (HEMOCYTE - 106 MG FE) 325 (106 FE) MG TABS   Oral   Take 1 tablet by mouth 3 (three) times daily.         . fexofenadine (ALLEGRA) 180 MG tablet   Oral   Take 180 mg by mouth daily.         . Fluticasone-Salmeterol (ADVAIR) 250-50 MCG/DOSE AEPB   Inhalation   Inhale 1 puff into the lungs every 12 (twelve) hours.         . furosemide (LASIX) 20 MG tablet   Oral   Take 1 tablet by mouth daily.         Marland Kitchen HUMULIN N 100 UNIT/ML injection   Subcutaneous   Inject 5 Units into the skin as needed (5 units if blood sugar is greater than 150).          Marland Kitchen HYDROcodone-acetaminophen (NORCO/VICODIN) 5-325 MG per tablet   Oral   Take 1 tablet by mouth every 6 (six) hours as needed. For pain.   30 tablet   0   . insulin glargine (LANTUS) 100 UNIT/ML injection   Subcutaneous   Inject 0.28 mLs (28 Units total) into the skin at bedtime.   10 mL   12   . ipratropium (ATROVENT) 0.02 % nebulizer solution   Nebulization   Take 2.5 mLs (0.5 mg total) by nebulization every 6 (six) hours.   75 mL   0   . levalbuterol (XOPENEX) 0.63 MG/3ML nebulizer solution   Nebulization   Take 3 mLs (0.63 mg total) by nebulization every 6 (six) hours.   3 mL   0   . LORazepam (ATIVAN) 0.5 MG tablet   Oral   Take 0.5 mg by mouth at bedtime as needed. For sleep.         . meclizine (ANTIVERT) 25  MG tablet   Oral   Take 6.25-12.5 mg by mouth 3 (three) times daily as needed. For dizziness.         . metFORMIN (GLUCOPHAGE) 500  MG tablet   Oral   Take 500 mg by mouth 2 (two) times daily with a meal. If cbg is greater than 120 pt takes medication         . metoprolol tartrate (LOPRESSOR) 25 MG tablet   Oral   Take 25 mg by mouth 2 (two) times daily.          . Multiple Vitamin (MULTIVITAMIN WITH MINERALS) TABS   Oral   Take 1 tablet by mouth daily.         . ondansetron (ZOFRAN) 4 MG tablet   Oral   Take 4 mg by mouth every 8 (eight) hours as needed for nausea.         . simvastatin (ZOCOR) 40 MG tablet   Oral   Take 40 mg by mouth at bedtime.         . theophylline (THEODUR) 200 MG 12 hr tablet   Oral   Take 1 tablet by mouth daily.          BP 124/76  Pulse 104  Temp(Src) 99.3 F (37.4 C) (Oral)  SpO2 96% Physical Exam  Vitals reviewed. Constitutional: She is oriented to person, place, and time. She appears well-developed and well-nourished. No distress.  Morbidly obese  HENT:  Right Ear: External ear normal.  Left Ear: External ear normal.  Mouth/Throat: No oropharyngeal exudate.  Eyes: Conjunctivae and EOM are normal. Pupils are equal, round, and reactive to light.  Neck: Normal range of motion. Neck supple.  Cardiovascular: Normal rate, regular rhythm, normal heart sounds and intact distal pulses.  Exam reveals no gallop and no friction rub.   No murmur heard. Pulmonary/Chest: Effort normal and breath sounds normal.  Right breast erythema and warmth, no fluctuant masses  Abdominal: Soft. Bowel sounds are normal. She exhibits no distension. There is no tenderness.  Musculoskeletal: Normal range of motion. She exhibits no edema.  Neurological: She is alert and oriented to person, place, and time. She has normal strength. No cranial nerve deficit or sensory deficit.  Moves all 4 extremities with gross 5/5 strength  Gross sensation intact in all extremities distally No pronator drift. No appreciable facial droop. Finger to nose was intact  Skin: Skin is warm and dry. No rash  noted.  Psychiatric: She has a normal mood and affect.    ED Course  Procedures (including critical care time) Labs Review Labs Reviewed  CBC WITH DIFFERENTIAL - Abnormal; Notable for the following:    WBC 16.9 (*)    RBC 5.59 (*)    MCV 75.8 (*)    MCH 24.5 (*)    RDW 17.9 (*)    Platelets 138 (*)    Neutrophils Relative % 91 (*)    Neutro Abs 15.4 (*)    Lymphocytes Relative 3 (*)    Lymphs Abs 0.5 (*)    All other components within normal limits  COMPREHENSIVE METABOLIC PANEL - Abnormal; Notable for the following:    Glucose, Bld 194 (*)    Calcium 10.8 (*)    GFR calc non Af Amer 81 (*)    All other components within normal limits  URINALYSIS, ROUTINE W REFLEX MICROSCOPIC - Abnormal; Notable for the following:    APPearance CLOUDY (*)    Leukocytes, UA TRACE (*)  All other components within normal limits  URINE MICROSCOPIC-ADD ON - Abnormal; Notable for the following:    Squamous Epithelial / LPF MANY (*)    All other components within normal limits  CK  LACTIC ACID, PLASMA   Imaging Review Dg Chest Portable 1 View  08/18/2013   *RADIOLOGY REPORT*  Clinical Data: TIA  PORTABLE CHEST - 1 VIEW  Comparison: Chest radiograph 06/06/2013  Findings: Stable mildly enlarged heart silhouette.  Chronic bronchitic change present.  No effusion, infiltrate, or pneumothorax.  Mild basilar atelectasis.  IMPRESSION: No acute cardiopulmonary process.   Original Report Authenticated By: Genevive Bi, M.D.    Date: 08/18/2013  Rate: 104  Rhythm: atrial fibrillation  QRS Axis: normal  Intervals: normal  ST/T Wave abnormalities: nonspecific T wave changes  Conduction Disutrbances:none  Narrative Interpretation: A fib with tachycardic response, NSTWA  Old EKG Reviewed: rate increased from EKG 52314, otherwise no significant change  MDM   72 year old female with multiple comorbidities as documented above that presents with generalized weakness and questionable left-sided facial  droop. Patient denies any other associated symptoms. Review of systems was unrevealing. She is morbidly obese. She is afebrile on oral temperature here. She is alert and fully oriented. Vital signs are stable. She has a nonfocal neurologic exam although she appears to be weak overall. There is no appreciable facial droop on my exam. Her son agrees. Lungs are clear. Abdomen is soft, nontender.  Differential diagnosis: UTI, pneumonia, viral illness, ARF, anemia, TIA.  Given the uncertainty of her presentation and exam findings of generalized weakness and do not feel that this was an acute neurologic event. She had a large stroke workup in 2013.She is on eliquis. She would likely benefit from antiplatelet therapy. I will evaluate this patient with urine studies, CBC, CMP, chest x-ray, troponin, Lactate, EKG.  11:33 PM Workup wasonly remarkable for a leukocytosis, which is new from her prior testing. Chest x-ray was without acute findings.  Troponin was undetectable. CMP was normal. Urine studies appear contaminated and do not appear to be consistent with a UTI. Attempts to ambulate the patient were unsuccessful. Patient reports that she just feels too weak.  11:51 PM Borderline oral temp, leukocytosis and weakness prompted search for possible sources of infection. She has findings consistent with a right breast cellulitis. Vancomycin and Rocephin were ordered. Medicine consultation for admission.  Clinical Impression: 1. Cellulitis of breast   2. Generalized weakness   3. Leukocytosis     Disposition: Admit  Condition: Fair   I have discussed the results, Dx and Tx plan. They understand and agree with plan for admission.  Exam unchanged at admission.   Pt seen in conjunction with Dr. Patria Mane.  Reine Just. Beverely Pace, MD Emergency Medicine PGY-III [phone]   Oleh Genin, MD 08/19/13 [phone]

## 2013-08-18 NOTE — ED Notes (Signed)
Per EMS, neighbors called EMS b/c they thought pt was "different from normal." Neighbors were reporting left sided weakness, but pt states that is her baseline. EMS stroke screen was negative for stroke symptoms at this time. Pt has no complaints at this time. CBG 164. Pt is a/o x4.

## 2013-08-18 NOTE — Assessment & Plan Note (Signed)
Nonspecific. This could be anything, including diabetic enteropathy. Question if it is side effect of her theophylline. Plan- D/C theophylline. She will see her other physicians.

## 2013-08-18 NOTE — Assessment & Plan Note (Signed)
Probably in remission

## 2013-08-19 ENCOUNTER — Encounter (HOSPITAL_COMMUNITY): Payer: Self-pay | Admitting: *Deleted

## 2013-08-19 ENCOUNTER — Inpatient Hospital Stay (HOSPITAL_COMMUNITY): Payer: Medicare Other

## 2013-08-19 DIAGNOSIS — R531 Weakness: Secondary | ICD-10-CM | POA: Diagnosis present

## 2013-08-19 DIAGNOSIS — J9611 Chronic respiratory failure with hypoxia: Secondary | ICD-10-CM | POA: Diagnosis present

## 2013-08-19 DIAGNOSIS — R5381 Other malaise: Secondary | ICD-10-CM

## 2013-08-19 DIAGNOSIS — D869 Sarcoidosis, unspecified: Secondary | ICD-10-CM

## 2013-08-19 DIAGNOSIS — I4891 Unspecified atrial fibrillation: Secondary | ICD-10-CM

## 2013-08-19 DIAGNOSIS — N61 Mastitis without abscess: Principal | ICD-10-CM

## 2013-08-19 DIAGNOSIS — J449 Chronic obstructive pulmonary disease, unspecified: Secondary | ICD-10-CM | POA: Diagnosis present

## 2013-08-19 DIAGNOSIS — J441 Chronic obstructive pulmonary disease with (acute) exacerbation: Secondary | ICD-10-CM | POA: Diagnosis present

## 2013-08-19 DIAGNOSIS — C50919 Malignant neoplasm of unspecified site of unspecified female breast: Secondary | ICD-10-CM

## 2013-08-19 DIAGNOSIS — J9600 Acute respiratory failure, unspecified whether with hypoxia or hypercapnia: Secondary | ICD-10-CM

## 2013-08-19 LAB — CBC
MCH: 23.7 pg — ABNORMAL LOW (ref 26.0–34.0)
MCHC: 30.7 g/dL (ref 30.0–36.0)
MCV: 77.2 fL — ABNORMAL LOW (ref 78.0–100.0)
Platelets: 127 10*3/uL — ABNORMAL LOW (ref 150–400)
RBC: 5.14 MIL/uL — ABNORMAL HIGH (ref 3.87–5.11)
RDW: 18.4 % — ABNORMAL HIGH (ref 11.5–15.5)

## 2013-08-19 LAB — GLUCOSE, CAPILLARY
Glucose-Capillary: 157 mg/dL — ABNORMAL HIGH (ref 70–99)
Glucose-Capillary: 172 mg/dL — ABNORMAL HIGH (ref 70–99)

## 2013-08-19 LAB — BASIC METABOLIC PANEL
BUN: 16 mg/dL (ref 6–23)
Calcium: 9.9 mg/dL (ref 8.4–10.5)
Creatinine, Ser: 0.82 mg/dL (ref 0.50–1.10)
GFR calc non Af Amer: 69 mL/min — ABNORMAL LOW (ref >90)
Glucose, Bld: 236 mg/dL — ABNORMAL HIGH (ref 70–99)
Sodium: 135 mEq/L (ref 135–145)

## 2013-08-19 MED ORDER — SODIUM CHLORIDE 0.9 % IV SOLN: 250.0000 mL | INTRAVENOUS | Status: DC | PRN

## 2013-08-19 MED ORDER — ONDANSETRON HCL 4 MG PO TABS: 4.0000 mg | ORAL_TABLET | Freq: Four times a day (QID) | ORAL | Status: DC | PRN

## 2013-08-19 MED ORDER — MOMETASONE FURO-FORMOTEROL FUM 100-5 MCG/ACT IN AERO
2.0000 | INHALATION_SPRAY | Freq: Two times a day (BID) | RESPIRATORY_TRACT | Status: DC
  Administered 2013-08-20 – 2013-08-26 (×10): 2 via RESPIRATORY_TRACT
  Filled 2013-08-19 (×3): qty 8.8

## 2013-08-19 MED ORDER — FUROSEMIDE 20 MG PO TABS
20.0000 mg | ORAL_TABLET | Freq: Every morning | ORAL | Status: DC
  Filled 2013-08-19: qty 1

## 2013-08-19 MED ORDER — VANCOMYCIN HCL IN DEXTROSE 1-5 GM/200ML-% IV SOLN
1000.0000 mg | Freq: Two times a day (BID) | INTRAVENOUS | Status: DC
  Administered 2013-08-19 – 2013-08-22 (×8): 1000 mg via INTRAVENOUS
  Filled 2013-08-19 (×9): qty 200

## 2013-08-19 MED ORDER — SODIUM CHLORIDE 0.9 % IJ SOLN
3.0000 mL | Freq: Two times a day (BID) | INTRAMUSCULAR | Status: DC
  Administered 2013-08-19 – 2013-08-26 (×10): 3 mL via INTRAVENOUS

## 2013-08-19 MED ORDER — ACETAMINOPHEN 325 MG PO TABS
650.0000 mg | ORAL_TABLET | Freq: Four times a day (QID) | ORAL | Status: DC | PRN
  Administered 2013-08-19 – 2013-08-26 (×15): 650 mg via ORAL
  Filled 2013-08-19 (×16): qty 2

## 2013-08-19 MED ORDER — SODIUM CHLORIDE 0.9 % IJ SOLN
3.0000 mL | INTRAMUSCULAR | Status: DC | PRN
  Administered 2013-08-23: 3 mL via INTRAVENOUS

## 2013-08-19 MED ORDER — METOPROLOL TARTRATE 25 MG PO TABS
25.0000 mg | ORAL_TABLET | Freq: Two times a day (BID) | ORAL | Status: DC
  Administered 2013-08-19 – 2013-08-26 (×14): 25 mg via ORAL
  Filled 2013-08-19 (×18): qty 1

## 2013-08-19 MED ORDER — SODIUM CHLORIDE 0.9 % IV SOLN
Freq: Once | INTRAVENOUS | Status: AC
Start: 2013-08-19 — End: 2013-08-19
  Administered 2013-08-19: via INTRAVENOUS

## 2013-08-19 MED ORDER — ALLOPURINOL 100 MG PO TABS
200.0000 mg | ORAL_TABLET | Freq: Every day | ORAL | Status: DC
  Administered 2013-08-19: 100 mg via ORAL
  Administered 2013-08-20 – 2013-08-26 (×7): 200 mg via ORAL
  Filled 2013-08-19 (×9): qty 2

## 2013-08-19 MED ORDER — ACETAMINOPHEN 325 MG PO TABS
650.0000 mg | ORAL_TABLET | Freq: Once | ORAL | Status: AC
  Administered 2013-08-19: 650 mg via ORAL

## 2013-08-19 MED ORDER — ONDANSETRON HCL 4 MG/2ML IJ SOLN: 4.0000 mg | Freq: Four times a day (QID) | INTRAMUSCULAR | Status: DC | PRN

## 2013-08-19 MED ORDER — APIXABAN 5 MG PO TABS
5.0000 mg | ORAL_TABLET | Freq: Two times a day (BID) | ORAL | Status: DC
  Administered 2013-08-19 – 2013-08-26 (×15): 5 mg via ORAL
  Filled 2013-08-19 (×16): qty 1

## 2013-08-19 MED ORDER — INSULIN ASPART 100 UNIT/ML ~~LOC~~ SOLN
0.0000 [IU] | Freq: Three times a day (TID) | SUBCUTANEOUS | Status: DC
  Administered 2013-08-19: 1 [IU] via SUBCUTANEOUS
  Administered 2013-08-19: 2 [IU] via SUBCUTANEOUS
  Administered 2013-08-19 – 2013-08-20 (×2): 1 [IU] via SUBCUTANEOUS
  Administered 2013-08-20 (×2): 3 [IU] via SUBCUTANEOUS
  Administered 2013-08-21: 1 [IU] via SUBCUTANEOUS
  Administered 2013-08-21: 2 [IU] via SUBCUTANEOUS
  Administered 2013-08-21: 1 [IU] via SUBCUTANEOUS
  Administered 2013-08-22: 3 [IU] via SUBCUTANEOUS
  Administered 2013-08-22 – 2013-08-23 (×4): 1 [IU] via SUBCUTANEOUS
  Administered 2013-08-23: 3 [IU] via SUBCUTANEOUS
  Administered 2013-08-24 (×2): 1 [IU] via SUBCUTANEOUS
  Administered 2013-08-24 – 2013-08-25 (×2): 2 [IU] via SUBCUTANEOUS
  Administered 2013-08-25: 3 [IU] via SUBCUTANEOUS
  Administered 2013-08-25 – 2013-08-26 (×3): 1 [IU] via SUBCUTANEOUS

## 2013-08-19 MED ORDER — LORAZEPAM 0.5 MG PO TABS
0.5000 mg | ORAL_TABLET | Freq: Once | ORAL | Status: AC
  Administered 2013-08-19: 0.5 mg via ORAL
  Filled 2013-08-19: qty 1

## 2013-08-19 MED ORDER — CEFTRIAXONE SODIUM 1 G IJ SOLR
1.0000 g | INTRAMUSCULAR | Status: DC
  Filled 2013-08-19: qty 10

## 2013-08-19 MED ORDER — SODIUM CHLORIDE 0.9 % IV SOLN
INTRAVENOUS | Status: DC
  Administered 2013-08-19 (×2): via INTRAVENOUS

## 2013-08-19 MED ORDER — ACETAMINOPHEN 650 MG RE SUPP: 650.0000 mg | Freq: Four times a day (QID) | RECTAL | Status: DC | PRN

## 2013-08-19 MED ORDER — INSULIN GLARGINE 100 UNIT/ML ~~LOC~~ SOLN
15.0000 [IU] | Freq: Every day | SUBCUTANEOUS | Status: DC
  Administered 2013-08-19 – 2013-08-25 (×7): 15 [IU] via SUBCUTANEOUS
  Filled 2013-08-19 (×8): qty 0.15

## 2013-08-19 MED ORDER — ALBUTEROL SULFATE HFA 108 (90 BASE) MCG/ACT IN AERS
2.0000 | INHALATION_SPRAY | Freq: Four times a day (QID) | RESPIRATORY_TRACT | Status: DC | PRN
  Filled 2013-08-19: qty 6.7

## 2013-08-19 NOTE — ED Provider Notes (Signed)
I saw and evaluated the patient, reviewed the resident's note and I agree with the findings and plan.  I personally evaluated the ECG and agree with the interpretation of the resident  Patient appears to have a right breast cellulitis.  Antibiotics.  Blood cultures.  Rectal temperature is 102.2.  This is the cause of her generalized weakness.  Admission to the hospital.  Lyanne Co, MD 08/19/13 [phone]

## 2013-08-19 NOTE — Progress Notes (Signed)
[facility] PROGRESS NOTE  [name] ZOX:[id number] DOB: January 04, 1941 DOA: 08/18/2013 PCP: [name], MD  Assessment/Plan: 1- Cellulitis of breast right; Continue with Vancomycin day 1. Might be able to do [place] inpatient. Patient will need to follow up with oncologist and will need Mammogram and follow up at breast center. WBC trending down.   2-Atrial fibrillation: continue with metoprolol and Eliquis. SBP soft hold Cardizem.   3-Breast cancer, stage 1 s/p rt lumpectomy and radiation 2012: needs follow up with primary oncologist.  4-COPD (chronic obstructive pulmonary disease): appears stable. Will resume albuterol PRN and Advair.   5-Diabetes: SSI, resume lower dose lantus.  6-Hyponatremia: probably secondary to decrease volume. Will start IV fluids. Hold diuretics.  7-Chronic respiratory failure with hypoxia  8-Weakness generalized: secondary probably to acute illness. PT ordered.    Code Status: Full Code.  Family Communication: Care discussed with patient.  Disposition Plan: to be determine. PT, OT consult.    Consultants:  None.   Procedures:  None   Antibiotics:  Ceftriaxone 9-7// 9-8  Vancomycin  9-7  HPI/Subjective: Still feeling weak, tired. No significant change in redness right breast.   Objective: Filed Vitals:   08/19/13 0510  BP: 99/55  Pulse: 73  Temp: 98.7 F (37.1 C)  Resp: 20    Intake/Output Summary (Last 24 hours) at 08/19/13 1324 Last data filed at 08/19/13 0106  Gross per 24 hour  Intake     50 ml  Output      0 ml  Net     50 ml   Filed Weights   08/19/13 0200  Weight: 109.9 kg (242 lb 4.6 oz)    Exam:   General:  No distress.   Cardiovascular: S 1, S 2 RRR  Respiratory: CTA  Abdomen: BS present, soft, NT  Musculoskeletal: no edema.  Skin: right breast with redness, warm.   Data Reviewed: Basic Metabolic Panel:  Recent Labs Lab 08/16/13 1424 08/18/13 2145 08/19/13 0510  NA 142 137 135  K 4.4 3.6  3.3*  CL 101 96 95*  CO2 33* 29 29  GLUCOSE 182* 194* 236*  BUN 18 16 16   CREATININE 0.84 0.78 0.82  CALCIUM 10.4 10.8* 9.9   Liver Function Tests:  Recent Labs Lab 08/16/13 1424 08/18/13 2145  AST 15 15  ALT <8 8  ALKPHOS 67 58  BILITOT 0.8 1.0  PROT 6.6 6.9  ALBUMIN 4.7 4.0   No results found for this basename: LIPASE, AMYLASE,  in the last 168 hours No results found for this basename: AMMONIA,  in the last 168 hours CBC:  Recent Labs Lab 08/16/13 1424 08/18/13 2145 08/19/13 0510  WBC 6.0 16.9* 12.3*  NEUTROABS  --  15.4*  --   HGB 13.1 13.7 12.2  HCT 43.0 42.4 39.7  MCV 75.8* 75.8* 77.2*  PLT 221 138* 127*   Cardiac Enzymes:  Recent Labs Lab 08/18/13 2145  CKTOTAL 33   BNP (last 3 results)  Recent Labs  05/05/13 0519 05/06/13 0515 05/11/13 0450  PROBNP 3589.0* 3322.0* 1374.0*   CBG:  Recent Labs Lab 08/19/13 0748 08/19/13 1200  GLUCAP 157* 131*    No results found for this or any previous visit (from the past 240 hour(s)).   Studies: Dg Chest Portable 1 View  08/18/2013   *RADIOLOGY REPORT*  Clinical Data: TIA  PORTABLE CHEST - 1 VIEW  Comparison: Chest radiograph 06/06/2013  Findings: Stable mildly enlarged heart silhouette.  Chronic bronchitic change present.  No  effusion, infiltrate, or pneumothorax.  Mild basilar atelectasis.  IMPRESSION: No acute cardiopulmonary process.   Original Report Authenticated By: Genevive Bi, M.D.    Scheduled Meds: . apixaban  5 mg Oral BID  . cefTRIAXone (ROCEPHIN)  IV  1 g Intravenous Q24H  . insulin aspart  0-9 Units Subcutaneous TID WC  . metoprolol tartrate  25 mg Oral BID  . sodium chloride  3 mL Intravenous Q12H  . vancomycin  1,000 mg Intravenous Q12H   Continuous Infusions: . sodium chloride 75 mL/hr at 08/19/13 1610    Principal Problem:   Cellulitis of breast right Active Problems:   Atrial fibrillation   Breast cancer, stage 1 s/p rt lumpectomy and radiation 2012   COPD (chronic  obstructive pulmonary disease)   Chronic respiratory failure with hypoxia   Weakness generalized    Time spent: 25 minutes.     Charlott Calvario  [facility] Pager [phone]. If 7PM-7AM, please contact night-coverage at [email or web address], password [facility] 08/19/2013, 1:24 PM  LOS: 1 day

## 2013-08-19 NOTE — ED Notes (Signed)
Cellulitis on right breast marked and dated

## 2013-08-19 NOTE — H&P (Signed)
PCP:   [name], MD   Chief Complaint:  weakness  HPI: 72 yo female with MMP comes in with generalized weakness today.  Also has had rash to breast rt for the last 2 days, and is painful.  No fevers.  No abd pain.  No cp.  No drainage from breast.  No recent injury to breast.  No cough.  Chronically on oxygen at home for copd 2 L [place] cont.  No n/v/d.  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  Had last routine mammography last month per pt reports normal.  No chills.  Review of Systems:  Positive and negative as per HPI otherwise all other systems are negative  Past Medical History: Past Medical History  Diagnosis Date  . Diabetes mellitus   . Asthma   . Bronchitis   . Hernia   . Anemia   . Uterine cancer     s/p hysterectomy  . Arthritis   . Hernia   . Morbid obesity   . Sarcoid   . OSA (obstructive sleep apnea)     Uses nasal CPAP Q HS  . Atrial fibrillation   . Breast cancer, stage 1 10/17/2011    s/p lumpectomy and XRT  . Cough   . Wheezing   . Chills   . Constipation   . Bruises easily   . Gout attack     ankle, then wrist and hands  . Hyperlipidemia    Past Surgical History  Procedure Laterality Date  . Total knee arthroplasty  2001  . Abdominal hysterectomy  2006  . Resection of uterine cancer    . Left lower leg fx--casted    . Breast lumpectomy Right     Medications: Prior to Admission medications   Medication Sig Start Date End Date Taking? Authorizing Provider  acetaminophen (TYLENOL) 500 MG tablet Take 1,000 mg by mouth every 6 (six) hours as needed. pain   Yes Historical Provider, MD  albuterol (PROVENTIL HFA) 108 (90 BASE) MCG/ACT inhaler Inhale 2 puffs into the lungs every 6 (six) hours as needed for wheezing.   Yes Historical Provider, MD  allopurinol (ZYLOPRIM) 100 MG tablet Take 200 mg by mouth daily.  11/04/12  Yes Historical Provider, MD  apixaban (ELIQUIS) 5 MG TABS tablet Take 1 tablet  (5 mg total) by mouth 2 (two) times daily. 07/23/13  Yes Chrystie Nose, MD  Ascorbic Acid (VITAMIN C) 1000 MG tablet Take 1,000 mg by mouth daily.   Yes Historical Provider, MD  chlorpheniramine-HYDROcodone (TUSSIONEX) 10-8 MG/5ML LQCR Take 5 mLs by mouth every 12 (twelve) hours as needed. 05/14/13  Yes Joseph Art, DO  colchicine 0.6 MG tablet Take 1 tablet (0.6 mg total) by mouth 2 (two) times daily. 11/22/12  Yes [name], MD  diltiazem (TIAZAC) 360 MG 24 hr capsule Take 360 mg by mouth daily.  06/06/11  Yes Historical Provider, MD  exemestane (AROMASIN) 25 MG tablet Take 1 tablet (25 mg total) by mouth daily after breakfast. 03/08/13  Yes Victorino December, MD  ferrous fumarate (HEMOCYTE - 106 MG FE) 325 (106 FE) MG TABS Take 1 tablet by mouth 3 (three) times daily.   Yes Historical Provider, MD  fexofenadine (ALLEGRA) 180 MG tablet Take 180 mg by mouth daily.   Yes Historical Provider, MD  Fluticasone-Salmeterol (ADVAIR) 250-50 MCG/DOSE AEPB Inhale 1 puff into the lungs every 12 (twelve) hours.   Yes Historical Provider, MD  furosemide (LASIX) 20 MG tablet Take 1 tablet by mouth every morning.  05/31/13  Yes Historical Provider, MD  guaiFENesin (MUCINEX) 600 MG 12 hr tablet Take 1,200 mg by mouth 2 (two) times daily.   Yes Historical Provider, MD  HUMULIN N 100 UNIT/ML injection Inject 5 Units into the skin as needed (5 units if blood sugar is greater than 150).  02/05/13  Yes Historical Provider, MD  HYDROcodone-acetaminophen (NORCO/VICODIN) 5-325 MG per tablet Take 1 tablet by mouth every 6 (six) hours as needed. For pain. 05/14/13  Yes Jessica U Vann, DO  insulin glargine (LANTUS) 100 UNIT/ML injection Inject 0.28 mLs (28 Units total) into the skin at bedtime. 05/14/13  Yes Jessica U Vann, DO  ipratropium (ATROVENT) 0.02 % nebulizer solution Take 2.5 mLs (0.5 mg total) by nebulization every 6 (six) hours. 11/22/12  Yes [name], MD  levalbuterol (XOPENEX) 0.63 MG/3ML nebulizer  solution Take 3 mLs (0.63 mg total) by nebulization every 6 (six) hours. 11/22/12  Yes [name], MD  LORazepam (ATIVAN) 0.5 MG tablet Take 0.5 mg by mouth at bedtime as needed. For sleep.   Yes Historical Provider, MD  meclizine (ANTIVERT) 25 MG tablet Take 6.25-12.5 mg by mouth 3 (three) times daily as needed. For dizziness.   Yes Historical Provider, MD  metFORMIN (GLUCOPHAGE) 500 MG tablet Take 500 mg by mouth 2 (two) times daily with a meal. If cbg is greater than 120 pt takes medication   Yes Historical Provider, MD  metoprolol tartrate (LOPRESSOR) 25 MG tablet Take 25 mg by mouth 2 (two) times daily.  05/13/11  Yes Historical Provider, MD  Multiple Vitamin (MULTIVITAMIN WITH MINERALS) TABS Take 1 tablet by mouth daily.   Yes Historical Provider, MD  ondansetron (ZOFRAN) 4 MG tablet Take 4 mg by mouth every 8 (eight) hours as needed for nausea.   Yes Historical Provider, MD  simvastatin (ZOCOR) 40 MG tablet Take 40 mg by mouth at bedtime.   Yes Historical Provider, MD    Allergies:   Allergies  Allergen Reactions  . Aspirin     Avoids due to being on blood thinners  . Contrast Media [Iodinated Diagnostic Agents] Nausea And Vomiting  . Ibuprofen     Avoids due to being on blood thinners  . Pravachol Other (See Comments)    Muscle pain  . Pravastatin Sodium     Social History:  reports that she has never smoked. She has never used smokeless tobacco. She reports that she does not drink alcohol or use illicit drugs.  Family History: Family History  Problem Relation Age of Onset  . Diabetes Mother   . Cancer Father     colon  . Diabetes Brother   . Cancer Sister   . Diabetes Sister     Physical Exam: Filed Vitals:   08/18/13 2102 08/18/13 2223 08/18/13 2315 08/18/13 2329  BP: 124/76  110/58   Pulse: 104  53 97  Temp: 99.3 F (37.4 C) 99.3 F (37.4 C)    TempSrc: Oral     Resp:   26   SpO2: 96%  98%    General appearance: alert, cooperative and no  distress Head: Normocephalic, without obvious abnormality, atraumatic Eyes: negative Nose: Nares normal. Septum midline. Mucosa normal. No drainage or sinus tenderness. Neck: no JVD and supple, symmetrical, trachea midline Lungs: clear to auscultation bilaterally Breasts: positive findings: cellulitis right breast both upper quadrants, no nipple discharge, some induration around site of previous scar in  rt upper outer quadrant, no flunctuance.  no masses.  left breast normal. Heart: regular rate and rhythm, S1, S2 normal, no murmur, click, rub or gallop Abdomen: soft, non-tender; bowel sounds normal; no masses,  no organomegaly Extremities: extremities normal, atraumatic, no cyanosis or edema Pulses: 2+ and symmetric Skin: Skin color, texture, turgor normal. No rashes or lesions Neurologic: Grossly normal    Labs on Admission:   Recent Labs  08/16/13 1424 08/18/13 2145  NA 142 137  K 4.4 3.6  CL 101 96  CO2 33* 29  GLUCOSE 182* 194*  BUN 18 16  CREATININE 0.84 0.78  CALCIUM 10.4 10.8*    Recent Labs  08/16/13 1424 08/18/13 2145  AST 15 15  ALT <8 8  ALKPHOS 67 58  BILITOT 0.8 1.0  PROT 6.6 6.9  ALBUMIN 4.7 4.0    Recent Labs  08/16/13 1424 08/18/13 2145  WBC 6.0 16.9*  NEUTROABS  --  15.4*  HGB 13.1 13.7  HCT 43.0 42.4  MCV 75.8* 75.8*  PLT 221 138*    Recent Labs  08/18/13 2145  CKTOTAL 33   Radiological Exams on Admission: Dg Chest Portable 1 View  08/18/2013   *RADIOLOGY REPORT*  Clinical Data: TIA  PORTABLE CHEST - 1 VIEW  Comparison: Chest radiograph 06/06/2013  Findings: Stable mildly enlarged heart silhouette.  Chronic bronchitic change present.  No effusion, infiltrate, or pneumothorax.  Mild basilar atelectasis.  IMPRESSION: No acute cardiopulmonary process.   Original Report Authenticated By: Genevive Bi, M.D.    Assessment/Plan 72 yo female with generalized weakness, leukocytosis and rt breast cellulitis with h/o rt stage 1 breast  cancer in 2012  Principal Problem:   Cellulitis of breast right Active Problems:   Atrial fibrillation   Breast cancer, stage 1 s/p rt lumpectomy and radiation 2012   COPD (chronic obstructive pulmonary disease)   Chronic respiratory failure with hypoxia   Weakness generalized  Place on vanc and rocephin for cellulitis.  Obtain ultrasound of rt breast.  Cannot find in our system her mammagraphy she reports having last month.  The area of this cellulitis is concerning with her history of cancer in same breast.  Will need to confirm she has had recent mammogram.  obs on med surg.  Full code.  Liev Brockbank A 08/19/2013, 12:11 AM

## 2013-08-19 NOTE — Progress Notes (Signed)
ANTIBIOTIC CONSULT NOTE - INITIAL  Pharmacy Consult for Vancomycin  Indication: cellulitis  Allergies  Allergen Reactions  . Aspirin     Avoids due to being on blood thinners  . Contrast Media [Iodinated Diagnostic Agents] Nausea And Vomiting  . Ibuprofen     Avoids due to being on blood thinners  . Pravachol Other (See Comments)    Muscle pain  . Pravastatin Sodium     Patient Measurements: Height: 4' 11.06" (150 cm) Weight: 242 lb 4.6 oz (109.9 kg) IBW/kg (Calculated) : 43.33 Adjusted Body Weight: 70 kg   Vital Signs: Temp: 98.2 F (36.8 C) (09/08 0200) Temp src: Oral (09/08 0200) BP: 101/46 mmHg (09/08 0200) Pulse Rate: 75 (09/08 0200) Intake/Output from previous day: 09/07 0701 - 09/08 0700 In: 50 [I.V.:50] Out: -  Intake/Output from this shift: Total I/O In: 50 [I.V.:50] Out: -   Labs:  Recent Labs  08/16/13 1424 08/18/13 2145  WBC 6.0 16.9*  HGB 13.1 13.7  PLT 221 138*  CREATININE 0.84 0.78   Estimated Creatinine Clearance: 69.1 ml/min (by C-G formula based on Cr of 0.78). No results found for this basename: VANCOTROUGH, VANCOPEAK, VANCORANDOM, GENTTROUGH, GENTPEAK, GENTRANDOM, TOBRATROUGH, TOBRAPEAK, TOBRARND, AMIKACINPEAK, AMIKACINTROU, AMIKACIN,  in the last 72 hours   Microbiology: No results found for this or any previous visit (from the past 720 hour(s)).  Medical History: Past Medical History  Diagnosis Date  . Diabetes mellitus   . Asthma   . Bronchitis   . Hernia   . Anemia   . Uterine cancer     s/p hysterectomy  . Arthritis   . Hernia   . Morbid obesity   . Sarcoid   . OSA (obstructive sleep apnea)     Uses nasal CPAP Q HS  . Atrial fibrillation   . Breast cancer, stage 1 10/17/2011    s/p lumpectomy and XRT  . Cough   . Wheezing   . Chills   . Constipation   . Bruises easily   . Gout attack     ankle, then wrist and hands  . Hyperlipidemia     Medications:  APAP  Albuterol  Allopurinol  Eliquis  Vit C  Colchicine   Cardizem  Aromasin  Allegra  Advair  Lasix  Mucinex  NPH  Norco  Lantus  Meclizine  Metformin  Lopressor  MVI  Zofran  Zocor  Assessment: 72 yo female with right breast cellulitis for empiric antibiotics.  Vancomycin 1 g IV given in ED at 0100  Goal of Therapy:  Vancomycin trough level 10-15 mcg/ml  Plan:  Vancomycin 1 g IV q12h, next dose at 0600  [name], Gary Fleet 08/19/2013,2:19 AM

## 2013-08-19 NOTE — Progress Notes (Signed)
PT Cancellation Note  Patient Details Name: [name] MRN: [id number] DOB: October 31, 1941   Cancelled Treatment:    Reason Eval/Treat Not Completed: Patient at procedure or test/unavailable Will f/u tomorrow.    Ludger Nutting 08/19/2013, 2:46 PM

## 2013-08-20 DIAGNOSIS — E119 Type 2 diabetes mellitus without complications: Secondary | ICD-10-CM

## 2013-08-20 LAB — BASIC METABOLIC PANEL
BUN: 11 mg/dL (ref 6–23)
Calcium: 9.7 mg/dL (ref 8.4–10.5)
Creatinine, Ser: 0.7 mg/dL (ref 0.50–1.10)
GFR calc Af Amer: >90 mL/min (ref >90)
GFR calc non Af Amer: 84 mL/min — ABNORMAL LOW (ref >90)

## 2013-08-20 LAB — GLUCOSE, CAPILLARY
Glucose-Capillary: 132 mg/dL — ABNORMAL HIGH (ref 70–99)
Glucose-Capillary: 209 mg/dL — ABNORMAL HIGH (ref 70–99)
Glucose-Capillary: 218 mg/dL — ABNORMAL HIGH (ref 70–99)
Glucose-Capillary: 159 mg/dL — ABNORMAL HIGH (ref 70–99)

## 2013-08-20 LAB — CBC
MCHC: 31.5 g/dL (ref 30.0–36.0)
Platelets: 117 10*3/uL — ABNORMAL LOW (ref 150–400)
RDW: 18.1 % — ABNORMAL HIGH (ref 11.5–15.5)
WBC: 7.5 10*3/uL (ref 4.0–10.5)

## 2013-08-20 MED ORDER — FUROSEMIDE 20 MG PO TABS: 20.0000 mg | ORAL_TABLET | Freq: Every morning | ORAL | Status: DC

## 2013-08-20 MED ORDER — FUROSEMIDE 20 MG PO TABS
20.0000 mg | ORAL_TABLET | Freq: Every morning | ORAL | Status: DC
  Administered 2013-08-21 – 2013-08-26 (×6): 20 mg via ORAL
  Filled 2013-08-20 (×6): qty 1

## 2013-08-20 MED ORDER — POTASSIUM CHLORIDE CRYS ER 20 MEQ PO TBCR
40.0000 meq | EXTENDED_RELEASE_TABLET | Freq: Once | ORAL | Status: AC
  Administered 2013-08-20: 40 meq via ORAL
  Filled 2013-08-20: qty 2

## 2013-08-20 NOTE — Evaluation (Signed)
Physical Therapy Evaluation Patient Details Name: [name] MRN: [id number] DOB: 08/06/1941 Today's Date: 08/20/2013 Time: 1027-1108 PT Time Calculation (min): 41 min  PT Assessment / Plan / Recommendation History of Present Illness  Adm 08/18/13 due to weakness and inability to stand from her chair. Found to have cellulitis of Rt breast. PMHx includes bil TKR, COPD (uses 2L O2 at home), DM, sarcoid, morbid obesity, afib.  Clinical Impression  Pt admitted with weakness and cellulitis. Pt currently with functional limitations due to the deficits listed below (see PT Problem List). Pt has managed at home alone with little outside help and hopes to return home on d/c. Pt will benefit from skilled PT to increase their independence and safety with mobility to allow discharge to the venue listed below.       PT Assessment  Patient needs continued PT services    Follow Up Recommendations  Home health PT;Supervision - Intermittent    Does the patient have the potential to tolerate intense rehabilitation      Barriers to Discharge Decreased caregiver support      Equipment Recommendations  None recommended by PT    Recommendations for Other Services OT consult   Frequency Min 3X/week    Precautions / Restrictions Precautions Precautions: Fall Precaution Comments: denies falls at home PTA   Pertinent Vitals/Pain bil leg pain; rated 10/10, however when explained what a 10 means, she stated "well, it's clearly not a ten, but they hurt very badly"; RN provided medication to assist with pain control       Mobility  Bed Mobility Bed Mobility: Sit to Supine Sit to Supine: 4: Min assist;HOB flat Details for Bed Mobility Assistance: very light minimal assist to raise Lt leg (lead leg) onto bed; pt then able to lift trailing leg herself; vc for technique to straighten herself in the bed (scoot laterally) Transfers Transfers: Sit to Stand;Stand to Sit Sit to Stand: 4: Min guard;With  upper extremity assist Stand to Sit: 4: Min guard;With upper extremity assist Details for Transfer Assistance: x2 without physical assist (close guarding); proper sequencing with RW Ambulation/Gait Ambulation/Gait Assistance: 4: Min guard Ambulation Distance (Feet): 40 Feet Assistive device: Rolling walker Ambulation/Gait Assistance Details: pt kyphotic and pushes RW out in front of her (this RW slightly too tall for pt, which also contributes to this posture);  Gait Pattern: Step-through pattern;Decreased stride length;Trunk flexed;Wide base of support Gait velocity: very slow; pt very aware of her surroundings and states she is slow "because I don't want to fall"    Exercises General Exercises - Lower Extremity Ankle Circles/Pumps: AROM;Both;10 reps;Seated Long Arc Quad: AROM;Both;10 reps;Seated   PT Diagnosis: Difficulty walking;Acute pain  PT Problem List: Decreased strength;Decreased activity tolerance;Decreased mobility;Obesity;Pain;Decreased knowledge of use of DME PT Treatment Interventions: DME instruction;Gait training;Functional mobility training;Therapeutic exercise;Patient/family education     PT Goals(Current goals can be found in the care plan section) Acute Rehab PT Goals Patient Stated Goal: to return home (not rehab then home) PT Goal Formulation: With patient Time For Goal Achievement: 08/27/13 Potential to Achieve Goals: Good  Visit Information  Last PT Received On: 08/20/13 Assistance Needed: +1 History of Present Illness: Adm 08/18/13 due to weakness and inability to stand from her chair. Found to have cellulitis of Rt breast. PMHx includes bil TKR, COPD (uses 2L O2 at home), DM, sarcoid, morbid obesity, afib.       Prior Functioning  Home Living Family/patient expects to be discharged to:: Private residence Living Arrangements: Alone Available  Help at Discharge: Family;Personal care attendant;Available PRN/intermittently Type of Home: Apartment  (handicapped) Home Access: Level entry Home Layout: One level Home Equipment: Walker - 4 wheels;Tub bench;Grab bars - toilet;Grab bars - tub/shower Additional Comments: son does grocery shopping; 1x/month housekeeper Prior Function Level of Independence: Independent with assistive device(s) Comments: chronic 2L O2; uses 4 wheel RW at all times Communication Communication: No difficulties    Cognition  Cognition Arousal/Alertness: Awake/alert Behavior During Therapy: WFL for tasks assessed/performed Overall Cognitive Status: Within Functional Limits for tasks assessed    Extremity/Trunk Assessment Upper Extremity Assessment Upper Extremity Assessment: Defer to OT evaluation Lower Extremity Assessment Lower Extremity Assessment: RLE deficits/detail;LLE deficits/detail RLE Deficits / Details: reports pain throughout (difficulty providing specifics, sounds more muscular than joints--? soreness from struggle to stand on day of admission); knee extension 4/5, dorsiflexion 5/5 LLE Deficits / Details: reports pain throughout (difficulty providing specifics, sounds more muscular than joints--? soreness from struggle to stand on day of admission); knee extension 4/5, dorsiflexion 5/5 Cervical / Trunk Assessment Cervical / Trunk Assessment: Kyphotic   Balance    End of Session PT - End of Session Equipment Utilized During Treatment: Oxygen Activity Tolerance: Patient tolerated treatment well Patient left: in bed;with call bell/phone within reach Nurse Communication: Mobility status;Other (comment) (pt strong desire to go home; needs to amb with nursing)  GP     Shakiah Wester 08/20/2013, 11:26 AM Pager [phone]

## 2013-08-20 NOTE — Evaluation (Addendum)
Occupational Therapy Evaluation Patient Details Name: [name] MRN: [id number] DOB: [birth year] Today's Date: 08/20/2013 Time: 7829-5621 OT Time Calculation (min): 26 min  OT Assessment / Plan / Recommendation History of present illness Adm 08/18/13 due to weakness and inability to stand from her chair. Found to have cellulitis of Rt breast. PMHx includes bil TKR, COPD (uses 2L O2 at home), DM, sarcoid, morbid obesity, afib.   Clinical Impression   Pt admitted with weakness and cellulitis. Pt currently with functional limitations due to the deficits listed below (see OT Problem List). Pt has managed at home alone (independent with bathing/dressing/transfers) with little outside help (son gets groceries and someone helps clean) and hopes to return home on d/c. Pt will benefit from skilled OT to increase their independence and safety to allow discharge to the venue listed below.      OT Assessment  Patient needs continued OT Services    Follow Up Recommendations  Home health OT;Supervision/Assistance - 24 hour    Barriers to Discharge Decreased caregiver support    Equipment Recommendations  None recommended by OT    Recommendations for Other Services    Frequency  Min 2X/week    Precautions / Restrictions Precautions Precautions: Fall Precaution Comments: denies falls at home PTA   Pertinent Vitals/Pain Pain 6/10 in legs. Called for pain meds. Nurse notified.     ADL  Eating/Feeding: Independent Where Assessed - Eating/Feeding: Chair Grooming: Performed;Wash/dry hands;Min guard;Brushing hair Where Assessed - Grooming: Supported standing;Unsupported standing Upper Body Bathing: Set up Where Assessed - Upper Body Bathing: Unsupported sitting Lower Body Bathing: Min guard Where Assessed - Lower Body Bathing: Supported sit to stand Upper Body Dressing: Set up Where Assessed - Upper Body Dressing: Unsupported sitting Lower Body Dressing: Min guard Where Assessed - Lower  Body Dressing: Supported sit to Pharmacist, hospital: Hydrographic surveyor Method: Sit to Barista: Comfort height toilet;Grab bars Toileting - Architect and Hygiene: Minimal assistance Where Assessed - Engineer, mining and Hygiene: Sit to stand from 3-in-1 or toilet Tub/Shower Transfer Method: Not assessed Equipment Used: Gait belt;Rolling walker;Other (comment) (O2) Transfers/Ambulation Related to ADLs: Min guard for ambulation and transfers. ADL Comments: Pt ambulated to bathroom and performed toileting tasks as well as grooming tasks at sink. Left UE supported while washing face-standby assistance.  Pt able to reach to don/doff both socks. Educated to stand in front of chair/bed when pulling up LB clothing for safety.  OT educated on use of toilet aide that may help with hygiene and make task easier- she was able to clean self with Min guard assist. OT assisted holding gown up during hygiene.    OT Diagnosis: Acute pain;Generalized weakness  OT Problem List: Decreased strength;Decreased activity tolerance;Decreased knowledge of use of DME or AE;Decreased knowledge of precautions;Pain;Obesity OT Treatment Interventions: Self-care/ADL training;Therapeutic exercise;DME and/or AE instruction;Therapeutic activities;Patient/family education;Balance training   OT Goals(Current goals can be found in the care plan section) Acute Rehab OT Goals Patient Stated Goal: to go home and get better OT Goal Formulation: With patient Time For Goal Achievement: 08/27/13 Potential to Achieve Goals: Good ADL Goals Pt Will Perform Grooming: with modified independence;standing Pt Will Perform Lower Body Bathing: with modified independence;sit to/from stand Pt Will Perform Lower Body Dressing: with modified independence;sit to/from stand Pt Will Transfer to Toilet: with modified independence;ambulating;grab bars (comfort height toilet) Pt Will Perform  Toileting - Clothing Manipulation and hygiene: with modified independence;sit to/from stand Pt Will Perform Tub/Shower Transfer: Tub  transfer;with supervision;ambulating;tub bench;rolling walker  Visit Information  Last OT Received On: 08/20/13 Assistance Needed: +1 History of Present Illness: Adm 08/18/13 due to weakness and inability to stand from her chair. Found to have cellulitis of Rt breast. PMHx includes bil TKR, COPD (uses 2L O2 at home), DM, sarcoid, morbid obesity, afib.       Prior Functioning     Home Living Family/patient expects to be discharged to:: Private residence Living Arrangements: Alone Available Help at Discharge: Family;Personal care attendant;Available PRN/intermittently Type of Home: Apartment (handicapped) Home Access: Level entry Home Layout: One level Home Equipment: Walker - 4 wheels;Tub bench;Grab bars - toilet;Grab bars - tub/shower;Bedside commode Additional Comments: son does grocery shopping; 1x/month housekeeper Prior Function Level of Independence: Needs assistance ADL's / Homemaking Assistance Needed: son does grocery shopping; 1x/month housekeeper Comments: chronic 2L O2; uses 4 wheel RW at all times Communication Communication: No difficulties Dominant Hand: Right         Vision/Perception Vision - History Baseline Vision: Wears glasses only for reading   Cognition  Cognition Arousal/Alertness: Awake/alert Behavior During Therapy: WFL for tasks assessed/performed Overall Cognitive Status: Within Functional Limits for tasks assessed    Extremity/Trunk Assessment Upper Extremity Assessment Upper Extremity Assessment: Generalized weakness     Mobility Bed Mobility Bed Mobility: Sitting - Scoot to Edge of Bed;Right Sidelying to Sit;Supine to Sit Right Sidelying to Sit: 5: Supervision;HOB elevated Supine to Sit: 5: Supervision;HOB elevated Sitting - Scoot to Edge of Bed: 4: Min guard Details for Bed Mobility Assistance: Pt moved  legs to EOB and then rolled UB to sidelying position and pushed up to sitting from side.  Transfers Transfers: Sit to Stand;Stand to Sit Sit to Stand: 4: Min guard;With upper extremity assist;From bed;From toilet Stand to Sit: 4: Min guard;With upper extremity assist;To toilet;To chair/3-in-1 Details for Transfer Assistance: Min guard for safety. Reinforced hand placement.     Exercise     Balance     End of Session OT - End of Session Equipment Utilized During Treatment: Gait belt;Rolling walker;Oxygen Activity Tolerance: Patient tolerated treatment well Patient left: in chair;with call bell/phone within reach Nurse Communication: Other (comment) (called for pain meds; told nurse pain level)  GO     Earlie Raveling OTR/L [phone] 08/20/2013, 4:04 PM

## 2013-08-20 NOTE — Progress Notes (Signed)
Pt. Was placed on CPAP of 9cm H2O per pt.'s home settings via nasal mask. Pt. Is tolerating CPAP well at this time without any complications.

## 2013-08-20 NOTE — Progress Notes (Signed)
[facility] PROGRESS NOTE  [name] [name]:[id number] DOB: 1941-06-04 DOA: 08/18/2013 PCP: [name], MD  Assessment/Plan: 1- Cellulitis of breast right;  -Slightly decreases redness.  -Continue with Vancomycin day 2. - Breast US:  Diffuse breast parenchymal edema and skin thickening, worse in the lower outer quadrant. Differential diagnosis includes mastitis or locally advanced vs inflammatory breast cancer. If there is lack of clinical improvement, tissue diagnosis would be recommended with skin biopsy. No evidence for abscess.  -Patient will need bilateral diagnostic mammogram and right breast ultrasound are suggested following a course of antibiotics.  -If no improvement on IV antibiotics patient will need skin biopsy as suggested by radiologist.  - WBC trending down. This morning results pending.   2-Atrial fibrillation: continue with metoprolol and Eliquis. SBP soft hold Cardizem.   3-Breast cancer, stage 1 s/p rt lumpectomy and radiation 2012: needs follow up with primary oncologist. Added Dr [name] to consulting team.  4-COPD (chronic obstructive pulmonary disease): appears stable. Continue with albuterol PRN and Advair.   5-Diabetes: SSI, Continue with lower dose lantus.  6-Chronic respiratory failure with hypoxia: stable. No Hyponatremia. Sodium was at 135 yesterday. Will resume lasix.  7-Weakness generalized: secondary probably to acute illness. PT ordered.  8-Thrombocytopenia: monitor.  9-Hypokalemia: potasium supplement ordered.   Code Status: Full Code.  Family Communication: Care discussed with patient.  Disposition Plan: to be determine. PT, OT consult.    Consultants:  None.   Procedures: Right breast US: 1. Diffuse breast parenchymal edema and skin thickening, worse in  the lower outer quadrant.  2. Differential diagnosis includes mastitis or locally advanced vs  inflammatory breast cancer. If there is lack of clinical  improvement, tissue  diagnosis would be recommended with skin  biopsy. This study was performed while the patient was an  inpatient. Follow-up bilateral diagnostic mammogram and right  breast ultrasound are suggested following a course of antibiotics.  3. No evidence for abscess.     Antibiotics:  Ceftriaxone 9-7// 9-8  Vancomycin  9-7  HPI/Subjective: Feels redness of right breast has decrease. Feeling better this morning, better appetite. Less tired. Some what sleepy.   Objective: Filed Vitals:   08/20/13 0525  BP: 135/82  Pulse: 80  Temp: 98.4 F (36.9 C)  Resp:     Intake/Output Summary (Last 24 hours) at 08/20/13 1021 Last data filed at 08/20/13 0520  Gross per 24 hour  Intake 1967.5 ml  Output   1150 ml  Net  817.5 ml   Filed Weights   08/19/13 0200  Weight: 109.9 kg (242 lb 4.6 oz)    Exam:   General:  No distress.   Cardiovascular: S 1, S 2 RRR  Respiratory: CTA  Abdomen: BS present, soft, NT  Musculoskeletal: no edema.  Skin: right breast with redness, warm.   Data Reviewed: Basic Metabolic Panel:  Recent Labs Lab 08/16/13 1424 08/18/13 2145 08/19/13 0510  NA 142 137 135  K 4.4 3.6 3.3*  CL 101 96 95*  CO2 33* 29 29  GLUCOSE 182* 194* 236*  BUN 18 16 16   CREATININE 0.84 0.78 0.82  CALCIUM 10.4 10.8* 9.9   Liver Function Tests:  Recent Labs Lab 08/16/13 1424 08/18/13 2145  AST 15 15  ALT <8 8  ALKPHOS 67 58  BILITOT 0.8 1.0  PROT 6.6 6.9  ALBUMIN 4.7 4.0   No results found for this basename: LIPASE, AMYLASE,  in the last 168 hours No results found for this basename: AMMONIA,  in  the last 168 hours CBC:  Recent Labs Lab 08/16/13 1424 08/18/13 2145 08/19/13 0510  WBC 6.0 16.9* 12.3*  NEUTROABS  --  15.4*  --   HGB 13.1 13.7 12.2  HCT 43.0 42.4 39.7  MCV 75.8* 75.8* 77.2*  PLT 221 138* 127*   Cardiac Enzymes:  Recent Labs Lab 08/18/13 2145  CKTOTAL 33   BNP (last 3 results)  Recent Labs  05/05/13 0519 05/06/13 0515  05/11/13 0450  PROBNP 3589.0* 3322.0* 1374.0*   CBG:  Recent Labs Lab 08/19/13 0748 08/19/13 1200 08/19/13 1655 08/19/13 2110 08/20/13 0742  GLUCAP 157* 131* 142* 172* 132*    Recent Results (from the past 240 hour(s))  CULTURE, BLOOD (ROUTINE X 2)     Status: None   Collection Time    08/19/13 12:00 AM      Result Value Range Status   Specimen Description BLOOD LEFT ARM   Final   Special Requests BOTTLES DRAWN AEROBIC ONLY 9CC   Final   Culture  Setup Time     Final   Value: 08/19/2013 13:17     Performed at [facility]   Culture     Final   Value:        BLOOD CULTURE RECEIVED NO GROWTH TO DATE CULTURE WILL BE HELD FOR 5 DAYS BEFORE ISSUING A FINAL NEGATIVE REPORT     Performed at [facility]   Report Status PENDING   Incomplete  CULTURE, BLOOD (ROUTINE X 2)     Status: None   Collection Time    08/19/13 12:05 AM      Result Value Range Status   Specimen Description BLOOD LEFT HAND   Final   Special Requests BOTTLES DRAWN AEROBIC ONLY 10CC   Final   Culture  Setup Time     Final   Value: 08/19/2013 13:17     Performed at [facility]   Culture     Final   Value:        BLOOD CULTURE RECEIVED NO GROWTH TO DATE CULTURE WILL BE HELD FOR 5 DAYS BEFORE ISSUING A FINAL NEGATIVE REPORT     Performed at [facility]   Report Status PENDING   Incomplete     Studies: US Breast Right  08/19/2013   *RADIOLOGY REPORT*  Clinical Data: History of right breast cancer.  Status post lumpectomy.  The patient has diffuse redness of the right breast.  ULTRASOUND RIGHT BREAST  Comparison: None available  Findings: Ultrasound performed of the right breast, assessing each quadrant.  There is diffuse parenchymal edema throughout the breast, worse within the lower outer quadrant.  Within this quadrant, there is marked skin thickening, measuring up to 9 mm. No discrete abscess identified.  IMPRESSION:  1.  Diffuse breast parenchymal edema and skin thickening,  worse in the lower outer quadrant. 2.  Differential diagnosis includes mastitis or locally advanced vs inflammatory breast cancer. If there is lack of clinical improvement, tissue diagnosis would be recommended with skin biopsy.  This study was performed while the patient was an inpatient.  Follow-up bilateral diagnostic mammogram and right breast ultrasound are suggested following a course of antibiotics. 3.  No evidence for abscess.  The findings were discussed with Dr. Dianah Field on 08/19/2013 at 3:15 p.m.   Original Report Authenticated By: Norva Pavlov, M.D.   Dg Chest Portable 1 View  08/18/2013   *RADIOLOGY REPORT*  Clinical Data: TIA  PORTABLE CHEST - 1 VIEW  Comparison: Chest  radiograph 06/06/2013  Findings: Stable mildly enlarged heart silhouette.  Chronic bronchitic change present.  No effusion, infiltrate, or pneumothorax.  Mild basilar atelectasis.  IMPRESSION: No acute cardiopulmonary process.   Original Report Authenticated By: Genevive Bi, M.D.    Scheduled Meds: . allopurinol  200 mg Oral Daily  . apixaban  5 mg Oral BID  . insulin aspart  0-9 Units Subcutaneous TID WC  . insulin glargine  15 Units Subcutaneous QHS  . metoprolol tartrate  25 mg Oral BID  . mometasone-formoterol  2 puff Inhalation BID  . sodium chloride  3 mL Intravenous Q12H  . vancomycin  1,000 mg Intravenous Q12H   Continuous Infusions: . sodium chloride 75 mL/hr at 08/19/13 2121    Principal Problem:   Cellulitis of breast right Active Problems:   Atrial fibrillation   Breast cancer, stage 1 s/p rt lumpectomy and radiation 2012   COPD (chronic obstructive pulmonary disease)   Chronic respiratory failure with hypoxia   Weakness generalized    Time spent: 25 minutes.     Soraya Paquette  [facility] Pager [phone]. If 7PM-7AM, please contact night-coverage at [email or web address], password [facility] 08/20/2013, 10:21 AM  LOS: 2 days

## 2013-08-21 DIAGNOSIS — J449 Chronic obstructive pulmonary disease, unspecified: Secondary | ICD-10-CM

## 2013-08-21 LAB — BASIC METABOLIC PANEL
Chloride: 103 mEq/L (ref 96–112)
Creatinine, Ser: 0.67 mg/dL (ref 0.50–1.10)
GFR calc Af Amer: >90 mL/min (ref >90)
Potassium: 4 mEq/L (ref 3.5–5.1)
Sodium: 141 mEq/L (ref 135–145)

## 2013-08-21 LAB — CBC
MCV: 77.7 fL — ABNORMAL LOW (ref 78.0–100.0)
Platelets: 128 10*3/uL — ABNORMAL LOW (ref 150–400)
RBC: 5.15 MIL/uL — ABNORMAL HIGH (ref 3.87–5.11)
RDW: 18.1 % — ABNORMAL HIGH (ref 11.5–15.5)
WBC: 6.8 10*3/uL (ref 4.0–10.5)

## 2013-08-21 LAB — GLUCOSE, CAPILLARY: Glucose-Capillary: 134 mg/dL — ABNORMAL HIGH (ref 70–99)

## 2013-08-21 MED ORDER — INFLUENZA VAC SPLIT QUAD 0.5 ML IM SUSP
0.5000 mL | INTRAMUSCULAR | Status: AC
  Administered 2013-08-22: 0.5 mL via INTRAMUSCULAR
  Filled 2013-08-21: qty 0.5

## 2013-08-21 MED ORDER — PIPERACILLIN-TAZOBACTAM 3.375 G IVPB
3.3750 g | Freq: Three times a day (TID) | INTRAVENOUS | Status: DC
  Filled 2013-08-21 (×2): qty 50

## 2013-08-21 MED ORDER — LORAZEPAM 0.5 MG PO TABS
0.5000 mg | ORAL_TABLET | Freq: Once | ORAL | Status: AC
  Administered 2013-08-21: 0.5 mg via ORAL
  Filled 2013-08-21: qty 1

## 2013-08-21 MED ORDER — PIPERACILLIN-TAZOBACTAM 3.375 G IVPB
3.3750 g | Freq: Three times a day (TID) | INTRAVENOUS | Status: DC
  Administered 2013-08-21 – 2013-08-22 (×3): 3.375 g via INTRAVENOUS
  Filled 2013-08-21 (×5): qty 50

## 2013-08-21 NOTE — Progress Notes (Signed)
Physical Therapy Treatment Patient Details Name: ROCKIE VAWTER MRN: [id number] DOB: 1941/06/21 Today's Date: 08/21/2013 Time: 0812-0830 PT Time Calculation (min): 18 min  PT Assessment / Plan / Recommendation  History of Present Illness Adm 08/18/13 due to weakness and inability to stand from her chair. Found to have cellulitis of Rt breast. PMHx includes bil TKR, COPD (uses 2L O2 at home), DM, sarcoid, morbid obesity, afib.   PT Comments   Pt limited with mobility today due to bil LE pain. Pt is unsteady with gt and transfers and would benefit from STSNF, prior to returning home alone, however, she is refusing at this time. Will cont to f/u with pt to maximize functional mobility.   Follow Up Recommendations  SNF;Supervision/Assistance - 24 hour;Other (comment) (however, pt is refusing and will need HHPT )     Does the patient have the potential to tolerate intense rehabilitation     Barriers to Discharge        Equipment Recommendations  None recommended by PT    Recommendations for Other Services    Frequency Min 3X/week   Progress towards PT Goals Progress towards PT goals: Progressing toward goals  Plan Discharge plan needs to be updated    Precautions / Restrictions Precautions Precautions: Fall Precaution Comments: denies falls at home PTA Restrictions Weight Bearing Restrictions: No   Pertinent Vitals/Pain 6-7/10 bil LES; patient repositioned for comfort. amb on 3L O2; demo SOB after short amb distances      Mobility  Bed Mobility Bed Mobility: Not assessed Right Sidelying to Sit: 5: Supervision;HOB flat;With rails Supine to Sit: 5: Supervision;With rails Details for Bed Mobility Assistance: HOB flattened to simulate home; pt unable to transfer supine to sit without handrails; incr time due to pain in legs; pt sob when sitting on EOB  Transfers Transfers: Sit to Stand;Stand to Sit Sit to Stand: 4: Min guard;With upper extremity assist;From chair/3-in-1 Stand to  Sit: 4: Min guard;With upper extremity assist;To chair/3-in-1 Details for Transfer Assistance: Min guard for safety.  Ambulation/Gait Ambulation/Gait Assistance: 4: Min guard Ambulation Distance (Feet): 12 Feet (x2) Assistive device: Rolling walker Ambulation/Gait Assistance Details: cues for safety with RW and management of RW; pt with significant kyphotic posture and looks down when amb; min guard for safety; pt unsteady with gt; attributes unsteadiness today to bil LE pain  Gait Pattern: Step-through pattern;Decreased stride length;Trunk flexed;Wide base of support Gait velocity: decr due to bil LE pain today  General Gait Details: pt amb on 3L O2; demo SOB at end of session Stairs: No Wheelchair Mobility Wheelchair Mobility: No    Exercises General Exercises - Lower Extremity Ankle Circles/Pumps: Both;10 reps;Seated Long Arc Quad: Both;10 reps;Seated Hip Flexion/Marching: Both;10 reps;Seated   PT Diagnosis:    PT Problem List:   PT Treatment Interventions:     PT Goals (current goals can now be found in the care plan section) Acute Rehab PT Goals Patient Stated Goal: to go home PT Goal Formulation: With patient Time For Goal Achievement: 08/27/13 Potential to Achieve Goals: Good  Visit Information  Last PT Received On: 08/21/13 Assistance Needed: +1 History of Present Illness: Adm 08/18/13 due to weakness and inability to stand from her chair. Found to have cellulitis of Rt breast. PMHx includes bil TKR, COPD (uses 2L O2 at home), DM, sarcoid, morbid obesity, afib.    Subjective Data  Subjective: "my legs are hurting really bad today. Id like to go home but i just dont know with my legs hurting  this bad"  Patient Stated Goal: to go home   Cognition  Cognition Arousal/Alertness: Awake/alert Behavior During Therapy: WFL for tasks assessed/performed Overall Cognitive Status: Within Functional Limits for tasks assessed    Balance  Balance Balance Assessed: Yes Static  Sitting Balance Static Sitting - Balance Support: Bilateral upper extremity supported;Feet supported Static Sitting - Level of Assistance: 5: Stand by assistance  End of Session PT - End of Session Equipment Utilized During Treatment: Gait belt;Oxygen Activity Tolerance: Patient limited by pain Patient left: in chair;with call bell/phone within reach Nurse Communication: Mobility status   GP     Donell Sievert, [place] [phone] 08/21/2013, 11:41 AM

## 2013-08-21 NOTE — Progress Notes (Signed)
ANTIBIOTIC CONSULT NOTE - INITIAL  Pharmacy Consult for Zosyn Indication: cellulitis  Allergies  Allergen Reactions  . Aspirin     Avoids due to being on blood thinners  . Contrast Media [Iodinated Diagnostic Agents] Nausea And Vomiting  . Ibuprofen     Avoids due to being on blood thinners  . Pravachol Other (See Comments)    Muscle pain  . Pravastatin Sodium   Patient Measurements: Height: 4' 11.06" (150 cm) Weight: 242 lb 4.6 oz (109.9 kg) IBW/kg (Calculated) : 43.33 Vital Signs: Temp: 97.7 F (36.5 C) (09/10 1400) Temp src: Oral (09/10 1400) BP: 136/72 mmHg (09/10 1400) Pulse Rate: 82 (09/10 1400) Intake/Output from previous day: 09/09 0701 - 09/10 0700 In: -  Out: 350 [Urine:350] Intake/Output from this shift: Total I/O In: 480 [P.O.:480] Out: -  Labs:  Recent Labs  08/19/13 0510 08/20/13 1200 08/21/13 0550  WBC 12.3* 7.5 6.8  HGB 12.2 12.3 12.4  PLT 127* 117* 128*  CREATININE 0.82 0.70 0.67   Estimated Creatinine Clearance: 69.1 ml/min (by C-G formula based on Cr of 0.67). Microbiology: Recent Results (from the past 720 hour(s))  CULTURE, BLOOD (ROUTINE X 2)     Status: None   Collection Time    08/19/13 12:00 AM      Result Value Range Status   Specimen Description BLOOD LEFT ARM   Final   Special Requests BOTTLES DRAWN AEROBIC ONLY 9CC   Final   Culture  Setup Time     Final   Value: 08/19/2013 13:17     Performed at [facility]   Culture     Final   Value:        BLOOD CULTURE RECEIVED NO GROWTH TO DATE CULTURE WILL BE HELD FOR 5 DAYS BEFORE ISSUING A FINAL NEGATIVE REPORT     Performed at [facility]   Report Status PENDING   Incomplete  CULTURE, BLOOD (ROUTINE X 2)     Status: None   Collection Time    08/19/13 12:05 AM      Result Value Range Status   Specimen Description BLOOD LEFT HAND   Final   Special Requests BOTTLES DRAWN AEROBIC ONLY 10CC   Final   Culture  Setup Time     Final   Value: 08/19/2013 13:17   Performed at [facility]   Culture     Final   Value:        BLOOD CULTURE RECEIVED NO GROWTH TO DATE CULTURE WILL BE HELD FOR 5 DAYS BEFORE ISSUING A FINAL NEGATIVE REPORT     Performed at [facility]   Report Status PENDING   Incomplete   Medications:  Anti-infectives   Start     Dose/Rate Route Frequency Ordered Stop   08/19/13 2200  cefTRIAXone (ROCEPHIN) 1 g in dextrose 5 % 50 mL IVPB  Status:  Discontinued     1 g 100 mL/hr over 30 Minutes Intravenous Every 24 hours 08/19/13 0152 08/19/13 1328   08/19/13 0600  vancomycin (VANCOCIN) IVPB 1000 mg/200 mL premix     1,000 mg 200 mL/hr over 60 Minutes Intravenous Every 12 hours 08/19/13 0231     08/19/13 0000  vancomycin (VANCOCIN) IVPB 1000 mg/200 mL premix     1,000 mg 200 mL/hr over 60 Minutes Intravenous  Once 08/18/13 2348 08/19/13 0208   08/19/13 0000  cefTRIAXone (ROCEPHIN) 1 g in dextrose 5 % 50 mL IVPB     1 g 100 mL/hr  over 30 Minutes Intravenous  Once 08/18/13 2348 08/19/13 0105     Assessment: 71 YOF with right-breast cellulitis on vancomycin per pharmacy now to add Zosyn per pharmacy. Previously was receiving Rocephin but has since been discontinued and physician wants to broaden coverage in this diabetic female.   SCr is stable. CrCl~65-70 mL/min. WBC is wnl.  Plan for possible biopsy if no improvement- only slight decrease in redness per physician notes.  Blood cultures are ngtd.   Goal of Therapy:  Clinical resolution of infection  Plan:  1. Zosyn 3.375gm IV q8h - 4hr infusion. 2. Continue to monitor clinical improvement, renal function, and culture data.   Link Snuffer, PharmD, BCPS Clinical Pharmacist [phone] 08/21/2013,6:51 PM

## 2013-08-21 NOTE — Progress Notes (Addendum)
Occupational Therapy Treatment Patient Details Name: KHARTER SESTAK MRN: [id number] DOB: [birth year] Today's Date: 08/21/2013 Time: 0910-0940 OT Time Calculation (min): 30 min  OT Assessment / Plan / Recommendation  History of present illness Adm 08/18/13 due to weakness and inability to stand from her chair. Found to have cellulitis of Rt breast. PMHx includes bil TKR, COPD (uses 2L O2 at home), DM, sarcoid, morbid obesity, afib.   OT comments  Pt performed toileting tasks and grooming at sink as well as LB dressing. Pt fatigued during session. May need ST SNF prior to d/c home as pt lives alone, unless she can reach Mod I level for d/c. Pt is open to going to SNF if she has to.   Follow Up Recommendations  Home health OT;Supervision/Assistance - 24 hour    Barriers to Discharge       Equipment Recommendations  None recommended by OT    Recommendations for Other Services    Frequency Min 2X/week   Progress towards OT Goals Progress towards OT goals: Progressing toward goals  Plan Discharge plan remains appropriate    Precautions / Restrictions Precautions Precautions: Fall Precaution Comments: denies falls at home PTA Restrictions Weight Bearing Restrictions: No   Pertinent Vitals/Pain Pain 5-6/10. Repositioned.     ADL  Grooming: Performed;Wash/dry hands;Denture care;Wash/dry face;Supervision/safety;Set up Where Assessed - Grooming: Unsupported standing;Supported standing Lower Body Dressing: Performed;Min guard Where Assessed - Lower Body Dressing: Supported sit to stand Toilet Transfer: Hydrographic surveyor Method: Sit to Barista: Raised toilet seat with arms (or 3-in-1 over toilet) Toileting - Clothing Manipulation and Hygiene: Min guard Where Assessed - Toileting Clothing Manipulation and Hygiene: Sit to stand from 3-in-1 or toilet Equipment Used: Gait belt;Rolling walker;Other (comment) (O2) Transfers/Ambulation Related to ADLs: Min guard  for ambulation and transfers ADL Comments: Pt performed toileting tasks and then grooming tasks at sink. Left UE supported while washing face-standby assistance. Pt practiced donning/doffing mesh panties-cues for safety and technique as pt did not have walker in front before she was going to stand. Educated to take a break during task as pt was fatigued. Educated to stand in front of chair/bed when pulling up clothing. Pt ambulated in room towards end of session and was fatigued. OT explained concern with her being alone and wanting her safe. Pt did not feel need to practice tub transfer.    OT Diagnosis:    OT Problem List:   OT Treatment Interventions:     OT Goals(current goals can now be found in the care plan section) Acute Rehab OT Goals Patient Stated Goal: to go home OT Goal Formulation: With patient Time For Goal Achievement: 08/27/13 Potential to Achieve Goals: Good ADL Goals Pt Will Perform Grooming: with modified independence;standing Pt Will Perform Lower Body Bathing: with modified independence;sit to/from stand Pt Will Perform Lower Body Dressing: with modified independence;sit to/from stand Pt Will Transfer to Toilet: with modified independence;ambulating;grab bars (comfort height toilet) Pt Will Perform Toileting - Clothing Manipulation and hygiene: with modified independence;sit to/from stand Pt Will Perform Tub/Shower Transfer: Tub transfer;with supervision;ambulating;tub bench;rolling walker  Visit Information  Last OT Received On: 08/21/13 Assistance Needed: +1 History of Present Illness: Adm 08/18/13 due to weakness and inability to stand from her chair. Found to have cellulitis of Rt breast. PMHx includes bil TKR, COPD (uses 2L O2 at home), DM, sarcoid, morbid obesity, afib.    Subjective Data      Prior Functioning       Cognition  Cognition Arousal/Alertness: Awake/alert Behavior During Therapy: WFL for tasks assessed/performed Overall Cognitive Status:  Within Functional Limits for tasks assessed    Mobility  Bed Mobility Bed Mobility: Not assessed Transfers Transfers: Sit to Stand;Stand to Sit Sit to Stand: 4: Min guard;With upper extremity assist;From chair/3-in-1 Stand to Sit: 4: Min guard;With upper extremity assist;To chair/3-in-1 Details for Transfer Assistance: Min guard for safety.     Exercises      Balance     End of Session OT - End of Session Equipment Utilized During Treatment: Gait belt;Rolling walker;Oxygen Activity Tolerance: Patient tolerated treatment well;Patient limited by fatigue Patient left: in chair;with call bell/phone within reach Nurse Communication: Other (comment) (have nurse or tech walk with her)  Lorri Frederick OTR/L [phone] 08/21/2013, 1:46 PM

## 2013-08-21 NOTE — Progress Notes (Signed)
[facility] PROGRESS NOTE  [name] ION:[id number] DOB: [birth year] DOA: 08/18/2013 PCP: [name], MD  Assessment/Plan: Principal Problem:   Cellulitis of breast right Active Problems:   Atrial fibrillation   Breast cancer, stage 1 s/p rt lumpectomy and radiation 2012   COPD (chronic obstructive pulmonary disease)   Chronic respiratory failure with hypoxia   Weakness generalized    1. Cellulitis of breast right: Patient presented with generalized weakness, progressive redness, pain and swelling of right breast. Wcc was elevated at 16.9, but patient was apyrexial. CXR was devoid of acute finding, and U/S of affected breast showed diffuse breast parenchymal edema and skin thickening, worse in the lower outer quadrant. Managing with iv Vancomycin, now day #3. Clinical improvement has been tardy, but steady. Blood cultures are negative so far, and wcc has normalized. Will broaden coverage with Zosyn. The pain is to consider biopsy, to exclude inflammatory breast cancer. 2. Breast cancer: Patient has known history of stage 1 right breast cancer, s/p s/p right lumpectomy and radiation 2012. Patient will need bilateral diagnostic mammogram and right breast ultrasound, following a course of antibiotics.  Dr Milta Deiters, service, notified of hospitalization. 3. Atrial fibrillation: Continued on Metoprolol and Eliquis. Rate-controlled. Cardizem on hold, due to borderline SBP.   4. COPD (chronic obstructive pulmonary disease): Appears clinically stable. No evidence of exacerbation. Continue with albuterol PRN and Advair.  5. Diabetes: Managing with diet/SSI and Lantus, adjusted as indicated.  6. Chronic respiratory failure with hypoxia: Stable.  7. Weakness generalized: Secondary probably to acute illness. PT/OT ordered.  8. Thrombocytopenia: Mild/Stable. 9. Hypokalemia: Repleting as indicated.    Code Status: Full Code.  Family Communication:  Disposition Plan: To be determined.     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 Had last routine mammography last month, per patient's reports, normal. Comes in with generalized weakness today and rash and pain of right breast for the last 2 days. No fevers. No abd pain. No chest pain. No drainage from breast. No recent injury to breast. No cough. Chronically on oxygen at home for COPD at 2L [place]. No n/v/d. No chills. Admitted for further managment   Consultants:  N/A.   Procedures:  CXR  U/S Right Breast.   Antibiotics:  Vancomycin 08/18/13>>>  HPI/Subjective: Feels better.   Objective: Vital signs in last 24 hours: Temp:  [97.4 F (36.3 C)-98.1 F (36.7 C)] 97.7 F (36.5 C) (09/10 1400) Pulse Rate:  [78-82] 82 (09/10 1400) Resp:  [16-18] 18 (09/10 1400) BP: (129-148)/(68-75) 136/72 mmHg (09/10 1400) SpO2:  [97 %-99 %] 99 % (09/10 1400) FiO2 (%):  [98 %] 98 % (09/09 2005) Weight change:  Last BM Date: 08/21/13  Intake/Output from previous day: 09/09 0701 - 09/10 0700 In: -  Out: 350 [Urine:350] Total I/O In: 480 [P.O.:480] Out: -    Physical Exam: General: Comfortable, alert, communicative, fully oriented, not short of breath at rest.  HEENT:  No clinical pallor, no jaundice, no conjunctival injection or discharge. Hydration is fair.  NECK:  Supple, JVP not seen, no carotid bruits, no palpable lymphadenopathy, no palpable goiter. CHEST:  Clinically clear to auscultation, no wheezes, no crackles. Left breast is unremarkable. Right breast shows improvement of local inflammatory phenomena, particularly in the periphery, although there is still some induration and redness, centrally.  HEART:  Sounds 1 and 2 heard, normal, regular, no murmurs. ABDOMEN:  Obese, soft,  non-tender, no palpable organomegaly, no palpable masses, normal bowel sounds. GENITALIA:  Not examined. LOWER EXTREMITIES:  No pitting edema, palpable peripheral pulses. MUSCULOSKELETAL SYSTEM:  Generalized osteoarthritic changes, otherwise, normal. CENTRAL NERVOUS SYSTEM:  No focal neurologic deficit on gross examination.  Lab Results:  Recent Labs  08/20/13 1200 08/21/13 0550  WBC 7.5 6.8  HGB 12.3 12.4  HCT 39.0 40.0  PLT 117* 128*    Recent Labs  08/20/13 1200 08/21/13 0550  NA 140 141  K 3.3* 4.0  CL 100 103  CO2 30 31  GLUCOSE 225* 127*  BUN 11 11  CREATININE 0.70 0.67  CALCIUM 9.7 10.0   Recent Results (from the past 240 hour(s))  CULTURE, BLOOD (ROUTINE X 2)     Status: None   Collection Time    08/19/13 12:00 AM      Result Value Range Status   Specimen Description BLOOD LEFT ARM   Final   Special Requests BOTTLES DRAWN AEROBIC ONLY 9CC   Final   Culture  Setup Time     Final   Value: 08/19/2013 13:17     Performed at [facility]   Culture     Final   Value:        BLOOD CULTURE RECEIVED NO GROWTH TO DATE CULTURE WILL BE HELD FOR 5 DAYS BEFORE ISSUING A FINAL NEGATIVE REPORT     Performed at [facility]   Report Status PENDING   Incomplete  CULTURE, BLOOD (ROUTINE X 2)     Status: None   Collection Time    08/19/13 12:05 AM      Result Value Range Status   Specimen Description BLOOD LEFT HAND   Final   Special Requests BOTTLES DRAWN AEROBIC ONLY 10CC   Final   Culture  Setup Time     Final   Value: 08/19/2013 13:17     Performed at [facility]   Culture     Final   Value:        BLOOD CULTURE RECEIVED NO GROWTH TO DATE CULTURE WILL BE HELD FOR 5 DAYS BEFORE ISSUING A FINAL NEGATIVE REPORT     Performed at [facility]   Report Status PENDING   Incomplete     Studies/Results: No results found.  Medications: Scheduled Meds: . allopurinol  200 mg Oral Daily  . apixaban  5 mg Oral BID  . furosemide  20 mg Oral  q morning - 10a  . insulin aspart  0-9 Units Subcutaneous TID WC  . insulin glargine  15 Units Subcutaneous QHS  . metoprolol tartrate  25 mg Oral BID  . mometasone-formoterol  2 puff Inhalation BID  . sodium chloride  3 mL Intravenous Q12H  . vancomycin  1,000 mg Intravenous Q12H   Continuous Infusions:  PRN Meds:.sodium chloride, acetaminophen, acetaminophen, albuterol, ondansetron (ZOFRAN) IV, ondansetron, sodium chloride    LOS: 3 days   [name],CHRISTOPHER  [facility] Pager [phone]. If 8PM-8AM, please contact night-coverage at [email or web address], password [email or web address] 08/21/2013, 4:06 PM  LOS: 3 days

## 2013-08-22 LAB — BASIC METABOLIC PANEL
BUN: 14 mg/dL (ref 6–23)
CO2: 30 mEq/L (ref 19–32)
Chloride: 98 mEq/L (ref 96–112)
Creatinine, Ser: 0.67 mg/dL (ref 0.50–1.10)
GFR calc Af Amer: >90 mL/min (ref >90)

## 2013-08-22 LAB — CBC
HCT: 40.6 % (ref 36.0–46.0)
MCH: 24.3 pg — ABNORMAL LOW (ref 26.0–34.0)
MCV: 76.6 fL — ABNORMAL LOW (ref 78.0–100.0)
RDW: 18 % — ABNORMAL HIGH (ref 11.5–15.5)
WBC: 6 10*3/uL (ref 4.0–10.5)

## 2013-08-22 LAB — GLUCOSE, CAPILLARY
Glucose-Capillary: 143 mg/dL — ABNORMAL HIGH (ref 70–99)
Glucose-Capillary: 183 mg/dL — ABNORMAL HIGH (ref 70–99)

## 2013-08-22 MED ORDER — LORAZEPAM 0.5 MG PO TABS
0.5000 mg | ORAL_TABLET | Freq: Once | ORAL | Status: AC
  Administered 2013-08-22: 0.5 mg via ORAL
  Filled 2013-08-22: qty 1

## 2013-08-22 MED ORDER — DILTIAZEM HCL ER BEADS 240 MG PO CP24
360.0000 mg | ORAL_CAPSULE | Freq: Every day | ORAL | Status: DC
  Administered 2013-08-22 – 2013-08-26 (×5): 360 mg via ORAL
  Filled 2013-08-22 (×5): qty 1

## 2013-08-22 MED ORDER — DEXTROSE 5 % IV SOLN
1.0000 g | INTRAVENOUS | Status: DC
  Administered 2013-08-22 – 2013-08-24 (×3): 1 g via INTRAVENOUS
  Filled 2013-08-22 (×4): qty 10

## 2013-08-22 NOTE — Progress Notes (Signed)
ANTIBIOTIC CONSULT NOTE - Follow-Up  Pharmacy Consult for Zosyn and Vancomycin Indication: cellulitis  Allergies  Allergen Reactions  . Aspirin     Avoids due to being on blood thinners  . Contrast Media [Iodinated Diagnostic Agents] Nausea And Vomiting  . Ibuprofen     Avoids due to being on blood thinners  . Pravachol Other (See Comments)    Muscle pain  . Pravastatin Sodium   Patient Measurements: Height: 4' 11.06" (150 cm) Weight: 242 lb 4.6 oz (109.9 kg) IBW/kg (Calculated) : 43.33 Vital Signs: Temp: 98.1 F (36.7 C) (09/11 1005) Temp src: Oral (09/11 1005) BP: 120/53 mmHg (09/11 1005) Pulse Rate: 78 (09/11 1005) Intake/Output from previous day: 09/10 0701 - 09/11 0700 In: 720 [P.O.:720] Out: -  Intake/Output from this shift: Total I/O In: 240 [P.O.:240] Out: -  Labs:  Recent Labs  08/20/13 1200 08/21/13 0550 08/22/13 0605  WBC 7.5 6.8 6.0  HGB 12.3 12.4 12.9  PLT 117* 128* 138*  CREATININE 0.70 0.67 0.67   Estimated Creatinine Clearance: 69.1 ml/min (by C-G formula based on Cr of 0.67). Microbiology: Recent Results (from the past 720 hour(s))  CULTURE, BLOOD (ROUTINE X 2)     Status: None   Collection Time    08/19/13 12:00 AM      Result Value Range Status   Specimen Description BLOOD LEFT ARM   Final   Special Requests BOTTLES DRAWN AEROBIC ONLY 9CC   Final   Culture  Setup Time     Final   Value: 08/19/2013 13:17     Performed at [facility]   Culture     Final   Value:        BLOOD CULTURE RECEIVED NO GROWTH TO DATE CULTURE WILL BE HELD FOR 5 DAYS BEFORE ISSUING A FINAL NEGATIVE REPORT     Performed at [facility]   Report Status PENDING   Incomplete  CULTURE, BLOOD (ROUTINE X 2)     Status: None   Collection Time    08/19/13 12:05 AM      Result Value Range Status   Specimen Description BLOOD LEFT HAND   Final   Special Requests BOTTLES DRAWN AEROBIC ONLY 10CC   Final   Culture  Setup Time     Final   Value: 08/19/2013  13:17     Performed at [facility]   Culture     Final   Value:        BLOOD CULTURE RECEIVED NO GROWTH TO DATE CULTURE WILL BE HELD FOR 5 DAYS BEFORE ISSUING A FINAL NEGATIVE REPORT     Performed at [facility]   Report Status PENDING   Incomplete   Medications:  Anti-infectives   Start     Dose/Rate Route Frequency Ordered Stop   08/21/13 2200  piperacillin-tazobactam (ZOSYN) IVPB 3.375 g  Status:  Discontinued     3.375 g 12.5 mL/hr over 240 Minutes Intravenous 3 times per day 08/21/13 1856 08/21/13 1856   08/21/13 1900  piperacillin-tazobactam (ZOSYN) IVPB 3.375 g     3.375 g 12.5 mL/hr over 240 Minutes Intravenous Every 8 hours 08/21/13 1856     08/19/13 2200  cefTRIAXone (ROCEPHIN) 1 g in dextrose 5 % 50 mL IVPB  Status:  Discontinued     1 g 100 mL/hr over 30 Minutes Intravenous Every 24 hours 08/19/13 0152 08/19/13 1328   08/19/13 0600  vancomycin (VANCOCIN) IVPB 1000 mg/200 mL premix     1,000  mg 200 mL/hr over 60 Minutes Intravenous Every 12 hours 08/19/13 0231     08/19/13 0000  vancomycin (VANCOCIN) IVPB 1000 mg/200 mL premix     1,000 mg 200 mL/hr over 60 Minutes Intravenous  Once 08/18/13 2348 08/19/13 0208   08/19/13 0000  cefTRIAXone (ROCEPHIN) 1 g in dextrose 5 % 50 mL IVPB     1 g 100 mL/hr over 30 Minutes Intravenous  Once 08/18/13 2348 08/19/13 0105     Assessment: 71 YOF on Vancomycin Day #4 and Zosyn D#2 for right breast cellulitis, afebrile, WBC WNL. Consider biopsy to exclude inflamm breast ca. Consulted ID.  Vanc 9/8 >> CTX 9/8 >>9/10 Zosyn 9/10>>  9/8 Bld x2>>  Goal of Therapy:  Vancomycin trough 10-15 mcg/ml  Plan:  1. Zosyn 3.375gm IV q8h - 4hr infusion. 2. Vancomycin 1 gm IV q12h. 3. Will check vancomycin trough in a.m.  Christoper Fabian, PharmD, BCPS Clinical pharmacist, pager [phone] 08/22/2013,12:59 PM

## 2013-08-22 NOTE — Consult Note (Signed)
INFECTIOUS DISEASE CONSULT NOTE  Date of Admission:  08/18/2013  Date of Consult:  08/22/2013  Reason for Consult: Cellutlitis Referring Physician: Oti  Impression/Recommendation R breast cellulitis Breast CA DM  Would Continue vanco Change zosyn back to ceftriaxone She is improving, continue to watch her clinical exam.  Hopefully home by 7-10 days of therapy.   Comment- Explained to pt and son that as a result of her XRT and surgery, her lymphatic system does not work normally in her breast and she is more susceptible this type of infection. As well, she will have delayed healing.   Thank you so much for this interesting consult,   Johny Sax (pager) [phone] [email or web address]  [name] is an 72 y.o. female.  HPI: 72 yo F with hx of breast Cancer, s/p XRT and lumpectomy (2012) as well as DM. She did not have LN dissection.  She comes to [facility] on 9-8 with painful erythema over her R breast for 2 days. At home, she had no fever or chills, only weakness in trying to get out of a chair on the say of admission. In the ED, her WBC was 16.9 and she had temp 99.3. She was started on vanco/ceftriaxone. She improved in first 24h, ceftriaxone stopped, and had breast u/s showing diffuse edema and skin thickening (DDX mastitis, local CA).  She was felt to be responding slowly and zosyn was added to her coverage on 9-10. We are asked to eval for persistent cellulitis.   Past Medical History  Diagnosis Date  . Diabetes mellitus   . Asthma   . Bronchitis   . Hernia   . Anemia   . Uterine cancer     s/p hysterectomy  . Arthritis   . Hernia   . Morbid obesity   . Sarcoid   . OSA (obstructive sleep apnea)     Uses nasal CPAP Q HS  . Atrial fibrillation   . Breast cancer, stage 1 10/17/2011    s/p lumpectomy and XRT  . Cough   . Wheezing   . Chills   . Constipation   . Bruises easily   . Gout attack     ankle, then wrist and hands  . Hyperlipidemia     Past  Surgical History  Procedure Laterality Date  . Total knee arthroplasty  2001  . Abdominal hysterectomy  2006  . Resection of uterine cancer    . Left lower leg fx--casted    . Breast lumpectomy Right      Allergies  Allergen Reactions  . Aspirin     Avoids due to being on blood thinners  . Contrast Media [Iodinated Diagnostic Agents] Nausea And Vomiting  . Ibuprofen     Avoids due to being on blood thinners  . Pravachol Other (See Comments)    Muscle pain  . Pravastatin Sodium     Medications:  Scheduled: . allopurinol  200 mg Oral Daily  . apixaban  5 mg Oral BID  . diltiazem  360 mg Oral Daily  . furosemide  20 mg Oral q morning - 10a  . insulin aspart  0-9 Units Subcutaneous TID WC  . insulin glargine  15 Units Subcutaneous QHS  . metoprolol tartrate  25 mg Oral BID  . mometasone-formoterol  2 puff Inhalation BID  . piperacillin-tazobactam (ZOSYN)  IV  3.375 g Intravenous Q8H  . sodium chloride  3 mL Intravenous Q12H  . vancomycin  1,000 mg Intravenous Q12H    Total days  of antibiotics: vanco day 4, zosyn day 2.           Social History:  reports that she has never smoked. She has never used smokeless tobacco. She reports that she does not drink alcohol or use illicit drugs.  Family History  Problem Relation Age of Onset  . Diabetes Mother   . Cancer Father     colon  . Diabetes Brother   . Cancer Sister   . Diabetes Sister     General ROS: denies; dizziness, change in BM or urine. last XRT 2012. no port or catheter. +LE edema. no hx of LN dissection. see HPI.  Blood pressure 124/61, pulse 79, temperature 97.9 F (36.6 C), temperature source Oral, resp. rate 18, height 4' 11.06" (1.5 m), weight 109.9 kg (242 lb 4.6 oz), SpO2 99.00%. General appearance: alert, cooperative, no distress and examined with RN in room Eyes: negative findings: conjunctivae and sclerae normal and pupils equal, round, reactive to light and accomodation Throat: normal findings:  oropharynx pink & moist without lesions or evidence of thrush Neck: no adenopathy and supple, symmetrical, trachea midline Lungs: clear to auscultation bilaterally Breasts: R breast shows erythema. this has receded from the line of demarcation there are no paplable masses. there is no fluctuance. minimal tenderness. mild intertriginous erythema.  Heart: regular rate and rhythm Abdomen: normal findings: bowel sounds normal and soft, non-tender Extremities: edema 1-2+ LE Lymph nodes: Axillary adenopathy: none on R   Results for orders placed during the hospital encounter of 08/18/13 (from the past 48 hour(s))  GLUCOSE, CAPILLARY     Status: Abnormal   Collection Time    08/20/13  9:26 PM      Result Value Range   Glucose-Capillary 159 (*) 70 - 99 mg/dL   Comment 1 Notify RN    CBC     Status: Abnormal   Collection Time    08/21/13  5:50 AM      Result Value Range   WBC 6.8  4.0 - 10.5 K/uL   RBC 5.15 (*) 3.87 - 5.11 MIL/uL   Hemoglobin 12.4  12.0 - 15.0 g/dL   HCT 45.4  09.8 - 11.9 %   MCV 77.7 (*) 78.0 - 100.0 fL   MCH 24.1 (*) 26.0 - 34.0 pg   MCHC 31.0  30.0 - 36.0 g/dL   RDW 14.7 (*) 82.9 - 56.2 %   Platelets 128 (*) 150 - 400 K/uL  BASIC METABOLIC PANEL     Status: Abnormal   Collection Time    08/21/13  5:50 AM      Result Value Range   Sodium 141  135 - 145 mEq/L   Potassium 4.0  3.5 - 5.1 mEq/L   Comment: NO VISIBLE HEMOLYSIS   Chloride 103  96 - 112 mEq/L   CO2 31  19 - 32 mEq/L   Glucose, Bld 127 (*) 70 - 99 mg/dL   BUN 11  6 - 23 mg/dL   Creatinine, Ser 1.30  0.50 - 1.10 mg/dL   Calcium 86.5  8.4 - 78.4 mg/dL   GFR calc non Af Amer 85 (*) >90 mL/min   GFR calc Af Amer >90  >90 mL/min   Comment: (NOTE)     The eGFR has been calculated using the CKD EPI equation.     This calculation has not been validated in all clinical situations.     eGFR's persistently <90 mL/min signify possible Chronic Kidney     Disease.  GLUCOSE, CAPILLARY     Status: Abnormal    Collection Time    08/21/13  7:39 AM      Result Value Range   Glucose-Capillary 134 (*) 70 - 99 mg/dL  GLUCOSE, CAPILLARY     Status: Abnormal   Collection Time    08/21/13 11:38 AM      Result Value Range   Glucose-Capillary 172 (*) 70 - 99 mg/dL   Comment 1 Notify RN    GLUCOSE, CAPILLARY     Status: Abnormal   Collection Time    08/21/13  5:19 PM      Result Value Range   Glucose-Capillary 133 (*) 70 - 99 mg/dL  GLUCOSE, CAPILLARY     Status: Abnormal   Collection Time    08/21/13  9:36 PM      Result Value Range   Glucose-Capillary 136 (*) 70 - 99 mg/dL  CBC     Status: Abnormal   Collection Time    08/22/13  6:05 AM      Result Value Range   WBC 6.0  4.0 - 10.5 K/uL   RBC 5.30 (*) 3.87 - 5.11 MIL/uL   Hemoglobin 12.9  12.0 - 15.0 g/dL   HCT 14.7  82.9 - 56.2 %   MCV 76.6 (*) 78.0 - 100.0 fL   MCH 24.3 (*) 26.0 - 34.0 pg   MCHC 31.8  30.0 - 36.0 g/dL   RDW 13.0 (*) 86.5 - 78.4 %   Platelets 138 (*) 150 - 400 K/uL  BASIC METABOLIC PANEL     Status: Abnormal   Collection Time    08/22/13  6:05 AM      Result Value Range   Sodium 139  135 - 145 mEq/L   Potassium 3.8  3.5 - 5.1 mEq/L   Chloride 98  96 - 112 mEq/L   CO2 30  19 - 32 mEq/L   Glucose, Bld 119 (*) 70 - 99 mg/dL   BUN 14  6 - 23 mg/dL   Creatinine, Ser 6.96  0.50 - 1.10 mg/dL   Calcium 29.5  8.4 - 28.4 mg/dL   GFR calc non Af Amer 85 (*) >90 mL/min   GFR calc Af Amer >90  >90 mL/min   Comment: (NOTE)     The eGFR has been calculated using the CKD EPI equation.     This calculation has not been validated in all clinical situations.     eGFR's persistently <90 mL/min signify possible Chronic Kidney     Disease.  GLUCOSE, CAPILLARY     Status: Abnormal   Collection Time    08/22/13  7:26 AM      Result Value Range   Glucose-Capillary 143 (*) 70 - 99 mg/dL  GLUCOSE, CAPILLARY     Status: Abnormal   Collection Time    08/22/13 11:15 AM      Result Value Range   Glucose-Capillary 222 (*) 70 - 99  mg/dL   Comment 1 Notify RN    GLUCOSE, CAPILLARY     Status: Abnormal   Collection Time    08/22/13  4:56 PM      Result Value Range   Glucose-Capillary 143 (*) 70 - 99 mg/dL   Comment 1 Notify RN        Component Value Date/Time   SDES BLOOD LEFT HAND 08/19/2013 0005   SPECREQUEST BOTTLES DRAWN AEROBIC ONLY 10CC 08/19/2013 0005   CULT  Value:  BLOOD CULTURE RECEIVED NO GROWTH TO DATE CULTURE WILL BE HELD FOR 5 DAYS BEFORE ISSUING A FINAL NEGATIVE REPORT Performed at [facility] 08/19/2013 0005   REPTSTATUS PENDING 08/19/2013 0005   No results found. Recent Results (from the past 240 hour(s))  CULTURE, BLOOD (ROUTINE X 2)     Status: None   Collection Time    08/19/13 12:00 AM      Result Value Range Status   Specimen Description BLOOD LEFT ARM   Final   Special Requests BOTTLES DRAWN AEROBIC ONLY 9CC   Final   Culture  Setup Time     Final   Value: 08/19/2013 13:17     Performed at [facility]   Culture     Final   Value:        BLOOD CULTURE RECEIVED NO GROWTH TO DATE CULTURE WILL BE HELD FOR 5 DAYS BEFORE ISSUING A FINAL NEGATIVE REPORT     Performed at [facility]   Report Status PENDING   Incomplete  CULTURE, BLOOD (ROUTINE X 2)     Status: None   Collection Time    08/19/13 12:05 AM      Result Value Range Status   Specimen Description BLOOD LEFT HAND   Final   Special Requests BOTTLES DRAWN AEROBIC ONLY 10CC   Final   Culture  Setup Time     Final   Value: 08/19/2013 13:17     Performed at [facility]   Culture     Final   Value:        BLOOD CULTURE RECEIVED NO GROWTH TO DATE CULTURE WILL BE HELD FOR 5 DAYS BEFORE ISSUING A FINAL NEGATIVE REPORT     Performed at [facility]   Report Status PENDING   Incomplete      08/22/2013, 5:11 PM     LOS: 4 days

## 2013-08-22 NOTE — Progress Notes (Signed)
[facility] PROGRESS NOTE  [name] RUE:[id number] DOB: Jul 11, 1941 DOA: 08/18/2013 PCP: [name], MD  Assessment/Plan: Principal Problem:   Cellulitis of breast right Active Problems:   Atrial fibrillation   Breast cancer, stage 1 s/p rt lumpectomy and radiation 2012   COPD (chronic obstructive pulmonary disease)   Chronic respiratory failure with hypoxia   Weakness generalized    1. Cellulitis of breast right: Patient presented with generalized weakness, progressive redness, pain and swelling of right breast. Wcc was elevated at 16.9, but patient was apyrexial. CXR was devoid of acute finding, and U/S of affected breast showed diffuse breast parenchymal edema and skin thickening, worse in the lower outer quadrant. Managing with iv Vancomycin, day #3/Zosyn, day #2. Clinical improvement has been tardy, but steady. Blood cultures are negative so far, and wcc has normalized. The plan is to consider biopsy, to exclude inflammatory breast cancer. Hatcher consulted Dr Ninetta Lights, ID, to assist.  2. Breast cancer: Patient has known history of stage 1 right breast cancer, s/p right lumpectomy and radiation 2012. Patient will need bilateral diagnostic mammogram and right breast ultrasound, following a course of antibiotics.  Dr Milta Deiters, service, notified of hospitalization. 3. Atrial fibrillation: Continued on Metoprolol and Eliquis. Rate-controlled. Cardizem was placed temporarily on hold, due to borderline SBP. As BP has normalized, will reinstate today.   4. COPD (chronic obstructive pulmonary disease): Appears clinically stable. No evidence of exacerbation. Continue with albuterol PRN and Advair.  5. Diabetes: Managing with diet/SSI and Lantus, adjusted as indicated, with reasonable control.  6. Chronic respiratory failure with hypoxia: Stable.  7. Weakness generalized: Secondary probably to acute illness. PT/OT has evaluated, and recommended SNF vs HHPT/OT with 24 hour supervision.    8. Thrombocytopenia: Mild/Stable. Improved, and almost normalized. 9. Hypokalemia: Repleting as indicated/Resolved.    Code Status: Full Code.  Family Communication:  Disposition Plan: To be determined.    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 Had last routine mammography last month, per patient's reports, normal. Comes in with generalized weakness today and rash and pain of right breast for the last 2 days. No fevers. No abd pain. No chest pain. No drainage from breast. No recent injury to breast. No cough. Chronically on oxygen at home for COPD at 2L [place]. No n/v/d. No chills. Admitted for further managment   Consultants:  N/A.   Procedures:  CXR  U/S Right Breast.   Antibiotics:  Vancomycin 08/18/13>>>  Zosyn 08/21/13>>>  HPI/Subjective: No new issues.   Objective: Vital signs in last 24 hours: Temp:  [97.7 F (36.5 C)-98.1 F (36.7 C)] 98.1 F (36.7 C) (09/11 0558) Pulse Rate:  [71-82] 71 (09/11 0558) Resp:  [18] 18 (09/11 0558) BP: (132-136)/(72-79) 136/79 mmHg (09/11 0558) SpO2:  [98 %-99 %] 99 % (09/11 0558) Weight change:  Last BM Date: 08/21/13  Intake/Output from previous day: 09/10 0701 - 09/11 0700 In: 720 [P.O.:720] Out: -  Total I/O In: 240 [P.O.:240] Out: -    Physical Exam: General: Comfortable, alert, communicative, fully oriented, not short of breath at rest.  HEENT:  No clinical pallor, no jaundice, no conjunctival injection or discharge. Hydration is fair.  NECK:  Supple, JVP not seen, no carotid bruits, no palpable lymphadenopathy, no palpable goiter. CHEST:  Clinically clear to auscultation, no wheezes, no  crackles. Left breast is unremarkable. Right breast shows improvement of local inflammatory  phenomena, particularly in the periphery, although there is still some induration and redness, centrally.  HEART:  Sounds 1 and 2 heard, normal, regular, no murmurs. ABDOMEN:  Obese, soft, non-tender, no palpable organomegaly, no palpable masses, normal bowel sounds. GENITALIA:  Not examined. LOWER EXTREMITIES:  No pitting edema, palpable peripheral pulses. MUSCULOSKELETAL SYSTEM:  Generalized osteoarthritic changes, otherwise, normal. CENTRAL NERVOUS SYSTEM:  No focal neurologic deficit on gross examination.  Lab Results:  Recent Labs  08/21/13 0550 08/22/13 0605  WBC 6.8 6.0  HGB 12.4 12.9  HCT 40.0 40.6  PLT 128* 138*    Recent Labs  08/21/13 0550 08/22/13 0605  NA 141 139  K 4.0 3.8  CL 103 98  CO2 31 30  GLUCOSE 127* 119*  BUN 11 14  CREATININE 0.67 0.67  CALCIUM 10.0 10.3   Recent Results (from the past 240 hour(s))  CULTURE, BLOOD (ROUTINE X 2)     Status: None   Collection Time    08/19/13 12:00 AM      Result Value Range Status   Specimen Description BLOOD LEFT ARM   Final   Special Requests BOTTLES DRAWN AEROBIC ONLY 9CC   Final   Culture  Setup Time     Final   Value: 08/19/2013 13:17     Performed at [facility]   Culture     Final   Value:        BLOOD CULTURE RECEIVED NO GROWTH TO DATE CULTURE WILL BE HELD FOR 5 DAYS BEFORE ISSUING A FINAL NEGATIVE REPORT     Performed at [facility]   Report Status PENDING   Incomplete  CULTURE, BLOOD (ROUTINE X 2)     Status: None   Collection Time    08/19/13 12:05 AM      Result Value Range Status   Specimen Description BLOOD LEFT HAND   Final   Special Requests BOTTLES DRAWN AEROBIC ONLY 10CC   Final   Culture  Setup Time     Final   Value: 08/19/2013 13:17     Performed at [facility]   Culture     Final   Value:        BLOOD CULTURE RECEIVED NO GROWTH TO DATE CULTURE WILL BE HELD FOR 5 DAYS BEFORE ISSUING A FINAL NEGATIVE REPORT     Performed at [facility]   Report  Status PENDING   Incomplete     Studies/Results: No results found.  Medications: Scheduled Meds: . allopurinol  200 mg Oral Daily  . apixaban  5 mg Oral BID  . furosemide  20 mg Oral q morning - 10a  . influenza vac split quadrivalent PF  0.5 mL Intramuscular Tomorrow-1000  . insulin aspart  0-9 Units Subcutaneous TID WC  . insulin glargine  15 Units Subcutaneous QHS  . metoprolol tartrate  25 mg Oral BID  . mometasone-formoterol  2 puff Inhalation BID  . piperacillin-tazobactam (ZOSYN)  IV  3.375 g Intravenous Q8H  . sodium chloride  3 mL Intravenous Q12H  . vancomycin  1,000 mg Intravenous Q12H   Continuous Infusions:  PRN Meds:.sodium chloride, acetaminophen, acetaminophen, albuterol, ondansetron (ZOFRAN) IV, ondansetron, sodium chloride    LOS: 4 days   Evea Sheek,CHRISTOPHER  [facility] Pager [phone]. If 8PM-8AM, please contact night-coverage at [email or web address], password [facility] 08/22/2013, 10:24 AM  LOS: 4 days

## 2013-08-23 DIAGNOSIS — Z853 Personal history of malignant neoplasm of breast: Secondary | ICD-10-CM

## 2013-08-23 LAB — GLUCOSE, CAPILLARY
Glucose-Capillary: 147 mg/dL — ABNORMAL HIGH (ref 70–99)
Glucose-Capillary: 170 mg/dL — ABNORMAL HIGH (ref 70–99)

## 2013-08-23 LAB — BASIC METABOLIC PANEL
CO2: 32 mEq/L (ref 19–32)
GFR calc non Af Amer: 82 mL/min — ABNORMAL LOW (ref >90)
Glucose, Bld: 127 mg/dL — ABNORMAL HIGH (ref 70–99)
Potassium: 3.9 mEq/L (ref 3.5–5.1)
Sodium: 139 mEq/L (ref 135–145)

## 2013-08-23 LAB — CBC
Hemoglobin: 12.7 g/dL (ref 12.0–15.0)
Platelets: 142 10*3/uL — ABNORMAL LOW (ref 150–400)
RBC: 5.22 MIL/uL — ABNORMAL HIGH (ref 3.87–5.11)
WBC: 5 10*3/uL (ref 4.0–10.5)

## 2013-08-23 LAB — VANCOMYCIN, TROUGH: Vancomycin Tr: 18.5 ug/mL (ref 10.0–20.0)

## 2013-08-23 MED ORDER — LORAZEPAM 0.5 MG PO TABS
0.5000 mg | ORAL_TABLET | Freq: Every evening | ORAL | Status: DC | PRN
  Administered 2013-08-23 – 2013-08-26 (×4): 0.5 mg via ORAL
  Filled 2013-08-23 (×4): qty 1

## 2013-08-23 MED ORDER — TRAMADOL HCL 50 MG PO TABS
25.0000 mg | ORAL_TABLET | Freq: Three times a day (TID) | ORAL | Status: DC
  Administered 2013-08-23 – 2013-08-26 (×11): 25 mg via ORAL
  Filled 2013-08-23: qty 1
  Filled 2013-08-23: qty 2
  Filled 2013-08-23 (×5): qty 1
  Filled 2013-08-23: qty 2
  Filled 2013-08-23: qty 1
  Filled 2013-08-23: qty 2
  Filled 2013-08-23: qty 1

## 2013-08-23 MED ORDER — BISACODYL 5 MG PO TBEC
10.0000 mg | DELAYED_RELEASE_TABLET | Freq: Once | ORAL | Status: AC
  Administered 2013-08-23: 10 mg via ORAL
  Filled 2013-08-23: qty 2

## 2013-08-23 MED ORDER — VANCOMYCIN HCL IN DEXTROSE 750-5 MG/150ML-% IV SOLN
750.0000 mg | Freq: Two times a day (BID) | INTRAVENOUS | Status: DC
  Administered 2013-08-23 – 2013-08-25 (×5): 750 mg via INTRAVENOUS
  Filled 2013-08-23 (×6): qty 150

## 2013-08-23 NOTE — Clinical Social Work Placement (Addendum)
Clinical Social Work Department CLINICAL SOCIAL WORK PLACEMENT NOTE 08/23/2013  Patient:  [name], [name]  Account Number:  [id number] Admit date:  08/18/2013  Clinical Social Worker:  Rozetta Nunnery, Theresia Majors  Date/time:  08/23/2013 12:04 PM  Clinical Social Work is seeking post-discharge placement for this patient at the following level of care:   SKILLED NURSING   (*CSW will update this form in Epic as items are completed)   08/23/2013  Patient/family provided with [facility] Department of Clinical Social Work's list of facilities offering this level of care within the geographic area requested by the patient (or if unable, by the patient's family).  08/23/2013  Patient/family informed of their freedom to choose among providers that offer the needed level of care, that participate in Medicare, Medicaid or managed care program needed by the patient, have an available bed and are willing to accept the patient.  08/23/2013  Patient/family informed of [facility]' ownership interest in [facility], as well as of the fact that they are under no obligation to receive care at this facility.  PASARR submitted to [employer] on Existing PASARR number received from [employer] on [phone] [id number]  FL2 transmitted to all facilities in geographic area requested by pt/family on  08/23/2013 FL2 transmitted to all facilities within larger geographic area on   Patient informed that his/her managed care company has contracts with or will negotiate with  certain facilities, including the following:     Patient/family informed of bed offers received:  08/24/2013 Patient chooses bed at  [facility] Physician recommends and patient chooses bed at    Patient to be transferred to [facility] on  08/26/2013   Patient to be transferred to facility by  Ambulance   The following physician request were entered in Epic:   Additional Comments:

## 2013-08-23 NOTE — Progress Notes (Signed)
[facility] PROGRESS NOTE  [name] JXB:[id number] DOB: 1941-01-01 DOA: 08/18/2013 PCP: [name], MD  Assessment/Plan: Principal Problem:   Cellulitis of breast right Active Problems:   Atrial fibrillation   Breast cancer, stage 1 s/p rt lumpectomy and radiation 2012   COPD (chronic obstructive pulmonary disease)   Chronic respiratory failure with hypoxia   Weakness generalized    1. Cellulitis of breast right: Patient presented with generalized weakness, progressive redness, pain and swelling of right breast. Wcc was elevated at 16.9, but patient was apyrexial. CXR was devoid of acute finding, and U/S of affected breast showed diffuse breast parenchymal edema and skin thickening, worse in the lower outer quadrant. Managed with iv Vancomycin initially, Vancomycin was added on 08/21/13. Blood cultures are negative so far, and wcc has normalized. Dr Johny Sax provided ID, consultation, and has recommended switching to Vancomycin/Rocephin (Day #6 Abx). This was implemented on 08/22/13, and clinically, improvement has been gradual, but visible.  2. Breast cancer: Patient has known history of stage 1 right breast cancer, s/p right lumpectomy and radiation 2012. Patient will need bilateral diagnostic mammogram and right breast ultrasound, following a course of antibiotics.  Dr Milta Deiters, service, notified of hospitalization. 3. Atrial fibrillation: Continued on Metoprolol and Eliquis. Rate-controlled. Cardizem was placed temporarily on hold, due to borderline SBP, but as BP had normalized on 08/22/13, Cardizem was reinsated on that date, without deleterious effect.   4. COPD (chronic obstructive pulmonary disease): Appears clinically stable. No evidence of exacerbation. Continue with albuterol PRN and Advair.  5. Diabetes: Managing with diet/SSI and Lantus, adjusted as indicated, with reasonable control.  6. Chronic respiratory failure with hypoxia: Stable.  7. Weakness  generalized: Secondary probably to acute illness. PT/OT has evaluated, and recommended SNF vs HHPT/OT with 24 hour supervision.   8. Thrombocytopenia: Mild/Stable. Improved, and now practically normalized. 9. Hypokalemia: Repleted as indicated/Resolved.    Code Status: Full Code.  Family Communication:  Disposition Plan: To be determined.    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 Had last routine mammography last month, per patient's reports, normal. Comes in with generalized weakness today and rash and pain of right breast for the last 2 days. No fevers. No abd pain. No chest pain. No drainage from breast. No recent injury to breast. No cough. Chronically on oxygen at home for COPD at 2L [place]. No n/v/d. No chills. Admitted for further managment   Consultants:  N/A.   Procedures:  CXR  U/S Right Breast.   Antibiotics:  Vancomycin 08/18/13>>>  Zosyn 08/21/13>>>  HPI/Subjective: C/O Bilateral leg pains.   Objective: Vital signs in last 24 hours: Temp:  [97.4 F (36.3 C)-97.9 F (36.6 C)] 97.4 F (36.3 C) (09/12 0527) Pulse Rate:  [78-80] 80 (09/12 0527) Resp:  [18-20] 20 (09/12 0527) BP: (108-126)/(61-69) 126/69 mmHg (09/12 0527) SpO2:  [92 %-99 %] 92 % (09/12 0826) Weight change:  Last BM Date: 08/22/13  Intake/Output from previous day: 09/11 0701 - 09/12 0700 In: 480 [P.O.:480] Out: -      Physical Exam: General: Comfortable, alert, communicative, fully oriented, not short of breath at rest.  HEENT:  No clinical pallor, no jaundice, no conjunctival injection or discharge. Hydration is fair.  NECK:  Supple, JVP not seen, no carotid bruits, no palpable lymphadenopathy, no palpable  goiter. CHEST:  Clinically clear to auscultation,  no wheezes, no crackles. Left breast is unremarkable. Right breast shows improvement of local inflammatory phenomena, particularly in the periphery, although there is still some induration and redness, centrally.  HEART:  Sounds 1 and 2 heard, normal, regular, no murmurs. ABDOMEN:  Obese, soft, non-tender, no palpable organomegaly, no palpable masses, normal bowel sounds. GENITALIA:  Not examined. LOWER EXTREMITIES:  No pitting edema, palpable peripheral pulses. Has extensive varicosities.  MUSCULOSKELETAL SYSTEM:  Generalized osteoarthritic changes, otherwise, normal. CENTRAL NERVOUS SYSTEM:  No focal neurologic deficit on gross examination.  Lab Results:  Recent Labs  08/22/13 0605 08/23/13 0518  WBC 6.0 5.0  HGB 12.9 12.7  HCT 40.6 39.7  PLT 138* 142*    Recent Labs  08/22/13 0605 08/23/13 0518  NA 139 139  K 3.8 3.9  CL 98 98  CO2 30 32  GLUCOSE 119* 127*  BUN 14 16  CREATININE 0.67 0.76  CALCIUM 10.3 10.4   Recent Results (from the past 240 hour(s))  CULTURE, BLOOD (ROUTINE X 2)     Status: None   Collection Time    08/19/13 12:00 AM      Result Value Range Status   Specimen Description BLOOD LEFT ARM   Final   Special Requests BOTTLES DRAWN AEROBIC ONLY 9CC   Final   Culture  Setup Time     Final   Value: 08/19/2013 13:17     Performed at [facility]   Culture     Final   Value:        BLOOD CULTURE RECEIVED NO GROWTH TO DATE CULTURE WILL BE HELD FOR 5 DAYS BEFORE ISSUING A FINAL NEGATIVE REPORT     Performed at [facility]   Report Status PENDING   Incomplete  CULTURE, BLOOD (ROUTINE X 2)     Status: None   Collection Time    08/19/13 12:05 AM      Result Value Range Status   Specimen Description BLOOD LEFT HAND   Final   Special Requests BOTTLES DRAWN AEROBIC ONLY 10CC   Final   Culture  Setup Time     Final   Value: 08/19/2013 13:17     Performed at [facility]   Culture     Final   Value:        BLOOD CULTURE RECEIVED NO  GROWTH TO DATE CULTURE WILL BE HELD FOR 5 DAYS BEFORE ISSUING A FINAL NEGATIVE REPORT     Performed at [facility]   Report Status PENDING   Incomplete     Studies/Results: No results found.  Medications: Scheduled Meds: . allopurinol  200 mg Oral Daily  . apixaban  5 mg Oral BID  . cefTRIAXone (ROCEPHIN)  IV  1 g Intravenous Q24H  . diltiazem  360 mg Oral Daily  . furosemide  20 mg Oral q morning - 10a  . insulin aspart  0-9 Units Subcutaneous TID WC  . insulin glargine  15 Units Subcutaneous QHS  . metoprolol tartrate  25 mg Oral BID  . mometasone-formoterol  2 puff Inhalation BID  . sodium chloride  3 mL Intravenous Q12H  . vancomycin  750 mg Intravenous Q12H   Continuous Infusions:  PRN Meds:.sodium chloride, acetaminophen, acetaminophen, albuterol, LORazepam, ondansetron (ZOFRAN) IV, ondansetron, sodium chloride    LOS: 5 days   Benjiman Sedgwick,CHRISTOPHER  [facility] Pager [phone]. If 8PM-8AM, please contact night-coverage at [email or web address], password [facility] 08/23/2013, 10:22 AM  LOS:  5 days

## 2013-08-23 NOTE — Progress Notes (Signed)
Physical Therapy Treatment Patient Details Name: [name] MRN: [id number] DOB: 1941/05/27 Today's Date: 08/23/2013 Time: 4098-1191 PT Time Calculation (min): 25 min  PT Assessment / Plan / Recommendation  History of Present Illness Adm 08/18/13 due to weakness and inability to stand from her chair. Found to have cellulitis of Rt breast. PMHx includes bil TKR, COPD (uses 2L O2 at home), DM, sarcoid, morbid obesity, afib.   PT Comments   Pt cont's to be limited by decreased activity tolerance & weakness.  Pt lives alone therefore still strongly recommend SNF to ensure safe d/c home.  Pt states she is agreeable to this but is requesting for [facility].       Follow Up Recommendations  SNF;Supervision/Assistance - 24 hour;Other (comment)     Does the patient have the potential to tolerate intense rehabilitation     Barriers to Discharge        Equipment Recommendations  None recommended by PT    Recommendations for Other Services    Frequency Min 3X/week   Progress towards PT Goals Progress towards PT goals: Progressing toward goals  Plan      Precautions / Restrictions Precautions Precautions: Fall Precaution Comments: denies falls at home PTA Restrictions Weight Bearing Restrictions: No   Pertinent Vitals/Pain No pain indicated.      Mobility  Bed Mobility Bed Mobility: Not assessed Transfers Transfers: Sit to Stand;Stand to Sit Sit to Stand: 4: Min guard;With upper extremity assist;With armrests;From chair/3-in-1;4: Min assist Stand to Sit: 4: Min guard;With upper extremity assist;With armrests;To chair/3-in-1 Details for Transfer Assistance: Min assist to achieve standing from recliner & min guard to stand from 3-in-1.   Ambulation/Gait Ambulation/Gait Assistance: 4: Min guard Ambulation Distance (Feet): 30 Feet Assistive device: Rolling walker Ambulation/Gait Assistance Details: cues for posture & to stay closer to RW.   Gait Pattern: Step-through  pattern;Decreased stride length;Trunk flexed General Gait Details: pt amb on 3L O2; demo SOB at end of session Stairs: No Wheelchair Mobility Wheelchair Mobility: No    Exercises General Exercises - Lower Extremity Ankle Circles/Pumps: Both;10 reps Long Arc Quad: Strengthening;Both;10 reps Hip Flexion/Marching: Strengthening;Both;10 reps Toe Raises: Both;10 reps Heel Raises: Both;10 reps     PT Goals (current goals can now be found in the care plan section) Acute Rehab PT Goals Patient Stated Goal: to go home PT Goal Formulation: With patient Time For Goal Achievement: 08/27/13 Potential to Achieve Goals: Good  Visit Information  Last PT Received On: 08/23/13 Assistance Needed: +1 History of Present Illness: Adm 08/18/13 due to weakness and inability to stand from her chair. Found to have cellulitis of Rt breast. PMHx includes bil TKR, COPD (uses 2L O2 at home), DM, sarcoid, morbid obesity, afib.    Subjective Data  Patient Stated Goal: to go home   Cognition  Cognition Arousal/Alertness: Awake/alert Behavior During Therapy: WFL for tasks assessed/performed Overall Cognitive Status: Within Functional Limits for tasks assessed    Balance     End of Session PT - End of Session Equipment Utilized During Treatment: Gait belt;Oxygen Activity Tolerance: Patient tolerated treatment well Patient left: in chair;with call bell/phone within reach Nurse Communication: Mobility status   GP     Lara Mulch 08/23/2013, 12:57 PM   Verdell Face, PTA [phone] 08/23/2013

## 2013-08-23 NOTE — Clinical Social Work Note (Signed)
Clinical Social Work Department BRIEF PSYCHOSOCIAL ASSESSMENT 08/23/2013  Patient:  [name], [name]     Account Number:  [id number]     Admit date:  08/18/2013  Clinical Social Worker:  Hulan Fray  Date/Time:  08/23/2013 12:00 N  Referred by:  CSW  Date Referred:  08/23/2013 Referred for  SNF Placement   Other Referral:   Interview type:  Patient Other interview type:    PSYCHOSOCIAL DATA Living Status:  ALONE Admitted from facility:   Level of care:   Primary support name:  Ethelene Browns and Donald Prose Primary support relationship to patient:  CHILD, ADULT Degree of support available:   supportive    CURRENT CONCERNS Current Concerns  Post-Acute Placement   Other Concerns:    SOCIAL WORK ASSESSMENT / PLAN Clinical Social Worker reviewed chart and noticed PT/OT recommendation for possible need for short term SNF placement at discharge. CSW introduced self and explained reason for visit. CSW provided SNF packet. Patient was agreeable for CSW to initiate SNF search with preference for [facility]. Patient reported that she has been to [facility] three other times and was very pleased. CSW will initiate [facility] search and complete FL2 for MD to sign.    CSW will update patient when bed offers are made.   Assessment/plan status:  Psychosocial Support/Ongoing Assessment of Needs Other assessment/ plan:   Information/referral to community resources:   SNF packet    PATIENT'S/FAMILY'S RESPONSE TO PLAN OF CARE: Patient was agreeable for CSW to initiate SNF search in [facility], with preference for [facility]. Patient was appreciative of CSW's visit and assistance.

## 2013-08-23 NOTE — Progress Notes (Signed)
ANTIBIOTIC CONSULT NOTE - FOLLOW UP  Pharmacy Consult for vancomycin Indication: cellulitis  Labs:  Recent Labs  08/21/13 0550 08/22/13 0605 08/23/13 0518  WBC 6.8 6.0 5.0  HGB 12.4 12.9 12.7  PLT 128* 138* 142*  CREATININE 0.67 0.67 0.76   Estimated Creatinine Clearance: 69.1 ml/min (by C-G formula based on Cr of 0.76).  Recent Labs  08/23/13 0518  VANCOTROUGH 18.5       Assessment: 71yo female supratherapeutic on vancomycin for cellulitis.  Goal of Therapy:  Vancomycin trough level 10-15 mcg/ml  Plan:  Will change vancomycin to 750mg  IV Q12H and continue to monitor.  Vernard Gambles, PharmD, BCPS  08/23/2013,6:32 AM

## 2013-08-23 NOTE — Progress Notes (Signed)
CRITICAL VALUE ALERT  Critical value received:  Gram positive cocci in clusters from aerobic bottle (blood)  Date of notification:  08/23/2013  Time of notification:  22:45  Critical value read back:yes  Nurse who received alert: Clovis Riley)  MD notified (1st page):  2320  Time of first page:  22:30  MD notified (2nd page):  Time of second page:  Responding MD: Dr. Clearence Ped  Time MD responded:  2330

## 2013-08-23 NOTE — Progress Notes (Signed)
INFECTIOUS DISEASE PROGRESS NOTE  ID: [name] is a 72 y.o. female with  Principal Problem:   Cellulitis of breast right Active Problems:   Atrial fibrillation   Breast cancer, stage 1 s/p rt lumpectomy and radiation 2012   COPD (chronic obstructive pulmonary disease)   Chronic respiratory failure with hypoxia   Weakness generalized  Subjective: Without complaints. Feels better.   Abtx:  Anti-infectives   Start     Dose/Rate Route Frequency Ordered Stop   08/23/13 1000  vancomycin (VANCOCIN) IVPB 750 mg/150 ml premix     750 mg 150 mL/hr over 60 Minutes Intravenous Every 12 hours 08/23/13 0632     08/22/13 1800  cefTRIAXone (ROCEPHIN) 1 g in dextrose 5 % 50 mL IVPB     1 g 100 mL/hr over 30 Minutes Intravenous Every 24 hours 08/22/13 1742     08/21/13 2200  piperacillin-tazobactam (ZOSYN) IVPB 3.375 g  Status:  Discontinued     3.375 g 12.5 mL/hr over 240 Minutes Intravenous 3 times per day 08/21/13 1856 08/21/13 1856   08/21/13 1900  piperacillin-tazobactam (ZOSYN) IVPB 3.375 g  Status:  Discontinued     3.375 g 12.5 mL/hr over 240 Minutes Intravenous Every 8 hours 08/21/13 1856 08/22/13 1742   08/19/13 2200  cefTRIAXone (ROCEPHIN) 1 g in dextrose 5 % 50 mL IVPB  Status:  Discontinued     1 g 100 mL/hr over 30 Minutes Intravenous Every 24 hours 08/19/13 0152 08/19/13 1328   08/19/13 0600  vancomycin (VANCOCIN) IVPB 1000 mg/200 mL premix  Status:  Discontinued     1,000 mg 200 mL/hr over 60 Minutes Intravenous Every 12 hours 08/19/13 0231 08/23/13 0631   08/19/13 0000  vancomycin (VANCOCIN) IVPB 1000 mg/200 mL premix     1,000 mg 200 mL/hr over 60 Minutes Intravenous  Once 08/18/13 2348 08/19/13 0208   08/19/13 0000  cefTRIAXone (ROCEPHIN) 1 g in dextrose 5 % 50 mL IVPB     1 g 100 mL/hr over 30 Minutes Intravenous  Once 08/18/13 2348 08/19/13 0105      Medications:  Scheduled: . allopurinol  200 mg Oral Daily  . apixaban  5 mg Oral BID  . cefTRIAXone  (ROCEPHIN)  IV  1 g Intravenous Q24H  . diltiazem  360 mg Oral Daily  . furosemide  20 mg Oral q morning - 10a  . insulin aspart  0-9 Units Subcutaneous TID WC  . insulin glargine  15 Units Subcutaneous QHS  . metoprolol tartrate  25 mg Oral BID  . mometasone-formoterol  2 puff Inhalation BID  . sodium chloride  3 mL Intravenous Q12H  . traMADol  25 mg Oral TID  . vancomycin  750 mg Intravenous Q12H    Objective: Vital signs in last 24 hours: Temp:  [97.3 F (36.3 C)-97.6 F (36.4 C)] 97.3 F (36.3 C) (09/12 1349) Pulse Rate:  [59-80] 59 (09/12 1349) Resp:  [18-20] 18 (09/12 1349) BP: (102-126)/(56-69) 102/56 mmHg (09/12 1349) SpO2:  [92 %-99 %] 99 % (09/12 1349)   General appearance: alert, cooperative and no distress Breasts: R breast erythema decreased. no masses or flucutaunce felt.   Lab Results  Recent Labs  08/22/13 0605 08/23/13 0518  WBC 6.0 5.0  HGB 12.9 12.7  HCT 40.6 39.7  NA 139 139  K 3.8 3.9  CL 98 98  CO2 30 32  BUN 14 16  CREATININE 0.67 0.76   Liver Panel No results found for this basename:  PROT, ALBUMIN, AST, ALT, ALKPHOS, BILITOT, BILIDIR, IBILI,  in the last 72 hours Sedimentation Rate No results found for this basename: ESRSEDRATE,  in the last 72 hours C-Reactive Protein No results found for this basename: CRP,  in the last 72 hours  Microbiology: Recent Results (from the past 240 hour(s))  CULTURE, BLOOD (ROUTINE X 2)     Status: None   Collection Time    08/19/13 12:00 AM      Result Value Range Status   Specimen Description BLOOD LEFT ARM   Final   Special Requests BOTTLES DRAWN AEROBIC ONLY 9CC   Final   Culture  Setup Time     Final   Value: 08/19/2013 13:17     Performed at [facility]   Culture     Final   Value:        BLOOD CULTURE RECEIVED NO GROWTH TO DATE CULTURE WILL BE HELD FOR 5 DAYS BEFORE ISSUING A FINAL NEGATIVE REPORT     Performed at [facility]   Report Status PENDING   Incomplete  CULTURE,  BLOOD (ROUTINE X 2)     Status: None   Collection Time    08/19/13 12:05 AM      Result Value Range Status   Specimen Description BLOOD LEFT HAND   Final   Special Requests BOTTLES DRAWN AEROBIC ONLY 10CC   Final   Culture  Setup Time     Final   Value: 08/19/2013 13:17     Performed at [facility]   Culture     Final   Value:        BLOOD CULTURE RECEIVED NO GROWTH TO DATE CULTURE WILL BE HELD FOR 5 DAYS BEFORE ISSUING A FINAL NEGATIVE REPORT     Performed at [facility]   Report Status PENDING   Incomplete    Studies/Results: No results found.   Assessment/Plan: Cellulitis, R breast  Hx of breast CA.  DM  Aim for her to complete 7 days of anbx Home with po anbx (amoxil or bactrim)  Total days of antibiotics: 5 vanco, ceftriaxone         Johny Sax Infectious Diseases (pager) [phone] [email or web address] 08/23/2013, 4:12 PM  LOS: 5 days

## 2013-08-23 NOTE — Progress Notes (Signed)
Occupational Therapy Treatment Patient Details Name: [name] MRN: [id number] DOB: September 25, 1941 Today's Date: 08/23/2013 Time: 1425-1440 OT Time Calculation (min): 15 min  OT Assessment / Plan / Recommendation  History of present illness Adm 08/18/13 due to weakness and inability to stand from her chair. Found to have cellulitis of Rt breast. PMHx includes bil TKR, COPD (uses 2L O2 at home), DM, sarcoid, morbid obesity, afib.   OT comments  Pt progressing toward goals but continues to demonstrate poor activity tolerance. Recommending SNF to further progress rehab before eventual return home. Pt now stating she is agreeable to SNF, prefers [facility].  Follow Up Recommendations  Supervision/Assistance - 24 hour;SNF    Barriers to Discharge       Equipment Recommendations  None recommended by OT    Recommendations for Other Services    Frequency Min 2X/week   Progress towards OT Goals Progress towards OT goals: Progressing toward goals  Plan Discharge plan needs to be updated    Precautions / Restrictions Precautions Precautions: Fall Precaution Comments: denies falls at home PTA   Pertinent Vitals/Pain See vitals    ADL  Grooming: Performed;Wash/dry hands;Min guard Where Assessed - Grooming: Unsupported standing Toilet Transfer: Performed;Min guard Statistician Method: Sit to Barista: Comfort height toilet Toileting - Clothing Manipulation and Hygiene: Min guard;Performed Where Assessed - Engineer, mining and Hygiene: Sit to stand from 3-in-1 or toilet Equipment Used: Gait belt;Rolling walker (oxygen) Transfers/Ambulation Related to ADLs: min guard  ADL Comments: 2/4 dyspnea after completeing toileting tasks.    OT Diagnosis:    OT Problem List:   OT Treatment Interventions:     OT Goals(current goals can now be found in the care plan section) Acute Rehab OT Goals Patient Stated Goal: to go home OT Goal Formulation: With  patient Time For Goal Achievement: 08/27/13 Potential to Achieve Goals: Good ADL Goals Pt Will Perform Grooming: with modified independence;standing Pt Will Perform Lower Body Bathing: with modified independence;sit to/from stand Pt Will Perform Lower Body Dressing: with modified independence;sit to/from stand Pt Will Transfer to Toilet: with modified independence;ambulating;grab bars Pt Will Perform Toileting - Clothing Manipulation and hygiene: with modified independence;sit to/from stand Pt Will Perform Tub/Shower Transfer: Tub transfer;with supervision;ambulating;tub bench;rolling walker  Visit Information  Last OT Received On: 08/23/13 Assistance Needed: +1 History of Present Illness: Adm 08/18/13 due to weakness and inability to stand from her chair. Found to have cellulitis of Rt breast. PMHx includes bil TKR, COPD (uses 2L O2 at home), DM, sarcoid, morbid obesity, afib.    Subjective Data      Prior Functioning       Cognition  Cognition Arousal/Alertness: Awake/alert Behavior During Therapy: WFL for tasks assessed/performed Overall Cognitive Status: Within Functional Limits for tasks assessed    Mobility  Transfers Transfers: Sit to Stand;Stand to Sit Sit to Stand: 4: Min guard;From chair/3-in-1;From toilet Stand to Sit: 4: Min guard;To chair/3-in-1;To toilet    Exercises      Balance     End of Session OT - End of Session Equipment Utilized During Treatment: Gait belt;Rolling walker;Oxygen Activity Tolerance: Patient tolerated treatment well Patient left: in chair;with call bell/phone within reach  GO    08/23/2013 Cipriano Mile OTR/L Pager [phone] Office [phone]  Cipriano Mile 08/23/2013, 4:47 PM

## 2013-08-24 LAB — CBC
HCT: 40.2 % (ref 36.0–46.0)
MCH: 23.6 pg — ABNORMAL LOW (ref 26.0–34.0)
MCV: 77 fL — ABNORMAL LOW (ref 78.0–100.0)
Platelets: 165 10*3/uL (ref 150–400)
RBC: 5.22 MIL/uL — ABNORMAL HIGH (ref 3.87–5.11)
WBC: 5.7 10*3/uL (ref 4.0–10.5)

## 2013-08-24 LAB — BASIC METABOLIC PANEL
BUN: 19 mg/dL (ref 6–23)
CO2: 30 mEq/L (ref 19–32)
Calcium: 10.3 mg/dL (ref 8.4–10.5)
Chloride: 95 mEq/L — ABNORMAL LOW (ref 96–112)
Creatinine, Ser: 0.64 mg/dL (ref 0.50–1.10)

## 2013-08-24 LAB — GLUCOSE, CAPILLARY
Glucose-Capillary: 141 mg/dL — ABNORMAL HIGH (ref 70–99)
Glucose-Capillary: 179 mg/dL — ABNORMAL HIGH (ref 70–99)

## 2013-08-24 NOTE — Progress Notes (Signed)
Resp Care Note;Pt not wanting to go on CPAP at this time.

## 2013-08-24 NOTE — Progress Notes (Signed)
[facility] PROGRESS NOTE  [name] ZOX:[id number] DOB: [birth year] DOA: 08/18/2013 PCP: [name], MD  Assessment/Plan: Principal Problem:   Cellulitis of breast right Active Problems:   Atrial fibrillation   Breast cancer, stage 1 s/p rt lumpectomy and radiation 2012   COPD (chronic obstructive pulmonary disease)   Chronic respiratory failure with hypoxia   Weakness generalized    1. Cellulitis of breast right: Patient presented with generalized weakness, progressive redness, pain and swelling of right breast. Wcc was elevated at 16.9, but patient was apyrexial. CXR was devoid of acute finding, and U/S of affected breast showed diffuse breast parenchymal edema and skin thickening, worse in the lower outer quadrant. Managed with iv Vancomycin initially, Vancomycin was added on 08/21/13. Blood cultures are negative so far, and wcc has normalized. Dr Johny Sax provided ID, consultation, and has recommended switching to Vancomycin/Rocephin (Day #7 Abx). This was implemented on 08/22/13, and clinically, improvement has been gradual, but visible. Improvement continues.  2. Breast cancer: Patient has known history of stage 1 right breast cancer, s/p right lumpectomy and radiation 2012. Patient will need bilateral diagnostic mammogram and right breast ultrasound, following a course of antibiotics.  Dr Milta Deiters, service, notified of hospitalization. 3. Atrial fibrillation: Continued on Metoprolol and Eliquis. Rate-controlled. Cardizem was placed temporarily on hold, due to borderline SBP, but as BP had normalized on 08/22/13, Cardizem was reinsated on that date, without deleterious effect.   4. COPD (chronic obstructive pulmonary disease): Appears clinically stable. No evidence of exacerbation. Continue with albuterol PRN and Advair.  5. Diabetes: Managing with diet/SSI and Lantus, adjusted as indicated, with reasonable control.  6. Chronic respiratory failure with hypoxia: Stable.   7. Weakness generalized: Secondary probably to acute illness. PT/OT has evaluated, and recommended SNF vs HHPT/OT with 24 hour supervision.   8. Thrombocytopenia: Mild/Stable. Improved gradually, normalized at 165 K on 08/24/13. 9. Hypokalemia: Repleted as indicated/Resolved.  10: Leg pains: patient complained of bilateral LE pains on 08/23/13. Physical examination, was unrevealing, apart from extensive varicosities, and reassuringly, she is on Eliquis. As  Pains have continued despite analgesics, will check LE venous doppler today.    Code Status: Full Code.  Family Communication:  Disposition Plan: To be determined. Possible discharge on 08/25/13.    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 Had last routine mammography last month, per patient's reports, normal. Comes in with generalized weakness today and rash and pain of right breast for the last 2 days. No fevers. No abd pain. No chest pain. No drainage from breast. No recent injury to breast. No cough. Chronically on oxygen at home for COPD at 2L . No n/v/d. No chills. Admitted for further managment   Consultants:  N/A.   Procedures:  CXR  U/S Right Breast.   Antibiotics:  Vancomycin 08/18/13>>>  Zosyn 08/21/13>>>  HPI/Subjective: Still having Bilateral leg pains.   Objective: Vital signs in last 24 hours: Temp:  [97.3 F (36.3 C)-97.5 F (36.4 C)] 97.3 F (36.3 C) (09/13 0545) Pulse Rate:  [59-75] 65 (09/13 0545) Resp:  [18] 18 (09/13 0545) BP: (102-132)/(42-62) 117/62 mmHg (09/13 0545) SpO2:  [93 %-99 %] 93 % (09/13 0545) Weight change:  Last BM Date: 08/24/13  Intake/Output from previous day: 09/12 0701 - 09/13 0700 In: 483 [P.O.:480; I.V.:3] Out: -  Total I/O In: 240  [P.O.:240] Out: -    Physical Exam: General: Comfortable, alert, communicative, fully oriented, not short of breath at rest.  HEENT:  No clinical pallor, no jaundice, no conjunctival injection or discharge. Hydration is fair.  NECK:  Supple, JVP not seen, no carotid bruits, no palpable lymphadenopathy, no palpable goiter. CHEST:  Clinically clear to auscultation, no wheezes, no crackles. Left breast is unremarkable. Right breast shows significant improvement of local inflammatory phenomena, particularly in the periphery, with only a small area or residual induration in right, lower outer quadrant. induration and redness, centrally.  HEART:  Sounds 1 and 2 heard, normal, regular, no murmurs. ABDOMEN:  Obese, soft, non-tender, no palpable organomegaly, no palpable masses, normal bowel sounds. GENITALIA:  Not examined. LOWER EXTREMITIES:  No pitting edema, palpable peripheral pulses. Has extensive varicosities.  MUSCULOSKELETAL SYSTEM:  Generalized osteoarthritic changes, otherwise, normal. CENTRAL NERVOUS SYSTEM:  No focal neurologic deficit on gross examination.  Lab Results:  Recent Labs  08/23/13 0518 08/24/13 0400  WBC 5.0 5.7  HGB 12.7 12.3  HCT 39.7 40.2  PLT 142* 165    Recent Labs  08/23/13 0518 08/24/13 0400  NA 139 135  K 3.9 3.8  CL 98 95*  CO2 32 30  GLUCOSE 127* 170*  BUN 16 19  CREATININE 0.76 0.64  CALCIUM 10.4 10.3   Recent Results (from the past 240 hour(s))  CULTURE, BLOOD (ROUTINE X 2)     Status: None   Collection Time    08/19/13 12:00 AM      Result Value Range Status   Specimen Description BLOOD LEFT ARM   Final   Special Requests BOTTLES DRAWN AEROBIC ONLY 9CC   Final   Culture  Setup Time     Final   Value: 08/19/2013 13:17     Performed at [facility]   Culture     Final   Value:        BLOOD CULTURE RECEIVED NO GROWTH TO DATE CULTURE WILL BE HELD FOR 5 DAYS BEFORE ISSUING A FINAL NEGATIVE REPORT     Performed at [facility]   Report Status PENDING   Incomplete  CULTURE, BLOOD (ROUTINE X 2)     Status: None   Collection Time    08/19/13 12:05 AM      Result Value Range Status   Specimen Description BLOOD LEFT HAND   Final   Special Requests BOTTLES DRAWN AEROBIC ONLY 10CC   Final   Culture  Setup Time     Final   Value: 08/19/2013 13:17     Performed at [facility]   Culture     Final   Value: GRAM POSITIVE COCCI IN CLUSTERS     Note: Gram Stain Report Called to,Read Back By and Verified With: KOKETHIA OGUN ON 08/23/2013 AT 10:47P BY WILEJ     Performed at [facility]   Report Status PENDING   Incomplete     Studies/Results: No results found.  Medications: Scheduled Meds: . allopurinol  200 mg Oral Daily  . apixaban  5 mg Oral BID  . cefTRIAXone (ROCEPHIN)  IV  1 g Intravenous Q24H  . diltiazem  360 mg Oral Daily  . furosemide  20 mg Oral q morning - 10a  . insulin aspart  0-9 Units Subcutaneous TID WC  . insulin glargine  15 Units Subcutaneous QHS  . metoprolol tartrate  25 mg Oral BID  . mometasone-formoterol  2 puff  Inhalation BID  . sodium chloride  3 mL Intravenous Q12H  . traMADol  25 mg Oral TID  . vancomycin  750 mg Intravenous Q12H   Continuous Infusions:  PRN Meds:.sodium chloride, acetaminophen, acetaminophen, albuterol, LORazepam, ondansetron (ZOFRAN) IV, ondansetron, sodium chloride    LOS: 6 days   Glennie Bose,CHRISTOPHER  [facility] Pager [phone]. If 8PM-8AM, please contact night-coverage at [email or web address], password [facility] 08/24/2013, 12:25 PM  LOS: 6 days

## 2013-08-24 NOTE — Progress Notes (Signed)
Pt complained of bilateral leg pain. Upon assessment left lower extremity was warm with mild swelling and generalized redness noted and swelling to ankle. Pt stated that she has had pain since beginning of hospitalization.

## 2013-08-25 DIAGNOSIS — M79609 Pain in unspecified limb: Secondary | ICD-10-CM

## 2013-08-25 LAB — CBC
HCT: 39.9 % (ref 36.0–46.0)
MCH: 23.7 pg — ABNORMAL LOW (ref 26.0–34.0)
MCV: 77 fL — ABNORMAL LOW (ref 78.0–100.0)
RBC: 5.18 MIL/uL — ABNORMAL HIGH (ref 3.87–5.11)
RDW: 17.9 % — ABNORMAL HIGH (ref 11.5–15.5)
WBC: 5.7 10*3/uL (ref 4.0–10.5)

## 2013-08-25 LAB — CULTURE, BLOOD (ROUTINE X 2): Culture: NO GROWTH

## 2013-08-25 LAB — GLUCOSE, CAPILLARY: Glucose-Capillary: 234 mg/dL — ABNORMAL HIGH (ref 70–99)

## 2013-08-25 LAB — BASIC METABOLIC PANEL
BUN: 22 mg/dL (ref 6–23)
CO2: 29 mEq/L (ref 19–32)
Calcium: 10.3 mg/dL (ref 8.4–10.5)
Chloride: 97 mEq/L (ref 96–112)
Creatinine, Ser: 0.63 mg/dL (ref 0.50–1.10)

## 2013-08-25 NOTE — Progress Notes (Signed)
VASCULAR LAB PRELIMINARY  PRELIMINARY  PRELIMINARY  PRELIMINARY  Bilateral lower extremity venous duplex  completed.    Preliminary report:  Bilateral:  No evidence of DVT, superficial thrombosis, or Baker's Cyst.    Zanyiah Posten, RVT 08/25/2013, 9:37 AM

## 2013-08-25 NOTE — Progress Notes (Signed)
Clinical Child psychotherapist (CSW) met with patient and gave list of bed offers. Patient chose [facility].   Jetta Lout, LCSWA Weekend CSW [phone]

## 2013-08-25 NOTE — Progress Notes (Signed)
[facility] PROGRESS NOTE  [name] ZOX:[id number] DOB: 03-03-1941 DOA: 08/18/2013 PCP: [name], MD  Assessment/Plan: Principal Problem:   Cellulitis of breast right Active Problems:   Atrial fibrillation   Breast cancer, stage 1 s/p rt lumpectomy and radiation 2012   COPD (chronic obstructive pulmonary disease)   Chronic respiratory failure with hypoxia   Weakness generalized    1. Cellulitis of breast right: Patient presented with generalized weakness, progressive redness, pain and swelling of right breast. Wcc was elevated at 16.9, but patient was apyrexial. CXR was devoid of acute findings, and U/S of affected breast showed diffuse breast parenchymal edema and skin thickening, worse in the lower outer quadrant. Managed with iv Vancomycin initially, Zosyn was added on 08/21/13. Blood cultures remained nergative, and wcc has normalized. Dr Johny Sax provided ID, consultation, and recommended switching to Vancomycin/Rocephin (Day #8 Abx). This was implemented on 08/22/13, and clinically, improvement was gradual, but steady. Clinically, only very minimal resolving induration remains in lower outer quadrant. Antibiotics have been discontinued today.  2. Breast cancer: Patient has known history of stage 1 right breast cancer, s/p right lumpectomy and radiation 2012. Patient will need bilateral diagnostic mammogram and right breast ultrasound, following her course of antibiotics. Dr Milta Deiters, service, notified of hospitalization. Will defer to primary oncologist, to arrange.  3. Atrial fibrillation: Continued on Metoprolol and Eliquis. Rate-controlled. Cardizem was placed temporarily on hold, due to borderline SBP, but as BP had normalized on 08/22/13, Cardizem was reinsated on that date, without deleterious effect.   4. COPD (chronic obstructive pulmonary disease): Appears clinically stable. No evidence of exacerbation. Continue with albuterol PRN and Advair.  5. Diabetes:  Managing with diet/SSI and Lantus, adjusted as indicated, with reasonable control.  6. Chronic respiratory failure with hypoxia: Stable.  7. Weakness generalized: Secondary probably to acute illness. PT/OT has evaluated, and recommended SNF vs HHPT/OT with 24 hour supervision.   8. Thrombocytopenia: Mild/Stable. Improved gradually, normalized at 165 K on 08/24/13. 9. Hypokalemia: Repleted as indicated/Resolved.  10: Leg pains: patient complained of bilateral LE pains on 08/23/13. Physical examination, was unrevealing, apart from extensive varicosities, and reassuringly, she is on Eliquis. As pains persisted despite analgesics, LE venous doppler was done on 08/24/13, and was negative for DVT, superficial thrombosis, or Baker's Cyst. Patient has been reassured accordingly.      Code Status: Full Code.  Family Communication:  Disposition Plan: To be determined. Possible discharge on 08/26/13.    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 Had last routine mammography last month, per patient's reports, normal. Comes in with generalized weakness today and rash and pain of right breast for the last 2 days. No fevers. No abd pain. No chest pain. No drainage from breast. No recent injury to breast. No cough. Chronically on oxygen at home for COPD at 2L [place]. No n/v/d. No chills. Admitted for further managment   Consultants:  N/A.   Procedures:  CXR  U/S Right Breast.   Antibiotics:  Vancomycin 08/18/13-08/25/13.  Zosyn 08/21/13-08/25/13.  HPI/Subjective: No new issues.   Objective: Vital signs in last 24 hours: Temp:  [97.8 F (36.6 C)] 97.8 F (36.6 C) (09/14 0600) Pulse Rate:  [63-78] 78 (09/14 0956) Resp:  [18] 18 (09/14 0600) BP: (120-142)/(50-67) 142/53  mmHg (09/14 0956) SpO2:  [93 %-  99 %] 99 % (09/14 0956) FiO2 (%):  [28 %] 28 % (09/14 0929) Weight change:  Last BM Date: 08/24/13  Intake/Output from previous day: 09/13 0701 - 09/14 0700 In: 240 [P.O.:240] Out: -  Total I/O In: 150 [IV Piggyback:150] Out: -    Physical Exam: General: Comfortable, alert, communicative, fully oriented, not short of breath at rest.  HEENT:  No clinical pallor, no jaundice, no conjunctival injection or discharge. Hydration is fair.  NECK:  Supple, JVP not seen, no carotid bruits, no palpable lymphadenopathy, no palpable goiter. CHEST:  Clinically clear to auscultation, no wheezes, no crackles. Left breast is unremarkable. Right breast local inflammatory phenomena, have practically resolved.  HEART:  Sounds 1 and 2 heard, normal, regular, no murmurs. ABDOMEN:  Obese, soft, non-tender, no palpable organomegaly, no palpable masses, normal bowel sounds. GENITALIA:  Not examined. LOWER EXTREMITIES:  No pitting edema, palpable peripheral pulses. Has extensive varicosities.  MUSCULOSKELETAL SYSTEM:  Generalized osteoarthritic changes, otherwise, normal. CENTRAL NERVOUS SYSTEM:  No focal neurologic deficit on gross examination.  Lab Results:  Recent Labs  08/24/13 0400 08/25/13 0545  WBC 5.7 5.7  HGB 12.3 12.3  HCT 40.2 39.9  PLT 165 177    Recent Labs  08/24/13 0400 08/25/13 0545  NA 135 136  K 3.8 4.1  CL 95* 97  CO2 30 29  GLUCOSE 170* 140*  BUN 19 22  CREATININE 0.64 0.63  CALCIUM 10.3 10.3   Recent Results (from the past 240 hour(s))  CULTURE, BLOOD (ROUTINE X 2)     Status: None   Collection Time    08/19/13 12:00 AM      Result Value Range Status   Specimen Description BLOOD LEFT ARM   Final   Special Requests BOTTLES DRAWN AEROBIC ONLY 9CC   Final   Culture  Setup Time     Final   Value: 08/19/2013 13:17     Performed at [facility]   Culture     Final   Value: NO GROWTH 5 DAYS     Performed at [facility]   Report Status 08/25/2013 FINAL   Final  CULTURE, BLOOD (ROUTINE X 2)     Status: None   Collection Time    08/19/13 12:05 AM      Result Value Range Status   Specimen Description BLOOD LEFT HAND   Final   Special Requests BOTTLES DRAWN AEROBIC ONLY 10CC   Final   Culture  Setup Time     Final   Value: 08/19/2013 13:17     Performed at [facility]   Culture     Final   Value: MICROCOCCUS SPECIES     Note: Standardized susceptibility testing for this organism is not available.     Note: Gram Stain Report Called to,Read Back By and Verified With: KOKETHIA OGUN ON 08/23/2013 AT 10:47P BY Serafina Mitchell     Performed at [facility]   Report Status 08/25/2013 FINAL   Final     Studies/Results: No results found.  Medications: Scheduled Meds: . allopurinol  200 mg Oral Daily  . apixaban  5 mg Oral BID  . cefTRIAXone (ROCEPHIN)  IV  1 g Intravenous Q24H  . diltiazem  360 mg Oral Daily  . furosemide  20 mg Oral q morning - 10a  . insulin aspart  0-9 Units Subcutaneous TID WC  . insulin glargine  15 Units Subcutaneous QHS  . metoprolol tartrate  25 mg Oral BID  .  mometasone-formoterol  2 puff Inhalation BID  . sodium chloride  3 mL Intravenous Q12H  . traMADol  25 mg Oral TID  . vancomycin  750 mg Intravenous Q12H   Continuous Infusions:  PRN Meds:.sodium chloride, acetaminophen, acetaminophen, albuterol, LORazepam, ondansetron (ZOFRAN) IV, ondansetron, sodium chloride    LOS: 7 days   Trevino Wyatt,CHRISTOPHER  [facility] Pager [phone]. If 8PM-8AM, please contact night-coverage at [email or web address], password [facility] 08/25/2013, 4:11 PM  LOS: 7 days

## 2013-08-25 NOTE — Progress Notes (Signed)
ANTIBIOTIC CONSULT NOTE - FOLLOW UP  Pharmacy Consult for vancomycin Indication: cellulitis  Labs:  Recent Labs  08/23/13 0518 08/24/13 0400 08/25/13 0545  WBC 5.0 5.7 5.7  HGB 12.7 12.3 12.3  PLT 142* 165 177  CREATININE 0.76 0.64 0.63   Estimated Creatinine Clearance: 69.1 ml/min (by C-G formula based on Cr of 0.63).  Recent Labs  08/23/13 0518  VANCOTROUGH 18.5       Assessment: 71yo female receiving vancomycin and ceftriaxone for cellulitis of right breast.  She is afebrile.  One blood shows a micrococcus which is probably a contaminant.  ID has see her and recommends 7 days of IV abx, then change to oral.  Today is day #7 of vancomycin and #4 of ceftriaxone.  Here renal function remains stable.  Goal of Therapy:  Vancomycin trough level 10-15 mcg/ml  Plan:  Will continue vancomycin  750mg  IV Q12H and continue to monitor.  Celedonio Miyamoto, PharmD, BCPS Clinical Pharmacist Pager [phone]  08/25/2013,11:26 AM

## 2013-08-26 ENCOUNTER — Other Ambulatory Visit: Payer: Medicare Other

## 2013-08-26 LAB — GLUCOSE, CAPILLARY
Glucose-Capillary: 145 mg/dL — ABNORMAL HIGH (ref 70–99)
Glucose-Capillary: 237 mg/dL — ABNORMAL HIGH (ref 70–99)
Glucose-Capillary: 162 mg/dL — ABNORMAL HIGH (ref 70–99)
Glucose-Capillary: 127 mg/dL — ABNORMAL HIGH (ref 70–99)

## 2013-08-26 MED ORDER — HYDROCOD POLST-CHLORPHEN POLST 10-8 MG/5ML PO LQCR: 5.0000 mL | Freq: Two times a day (BID) | ORAL | Status: DC | PRN

## 2013-08-26 MED ORDER — LORAZEPAM 0.5 MG PO TABS: 0.5000 mg | ORAL_TABLET | Freq: Every evening | ORAL | Status: DC | PRN

## 2013-08-26 MED ORDER — HYDROCODONE-ACETAMINOPHEN 5-325 MG PO TABS: 1.0000 | ORAL_TABLET | Freq: Four times a day (QID) | ORAL | Status: DC | PRN

## 2013-08-26 MED ORDER — POLYETHYLENE GLYCOL 3350 17 G PO PACK
17.0000 g | PACK | Freq: Every day | ORAL | Status: DC
  Administered 2013-08-26: 17 g via ORAL
  Filled 2013-08-26: qty 1

## 2013-08-26 MED ORDER — BISACODYL 10 MG RE SUPP
10.0000 mg | Freq: Once | RECTAL | Status: AC
  Administered 2013-08-26: 10 mg via RECTAL
  Filled 2013-08-26: qty 1

## 2013-08-26 MED ORDER — POLYETHYLENE GLYCOL 3350 17 G PO PACK: 17.0000 g | PACK | Freq: Every day | ORAL | Status: DC

## 2013-08-26 NOTE — Clinical Social Work Note (Signed)
Clinical Social Worker facilitated patient discharge including contacting patient family and facility to confirm patient discharge plans.  Clinical information faxed to facility and family agreeable with plan.  CSW arranged ambulance transport via [facility] to [facility].  RN to call report prior to discharge.  Clinical Social Worker will sign off for now as social work intervention is no longer needed. Please consult us again if new need arises.  Jesse Ishmel Acevedo, LCSW [phone] 

## 2013-08-26 NOTE — Progress Notes (Signed)
Patient discharged to [facility] via [facility], report given to nurse Jacklynn Bue.

## 2013-08-26 NOTE — Progress Notes (Signed)
Patient's IV site leaking when flushed.  IV DC'd and gauze dressing applied.  Patient refuses new IV insertion, stating that she is not receiving anything and will be discharged in the morning.

## 2013-08-26 NOTE — Discharge Summary (Signed)
Physician Discharge Summary  [name] YQI:[id number] DOB: 01-21-1941 DOA: 08/18/2013  PCP: [name], MD  Admit date: 08/18/2013 Discharge date: 08/26/2013  Time spent: 40 minutes  Recommendations for Outpatient Follow-up:  1. Follow up with Primary MD.  2. Follow up with primary oncologist, Dr Drue Second. Has appointment schedule for end of September.  3. Primary oncologist to arrange bilateral diagnostic mammogram and right breast ultrasound, at her own discretion.   Discharge Diagnoses:  Principal Problem:   Cellulitis of breast right Active Problems:   Atrial fibrillation   Breast cancer, stage 1 s/p rt lumpectomy and radiation 2012   COPD (chronic obstructive pulmonary disease)   Chronic respiratory failure with hypoxia   Weakness generalized   Discharge Condition: Satisfactory.  Diet recommendation: Heart-Healthy.   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 Had last routine mammography last month, per patient's reports, normal. Comes in with generalized weakness today and rash and pain of right breast for the last 2 days. No fevers. No abd pain. No chest pain. No drainage from breast. No recent injury to breast. No cough. Chronically on oxygen at home for COPD at 2L . No n/v/d. No chills. Admitted for further management.    Hospital Course:  1. Cellulitis of breast right: Patient presented with generalized weakness, progressive redness, pain and swelling of right breast. Wcc was elevated at 16.9, but patient was apyrexial. CXR was devoid of acute findings, and U/S of affected breast showed diffuse breast parenchymal edema and skin thickening, worse  in the lower outer quadrant. Managed with iv Vancomycin initially, Zosyn was added on 08/21/13. Blood cultures revealed micrococcus spp in 1:2 bottles, and wcc has normalized. Dr Johny Sax provided ID, consultation, and recommended switching to Vancomycin/Rocephin. This was implemented on 08/22/13, and clinically, improvement was gradual, but steady. Clinically, only very minimal resolving induration remains in lower outer quadrant. Antibiotics were discontinued on 08/25/13.  2. Breast cancer: Patient has known history of stage 1 right breast cancer, s/p right lumpectomy and radiation 2012. Patient will need bilateral diagnostic mammogram and right breast ultrasound, following her course of antibiotics. Dr Milta Deiters, service, notified of hospitalization. Will defer to primary oncologist, to arrange.  3. Atrial fibrillation: Continued on Metoprolol and Eliquis. Rate-controlled. Cardizem was placed temporarily on hold, due to borderline SBP, but as BP had normalized on 08/22/13, Cardizem was reinsated on that date, without deleterious effect.  4. COPD (chronic obstructive pulmonary disease): Appears clinically stable. No evidence of exacerbation. Managed with albuterol PRN and Advair.  5. Diabetes: Managing with diet/SSI and Lantus, adjusted as indicated, with reasonable control.  6. Chronic respiratory failure with hypoxia: Stable.  7. Weakness generalized: Secondary probably to acute illness. PT/OT has evaluated, and recommended SNF vs HHPT/OT with 24 hour supervision.  8. Thrombocytopenia: Mild/Stable. Improved gradually, normalized at 165 K on 08/24/13.  9. Hypokalemia: Repleted as indicated/Resolved.  10: Leg pains: Patient complained of bilateral LE pains on 08/23/13. Physical examination, was unrevealing, apart from extensive varicosities, and reassuringly, she is on Eliquis. As pains persisted despite analgesics, LE venous doppler was done on 08/24/13, and was negative for DVT, superficial thrombosis, or  Baker's Cyst. Patient has been reassured accordingly.  Procedures:  See Below.  Bilateral Le venous Dopplers.   Consultations:  N/A.   Discharge Exam: Filed Vitals:   08/26/13 0634  BP: 120/59  Pulse: 59  Temp: 97.5 F (36.4 C)  Resp: 17    General: Comfortable, alert, communicative, fully oriented, not short of breath at rest.  HEENT: No clinical pallor, no jaundice, no conjunctival injection or discharge. Hydration is fair.  NECK: Supple, JVP not seen, no carotid bruits, no palpable lymphadenopathy, no palpable goiter.  CHEST: Clinically clear to auscultation, no wheezes, no crackles. Left breast is unremarkable. Right breast local inflammatory phenomena, have practically resolved.  HEART: Sounds 1 and 2 heard, normal, regular, no murmurs.  ABDOMEN: Obese, soft, non-tender, no palpable organomegaly, no palpable masses, normal bowel sounds.  GENITALIA: Not examined.  LOWER EXTREMITIES: No pitting edema, palpable peripheral pulses. Has extensive varicosities.  MUSCULOSKELETAL SYSTEM: Generalized osteoarthritic changes, otherwise, normal.  CENTRAL NERVOUS SYSTEM: No focal neurologic deficit on gross examination.  Discharge Instructions      Discharge Orders   Future Appointments Provider Department Dept Phone   08/28/2013 1:00 PM [facility] [address] [phone]   Patient to arrive 15 minutes prior to appointment time.   09/09/2013 10:30 AM Krista Blue [facility] MEDICAL ONCOLOGY [phone]   09/09/2013 11:00 AM Victorino December, MD [facility] MEDICAL ONCOLOGY [phone]   11/04/2013 11:00 AM Waymon Budge, MD [facility] [phone]   Future Orders Complete By Expires   Diet - low sodium heart healthy  As directed    Diet Carb Modified  As directed    Increase activity slowly  As directed        Medication List         acetaminophen 500 MG tablet  Commonly known as:  TYLENOL   Take 1,000 mg by mouth every 6 (six) hours as needed. pain     allopurinol 100 MG tablet  Commonly known as:  ZYLOPRIM  Take 200 mg by mouth daily.     apixaban 5 MG Tabs tablet  Commonly known as:  ELIQUIS  Take 1 tablet (5 mg total) by mouth 2 (two) times daily.     chlorpheniramine-HYDROcodone 10-8 MG/5ML Lqcr  Commonly known as:  TUSSIONEX  Take 5 mLs by mouth every 12 (twelve) hours as needed.     colchicine 0.6 MG tablet  Take 1 tablet (0.6 mg total) by mouth 2 (two) times daily.     diltiazem 360 MG 24 hr capsule  Commonly known as:  TIAZAC  Take 360 mg by mouth daily.     exemestane 25 MG tablet  Commonly known as:  AROMASIN  Take 1 tablet (25 mg total) by mouth daily after breakfast.     ferrous fumarate 325 (106 FE) MG Tabs tablet  Commonly known as:  HEMOCYTE - 106 mg FE  Take 1 tablet by mouth 3 (three) times daily.     fexofenadine 180 MG tablet  Commonly known as:  ALLEGRA  Take 180 mg by mouth daily.     Fluticasone-Salmeterol 250-50 MCG/DOSE Aepb  Commonly known as:  ADVAIR  Inhale 1 puff into the lungs every 12 (twelve) hours.     furosemide 20 MG tablet  Commonly known as:  LASIX  Take 1 tablet by mouth every morning.     guaiFENesin 600 MG 12 hr tablet  Commonly known as:  MUCINEX  Take 1,200 mg by mouth 2 (two) times  daily.     HUMULIN N 100 UNIT/ML injection  Generic drug:  insulin NPH  Inject 5 Units into the skin as needed (5 units if blood sugar is greater than 150).     HYDROcodone-acetaminophen 5-325 MG per tablet  Commonly known as:  NORCO/VICODIN  Take 1 tablet by mouth every 6 (six) hours as needed. For pain.     insulin glargine 100 UNIT/ML injection  Commonly known as:  LANTUS  Inject 0.28 mLs (28 Units total) into the skin at bedtime.     ipratropium 0.02 % nebulizer solution  Commonly known as:  ATROVENT  Take 2.5 mLs (0.5 mg total) by nebulization every 6 (six) hours.     levalbuterol 0.63 MG/3ML nebulizer solution   Commonly known as:  XOPENEX  Take 3 mLs (0.63 mg total) by nebulization every 6 (six) hours.     LORazepam 0.5 MG tablet  Commonly known as:  ATIVAN  Take 1 tablet (0.5 mg total) by mouth at bedtime as needed. For sleep.     meclizine 25 MG tablet  Commonly known as:  ANTIVERT  Take 6.25-12.5 mg by mouth 3 (three) times daily as needed. For dizziness.     metFORMIN 500 MG tablet  Commonly known as:  GLUCOPHAGE  Take 500 mg by mouth 2 (two) times daily with a meal. If cbg is greater than 120 pt takes medication     metoprolol tartrate 25 MG tablet  Commonly known as:  LOPRESSOR  Take 25 mg by mouth 2 (two) times daily.     multivitamin with minerals Tabs tablet  Take 1 tablet by mouth daily.     ondansetron 4 MG tablet  Commonly known as:  ZOFRAN  Take 4 mg by mouth every 8 (eight) hours as needed for nausea.     polyethylene glycol packet  Commonly known as:  MIRALAX / GLYCOLAX  Take 17 g by mouth daily.     PROVENTIL HFA 108 (90 BASE) MCG/ACT inhaler  Generic drug:  albuterol  Inhale 2 puffs into the lungs every 6 (six) hours as needed for wheezing.     simvastatin 40 MG tablet  Commonly known as:  ZOCOR  Take 40 mg by mouth at bedtime.     vitamin C 1000 MG tablet  Take 1,000 mg by mouth daily.       Allergies  Allergen Reactions  . Aspirin     Avoids due to being on blood thinners  . Contrast Media [Iodinated Diagnostic Agents] Nausea And Vomiting  . Ibuprofen     Avoids due to being on blood thinners  . Pravachol Other (See Comments)    Muscle pain  . Pravastatin Sodium    Follow-up Information   Follow up with [name], MD.   Specialty:  Family Medicine   Contact information:   [facility] [address] [place] [address] [phone]       Follow up with Drue Second, MD. (Keep prior appointment. )    Specialty:  Oncology   Contact information:   [address] [place]  [address] [phone]        The results of significant diagnostics from this hospitalization (including imaging, microbiology, ancillary and laboratory) are listed below for reference.    Significant Diagnostic Studies: US Breast Right  08/19/2013   *RADIOLOGY REPORT*  Clinical Data: History of right breast cancer.  Status post lumpectomy.  The patient has diffuse redness of the right  breast.  ULTRASOUND RIGHT BREAST  Comparison: None available  Findings: Ultrasound performed of the right breast, assessing each quadrant.  There is diffuse parenchymal edema throughout the breast, worse within the lower outer quadrant.  Within this quadrant, there is marked skin thickening, measuring up to 9 mm. No discrete abscess identified.  IMPRESSION:  1.  Diffuse breast parenchymal edema and skin thickening, worse in the lower outer quadrant. 2.  Differential diagnosis includes mastitis or locally advanced vs inflammatory breast cancer. If there is lack of clinical improvement, tissue diagnosis would be recommended with skin biopsy.  This study was performed while the patient was an inpatient.  Follow-up bilateral diagnostic mammogram and right breast ultrasound are suggested following a course of antibiotics. 3.  No evidence for abscess.  The findings were discussed with Dr. Dianah Field on 08/19/2013 at 3:15 p.m.   Original Report Authenticated By: Norva Pavlov, M.D.   Dg Chest Portable 1 View  08/18/2013   *RADIOLOGY REPORT*  Clinical Data: TIA  PORTABLE CHEST - 1 VIEW  Comparison: Chest radiograph 06/06/2013  Findings: Stable mildly enlarged heart silhouette.  Chronic bronchitic change present.  No effusion, infiltrate, or pneumothorax.  Mild basilar atelectasis.  IMPRESSION: No acute cardiopulmonary process.   Original Report Authenticated By: Genevive Bi, M.D.    Microbiology: Recent Results (from the past 240 hour(s))  CULTURE, BLOOD (ROUTINE X 2)     Status: None   Collection Time    08/19/13 12:00 AM       Result Value Range Status   Specimen Description BLOOD LEFT ARM   Final   Special Requests BOTTLES DRAWN AEROBIC ONLY 9CC   Final   Culture  Setup Time     Final   Value: 08/19/2013 13:17     Performed at [facility]   Culture     Final   Value: NO GROWTH 5 DAYS     Performed at [facility]   Report Status 08/25/2013 FINAL   Final  CULTURE, BLOOD (ROUTINE X 2)     Status: None   Collection Time    08/19/13 12:05 AM      Result Value Range Status   Specimen Description BLOOD LEFT HAND   Final   Special Requests BOTTLES DRAWN AEROBIC ONLY 10CC   Final   Culture  Setup Time     Final   Value: 08/19/2013 13:17     Performed at [facility]   Culture     Final   Value: MICROCOCCUS SPECIES     Note: Standardized susceptibility testing for this organism is not available.     Note: Gram Stain Report Called to,Read Back By and Verified With: KOKETHIA OGUN ON 08/23/2013 AT 10:47P BY WILEJ     Performed at [facility]   Report Status 08/25/2013 FINAL   Final     Labs: Basic Metabolic Panel:  Recent Labs Lab 08/21/13 0550 08/22/13 0605 08/23/13 0518 08/24/13 0400 08/25/13 0545  NA 141 139 139 135 136  K 4.0 3.8 3.9 3.8 4.1  CL 103 98 98 95* 97  CO2 31 30 32 30 29  GLUCOSE 127* 119* 127* 170* 140*  BUN 11 14 16 19 22   CREATININE 0.67 0.67 0.76 0.64 0.63  CALCIUM 10.0 10.3 10.4 10.3 10.3   Liver Function Tests: No results found for this basename: AST, ALT, ALKPHOS, BILITOT, PROT, ALBUMIN,  in the last 168 hours No results found for this basename: LIPASE, AMYLASE,  in the  last 168 hours No results found for this basename: AMMONIA,  in the last 168 hours CBC:  Recent Labs Lab 08/21/13 0550 08/22/13 0605 08/23/13 0518 08/24/13 0400 08/25/13 0545  WBC 6.8 6.0 5.0 5.7 5.7  HGB 12.4 12.9 12.7 12.3 12.3  HCT 40.0 40.6 39.7 40.2 39.9  MCV 77.7* 76.6* 76.1* 77.0* 77.0*  PLT 128* 138* 142* 165 177   Cardiac Enzymes: No results found  for this basename: CKTOTAL, CKMB, CKMBINDEX, TROPONINI,  in the last 168 hours BNP: BNP (last 3 results)  Recent Labs  05/05/13 0519 05/06/13 0515 05/11/13 0450  PROBNP 3589.0* 3322.0* 1374.0*   CBG:  Recent Labs Lab 08/25/13 0820 08/25/13 1154 08/25/13 1714 08/25/13 2158 08/26/13 0750  GLUCAP 126* 203* 162* 234* 127*       Signed:  Miquel Lamson,CHRISTOPHER  [facility] 08/26/2013, 11:37 AM

## 2013-08-26 NOTE — Progress Notes (Signed)
Physical Therapy Treatment Patient Details Name: [name] MRN: [id number] DOB: [birth year] Today's Date: 08/26/2013 Time: 1102-1129 PT Time Calculation (min): 27 min  PT Assessment / Plan / Recommendation  History of Present Illness Adm 08/18/13 due to weakness and inability to stand from her chair. Found to have cellulitis of Rt breast. PMHx includes bil TKR, COPD (uses 2L O2 at home), DM, sarcoid, morbid obesity, afib.   PT Comments   Therapy session attempted x3 before pt felt she could participate due to LE pain. Pt was very agreeable and appeared to push herself during gait training for increased distance. She demonstrated safety awareness with the walker and no LOB when negotiating in the restroom and washing her hands at the sink. Therapist assisted with O2 tank and remained at close guard for bathroom activities. Pt reports she may d/c today for SNF.  Follow Up Recommendations  SNF     Does the patient have the potential to tolerate intense rehabilitation     Barriers to Discharge        Equipment Recommendations  None recommended by PT    Recommendations for Other Services    Frequency Min 3X/week   Progress towards PT Goals Progress towards PT goals: Progressing toward goals  Plan Current plan remains appropriate    Precautions / Restrictions Precautions Precautions: Fall Restrictions Weight Bearing Restrictions: No   Pertinent Vitals/Pain 7/10 pain decreased to 5/10 pain after pt got pain medication. Pt reports L LE hurting more than R during ambulation.    Mobility  Bed Mobility Bed Mobility: Not assessed (Pt received up in the chair) Transfers Transfers: Sit to Stand;Stand to Sit Sit to Stand: 4: Min guard;From chair/3-in-1;With armrests;From toilet Stand to Sit: 4: Min guard;To chair/3-in-1;With armrests;To toilet Details for Transfer Assistance: Pt required increased time to complete transfers but was able to do so without physical assist.Pt was cued for  safety awareness with the RW. Ambulation/Gait Ambulation/Gait Assistance: 4: Min guard Ambulation Distance (Feet): 100 Feet Assistive device: Rolling walker Ambulation/Gait Assistance Details: Pt cued for improved posture and walker position close to pt's body. As noted before, it is difficult for pt to stand with proper posture due to thoracic kyphosis Gait Pattern: Step-through pattern;Decreased stride length;Trunk flexed Gait velocity: Decreased Stairs: No    Exercises General Exercises - Lower Extremity Long Arc Quad: 15 reps;Both Hip ABduction/ADduction: 15 reps;Both;Strengthening (isometric for hip adduction squeeze with pillow) Hip Flexion/Marching: 15 reps;Both   PT Diagnosis:    PT Problem List:   PT Treatment Interventions:     PT Goals (current goals can now be found in the care plan section) Acute Rehab PT Goals Patient Stated Goal: to go home PT Goal Formulation: With patient Time For Goal Achievement: 08/27/13 Potential to Achieve Goals: Good  Visit Information  Last PT Received On: 08/26/13 Assistance Needed: +1 History of Present Illness: Adm 08/18/13 due to weakness and inability to stand from her chair. Found to have cellulitis of Rt breast. PMHx includes bil TKR, COPD (uses 2L O2 at home), DM, sarcoid, morbid obesity, afib.    Subjective Data  Subjective: "I think the Tylenol is finally kicking in." Patient Stated Goal: to go home   Cognition  Cognition Arousal/Alertness: Awake/alert Behavior During Therapy: WFL for tasks assessed/performed Overall Cognitive Status: Within Functional Limits for tasks assessed    Balance  Balance Balance Assessed: Yes Static Sitting Balance Static Sitting - Balance Support: Bilateral upper extremity supported;Feet supported Static Sitting - Level of Assistance: 6: Modified  independent (Device/Increase time) Static Sitting - Comment/# of Minutes: 5 Static Standing Balance Static Standing - Balance Support: During  functional activity;No upper extremity supported (washing hands at sink) Static Standing - Level of Assistance: 5: Stand by assistance Static Standing - Comment/# of Minutes: 1  End of Session PT - End of Session Equipment Utilized During Treatment: Gait belt;Oxygen Activity Tolerance: Patient tolerated treatment well Patient left: in chair;with call bell/phone within reach; MD present in room   GP     Ruthann Cancer 08/26/2013, 1:20 PM  Ruthann Cancer, PT Acute Rehabilitation Services

## 2013-08-26 NOTE — Progress Notes (Signed)
Nutrition Brief Note  Patient identified on the Malnutrition Screening Tool (MST) Report for unintentional weight loss and poor intake. Patient reports an unknown amount of weight loss in the past few months. 4% weight loss in 3 months is insignificant. Current PO intake is adequate.  Wt Readings from Last 15 Encounters:  08/19/13 242 lb 4.6 oz (109.9 kg)  08/08/13 252 lb (114.306 kg)  06/06/13 262 lb 3.2 oz (118.933 kg)  05/22/13 252 lb 13.9 oz (114.7 kg)  05/14/13 256 lb 13.4 oz (116.5 kg)  04/08/13 260 lb (117.935 kg)  03/08/13 259 lb 14.4 oz (117.89 kg)  01/29/13 274 lb (124.286 kg)  11/16/12 280 lb 3.3 oz (127.1 kg)  11/02/12 274 lb 3.2 oz (124.376 kg)  08/08/12 294 lb (133.358 kg)  05/04/12 293 lb (132.904 kg)  02/02/12 281 lb 6.4 oz (127.642 kg)  01/19/12 279 lb 3.2 oz (126.644 kg)  12/15/11 276 lb (125.193 kg)    Body mass index is 48.84 kg/(m^2). Patient meets criteria for class 3, extreme/morbid obesity based on current BMI.   Current diet order is CHO-modified medium, patient is consuming approximately 85-100% of meals at this time. Labs and medications reviewed.   No nutrition interventions warranted at this time. If nutrition issues arise, please consult RD.   Joaquin Courts, RD, LDN, CNSC Pager [phone] After Hours Pager [phone]

## 2013-08-28 ENCOUNTER — Encounter: Payer: Self-pay | Admitting: Internal Medicine

## 2013-08-28 ENCOUNTER — Other Ambulatory Visit: Payer: Medicare Other

## 2013-08-31 NOTE — Progress Notes (Signed)
This encounter was created in error - please disregard.

## 2013-09-04 ENCOUNTER — Non-Acute Institutional Stay (SKILLED_NURSING_FACILITY): Payer: Medicare Other | Admitting: Internal Medicine

## 2013-09-04 DIAGNOSIS — E119 Type 2 diabetes mellitus without complications: Secondary | ICD-10-CM

## 2013-09-04 DIAGNOSIS — I4891 Unspecified atrial fibrillation: Secondary | ICD-10-CM

## 2013-09-04 DIAGNOSIS — C50919 Malignant neoplasm of unspecified site of unspecified female breast: Secondary | ICD-10-CM

## 2013-09-04 DIAGNOSIS — J449 Chronic obstructive pulmonary disease, unspecified: Secondary | ICD-10-CM

## 2013-09-09 ENCOUNTER — Ambulatory Visit (HOSPITAL_BASED_OUTPATIENT_CLINIC_OR_DEPARTMENT_OTHER): Payer: Medicare Other | Admitting: Oncology

## 2013-09-09 ENCOUNTER — Other Ambulatory Visit (HOSPITAL_BASED_OUTPATIENT_CLINIC_OR_DEPARTMENT_OTHER): Payer: Medicare Other | Admitting: Lab

## 2013-09-09 ENCOUNTER — Telehealth: Payer: Self-pay | Admitting: Oncology

## 2013-09-09 ENCOUNTER — Encounter: Payer: Self-pay | Admitting: Oncology

## 2013-09-09 VITALS — BP 115/65 | HR 66 | Temp 98.3°F | Resp 19 | Ht 59.0 in | Wt 274.7 lb

## 2013-09-09 DIAGNOSIS — D509 Iron deficiency anemia, unspecified: Secondary | ICD-10-CM

## 2013-09-09 DIAGNOSIS — C50911 Malignant neoplasm of unspecified site of right female breast: Secondary | ICD-10-CM

## 2013-09-09 DIAGNOSIS — C50419 Malignant neoplasm of upper-outer quadrant of unspecified female breast: Secondary | ICD-10-CM

## 2013-09-09 DIAGNOSIS — D649 Anemia, unspecified: Secondary | ICD-10-CM

## 2013-09-09 DIAGNOSIS — C50111 Malignant neoplasm of central portion of right female breast: Secondary | ICD-10-CM

## 2013-09-09 DIAGNOSIS — C50119 Malignant neoplasm of central portion of unspecified female breast: Secondary | ICD-10-CM

## 2013-09-09 LAB — CBC WITH DIFFERENTIAL/PLATELET
Basophils Absolute: 0 10*3/uL (ref 0.0–0.1)
Eosinophils Absolute: 0.1 10*3/uL (ref 0.0–0.5)
HCT: 33 % — ABNORMAL LOW (ref 34.8–46.6)
HGB: 10.6 g/dL — ABNORMAL LOW (ref 11.6–15.9)
LYMPH%: 11 % — ABNORMAL LOW (ref 14.0–49.7)
MCV: 75.3 fL — ABNORMAL LOW (ref 79.5–101.0)
MONO%: 9 % (ref 0.0–14.0)
NEUT#: 4.5 10*3/uL (ref 1.5–6.5)
NEUT%: 77.3 % — ABNORMAL HIGH (ref 38.4–76.8)
Platelets: 198 10*3/uL (ref 145–400)
RDW: 20.6 % — ABNORMAL HIGH (ref 11.2–14.5)

## 2013-09-09 LAB — COMPREHENSIVE METABOLIC PANEL (CC13)
Albumin: 3.2 g/dL — ABNORMAL LOW (ref 3.5–5.0)
Alkaline Phosphatase: 98 U/L (ref 40–150)
BUN: 29.4 mg/dL — ABNORMAL HIGH (ref 7.0–26.0)
Glucose: 131 mg/dl (ref 70–140)
Potassium: 4.4 mEq/L (ref 3.5–5.1)

## 2013-09-09 NOTE — Progress Notes (Signed)
Hematology and Oncology Follow Up Visit  [name] [id number] 07-06-1941 72 y.o. 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  #2 iron deficiency anemia   PAST THERAPY:  #1 patient is a lumpectomy of the right breast in May 2012. 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 2 sentinel nodes were negative for metastatic disease.  #2 she then went on to receive radiation therapy to the breast from 03/28/2011 to 04/25/2011.  #3 she is now status post right knee replacement and is getting physical therapy.  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  Interim History:  Patient is seen in followup today overall she seems to be doing well.Overall she is doing well. She is on Aromasin which she tolerates much better. She today feels well she is using a walker. She denies any fevers chills night sweats headaches shortness of breath chest pains palpitations she has no myalgias or arthralgias she does experience some hot flashes she has no bleeding remainder of the 10 point review of systems is negative.  Medications: Aromasin 25 mg daily  Current Outpatient Prescriptions on File Prior to Visit  Medication Sig Dispense Refill  . acetaminophen (TYLENOL) 500 MG tablet Take 1,000 mg by mouth every 6 (six) hours as needed. pain      . albuterol (PROVENTIL HFA) 108 (90 BASE) MCG/ACT inhaler Inhale 2 puffs into the lungs every 6 (six) hours as needed for wheezing.      Marland Kitchen allopurinol (ZYLOPRIM) 100 MG tablet Take 200 mg by mouth daily.       Marland Kitchen apixaban (ELIQUIS) 5 MG TABS tablet Take 1 tablet (5 mg total) by mouth 2 (two) times daily.  60 tablet  3  . Ascorbic  Acid (VITAMIN C) 1000 MG tablet Take 1,000 mg by mouth daily.      . chlorpheniramine-HYDROcodone (TUSSIONEX) 10-8 MG/5ML LQCR Take 5 mLs by mouth every 12 (twelve) hours as needed.  115 mL  0  . colchicine 0.6 MG tablet Take 1 tablet (0.6 mg total) by mouth 2 (two) times daily.  60 tablet  0  . diltiazem (TIAZAC) 360 MG 24 hr capsule Take 360 mg by mouth daily.       Marland Kitchen exemestane (AROMASIN) 25 MG tablet Take 1 tablet (25 mg total) by mouth daily after breakfast.  30 tablet  12  . ferrous fumarate (HEMOCYTE - 106 MG FE) 325 (106 FE) MG TABS Take 1 tablet by mouth 3 (three) times daily.      . fexofenadine (ALLEGRA) 180 MG tablet Take 180 mg by mouth daily.      . Fluticasone-Salmeterol (ADVAIR) 250-50 MCG/DOSE AEPB Inhale 1 puff into the lungs every 12 (twelve) hours.      . furosemide (LASIX) 20 MG tablet Take 1 tablet by mouth every morning.       Marland Kitchen guaiFENesin (MUCINEX) 600 MG 12 hr tablet Take 1,200 mg by mouth 2 (two) times daily.      Marland Kitchen HUMULIN N 100 UNIT/ML injection Inject 5 Units into the skin as needed (5 units if blood sugar is greater than 150).       Marland Kitchen HYDROcodone-acetaminophen (NORCO/VICODIN) 5-325 MG per tablet Take 1 tablet by mouth  every 6 (six) hours as needed. For pain.  30 tablet  0  . insulin glargine (LANTUS) 100 UNIT/ML injection Inject 0.28 mLs (28 Units total) into the skin at bedtime.  10 mL  12  . ipratropium (ATROVENT) 0.02 % nebulizer solution Take 2.5 mLs (0.5 mg total) by nebulization every 6 (six) hours.  75 mL  0  . levalbuterol (XOPENEX) 0.63 MG/3ML nebulizer solution Take 3 mLs (0.63 mg total) by nebulization every 6 (six) hours.  3 mL  0  . LORazepam (ATIVAN) 0.5 MG tablet Take 1 tablet (0.5 mg total) by mouth at bedtime as needed. For sleep.  30 tablet  0  . meclizine (ANTIVERT) 25 MG tablet Take 6.25-12.5 mg by mouth 3 (three) times daily as needed. For dizziness.      . metFORMIN (GLUCOPHAGE) 500 MG tablet Take 500 mg by mouth 2 (two) times daily with a meal.  If cbg is greater than 120 pt takes medication      . metoprolol tartrate (LOPRESSOR) 25 MG tablet Take 25 mg by mouth 2 (two) times daily.       . Multiple Vitamin (MULTIVITAMIN WITH MINERALS) TABS Take 1 tablet by mouth daily.      . ondansetron (ZOFRAN) 4 MG tablet Take 4 mg by mouth every 8 (eight) hours as needed for nausea.      . polyethylene glycol (MIRALAX / GLYCOLAX) packet Take 17 g by mouth daily.  14 each  0  . simvastatin (ZOCOR) 40 MG tablet Take 40 mg by mouth at bedtime.       No current facility-administered medications on file prior to visit.    Allergies:  Allergies  Allergen Reactions  . Aspirin     Avoids due to being on blood thinners  . Contrast Media [Iodinated Diagnostic Agents] Nausea And Vomiting  . Ibuprofen     Avoids due to being on blood thinners  . Pravachol Other (See Comments)    Muscle pain  . Pravastatin Sodium     Past Medical History, Surgical history, Social history, and Family History were reviewed and updated.  Review of Systems: Constitutional:  Negative for fever, chills, night sweats, anorexia, weight loss, pain. Cardiovascular: no chest pain or dyspnea on exertion Respiratory: no cough, shortness of breath, or wheezing Neurological: no TIA or stroke symptoms Dermatological: negative ENT: negative Skin Gastrointestinal: no abdominal pain, change in bowel habits, or black or bloody stools Genito-Urinary: no dysuria, trouble voiding, or hematuria Hematological and Lymphatic: negative Breast: negative for breast lumps Musculoskeletal: Patient does complain of having some aches and pains in her knees which is recovering. Remaining ROS negative.  Physical Exam:General appearance: alert, cooperative, appears stated age and fatigued  Blood pressure 115/65, pulse 66, temperature 98.3 F (36.8 C), temperature source Oral, resp. rate 19, height 4\' 11"  (1.499 m), weight 274 lb 11.2 oz (124.603 kg), SpO2 96.00%. ENT exam is unremarkable  l LUNGS: are clear bilaterally to auscultation and percussion CVS: rate rhythm no murmurs gallops or rubs ABD: Soft nontender nondistended bowel sounds are present hepato/splenomegaly EXT: No edema clubbing or cyanosis BREAST: The lateral breasts reveal no masses nipple discharge. Well-healed surgical scar in the right breast NEURO: Alert oriented x3. DTR +4 motor and sensory is intact strength is symmetrical in upper and lower extremity  ECOG: 1   Lab Results: Lab Results  Component Value Date   WBC 5.8 09/09/2013   HGB 10.6* 09/09/2013   HCT 33.0* 09/09/2013   MCV 75.3* 09/09/2013  PLT 198 09/09/2013     Chemistry      Component Value Date/Time   NA 136 08/25/2013 0545   NA 141 03/08/2013 0959   K 4.1 08/25/2013 0545   K 4.0 03/08/2013 0959   CL 97 08/25/2013 0545   CL 98 03/08/2013 0959   CO2 29 08/25/2013 0545   CO2 29 03/08/2013 0959   BUN 22 08/25/2013 0545   BUN 12.6 03/08/2013 0959   CREATININE 0.63 08/25/2013 0545   CREATININE 0.84 08/16/2013 1424   CREATININE 1.1 03/08/2013 0959      Component Value Date/Time   CALCIUM 10.3 08/25/2013 0545   CALCIUM 10.6* 03/08/2013 0959   ALKPHOS 58 08/18/2013 2145   ALKPHOS 91 03/08/2013 0959   AST 15 08/18/2013 2145   AST 20 03/08/2013 0959   ALT 8 08/18/2013 2145   ALT 9 03/08/2013 0959   BILITOT 1.0 08/18/2013 2145   BILITOT 0.86 03/08/2013 0959       Radiological Studies: NONE     IMPRESSIONS AND PLAN: A 72 y.o. 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 She is on Aromasin 25 mg daily.  #2 patient will be scheduled for a bone density scan.  #3 anemia patient will continue tandem one daily for about 6 months time.  #4 patient will return to see me in 6 months time or sooner if need arises   Spent more than half the time coordinating care. The length of time of the face-to-face encounter was 15    minutes. More than 50% of time was spent counseling and coordination  of care.    Drue Second, MD Medical/Oncology [facility] [phone] (beeper) [phone] (Office)  09/09/2013, 11:11 AM 9/29/201411:11 AM

## 2013-09-09 NOTE — Patient Instructions (Addendum)
Doing well, continue Aromasin 25 mg daily  I will see you back in 6 months time for followup.

## 2013-09-13 ENCOUNTER — Encounter: Payer: Self-pay | Admitting: Adult Health

## 2013-09-20 ENCOUNTER — Ambulatory Visit (INDEPENDENT_AMBULATORY_CARE_PROVIDER_SITE_OTHER)
Admission: RE | Admit: 2013-09-20 | Discharge: 2013-09-20 | Disposition: A | Discharge status: Home / Self Care | Payer: Medicare Other | Source: Ambulatory Visit | Attending: Internal Medicine | Admitting: Internal Medicine

## 2013-09-20 DIAGNOSIS — R918 Other nonspecific abnormal finding of lung field: Secondary | ICD-10-CM

## 2013-09-23 ENCOUNTER — Other Ambulatory Visit: Payer: Self-pay | Admitting: *Deleted

## 2013-09-23 MED ORDER — LORAZEPAM 0.5 MG PO TABS: ORAL_TABLET | ORAL | Status: DC

## 2013-09-24 ENCOUNTER — Non-Acute Institutional Stay (SKILLED_NURSING_FACILITY): Payer: Medicare Other | Admitting: Adult Health

## 2013-09-24 DIAGNOSIS — I509 Heart failure, unspecified: Secondary | ICD-10-CM

## 2013-09-24 DIAGNOSIS — K5900 Constipation, unspecified: Secondary | ICD-10-CM

## 2013-09-24 DIAGNOSIS — I4891 Unspecified atrial fibrillation: Secondary | ICD-10-CM

## 2013-09-24 DIAGNOSIS — D649 Anemia, unspecified: Secondary | ICD-10-CM

## 2013-09-24 DIAGNOSIS — C50919 Malignant neoplasm of unspecified site of unspecified female breast: Secondary | ICD-10-CM

## 2013-09-24 DIAGNOSIS — M109 Gout, unspecified: Secondary | ICD-10-CM

## 2013-09-24 DIAGNOSIS — I10 Essential (primary) hypertension: Secondary | ICD-10-CM

## 2013-09-24 DIAGNOSIS — E119 Type 2 diabetes mellitus without complications: Secondary | ICD-10-CM

## 2013-09-24 DIAGNOSIS — J449 Chronic obstructive pulmonary disease, unspecified: Secondary | ICD-10-CM

## 2013-09-24 DIAGNOSIS — I5032 Chronic diastolic (congestive) heart failure: Secondary | ICD-10-CM

## 2013-09-24 DIAGNOSIS — E785 Hyperlipidemia, unspecified: Secondary | ICD-10-CM

## 2013-09-24 NOTE — Progress Notes (Signed)
Quick Note:  LMTCB ______ 

## 2013-09-26 NOTE — Progress Notes (Signed)
Quick Note:  Pt aware of results. Pt aware that we are faxing results to Dr Claud Kelp (MD she seen in past for her Right Breast Cancer). Pt will call In[facility]'s office to get appointment as well. ______

## 2013-10-08 NOTE — Progress Notes (Signed)
Patient ID: [name], female   DOB: 08-Mar-1941, 72 y.o.   MRN: [id number]        HISTORY & PHYSICAL  DATE: 09/04/2013   FACILITY: [facility]  LEVEL OF CARE: SNF (31)  ALLERGIES:  Allergies  Allergen Reactions  . Aspirin     Avoids due to being on blood thinners  . Contrast Media [Iodinated Diagnostic Agents] Nausea And Vomiting  . Ibuprofen     Avoids due to being on blood thinners  . Pravachol Other (See Comments)    Muscle pain  . Pravastatin Sodium     CHIEF COMPLAINT:  Manage atrial fibrillation, COPD, and diabetes mellitus.    HISTORY OF PRESENT ILLNESS:  The patient is a 72 year-old, Caucasian female who was hospitalized for right breast cellulitis and then admitted to this facility for short-term rehabilitation.  She has the following problems:    ATRIAL FIBRILLATION: the patients atrial fibrillation remains stable.  The patient denies DOE, tachycardia, orthopnea, transient neurological sx, pedal edema, palpitations, & PNDs.  No complications noted from the medications currently being used.    COPD: the COPD remains stable.  Pt denies sob, cough, wheezing or declining exercise tolerance.  No complications from the medications presently being used.    DM:pt's DM remains stable.  Pt denies polyuria, polydipsia, polyphagia, changes in vision or hypoglycemic episodes.  No complications noted from the medication presently being used.  Last hemoglobin A1c is:  Not available.    PAST MEDICAL HISTORY :  Past Medical History  Diagnosis Date  . Diabetes mellitus   . Asthma   . Bronchitis   . Hernia   . Anemia   . Uterine cancer     s/p hysterectomy  . Arthritis   . Hernia   . Morbid obesity   . Sarcoid   . OSA (obstructive sleep apnea)     Uses nasal CPAP Q HS  . Atrial fibrillation   . Breast cancer, stage 1 10/17/2011    s/p lumpectomy and XRT  . Cough   . Wheezing   . Chills   . Constipation   . Bruises easily   . Gout attack     ankle,  then wrist and hands  . Hyperlipidemia     PAST SURGICAL HISTORY: Past Surgical History  Procedure Laterality Date  . Total knee arthroplasty  2001  . Abdominal hysterectomy  2006  . Resection of uterine cancer    . Left lower leg fx--casted    . Breast lumpectomy Right     SOCIAL HISTORY:  reports that she has never smoked. She has never used smokeless tobacco. She reports that she does not drink alcohol or use illicit drugs.  FAMILY HISTORY:  Family History  Problem Relation Age of Onset  . Diabetes Mother   . Cancer Father     colon  . Diabetes Brother   . Cancer Sister   . Diabetes Sister     CURRENT MEDICATIONS: Reviewed per [facility]  REVIEW OF SYSTEMS:  See HPI otherwise 14 point ROS is negative.  PHYSICAL EXAMINATION  VS:  T 96.9       P 56      RR 20      BP 130/66      POX 92%        WT (Lb)  GENERAL: no acute distress, morbidly obese body habitus EYES: conjunctivae normal, sclerae normal, normal eye lids MOUTH/THROAT: lips without lesions,no lesions in the  mouth,tongue is without lesions,uvula elevates in midline NECK: supple, trachea midline, no neck masses, no thyroid tenderness, no thyromegaly LYMPHATICS: no LAN in the neck, no supraclavicular LAN RESPIRATORY: breathing is even & unlabored, BS CTAB CARDIAC: heart rate is irregular irregular, no murmur,no extra heart sounds EDEMA/VARICOSITIES: right lower extremity has +3 edema, left lower extremity has +2 edema   ARTERIAL: pedal pulses +1 bilaterally   GI:  ABDOMEN: abdomen soft, normal BS, no masses, no tenderness  LIVER/SPLEEN: no hepatomegaly, no splenomegaly MUSCULOSKELETAL: HEAD: normal to inspection & palpation BACK: no kyphosis, scoliosis or spinal processes tenderness EXTREMITIES: LEFT UPPER EXTREMITY: full range of motion, normal strength & tone RIGHT UPPER EXTREMITY:  full range of motion, normal strength & tone LEFT LOWER EXTREMITY: strength intact, range of motion moderate    RIGHT LOWER  EXTREMITY: strength intact, range of motion moderate   PSYCHIATRIC: the patient is alert & oriented to person, affect & behavior appropriate  LABS/RADIOLOGY: Right breast ultrasound:  Showed diffuse breast parenchymal edema and skin thickening, worse in the lower outer quadrant.  No abscess.    Chest x-ray:  No acute disease.    Blood culture x2 showed no growth.    Glucose 140, otherwise BMP normal.    MCV 77, otherwise CBC normal.    ASSESSMENT/PLAN:  Atrial fibrillation.  Rate controlled.    COPD.  Well compensated.    Diabetes mellitus.  Continue current medications.     Breast cancer.  Status post right lumpectomy and radiation.  Followed by oncologist.    Constipation.  Well controlled.    Check CBC and CMP.    I have reviewed patient's medical records received at admission/from hospitalization.  CPT CODE: 14782

## 2013-10-08 NOTE — Progress Notes (Signed)
Patient ID: [name], female   DOB: Apr 26, 1941, 72 y.o.   MRN: [id number]                     PROGRESS NOTE  DATE: 09/24/2013  FACILITY: Nursing Home Location: [facility]  LEVEL OF CARE: SNF (31)  Routine Visit  CHIEF COMPLAINT:  Manage CHF, COPD, Hypertension, DM and Atrial Fibrillation  HISTORY OF PRESENT ILLNESS:  REASSESSMENT OF ONGOING PROBLEM(S):  ATRIAL FIBRILLATION: the patients atrial fibrillation remains stable.  The patient denies DOE, tachycardia, orthopnea, transient neurological sx, pedal edema, palpitations, & PNDs.  No complications noted from the medications currently being used.  HTN: Pt 's HTN remains stable.  Denies CP, sob, DOE, pedal edema, headaches, dizziness or visual disturbances.  No complications from the medications currently being used.  Last BP :118/61  ANEMIA: The anemia has been stable. The patient denies fatigue, melena or hematochezia. No complications from the medications currently being used. 10/14 hgb 10.7  PAST MEDICAL HISTORY : Reviewed.  No changes.  CURRENT MEDICATIONS: [facility]  REVIEW OF SYSTEMS:  GENERAL: no change in appetite, no fatigue, no weight changes, no fever, chills or weakness RESPIRATORY: no cough, SOB, DOE, wheezing, hemoptysis CARDIAC: no chest pain, edema or palpitations GI: no abdominal pain, diarrhea, constipation, heart burn, nausea or vomiting  PHYSICAL EXAMINATION  VS:  T98.8       P78      RR22      BP118/61     POX94 %     WT280.2 (Lb)  GENERAL: no acute distress, normal body habitus EYES: conjunctivae normal, sclerae normal, normal eye lids LYMPHATICS: no LAN in the neck, no supraclavicular LAN RESPIRATORY: breathing is even & unlabored, BS CTAB CARDIAC: RRR, no murmur,no extra heart sounds, no edema GI: abdomen soft, normal BS, no masses, no tenderness, no hepatomegaly, no splenomegaly PSYCHIATRIC: the patient is alert & oriented to person, affect & behavior  appropriate  LABS/RADIOLOGY: 09/23/13 WBC 5.9 hemoglobin 10.7 hematocrit 36.3  09/14/13 WBC 8.5 hemoglobin 10.5 hematocrit 34.2 sodium 139 potassium 4.0 glucose 142 BUN 15 creatinine 0.6 calcium 9.3 09/05/13 Iron 39 TIBC 300  Ferritin 37   ASSESSMENT/PLAN:  Chronic diastolic heart failure - stable  Hypertension - well controlled  Anemia - stable; continue ferrous sulfate  COPD - stable  Breast cancer, stage I - continue Aromasin  Gout - stable  Constipation - no complaints  Hyperlipidemia - continue Zocor  Diabetes mellitus, type II - continue metformin, Lantus and Humulin N   CPT CODE: 40981

## 2013-10-08 NOTE — Progress Notes (Signed)
This encounter was created in error - please disregard.

## 2013-10-14 ENCOUNTER — Other Ambulatory Visit: Payer: Self-pay | Admitting: *Deleted

## 2013-10-14 MED ORDER — HYDROCODONE-ACETAMINOPHEN 5-325 MG PO TABS: 1.0000 | ORAL_TABLET | Freq: Four times a day (QID) | ORAL | Status: DC | PRN

## 2013-10-15 ENCOUNTER — Encounter (INDEPENDENT_AMBULATORY_CARE_PROVIDER_SITE_OTHER): Payer: Self-pay

## 2013-10-15 ENCOUNTER — Encounter (INDEPENDENT_AMBULATORY_CARE_PROVIDER_SITE_OTHER): Payer: Self-pay | Admitting: General Surgery

## 2013-10-15 ENCOUNTER — Ambulatory Visit (INDEPENDENT_AMBULATORY_CARE_PROVIDER_SITE_OTHER): Payer: Medicare Other | Admitting: General Surgery

## 2013-10-15 ENCOUNTER — Other Ambulatory Visit: Payer: Self-pay | Admitting: *Deleted

## 2013-10-15 VITALS — BP 132/78 | HR 76 | Temp 97.8°F | Resp 18 | Ht 59.0 in | Wt 260.0 lb

## 2013-10-15 DIAGNOSIS — C50911 Malignant neoplasm of unspecified site of right female breast: Secondary | ICD-10-CM

## 2013-10-15 DIAGNOSIS — C50919 Malignant neoplasm of unspecified site of unspecified female breast: Secondary | ICD-10-CM

## 2013-10-15 DIAGNOSIS — N61 Mastitis without abscess: Secondary | ICD-10-CM

## 2013-10-15 DIAGNOSIS — B369 Superficial mycosis, unspecified: Secondary | ICD-10-CM

## 2013-10-15 MED ORDER — FLUCONAZOLE 100 MG PO TABS: 100.0000 mg | ORAL_TABLET | Freq: Every day | ORAL | Status: DC

## 2013-10-15 NOTE — Progress Notes (Signed)
Patient ID: [name], female   DOB: 03/27/1941, 72 y.o.   MRN: [id number]  History: [name] was referred back to me because of a recent episode of cellulitis of the right breast.    [name] was hospitalized 3 times Sharon year for a variety of problems including pulmonary sarcoidosis, antral fibrillation. Her most recent hospitalization was in September of Sharon year and cellulitis of the right breast was felt to be her primary problem. Dr. Johny Sax provide ID consultation and managed her antibiotics  inpatient and outpatient. [name] states that the redness in the right breast has now resolved. [name] has chronic skin irritation under the breast.    On 08/19/2013 ultrasound of the right breast which is slight thickening of the right breast skin with inverted nipple. On 09/27/2003 changes CT scan of the chest which is sarcoidosis pulmonary nodules stable mediastinal adenopathy and slight thickening of the right breast with inverted nipple.   Her breast cancer history is summarized below:  [name] is now 20 months out from her right breast conservation surgery for breast cancer.  [name] underwent right partial mastectomy, reexcision margins, and sentinel node biopsy on November 09, 2011. Her final pathology showed a pathologic stage TI B., N0, receptor positive, HER-2 negative, Ki-67 8% tumor.  [name] went on to have radiation therapy and is now on Aromasin and being followed by Dr. Garey Ham.  Mammograms at [facility] in February looked good, postop changes only..[name] had a biopsy of the left breast which showed benign breast tissue and fibroadenomatoid nodules but no malignancy. [name] was told that her mammograms otherwise looked normal. .   [name] remains on Coumadin. [name] is followed by  Dr. Holley Bouche for her atrial fibrillation. [name] is followed by Fannie Knee for her sarcoidosis.    [name] has numerous medical problems and is on numerous medication. [name] is wheelchair-bound on  oxygen.  Exam: Alert and pleasant. In a wheelchair with oxygen Neck: No adenopathy or mass Heart regular rate and rhythm. Does not sound like [name] is in atrial fibrillation today. No murmur Lungs clear to auscultation no wheezes. Breast right breast reveals well-healed scar right axilla and right upper outer quadrant. There is no cellulitis to the nipple is slightly inverted which is chronic. No abnormal skin. Fungal dermatitis in the inframammary crease.  Assessment: Fungal dermatitis right breast. Sharon needs further treatment Invasive mammary carcinoma right breast, upper outer quadrant, stage TI B., N0, receptor positive, HER-2-negative, Ki-67 8% No evidence of recurrent cancer 20 months following right partial mastectomy, reexcision of margins, sentinel lymph node biopsy. Recent bacterial cellulitis right breast, resolved  Plan: Continue follow Dr. Drue Second, and continue to take her antiestrogen therapy Repeat bilateral mammograms in Feb.,  2015 and see me at that time Diflucan 100 mg by mouth daily x10 days     Avrian Delfavero M. [name], M.D., [facility] General and Minimally invasive Surgery Breast and Colorectal Surgery Office:   [phone] Pager:   [phone]

## 2013-10-15 NOTE — Patient Instructions (Signed)
The bacterial infection in your right breast appears to have resolved.  You have a fungal dermatitis below the breast in the inframammary crease. This should be treated more aggressively. You will be given a prescription for Diflucan to take one tablet a day for 10 days. This should clear it up  There is no evidence of cancer.  Obtain bilateral mammograms in February, 2015  Return to see Dr. [name] in March, 2015.  Continue to take the Aromasin and keep your followup appointment with Dr. Drue Second.

## 2013-10-30 ENCOUNTER — Other Ambulatory Visit: Payer: Medicare Other

## 2013-10-31 ENCOUNTER — Encounter: Payer: Self-pay | Admitting: Internal Medicine

## 2013-10-31 ENCOUNTER — Inpatient Hospital Stay (HOSPITAL_COMMUNITY)
Admission: EM | Admit: 2013-10-31 | Discharge: 2013-11-07 | DRG: 291 | Disposition: A | Discharge status: Skilled Nursing Facility | Payer: Medicare Other | Attending: Internal Medicine | Admitting: Internal Medicine

## 2013-10-31 ENCOUNTER — Encounter (HOSPITAL_COMMUNITY): Payer: Self-pay | Admitting: Emergency Medicine

## 2013-10-31 ENCOUNTER — Ambulatory Visit (INDEPENDENT_AMBULATORY_CARE_PROVIDER_SITE_OTHER): Payer: Medicare Other | Admitting: Internal Medicine

## 2013-10-31 ENCOUNTER — Other Ambulatory Visit (INDEPENDENT_AMBULATORY_CARE_PROVIDER_SITE_OTHER): Payer: Medicare Other

## 2013-10-31 ENCOUNTER — Ambulatory Visit (INDEPENDENT_AMBULATORY_CARE_PROVIDER_SITE_OTHER)
Admission: RE | Admit: 2013-10-31 | Discharge: 2013-10-31 | Disposition: A | Discharge status: Home / Self Care | Payer: Medicare Other | Source: Ambulatory Visit | Attending: Internal Medicine | Admitting: Internal Medicine

## 2013-10-31 VITALS — BP 118/64 | HR 65 | Ht 59.0 in | Wt 274.0 lb

## 2013-10-31 DIAGNOSIS — J42 Unspecified chronic bronchitis: Secondary | ICD-10-CM

## 2013-10-31 DIAGNOSIS — I509 Heart failure, unspecified: Secondary | ICD-10-CM | POA: Diagnosis present

## 2013-10-31 DIAGNOSIS — Z96659 Presence of unspecified artificial knee joint: Secondary | ICD-10-CM

## 2013-10-31 DIAGNOSIS — J690 Pneumonitis due to inhalation of food and vomit: Secondary | ICD-10-CM

## 2013-10-31 DIAGNOSIS — Z9981 Dependence on supplemental oxygen: Secondary | ICD-10-CM

## 2013-10-31 DIAGNOSIS — K922 Gastrointestinal hemorrhage, unspecified: Secondary | ICD-10-CM

## 2013-10-31 DIAGNOSIS — I5033 Acute on chronic diastolic (congestive) heart failure: Secondary | ICD-10-CM

## 2013-10-31 DIAGNOSIS — J441 Chronic obstructive pulmonary disease with (acute) exacerbation: Secondary | ICD-10-CM | POA: Diagnosis present

## 2013-10-31 DIAGNOSIS — E440 Moderate protein-calorie malnutrition: Secondary | ICD-10-CM | POA: Diagnosis present

## 2013-10-31 DIAGNOSIS — R600 Localized edema: Secondary | ICD-10-CM | POA: Diagnosis present

## 2013-10-31 DIAGNOSIS — B369 Superficial mycosis, unspecified: Secondary | ICD-10-CM

## 2013-10-31 DIAGNOSIS — R0600 Dyspnea, unspecified: Secondary | ICD-10-CM

## 2013-10-31 DIAGNOSIS — R5381 Other malaise: Secondary | ICD-10-CM | POA: Diagnosis present

## 2013-10-31 DIAGNOSIS — D869 Sarcoidosis, unspecified: Secondary | ICD-10-CM | POA: Diagnosis present

## 2013-10-31 DIAGNOSIS — R05 Cough: Secondary | ICD-10-CM | POA: Diagnosis present

## 2013-10-31 DIAGNOSIS — J9620 Acute and chronic respiratory failure, unspecified whether with hypoxia or hypercapnia: Secondary | ICD-10-CM | POA: Diagnosis present

## 2013-10-31 DIAGNOSIS — Z923 Personal history of irradiation: Secondary | ICD-10-CM

## 2013-10-31 DIAGNOSIS — R001 Bradycardia, unspecified: Secondary | ICD-10-CM

## 2013-10-31 DIAGNOSIS — G4733 Obstructive sleep apnea (adult) (pediatric): Secondary | ICD-10-CM | POA: Diagnosis present

## 2013-10-31 DIAGNOSIS — R0609 Other forms of dyspnea: Secondary | ICD-10-CM

## 2013-10-31 DIAGNOSIS — R059 Cough, unspecified: Secondary | ICD-10-CM | POA: Diagnosis present

## 2013-10-31 DIAGNOSIS — J4489 Other specified chronic obstructive pulmonary disease: Secondary | ICD-10-CM | POA: Diagnosis present

## 2013-10-31 DIAGNOSIS — I4891 Unspecified atrial fibrillation: Secondary | ICD-10-CM | POA: Diagnosis present

## 2013-10-31 DIAGNOSIS — I498 Other specified cardiac arrhythmias: Secondary | ICD-10-CM | POA: Diagnosis present

## 2013-10-31 DIAGNOSIS — D638 Anemia in other chronic diseases classified elsewhere: Secondary | ICD-10-CM | POA: Diagnosis present

## 2013-10-31 DIAGNOSIS — R911 Solitary pulmonary nodule: Secondary | ICD-10-CM

## 2013-10-31 DIAGNOSIS — M109 Gout, unspecified: Secondary | ICD-10-CM

## 2013-10-31 DIAGNOSIS — R609 Edema, unspecified: Secondary | ICD-10-CM | POA: Diagnosis present

## 2013-10-31 DIAGNOSIS — I482 Chronic atrial fibrillation, unspecified: Secondary | ICD-10-CM | POA: Diagnosis present

## 2013-10-31 DIAGNOSIS — I5030 Unspecified diastolic (congestive) heart failure: Secondary | ICD-10-CM

## 2013-10-31 DIAGNOSIS — Z833 Family history of diabetes mellitus: Secondary | ICD-10-CM

## 2013-10-31 DIAGNOSIS — J9611 Chronic respiratory failure with hypoxia: Secondary | ICD-10-CM

## 2013-10-31 DIAGNOSIS — E785 Hyperlipidemia, unspecified: Secondary | ICD-10-CM | POA: Diagnosis present

## 2013-10-31 DIAGNOSIS — C50911 Malignant neoplasm of unspecified site of right female breast: Secondary | ICD-10-CM

## 2013-10-31 DIAGNOSIS — N61 Mastitis without abscess: Secondary | ICD-10-CM

## 2013-10-31 DIAGNOSIS — J40 Bronchitis, not specified as acute or chronic: Secondary | ICD-10-CM

## 2013-10-31 DIAGNOSIS — Z7901 Long term (current) use of anticoagulants: Secondary | ICD-10-CM

## 2013-10-31 DIAGNOSIS — E119 Type 2 diabetes mellitus without complications: Secondary | ICD-10-CM | POA: Diagnosis present

## 2013-10-31 DIAGNOSIS — Z8542 Personal history of malignant neoplasm of other parts of uterus: Secondary | ICD-10-CM

## 2013-10-31 DIAGNOSIS — I4819 Other persistent atrial fibrillation: Secondary | ICD-10-CM

## 2013-10-31 DIAGNOSIS — C50311 Malignant neoplasm of lower-inner quadrant of right female breast: Secondary | ICD-10-CM | POA: Diagnosis present

## 2013-10-31 DIAGNOSIS — R55 Syncope and collapse: Secondary | ICD-10-CM

## 2013-10-31 DIAGNOSIS — R531 Weakness: Secondary | ICD-10-CM | POA: Diagnosis present

## 2013-10-31 DIAGNOSIS — R112 Nausea with vomiting, unspecified: Secondary | ICD-10-CM

## 2013-10-31 DIAGNOSIS — C50919 Malignant neoplasm of unspecified site of unspecified female breast: Secondary | ICD-10-CM | POA: Diagnosis present

## 2013-10-31 DIAGNOSIS — N179 Acute kidney failure, unspecified: Secondary | ICD-10-CM

## 2013-10-31 DIAGNOSIS — J449 Chronic obstructive pulmonary disease, unspecified: Secondary | ICD-10-CM | POA: Diagnosis present

## 2013-10-31 DIAGNOSIS — J99 Respiratory disorders in diseases classified elsewhere: Secondary | ICD-10-CM | POA: Diagnosis present

## 2013-10-31 DIAGNOSIS — M129 Arthropathy, unspecified: Secondary | ICD-10-CM | POA: Diagnosis present

## 2013-10-31 DIAGNOSIS — J9691 Respiratory failure, unspecified with hypoxia: Secondary | ICD-10-CM | POA: Diagnosis present

## 2013-10-31 DIAGNOSIS — J069 Acute upper respiratory infection, unspecified: Secondary | ICD-10-CM

## 2013-10-31 DIAGNOSIS — D649 Anemia, unspecified: Secondary | ICD-10-CM | POA: Diagnosis present

## 2013-10-31 DIAGNOSIS — E662 Morbid (severe) obesity with alveolar hypoventilation: Secondary | ICD-10-CM

## 2013-10-31 DIAGNOSIS — M7989 Other specified soft tissue disorders: Secondary | ICD-10-CM | POA: Diagnosis present

## 2013-10-31 HISTORY — DX: Other persistent atrial fibrillation: I48.19

## 2013-10-31 HISTORY — DX: Long term (current) use of anticoagulants: Z79.01

## 2013-10-31 LAB — CBC WITH DIFFERENTIAL/PLATELET
Basophils Absolute: 0 10*3/uL (ref 0.0–0.1)
Basophils Absolute: 0 10*3/uL (ref 0.0–0.1)
Basophils Relative: 0 % (ref 0.0–3.0)
Eosinophils Absolute: 0.4 10*3/uL (ref 0.0–0.7)
Eosinophils Relative: 3 % (ref 0–5)
HCT: 36 % (ref 36.0–46.0)
Hemoglobin: 11.3 g/dL — ABNORMAL LOW (ref 12.0–15.0)
Hemoglobin: 11.2 g/dL — ABNORMAL LOW (ref 12.0–15.0)
Lymphocytes Relative: 15.2 % (ref 12.0–46.0)
Lymphocytes Relative: 23 % (ref 12–46)
MCHC: 32.7 g/dL (ref 30.0–36.0)
MCHC: 31.1 g/dL (ref 30.0–36.0)
MCV: 80.2 fL (ref 78.0–100.0)
Monocytes Absolute: 0.6 10*3/uL (ref 0.1–1.0)
Monocytes Relative: 8 % (ref 3–12)
Neutro Abs: 4.7 10*3/uL (ref 1.7–7.7)
Neutrophils Relative %: 72.4 % (ref 43.0–77.0)
RBC: 4.5 Mil/uL (ref 3.87–5.11)
RDW: 21 % — ABNORMAL HIGH (ref 11.5–14.6)
RDW: 19 % — ABNORMAL HIGH (ref 11.5–15.5)
WBC: 7.1 10*3/uL (ref 4.0–10.5)

## 2013-10-31 LAB — POCT I-STAT TROPONIN I: Troponin i, poc: 0 ng/mL (ref 0.00–0.08)

## 2013-10-31 LAB — COMPREHENSIVE METABOLIC PANEL
ALT: 10 U/L (ref 0–35)
AST: 18 U/L (ref 0–37)
BUN: 23 mg/dL (ref 6–23)
CO2: 31 mEq/L (ref 19–32)
Calcium: 10 mg/dL (ref 8.4–10.5)
Chloride: 97 mEq/L (ref 96–112)
Creatinine, Ser: 0.83 mg/dL (ref 0.50–1.10)
GFR calc Af Amer: 79 mL/min — ABNORMAL LOW (ref >90)
GFR calc non Af Amer: 68 mL/min — ABNORMAL LOW (ref >90)
Sodium: 138 mEq/L (ref 135–145)
Total Bilirubin: 0.6 mg/dL (ref 0.3–1.2)

## 2013-10-31 LAB — PRO B NATRIURETIC PEPTIDE: Pro B Natriuretic peptide (BNP): 2189 pg/mL — ABNORMAL HIGH (ref 0–125)

## 2013-10-31 MED ORDER — FUROSEMIDE 10 MG/ML IJ SOLN
40.0000 mg | Freq: Once | INTRAMUSCULAR | Status: AC
  Administered 2013-10-31: 40 mg via INTRAVENOUS
  Filled 2013-10-31: qty 4

## 2013-10-31 MED ORDER — FUROSEMIDE 40 MG PO TABS: 40.0000 mg | ORAL_TABLET | Freq: Every day | ORAL | Status: DC

## 2013-10-31 NOTE — Patient Instructions (Signed)
Order- CXR  Dx aspiration pneumonia, chronic bronchitis, CHF  Order- lab- CBC w diff, BNP  Dx dyspnea, CHF  Recommend lasix 40 mg daily x 7 days for trial

## 2013-10-31 NOTE — Progress Notes (Signed)
Patient ID: LOUELLA MEDAGLIA, female    DOB: [birth year], 72 y.o.   MRN: [id number]  HPI  06/13/11- 79 yoF never smoker, hx of OSA on CPAP Hx Sarcoid,, PAF-chronic coumadin , Morbid obesity   Last here December 21, 2010 - Note reviewed Blames heat for lack energy. She also had right breast lumpectomy and XRT in February. Now pending right knee replacement next week.  Got good report at cardiology last week, following for her AFIb. She had stopped ritalin as ineffective for complaints of tiredness before.  Continues CPAPat 9 cwp.   08/05/2011 Follow up  Pt presents today for follow up of O2. PT recently had R. TKR on 06/20/11 w/ rehab stay. She did have some desaturations post op and was started on o2 at 2l /m . Per pt she was discharged to rehab on o2. She had swelling in right lower leg and had venous doppler that was neg for DVT. She is on chronic coumadin for hx of a fib. She says she does wear out easily and has DOE. Does fine at rest w/ no dyspnea.  Wants to see if she can get off O2. Today in office O2 sat at rest was 94%. Walking O2 sat is 92% w/out desaturations.  Denies chest pain or hemoptysis. Weight is down 6 lbs since last ov.  No increased cough or congestion  Wearing CPAP each night  She was discharge home on 08/02/11 . No records from rehab available at todays visit. Will attempt to obtain.   09/22/11-69 yoF never smoker, hx of OSA on CPAP Hx Sarcoid,, PAF-chronic coumadin , Morbid obesity She is staying on her oxygen most of the time. Consider comfortably on room air for a while at rest is is it for sleep and exertion. She is having difficulty getting around with a walker and managing her oxygen tank. Her home care company says it does not have a small portable type that would work for her. She continues her CPAP every night/Advanced. Says she has lost 22 pounds since her knee replacement surgery.  1//3/12- - 69 yoF never smoker, hx of OSA on CPAP Hx Sarcoid,, PAF-chronic coumadin ,  Morbid obesity Still having SOB, slight wheezing at times. cough-productive-clear in color; denies any fever and chills She got portable oxygen/Advanced. Comfortable with her CPAP and using it every night 3 at Has felt sick since Thanksgiving. Now she just doesn't feel she can clear her airways of mucus. She took 2 rounds of antibiotics. Using her nebulizer machine.  05/04/12- 20 yoF never smoker, hx of OSA on CPAP Hx Sarcoid,, PAF-chronic coumadin , Morbid obesity Breathing is fine as along as using O2 as needed Uses oxygen at 2 L for sleep and when needed. She may need to be requalified. Occasional minor wheeze in the mornings does not bother her enough to use her rescue inhaler.  11/02/12- 44 yoF never smoker, hx of OSA on CPAP Hx Sarcoid,, PAF-chronic coumadin , Morbid obesity FOLLOWS FOR: SOB (mostly with activity) and wheezing; Still using O2 as needed Has had flu vaccine. She is pleased to report no issues at all with no recent colds or respiratory events. She is trying to lose some weight. Using oxygen 2L/ Advanced for sleep and if needed.  06/06/13-71 yoF never smoker, hx of OSA on CPAP Hx Sarcoid,, PAFib-chronic coumadin , Morbid obesity Review of Systems-see HPI FOLLOWS FOR: Post hospital; Continues to have SOB-worse with heat Hospitalized with aspiration pneumonia and then with acute GI  bleed. Now denies abdominal pain, nausea obvious blood loss. She was left on theophylline. Still feels "washed out and tired" CT chest 05/11/13- IMPRESSION:  1. No evidence pulmonary embolism.  2. Right greater than left lower lobe predominant airspace  disease, most consistent with infection.  3. Scattered pulmonary nodules. Nonspecific, especially given the  clinical history of sarcoidosis and breast cancer. Consider short-  term CT follow-up at 3 months to exclude metastasis.  4. Thoracic adenopathy, which could be reactive or due to  sarcoidosis. Metastatic disease cannot be excluded. This  could  also be reevaluated at follow-up.  5. Small hiatal hernia.  6. 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  Original Report Authenticated By: Jeronimo Greaves, M.D  08/08/13- 50 yoF never smoker, hx of OSA on CPAP Hx Sarcoid,, PAF-chronic coumadin , Morbid obesity FOLLOWS FOR: stay indoors as much as possible due to heat; denies any wheezing, SOB, cough, or congestion. Nausea and vomiting 2 days ago and again this morning, starting after she was up and around. No blood or pain. We think aspiration caused the pneumonia for which she was hospitalized earlier this year. Oxygen 2 L/Advanced CPAP 9  10/31/13- 72 yoF never smoker, hx of OSA on CPAP Hx Sarcoid,, PAF-chronic coumadin , Morbid obesity Pt suspects pna.  Pt c/o nonprod cough, SOB, wheezing. She had right greater than left effusions on CT scan in October, addressed with diuretic. Describes cough off-and-on for 5 or 6 weeks, nonproductive. Nebulizer helps some. No fever or chest pain. Has had several rounds of antibiotics at [facility]. Continues CPAP 9 with oxygen 2 L/Advanced CT chest 09/26/13 IMPRESSION:  1. Stable small bilateral pulmonary nodules. Accordingly these were  not acute inflammatory nodules on the prior exam from May 2014.  Scattered pulmonary nodules were noted on the prior CT chest from  2002 which is not available for direct comparison. Accordingly,  these likely represent benign chronic nodules, possibly associated  with old granulomatous disease or sarcoidosis. It may be prudent to  continue to monitor with chest CT in 6-12 months.  2. New moderate right and small left pleural effusions with passive  atelectasis.  3. Lower lobe airway thickening bilaterally, causing luminal  narrowing and contributing to the underlying atelectasis in the lung  bases.  4. Skin thickening in the right breast with inverted nipple. Skin  thickening was also noted on the breast ultrasound 1 month  ago.  Differential diagnosis: Continued mastitis versus inflammatory  breast cancer. Correlate with any clinical improvement/clinical  signs. Biopsy may be warranted.  5. Mediastinal adenopathy is mostly stable, although an AP window  lymph node is slightly more prominent. This could be due to passive  congestion, sarcoidosis, or malignancy.  Electronically Signed  By: Herbie Baltimore M.D.  On: 09/20/2013 14:33   ROS-see HPI Constitutional:  No--weight loss, no-night sweats, fevers, chills, +fatigue, lassitude. HEENT:   No-  headaches, difficulty swallowing, tooth/dental problems, sore throat,       No-  sneezing, itching, ear ache, nasal congestion, post nasal drip,  CV:  No-   chest pain, orthopnea, PND, swelling in lower extremities, anasarca,  dizziness, palpitations Resp: + shortness of breath with exertion, not at rest.              No-   productive cough,  + non-productive cough,  No-  coughing up of blood.              No-   change in  color of mucus.  No- wheezing.   Skin: No-   rash or lesions. GI:  No-   heartburn, indigestion, abdominal pain, nausea, vomiting, GU:  MS:  No-   joint pain or swelling.   Neuro-  nothing unusual Psych:  No- change in mood or affect. No depression or anxiety.  No memory loss.   Objective:   Physical Exam General- Alert, Oriented, Affect-appropriate, Distress- none acute, morbidly obese, wheelchair portable oxygen 2L Skin- looks pale Lymphadenopathy- none Head- atraumatic            Eyes- Gross vision intact, PERRLA, conjunctivae clear secretions            Ears- Hearing, canals-normal            Nose- Clear, no-Septal dev, mucus, polyps, erosion, perforation             Throat- Mallampati II , mucosa clear , drainage- none, tonsils- atrophic Neck- flexible , trachea midline, no stridor , thyroid nl, carotid no bruit Chest - symmetrical excursion , unlabored           Heart/CV- nearly regular with occasional skipped , no murmur , no  gallop  , no rub, nl s1 s2                           - JVD- none , 1+ edema bilaterally/ elastic hose, stasis changes- none, varices- none           Lung-  +bilateral rhonchi/rales,  no- cough , dullness-none, rub- none           Chest wall-  Abd-  Br/ Gen/ Rectal- Not done, not indicated Extrem- cyanosis- none, clubbing, none, atrophy- none, strength- nl Neuro- grossly intact to observation

## 2013-10-31 NOTE — ED Notes (Signed)
Called to get report nurse unavailable will call back.  

## 2013-10-31 NOTE — ED Notes (Signed)
EMS called to [facility].  Found patient sitting in wheel chair A&O x4 with complaints  Of shortness of breath that started 4 days ago.  Patient Denies any pain.  Pt has a Hx of Afib. Wheezing present in upper lobes and Duoneb given by EMS.

## 2013-10-31 NOTE — ED Notes (Signed)
Bed: RESA Expected date: 10/31/13 Expected time: 8:54 PM Means of arrival: Ambulance Comments: 72 yo F  afib rate in 40s

## 2013-10-31 NOTE — ED Provider Notes (Signed)
CSN: [id number]     Arrival date & time 10/31/13  2110 History   First MD Initiated Contact with Patient 10/31/13 2129     Chief Complaint  Patient presents with  . Shortness of Breath   (Consider location/radiation/quality/duration/timing/severity/associated sxs/prior Treatment) Patient is a 72 y.o. female presenting with shortness of breath.  Shortness of Breath Associated symptoms: cough   Associated symptoms: no abdominal pain, no chest pain, no fever, no headaches and no vomiting     This a 27 are old female with history of diabetes, obstructive sleep apnea, atrial fibrillation, diastolic heart failure who presents with shortness of breath.  Patient reports that report a history of worsening shortness of breath. She reports dyspnea on exertion and orthopnea. At home she is on 2 L of oxygen at baseline. She was seen by her primary care physician today and instructed to increase her Lasix to 40 mg daily. She was recently treated for pneumonia as well. Patient denies any fevers. She does have bilateral lower extremity swelling.  Patient denies chest pain.  Past Medical History  Diagnosis Date  . Diabetes mellitus   . Asthma   . Bronchitis   . Hernia   . Anemia   . Uterine cancer     s/p hysterectomy  . Arthritis   . Hernia   . Morbid obesity   . Sarcoid   . OSA (obstructive sleep apnea)     Uses nasal CPAP Q HS  . Atrial fibrillation   . Breast cancer, stage 1 10/17/2011    s/p lumpectomy and XRT  . Cough   . Wheezing   . Chills   . Constipation   . Bruises easily   . Gout attack     ankle, then wrist and hands  . Hyperlipidemia    Past Surgical History  Procedure Laterality Date  . Total knee arthroplasty  2001  . Abdominal hysterectomy  2006  . Resection of uterine cancer    . Left lower leg fx--casted    . Breast lumpectomy Right    Family History  Problem Relation Age of Onset  . Diabetes Mother   . Cancer Father     colon  . Diabetes Brother   . Cancer  Sister   . Diabetes Sister    History  Substance Use Topics  . Smoking status: Never Smoker   . Smokeless tobacco: Never Used  . Alcohol Use: No   OB History   Grav Para Term Preterm Abortions TAB SAB Ect Mult Living                 Review of Systems  Constitutional: Negative for fever.  Respiratory: Positive for cough and shortness of breath.   Cardiovascular: Positive for leg swelling. Negative for chest pain.  Gastrointestinal: Negative for nausea, vomiting and abdominal pain.  Neurological: Negative for headaches.  All other systems reviewed and are negative.    Allergies  Aspirin; Contrast media; Ibuprofen; Pravachol; and Pravastatin sodium  Home Medications   Current Outpatient Rx  Name  Route  Sig  Dispense  Refill  . acetaminophen (TYLENOL) 500 MG tablet   Oral   Take 1,000 mg by mouth every 6 (six) hours as needed. pain         . albuterol (PROVENTIL HFA) 108 (90 BASE) MCG/ACT inhaler   Inhalation   Inhale 2 puffs into the lungs every 6 (six) hours as needed for wheezing.         Marland Kitchen allopurinol (  ZYLOPRIM) 100 MG tablet   Oral   Take 200 mg by mouth daily.          Marland Kitchen apixaban (ELIQUIS) 5 MG TABS tablet   Oral   Take 1 tablet (5 mg total) by mouth 2 (two) times daily.   60 tablet   3   . Ascorbic Acid (VITAMIN C) 1000 MG tablet   Oral   Take 1,000 mg by mouth daily.         . budesonide-formoterol (SYMBICORT) 160-4.5 MCG/ACT inhaler   Inhalation   Inhale 2 puffs into the lungs 2 (two) times daily.         . chlorpheniramine-HYDROcodone (TUSSIONEX) 10-8 MG/5ML LQCR   Oral   Take 5 mLs by mouth every 12 (twelve) hours as needed.   115 mL   0   . colchicine 0.6 MG tablet   Oral   Take 1 tablet (0.6 mg total) by mouth 2 (two) times daily.   60 tablet   0   . diltiazem (TIAZAC) 360 MG 24 hr capsule   Oral   Take 360 mg by mouth daily.          Marland Kitchen exemestane (AROMASIN) 25 MG tablet   Oral   Take 1 tablet (25 mg total) by mouth  daily after breakfast.   30 tablet   12   . ferrous fumarate (HEMOCYTE - 106 MG FE) 325 (106 FE) MG TABS   Oral   Take 1 tablet by mouth 2 (two) times daily.          . fexofenadine (ALLEGRA) 180 MG tablet   Oral   Take 180 mg by mouth daily.         . furosemide (LASIX) 40 MG tablet   Oral   Take 1 tablet (40 mg total) by mouth daily.   7 tablet   0   . guaiFENesin (MUCINEX) 600 MG 12 hr tablet   Oral   Take 1,200 mg by mouth 2 (two) times daily.         Marland Kitchen HUMULIN N 100 UNIT/ML injection   Subcutaneous   Inject 5 Units into the skin as needed (5 units if blood sugar is greater than 150).          Marland Kitchen HYDROcodone-acetaminophen (NORCO/VICODIN) 5-325 MG per tablet   Oral   Take 1 tablet by mouth every 6 (six) hours as needed. For pain.   60 tablet   0   . insulin glargine (LANTUS) 100 UNIT/ML injection   Subcutaneous   Inject 0.28 mLs (28 Units total) into the skin at bedtime.   10 mL   12   . ipratropium (ATROVENT) 0.02 % nebulizer solution   Nebulization   Take 2.5 mLs (0.5 mg total) by nebulization every 6 (six) hours.   75 mL   0   . levalbuterol (XOPENEX) 0.63 MG/3ML nebulizer solution   Nebulization   Take 3 mLs (0.63 mg total) by nebulization every 6 (six) hours.   3 mL   0   . LORazepam (ATIVAN) 0.5 MG tablet      Take one tablet by mouth every night at bedtime as needed for anxiety   30 tablet   5   . meclizine (ANTIVERT) 25 MG tablet   Oral   Take 6.25-12.5 mg by mouth 3 (three) times daily as needed. For dizziness.         . metFORMIN (GLUCOPHAGE) 500 MG tablet   Oral  Take 500 mg by mouth 2 (two) times daily with a meal. If cbg is greater than 120 pt takes medication         . metoprolol tartrate (LOPRESSOR) 25 MG tablet   Oral   Take 25 mg by mouth 2 (two) times daily.          . Multiple Vitamin (MULTIVITAMIN WITH MINERALS) TABS   Oral   Take 1 tablet by mouth daily.         . ondansetron (ZOFRAN) 4 MG tablet   Oral    Take 4 mg by mouth every 8 (eight) hours as needed for nausea.         . polyethylene glycol (MIRALAX / GLYCOLAX) packet   Oral   Take 17 g by mouth daily.   14 each   0   . simvastatin (ZOCOR) 40 MG tablet   Oral   Take 40 mg by mouth at bedtime.          BP 142/126  Temp(Src) 97.8 F (36.6 C) (Oral)  Resp 18  SpO2 99% Physical Exam  Nursing note and vitals reviewed. Constitutional: She is oriented to person, place, and time.  Obese, no acute distress,nasal cannula in place  HENT:  Head: Normocephalic and atraumatic.  Eyes: Pupils are equal, round, and reactive to light.  Neck: Neck supple.  Cardiovascular: Normal rate and normal heart sounds.   Irregular rhythm  Pulmonary/Chest: Effort normal. No respiratory distress. She has wheezes.  Coarse breath sounds bilaterally with fine crackles at the bases  Abdominal: Soft. Bowel sounds are normal. There is no tenderness.  Musculoskeletal:  2+ bilateral lower extremity edema  Neurological: She is alert and oriented to person, place, and time.  Skin: Skin is warm and dry.  Psychiatric: She has a normal mood and affect.    ED Course  Procedures (including critical care time) Labs Review Labs Reviewed  CBC WITH DIFFERENTIAL - Abnormal; Notable for the following:    Hemoglobin 11.2 (*)    MCH 24.9 (*)    RDW 19.0 (*)    All other components within normal limits  COMPREHENSIVE METABOLIC PANEL - Abnormal; Notable for the following:    Glucose, Bld 175 (*)    GFR calc non Af Amer 68 (*)    GFR calc Af Amer 79 (*)    All other components within normal limits  PRO B NATRIURETIC PEPTIDE - Abnormal; Notable for the following:    Pro B Natriuretic peptide (BNP) 2189.0 (*)    All other components within normal limits  URINALYSIS, ROUTINE W REFLEX MICROSCOPIC  POCT I-STAT TROPONIN I   Imaging Review Dg Chest 2 View  10/31/2013   CLINICAL DATA:  Aspiration.  Cough.  EXAM: CHEST  2 VIEW  COMPARISON:  Chest CT 09/20/2013.   Chest x-ray 08/18/2013.  FINDINGS: Mediastinum and hilar structures are stable. Heart size is normal. Pulmonary vascularity is stable. There is a right sided pleural effusion. Atelectasis versus infiltrates noted in the lung bases. Similar findings noted on recent chest CT. Degenerative changes thoracic spine.  IMPRESSION: 1. Right pleural effusion. 2. Bibasilar atelectasis versus infiltrates. Similar findings noted on recent chest CT of 09/20/2013. These findings are new from prior chest x-ray of 08/18/2013 .   Electronically Signed   By: Maisie Fus  Register   On: 10/31/2013 15:42    EKG Interpretation   None      EKG independently reviewed by myself: Atrial fibrillation with a rate of 40, no  evidence of ST elevation or ischemia, rate is markedly slower than prior EKG MDM   1. Diastolic heart failure   2. Atrial fibrillation    Patient presents with shortness of breath and bradycardia. EKG shows slow A. fib. Patient is on diltiazem and metoprolol but has not had any recent medication changes. Clinically patient is volume overloaded and has evidence of heart failure. Lab work is notable for BNP of 2189. X-ray was reviewed from earlier today and shows a pleural effusion. Patient does not have any signs or symptoms of infection at this time including fever. Patient was given 40 mg of IV Lasix. I discussed the patient with Dr. Terressa Koyanagi from cardiology increased that the patient's tilt and metoprolol should be held overnight. She will be admitted for diuresis and telemetry monitoring.    Shon Baton, MD 10/31/13 [phone]

## 2013-11-01 ENCOUNTER — Encounter (HOSPITAL_COMMUNITY): Payer: Self-pay | Admitting: *Deleted

## 2013-11-01 DIAGNOSIS — I498 Other specified cardiac arrhythmias: Secondary | ICD-10-CM

## 2013-11-01 DIAGNOSIS — J9610 Chronic respiratory failure, unspecified whether with hypoxia or hypercapnia: Secondary | ICD-10-CM

## 2013-11-01 DIAGNOSIS — D649 Anemia, unspecified: Secondary | ICD-10-CM

## 2013-11-01 DIAGNOSIS — I4819 Other persistent atrial fibrillation: Secondary | ICD-10-CM

## 2013-11-01 DIAGNOSIS — R0902 Hypoxemia: Secondary | ICD-10-CM

## 2013-11-01 DIAGNOSIS — Z7901 Long term (current) use of anticoagulants: Secondary | ICD-10-CM

## 2013-11-01 DIAGNOSIS — J449 Chronic obstructive pulmonary disease, unspecified: Secondary | ICD-10-CM

## 2013-11-01 DIAGNOSIS — I059 Rheumatic mitral valve disease, unspecified: Secondary | ICD-10-CM

## 2013-11-01 DIAGNOSIS — I4891 Unspecified atrial fibrillation: Secondary | ICD-10-CM

## 2013-11-01 DIAGNOSIS — E119 Type 2 diabetes mellitus without complications: Secondary | ICD-10-CM

## 2013-11-01 DIAGNOSIS — I5030 Unspecified diastolic (congestive) heart failure: Secondary | ICD-10-CM

## 2013-11-01 DIAGNOSIS — I509 Heart failure, unspecified: Secondary | ICD-10-CM

## 2013-11-01 DIAGNOSIS — R001 Bradycardia, unspecified: Secondary | ICD-10-CM | POA: Diagnosis present

## 2013-11-01 DIAGNOSIS — I482 Chronic atrial fibrillation, unspecified: Secondary | ICD-10-CM | POA: Diagnosis present

## 2013-11-01 HISTORY — DX: Other persistent atrial fibrillation: I48.19

## 2013-11-01 HISTORY — DX: Long term (current) use of anticoagulants: Z79.01

## 2013-11-01 LAB — BASIC METABOLIC PANEL
BUN: 21 mg/dL (ref 6–23)
CO2: 33 mEq/L — ABNORMAL HIGH (ref 19–32)
Calcium: 9.7 mg/dL (ref 8.4–10.5)
Chloride: 99 mEq/L (ref 96–112)
Creatinine, Ser: 0.76 mg/dL (ref 0.50–1.10)
GFR calc Af Amer: >90 mL/min (ref >90)

## 2013-11-01 LAB — GLUCOSE, CAPILLARY
Glucose-Capillary: 135 mg/dL — ABNORMAL HIGH (ref 70–99)
Glucose-Capillary: 114 mg/dL — ABNORMAL HIGH (ref 70–99)
Glucose-Capillary: 121 mg/dL — ABNORMAL HIGH (ref 70–99)
Glucose-Capillary: 95 mg/dL (ref 70–99)

## 2013-11-01 LAB — CBC
HCT: 34.3 % — ABNORMAL LOW (ref 36.0–46.0)
MCHC: 30.9 g/dL (ref 30.0–36.0)
MCV: 79.8 fL (ref 78.0–100.0)
Platelets: 163 10*3/uL (ref 150–400)
RDW: 19.2 % — ABNORMAL HIGH (ref 11.5–15.5)

## 2013-11-01 LAB — URINALYSIS, ROUTINE W REFLEX MICROSCOPIC
Hgb urine dipstick: NEGATIVE
Leukocytes, UA: NEGATIVE
Nitrite: NEGATIVE
Protein, ur: NEGATIVE mg/dL
Specific Gravity, Urine: 1.022 (ref 1.005–1.030)
Urobilinogen, UA: 0.2 mg/dL (ref 0.0–1.0)

## 2013-11-01 LAB — TROPONIN I
Troponin I: <0.3 ng/mL (ref <0.30)
Troponin I: <0.3 ng/mL (ref <0.30)

## 2013-11-01 LAB — MRSA PCR SCREENING: MRSA by PCR: NEGATIVE

## 2013-11-01 MED ORDER — FERROUS FUMARATE 325 (106 FE) MG PO TABS
1.0000 | ORAL_TABLET | Freq: Two times a day (BID) | ORAL | Status: DC
  Administered 2013-11-01 – 2013-11-07 (×13): 106 mg via ORAL
  Filled 2013-11-01 (×15): qty 1

## 2013-11-01 MED ORDER — LEVALBUTEROL HCL 0.63 MG/3ML IN NEBU
0.6300 mg | INHALATION_SOLUTION | Freq: Four times a day (QID) | RESPIRATORY_TRACT | Status: DC
  Administered 2013-11-01 – 2013-11-05 (×15): 0.63 mg via RESPIRATORY_TRACT
  Filled 2013-11-01 (×28): qty 3

## 2013-11-01 MED ORDER — POTASSIUM CHLORIDE CRYS ER 20 MEQ PO TBCR
20.0000 meq | EXTENDED_RELEASE_TABLET | Freq: Every day | ORAL | Status: DC
  Administered 2013-11-01 – 2013-11-07 (×7): 20 meq via ORAL
  Filled 2013-11-01 (×7): qty 1

## 2013-11-01 MED ORDER — IPRATROPIUM BROMIDE 0.02 % IN SOLN
0.5000 mg | Freq: Four times a day (QID) | RESPIRATORY_TRACT | Status: DC
  Administered 2013-11-01 – 2013-11-05 (×15): 0.5 mg via RESPIRATORY_TRACT
  Filled 2013-11-01 (×15): qty 2.5

## 2013-11-01 MED ORDER — INSULIN GLARGINE 100 UNIT/ML ~~LOC~~ SOLN
28.0000 [IU] | Freq: Every day | SUBCUTANEOUS | Status: DC
  Administered 2013-11-01 – 2013-11-06 (×6): 28 [IU] via SUBCUTANEOUS
  Filled 2013-11-01 (×9): qty 0.28

## 2013-11-01 MED ORDER — ALBUTEROL SULFATE (5 MG/ML) 0.5% IN NEBU
2.5000 mg | INHALATION_SOLUTION | Freq: Four times a day (QID) | RESPIRATORY_TRACT | Status: DC
  Administered 2013-11-01: 2.5 mg via RESPIRATORY_TRACT

## 2013-11-01 MED ORDER — FUROSEMIDE 10 MG/ML IJ SOLN
40.0000 mg | Freq: Two times a day (BID) | INTRAMUSCULAR | Status: DC
  Administered 2013-11-01 – 2013-11-05 (×9): 40 mg via INTRAVENOUS
  Filled 2013-11-01 (×12): qty 4

## 2013-11-01 MED ORDER — IPRATROPIUM BROMIDE 0.02 % IN SOLN
0.5000 mg | Freq: Four times a day (QID) | RESPIRATORY_TRACT | Status: DC
  Administered 2013-11-01: 02:00:00 0.5 mg via RESPIRATORY_TRACT

## 2013-11-01 MED ORDER — COLCHICINE 0.6 MG PO TABS
0.6000 mg | ORAL_TABLET | Freq: Two times a day (BID) | ORAL | Status: DC
  Administered 2013-11-01 – 2013-11-07 (×13): 0.6 mg via ORAL
  Filled 2013-11-01 (×14): qty 1

## 2013-11-01 MED ORDER — APIXABAN 5 MG PO TABS
5.0000 mg | ORAL_TABLET | Freq: Two times a day (BID) | ORAL | Status: DC
  Administered 2013-11-01 – 2013-11-07 (×13): 5 mg via ORAL
  Filled 2013-11-01 (×16): qty 1

## 2013-11-01 MED ORDER — LISINOPRIL 20 MG PO TABS
20.0000 mg | ORAL_TABLET | Freq: Every day | ORAL | Status: DC
  Administered 2013-11-01 – 2013-11-06 (×5): 20 mg via ORAL
  Filled 2013-11-01 (×7): qty 1

## 2013-11-01 MED ORDER — SODIUM CHLORIDE 0.9 % IJ SOLN
3.0000 mL | INTRAMUSCULAR | Status: DC | PRN
  Administered 2013-11-03: 3 mL via INTRAVENOUS

## 2013-11-01 MED ORDER — SODIUM CHLORIDE 0.9 % IV SOLN: 250.0000 mL | INTRAVENOUS | Status: DC | PRN

## 2013-11-01 MED ORDER — ALBUTEROL SULFATE HFA 108 (90 BASE) MCG/ACT IN AERS: 2.0000 | INHALATION_SPRAY | Freq: Four times a day (QID) | RESPIRATORY_TRACT | Status: DC | PRN

## 2013-11-01 MED ORDER — SODIUM CHLORIDE 0.9 % IJ SOLN
3.0000 mL | Freq: Two times a day (BID) | INTRAMUSCULAR | Status: DC
  Administered 2013-11-01 – 2013-11-07 (×14): 3 mL via INTRAVENOUS

## 2013-11-01 MED ORDER — GUAIFENESIN ER 600 MG PO TB12
1200.0000 mg | ORAL_TABLET | Freq: Two times a day (BID) | ORAL | Status: DC
  Administered 2013-11-01 – 2013-11-07 (×14): 1200 mg via ORAL
  Filled 2013-11-01 (×15): qty 2

## 2013-11-01 MED ORDER — LEVALBUTEROL HCL 0.63 MG/3ML IN NEBU: 0.6300 mg | INHALATION_SOLUTION | RESPIRATORY_TRACT | Status: DC | PRN

## 2013-11-01 MED ORDER — ALLOPURINOL 100 MG PO TABS
200.0000 mg | ORAL_TABLET | Freq: Every day | ORAL | Status: DC
  Administered 2013-11-01 – 2013-11-07 (×7): 200 mg via ORAL
  Filled 2013-11-01 (×7): qty 2

## 2013-11-01 MED ORDER — HYDROCOD POLST-CHLORPHEN POLST 10-8 MG/5ML PO LQCR
5.0000 mL | Freq: Two times a day (BID) | ORAL | Status: DC | PRN
  Administered 2013-11-01 – 2013-11-07 (×4): 5 mL via ORAL
  Filled 2013-11-01 (×4): qty 5

## 2013-11-01 MED ORDER — LORAZEPAM 0.5 MG PO TABS
0.5000 mg | ORAL_TABLET | Freq: Every day | ORAL | Status: DC
  Administered 2013-11-01 – 2013-11-06 (×7): 0.5 mg via ORAL
  Filled 2013-11-01 (×7): qty 1

## 2013-11-01 MED ORDER — ADULT MULTIVITAMIN W/MINERALS CH
1.0000 | ORAL_TABLET | Freq: Every day | ORAL | Status: DC
  Administered 2013-11-01 – 2013-11-07 (×7): 1 via ORAL
  Filled 2013-11-01 (×7): qty 1

## 2013-11-01 MED ORDER — LORATADINE 10 MG PO TABS
10.0000 mg | ORAL_TABLET | Freq: Every day | ORAL | Status: DC
  Administered 2013-11-01 – 2013-11-07 (×7): 10 mg via ORAL
  Filled 2013-11-01 (×7): qty 1

## 2013-11-01 MED ORDER — ACETAMINOPHEN 325 MG PO TABS: 650.0000 mg | ORAL_TABLET | ORAL | Status: DC | PRN

## 2013-11-01 MED ORDER — HYDROCODONE-ACETAMINOPHEN 5-325 MG PO TABS
1.0000 | ORAL_TABLET | Freq: Four times a day (QID) | ORAL | Status: DC | PRN
  Administered 2013-11-01 – 2013-11-06 (×12): 1 via ORAL
  Filled 2013-11-01 (×12): qty 1

## 2013-11-01 MED ORDER — EXEMESTANE 25 MG PO TABS
25.0000 mg | ORAL_TABLET | Freq: Every day | ORAL | Status: DC
  Administered 2013-11-01 – 2013-11-07 (×7): 25 mg via ORAL
  Filled 2013-11-01 (×8): qty 1

## 2013-11-01 MED ORDER — ONDANSETRON HCL 4 MG/2ML IJ SOLN: 4.0000 mg | Freq: Four times a day (QID) | INTRAMUSCULAR | Status: DC | PRN

## 2013-11-01 MED ORDER — BUDESONIDE-FORMOTEROL FUMARATE 160-4.5 MCG/ACT IN AERO
2.0000 | INHALATION_SPRAY | Freq: Two times a day (BID) | RESPIRATORY_TRACT | Status: DC
  Administered 2013-11-01 – 2013-11-07 (×14): 2 via RESPIRATORY_TRACT
  Filled 2013-11-01: qty 6

## 2013-11-01 MED ORDER — ONDANSETRON HCL 4 MG PO TABS: 4.0000 mg | ORAL_TABLET | Freq: Three times a day (TID) | ORAL | Status: DC | PRN

## 2013-11-01 MED ORDER — SIMVASTATIN 40 MG PO TABS
40.0000 mg | ORAL_TABLET | Freq: Every day | ORAL | Status: DC
  Administered 2013-11-01 – 2013-11-03 (×3): 40 mg via ORAL
  Filled 2013-11-01 (×4): qty 1

## 2013-11-01 MED ORDER — VITAMIN C 500 MG PO TABS
1000.0000 mg | ORAL_TABLET | Freq: Every day | ORAL | Status: DC
  Administered 2013-11-01 – 2013-11-07 (×7): 1000 mg via ORAL
  Filled 2013-11-01 (×7): qty 2

## 2013-11-01 MED ORDER — INSULIN ASPART 100 UNIT/ML ~~LOC~~ SOLN
0.0000 [IU] | Freq: Three times a day (TID) | SUBCUTANEOUS | Status: DC
  Administered 2013-11-01 – 2013-11-02 (×2): 2 [IU] via SUBCUTANEOUS
  Administered 2013-11-03: 3 [IU] via SUBCUTANEOUS
  Administered 2013-11-03: 2 [IU] via SUBCUTANEOUS
  Administered 2013-11-04: 5 [IU] via SUBCUTANEOUS
  Administered 2013-11-04: 17:00:00 2 [IU] via SUBCUTANEOUS
  Administered 2013-11-06: 08:00:00 0 [IU] via SUBCUTANEOUS
  Administered 2013-11-06 (×2): 2 [IU] via SUBCUTANEOUS
  Administered 2013-11-07: 3 [IU] via SUBCUTANEOUS

## 2013-11-01 MED ORDER — MECLIZINE HCL 12.5 MG PO TABS
6.2500 mg | ORAL_TABLET | Freq: Three times a day (TID) | ORAL | Status: DC | PRN
  Filled 2013-11-01: qty 1

## 2013-11-01 MED ORDER — POLYETHYLENE GLYCOL 3350 17 G PO PACK
17.0000 g | PACK | Freq: Every day | ORAL | Status: DC
  Administered 2013-11-01 – 2013-11-07 (×7): 17 g via ORAL
  Filled 2013-11-01 (×7): qty 1

## 2013-11-01 MED ORDER — ACETAMINOPHEN 500 MG PO TABS: 1000.0000 mg | ORAL_TABLET | Freq: Four times a day (QID) | ORAL | Status: DC | PRN

## 2013-11-01 NOTE — Clinical Social Work Psychosocial (Signed)
     Clinical Social Work Department BRIEF PSYCHOSOCIAL ASSESSMENT 11/01/2013  Patient:  MARISHKA, RENTFROW     Account Number:  [id number]     Admit date:  10/31/2013  Clinical Social Worker:  Hattie Perch  Date/Time:  11/01/2013 12:00 M  Referred by:  Physician  Date Referred:  11/01/2013 Referred for  SNF Placement   Other Referral:   Interview type:  Patient Other interview type:   family at bedside    PSYCHOSOCIAL DATA Living Status:  FACILITY Admitted from facility:  [facility] Level of care:  Skilled Nursing Facility Primary support name:  exa bomba Primary support relationship to patient:  CHILD, ADULT Degree of support available:   good    CURRENT CONCERNS Current Concerns  Post-Acute Placement   Other Concerns:    SOCIAL WORK ASSESSMENT / PLAN CSW met with patient. patient is alert and oriented X3. patient came in from [facility]. patient confirms that she has been at [facility] for rehab and is happy with her care there. she states that she will return there upon discharge.   Assessment/plan status:   Other assessment/ plan:   Information/referral to community resources:    PATIENTS/FAMILYS RESPONSE TO PLAN OF CARE: patient is cheerful and upbeat. she is happy with her care at [facility] and is happy to return there upon discharge.

## 2013-11-01 NOTE — Consult Note (Signed)
Reason for Consult: Acute CHF  Referring Physician: Dr. Janee Morn  NGE:[id number], Chrissie Noa, MD  Primary Cardiologist:Dr. [facility] [name] is an 72 y.o. female.    Chief Complaint: Pt admitted early this AM with  SOB and HR of 40 at SNF  HPI: 72 year old female with history of COPD with chronic hypoxic respiratory failure on 2 L O2 via nasal cannula, OSA on CPAP, sarcoidosis, history of breast cancer status post lumpectomy and radiation therapy (follows with Dr. [name]), diabetes mellitus, anemia, persistent/permanent A. fib on Eliquis.  She was sent to the hospital from skilled nursing facility as she was found to be bradycardic in the 40s. Patient reports being short of breath on exertion progressively over the last 1 month. She does have baseline 2 pillow orthopnea. She reports dyspnea on minimal exertion but denies PND. She also has increased leg swelling for few weeks. She went to see her pulmonologist today who increased her Lasix dose to 40 mg by mouth twice a day but had not started taking the medicine. She does have cough with whitish phlegm and wheezing which she reports to be chronic. Denies any fever, chills, headache, blurred vision, chest pain, palpitations, abdominal pain, nausea, vomiting, bowel or urinary symptoms. Denies any sick contacts. Patient unaware of any weight gain. At baseline patient ambulates with the help of a walker.    Troponin negative, Pro BNP elevated at 2189, mild anemia.  Rt pl. Effusion on CXR. ECHO done today:   Left ventricle: The cavity size was normal. Wall thickness was normal. Systolic function was normal. The estimated ejection fraction was in the range of 55% to 60%. Wall motion was normal; there were no regional wall motion abnormalities. Doppler parameters are consistent with elevated mean left atrial filling pressure. - Mitral valve: Calcified annulus. Mild regurgitation. Valve area by continuity equation (using LVOT flow):  2.53cm^2. - Left atrium: The atrium was moderately dilated. - Right ventricle: The cavity size was moderately dilated. - Right atrium: The atrium was mildly dilated. - Pulmonary arteries: Systolic pressure was mildly increased. PA peak pressure: 46mm Hg (S). - Pericardium, extracardiac: A trivial pericardial effusion was identified.  Previous echo 2009 EF normal and essentially normal echo. Persantine Myoview 02/2010 EF 79% no ischemia.  Past Medical History  Diagnosis Date  . Diabetes mellitus   . Asthma   . Bronchitis   . Hernia   . Anemia   . Uterine cancer     s/p hysterectomy  . Arthritis   . Hernia   . Morbid obesity   . Sarcoid   . OSA (obstructive sleep apnea)     Uses nasal CPAP Q HS  . Atrial fibrillation   . Breast cancer, stage 1 10/17/2011    s/p lumpectomy and XRT  . Cough   . Wheezing   . Chills   . Constipation   . Bruises easily   . Gout attack     ankle, then wrist and hands  . Hyperlipidemia   . Atrial fibrillation, persistent 11/01/2013  . Chronic anticoagulation, on Eliquis 11/01/2013    Past Surgical History  Procedure Laterality Date  . Total knee arthroplasty  2001  . Abdominal hysterectomy  2006  . Resection of uterine cancer    . Left lower leg fx--casted    . Breast lumpectomy Right     Family History  Problem Relation Age of Onset  . Diabetes Mother   . Cancer Father  colon  . Diabetes Brother   . Cancer Sister   . Diabetes Sister    Social History:  reports that she has never smoked. She has never used smokeless tobacco. She reports that she does not drink alcohol or use illicit drugs.  Allergies:  Allergies  Allergen Reactions  . Aspirin     Avoids due to being on blood thinners  . Contrast Media [Iodinated Diagnostic Agents] Nausea And Vomiting  . Ibuprofen     Avoids due to being on blood thinners  . Pravachol Other (See Comments)    Muscle pain  . Pravastatin Sodium     Medications Prior to Admission    Medication Sig Dispense Refill  . acetaminophen (TYLENOL) 500 MG tablet Take 1,000 mg by mouth every 6 (six) hours as needed. pain      . albuterol (PROVENTIL HFA) 108 (90 BASE) MCG/ACT inhaler Inhale 2 puffs into the lungs every 6 (six) hours as needed for wheezing.      Marland Kitchen allopurinol (ZYLOPRIM) 100 MG tablet Take 200 mg by mouth daily.       Marland Kitchen apixaban (ELIQUIS) 5 MG TABS tablet Take 1 tablet (5 mg total) by mouth 2 (two) times daily.  60 tablet  3  . Ascorbic Acid (VITAMIN C) 1000 MG tablet Take 1,000 mg by mouth daily.      . budesonide-formoterol (SYMBICORT) 160-4.5 MCG/ACT inhaler Inhale 2 puffs into the lungs 2 (two) times daily.      . chlorpheniramine-HYDROcodone (TUSSIONEX) 10-8 MG/5ML LQCR Take 5 mLs by mouth every 12 (twelve) hours as needed.  115 mL  0  . colchicine 0.6 MG tablet Take 1 tablet (0.6 mg total) by mouth 2 (two) times daily.  60 tablet  0  . diltiazem (TIAZAC) 360 MG 24 hr capsule Take 360 mg by mouth daily.       Marland Kitchen exemestane (AROMASIN) 25 MG tablet Take 1 tablet (25 mg total) by mouth daily after breakfast.  30 tablet  12  . ferrous fumarate (HEMOCYTE - 106 MG FE) 325 (106 FE) MG TABS Take 1 tablet by mouth 2 (two) times daily.       . fexofenadine (ALLEGRA) 180 MG tablet Take 180 mg by mouth daily.      . furosemide (LASIX) 40 MG tablet Take 1 tablet (40 mg total) by mouth daily.  7 tablet  0  . guaiFENesin (MUCINEX) 600 MG 12 hr tablet Take 1,200 mg by mouth 2 (two) times daily.      Marland Kitchen HUMULIN N 100 UNIT/ML injection Inject 5 Units into the skin as needed (5 units if blood sugar is greater than 150).       Marland Kitchen HYDROcodone-acetaminophen (NORCO/VICODIN) 5-325 MG per tablet Take 1 tablet by mouth every 6 (six) hours as needed. For pain.  60 tablet  0  . insulin glargine (LANTUS) 100 UNIT/ML injection Inject 0.28 mLs (28 Units total) into the skin at bedtime.  10 mL  12  . ipratropium (ATROVENT) 0.02 % nebulizer solution Take 2.5 mLs (0.5 mg total) by nebulization every 6  (six) hours.  75 mL  0  . levalbuterol (XOPENEX) 0.63 MG/3ML nebulizer solution Take 3 mLs (0.63 mg total) by nebulization every 6 (six) hours.  3 mL  0  . LORazepam (ATIVAN) 0.5 MG tablet Take one tablet by mouth every night at bedtime as needed for anxiety  30 tablet  5  . meclizine (ANTIVERT) 25 MG tablet Take 6.25-12.5 mg by mouth 3 (three)  times daily as needed. For dizziness.      . metFORMIN (GLUCOPHAGE) 500 MG tablet Take 500 mg by mouth 2 (two) times daily with a meal. If cbg is greater than 120 pt takes medication      . metoprolol tartrate (LOPRESSOR) 25 MG tablet Take 25 mg by mouth 2 (two) times daily.       . Multiple Vitamin (MULTIVITAMIN WITH MINERALS) TABS Take 1 tablet by mouth daily.      . ondansetron (ZOFRAN) 4 MG tablet Take 4 mg by mouth every 8 (eight) hours as needed for nausea.      . polyethylene glycol (MIRALAX / GLYCOLAX) packet Take 17 g by mouth daily.  14 each  0  . simvastatin (ZOCOR) 40 MG tablet Take 40 mg by mouth at bedtime.        Results for orders placed during the hospital encounter of 10/31/13 (from the past 48 hour(s))  CBC WITH DIFFERENTIAL     Status: Abnormal   Collection Time    10/31/13 10:10 PM      Result Value Range   WBC 7.1  4.0 - 10.5 K/uL   RBC 4.49  3.87 - 5.11 MIL/uL   Hemoglobin 11.2 (*) 12.0 - 15.0 g/dL   HCT 78.2  95.6 - 21.3 %   MCV 80.2  78.0 - 100.0 fL   MCH 24.9 (*) 26.0 - 34.0 pg   MCHC 31.1  30.0 - 36.0 g/dL   RDW 08.6 (*) 57.8 - 46.9 %   Platelets 194  150 - 400 K/uL   Neutrophils Relative % 66  43 - 77 %   Neutro Abs 4.7  1.7 - 7.7 K/uL   Lymphocytes Relative 23  12 - 46 %   Lymphs Abs 1.6  0.7 - 4.0 K/uL   Monocytes Relative 8  3 - 12 %   Monocytes Absolute 0.6  0.1 - 1.0 K/uL   Eosinophils Relative 3  0 - 5 %   Eosinophils Absolute 0.2  0.0 - 0.7 K/uL   Basophils Relative 0  0 - 1 %   Basophils Absolute 0.0  0.0 - 0.1 K/uL  COMPREHENSIVE METABOLIC PANEL     Status: Abnormal   Collection Time    10/31/13 10:10  PM      Result Value Range   Sodium 138  135 - 145 mEq/L   Potassium 3.9  3.5 - 5.1 mEq/L   Chloride 97  96 - 112 mEq/L   CO2 31  19 - 32 mEq/L   Glucose, Bld 175 (*) 70 - 99 mg/dL   BUN 23  6 - 23 mg/dL   Creatinine, Ser 6.29  0.50 - 1.10 mg/dL   Calcium 52.8  8.4 - 41.3 mg/dL   Total Protein 7.1  6.0 - 8.3 g/dL   Albumin 3.9  3.5 - 5.2 g/dL   AST 18  0 - 37 U/L   ALT 10  0 - 35 U/L   Alkaline Phosphatase 91  39 - 117 U/L   Total Bilirubin 0.6  0.3 - 1.2 mg/dL   GFR calc non Af Amer 68 (*) >90 mL/min   GFR calc Af Amer 79 (*) >90 mL/min   Comment: (NOTE)     The eGFR has been calculated using the CKD EPI equation.     This calculation has not been validated in all clinical situations.     eGFR's persistently <90 mL/min signify possible Chronic Kidney  Disease.  PRO B NATRIURETIC PEPTIDE     Status: Abnormal   Collection Time    10/31/13 10:10 PM      Result Value Range   Pro B Natriuretic peptide (BNP) 2189.0 (*) 0 - 125 pg/mL  POCT I-STAT TROPONIN I     Status: None   Collection Time    10/31/13 10:17 PM      Result Value Range   Troponin i, poc 0.00  0.00 - 0.08 ng/mL   Comment 3            Comment: Due to the release kinetics of cTnI,     a negative result within the first hours     of the onset of symptoms does not rule out     myocardial infarction with certainty.     If myocardial infarction is still suspected,     repeat the test at appropriate intervals.  URINALYSIS, ROUTINE W REFLEX MICROSCOPIC     Status: None   Collection Time    10/31/13 11:27 PM      Result Value Range   Color, Urine YELLOW  YELLOW   APPearance CLEAR  CLEAR   Specific Gravity, Urine 1.022  1.005 - 1.030   pH 5.5  5.0 - 8.0   Glucose, UA NEGATIVE  NEGATIVE mg/dL   Hgb urine dipstick NEGATIVE  NEGATIVE   Bilirubin Urine NEGATIVE  NEGATIVE   Ketones, ur NEGATIVE  NEGATIVE mg/dL   Protein, ur NEGATIVE  NEGATIVE mg/dL   Urobilinogen, UA 0.2  0.0 - 1.0 mg/dL   Nitrite NEGATIVE   NEGATIVE   Leukocytes, UA NEGATIVE  NEGATIVE   Comment: MICROSCOPIC NOT DONE ON URINES WITH NEGATIVE PROTEIN, BLOOD, LEUKOCYTES, NITRITE, OR GLUCOSE <1000 mg/dL.  GLUCOSE, CAPILLARY     Status: Abnormal   Collection Time    11/01/13  1:06 AM      Result Value Range   Glucose-Capillary 135 (*) 70 - 99 mg/dL   Comment 1 Documented in Chart     Comment 2 Notify RN    MRSA PCR SCREENING     Status: None   Collection Time    11/01/13  3:35 AM      Result Value Range   MRSA by PCR NEGATIVE  NEGATIVE   Comment:            The GeneXpert MRSA Assay (FDA     approved for NASAL specimens     only), is one component of a     comprehensive MRSA colonization     surveillance program. It is not     intended to diagnose MRSA     infection nor to guide or     monitor treatment for     MRSA infections.  GLUCOSE, CAPILLARY     Status: Abnormal   Collection Time    11/01/13  7:21 AM      Result Value Range   Glucose-Capillary 114 (*) 70 - 99 mg/dL  BASIC METABOLIC PANEL     Status: Abnormal   Collection Time    11/01/13  7:55 AM      Result Value Range   Sodium 139  135 - 145 mEq/L   Potassium 3.4 (*) 3.5 - 5.1 mEq/L   Chloride 99  96 - 112 mEq/L   CO2 33 (*) 19 - 32 mEq/L   Glucose, Bld 130 (*) 70 - 99 mg/dL   BUN 21  6 - 23 mg/dL   Creatinine, Ser 1.61  0.50 - 1.10 mg/dL   Calcium 9.7  8.4 - 16.1 mg/dL   GFR calc non Af Amer 81 (*) >90 mL/min   GFR calc Af Amer >90  >90 mL/min   Comment: (NOTE)     The eGFR has been calculated using the CKD EPI equation.     This calculation has not been validated in all clinical situations.     eGFR's persistently <90 mL/min signify possible Chronic Kidney     Disease.  CBC     Status: Abnormal   Collection Time    11/01/13  7:55 AM      Result Value Range   WBC 5.9  4.0 - 10.5 K/uL   RBC 4.30  3.87 - 5.11 MIL/uL   Hemoglobin 10.6 (*) 12.0 - 15.0 g/dL   HCT 09.6 (*) 04.5 - 40.9 %   MCV 79.8  78.0 - 100.0 fL   MCH 24.7 (*) 26.0 - 34.0 pg    MCHC 30.9  30.0 - 36.0 g/dL   RDW 81.1 (*) 91.4 - 78.2 %   Platelets 163  150 - 400 K/uL  TROPONIN I     Status: None   Collection Time    11/01/13 12:16 PM      Result Value Range   Troponin I <0.30  <0.30 ng/mL   Comment:            Due to the release kinetics of cTnI,     a negative result within the first hours     of the onset of symptoms does not rule out     myocardial infarction with certainty.     If myocardial infarction is still suspected,     repeat the test at appropriate intervals.  TSH     Status: None   Collection Time    11/01/13 12:16 PM      Result Value Range   TSH 4.065  0.350 - 4.500 uIU/mL   Comment: Performed at [facility]  GLUCOSE, CAPILLARY     Status: Abnormal   Collection Time    11/01/13 12:29 PM      Result Value Range   Glucose-Capillary 121 (*) 70 - 99 mg/dL   Dg Chest 2 View  95/62/1308   CLINICAL DATA:  Aspiration.  Cough.  EXAM: CHEST  2 VIEW  COMPARISON:  Chest CT 09/20/2013.  Chest x-ray 08/18/2013.  FINDINGS: Mediastinum and hilar structures are stable. Heart size is normal. Pulmonary vascularity is stable. There is a right sided pleural effusion. Atelectasis versus infiltrates noted in the lung bases. Similar findings noted on recent chest CT. Degenerative changes thoracic spine.  IMPRESSION: 1. Right pleural effusion. 2. Bibasilar atelectasis versus infiltrates. Similar findings noted on recent chest CT of 09/20/2013. These findings are new from prior chest x-ray of 08/18/2013 .   Electronically Signed   By: Maisie Fus  Register   On: 10/31/2013 15:42    ROS: General:no colds or fevers, no weight changes Skin:no rashes or ulcers HEENT:no blurred vision, no congestion CV:see HPI PUL:see HPI GI:no diarrhea, occ constipation, no melena, no indigestion GU:no hematuria, no dysuria MS:no joint pain, no claudication Neuro:no syncope, no lightheadedness Endo:+ diabetes, no thyroid disease   Blood pressure 129/64, pulse 71, temperature  97.7 F (36.5 C), temperature source Oral, resp. rate 19, height 4\' 11"  (1.499 m), weight 277 lb 12.5 oz (126 kg), SpO2 100.00%. PE: General:Pleasant affect, NAD, falls to sleep while talking Skin:Warm and dry, brisk capillary refill HEENT:normocephalic, sclera clear,  mucus membranes moist Neck:supple, no JVD, no bruits  Heart:irreg irreg  without murmur, gallup, rub or click Lungs: with rales in bases, + rhonchi, no wheezes currently ZOX:WRUEA, soft, non tender, + BS, do not palpate liver spleen or masses Ext:no lower ext edema, 2+ pedal pulses, 2+ radial pulses Neuro:alert and oriented, MAE, follows commands, + facial symmetry    Assessment/Plan Principal Problem:   Acute CHF (congestive heart failure) Active Problems:   PULMONARY SARCOIDOSIS   Obstructive sleep apnea   Peripheral edema   Respiratory failure with hypoxia   Anemia   Breast cancer, stage 1 s/p rt lumpectomy and radiation 2012   Diabetes mellitus   COPD (chronic obstructive pulmonary disease)   Chronic respiratory failure with hypoxia   Weakness generalized   Breast cancer   Bradycardia   Atrial fibrillation, persistent   Chronic anticoagulation, on Eliquis  PLAN: Cardizem has been stopped, HR improving, may need to restart at lower dose. But for now HR peak in the 70s.  HF clearing on IV lasix though still has a ways to go.  Currently -2470.  Is comfortable sitting up in bed.    MD to see.  [facility] R  Nurse Practitioner Certified [facility] Pager [phone] 11/01/2013, 4:11 PM     Agree with note written by Nada Boozer RNP  Admitted with increased SOB. H/O Sarcoidosis., CAF, brady, and OSA. BNP was moderately elevated. Enz neg. Agree with med adjustment, iv lasix. She is on Eliquis oral AC. Will follow with you.  Runell Gess 11/01/2013 7:36 PM

## 2013-11-01 NOTE — Progress Notes (Signed)
  Echocardiogram 2D Echocardiogram has been performed.  [name] 11/01/2013, 10:30 AM

## 2013-11-01 NOTE — H&P (Signed)
[facility] History and Physical  [name]:[id number] DOB: [birth year] DOA: 10/31/2013  Referring physician: Dr Wilkie Aye PCP: [name], MD   Chief Complaint:  Shortness of breath for several weeks Sent from skilled nursing facility for bradycardia  HPI:  72 year old morbidly obese female with history of COPD with chronic hypoxic respiratory failure on 2 L O2 via nasal cannula, OSA on CPAP, sarcoidosis, history of breast cancer status post lumpectomy and radiation therapy (follows with Dr. [name]), diabetes mellitus, anemia, A. fib on  Eliquis, HL was sent to the hospital from skilled nursing facility as she was found to be bradycardic in the 40s. Patient reports being short of breath on exertion progressively over the last 1 month. She does have baseline 2 pillow orthopnea. She reports dyspnea on minimal exertion but denies PND. She also has increased leg swelling for few  weeks. She went to see her pulmonologist today who increased her Lasix dose to 40 mg by mouth twice a day but had not started taking the medicine. She does have cough with whitish phlegm and wheezing which she reports to be chronic. Denies any fever, chills, headache, blurred vision, chest pain, palpitations, abdominal pain, nausea, vomiting,  bowel or urinary symptoms. Denies any sick contacts. Patient unaware of any weight gain. At baseline patient ambulates with the help of a walker.  Course in the ED The patient was found to be bradycardic in the 40s. EKG done showed A. fib with bradycardia. Her blood pressure was also elevated. Patient's cardiologist Dr. [name] he was consulted by ED physician who recommended stopping her beta blocker and monitoring. [name] subsequently improved to 50s and low 60s. Blood work done showed elevated proBNP. Chest x-ray was done at pulmonologists office which showed a right-sided pleural effusion with bilateral atelectasis versus infiltrates which seemed unchanged from CT scan  of the chest one month back. Patient given 40 mg IV Lasix and try hospitalists called for admission to telemetry.   Review of Systems:  Constitutional: Denies fever, chills, diaphoresis, appetite change,  reports fatigue.  HEENT: Denies photophobia, eye pain, redness, hearing loss, ear pain, congestion, sore throat, rhinorrhea, sneezing, mouth sores, trouble swallowing, neck pain, neck stiffness and tinnitus.   Respiratory: Denies SOB, DOE, cough, orthopnea, wheezing, denies chest tightness,  Cardiovascular: Denies chest pain, palpitations ,  leg swelling+.  Gastrointestinal: Denies nausea, vomiting, abdominal pain, diarrhea, constipation, blood in stool and abdominal distention.  Genitourinary: Denies dysuria, urgency, frequency, hematuria, flank pain and difficulty urinating.  Endocrine: Denies polyuria, polydipsia. Musculoskeletal: Denies myalgias, back pain, joint swelling, arthralgias and gait problem.  Skin: Denies pallor, rash and wound.  Neurological: Denies dizziness, seizures, syncope, weakness, light-headedness, numbness and headaches.  Hematological: Denies adenopathy. Easy bruising,  Psychiatric/Behavioral: Denies  mood changes, confusion, nervousness, sleep disturbance and agitation   Past Medical History  Diagnosis Date  . Diabetes mellitus   . Asthma   . Bronchitis   . Hernia   . Anemia   . Uterine cancer     s/p hysterectomy  . Arthritis   . Hernia   . Morbid obesity   . Sarcoid   . OSA (obstructive sleep apnea)     Uses nasal CPAP Q HS  . Atrial fibrillation   . Breast cancer, stage 1 10/17/2011    s/p lumpectomy and XRT  . Cough   . Wheezing   . Chills   . Constipation   . Bruises easily   . Gout attack     ankle, then  wrist and hands  . Hyperlipidemia    Past Surgical History  Procedure Laterality Date  . Total knee arthroplasty  2001  . Abdominal hysterectomy  2006  . Resection of uterine cancer    . Left lower leg fx--casted    . Breast  lumpectomy Right    Social History:  reports that she has never smoked. She has never used smokeless tobacco. She reports that she does not drink alcohol or use illicit drugs.  Allergies  Allergen Reactions  . Aspirin     Avoids due to being on blood thinners  . Contrast Media [Iodinated Diagnostic Agents] Nausea And Vomiting  . Ibuprofen     Avoids due to being on blood thinners  . Pravachol Other (See Comments)    Muscle pain  . Pravastatin Sodium     Family History  Problem Relation Age of Onset  . Diabetes Mother   . Cancer Father     colon  . Diabetes Brother   . Cancer Sister   . Diabetes Sister     Prior to Admission medications   Medication Sig Start Date End Date Taking? Authorizing Provider  acetaminophen (TYLENOL) 500 MG tablet Take 1,000 mg by mouth every 6 (six) hours as needed. pain   Yes Historical Provider, MD  albuterol (PROVENTIL HFA) 108 (90 BASE) MCG/ACT inhaler Inhale 2 puffs into the lungs every 6 (six) hours as needed for wheezing.   Yes Historical Provider, MD  allopurinol (ZYLOPRIM) 100 MG tablet Take 200 mg by mouth daily.  11/04/12  Yes Historical Provider, MD  apixaban (ELIQUIS) 5 MG TABS tablet Take 1 tablet (5 mg total) by mouth 2 (two) times daily. 07/23/13  Yes Chrystie Nose, MD  Ascorbic Acid (VITAMIN C) 1000 MG tablet Take 1,000 mg by mouth daily.   Yes Historical Provider, MD  budesonide-formoterol (SYMBICORT) 160-4.5 MCG/ACT inhaler Inhale 2 puffs into the lungs 2 (two) times daily.   Yes Historical Provider, MD  chlorpheniramine-HYDROcodone (TUSSIONEX) 10-8 MG/5ML LQCR Take 5 mLs by mouth every 12 (twelve) hours as needed. 08/26/13  Yes [name], MD  colchicine 0.6 MG tablet Take 1 tablet (0.6 mg total) by mouth 2 (two) times daily. 11/22/12  Yes [name], MD  diltiazem (TIAZAC) 360 MG 24 hr capsule Take 360 mg by mouth daily.  06/06/11  Yes Historical Provider, MD  exemestane (AROMASIN) 25 MG tablet Take 1 tablet (25 mg total) by  mouth daily after breakfast. 03/08/13  Yes Victorino December, MD  ferrous fumarate (HEMOCYTE - 106 MG FE) 325 (106 FE) MG TABS Take 1 tablet by mouth 2 (two) times daily.    Yes Historical Provider, MD  fexofenadine (ALLEGRA) 180 MG tablet Take 180 mg by mouth daily.   Yes Historical Provider, MD  furosemide (LASIX) 40 MG tablet Take 1 tablet (40 mg total) by mouth daily. 10/31/13  Yes Waymon Budge, MD  guaiFENesin (MUCINEX) 600 MG 12 hr tablet Take 1,200 mg by mouth 2 (two) times daily.   Yes Historical Provider, MD  HUMULIN N 100 UNIT/ML injection Inject 5 Units into the skin as needed (5 units if blood sugar is greater than 150).  02/05/13  Yes Historical Provider, MD  HYDROcodone-acetaminophen (NORCO/VICODIN) 5-325 MG per tablet Take 1 tablet by mouth every 6 (six) hours as needed. For pain. 10/14/13  Yes Tiffany L Reed, DO  insulin glargine (LANTUS) 100 UNIT/ML injection Inject 0.28 mLs (28 Units total) into the skin at bedtime. 05/14/13  Yes Joseph Art, DO  ipratropium (ATROVENT) 0.02 % nebulizer solution Take 2.5 mLs (0.5 mg total) by nebulization every 6 (six) hours. 11/22/12  Yes [name], MD  levalbuterol (XOPENEX) 0.63 MG/3ML nebulizer solution Take 3 mLs (0.63 mg total) by nebulization every 6 (six) hours. 11/22/12  Yes [name], MD  LORazepam (ATIVAN) 0.5 MG tablet Take one tablet by mouth every night at bedtime as needed for anxiety 09/23/13  Yes Tiffany L Reed, DO  meclizine (ANTIVERT) 25 MG tablet Take 6.25-12.5 mg by mouth 3 (three) times daily as needed. For dizziness.   Yes Historical Provider, MD  metFORMIN (GLUCOPHAGE) 500 MG tablet Take 500 mg by mouth 2 (two) times daily with a meal. If cbg is greater than 120 pt takes medication   Yes Historical Provider, MD  metoprolol tartrate (LOPRESSOR) 25 MG tablet Take 25 mg by mouth 2 (two) times daily.  05/13/11  Yes Historical Provider, MD  Multiple Vitamin (MULTIVITAMIN WITH MINERALS) TABS Take 1 tablet by mouth daily.    Yes Historical Provider, MD  ondansetron (ZOFRAN) 4 MG tablet Take 4 mg by mouth every 8 (eight) hours as needed for nausea.   Yes Historical Provider, MD  polyethylene glycol (MIRALAX / GLYCOLAX) packet Take 17 g by mouth daily. 08/26/13  Yes [name], MD  simvastatin (ZOCOR) 40 MG tablet Take 40 mg by mouth at bedtime.   Yes Historical Provider, MD    Physical Exam:  Filed Vitals:   10/31/13 2230 10/31/13 2300 10/31/13 2330 11/01/13 0000  BP: 122/89 125/52 118/54 116/57  Pulse: 77 59 134 40  Temp:      TempSrc:      Resp: 24 20 23 20   SpO2:        Constitutional: Vital signs reviewed.  Energy or recent female lying in bed in no acute distress HEENT: No pallor, no icterus, moist oral mucosa, JVD present Cardiovascular: S1 and S2 if it will alleviate with, no murmurs rub or gallop Chest: Bilateral scattered rhonchi, no crackles or wheezes Abdominal: Soft. Non-tender, non-distended, bowel sounds are normal, no masses, organomegaly, or guarding present.   extremities :warm, 1+ pitting edema up to upper one third tibia Neurological: A&O x3, nonfocal   Labs on Admission:  Basic Metabolic Panel:  Recent Labs Lab 10/31/13 2210  NA 138  K 3.9  CL 97  CO2 31  GLUCOSE 175*  BUN 23  CREATININE 0.83  CALCIUM 10.0   Liver Function Tests:  Recent Labs Lab 10/31/13 2210  AST 18  ALT 10  ALKPHOS 91  BILITOT 0.6  PROT 7.1  ALBUMIN 3.9   No results found for this basename: LIPASE, AMYLASE,  in the last 168 hours No results found for this basename: AMMONIA,  in the last 168 hours CBC:  Recent Labs Lab 10/31/13 1514 10/31/13 2210  WBC 7.9 7.1  NEUTROABS 5.7 4.7  HGB 11.3* 11.2*  HCT 34.5* 36.0  MCV 76.7* 80.2  PLT 228.0 194   Cardiac Enzymes: No results found for this basename: CKTOTAL, CKMB, CKMBINDEX, TROPONINI,  in the last 168 hours BNP: No components found with this basename: POCBNP,  CBG: No results found for this basename: GLUCAP,  in the last 168  hours  Radiological Exams on Admission: Dg Chest 2 View  10/31/2013   CLINICAL DATA:  Aspiration.  Cough.  EXAM: CHEST  2 VIEW  COMPARISON:  Chest CT 09/20/2013.  Chest x-ray 08/18/2013.  FINDINGS: Mediastinum and hilar structures are  stable. Heart size is normal. Pulmonary vascularity is stable. There is a right sided pleural effusion. Atelectasis versus infiltrates noted in the lung bases. Similar findings noted on recent chest CT. Degenerative changes thoracic spine.  IMPRESSION: 1. Right pleural effusion. 2. Bibasilar atelectasis versus infiltrates. Similar findings noted on recent chest CT of 09/20/2013. These findings are new from prior chest x-ray of 08/18/2013 .   Electronically Signed   By: Maisie Fus  Register   On: 10/31/2013 15:42    EKG: afib @40 , NO st- T changes  Assessment/Plan Principal Problem:   Acute CHF (congestive heart failure) Admit to telemetry Diurese with IV lasix 40 mg twice daily. Monitor strict I/O. and daily weight. Last 2-D echo done 6 months back showed normal EF. I will repeat another 2-D echo. -Start her on lisinopril 20 mg daily.  Active Problems: A. fib with bradycardia Monitor on telemetry. Asymptomatic. Heart rate improved to 50s and 60s. Hold beta blocker and Cardizem. Patient's cardiologist ( Dr Rennis Golden, [facility] ) informed and will consult in the morning. Continue Eliquis.  Pulmonary sarcoidosis Follows with outpatient pulmonary. Not on steroids at present    Obstructive sleep apnea Continue nightime CPAP     Respiratory failure with hypoxia chronic with COPD. continue o2 via [place]. continue nebs and inhalers ( albuterol/ symbicort) -No signs of exacerbation    Anemia Stable. Continue iron supplements    Breast cancer, stage 1 s/p rt lumpectomy and radiation 2012 continue aromasin. follows with Dr [name]    Diabetes mellitus Continue home dose of Lantus. Sliding scale insulin. Hold metformin check A1c    Weakness, generalized Will obtain  PT   DVT prophylaxis: On Eliquis  Diet: diabetic  Code Status: full code Family Communication: Son Financial trader at bedside Disposition Plan: currently inpatient  Eddie North [facility] Pager [phone]  If 7PM-7AM, please contact night-coverage [email or web address] Password [facility] 11/01/2013, 12:47 AM    Total time spent admission: 70 minutes

## 2013-11-01 NOTE — Progress Notes (Signed)
I have seen and assessed patient and I agree with Dr. Valora Piccolo assessment and plan. Patient presented an acute CHF exacerbation. Repeat 2-D echo has been ordered. We'll cycle cardiac enzymes every 6 hours x3. Check a TSH. Continue IV diuretics. Cardiology consultation pending.

## 2013-11-01 NOTE — Care Management Note (Addendum)
    Page 1 of 1   11/05/2013     12:09:47 PM   CARE MANAGEMENT NOTE 11/05/2013  Patient:  [name], [name]   Account Number:  [id number]  Date Initiated:  11/01/2013  Documentation initiated by:  Lanier Clam  Subjective/Objective Assessment:   72 Y/O F ADMITTED W/CHF.     Action/Plan:   FROM SNF[facility].   Anticipated DC Date:  11/06/2013   Anticipated DC Plan:  SKILLED NURSING FACILITY      DC Planning Services  CM consult      Choice offered to / List presented to:             Status of service:  In process, will continue to follow Medicare Important Message given?   (If response is "NO", the following Medicare IM given date fields will be blank) Date Medicare IM given:   Date Additional Medicare IM given:    Discharge Disposition:    Per UR Regulation:  Reviewed for med. necessity/level of care/duration of stay  If discussed at Long Length of Stay Meetings, dates discussed:   11/05/2013    Comments:  11/05/13 Vernita Tague RN,BSN NCM [phone] IV LASIX,CARDIO FOLLOWING.SPOKE TO PATIENT ABOUT D/C PLANS, AFTER EXPLAINING DIFFERENCES W/HHC(INTERMITTENT),& FAMILY NOT @ HOME  VS SNF,PATIENT AGREES TO SNF.  11/04/13 Matti Minney RN,BSN NCM [phone] CHF-IV LASIX,NEBS,FLUID RESTRICTION,02,CARDIO FOLLOWING.D/C PLAN RETURN SNF.  11/01/13 Shron Ozer RN,BSN NCM [phone]

## 2013-11-02 LAB — BASIC METABOLIC PANEL
CO2: 38 mEq/L — ABNORMAL HIGH (ref 19–32)
Chloride: 96 mEq/L (ref 96–112)
Creatinine, Ser: 0.95 mg/dL (ref 0.50–1.10)
Glucose, Bld: 83 mg/dL (ref 70–99)
Potassium: 3.7 mEq/L (ref 3.5–5.1)
Sodium: 141 mEq/L (ref 135–145)

## 2013-11-02 LAB — CBC
HCT: 37 % (ref 36.0–46.0)
Hemoglobin: 11.4 g/dL — ABNORMAL LOW (ref 12.0–15.0)
MCH: 24.9 pg — ABNORMAL LOW (ref 26.0–34.0)
MCHC: 30.8 g/dL (ref 30.0–36.0)
MCV: 81 fL (ref 78.0–100.0)
Platelets: 171 10*3/uL (ref 150–400)
RBC: 4.57 MIL/uL (ref 3.87–5.11)
RDW: 19.1 % — ABNORMAL HIGH (ref 11.5–15.5)
WBC: 6.6 10*3/uL (ref 4.0–10.5)

## 2013-11-02 LAB — GLUCOSE, CAPILLARY: Glucose-Capillary: 160 mg/dL — ABNORMAL HIGH (ref 70–99)

## 2013-11-02 NOTE — Progress Notes (Signed)
Subjective:  Feeling much better, less SOB  Objective:  Temp:  [97.4 F (36.3 C)-98.4 F (36.9 C)] 98.4 F (36.9 C) (11/22 1335) Pulse Rate:  [87-98] 87 (11/22 1335) Resp:  [18-20] 18 (11/22 1335) BP: (122-140)/(60-77) 130/66 mmHg (11/22 1335) SpO2:  [93 %-99 %] 95 % (11/22 1335) Weight:  [259 lb 11.2 oz (117.8 kg)] 259 lb 11.2 oz (117.8 kg) (11/22 0314) Weight change: -18 lb 1.2 oz (-8.2 kg)  Intake/Output from previous day: 11/21 0701 - 11/22 0700 In: 600 [P.O.:600] Out: 3775 [Urine:3775]  Intake/Output from this shift: Total I/O In: 600 [P.O.:600] Out: 2900 [Urine:2900]  Physical Exam: General appearance: alert and no distress Neck: no adenopathy, no carotid bruit, no JVD, supple, symmetrical, trachea midline and thyroid not enlarged, symmetric, no tenderness/mass/nodules Lungs: clear to auscultation bilaterally Heart: irregularly irregular rhythm Extremities: extremities normal, atraumatic, no cyanosis or edema  Lab Results: Results for orders placed during the hospital encounter of 10/31/13 (from the past 48 hour(s))  CBC WITH DIFFERENTIAL     Status: Abnormal   Collection Time    10/31/13 10:10 PM      Result Value Range   WBC 7.1  4.0 - 10.5 K/uL   RBC 4.49  3.87 - 5.11 MIL/uL   Hemoglobin 11.2 (*) 12.0 - 15.0 g/dL   HCT 16.1  09.6 - 04.5 %   MCV 80.2  78.0 - 100.0 fL   MCH 24.9 (*) 26.0 - 34.0 pg   MCHC 31.1  30.0 - 36.0 g/dL   RDW 40.9 (*) 81.1 - 91.4 %   Platelets 194  150 - 400 K/uL   Neutrophils Relative % 66  43 - 77 %   Neutro Abs 4.7  1.7 - 7.7 K/uL   Lymphocytes Relative 23  12 - 46 %   Lymphs Abs 1.6  0.7 - 4.0 K/uL   Monocytes Relative 8  3 - 12 %   Monocytes Absolute 0.6  0.1 - 1.0 K/uL   Eosinophils Relative 3  0 - 5 %   Eosinophils Absolute 0.2  0.0 - 0.7 K/uL   Basophils Relative 0  0 - 1 %   Basophils Absolute 0.0  0.0 - 0.1 K/uL  COMPREHENSIVE METABOLIC PANEL     Status: Abnormal   Collection Time    10/31/13 10:10 PM   Result Value Range   Sodium 138  135 - 145 mEq/L   Potassium 3.9  3.5 - 5.1 mEq/L   Chloride 97  96 - 112 mEq/L   CO2 31  19 - 32 mEq/L   Glucose, Bld 175 (*) 70 - 99 mg/dL   BUN 23  6 - 23 mg/dL   Creatinine, Ser 7.82  0.50 - 1.10 mg/dL   Calcium 95.6  8.4 - 21.3 mg/dL   Total Protein 7.1  6.0 - 8.3 g/dL   Albumin 3.9  3.5 - 5.2 g/dL   AST 18  0 - 37 U/L   ALT 10  0 - 35 U/L   Alkaline Phosphatase 91  39 - 117 U/L   Total Bilirubin 0.6  0.3 - 1.2 mg/dL   GFR calc non Af Amer 68 (*) >90 mL/min   GFR calc Af Amer 79 (*) >90 mL/min   Comment: (NOTE)     The eGFR has been calculated using the CKD EPI equation.     This calculation has not been validated in all clinical situations.     eGFR's persistently <90 mL/min signify  possible Chronic Kidney     Disease.  PRO B NATRIURETIC PEPTIDE     Status: Abnormal   Collection Time    10/31/13 10:10 PM      Result Value Range   Pro B Natriuretic peptide (BNP) 2189.0 (*) 0 - 125 pg/mL  POCT I-STAT TROPONIN I     Status: None   Collection Time    10/31/13 10:17 PM      Result Value Range   Troponin i, poc 0.00  0.00 - 0.08 ng/mL   Comment 3            Comment: Due to the release kinetics of cTnI,     a negative result within the first hours     of the onset of symptoms does not rule out     myocardial infarction with certainty.     If myocardial infarction is still suspected,     repeat the test at appropriate intervals.  URINALYSIS, ROUTINE W REFLEX MICROSCOPIC     Status: None   Collection Time    10/31/13 11:27 PM      Result Value Range   Color, Urine YELLOW  YELLOW   APPearance CLEAR  CLEAR   Specific Gravity, Urine 1.022  1.005 - 1.030   pH 5.5  5.0 - 8.0   Glucose, UA NEGATIVE  NEGATIVE mg/dL   Hgb urine dipstick NEGATIVE  NEGATIVE   Bilirubin Urine NEGATIVE  NEGATIVE   Ketones, ur NEGATIVE  NEGATIVE mg/dL   Protein, ur NEGATIVE  NEGATIVE mg/dL   Urobilinogen, UA 0.2  0.0 - 1.0 mg/dL   Nitrite NEGATIVE  NEGATIVE    Leukocytes, UA NEGATIVE  NEGATIVE   Comment: MICROSCOPIC NOT DONE ON URINES WITH NEGATIVE PROTEIN, BLOOD, LEUKOCYTES, NITRITE, OR GLUCOSE <1000 mg/dL.  GLUCOSE, CAPILLARY     Status: Abnormal   Collection Time    11/01/13  1:06 AM      Result Value Range   Glucose-Capillary 135 (*) 70 - 99 mg/dL   Comment 1 Documented in Chart     Comment 2 Notify RN    MRSA PCR SCREENING     Status: None   Collection Time    11/01/13  3:35 AM      Result Value Range   MRSA by PCR NEGATIVE  NEGATIVE   Comment:            The GeneXpert MRSA Assay (FDA     approved for NASAL specimens     only), is one component of a     comprehensive MRSA colonization     surveillance program. It is not     intended to diagnose MRSA     infection nor to guide or     monitor treatment for     MRSA infections.  GLUCOSE, CAPILLARY     Status: Abnormal   Collection Time    11/01/13  7:21 AM      Result Value Range   Glucose-Capillary 114 (*) 70 - 99 mg/dL  BASIC METABOLIC PANEL     Status: Abnormal   Collection Time    11/01/13  7:55 AM      Result Value Range   Sodium 139  135 - 145 mEq/L   Potassium 3.4 (*) 3.5 - 5.1 mEq/L   Chloride 99  96 - 112 mEq/L   CO2 33 (*) 19 - 32 mEq/L   Glucose, Bld 130 (*) 70 - 99 mg/dL   BUN 21  6 -  23 mg/dL   Creatinine, Ser 1.61  0.50 - 1.10 mg/dL   Calcium 9.7  8.4 - 09.6 mg/dL   GFR calc non Af Amer 81 (*) >90 mL/min   GFR calc Af Amer >90  >90 mL/min   Comment: (NOTE)     The eGFR has been calculated using the CKD EPI equation.     This calculation has not been validated in all clinical situations.     eGFR's persistently <90 mL/min signify possible Chronic Kidney     Disease.  CBC     Status: Abnormal   Collection Time    11/01/13  7:55 AM      Result Value Range   WBC 5.9  4.0 - 10.5 K/uL   RBC 4.30  3.87 - 5.11 MIL/uL   Hemoglobin 10.6 (*) 12.0 - 15.0 g/dL   HCT 04.5 (*) 40.9 - 81.1 %   MCV 79.8  78.0 - 100.0 fL   MCH 24.7 (*) 26.0 - 34.0 pg   MCHC 30.9   30.0 - 36.0 g/dL   RDW 91.4 (*) 78.2 - 95.6 %   Platelets 163  150 - 400 K/uL  TROPONIN I     Status: None   Collection Time    11/01/13 12:16 PM      Result Value Range   Troponin I <0.30  <0.30 ng/mL   Comment:            Due to the release kinetics of cTnI,     a negative result within the first hours     of the onset of symptoms does not rule out     myocardial infarction with certainty.     If myocardial infarction is still suspected,     repeat the test at appropriate intervals.  TSH     Status: None   Collection Time    11/01/13 12:16 PM      Result Value Range   TSH 4.065  0.350 - 4.500 uIU/mL   Comment: Performed at [facility]  GLUCOSE, CAPILLARY     Status: Abnormal   Collection Time    11/01/13 12:29 PM      Result Value Range   Glucose-Capillary 121 (*) 70 - 99 mg/dL  TROPONIN I     Status: None   Collection Time    11/01/13  4:26 PM      Result Value Range   Troponin I <0.30  <0.30 ng/mL   Comment:            Due to the release kinetics of cTnI,     a negative result within the first hours     of the onset of symptoms does not rule out     myocardial infarction with certainty.     If myocardial infarction is still suspected,     repeat the test at appropriate intervals.  GLUCOSE, CAPILLARY     Status: None   Collection Time    11/01/13  4:36 PM      Result Value Range   Glucose-Capillary 95  70 - 99 mg/dL  GLUCOSE, CAPILLARY     Status: Abnormal   Collection Time    11/01/13  9:34 PM      Result Value Range   Glucose-Capillary 156 (*) 70 - 99 mg/dL   Comment 1 Documented in Chart     Comment 2 Notify RN    TROPONIN I     Status: None   Collection Time  11/01/13 10:55 PM      Result Value Range   Troponin I <0.30  <0.30 ng/mL   Comment:            Due to the release kinetics of cTnI,     a negative result within the first hours     of the onset of symptoms does not rule out     myocardial infarction with certainty.     If myocardial  infarction is still suspected,     repeat the test at appropriate intervals.  BASIC METABOLIC PANEL     Status: Abnormal   Collection Time    11/02/13  4:40 AM      Result Value Range   Sodium 141  135 - 145 mEq/L   Potassium 3.7  3.5 - 5.1 mEq/L   Chloride 96  96 - 112 mEq/L   CO2 38 (*) 19 - 32 mEq/L   Glucose, Bld 83  70 - 99 mg/dL   BUN 20  6 - 23 mg/dL   Creatinine, Ser 1.61  0.50 - 1.10 mg/dL   Calcium 9.9  8.4 - 09.6 mg/dL   GFR calc non Af Amer 58 (*) >90 mL/min   GFR calc Af Amer 67 (*) >90 mL/min   Comment: (NOTE)     The eGFR has been calculated using the CKD EPI equation.     This calculation has not been validated in all clinical situations.     eGFR's persistently <90 mL/min signify possible Chronic Kidney     Disease.  CBC     Status: Abnormal   Collection Time    11/02/13  4:40 AM      Result Value Range   WBC 6.6  4.0 - 10.5 K/uL   RBC 4.57  3.87 - 5.11 MIL/uL   Hemoglobin 11.4 (*) 12.0 - 15.0 g/dL   HCT 04.5  40.9 - 81.1 %   MCV 81.0  78.0 - 100.0 fL   MCH 24.9 (*) 26.0 - 34.0 pg   MCHC 30.8  30.0 - 36.0 g/dL   RDW 91.4 (*) 78.2 - 95.6 %   Platelets 171  150 - 400 K/uL  GLUCOSE, CAPILLARY     Status: None   Collection Time    11/02/13  8:01 AM      Result Value Range   Glucose-Capillary 82  70 - 99 mg/dL  GLUCOSE, CAPILLARY     Status: Abnormal   Collection Time    11/02/13 12:15 PM      Result Value Range   Glucose-Capillary 120 (*) 70 - 99 mg/dL    Imaging: Imaging results have been reviewed  Assessment/Plan:   1. Principal Problem: 2.   Acute CHF (congestive heart failure) 3. Active Problems: 4.   PULMONARY SARCOIDOSIS 5.   Obstructive sleep apnea 6.   Peripheral edema 7.   Respiratory failure with hypoxia 8.   Anemia 9.   Breast cancer, stage 1 s/p rt lumpectomy and radiation 2012 10.   Diabetes mellitus 11.   COPD (chronic obstructive pulmonary disease) 12.   Chronic respiratory failure with hypoxia 13.   Weakness generalized 14.    Breast cancer 15.   Bradycardia 16.   Atrial fibrillation, persistent 17.   Chronic anticoagulation, on Eliquis 18.   Time Spent Directly with Patient:  20 minutes  Length of Stay:  LOS: 2 days   Feeling much better. Excellent diuresis with IV lasix (-6L). HR also better with holding neg chronotropes.  EF nl by 2D. Continue current Rx. Prob ready for D/C by Monday or Tuesday back to [facility] on higher dose of PO lasix.  Runell Gess 11/02/2013, 3:40 PM

## 2013-11-02 NOTE — Progress Notes (Signed)
[facility] PROGRESS NOTE  [name] EAV:[id number] DOB: 06-08-1941 DOA: 10/31/2013 PCP: [name], MD  Assessment/Plan: #1 acute CHF exacerbation Clinical improvement. Questionable etiology. I./oh equal -4.005 L. cardiac enzymes negative x3. TSH within normal limits of 4.065. 2-D echo with EF of 55-60%. No wall motion abnormalities. Current weight is 259 pounds 011 7.8 kg. Continue IV Lasix, lisinopril, Zocor. Cardiology following and appreciate input and recommendations.  #2 bradycardia Improved off beta blockers. Cardiac enzymes negative x3. 2-D echo with no valvular abnormalities. Cardiology following.  #3 anemia H&H stable. Continue iron supplements.  #4 stage I breast cancer status post right lumpectomy radiation 2012. Continue Aromasin. Oncology has been notified.  #5 diabetes mellitus Continue sliding scale insulin. Continue to hold oral hypoglycemics.  #6 breast cancer status post right lumpectomy radiation 2012 On Aromasin. Oncology has been notified.  #7 atrial fibrillation Currently rate controlled. Beta blocker was stopped secondary to bradycardia. On oral anticoagulation. Per cardiology.  #8 history of sarcoidosis Stable.  #9 obstructive sleep apnea CPAP each bedtime.  #10 chronic respiratory failure Patient with chronic COPD on home O2. Continue nebs and inhalers. Follow.  Code Status: Full Family Communication: Updated patient no family at bedside. Disposition Plan: Home versus SNF when medically stable.   Consultants:  Cardiology: Dr. Allyson Sabal 11/01/2013  Procedures:  2-D echo 11/01/2013  Chest x-ray 10/31/2013  Antibiotics:  None  HPI/Subjective: Patient states SOB improved.  Objective: Filed Vitals:   11/02/13 0708  BP: 122/77  Pulse: 94  Temp: 97.4 F (36.3 C)  Resp: 20    Intake/Output Summary (Last 24 hours) at 11/02/13 1054 Last data filed at 11/02/13 0900  Gross per 24 hour  Intake    720 ml  Output   4125  ml  Net  -3405 ml   Filed Weights   11/01/13 0100 11/01/13 0104 11/02/13 0314  Weight: 126 kg (277 lb 12.5 oz) 126 kg (277 lb 12.5 oz) 117.8 kg (259 lb 11.2 oz)    Exam:   General:  NAD  Cardiovascular: RRR  Respiratory: Bibasilar crackles.  Abdomen: soft/NT/ND/+BS  Musculoskeletal: No c/c. Trace LE edema   Data Reviewed: Basic Metabolic Panel:  Recent Labs Lab 10/31/13 2210 11/01/13 0755 11/02/13 0440  NA 138 139 141  K 3.9 3.4* 3.7  CL 97 99 96  CO2 31 33* 38*  GLUCOSE 175* 130* 83  BUN 23 21 20   CREATININE 0.83 0.76 0.95  CALCIUM 10.0 9.7 9.9   Liver Function Tests:  Recent Labs Lab 10/31/13 2210  AST 18  ALT 10  ALKPHOS 91  BILITOT 0.6  PROT 7.1  ALBUMIN 3.9   No results found for this basename: LIPASE, AMYLASE,  in the last 168 hours No results found for this basename: AMMONIA,  in the last 168 hours CBC:  Recent Labs Lab 10/31/13 1514 10/31/13 2210 11/01/13 0755 11/02/13 0440  WBC 7.9 7.1 5.9 6.6  NEUTROABS 5.7 4.7  --   --   HGB 11.3* 11.2* 10.6* 11.4*  HCT 34.5* 36.0 34.3* 37.0  MCV 76.7* 80.2 79.8 81.0  PLT 228.0 194 163 171   Cardiac Enzymes:  Recent Labs Lab 11/01/13 1216 11/01/13 1626 11/01/13 2255  TROPONINI <0.30 <0.30 <0.30   BNP (last 3 results)  Recent Labs  05/11/13 0450 10/31/13 1514 10/31/13 2210  PROBNP 1374.0* 305.0* 2189.0*   CBG:  Recent Labs Lab 11/01/13 0721 11/01/13 1229 11/01/13 1636 11/01/13 2134 11/02/13 0801  GLUCAP 114* 121* 95 156* 82  Recent Results (from the past 240 hour(s))  MRSA PCR SCREENING     Status: None   Collection Time    11/01/13  3:35 AM      Result Value Range Status   MRSA by PCR NEGATIVE  NEGATIVE Final   Comment:            The GeneXpert MRSA Assay (FDA     approved for NASAL specimens     only), is one component of a     comprehensive MRSA colonization     surveillance program. It is not     intended to diagnose MRSA     infection nor to guide or      monitor treatment for     MRSA infections.     Studies: Dg Chest 2 View  10/31/2013   CLINICAL DATA:  Aspiration.  Cough.  EXAM: CHEST  2 VIEW  COMPARISON:  Chest CT 09/20/2013.  Chest x-ray 08/18/2013.  FINDINGS: Mediastinum and hilar structures are stable. Heart size is normal. Pulmonary vascularity is stable. There is a right sided pleural effusion. Atelectasis versus infiltrates noted in the lung bases. Similar findings noted on recent chest CT. Degenerative changes thoracic spine.  IMPRESSION: 1. Right pleural effusion. 2. Bibasilar atelectasis versus infiltrates. Similar findings noted on recent chest CT of 09/20/2013. These findings are new from prior chest x-ray of 08/18/2013 .   Electronically Signed   By: Maisie Fus  Register   On: 10/31/2013 15:42    Scheduled Meds: . allopurinol  200 mg Oral Daily  . apixaban  5 mg Oral BID  . budesonide-formoterol  2 puff Inhalation BID  . colchicine  0.6 mg Oral BID  . exemestane  25 mg Oral QPC breakfast  . ferrous fumarate  1 tablet Oral BID  . furosemide  40 mg Intravenous Q12H  . guaiFENesin  1,200 mg Oral BID  . insulin aspart  0-15 Units Subcutaneous TID WC  . insulin glargine  28 Units Subcutaneous QHS  . ipratropium  0.5 mg Nebulization Q6H WA  . levalbuterol  0.63 mg Nebulization Q6H WA  . lisinopril  20 mg Oral Daily  . loratadine  10 mg Oral Daily  . LORazepam  0.5 mg Oral QHS  . multivitamin with minerals  1 tablet Oral Daily  . polyethylene glycol  17 g Oral Daily  . potassium chloride  20 mEq Oral Daily  . simvastatin  40 mg Oral QHS  . sodium chloride  3 mL Intravenous Q12H  . vitamin C  1,000 mg Oral Daily   Continuous Infusions:   Principal Problem:   Acute CHF (congestive heart failure) Active Problems:   PULMONARY SARCOIDOSIS   Obstructive sleep apnea   Peripheral edema   Respiratory failure with hypoxia   Anemia   Breast cancer, stage 1 s/p rt lumpectomy and radiation 2012   Diabetes mellitus   COPD (chronic  obstructive pulmonary disease)   Chronic respiratory failure with hypoxia   Weakness generalized   Breast cancer   Bradycardia   Atrial fibrillation, persistent   Chronic anticoagulation, on Eliquis    Time spent: 30 mins    [facility]  [facility] Pager [phone]. If 7PM-7AM, please contact night-coverage at [email or web address], password [facility] 11/02/2013, 10:54 AM  LOS: 2 days

## 2013-11-02 NOTE — Evaluation (Signed)
Physical Therapy Evaluation Patient Details Name: [name] MRN: [id number] DOB: 26-Jan-1941 Today's Date: 11/02/2013 Time: 4098-1191 PT Time Calculation (min): 25 min  PT Assessment / Plan / Recommendation History of Present Illness  72 year old female with history of COPD with chronic hypoxic respiratory failure on 2 L O2 via nasal cannula, OSA on CPAP, sarcoidosis, history of breast cancer status post lumpectomy and radiation therapy (follows with Dr. [name]), diabetes mellitus, anemia, persistent/permanent A. fib on Eliquis.  She was sent to the hospital from skilled nursing facility as she was found to be bradycardic in the 40s. Patient reports being short of breath on exertion progressively over the last 1 month. She does have baseline 2 pillow orthopnea. She reports dyspnea on minimal exertion but denies PND. She also has increased leg swelling for few weeks  Clinical Impression  Pt will benefit from PT to address deficits below;    PT Assessment  Patient needs continued PT services    Follow Up Recommendations  SNF    Does the patient have the potential to tolerate intense rehabilitation      Barriers to Discharge        Equipment Recommendations  None recommended by PT    Recommendations for Other Services     Frequency Min 3X/week    Precautions / Restrictions Precautions Precautions: Fall   Pertinent Vitals/Pain Pt denies pain;      Mobility  Transfers Transfers: Sit to Stand;Stand to Sit Sit to Stand: 4: Min assist Stand to Sit: 4: Min assist Details for Transfer Assistance: cues for self  assist, HOB at 45 degrees  Ambulation/Gait Ambulation/Gait Assistance: 4: Min assist Ambulation Distance (Feet): 36 Feet Assistive device: Rolling walker Ambulation/Gait Assistance Details: pt requires cues for RW safety and breathing;  Gait Pattern: Step-through pattern;Decreased stride length    Exercises     PT Diagnosis: Difficulty walking  PT Problem List:  Decreased activity tolerance;Decreased balance;Decreased mobility PT Treatment Interventions: DME instruction;Gait training;Functional mobility training;Therapeutic activities;Therapeutic exercise     PT Goals(Current goals can be found in the care plan section) Acute Rehab PT Goals Patient Stated Goal: to go back to rehab PT Goal Formulation: With patient Time For Goal Achievement: 11/09/13 Potential to Achieve Goals: Good  Visit Information  Last PT Received On: 11/02/13 History of Present Illness: 72 year old female with history of COPD with chronic hypoxic respiratory failure on 2 L O2 via nasal cannula, OSA on CPAP, sarcoidosis, history of breast cancer status post lumpectomy and radiation therapy (follows with Dr. [name]), diabetes mellitus, anemia, persistent/permanent A. fib on Eliquis.  She was sent to the hospital from skilled nursing facility as she was found to be bradycardic in the 40s. Patient reports being short of breath on exertion progressively over the last 1 month. She does have baseline 2 pillow orthopnea. She reports dyspnea on minimal exertion but denies PND. She also has increased leg swelling for few weeks       Prior Functioning  Home Living Family/patient expects to be discharged to:: Skilled nursing facility Additional Comments: pt was at [facility] for 2 mos priro to adm Prior Function Level of Independence: Needs assistance Comments: chronic 2L O2; uses 4 wheel RW at all times Communication Communication: No difficulties    Cognition  Cognition Arousal/Alertness: Awake/alert Behavior During Therapy: WFL for tasks assessed/performed Overall Cognitive Status: Within Functional Limits for tasks assessed    Extremity/Trunk Assessment Upper Extremity Assessment Upper Extremity Assessment: Generalized weakness Lower Extremity Assessment Lower  Extremity Assessment: Generalized weakness   Balance    End of Session PT - End of Session Equipment Utilized During  Treatment: Gait belt Activity Tolerance: Patient tolerated treatment well Patient left: with call bell/phone within reach;in bed Nurse Communication: Mobility status  GP     [facility] 11/02/2013, 1:51 PM

## 2013-11-02 NOTE — Progress Notes (Signed)
OT Cancellation Note  Patient Details Name: [name] MRN: [id number] DOB: Nov 24, 1941   Cancelled Treatment:    Noted pt from [facility] and plans to return there. Will defer her OT to SNF  [facility], [place] 11/02/2013, 1:15 PM

## 2013-11-03 LAB — BASIC METABOLIC PANEL
BUN: 24 mg/dL — ABNORMAL HIGH (ref 6–23)
CO2: 38 mEq/L — ABNORMAL HIGH (ref 19–32)
Calcium: 10.3 mg/dL (ref 8.4–10.5)
Chloride: 95 mEq/L — ABNORMAL LOW (ref 96–112)
GFR calc non Af Amer: 64 mL/min — ABNORMAL LOW (ref >90)
Glucose, Bld: 88 mg/dL (ref 70–99)
Potassium: 3.9 mEq/L (ref 3.5–5.1)
Sodium: 141 mEq/L (ref 135–145)

## 2013-11-03 LAB — GLUCOSE, CAPILLARY
Glucose-Capillary: 83 mg/dL (ref 70–99)
Glucose-Capillary: 132 mg/dL — ABNORMAL HIGH (ref 70–99)
Glucose-Capillary: 179 mg/dL — ABNORMAL HIGH (ref 70–99)

## 2013-11-03 NOTE — Progress Notes (Signed)
Pt placed on nasal CPAP set at 9 CMH2O per Pt home settings with 3 LPM O2 bleed in.  Pt tolerating well at this time, RT to monitor and assess as needed.

## 2013-11-03 NOTE — Progress Notes (Signed)
Last night, patient's heart rate went up into the 140's and once into the 170's nonsustained while up to the [facility]. Patient still SOB and weak going from bed to [facility]. No chest pain or dizziness. The heart rate slows down back into the 80's-90's when she lays back in the bed. Will report this off to day shift.

## 2013-11-03 NOTE — Progress Notes (Signed)
[facility] PROGRESS NOTE  [name] ZOX:[id number] DOB: 04/22/1941 DOA: 10/31/2013 PCP: [name], MD  Assessment/Plan: #1 acute CHF exacerbation Clinical improvement. Questionable etiology. I./o equal -5.5 L. cardiac enzymes negative x3. TSH within normal limits of 4.065. 2-D echo with EF of 55-60%. No wall motion abnormalities. Current weight is 248lbs ( 112.7kg)  from 259 pounds 117.8 kg. Continue IV Lasix, lisinopril, Zocor. Cardiology following and appreciate input and recommendations.  #2 bradycardia Improved off beta blockers. Cardiac enzymes negative x3. 2-D echo with no valvular abnormalities. HR now in 80-90s. ?? Resume diltiazem, will defer to cardiology. Cardiology following.  #3 anemia H&H stable. Continue iron supplements.  #4 stage I breast cancer status post right lumpectomy radiation 2012. Continue Aromasin. Oncology has been notified.  #5 diabetes mellitus Continue sliding scale insulin. Continue to hold oral hypoglycemics.  #6 breast cancer status post right lumpectomy radiation 2012 On Aromasin. Oncology has been notified.  #7 atrial fibrillation Currently rate controlled. Beta blocker was stopped secondary to bradycardia. HR in 90s. ?? Resume diltiazem. Will defer to cardiology. On oral anticoagulation. Per cardiology.  #8 history of sarcoidosis Stable.  #9 obstructive sleep apnea CPAP each bedtime.  #10 chronic respiratory failure Patient with chronic COPD on home O2. Continue nebs and inhalers. Follow.  Code Status: Full Family Communication: Updated patient no family at bedside. Disposition Plan: Home versus SNF when medically stable.   Consultants:  Cardiology: Dr. Allyson Sabal 11/01/2013  Procedures:  2-D echo 11/01/2013  Chest x-ray 10/31/2013  Antibiotics:  None  HPI/Subjective: Patient states SOB improved.  Objective: Filed Vitals:   11/03/13 0446  BP: 141/81  Pulse: 94  Temp: 97.9 F (36.6 C)  Resp: 18     Intake/Output Summary (Last 24 hours) at 11/03/13 1112 Last data filed at 11/03/13 0636  Gross per 24 hour  Intake    840 ml  Output   4451 ml  Net  -3611 ml   Filed Weights   11/01/13 0104 11/02/13 0314 11/03/13 0446  Weight: 126 kg (277 lb 12.5 oz) 117.8 kg (259 lb 11.2 oz) 112.7 kg (248 lb 7.3 oz)    Exam:   General:  NAD  Cardiovascular: RRR  Respiratory: Bibasilar crackles.  Abdomen: soft/NT/ND/+BS  Musculoskeletal: No c/c. Trace LE edema   Data Reviewed: Basic Metabolic Panel:  Recent Labs Lab 10/31/13 2210 11/01/13 0755 11/02/13 0440 11/03/13 0500  NA 138 139 141 141  K 3.9 3.4* 3.7 3.9  CL 97 99 96 95*  CO2 31 33* 38* 38*  GLUCOSE 175* 130* 83 88  BUN 23 21 20  24*  CREATININE 0.83 0.76 0.95 0.87  CALCIUM 10.0 9.7 9.9 10.3   Liver Function Tests:  Recent Labs Lab 10/31/13 2210  AST 18  ALT 10  ALKPHOS 91  BILITOT 0.6  PROT 7.1  ALBUMIN 3.9   No results found for this basename: LIPASE, AMYLASE,  in the last 168 hours No results found for this basename: AMMONIA,  in the last 168 hours CBC:  Recent Labs Lab 10/31/13 1514 10/31/13 2210 11/01/13 0755 11/02/13 0440  WBC 7.9 7.1 5.9 6.6  NEUTROABS 5.7 4.7  --   --   HGB 11.3* 11.2* 10.6* 11.4*  HCT 34.5* 36.0 34.3* 37.0  MCV 76.7* 80.2 79.8 81.0  PLT 228.0 194 163 171   Cardiac Enzymes:  Recent Labs Lab 11/01/13 1216 11/01/13 1626 11/01/13 2255  TROPONINI <0.30 <0.30 <0.30   BNP (last 3 results)  Recent Labs  05/11/13 0450 10/31/13  1514 10/31/13 2210  PROBNP 1374.0* 305.0* 2189.0*   CBG:  Recent Labs Lab 11/02/13 0801 11/02/13 1215 11/02/13 1704 11/02/13 2123 11/03/13 0723  GLUCAP 82 120* 124* 160* 83    Recent Results (from the past 240 hour(s))  MRSA PCR SCREENING     Status: None   Collection Time    11/01/13  3:35 AM      Result Value Range Status   MRSA by PCR NEGATIVE  NEGATIVE Final   Comment:            The GeneXpert MRSA Assay (FDA      approved for NASAL specimens     only), is one component of a     comprehensive MRSA colonization     surveillance program. It is not     intended to diagnose MRSA     infection nor to guide or     monitor treatment for     MRSA infections.     Studies: No results found.  Scheduled Meds: . allopurinol  200 mg Oral Daily  . apixaban  5 mg Oral BID  . budesonide-formoterol  2 puff Inhalation BID  . colchicine  0.6 mg Oral BID  . exemestane  25 mg Oral QPC breakfast  . ferrous fumarate  1 tablet Oral BID  . furosemide  40 mg Intravenous Q12H  . guaiFENesin  1,200 mg Oral BID  . insulin aspart  0-15 Units Subcutaneous TID WC  . insulin glargine  28 Units Subcutaneous QHS  . ipratropium  0.5 mg Nebulization Q6H WA  . levalbuterol  0.63 mg Nebulization Q6H WA  . lisinopril  20 mg Oral Daily  . loratadine  10 mg Oral Daily  . LORazepam  0.5 mg Oral QHS  . multivitamin with minerals  1 tablet Oral Daily  . polyethylene glycol  17 g Oral Daily  . potassium chloride  20 mEq Oral Daily  . simvastatin  40 mg Oral QHS  . sodium chloride  3 mL Intravenous Q12H  . vitamin C  1,000 mg Oral Daily   Continuous Infusions:   Principal Problem:   Acute CHF (congestive heart failure) Active Problems:   PULMONARY SARCOIDOSIS   Obstructive sleep apnea   Peripheral edema   Respiratory failure with hypoxia   Anemia   Breast cancer, stage 1 s/p rt lumpectomy and radiation 2012   Diabetes mellitus   COPD (chronic obstructive pulmonary disease)   Chronic respiratory failure with hypoxia   Weakness generalized   Breast cancer   Bradycardia   Atrial fibrillation, persistent   Chronic anticoagulation, on Eliquis    Time spent: 30 mins    [facility]  [facility] Pager [phone]. If 7PM-7AM, please contact night-coverage at [email or web address], password [facility] 11/03/2013, 11:12 AM  LOS: 3 days

## 2013-11-04 ENCOUNTER — Ambulatory Visit: Payer: Medicare Other | Admitting: Internal Medicine

## 2013-11-04 DIAGNOSIS — I5033 Acute on chronic diastolic (congestive) heart failure: Principal | ICD-10-CM

## 2013-11-04 LAB — BASIC METABOLIC PANEL
BUN: 29 mg/dL — ABNORMAL HIGH (ref 6–23)
Calcium: 10.4 mg/dL (ref 8.4–10.5)
Creatinine, Ser: 0.7 mg/dL (ref 0.50–1.10)
GFR calc Af Amer: >90 mL/min (ref >90)
GFR calc non Af Amer: 83 mL/min — ABNORMAL LOW (ref >90)
Glucose, Bld: 107 mg/dL — ABNORMAL HIGH (ref 70–99)
Potassium: 3.9 mEq/L (ref 3.5–5.1)

## 2013-11-04 LAB — CBC
MCH: 24.5 pg — ABNORMAL LOW (ref 26.0–34.0)
MCHC: 30.5 g/dL (ref 30.0–36.0)
MCV: 80.3 fL (ref 78.0–100.0)
Platelets: 224 10*3/uL (ref 150–400)
RDW: 18.7 % — ABNORMAL HIGH (ref 11.5–15.5)
WBC: 7.2 10*3/uL (ref 4.0–10.5)

## 2013-11-04 LAB — GLUCOSE, CAPILLARY
Glucose-Capillary: 97 mg/dL (ref 70–99)
Glucose-Capillary: 236 mg/dL — ABNORMAL HIGH (ref 70–99)
Glucose-Capillary: 145 mg/dL — ABNORMAL HIGH (ref 70–99)
Glucose-Capillary: 187 mg/dL — ABNORMAL HIGH (ref 70–99)

## 2013-11-04 MED ORDER — ATORVASTATIN CALCIUM 20 MG PO TABS
20.0000 mg | ORAL_TABLET | Freq: Every day | ORAL | Status: DC
  Administered 2013-11-04 – 2013-11-06 (×3): 20 mg via ORAL
  Filled 2013-11-04 (×4): qty 1

## 2013-11-04 MED ORDER — BENZONATATE 100 MG PO CAPS
200.0000 mg | ORAL_CAPSULE | Freq: Three times a day (TID) | ORAL | Status: DC
  Administered 2013-11-04 – 2013-11-07 (×10): 200 mg via ORAL
  Filled 2013-11-04 (×12): qty 2

## 2013-11-04 MED ORDER — DILTIAZEM HCL 90 MG PO TABS
90.0000 mg | ORAL_TABLET | Freq: Three times a day (TID) | ORAL | Status: AC
  Administered 2013-11-04 – 2013-11-05 (×4): 90 mg via ORAL
  Filled 2013-11-04 (×5): qty 1

## 2013-11-04 NOTE — Progress Notes (Signed)
Pt placed on nasal CPAP at 9 CMH2O with 3 LPM O2 bleed in per home settings.  Pt tolerating well at this time, RT to monitor and assess as needed.

## 2013-11-04 NOTE — Progress Notes (Signed)
Subjective:  Feels better, breathing easier. Substantial net diuresis (weight down 11 lb) No further bradycardia. HR is optimal at rest, but increases substantially with exercise. Has been off all AV blocking meds since admission  Objective:  Temp:  [97.6 F (36.4 C)-98 F (36.7 C)] 97.6 F (36.4 C) (11/24 1447) Pulse Rate:  [57-88] 57 (11/24 1447) Resp:  [18-20] 20 (11/24 1447) BP: (111-129)/(56-75) 129/69 mmHg (11/24 1447) SpO2:  [89 %-99 %] 97 % (11/24 1524) Weight:  [248 lb 14.4 oz (112.9 kg)] 248 lb 14.4 oz (112.9 kg) (11/24 0514) Weight change: 7.1 oz (0.2 kg)  Intake/Output from previous day: 11/23 0701 - 11/24 0700 In: 803 [P.O.:800; I.V.:3] Out: 3526 [Urine:3525; Stool:1]  Intake/Output from this shift: Total I/O In: 240 [P.O.:240] Out: 851 [Urine:850; Stool:1]  Medications: Current Facility-Administered Medications  Medication Dose Route Frequency Provider Last Rate Last Dose  . 0.9 %  sodium chloride infusion  250 mL Intravenous PRN Nishant Dhungel, MD      . acetaminophen (TYLENOL) tablet 1,000 mg  1,000 mg Oral Q6H PRN Nishant Dhungel, MD      . acetaminophen (TYLENOL) tablet 650 mg  650 mg Oral Q4H PRN Nishant Dhungel, MD      . albuterol (PROVENTIL HFA;VENTOLIN HFA) 108 (90 BASE) MCG/ACT inhaler 2 puff  2 puff Inhalation Q6H PRN Nishant Dhungel, MD      . allopurinol (ZYLOPRIM) tablet 200 mg  200 mg Oral Daily Nishant Dhungel, MD   200 mg at 11/04/13 1043  . apixaban (ELIQUIS) tablet 5 mg  5 mg Oral BID Nishant Dhungel, MD   5 mg at 11/04/13 0801  . benzonatate (TESSALON) capsule 200 mg  200 mg Oral TID Rodolph Bong, MD   200 mg at 11/04/13 1712  . budesonide-formoterol (SYMBICORT) 160-4.5 MCG/ACT inhaler 2 puff  2 puff Inhalation BID Nishant Dhungel, MD   2 puff at 11/04/13 1610  . chlorpheniramine-HYDROcodone (TUSSIONEX) 10-8 MG/5ML suspension 5 mL  5 mL Oral Q12H PRN Nishant Dhungel, MD   5 mL at 11/03/13 1014  . colchicine tablet 0.6 mg  0.6 mg  Oral BID Nishant Dhungel, MD   0.6 mg at 11/04/13 1043  . diltiazem (CARDIZEM) tablet 90 mg  90 mg Oral Q8H Mersadie Kavanaugh, MD      . exemestane (AROMASIN) tablet 25 mg  25 mg Oral QPC breakfast Nishant Dhungel, MD   25 mg at 11/04/13 1043  . ferrous fumarate (HEMOCYTE - 106 mg FE) tablet 106 mg of iron  1 tablet Oral BID Nishant Dhungel, MD   106 mg of iron at 11/04/13 1043  . furosemide (LASIX) injection 40 mg  40 mg Intravenous Q12H Nishant Dhungel, MD   40 mg at 11/04/13 0801  . guaiFENesin (MUCINEX) 12 hr tablet 1,200 mg  1,200 mg Oral BID Nishant Dhungel, MD   1,200 mg at 11/04/13 1043  . HYDROcodone-acetaminophen (NORCO/VICODIN) 5-325 MG per tablet 1 tablet  1 tablet Oral Q6H PRN Eddie North, MD   1 tablet at 11/04/13 0801  . insulin aspart (novoLOG) injection 0-15 Units  0-15 Units Subcutaneous TID WC Nishant Dhungel, MD   2 Units at 11/04/13 1712  . insulin glargine (LANTUS) injection 28 Units  28 Units Subcutaneous QHS Nishant Dhungel, MD   28 Units at 11/03/13 2145  . ipratropium (ATROVENT) nebulizer solution 0.5 mg  0.5 mg Nebulization Q6H WA Renae Fickle, MD   0.5 mg at 11/04/13 1524  . levalbuterol (XOPENEX) nebulizer solution 0.63 mg  0.63 mg Nebulization Q6H WA Renae Fickle, MD   0.63 mg at 11/04/13 1524  . levalbuterol (XOPENEX) nebulizer solution 0.63 mg  0.63 mg Nebulization Q4H PRN Renae Fickle, MD      . lisinopril (PRINIVIL,ZESTRIL) tablet 20 mg  20 mg Oral Daily Nishant Dhungel, MD   20 mg at 11/04/13 1043  . loratadine (CLARITIN) tablet 10 mg  10 mg Oral Daily Nishant Dhungel, MD   10 mg at 11/04/13 1043  . LORazepam (ATIVAN) tablet 0.5 mg  0.5 mg Oral QHS Nishant Dhungel, MD   0.5 mg at 11/03/13 2143  . meclizine (ANTIVERT) tablet 6.25-12.5 mg  6.25-12.5 mg Oral TID PRN Nishant Dhungel, MD      . multivitamin with minerals tablet 1 tablet  1 tablet Oral Daily Nishant Dhungel, MD   1 tablet at 11/04/13 1043  . ondansetron (ZOFRAN) injection 4 mg  4 mg Intravenous  Q6H PRN Nishant Dhungel, MD      . ondansetron (ZOFRAN) tablet 4 mg  4 mg Oral Q8H PRN Nishant Dhungel, MD      . polyethylene glycol (MIRALAX / GLYCOLAX) packet 17 g  17 g Oral Daily Nishant Dhungel, MD   17 g at 11/04/13 1043  . potassium chloride SA (K-DUR,KLOR-CON) CR tablet 20 mEq  20 mEq Oral Daily Nishant Dhungel, MD   20 mEq at 11/04/13 1043  . simvastatin (ZOCOR) tablet 40 mg  40 mg Oral QHS Nishant Dhungel, MD   40 mg at 11/03/13 2143  . sodium chloride 0.9 % injection 3 mL  3 mL Intravenous Q12H Nishant Dhungel, MD   3 mL at 11/04/13 1000  . sodium chloride 0.9 % injection 3 mL  3 mL Intravenous PRN Nishant Dhungel, MD   3 mL at 11/03/13 1013  . vitamin C (ASCORBIC ACID) tablet 1,000 mg  1,000 mg Oral Daily Nishant Dhungel, MD   1,000 mg at 11/04/13 1043    Physical Exam: General appearance: alert, cooperative, no distress and moderately obese Neck: no adenopathy, no carotid bruit, no JVD, supple, symmetrical, trachea midline and thyroid not enlarged, symmetric, no tenderness/mass/nodules Lungs: clear to auscultation bilaterally Heart: irregularly irregular rhythm and S1, S2 normal Extremities: extremities normal, atraumatic, no cyanosis or edema Pulses: 2+ and symmetric Neurologic: Alert and oriented X 3, normal strength and tone. Normal symmetric reflexes. Normal coordination and gait  Lab Results: Results for orders placed during the hospital encounter of 10/31/13 (from the past 48 hour(s))  GLUCOSE, CAPILLARY     Status: Abnormal   Collection Time    11/02/13  9:23 PM      Result Value Range   Glucose-Capillary 160 (*) 70 - 99 mg/dL  BASIC METABOLIC PANEL     Status: Abnormal   Collection Time    11/03/13  5:00 AM      Result Value Range   Sodium 141  135 - 145 mEq/L   Potassium 3.9  3.5 - 5.1 mEq/L   Chloride 95 (*) 96 - 112 mEq/L   CO2 38 (*) 19 - 32 mEq/L   Glucose, Bld 88  70 - 99 mg/dL   BUN 24 (*) 6 - 23 mg/dL   Creatinine, Ser 1.61  0.50 - 1.10 mg/dL    Calcium 09.6  8.4 - 10.5 mg/dL   GFR calc non Af Amer 64 (*) >90 mL/min   GFR calc Af Amer 74 (*) >90 mL/min   Comment: (NOTE)     The eGFR has been calculated using the  CKD EPI equation.     This calculation has not been validated in all clinical situations.     eGFR's persistently <90 mL/min signify possible Chronic Kidney     Disease.  GLUCOSE, CAPILLARY     Status: None   Collection Time    11/03/13  7:23 AM      Result Value Range   Glucose-Capillary 83  70 - 99 mg/dL  GLUCOSE, CAPILLARY     Status: Abnormal   Collection Time    11/03/13 12:36 PM      Result Value Range   Glucose-Capillary 132 (*) 70 - 99 mg/dL  GLUCOSE, CAPILLARY     Status: Abnormal   Collection Time    11/03/13  4:22 PM      Result Value Range   Glucose-Capillary 179 (*) 70 - 99 mg/dL   Comment 1 Documented in Chart     Comment 2 Notify RN    GLUCOSE, CAPILLARY     Status: Abnormal   Collection Time    11/03/13  9:23 PM      Result Value Range   Glucose-Capillary 130 (*) 70 - 99 mg/dL  BASIC METABOLIC PANEL     Status: Abnormal   Collection Time    11/04/13  4:12 AM      Result Value Range   Sodium 139  135 - 145 mEq/L   Potassium 3.9  3.5 - 5.1 mEq/L   Chloride 94 (*) 96 - 112 mEq/L   CO2 37 (*) 19 - 32 mEq/L   Glucose, Bld 107 (*) 70 - 99 mg/dL   BUN 29 (*) 6 - 23 mg/dL   Creatinine, Ser 1.61  0.50 - 1.10 mg/dL   Calcium 09.6  8.4 - 04.5 mg/dL   GFR calc non Af Amer 83 (*) >90 mL/min   GFR calc Af Amer >90  >90 mL/min   Comment: (NOTE)     The eGFR has been calculated using the CKD EPI equation.     This calculation has not been validated in all clinical situations.     eGFR's persistently <90 mL/min signify possible Chronic Kidney     Disease.  CBC     Status: Abnormal   Collection Time    11/04/13  4:12 AM      Result Value Range   WBC 7.2  4.0 - 10.5 K/uL   RBC 5.18 (*) 3.87 - 5.11 MIL/uL   Hemoglobin 12.7  12.0 - 15.0 g/dL   HCT 40.9  81.1 - 91.4 %   MCV 80.3  78.0 - 100.0 fL    MCH 24.5 (*) 26.0 - 34.0 pg   MCHC 30.5  30.0 - 36.0 g/dL   RDW 78.2 (*) 95.6 - 21.3 %   Platelets 224  150 - 400 K/uL   Comment: DELTA CHECK NOTED  GLUCOSE, CAPILLARY     Status: None   Collection Time    11/04/13  7:37 AM      Result Value Range   Glucose-Capillary 97  70 - 99 mg/dL  GLUCOSE, CAPILLARY     Status: Abnormal   Collection Time    11/04/13 11:12 AM      Result Value Range   Glucose-Capillary 236 (*) 70 - 99 mg/dL  GLUCOSE, CAPILLARY     Status: Abnormal   Collection Time    11/04/13  4:35 PM      Result Value Range   Glucose-Capillary 145 (*) 70 - 99 mg/dL    Imaging:  Imaging results have been reviewed and No results found.  Assessment:  1. Principal Problem: 2.   Acute on chronic diastolic CHF (congestive heart failure) 3. Active Problems: 4.   PULMONARY SARCOIDOSIS 5.   Obstructive sleep apnea 6.   Peripheral edema 7.   Respiratory failure with hypoxia 8.   Anemia 9.   Breast cancer, stage 1 s/p rt lumpectomy and radiation 2012 10.   Diabetes mellitus 11.   COPD (chronic obstructive pulmonary disease) 12.   Chronic respiratory failure with hypoxia 13.   Weakness generalized 14.   Breast cancer 15.   Bradycardia 16.   Atrial fibrillation, persistent 17.   Chronic anticoagulation, on Eliquis 18.   Plan:  1. Previously on both betablockers and SR diltiazem. Will stop metoprolol permanently and resume diltiazem in a slightly lower dose. Use immediate release to allow quicker titration, then switch to an equivalent SR dose.  Time Spent Directly with Patient:  25 minutes  Length of Stay:  LOS: 4 days    Antonia Culbertson 11/04/2013, 5:15 PM

## 2013-11-04 NOTE — Progress Notes (Signed)
[facility] PROGRESS NOTE  [name] ZOX:[id number] DOB: 07/02/1941 DOA: 10/31/2013 PCP: [name], MD  Assessment/Plan: #1 acute on chronic diastolic CHF exacerbation Clinical improvement. Questionable etiology. I./o equal -2.7 L/24hrs. cardiac enzymes negative x3. TSH within normal limits of 4.065. 2-D echo with EF of 55-60%. No wall motion abnormalities. Current weight is 248lbs ( 112.7kg)  from 259 pounds 117.8 kg. Continue IV Lasix, lisinopril, Zocor. Cardiology following and appreciate input and recommendations.  #2 bradycardia Improved off beta blockers. Cardiac enzymes negative x3. 2-D echo with no valvular abnormalities. HR now in 50s-90s. ?? Resume diltiazem, will defer to cardiology. Cardiology following.  #3 anemia H&H stable. Continue iron supplements.  #4 stage I breast cancer status post right lumpectomy radiation 2012. Continue Aromasin. Oncology has been notified.  #5 diabetes mellitus Continue sliding scale insulin. Continue to hold oral hypoglycemics.  #6 breast cancer status post right lumpectomy radiation 2012 On Aromasin. Oncology has been notified.  #7 atrial fibrillation Currently rate controlled. Beta blocker was stopped secondary to bradycardia. HR 57- 90s. ?? Resume diltiazem. Will defer to cardiology. On oral anticoagulation. Per cardiology.  #8 history of sarcoidosis Stable.  #9 obstructive sleep apnea CPAP each bedtime.  #10 chronic respiratory failure Patient with chronic COPD on home O2. Continue nebs and inhalers. Follow.  Code Status: Full Family Communication: Updated patient. Updated son last night over phone. Disposition Plan: Home versus SNF when medically stable.   Consultants:  Cardiology: Dr. Allyson Sabal 11/01/2013  Procedures:  2-D echo 11/01/2013  Chest x-ray 10/31/2013  Antibiotics:  None  HPI/Subjective: Patient states SOB improved.  Objective: Filed Vitals:   11/04/13 0514  BP: 122/75  Pulse: 76   Temp: 98 F (36.7 C)  Resp: 18    Intake/Output Summary (Last 24 hours) at 11/04/13 0942 Last data filed at 11/04/13 0300  Gross per 24 hour  Intake    683 ml  Output   3326 ml  Net  -2643 ml   Filed Weights   11/02/13 0314 11/03/13 0446 11/04/13 0514  Weight: 117.8 kg (259 lb 11.2 oz) 112.7 kg (248 lb 7.3 oz) 112.9 kg (248 lb 14.4 oz)    Exam:   General:  NAD  Cardiovascular: RRR  Respiratory: Bibasilar crackles.  Abdomen: soft/NT/ND/+BS  Musculoskeletal: No c/c. Trace LE edema   Data Reviewed: Basic Metabolic Panel:  Recent Labs Lab 10/31/13 2210 11/01/13 0755 11/02/13 0440 11/03/13 0500 11/04/13 0412  NA 138 139 141 141 139  K 3.9 3.4* 3.7 3.9 3.9  CL 97 99 96 95* 94*  CO2 31 33* 38* 38* 37*  GLUCOSE 175* 130* 83 88 107*  BUN 23 21 20  24* 29*  CREATININE 0.83 0.76 0.95 0.87 0.70  CALCIUM 10.0 9.7 9.9 10.3 10.4   Liver Function Tests:  Recent Labs Lab 10/31/13 2210  AST 18  ALT 10  ALKPHOS 91  BILITOT 0.6  PROT 7.1  ALBUMIN 3.9   No results found for this basename: LIPASE, AMYLASE,  in the last 168 hours No results found for this basename: AMMONIA,  in the last 168 hours CBC:  Recent Labs Lab 10/31/13 1514 10/31/13 2210 11/01/13 0755 11/02/13 0440 11/04/13 0412  WBC 7.9 7.1 5.9 6.6 7.2  NEUTROABS 5.7 4.7  --   --   --   HGB 11.3* 11.2* 10.6* 11.4* 12.7  HCT 34.5* 36.0 34.3* 37.0 41.6  MCV 76.7* 80.2 79.8 81.0 80.3  PLT 228.0 194 163 171 224   Cardiac Enzymes:  Recent Labs  Lab 11/01/13 1216 11/01/13 1626 11/01/13 2255  TROPONINI <0.30 <0.30 <0.30   BNP (last 3 results)  Recent Labs  05/11/13 0450 10/31/13 1514 10/31/13 2210  PROBNP 1374.0* 305.0* 2189.0*   CBG:  Recent Labs Lab 11/03/13 0723 11/03/13 1236 11/03/13 1622 11/03/13 2123 11/04/13 0737  GLUCAP 83 132* 179* 130* 97    Recent Results (from the past 240 hour(s))  MRSA PCR SCREENING     Status: None   Collection Time    11/01/13  3:35 AM       Result Value Range Status   MRSA by PCR NEGATIVE  NEGATIVE Final   Comment:            The GeneXpert MRSA Assay (FDA     approved for NASAL specimens     only), is one component of a     comprehensive MRSA colonization     surveillance program. It is not     intended to diagnose MRSA     infection nor to guide or     monitor treatment for     MRSA infections.     Studies: No results found.  Scheduled Meds: . allopurinol  200 mg Oral Daily  . apixaban  5 mg Oral BID  . budesonide-formoterol  2 puff Inhalation BID  . colchicine  0.6 mg Oral BID  . exemestane  25 mg Oral QPC breakfast  . ferrous fumarate  1 tablet Oral BID  . furosemide  40 mg Intravenous Q12H  . guaiFENesin  1,200 mg Oral BID  . insulin aspart  0-15 Units Subcutaneous TID WC  . insulin glargine  28 Units Subcutaneous QHS  . ipratropium  0.5 mg Nebulization Q6H WA  . levalbuterol  0.63 mg Nebulization Q6H WA  . lisinopril  20 mg Oral Daily  . loratadine  10 mg Oral Daily  . LORazepam  0.5 mg Oral QHS  . multivitamin with minerals  1 tablet Oral Daily  . polyethylene glycol  17 g Oral Daily  . potassium chloride  20 mEq Oral Daily  . simvastatin  40 mg Oral QHS  . sodium chloride  3 mL Intravenous Q12H  . vitamin C  1,000 mg Oral Daily   Continuous Infusions:   Principal Problem:   Acute on chronic diastolic CHF (congestive heart failure) Active Problems:   PULMONARY SARCOIDOSIS   Obstructive sleep apnea   Peripheral edema   Respiratory failure with hypoxia   Anemia   Breast cancer, stage 1 s/p rt lumpectomy and radiation 2012   Diabetes mellitus   COPD (chronic obstructive pulmonary disease)   Chronic respiratory failure with hypoxia   Weakness generalized   Breast cancer   Bradycardia   Atrial fibrillation, persistent   Chronic anticoagulation, on Eliquis    Time spent: 30 mins    [facility]  [facility] Pager [phone]. If 7PM-7AM, please contact night-coverage at  [email or web address], password [facility] 11/04/2013, 9:42 AM  LOS: 4 days

## 2013-11-04 NOTE — Progress Notes (Signed)
Physical Therapy Treatment Patient Details Name: [name] MRN: [id number] DOB: 12/12/1941 Today's Date: 11/04/2013 Time: 4098-1191 PT Time Calculation (min): 24 min  PT Assessment / Plan / Recommendation  History of Present Illness 72 year old female with history of COPD with chronic hypoxic respiratory failure on 2 L O2 via nasal cannula, OSA on CPAP, sarcoidosis, history of breast cancer status post lumpectomy and radiation therapy (follows with Dr. [name]), diabetes mellitus, anemia, persistent/permanent A. fib on Eliquis.  She was sent to the hospital from skilled nursing facility as she was found to be bradycardic in the 40s. Patient reports being short of breath on exertion progressively over the last 1 month. She does have baseline 2 pillow orthopnea. She reports dyspnea on minimal exertion but denies PND. She also has increased leg swelling for few weeks   PT Comments   Pt reports she would like to consider DC to home vs. Return to SNF. Pt's HR up into 140's during mobility. RN aware. Pt does live alone. States family checks on her frequently. No family present to discuss home DC.  Follow Up Recommendations   (depends on caregiver support.)     Does the patient have the potential to tolerate intense rehabilitation     Barriers to Discharge        Equipment Recommendations  None recommended by PT    Recommendations for Other Services    Frequency Min 3X/week   Progress towards PT Goals Progress towards PT goals: Progressing toward goals  Plan Current plan remains appropriate;Discharge plan needs to be updated (pt reports interes to DC to home vs. SNF. states she was about to DC home  prior to readmit to hospital)    Precautions / Restrictions Precautions Precautions: Fall Precaution Comments: home O2, incr. HR Restrictions Weight Bearing Restrictions: No   Pertinent Vitals/Pain Sats on 2 l >94% HR 120-140's.    Mobility  Bed Mobility Bed Mobility: Not  assessed Transfers Sit to Stand: 4: Min guard;From chair/3-in-1;With upper extremity assist Stand to Sit: 4: Min guard;With upper extremity assist;With armrests Details for Transfer Assistance: pt holds on RW w/ 1 UE to stand Ambulation/Gait Ambulation/Gait Assistance: 4: Min assist Ambulation Distance (Feet): 30 Feet Assistive device: Rolling walker Ambulation/Gait Assistance Details: slow and steady  w/ RW.    Exercises     PT Diagnosis:    PT Problem List:   PT Treatment Interventions:     PT Goals (current goals can now be found in the care plan section)    Visit Information  Last PT Received On: 11/04/13 Assistance Needed: +1 History of Present Illness: 72 year old female with history of COPD with chronic hypoxic respiratory failure on 2 L O2 via nasal cannula, OSA on CPAP, sarcoidosis, history of breast cancer status post lumpectomy and radiation therapy (follows with Dr. [name]), diabetes mellitus, anemia, persistent/permanent A. fib on Eliquis.  She was sent to the hospital from skilled nursing facility as she was found to be bradycardic in the 40s. Patient reports being short of breath on exertion progressively over the last 1 month. She does have baseline 2 pillow orthopnea. She reports dyspnea on minimal exertion but denies PND. She also has increased leg swelling for few weeks    Subjective Data      Cognition  Cognition Arousal/Alertness: Awake/alert    Balance  Balance Balance Assessed: Yes Dynamic Standing Balance Dynamic Standing - Balance Support: Right upper extremity supported Dynamic Standing - Comments: ablwe to balance to stand  to wash peri area holding onto RW with 1 UE  End of Session PT - End of Session Activity Tolerance: Patient tolerated treatment well;Treatment limited secondary to medical complications (Comment) (HR into 140's) Patient left: with call bell/phone within reach;in bed Nurse Communication: Mobility status   GP     Rada Hay 11/04/2013, 11:43 AM Blanchard Kelch PT [phone]

## 2013-11-04 NOTE — Clinical Documentation Improvement (Signed)
2 Queries contained in this BPA  Clarification # 1  Documentation of Acute CHF noted in medical record.Marland KitchenMarland KitchenThank you.  Please note the Type of CHF being treated for this admission  Possible Clinical Conditions?  Acute Systolic Congestive Heart Failure Acute Diastolic Congestive Heart Failure Acute Systolic & Diastolic Congestive Heart Failure Acute on Chronic Systolic Congestive Heart Failure Acute on Chronic Diastolic Congestive Heart Failure Acute on Chronic Systolic & Diastolic Congestive Heart Failure Other Condition________________________________________ Cannot Clinically Determine  Supporting Information:  Risk Factors: Acute CHF Acute on chronic Respiratory failure Breast ca Sarcoidosis Atrial Fib Signs & Symptoms: Diagnostics: Component      Pro B Natriuretic peptide (BNP)  Latest Ref Rng      0 - 125 pg/mL  10/31/2013     10:10 PM 2189.0 (H)   Treatment:  oxygen therapy  CPAP  lisinopril (PRINIVIL,ZESTRIL) tablet 20 mg  furosemide (LASIX) injection 40 mg   levalbuterol (XOPENEX) nebulizer solution    lisinopril (PRINIVIL,ZESTRIL) tablet 20 mg   Clarification #2  Pt with Morbid obesity and on CPAP  Please clarify if morbid obesity in setting of chronic hypoxic respiratory failure and use of CPAP with BMI=50.24 can qualify for one of the diagnosis listed below.    OHS (Obesity hypoventilation syndrome)  Other Condition________________________________________  Cannot Clinically Determine  Supporting information H/P 11/01/13 72 year old morbidly obese female with history of COPD with chronic hypoxic respiratory failure on 2 L O2 via nasal cannula, OSA on CPAP, sarcoidosis, history of breast cancer status post lumpectomy and radiation therapy (follows with Dr. [name]), diabetes mellitus, anemia, A. fib on  Eliquis, HL was sent to the hospital from skilled nursing facility as she was found to be bradycardic in the 40s.  Treatment Oxygen  therapy levalbuterol (XOPENEX) nebulizer solution    Thank You, Enis Slipper ,RN Clinical Documentation Specialist:  [phone]  [facility]- Health Information Management

## 2013-11-05 DIAGNOSIS — R5381 Other malaise: Secondary | ICD-10-CM

## 2013-11-05 DIAGNOSIS — D869 Sarcoidosis, unspecified: Secondary | ICD-10-CM

## 2013-11-05 LAB — BASIC METABOLIC PANEL
BUN: 33 mg/dL — ABNORMAL HIGH (ref 6–23)
Calcium: 10.2 mg/dL (ref 8.4–10.5)
Chloride: 95 mEq/L — ABNORMAL LOW (ref 96–112)
Creatinine, Ser: 0.89 mg/dL (ref 0.50–1.10)
GFR calc Af Amer: 72 mL/min — ABNORMAL LOW (ref >90)
Glucose, Bld: 125 mg/dL — ABNORMAL HIGH (ref 70–99)

## 2013-11-05 LAB — GLUCOSE, CAPILLARY
Glucose-Capillary: 115 mg/dL — ABNORMAL HIGH (ref 70–99)
Glucose-Capillary: 187 mg/dL — ABNORMAL HIGH (ref 70–99)
Glucose-Capillary: 185 mg/dL — ABNORMAL HIGH (ref 70–99)

## 2013-11-05 MED ORDER — IPRATROPIUM BROMIDE 0.02 % IN SOLN
0.5000 mg | Freq: Three times a day (TID) | RESPIRATORY_TRACT | Status: DC
  Administered 2013-11-06 – 2013-11-07 (×4): 0.5 mg via RESPIRATORY_TRACT
  Filled 2013-11-05 (×5): qty 2.5

## 2013-11-05 MED ORDER — DILTIAZEM HCL ER COATED BEADS 240 MG PO CP24
240.0000 mg | ORAL_CAPSULE | Freq: Every day | ORAL | Status: DC
  Administered 2013-11-06 – 2013-11-07 (×2): 240 mg via ORAL
  Filled 2013-11-05 (×2): qty 1

## 2013-11-05 MED ORDER — FUROSEMIDE 40 MG PO TABS
40.0000 mg | ORAL_TABLET | Freq: Two times a day (BID) | ORAL | Status: DC
  Administered 2013-11-05 – 2013-11-07 (×4): 40 mg via ORAL
  Filled 2013-11-05 (×6): qty 1

## 2013-11-05 MED ORDER — LEVALBUTEROL HCL 0.63 MG/3ML IN NEBU
0.6300 mg | INHALATION_SOLUTION | Freq: Three times a day (TID) | RESPIRATORY_TRACT | Status: DC
  Administered 2013-11-06 – 2013-11-07 (×4): 0.63 mg via RESPIRATORY_TRACT
  Filled 2013-11-05 (×11): qty 3

## 2013-11-05 NOTE — Progress Notes (Signed)
[facility] PROGRESS NOTE  [name] ION:[id number] DOB: 07/14/1941 DOA: 10/31/2013 PCP: [name], MD  Assessment/Plan: #1 acute on chronic diastolic CHF exacerbation Clinical improvement. Questionable etiology. I./o equal -1.09 L/24hrs. cardiac enzymes negative x3. TSH within normal limits of 4.065. 2-D echo with EF of 55-60%. No wall motion abnormalities. Current weight is 248lbs ( 112.7kg)  from 259 pounds 117.8 kg. Continue IV Lasix and transitioned to oral per cardiology. Continue lisinopril, Zocor. Cardiology following and appreciate input and recommendations.  #2 bradycardia Improved off beta blockers. Cardiac enzymes negative x3. 2-D echo with no valvular abnormalities. HR now in 50s-90s. Diltiazem has been resumed per cardiology. Follow heart rate. Cardiology following.  #3 anemia H&H stable. Continue iron supplements.  #4 stage I breast cancer status post right lumpectomy radiation 2012. Continue Aromasin. Oncology has been notified.  #5 diabetes mellitus Continue sliding scale insulin. Continue to hold oral hypoglycemics.  #6 breast cancer status post right lumpectomy radiation 2012 On Aromasin. Oncology has been notified.  #7 atrial fibrillation Currently rate controlled. Beta blocker was stopped secondary to bradycardia. HR 57- 90s. ?? Diltiazem resumed per cardiology. Continue IV Lasix. We'll transitioned to oral Lasix per cardiology. On oral anticoagulation. Per cardiology.  #8 history of sarcoidosis Stable.  #9 obstructive sleep apnea CPAP each bedtime.  #10 chronic respiratory failure Patient with chronic COPD on home O2. Continue nebs and inhalers. Follow.  Code Status: Full Family Communication: Updated patient. Updated son last night over phone. Disposition Plan: Home versus SNF when medically stable.   Consultants:  Cardiology: Dr. Allyson Sabal 11/01/2013  Procedures:  2-D echo 11/01/2013  Chest x-ray  10/31/2013  Antibiotics:  None  HPI/Subjective: Patient states SOB improved. No complaints. And  Objective: Filed Vitals:   11/05/13 1059  BP: 96/52  Pulse:   Temp:   Resp:     Intake/Output Summary (Last 24 hours) at 11/05/13 1122 Last data filed at 11/05/13 0842  Gross per 24 hour  Intake    720 ml  Output   1600 ml  Net   -880 ml   Filed Weights   11/03/13 0446 11/04/13 0514 11/05/13 0555  Weight: 112.7 kg (248 lb 7.3 oz) 112.9 kg (248 lb 14.4 oz) 110.8 kg (244 lb 4.3 oz)    Exam:   General:  NAD  Cardiovascular: RRR  Respiratory: Bibasilar crackles.  Abdomen: soft/NT/ND/+BS  Musculoskeletal: No c/c. Trace LE edema   Data Reviewed: Basic Metabolic Panel:  Recent Labs Lab 11/01/13 0755 11/02/13 0440 11/03/13 0500 11/04/13 0412 11/05/13 0350  NA 139 141 141 139 139  K 3.4* 3.7 3.9 3.9 4.0  CL 99 96 95* 94* 95*  CO2 33* 38* 38* 37* 37*  GLUCOSE 130* 83 88 107* 125*  BUN 21 20 24* 29* 33*  CREATININE 0.76 0.95 0.87 0.70 0.89  CALCIUM 9.7 9.9 10.3 10.4 10.2   Liver Function Tests:  Recent Labs Lab 10/31/13 2210  AST 18  ALT 10  ALKPHOS 91  BILITOT 0.6  PROT 7.1  ALBUMIN 3.9   No results found for this basename: LIPASE, AMYLASE,  in the last 168 hours No results found for this basename: AMMONIA,  in the last 168 hours CBC:  Recent Labs Lab 10/31/13 1514 10/31/13 2210 11/01/13 0755 11/02/13 0440 11/04/13 0412  WBC 7.9 7.1 5.9 6.6 7.2  NEUTROABS 5.7 4.7  --   --   --   HGB 11.3* 11.2* 10.6* 11.4* 12.7  HCT 34.5* 36.0 34.3* 37.0 41.6  MCV 76.7*  80.2 79.8 81.0 80.3  PLT 228.0 194 163 171 224   Cardiac Enzymes:  Recent Labs Lab 11/01/13 1216 11/01/13 1626 11/01/13 2255  TROPONINI <0.30 <0.30 <0.30   BNP (last 3 results)  Recent Labs  05/11/13 0450 10/31/13 1514 10/31/13 2210  PROBNP 1374.0* 305.0* 2189.0*   CBG:  Recent Labs Lab 11/04/13 0737 11/04/13 1112 11/04/13 1635 11/04/13 2104 11/05/13 0734  GLUCAP  97 236* 145* 187* 115*    Recent Results (from the past 240 hour(s))  MRSA PCR SCREENING     Status: None   Collection Time    11/01/13  3:35 AM      Result Value Range Status   MRSA by PCR NEGATIVE  NEGATIVE Final   Comment:            The GeneXpert MRSA Assay (FDA     approved for NASAL specimens     only), is one component of a     comprehensive MRSA colonization     surveillance program. It is not     intended to diagnose MRSA     infection nor to guide or     monitor treatment for     MRSA infections.     Studies: No results found.  Scheduled Meds: . allopurinol  200 mg Oral Daily  . apixaban  5 mg Oral BID  . atorvastatin  20 mg Oral QHS  . benzonatate  200 mg Oral TID  . budesonide-formoterol  2 puff Inhalation BID  . colchicine  0.6 mg Oral BID  . diltiazem  90 mg Oral Q8H  . exemestane  25 mg Oral QPC breakfast  . ferrous fumarate  1 tablet Oral BID  . furosemide  40 mg Intravenous Q12H  . guaiFENesin  1,200 mg Oral BID  . insulin aspart  0-15 Units Subcutaneous TID WC  . insulin glargine  28 Units Subcutaneous QHS  . ipratropium  0.5 mg Nebulization Q6H WA  . levalbuterol  0.63 mg Nebulization Q6H WA  . lisinopril  20 mg Oral Daily  . loratadine  10 mg Oral Daily  . LORazepam  0.5 mg Oral QHS  . multivitamin with minerals  1 tablet Oral Daily  . polyethylene glycol  17 g Oral Daily  . potassium chloride  20 mEq Oral Daily  . sodium chloride  3 mL Intravenous Q12H  . vitamin C  1,000 mg Oral Daily   Continuous Infusions:   Principal Problem:   Acute on chronic diastolic CHF (congestive heart failure) Active Problems:   PULMONARY SARCOIDOSIS   Obstructive sleep apnea   Peripheral edema   Respiratory failure with hypoxia   Anemia   Breast cancer, stage 1 s/p rt lumpectomy and radiation 2012   Diabetes mellitus   COPD (chronic obstructive pulmonary disease)   Chronic respiratory failure with hypoxia   Weakness generalized   Breast cancer    Bradycardia   Atrial fibrillation, persistent   Chronic anticoagulation, on Eliquis    Time spent: 30 mins    [facility]  [facility] Pager [phone]. If 7PM-7AM, please contact night-coverage at [email or web address], password [facility] 11/05/2013, 11:22 AM  LOS: 5 days

## 2013-11-05 NOTE — Progress Notes (Signed)
Subjective: No complaints.    Objective: Vital signs in last 24 hours: Temp:  [97.5 F (36.4 C)-98.3 F (36.8 C)] 98.3 F (36.8 C) (11/25 0555) Pulse Rate:  [57-97] 83 (11/25 0555) Resp:  [16-20] 16 (11/25 0555) BP: (98-129)/(52-69) 105/54 mmHg (11/25 0555) SpO2:  [93 %-100 %] 93 % (11/25 0852) Weight:  [244 lb 4.3 oz (110.8 kg)] 244 lb 4.3 oz (110.8 kg) (11/25 0555) Last BM Date: 11/04/13  Intake/Output from previous day: 11/24 0701 - 11/25 0700 In: 960 [P.O.:960] Out: 2051 [Urine:2050; Stool:1] Intake/Output this shift: Total I/O In: -  Out: 200 [Urine:200]  Medications Current Facility-Administered Medications  Medication Dose Route Frequency Provider Last Rate Last Dose  . 0.9 %  sodium chloride infusion  250 mL Intravenous PRN Nishant Dhungel, MD      . acetaminophen (TYLENOL) tablet 1,000 mg  1,000 mg Oral Q6H PRN Nishant Dhungel, MD      . acetaminophen (TYLENOL) tablet 650 mg  650 mg Oral Q4H PRN Nishant Dhungel, MD      . albuterol (PROVENTIL HFA;VENTOLIN HFA) 108 (90 BASE) MCG/ACT inhaler 2 puff  2 puff Inhalation Q6H PRN Nishant Dhungel, MD      . allopurinol (ZYLOPRIM) tablet 200 mg  200 mg Oral Daily Nishant Dhungel, MD   200 mg at 11/04/13 1043  . apixaban (ELIQUIS) tablet 5 mg  5 mg Oral BID Nishant Dhungel, MD   5 mg at 11/05/13 0735  . atorvastatin (LIPITOR) tablet 20 mg  20 mg Oral QHS Tad Moore [place], RPH   20 mg at 11/04/13 2118  . benzonatate (TESSALON) capsule 200 mg  200 mg Oral TID Rodolph Bong, MD   200 mg at 11/04/13 2115  . budesonide-formoterol (SYMBICORT) 160-4.5 MCG/ACT inhaler 2 puff  2 puff Inhalation BID Eddie North, MD   2 puff at 11/05/13 0851  . chlorpheniramine-HYDROcodone (TUSSIONEX) 10-8 MG/5ML suspension 5 mL  5 mL Oral Q12H PRN Nishant Dhungel, MD   5 mL at 11/03/13 1014  . colchicine tablet 0.6 mg  0.6 mg Oral BID Nishant Dhungel, MD   0.6 mg at 11/04/13 2115  . diltiazem (CARDIZEM) tablet 90 mg  90 mg Oral Q8H  Mihai Croitoru, MD   90 mg at 11/05/13 0654  . exemestane (AROMASIN) tablet 25 mg  25 mg Oral QPC breakfast Nishant Dhungel, MD   25 mg at 11/05/13 0928  . ferrous fumarate (HEMOCYTE - 106 mg FE) tablet 106 mg of iron  1 tablet Oral BID Nishant Dhungel, MD   106 mg of iron at 11/04/13 2115  . furosemide (LASIX) injection 40 mg  40 mg Intravenous Q12H Nishant Dhungel, MD   40 mg at 11/05/13 0735  . guaiFENesin (MUCINEX) 12 hr tablet 1,200 mg  1,200 mg Oral BID Nishant Dhungel, MD   1,200 mg at 11/04/13 2115  . HYDROcodone-acetaminophen (NORCO/VICODIN) 5-325 MG per tablet 1 tablet  1 tablet Oral Q6H PRN Eddie North, MD   1 tablet at 11/05/13 0846  . insulin aspart (novoLOG) injection 0-15 Units  0-15 Units Subcutaneous TID WC Nishant Dhungel, MD   2 Units at 11/04/13 1712  . insulin glargine (LANTUS) injection 28 Units  28 Units Subcutaneous QHS Nishant Dhungel, MD   28 Units at 11/04/13 2113  . ipratropium (ATROVENT) nebulizer solution 0.5 mg  0.5 mg Nebulization Q6H WA Renae Fickle, MD   0.5 mg at 11/05/13 0850  . levalbuterol (XOPENEX) nebulizer solution 0.63 mg  0.63 mg Nebulization Q6H  WA Renae Fickle, MD   0.63 mg at 11/05/13 0850  . levalbuterol (XOPENEX) nebulizer solution 0.63 mg  0.63 mg Nebulization Q4H PRN Renae Fickle, MD      . lisinopril (PRINIVIL,ZESTRIL) tablet 20 mg  20 mg Oral Daily Nishant Dhungel, MD   20 mg at 11/04/13 1043  . loratadine (CLARITIN) tablet 10 mg  10 mg Oral Daily Nishant Dhungel, MD   10 mg at 11/04/13 1043  . LORazepam (ATIVAN) tablet 0.5 mg  0.5 mg Oral QHS Nishant Dhungel, MD   0.5 mg at 11/04/13 2116  . meclizine (ANTIVERT) tablet 6.25-12.5 mg  6.25-12.5 mg Oral TID PRN Nishant Dhungel, MD      . multivitamin with minerals tablet 1 tablet  1 tablet Oral Daily Nishant Dhungel, MD   1 tablet at 11/04/13 1043  . ondansetron (ZOFRAN) injection 4 mg  4 mg Intravenous Q6H PRN Nishant Dhungel, MD      . ondansetron (ZOFRAN) tablet 4 mg  4 mg Oral Q8H PRN  Nishant Dhungel, MD      . polyethylene glycol (MIRALAX / GLYCOLAX) packet 17 g  17 g Oral Daily Nishant Dhungel, MD   17 g at 11/04/13 1043  . potassium chloride SA (K-DUR,KLOR-CON) CR tablet 20 mEq  20 mEq Oral Daily Nishant Dhungel, MD   20 mEq at 11/04/13 1043  . sodium chloride 0.9 % injection 3 mL  3 mL Intravenous Q12H Nishant Dhungel, MD   3 mL at 11/04/13 2118  . sodium chloride 0.9 % injection 3 mL  3 mL Intravenous PRN Nishant Dhungel, MD   3 mL at 11/03/13 1013  . vitamin C (ASCORBIC ACID) tablet 1,000 mg  1,000 mg Oral Daily Nishant Dhungel, MD   1,000 mg at 11/04/13 1043    PE: General appearance: alert, cooperative and no distress Lungs: clear to auscultation bilaterally Heart: irregularly irregular rhythm Extremities: trace bilateral LEE Pulses: 2+ and symmetric Skin: warm and dry Neurologic: Grossly normal  Lab Results:   Recent Labs  11/04/13 0412  WBC 7.2  HGB 12.7  HCT 41.6  PLT 224   BMET  Recent Labs  11/03/13 0500 11/04/13 0412 11/05/13 0350  NA 141 139 139  K 3.9 3.9 4.0  CL 95* 94* 95*  CO2 38* 37* 37*  GLUCOSE 88 107* 125*  BUN 24* 29* 33*  CREATININE 0.87 0.70 0.89  CALCIUM 10.3 10.4 10.2    Assessment/Plan  Principal Problem:   Acute on chronic diastolic CHF (congestive heart failure) Active Problems:   PULMONARY SARCOIDOSIS   Obstructive sleep apnea   Peripheral edema   Respiratory failure with hypoxia   Anemia   Breast cancer, stage 1 s/p rt lumpectomy and radiation 2012   Diabetes mellitus   COPD (chronic obstructive pulmonary disease)   Chronic respiratory failure with hypoxia   Weakness generalized   Breast cancer   Bradycardia   Atrial fibrillation, persistent   Chronic anticoagulation, on Eliquis  Plan: Permanent Afib on telemetry. Resting HR controlled in the 80s. No further bradycardia on telemetry. Continue current dose of Diltiazem. BP stable. Continue on Xarelto for [facility]. MD to follow.     LOS: 5 days     Burlon Centrella M. Sharol Harness, PA-C 11/05/2013 10:16 AM

## 2013-11-05 NOTE — Progress Notes (Signed)
Physical Therapy Treatment Patient Details Name: EXIE CHRISMER MRN: [id number] DOB: 1941-05-16 Today's Date: 11/05/2013 Time: [phone] PT Time Calculation (min): 27 min  PT Assessment / Plan / Recommendation  History of Present Illness 72 year old female with history of COPD with chronic hypoxic respiratory failure on 2 L O2 via nasal cannula, OSA on CPAP, sarcoidosis, history of breast cancer status post lumpectomy and radiation therapy (follows with Dr. [name]), diabetes mellitus, anemia, persistent/permanent A. fib on Eliquis.  She was sent to the hospital from skilled nursing facility as she was found to be bradycardic in the 40s. Patient reports being short of breath on exertion progressively over the last 1 month. She does have baseline 2 pillow orthopnea. She reports dyspnea on minimal exertion but denies PND. She also has increased leg swelling for few weeks   PT Comments   Assisted pt OOB to amb to BR then amb in hallway.  Follow Up Recommendations  Home health PT;SNF  Pt wants to D[facility]C [facility] home [facility]   Does the patient have the potential to tolerate intense rehabilitation     Barriers to Discharge        Equipment Recommendations  None recommended by PT    Recommendations for Other Services    Frequency Min 3X/week   Progress towards PT Goals Progress towards PT goals: Progressing toward goals  Plan      Precautions / Restrictions Precautions Precautions: Fall Precaution Comments: home O2 Restrictions Weight Bearing Restrictions: No    Pertinent Vitals/Pain No c/o pain    Mobility  Bed Mobility Bed Mobility: Supine to Sit Supine to Sit: 5: Supervision Details for Bed Mobility Assistance: increased time Transfers Transfers: Sit to Stand;Stand to Sit Sit to Stand: 5: Supervision;4: Min guard;From toilet;From bed Stand to Sit: 5: Supervision;4: Min guard;To toilet;To chair/3-in-1 Details for Transfer Assistance: increased  time Ambulation/Gait Ambulation/Gait Assistance: 4: Min guard Ambulation Distance (Feet): 65 Feet Assistive device: Rolling walker Ambulation/Gait Assistance Details: increased time Gait Pattern: Step-through pattern;Decreased stride length Gait velocity: decreased    PT Goals (current goals can now be found in the care plan section)    Visit Information  Last PT Received On: 11/05/13 Assistance Needed: +1 History of Present Illness: 72 year old female with history of COPD with chronic hypoxic respiratory failure on 2 L O2 via nasal cannula, OSA on CPAP, sarcoidosis, history of breast cancer status post lumpectomy and radiation therapy (follows with Dr. [name]), diabetes mellitus, anemia, persistent/permanent A. fib on Eliquis.  She was sent to the hospital from skilled nursing facility as she was found to be bradycardic in the 40s. Patient reports being short of breath on exertion progressively over the last 1 month. She does have baseline 2 pillow orthopnea. She reports dyspnea on minimal exertion but denies PND. She also has increased leg swelling for few weeks    Subjective Data      Cognition       Balance     End of Session PT - End of Session Equipment Utilized During Treatment: Gait belt Activity Tolerance: Patient tolerated treatment well Patient left: in chair;with call bell/phone within reach   Felecia Shelling  PTA [facility]  Acute  Rehab Pager      [phone]

## 2013-11-05 NOTE — Progress Notes (Signed)
Pt. Seen and examined. Agree with the NP/PA-C note as written.  Breathing has improved. Continues to diurese. Out negative 13L.  HR is better controlled with diltiazem.  Would convert to oral lasix 40 mg po BID starting tonight.  Convert diltiazem to cardizem LA 240 mg daily starting tomorrow.  Close to discharge.  Chrystie Nose, MD, [facility] Attending Cardiologist [facility]

## 2013-11-06 DIAGNOSIS — N179 Acute kidney failure, unspecified: Secondary | ICD-10-CM

## 2013-11-06 LAB — BASIC METABOLIC PANEL
BUN: 31 mg/dL — ABNORMAL HIGH (ref 6–23)
CO2: 35 mEq/L — ABNORMAL HIGH (ref 19–32)
Calcium: 10 mg/dL (ref 8.4–10.5)
Chloride: 98 mEq/L (ref 96–112)
Creatinine, Ser: 0.81 mg/dL (ref 0.50–1.10)
GFR calc Af Amer: 81 mL/min — ABNORMAL LOW (ref >90)
GFR calc non Af Amer: 70 mL/min — ABNORMAL LOW (ref >90)
Glucose, Bld: 108 mg/dL — ABNORMAL HIGH (ref 70–99)
Potassium: 4 mEq/L (ref 3.5–5.1)
Sodium: 140 mEq/L (ref 135–145)

## 2013-11-06 LAB — GLUCOSE, CAPILLARY: Glucose-Capillary: 148 mg/dL — ABNORMAL HIGH (ref 70–99)

## 2013-11-06 NOTE — Progress Notes (Signed)
Placed pt. On cpap. Pt. Is tolerating well at this time.  

## 2013-11-06 NOTE — Progress Notes (Signed)
Physical Therapy Treatment Patient Details Name: [name] MRN: [id number] DOB: 1941/06/05 Today's Date: 11/06/2013 Time: 4098-1191 PT Time Calculation (min): 29 min  PT Assessment / Plan / Recommendation  History of Present Illness 72 year old female with history of COPD with chronic hypoxic respiratory failure on 2 L O2 via nasal cannula, OSA on CPAP, sarcoidosis, history of breast cancer status post lumpectomy and radiation therapy (follows with Dr. [name]), diabetes mellitus, anemia, persistent/permanent A. fib on Eliquis.  She was sent to the hospital from skilled nursing facility as she was found to be bradycardic in the 40s. Patient reports being short of breath on exertion progressively over the last 1 month. She does have baseline 2 pillow orthopnea. She reports dyspnea on minimal exertion but denies PND. She also has increased leg swelling for few weeks   PT Comments   Pt in recliner with son visiting.  Assisted to BR to void then amb in hallway several small distances with sitting rest breaks between.   Follow Up Recommendations  Home health PT;SNF (Pt wants to D/C to home)     Does the patient have the potential to tolerate intense rehabilitation     Barriers to Discharge        Equipment Recommendations  None recommended by PT    Recommendations for Other Services    Frequency Min 3X/week   Progress towards PT Goals Progress towards PT goals: Progressing toward goals  Plan      Precautions / Restrictions Precautions Precautions: Fall Precaution Comments: home O2 Restrictions Weight Bearing Restrictions: No    Pertinent Vitals/Pain C/o R knee pain "arthritis" meds requested    Mobility  Bed Mobility Bed Mobility: Not assessed Details for Bed Mobility Assistance: Pt OOB in recliner Transfers Transfers: Sit to Stand;Stand to Sit Sit to Stand: 5: Supervision;4: Min guard;From toilet;From chair/3-in-1 Stand to Sit: 5: Supervision;4: Min guard;To toilet;To  chair/3-in-1 Details for Transfer Assistance: increased time Ambulation/Gait Ambulation/Gait Assistance: 4: Min guard Ambulation Distance (Feet): 100 Feet (40', 35', 25' sitting rest breaks) Assistive device: Rolling walker Ambulation/Gait Assistance Details: increased time with limited amb distance and freq sitting rest breaks on 2 lts O2 avg sats 92%.   Gait Pattern: Step-through pattern;Decreased stride length Gait velocity: decreased     PT Goals (current goals can now be found in the care plan section)    Visit Information  Last PT Received On: 11/06/13 Assistance Needed: +1 History of Present Illness: 72 year old female with history of COPD with chronic hypoxic respiratory failure on 2 L O2 via nasal cannula, OSA on CPAP, sarcoidosis, history of breast cancer status post lumpectomy and radiation therapy (follows with Dr. [name]), diabetes mellitus, anemia, persistent/permanent A. fib on Eliquis.  She was sent to the hospital from skilled nursing facility as she was found to be bradycardic in the 40s. Patient reports being short of breath on exertion progressively over the last 1 month. She does have baseline 2 pillow orthopnea. She reports dyspnea on minimal exertion but denies PND. She also has increased leg swelling for few weeks    Subjective Data      Cognition       Balance     End of Session PT - End of Session Equipment Utilized During Treatment: Gait belt Activity Tolerance: Patient tolerated treatment well Patient left: in chair;with call bell/phone within reach   Felecia Shelling  PTA [facility]  Acute  Rehab Pager      [phone]

## 2013-11-06 NOTE — Progress Notes (Signed)
Subjective:  No chest pain; breathing better, but c/o cough  Objective:   Vital Signs in the last 24 hours: Temp:  [97.4 F (36.3 C)-97.9 F (36.6 C)] 97.9 F (36.6 C) (11/26 1315) Pulse Rate:  [74-95] 83 (11/26 1315) Resp:  [16-18] 18 (11/26 1315) BP: (107-123)/(45-60) 107/45 mmHg (11/26 1315) SpO2:  [95 %-100 %] 99 % (11/26 1315) Weight:  [244 lb 11.4 oz (111 kg)] 244 lb 11.4 oz (111 kg) (11/26 0516)  Intake/Output from previous day: 11/25 0701 - 11/26 0700 In: 560 [P.O.:560] Out: 1050 [Urine:1050]  Medications: . allopurinol  200 mg Oral Daily  . apixaban  5 mg Oral BID  . atorvastatin  20 mg Oral QHS  . benzonatate  200 mg Oral TID  . budesonide-formoterol  2 puff Inhalation BID  . colchicine  0.6 mg Oral BID  . diltiazem  240 mg Oral Daily  . exemestane  25 mg Oral QPC breakfast  . ferrous fumarate  1 tablet Oral BID  . furosemide  40 mg Oral BID  . guaiFENesin  1,200 mg Oral BID  . insulin aspart  0-15 Units Subcutaneous TID WC  . insulin glargine  28 Units Subcutaneous QHS  . ipratropium  0.5 mg Nebulization TID  . levalbuterol  0.63 mg Nebulization TID  . lisinopril  20 mg Oral Daily  . loratadine  10 mg Oral Daily  . LORazepam  0.5 mg Oral QHS  . multivitamin with minerals  1 tablet Oral Daily  . polyethylene glycol  17 g Oral Daily  . potassium chloride  20 mEq Oral Daily  . sodium chloride  3 mL Intravenous Q12H  . vitamin C  1,000 mg Oral Daily       Physical Exam:   General appearance: alert, cooperative and no distress Neck: no adenopathy, no JVD and supple, symmetrical, trachea midline Lungs: slightly decreased BS at bases; no rales or wheezes Heart: irregularly irregular rhythm and 1/6 sem Abdomen: soft, non-tender; bowel sounds normal; no masses,  no organomegaly Extremities: no edema, redness or tenderness in the calves or thighs; fungal infection of toenails   Rate: 60's  Rhythm: atrial fibrillation  Lab Results:    Recent Labs  11/05/13 0350 11/06/13 0500  NA 139 140  K 4.0 4.0  CL 95* 98  CO2 37* 35*  GLUCOSE 125* 108*  BUN 33* 31*  CREATININE 0.89 0.81   No results found for this basename: TROPONINI, CK, MB,  in the last 72 hours Hepatic Function Panel No results found for this basename: PROT, ALBUMIN, AST, ALT, ALKPHOS, BILITOT, BILIDIR, IBILI,  in the last 72 hours No results found for this basename: INR,  in the last 72 hours BNP (last 3 results)  Recent Labs  05/11/13 0450 10/31/13 1514 10/31/13 2210  PROBNP 1374.0* 305.0* 2189.0*    Lipid Panel  No results found for this basename: chol, trig, hdl, cholhdl, vldl, ldlcalc      Imaging:  No results found.    Assessment/Plan:   Principal Problem:   Acute on chronic diastolic CHF (congestive heart failure) Active Problems:   PULMONARY SARCOIDOSIS   Obstructive sleep apnea   Peripheral edema   Respiratory failure with hypoxia   Anemia   Breast cancer, stage 1 s/p rt lumpectomy and radiation 2012   Diabetes mellitus   COPD (chronic obstructive pulmonary disease)   Chronic respiratory failure with hypoxia   Weakness generalized   Breast cancer   Bradycardia   Atrial fibrillation, persistent  Chronic anticoagulation, on Eliquis  AF with rate control now on oral therapy. Will f/u BNP in am. I/O -13,820 since admission; wt 277 to 244 lbs. Renal fxn stable. No bleeding on eliquis.    Lennette Bihari, MD, [facility] 11/06/2013, 2:39 PM

## 2013-11-06 NOTE — Progress Notes (Signed)
Patient ID: [name], female   DOB: 08/17/1941, 72 y.o.   MRN: [id number] [facility] PROGRESS NOTE  ASENETH HACK [name]:[id number] DOB: [birth year] DOA: 10/31/2013 PCP: [name], MD  Brief narrative: 72 year old morbidly obese female with history of COPD with chronic hypoxic respiratory failure on 2 L O2 via nasal cannula, OSA on CPAP, sarcoidosis, history of breast cancer status post lumpectomy and radiation therapy (follows with Dr. [name]), diabetes mellitus, anemia, A. fib on Eliquis, was sent to the hospital from skilled nursing facility as she was found to be bradycardic with HR in the 40s. Per pt, this has been associated with dyspnea on exertion and at rest, 2-3 pillow orthopnea, progressively worsening LE swelling. Her pulmonologist increased the dose of Lasix to 40 mg BID but she has not started this piror to this admission.  Principal Problem:   Acute on chronic diastolic CHF (congestive heart failure) - clinically improving and maintaining oxygen saturations at target range on [place] - CE's x 3 sets negative - TSH within normal limits of 4.065. 2-D echo with EF of 55-60%.  - weight trend since admission 259 lbs --> 248 --> 244 lbs this AM 11/26 - appreciate cardiology input  - continue to monitor daily weights, strict I's and O's Active Problems:   PULMONARY SARCOIDOSIS - outpatient follow up with her pulmonologist    Obstructive sleep apnea - continue to provide CPAP   Bradycardia - improved now off Metoprolol - HR in 70's and no events on telemetry - CE x 3 sets negative    Respiratory failure with hypoxia - most likely secondary to diastolic CHF - now clinically improved with stable oxygen saturations on [place] 2 L oxygen    Anemia of chronic disease - Hg/Hct stable over the past 48 hours   Breast cancer, stage 1 s/p rt lumpectomy and radiation 2012 - follows with Dr. [name]  - on Aromasin    Diabetes mellitus - reasonable inpatient control  - continue Lantus  and SSI   COPD (chronic obstructive pulmonary disease) - clinically stable this AM - continue supportive care and oxygen    Atrial fibrillation, persistent - on chronic anticoagulation, on Eliquis - diltiazem for rate control    Moderate malnutrition - secondary to advancing COPD, CHF - nutritional supplementation   Code Status: Full  Family Communication: Updated patient. Disposition Plan: Home versus SNF when medically stable.   Consultants:  Cardiology: Dr. Allyson Sabal 11/01/2013 Procedures:  2-D echo 11/01/2013  Chest x-ray 10/31/2013 Antibiotics:  None   HPI/Subjective: No events overnight.   Objective: Filed Vitals:   11/06/13 0023 11/06/13 0503 11/06/13 0516 11/06/13 0830  BP:  117/58    Pulse: 74 79    Temp:  97.4 F (36.3 C)    TempSrc:  Oral    Resp: 16 16    Height:      Weight:   111 kg (244 lb 11.4 oz)   SpO2: 98% 97%  100%    Intake/Output Summary (Last 24 hours) at 11/06/13 1134 Last data filed at 11/06/13 1000  Gross per 24 hour  Intake    770 ml  Output    850 ml  Net    -80 ml    Exam:   General:  Pt is alert, follows commands appropriately, not in acute distress  Cardiovascular: Regular rate and rhythm, S1/S2, no murmurs, no rubs, no gallops  Respiratory: Clear to auscultation bilaterally, no wheezing, no crackles, no rhonchi  Abdomen: Soft, non tender, non  distended, bowel sounds present, no guarding  Extremities: Trace bilateral LE pitting edema, pulses DP and PT palpable bilaterally  Neuro: Grossly nonfocal  Data Reviewed: Basic Metabolic Panel:  Recent Labs Lab 11/02/13 0440 11/03/13 0500 11/04/13 0412 11/05/13 0350 11/06/13 0500  NA 141 141 139 139 140  K 3.7 3.9 3.9 4.0 4.0  CL 96 95* 94* 95* 98  CO2 38* 38* 37* 37* 35*  GLUCOSE 83 88 107* 125* 108*  BUN 20 24* 29* 33* 31*  CREATININE 0.95 0.87 0.70 0.89 0.81  CALCIUM 9.9 10.3 10.4 10.2 10.0   Liver Function Tests:  Recent Labs Lab 10/31/13 2210  AST 18  ALT  10  ALKPHOS 91  BILITOT 0.6  PROT 7.1  ALBUMIN 3.9   CBC:  Recent Labs Lab 10/31/13 1514 10/31/13 2210 11/01/13 0755 11/02/13 0440 11/04/13 0412  WBC 7.9 7.1 5.9 6.6 7.2  NEUTROABS 5.7 4.7  --   --   --   HGB 11.3* 11.2* 10.6* 11.4* 12.7  HCT 34.5* 36.0 34.3* 37.0 41.6  MCV 76.7* 80.2 79.8 81.0 80.3  PLT 228.0 194 163 171 224   Cardiac Enzymes:  Recent Labs Lab 11/01/13 1216 11/01/13 1626 11/01/13 2255  TROPONINI <0.30 <0.30 <0.30    CBG:  Recent Labs Lab 11/05/13 0734 11/05/13 1134 11/05/13 1642 11/05/13 2031 11/06/13 0725  GLUCAP 115* 164* 187* 185* 94    Recent Results (from the past 240 hour(s))  MRSA PCR SCREENING     Status: None   Collection Time    11/01/13  3:35 AM      Result Value Range Status   MRSA by PCR NEGATIVE  NEGATIVE Final   Comment:            The GeneXpert MRSA Assay (FDA     approved for NASAL specimens     only), is one component of a     comprehensive MRSA colonization     surveillance program. It is not     intended to diagnose MRSA     infection nor to guide or     monitor treatment for     MRSA infections.     Scheduled Meds: . allopurinol  200 mg Oral Daily  . apixaban  5 mg Oral BID  . atorvastatin  20 mg Oral QHS  . benzonatate  200 mg Oral TID  . budesonide-formoterol  2 puff Inhalation BID  . colchicine  0.6 mg Oral BID  . diltiazem  240 mg Oral Daily  . exemestane  25 mg Oral QPC breakfast  . ferrous fumarate  1 tablet Oral BID  . furosemide  40 mg Oral BID  . guaiFENesin  1,200 mg Oral BID  . insulin aspart  0-15 Units Subcutaneous TID WC  . insulin glargine  28 Units Subcutaneous QHS  . ipratropium  0.5 mg Nebulization TID  . levalbuterol  0.63 mg Nebulization TID  . lisinopril  20 mg Oral Daily  . loratadine  10 mg Oral Daily  . LORazepam  0.5 mg Oral QHS  . multivitamin with minerals  1 tablet Oral Daily  . polyethylene glycol  17 g Oral Daily  . potassium chloride  20 mEq Oral Daily  . sodium  chloride  3 mL Intravenous Q12H  . vitamin C  1,000 mg Oral Daily   Continuous Infusions:    Debbora Presto, MD  [facility] Pager [phone]  If 7PM-7AM, please contact night-coverage [email or web address] Password [email or web address] 11/06/2013, 11:34 AM   LOS:  6 days

## 2013-11-07 LAB — GLUCOSE, CAPILLARY: Glucose-Capillary: 157 mg/dL — ABNORMAL HIGH (ref 70–99)

## 2013-11-07 LAB — CBC
Hemoglobin: 12.4 g/dL (ref 12.0–15.0)
MCH: 24.5 pg — ABNORMAL LOW (ref 26.0–34.0)
MCV: 80.8 fL (ref 78.0–100.0)
Platelets: 215 10*3/uL (ref 150–400)
RBC: 5.06 MIL/uL (ref 3.87–5.11)

## 2013-11-07 LAB — BASIC METABOLIC PANEL
BUN: 29 mg/dL — ABNORMAL HIGH (ref 6–23)
CO2: 34 mEq/L — ABNORMAL HIGH (ref 19–32)
Calcium: 10.3 mg/dL (ref 8.4–10.5)
Chloride: 96 mEq/L (ref 96–112)
Creatinine, Ser: 0.79 mg/dL (ref 0.50–1.10)
Glucose, Bld: 125 mg/dL — ABNORMAL HIGH (ref 70–99)

## 2013-11-07 MED ORDER — DILTIAZEM HCL ER COATED BEADS 240 MG PO CP24: 240.0000 mg | ORAL_CAPSULE | Freq: Every day | ORAL | Status: DC

## 2013-11-07 MED ORDER — HYDROCOD POLST-CHLORPHEN POLST 10-8 MG/5ML PO LQCR: 5.0000 mL | Freq: Two times a day (BID) | ORAL | Status: DC | PRN

## 2013-11-07 MED ORDER — IRBESARTAN 150 MG PO TABS
150.0000 mg | ORAL_TABLET | Freq: Every day | ORAL | Status: DC
  Administered 2013-11-07: 11:00:00 150 mg via ORAL
  Filled 2013-11-07 (×2): qty 1

## 2013-11-07 MED ORDER — FUROSEMIDE 40 MG PO TABS: 40.0000 mg | ORAL_TABLET | Freq: Two times a day (BID) | ORAL | Status: DC

## 2013-11-07 MED ORDER — HYDROCODONE-ACETAMINOPHEN 5-325 MG PO TABS: 1.0000 | ORAL_TABLET | Freq: Four times a day (QID) | ORAL | Status: DC | PRN

## 2013-11-07 MED ORDER — POTASSIUM CHLORIDE CRYS ER 20 MEQ PO TBCR: 20.0000 meq | EXTENDED_RELEASE_TABLET | Freq: Every day | ORAL | Status: DC

## 2013-11-07 MED ORDER — LORAZEPAM 0.5 MG PO TABS: ORAL_TABLET | ORAL | Status: DC

## 2013-11-07 MED ORDER — IRBESARTAN 150 MG PO TABS: 150.0000 mg | ORAL_TABLET | Freq: Every day | ORAL | Status: DC

## 2013-11-07 NOTE — Progress Notes (Signed)
Multiple attempts to call report to facility made. No answer is going to a voice mail. 2 messages were left.  Awaiting arrival of [facility].

## 2013-11-07 NOTE — Progress Notes (Signed)
Spoke with SW and Buyer, retail at [facility]. They will take patient back today. Pt is receptive to this plan of care. Faxed FL2 and discharge summary over to facility. Will await fall back from facility prior to transport. Will touch basis with SW to assure that all requirements have been met.

## 2013-11-07 NOTE — Progress Notes (Signed)
Subjective:  She feels she is getting better, but could not sleep all night due to a dry hacking cough. Good heart rate control. Remains in negative fluid balance.  Objective:  Temp:  [97.5 F (36.4 C)-97.9 F (36.6 C)] 97.5 F (36.4 C) (11/27 0629) Pulse Rate:  [79-107] 107 (11/27 0629) Resp:  [18-20] 20 (11/27 0629) BP: (104-131)/(45-58) 131/56 mmHg (11/27 0629) SpO2:  [94 %-99 %] 94 % (11/27 0629) Weight:  [244 lb 7.8 oz (110.9 kg)] 244 lb 7.8 oz (110.9 kg) (11/27 0629) Weight change: -3.5 oz (-0.1 kg)  Intake/Output from previous day: 11/26 0701 - 11/27 0700 In: 710 [P.O.:710] Out: 2250 [Urine:2250]  Intake/Output from this shift:    Medications: Current Facility-Administered Medications  Medication Dose Route Frequency Provider Last Rate Last Dose  . 0.9 %  sodium chloride infusion  250 mL Intravenous PRN Nishant Dhungel, MD      . acetaminophen (TYLENOL) tablet 1,000 mg  1,000 mg Oral Q6H PRN Nishant Dhungel, MD      . acetaminophen (TYLENOL) tablet 650 mg  650 mg Oral Q4H PRN Nishant Dhungel, MD      . albuterol (PROVENTIL HFA;VENTOLIN HFA) 108 (90 BASE) MCG/ACT inhaler 2 puff  2 puff Inhalation Q6H PRN Nishant Dhungel, MD      . allopurinol (ZYLOPRIM) tablet 200 mg  200 mg Oral Daily Nishant Dhungel, MD   200 mg at 11/06/13 0959  . apixaban (ELIQUIS) tablet 5 mg  5 mg Oral BID Nishant Dhungel, MD   5 mg at 11/06/13 2008  . atorvastatin (LIPITOR) tablet 20 mg  20 mg Oral QHS Tad Moore [place], RPH   20 mg at 11/06/13 2145  . benzonatate (TESSALON) capsule 200 mg  200 mg Oral TID Rodolph Bong, MD   200 mg at 11/06/13 2145  . budesonide-formoterol (SYMBICORT) 160-4.5 MCG/ACT inhaler 2 puff  2 puff Inhalation BID Nishant Dhungel, MD   2 puff at 11/07/13 0752  . chlorpheniramine-HYDROcodone (TUSSIONEX) 10-8 MG/5ML suspension 5 mL  5 mL Oral Q12H PRN Nishant Dhungel, MD   5 mL at 11/07/13 0030  . colchicine tablet 0.6 mg  0.6 mg Oral BID Nishant Dhungel, MD   0.6  mg at 11/06/13 2145  . diltiazem (CARDIZEM CD) 24 hr capsule 240 mg  240 mg Oral Daily Chrystie Nose, MD   240 mg at 11/06/13 0959  . exemestane (AROMASIN) tablet 25 mg  25 mg Oral QPC breakfast Nishant Dhungel, MD   25 mg at 11/06/13 1000  . ferrous fumarate (HEMOCYTE - 106 mg FE) tablet 106 mg of iron  1 tablet Oral BID Nishant Dhungel, MD   106 mg of iron at 11/06/13 2145  . furosemide (LASIX) tablet 40 mg  40 mg Oral BID Chrystie Nose, MD   40 mg at 11/06/13 1720  . guaiFENesin (MUCINEX) 12 hr tablet 1,200 mg  1,200 mg Oral BID Nishant Dhungel, MD   1,200 mg at 11/06/13 2145  . HYDROcodone-acetaminophen (NORCO/VICODIN) 5-325 MG per tablet 1 tablet  1 tablet Oral Q6H PRN Nishant Dhungel, MD   1 tablet at 11/06/13 1615  . insulin aspart (novoLOG) injection 0-15 Units  0-15 Units Subcutaneous TID WC Nishant Dhungel, MD   2 Units at 11/06/13 1721  . insulin glargine (LANTUS) injection 28 Units  28 Units Subcutaneous QHS Nishant Dhungel, MD   28 Units at 11/06/13 2246  . ipratropium (ATROVENT) nebulizer solution 0.5 mg  0.5 mg Nebulization TID Rodolph Bong,  MD   0.5 mg at 11/07/13 0753  . irbesartan (AVAPRO) tablet 150 mg  150 mg Oral Daily Jazziel Fitzsimmons, MD      . levalbuterol (XOPENEX) nebulizer solution 0.63 mg  0.63 mg Nebulization Q4H PRN Renae Fickle, MD      . levalbuterol Pauline Aus) nebulizer solution 0.63 mg  0.63 mg Nebulization TID Rodolph Bong, MD   0.63 mg at 11/07/13 0753  . loratadine (CLARITIN) tablet 10 mg  10 mg Oral Daily Nishant Dhungel, MD   10 mg at 11/06/13 0959  . LORazepam (ATIVAN) tablet 0.5 mg  0.5 mg Oral QHS Nishant Dhungel, MD   0.5 mg at 11/06/13 2145  . meclizine (ANTIVERT) tablet 6.25-12.5 mg  6.25-12.5 mg Oral TID PRN Nishant Dhungel, MD      . multivitamin with minerals tablet 1 tablet  1 tablet Oral Daily Nishant Dhungel, MD   1 tablet at 11/06/13 0959  . ondansetron (ZOFRAN) injection 4 mg  4 mg Intravenous Q6H PRN Nishant Dhungel, MD      .  ondansetron (ZOFRAN) tablet 4 mg  4 mg Oral Q8H PRN Nishant Dhungel, MD      . polyethylene glycol (MIRALAX / GLYCOLAX) packet 17 g  17 g Oral Daily Nishant Dhungel, MD   17 g at 11/06/13 0958  . potassium chloride SA (K-DUR,KLOR-CON) CR tablet 20 mEq  20 mEq Oral Daily Nishant Dhungel, MD   20 mEq at 11/06/13 0959  . sodium chloride 0.9 % injection 3 mL  3 mL Intravenous Q12H Nishant Dhungel, MD   3 mL at 11/06/13 2246  . sodium chloride 0.9 % injection 3 mL  3 mL Intravenous PRN Nishant Dhungel, MD   3 mL at 11/03/13 1013  . vitamin C (ASCORBIC ACID) tablet 1,000 mg  1,000 mg Oral Daily Nishant Dhungel, MD   1,000 mg at 11/06/13 1000    Physical Exam: Morbidly obese General appearance: alert, cooperative and no distress Neck: no adenopathy, no carotid bruit, no JVD, supple, symmetrical, trachea midline and thyroid not enlarged, symmetric, no tenderness/mass/nodules Lungs: clear to auscultation bilaterally Heart: irregularly irregular rhythm and S1, S2 normal Abdomen: soft, non-tender; bowel sounds normal; no masses,  no organomegaly Extremities: extremities normal, atraumatic, no cyanosis or edema Neurologic: Alert and oriented X 3, normal strength and tone. Normal symmetric reflexes. Normal coordination and gait  Lab Results: Results for orders placed during the hospital encounter of 10/31/13 (from the past 48 hour(s))  GLUCOSE, CAPILLARY     Status: Abnormal   Collection Time    11/05/13 11:34 AM      Result Value Range   Glucose-Capillary 164 (*) 70 - 99 mg/dL  GLUCOSE, CAPILLARY     Status: Abnormal   Collection Time    11/05/13  4:42 PM      Result Value Range   Glucose-Capillary 187 (*) 70 - 99 mg/dL  GLUCOSE, CAPILLARY     Status: Abnormal   Collection Time    11/05/13  8:31 PM      Result Value Range   Glucose-Capillary 185 (*) 70 - 99 mg/dL  BASIC METABOLIC PANEL     Status: Abnormal   Collection Time    11/06/13  5:00 AM      Result Value Range   Sodium 140  135 -  145 mEq/L   Potassium 4.0  3.5 - 5.1 mEq/L   Chloride 98  96 - 112 mEq/L   CO2 35 (*) 19 - 32 mEq/L  Glucose, Bld 108 (*) 70 - 99 mg/dL   BUN 31 (*) 6 - 23 mg/dL   Creatinine, Ser 0.98  0.50 - 1.10 mg/dL   Calcium 11.9  8.4 - 14.7 mg/dL   GFR calc non Af Amer 70 (*) >90 mL/min   GFR calc Af Amer 81 (*) >90 mL/min   Comment: (NOTE)     The eGFR has been calculated using the CKD EPI equation.     This calculation has not been validated in all clinical situations.     eGFR's persistently <90 mL/min signify possible Chronic Kidney     Disease.  GLUCOSE, CAPILLARY     Status: None   Collection Time    11/06/13  7:25 AM      Result Value Range   Glucose-Capillary 94  70 - 99 mg/dL  GLUCOSE, CAPILLARY     Status: Abnormal   Collection Time    11/06/13 11:56 AM      Result Value Range   Glucose-Capillary 148 (*) 70 - 99 mg/dL  GLUCOSE, CAPILLARY     Status: Abnormal   Collection Time    11/06/13  5:15 PM      Result Value Range   Glucose-Capillary 178 (*) 70 - 99 mg/dL  GLUCOSE, CAPILLARY     Status: Abnormal   Collection Time    11/06/13  9:48 PM      Result Value Range   Glucose-Capillary 139 (*) 70 - 99 mg/dL  CBC     Status: Abnormal   Collection Time    11/07/13  4:25 AM      Result Value Range   WBC 6.5  4.0 - 10.5 K/uL   RBC 5.06  3.87 - 5.11 MIL/uL   Hemoglobin 12.4  12.0 - 15.0 g/dL   HCT 82.9  56.2 - 13.0 %   MCV 80.8  78.0 - 100.0 fL   MCH 24.5 (*) 26.0 - 34.0 pg   MCHC 30.3  30.0 - 36.0 g/dL   RDW 86.5 (*) 78.4 - 69.6 %   Platelets 215  150 - 400 K/uL  BASIC METABOLIC PANEL     Status: Abnormal   Collection Time    11/07/13  4:25 AM      Result Value Range   Sodium 138  135 - 145 mEq/L   Potassium 4.2  3.5 - 5.1 mEq/L   Chloride 96  96 - 112 mEq/L   CO2 34 (*) 19 - 32 mEq/L   Glucose, Bld 125 (*) 70 - 99 mg/dL   BUN 29 (*) 6 - 23 mg/dL   Creatinine, Ser 2.95  0.50 - 1.10 mg/dL   Calcium 28.4  8.4 - 13.2 mg/dL   GFR calc non Af Amer 80 (*) >90 mL/min    GFR calc Af Amer >90  >90 mL/min   Comment: (NOTE)     The eGFR has been calculated using the CKD EPI equation.     This calculation has not been validated in all clinical situations.     eGFR's persistently <90 mL/min signify possible Chronic Kidney     Disease.  PRO B NATRIURETIC PEPTIDE     Status: Abnormal   Collection Time    11/07/13  4:25 AM      Result Value Range   Pro B Natriuretic peptide (BNP) 802.3 (*) 0 - 125 pg/mL  GLUCOSE, CAPILLARY     Status: None   Collection Time    11/07/13  7:53 AM  Result Value Range   Glucose-Capillary 96  70 - 99 mg/dL    Imaging: Imaging results have been reviewed and No results found.  Assessment:  1. Principal Problem: 2.   Acute on chronic diastolic CHF (congestive heart failure) 3. Active Problems: 4.   PULMONARY SARCOIDOSIS 5.   Obstructive sleep apnea 6.   Peripheral edema 7.   Respiratory failure with hypoxia 8.   Anemia 9.   Breast cancer, stage 1 s/p rt lumpectomy and radiation 2012 10.   Diabetes mellitus 11.   COPD (chronic obstructive pulmonary disease) 12.   Chronic respiratory failure with hypoxia 13.   Weakness generalized 14.   Breast cancer 15.   Bradycardia 16.   Atrial fibrillation, persistent 17.   Chronic anticoagulation, on Eliquis 18.   Plan:  1. Marked improvement in hypervolemia. 2. Good ventricular arte control on current dose of diltiazem 3. Suspect cough is ACEi-related. Will stop lisinopril and use and ARB instead. 4. It is very important that she be weighed daily, whether she goes to a nursing facility or home, to avoid repeat hospitalization from CHF  Time Spent Directly with Patient:  35 minutes  Length of Stay:  LOS: 7 days    Joyanna Kleman 11/07/2013, 8:36 AM

## 2013-11-07 NOTE — Progress Notes (Signed)
Placed pt on cpap. Cpap was set at [facility] per home regimen and sterile water was added for humidification. Pt tolertaing well at this time. RT will continue to monitor.

## 2013-11-07 NOTE — Discharge Summary (Signed)
Physician Discharge Summary  [name] ZOX:[id number] DOB: 11/28/1941 DOA: 10/31/2013  PCP: [name], MD  Admit date: 10/31/2013 Discharge date: 11/07/2013  Recommendations for Outpatient Follow-up:  1. Pt will need to follow up with PCP in 2-3 weeks post discharge 2. Please obtain BMP to evaluate electrolytes and kidney function 3. Please also check CBC to evaluate Hg and Hct levels 4. Please note that metoprolol was stopped due to bradycardia and pt was continued on Cardizem for rate control  5. Please note that Cardizem dose has changed from 360 mg to 240 mg per cardiology recommendations 6. Please also note that ACEI were stopped due to cough and pt was started on Avapro (ARB) instead 7. Pt needs to see cardiologist in next 2-3 weeks for regular follow up 8. Please also note that weight has to be checked daily to ensure euvolemic state 9. Baseline weight upon discharge is 244 lbs  Discharge Diagnoses: Acute on chronic diastolic CHF exacerbation  Principal Problem:   Acute on chronic diastolic CHF (congestive heart failure) Active Problems:   PULMONARY SARCOIDOSIS   Obstructive sleep apnea   Peripheral edema   Respiratory failure with hypoxia   Anemia   Breast cancer, stage 1 s/p rt lumpectomy and radiation 2012   Diabetes mellitus   COPD (chronic obstructive pulmonary disease)   Chronic respiratory failure with hypoxia   Weakness generalized   Breast cancer   Bradycardia   Atrial fibrillation, persistent   Chronic anticoagulation, on Eliquis   Discharge Condition: Stable  Diet recommendation: Heart healthy diet discussed in details   History of present illness:  72 year old morbidly obese female with history of COPD with chronic hypoxic respiratory failure on 2 L O2 via nasal cannula, OSA on CPAP, sarcoidosis, history of breast cancer status post lumpectomy and radiation therapy (follows with Dr. [name]), diabetes mellitus, anemia, A. fib on Eliquis, was  sent to the hospital from skilled nursing facility as she was found to be bradycardic with HR in the 40s. Per pt, this has been associated with dyspnea on exertion and at rest, 2-3 pillow orthopnea, progressively worsening LE swelling. Her pulmonologist increased the dose of Lasix to 40 mg BID but she has not started this piror to this admission.   Principal Problem:  Acute on chronic diastolic CHF (congestive heart failure)  - clinically improving and maintaining oxygen saturations at target range on [place]  - CE's x 3 sets negative  - TSH within normal limits of 4.065. 2-D echo with EF of 55-60%.  - weight trend since admission 259 lbs --> 248 --> 244 lbs this AM 11/27 - pt is now euvolemic - appreciate cardiology input  - continue to monitor daily weights at SNF and reports any increase over 3 lbs to attending physician  Active Problems:  PULMONARY SARCOIDOSIS  - outpatient follow up with her pulmonologist  Obstructive sleep apnea  - continue to provide CPAP  Bradycardia  - improved now off Metoprolol  - HR in 70's and no events on telemetry  - CE x 3 sets negative  Respiratory failure with hypoxia  - most likely secondary to diastolic CHF  - now clinically improved with stable oxygen saturations on [place] 2 L oxygen  Anemia of chronic disease  - Hg/Hct stable over the past 48 hours  Breast cancer, stage 1 s/p rt lumpectomy and radiation 2012  - follows with Dr. [name]  - on Aromasin  Diabetes mellitus  - reasonable inpatient control  - continue Lantus  -  SSI also provided inpatient  COPD (chronic obstructive pulmonary disease)  - clinically stable this AM  - continue supportive care and oxygen  Atrial fibrillation, persistent  - on chronic anticoagulation, on Eliquis  - diltiazem for rate control  Moderate malnutrition  - secondary to advancing COPD, CHF  - nutritional supplementation   Code Status: Full  Family Communication: Updated patient.  Disposition Plan: Home versus SNF  when bed available   Consultants:  Cardiology: Dr. Allyson Sabal 11/01/2013 Procedures:  2-D echo 11/01/2013  Chest x-ray 10/31/2013 Antibiotics:   None  Discharge Exam: Filed Vitals:   11/07/13 0629  BP: 131/56  Pulse: 107  Temp: 97.5 F (36.4 C)  Resp: 20   Filed Vitals:   11/06/13 1315 11/06/13 1950 11/06/13 2200 11/07/13 0629  BP: 107/45  104/58 131/56  Pulse: 83  79 107  Temp: 97.9 F (36.6 C)  97.7 F (36.5 C) 97.5 F (36.4 C)  TempSrc: Oral  Oral Oral  Resp: 18  18 20   Height:      Weight:    110.9 kg (244 lb 7.8 oz)  SpO2: 99% 97% 98% 94%    General: Pt is alert, follows commands appropriately, not in acute distress Cardiovascular: Irregular rate and rhythm,  no rubs, no gallops Respiratory: Clear to auscultation bilaterally, no wheezing, no crackles, no rhonchi Abdominal: Soft, non tender, non distended, bowel sounds +, no guarding Extremities: no edema, no cyanosis, pulses palpable bilaterally DP and PT Neuro: Grossly nonfocal  Discharge Instructions  Discharge Orders   Future Appointments Provider Department Dept Phone   01/31/2014 2:15 PM Waymon Budge, MD [facility] [phone]   03/10/2014 1:45 PM Mauri [place] [facility] [phone]   03/10/2014 2:15 PM Victorino December, MD [place] [phone]   Future Orders Complete By Expires   Diet - low sodium heart healthy  As directed    Increase activity slowly  As directed        Medication List    STOP taking these medications       diltiazem 360 MG 24 hr capsule  Commonly known as:  TIAZAC     metoprolol tartrate 25 MG tablet  Commonly known as:  LOPRESSOR      TAKE these medications       acetaminophen 500 MG tablet  Commonly known as:  TYLENOL  Take 1,000 mg by mouth every 6 (six) hours as needed. pain     allopurinol 100 MG tablet  Commonly known as:  ZYLOPRIM  Take 200 mg by mouth daily.     apixaban 5 MG  Tabs tablet  Commonly known as:  ELIQUIS  Take 1 tablet (5 mg total) by mouth 2 (two) times daily.     budesonide-formoterol 160-4.5 MCG/ACT inhaler  Commonly known as:  SYMBICORT  Inhale 2 puffs into the lungs 2 (two) times daily.     chlorpheniramine-HYDROcodone 10-8 MG/5ML Lqcr  Commonly known as:  TUSSIONEX  Take 5 mLs by mouth every 12 (twelve) hours as needed.     colchicine 0.6 MG tablet  Take 1 tablet (0.6 mg total) by mouth 2 (two) times daily.     diltiazem 240 MG 24 hr capsule  Commonly known as:  CARDIZEM CD  Take 1 capsule (240 mg total) by mouth daily.     exemestane 25 MG tablet  Commonly known as:  AROMASIN  Take 1 tablet (25 mg total) by mouth daily after  breakfast.     ferrous fumarate 325 (106 FE) MG Tabs tablet  Commonly known as:  HEMOCYTE - 106 mg FE  Take 1 tablet by mouth 2 (two) times daily.     fexofenadine 180 MG tablet  Commonly known as:  ALLEGRA  Take 180 mg by mouth daily.     furosemide 40 MG tablet  Commonly known as:  LASIX  Take 1 tablet (40 mg total) by mouth 2 (two) times daily.     guaiFENesin 600 MG 12 hr tablet  Commonly known as:  MUCINEX  Take 1,200 mg by mouth 2 (two) times daily.     HUMULIN N 100 UNIT/ML injection  Generic drug:  insulin NPH  Inject 5 Units into the skin as needed (5 units if blood sugar is greater than 150).     HYDROcodone-acetaminophen 5-325 MG per tablet  Commonly known as:  NORCO/VICODIN  Take 1 tablet by mouth every 6 (six) hours as needed. For pain.     insulin glargine 100 UNIT/ML injection  Commonly known as:  LANTUS  Inject 0.28 mLs (28 Units total) into the skin at bedtime.     ipratropium 0.02 % nebulizer solution  Commonly known as:  ATROVENT  Take 2.5 mLs (0.5 mg total) by nebulization every 6 (six) hours.     irbesartan 150 MG tablet  Commonly known as:  AVAPRO  Take 1 tablet (150 mg total) by mouth daily.     levalbuterol 0.63 MG/3ML nebulizer solution  Commonly known as:  XOPENEX   Take 3 mLs (0.63 mg total) by nebulization every 6 (six) hours.     LORazepam 0.5 MG tablet  Commonly known as:  ATIVAN  Take one tablet by mouth every night at bedtime as needed for anxiety     meclizine 25 MG tablet  Commonly known as:  ANTIVERT  Take 6.25-12.5 mg by mouth 3 (three) times daily as needed. For dizziness.     metFORMIN 500 MG tablet  Commonly known as:  GLUCOPHAGE  Take 500 mg by mouth 2 (two) times daily with a meal. If cbg is greater than 120 pt takes medication     multivitamin with minerals Tabs tablet  Take 1 tablet by mouth daily.     ondansetron 4 MG tablet  Commonly known as:  ZOFRAN  Take 4 mg by mouth every 8 (eight) hours as needed for nausea.     polyethylene glycol packet  Commonly known as:  MIRALAX / GLYCOLAX  Take 17 g by mouth daily.     potassium chloride SA 20 MEQ tablet  Commonly known as:  K-DUR,KLOR-CON  Take 1 tablet (20 mEq total) by mouth daily.     PROVENTIL HFA 108 (90 BASE) MCG/ACT inhaler  Generic drug:  albuterol  Inhale 2 puffs into the lungs every 6 (six) hours as needed for wheezing.     simvastatin 40 MG tablet  Commonly known as:  ZOCOR  Take 40 mg by mouth at bedtime.     vitamin C 1000 MG tablet  Take 1,000 mg by mouth daily.           Follow-up Information   Follow up with [name], MD In 2 weeks.   Specialty:  Family Medicine   Contact information:   [address] [place] [address] [phone]       Follow up with Thurmon Fair, MD In 2 weeks.   Specialty:  Cardiology   Contact information:  [address] [place] [address] [phone]        The results of significant diagnostics from this hospitalization (including imaging, microbiology, ancillary and laboratory) are listed below for reference.     Microbiology: Recent Results (from the past 240 hour(s))  MRSA PCR SCREENING     Status: None   Collection Time    11/01/13  3:35 AM      Result  Value Range Status   MRSA by PCR NEGATIVE  NEGATIVE Final   Comment:            The GeneXpert MRSA Assay (FDA     approved for NASAL specimens     only), is one component of a     comprehensive MRSA colonization     surveillance program. It is not     intended to diagnose MRSA     infection nor to guide or     monitor treatment for     MRSA infections.     Labs: Basic Metabolic Panel:  Recent Labs Lab 11/03/13 0500 11/04/13 0412 11/05/13 0350 11/06/13 0500 11/07/13 0425  NA 141 139 139 140 138  K 3.9 3.9 4.0 4.0 4.2  CL 95* 94* 95* 98 96  CO2 38* 37* 37* 35* 34*  GLUCOSE 88 107* 125* 108* 125*  BUN 24* 29* 33* 31* 29*  CREATININE 0.87 0.70 0.89 0.81 0.79  CALCIUM 10.3 10.4 10.2 10.0 10.3   Liver Function Tests:  Recent Labs Lab 10/31/13 2210  AST 18  ALT 10  ALKPHOS 91  BILITOT 0.6  PROT 7.1  ALBUMIN 3.9   CBC:  Recent Labs Lab 10/31/13 1514 10/31/13 2210 11/01/13 0755 11/02/13 0440 11/04/13 0412 11/07/13 0425  WBC 7.9 7.1 5.9 6.6 7.2 6.5  NEUTROABS 5.7 4.7  --   --   --   --   HGB 11.3* 11.2* 10.6* 11.4* 12.7 12.4  HCT 34.5* 36.0 34.3* 37.0 41.6 40.9  MCV 76.7* 80.2 79.8 81.0 80.3 80.8  PLT 228.0 194 163 171 224 215   Cardiac Enzymes:  Recent Labs Lab 11/01/13 1216 11/01/13 1626 11/01/13 2255  TROPONINI <0.30 <0.30 <0.30   BNP: BNP (last 3 results)  Recent Labs  10/31/13 1514 10/31/13 2210 11/07/13 0425  PROBNP 305.0* 2189.0* 802.3*   CBG:  Recent Labs Lab 11/06/13 0725 11/06/13 1156 11/06/13 1715 11/06/13 2148 11/07/13 0753  GLUCAP 94 148* 178* 139* 96     SIGNED: Time coordinating discharge: Over 30 minutes  Debbora Presto, MD  [facility] 11/07/2013, 9:04 AM Pager [phone]  If 7PM-7AM, please contact night-coverage [email or web address] Password [email or web address]

## 2013-11-07 NOTE — Progress Notes (Signed)
Spoke with Buyer, retail at [facility]. Can accept patient today. Recd information (FL2 and D/C Summary) faxed earlier today. Will place call for a 3pm pick up  To [facility] via ambulance. Pt excited to return. Cont with Plan of care

## 2013-11-11 ENCOUNTER — Other Ambulatory Visit: Payer: Self-pay | Admitting: *Deleted

## 2013-11-11 ENCOUNTER — Non-Acute Institutional Stay (SKILLED_NURSING_FACILITY): Payer: Medicare Other | Admitting: Adult Health

## 2013-11-11 DIAGNOSIS — J449 Chronic obstructive pulmonary disease, unspecified: Secondary | ICD-10-CM

## 2013-11-11 DIAGNOSIS — E119 Type 2 diabetes mellitus without complications: Secondary | ICD-10-CM

## 2013-11-11 DIAGNOSIS — Z7901 Long term (current) use of anticoagulants: Secondary | ICD-10-CM

## 2013-11-11 DIAGNOSIS — I4819 Other persistent atrial fibrillation: Secondary | ICD-10-CM

## 2013-11-11 DIAGNOSIS — I5032 Chronic diastolic (congestive) heart failure: Secondary | ICD-10-CM

## 2013-11-11 DIAGNOSIS — M109 Gout, unspecified: Secondary | ICD-10-CM

## 2013-11-11 DIAGNOSIS — I509 Heart failure, unspecified: Secondary | ICD-10-CM

## 2013-11-11 DIAGNOSIS — D649 Anemia, unspecified: Secondary | ICD-10-CM

## 2013-11-11 DIAGNOSIS — D869 Sarcoidosis, unspecified: Secondary | ICD-10-CM

## 2013-11-11 DIAGNOSIS — I4891 Unspecified atrial fibrillation: Secondary | ICD-10-CM

## 2013-11-11 DIAGNOSIS — E785 Hyperlipidemia, unspecified: Secondary | ICD-10-CM

## 2013-11-11 MED ORDER — HYDROCOD POLST-CHLORPHEN POLST 10-8 MG/5ML PO LQCR: 5.0000 mL | Freq: Two times a day (BID) | ORAL | Status: DC | PRN

## 2013-11-13 ENCOUNTER — Non-Acute Institutional Stay (SKILLED_NURSING_FACILITY): Payer: Medicare Other | Admitting: Internal Medicine

## 2013-11-13 DIAGNOSIS — G4733 Obstructive sleep apnea (adult) (pediatric): Secondary | ICD-10-CM

## 2013-11-13 DIAGNOSIS — J4489 Other specified chronic obstructive pulmonary disease: Secondary | ICD-10-CM

## 2013-11-13 DIAGNOSIS — J449 Chronic obstructive pulmonary disease, unspecified: Secondary | ICD-10-CM

## 2013-11-13 DIAGNOSIS — I5032 Chronic diastolic (congestive) heart failure: Secondary | ICD-10-CM

## 2013-11-13 DIAGNOSIS — I509 Heart failure, unspecified: Secondary | ICD-10-CM

## 2013-11-13 DIAGNOSIS — E1059 Type 1 diabetes mellitus with other circulatory complications: Secondary | ICD-10-CM

## 2013-11-17 NOTE — Assessment & Plan Note (Signed)
In long-term remission 

## 2013-11-17 NOTE — Assessment & Plan Note (Signed)
History of aspiration pneumonia. This may explain some of her cough and bronchitis symptoms. Plan-chest x-ray, CBC. Watch therapeutic response to Lasix

## 2013-11-17 NOTE — Assessment & Plan Note (Signed)
Favor mild persistent diastolic heart failure with interstitial edema and small pleural effusions. Plan-recommend Lasix 40 mg daily x7 days for therapeutic trial. Labs for chest x-ray, BNP, CBC

## 2013-11-18 ENCOUNTER — Non-Acute Institutional Stay (SKILLED_NURSING_FACILITY): Payer: Medicare Other | Admitting: Adult Health

## 2013-11-18 DIAGNOSIS — C50919 Malignant neoplasm of unspecified site of unspecified female breast: Secondary | ICD-10-CM

## 2013-11-18 DIAGNOSIS — D869 Sarcoidosis, unspecified: Secondary | ICD-10-CM

## 2013-11-18 DIAGNOSIS — I5032 Chronic diastolic (congestive) heart failure: Secondary | ICD-10-CM

## 2013-11-18 DIAGNOSIS — M109 Gout, unspecified: Secondary | ICD-10-CM

## 2013-11-18 DIAGNOSIS — J4489 Other specified chronic obstructive pulmonary disease: Secondary | ICD-10-CM

## 2013-11-18 DIAGNOSIS — E785 Hyperlipidemia, unspecified: Secondary | ICD-10-CM

## 2013-11-18 DIAGNOSIS — K5900 Constipation, unspecified: Secondary | ICD-10-CM

## 2013-11-18 DIAGNOSIS — E119 Type 2 diabetes mellitus without complications: Secondary | ICD-10-CM

## 2013-11-18 DIAGNOSIS — I4819 Other persistent atrial fibrillation: Secondary | ICD-10-CM

## 2013-11-18 DIAGNOSIS — I509 Heart failure, unspecified: Secondary | ICD-10-CM

## 2013-11-18 DIAGNOSIS — J449 Chronic obstructive pulmonary disease, unspecified: Secondary | ICD-10-CM

## 2013-11-18 DIAGNOSIS — I4891 Unspecified atrial fibrillation: Secondary | ICD-10-CM

## 2013-11-18 NOTE — Progress Notes (Addendum)
Patient ID: [name], female   DOB: 12-31-1940, 72 y.o.   MRN: [id number]                         PROGRESS NOTE  DATE: 11/11/2013  FACILITY: Nursing Home Location: [facility]  LEVEL OF CARE: SNF (31)  Acute Visit  CHIEF COMPLAINT: Follow-up Hospitalization  HISTORY OF PRESENT ILLNESS: This is a 72 year old female who has been readmitted to [facility] on 11/07/13 from [facility] with principal diagnosis of acute on chronic diastolic CHF. She has been re-admitted for a short-term rehabilitation.  REASSESSMENT OF ONGOING PROBLEM(S):  HTN: Pt 's HTN remains stable.  Denies CP, sob, DOE, pedal edema, headaches, dizziness or visual disturbances.  No complications from the medications currently being used.  Last BP : 112/63  COPD: the COPD remains stable.  Pt denies sob, cough, wheezing or declining exercise tolerance.  No complications from the medications presently being used.  ATRIAL FIBRILLATION: the patients atrial fibrillation remains stable.  The patient denies DOE, tachycardia, orthopnea, transient neurological sx, pedal edema, palpitations, & PNDs.  No complications noted from the medications currently being used.    PAST MEDICAL HISTORY : Reviewed.  No changes.  CURRENT MEDICATIONS: Reviewed per [facility]  REVIEW OF SYSTEMS:  GENERAL: no change in appetite, no fatigue, no weight changes, no fever, chills or weakness RESPIRATORY: no cough, SOB, DOE, wheezing, hemoptysis CARDIAC: no chest pain, or palpitations, +edema  GI: no abdominal pain, diarrhea, constipation, heart burn, nausea or vomiting  PHYSICAL EXAMINATION  VS:  T96.8       P72      RR19      BP112/63     POX98 % on O2 @2L /min via [place]      GENERAL: no acute distress, normal body habitus EYES: conjunctivae normal, sclerae normal, normal eye lids NECK: supple, trachea midline, no neck masses, no thyroid tenderness, no thyromegaly LYMPHATICS: no LAN in the neck, no supraclavicular  LAN RESPIRATORY: breathing is even & unlabored, BS CTAB CARDIAC: heart rate irregularly irregular, no murmur,no extra heart sounds, BLE edema, 1+ GI: abdomen soft, normal BS, no masses, no tenderness, no hepatomegaly, no splenomegaly PSYCHIATRIC: the patient is alert & oriented to person, affect & behavior appropriate  LABS/RADIOLOGY: 11/07/13 wbc 6.5  hgb 12.4  hct 40.7  NA 138  K 4.2  Glucose 125  BUN 29  Creatinine 0.79  CA 10.3   ASSESSMENT/PLAN:  Chronic diastolic CHF - stable; continue Lasix  Hypertension - well controlled; continue Cozaar  Constipation - no complaints  Gout - continue allopurinol and colchicine  Atrial fibrillation - rate controlled; continue Cardizem and Eliquis  COPD - stable; continue Xopenex, Symbicort and Atrovent  Anemia - discontinue ferrous fumarate  Hyperlipidemia - continue Zocor  Diabetes mellitus, type II - continue Humulin-N, Lantus and Glucophage   CPT CODE: 57846

## 2013-11-18 NOTE — Progress Notes (Signed)
Patient ID: [name], female   DOB: [birth year], 72 y.o.   MRN: [id number]              PROGRESS NOTE  DATE: 11/18/2013   FACILITY: [facility]  LEVEL OF CARE: SNF (31)  Acute Visit  CHIEF COMPLAINT:  Discharge Notes  HISTORY OF PRESENT ILLNESS: This is a 72 year old female who is for discharge home with Home health PT, OT, Nursing and Home health Aide. She has been readmitted to [facility] on 11/07/13 from [facility] with principal diagnosis of acute on chronic diastolic CHF.  Patient was admitted to this facility for short-term rehabilitation after the patient's recent hospitalization.  Patient has completed SNF rehabilitation and therapy has cleared the patient for discharge.  Reassessment of ongoing problem(s):  HTN: Pt 's HTN remains stable.  Denies CP, sob, DOE, pedal edema, headaches, dizziness or visual disturbances.  No complications from the medications currently being used.  Last BP : 110/89  CHF:The patient does not relate significant weight changes, denies sob, DOE, orthopnea, PNDs, pedal edema, palpitations or chest pain.  CHF remains stable.  No complications form the medications being used.  GOUT: Patien'st  gout remains stable. Patient denies joint pan, redness, swelling or warmth. No complications reported from the medications presently being used.   PAST MEDICAL HISTORY : Reviewed.  No changes.  CURRENT MEDICATIONS: Reviewed per [facility]  REVIEW OF SYSTEMS:  GENERAL: no change in appetite, no fatigue, no weight changes, no fever, chills or weakness RESPIRATORY: no cough, SOB, DOE, wheezing, hemoptysis CARDIAC: no chest pain, edema or palpitations GI: no abdominal pain, diarrhea, constipation, heart burn, nausea or vomiting  PHYSICAL EXAMINATION  VS:  T98       P76       RR20      BP110/89      POX 98%       WT242.8 (Lb)  GENERAL: no acute distress, normal body habitus EYES: conjunctivae normal, sclerae normal, normal eye  lids NECK: supple, trachea midline, no neck masses, no thyroid tenderness, no thyromegaly LYMPHATICS: no LAN in the neck, no supraclavicular LAN RESPIRATORY: breathing is even & unlabored, BS CTAB CARDIAC: RRR, no murmur,no extra heart sounds, no edema GI: abdomen soft, normal BS, no masses, no tenderness, no hepatomegaly, no splenomegaly PSYCHIATRIC: the patient is alert & oriented to person, affect & behavior appropriate  LABS/RADIOLOGY: 11/07/13 wbc 6.5  hgb 12.4  hct 40.7  NA 138  K 4.2  Glucose 125  BUN 29  Creatinine 0.79  CA 10.3 10/21/13  NA 139  K 4.1  Glucose 116  BUN 25  Creatinine 0.9  CA 9.9  ASSESSMENT/PLAN:  Chronic diastolic CHF - stable; continue Lasix  Hypertension - well controlled; continue Cozaar  Constipation - no complaints  Gout - continue allopurinol and colchicine  Atrial fibrillation - rate controlled; continue Cardizem and Eliquis  COPD - stable; continue Xopenex, Symbicort and Atrovent  Hyperlipidemia - continue Zocor  Diabetes mellitus, type II - continue Humulin-N, Lantus and Glucophage   I have filled out patient's discharge paperwork and written prescriptions.  Patient will receive home health PT, OT, Nursing and Home health Aide.   Total discharge time: Less than 30 minutes Discharge time involved coordination of the discharge process with Child psychotherapist, nursing staff and therapy department. Medical justification for home health services verified.  CPT CODE: 11914

## 2013-11-21 ENCOUNTER — Encounter: Payer: Self-pay | Admitting: *Deleted

## 2013-11-22 ENCOUNTER — Encounter: Payer: Self-pay | Admitting: Internal Medicine

## 2013-11-23 DIAGNOSIS — I5033 Acute on chronic diastolic (congestive) heart failure: Secondary | ICD-10-CM

## 2013-11-23 DIAGNOSIS — D649 Anemia, unspecified: Secondary | ICD-10-CM

## 2013-11-23 DIAGNOSIS — R262 Difficulty in walking, not elsewhere classified: Secondary | ICD-10-CM

## 2013-11-23 DIAGNOSIS — J441 Chronic obstructive pulmonary disease with (acute) exacerbation: Secondary | ICD-10-CM

## 2013-11-25 ENCOUNTER — Ambulatory Visit (INDEPENDENT_AMBULATORY_CARE_PROVIDER_SITE_OTHER): Payer: Medicare Other | Admitting: Internal Medicine

## 2013-11-25 ENCOUNTER — Encounter: Payer: Self-pay | Admitting: Internal Medicine

## 2013-11-25 VITALS — BP 118/56 | HR 84 | Ht 59.0 in | Wt 236.8 lb

## 2013-11-25 DIAGNOSIS — R001 Bradycardia, unspecified: Secondary | ICD-10-CM

## 2013-11-25 DIAGNOSIS — I4819 Other persistent atrial fibrillation: Secondary | ICD-10-CM

## 2013-11-25 DIAGNOSIS — J449 Chronic obstructive pulmonary disease, unspecified: Secondary | ICD-10-CM

## 2013-11-25 DIAGNOSIS — I498 Other specified cardiac arrhythmias: Secondary | ICD-10-CM

## 2013-11-25 DIAGNOSIS — I5032 Chronic diastolic (congestive) heart failure: Secondary | ICD-10-CM

## 2013-11-25 DIAGNOSIS — I509 Heart failure, unspecified: Secondary | ICD-10-CM

## 2013-11-25 DIAGNOSIS — I4891 Unspecified atrial fibrillation: Secondary | ICD-10-CM

## 2013-11-25 DIAGNOSIS — Z7901 Long term (current) use of anticoagulants: Secondary | ICD-10-CM

## 2013-11-25 MED ORDER — RIVAROXABAN 20 MG PO TABS: 20.0000 mg | ORAL_TABLET | Freq: Every day | ORAL | Status: DC

## 2013-11-25 NOTE — Progress Notes (Signed)
OFFICE NOTE  Chief Complaint:  Shortness of breath  Primary Care Physician: [name], MD  HPI:  [name] is a 72 year old female who is morbidly obese and recently had bilateral knee replacement. She also has a history of dyslipidemia, hypertension, sleep apnea, diabetes type 2, sarcoidosis, paroxysmal A-fib on Eliquis.  Unfortunately, she also had breast cancer which I understand she is now in remission for. She was recently admitted to the hospital from skilled nursing facility as she was found to be bradycardic with HR in the 40s. Per pt, this has been associated with dyspnea on exertion and at rest, 2-3 pillow orthopnea, progressively worsening LE swelling. Her pulmonologist increased the dose of Lasix to 40 mg BID but she has not started this piror to this admission. She was found to be in acute diastolic heart failure and was diureses. Her breathing had improved however she was having persistent cough and she was on an ACE inhibitor which was changed to an ARB. She improved fairly quickly with diuresis and was discharged with a weight of 244 pounds. Her weight today is actually 236 pounds and she reportedly is wearing "heavy shoes".  She has chronic dyspnea which is not any worse than it was when she was discharged. Her only other concern today is that she found that recently that [employer] plans to not cover her Eliquis.  PMHx:  Past Medical History  Diagnosis Date  . Diabetes mellitus     type 2  . Asthma   . Bronchitis   . Hernia   . Anemia   . Uterine cancer     s/p hysterectomy  . Arthritis   . Sarcoidosis   . Morbid obesity   . Sarcoid   . OSA (obstructive sleep apnea)     Uses nasal CPAP Q HS  . Dyslipidemia   . Breast cancer, stage 1 10/17/2011    s/p lumpectomy and XRT  . Cough   . Wheezing   . Chills   . Constipation   . Bruises easily   . Gout attack     ankle, then wrist and hands  . Hypertension   . Atrial fibrillation, persistent 11/01/2013  .  Chronic anticoagulation, on Eliquis 11/01/2013  . History of nuclear stress test 02/09/2010    dipyridamole; normal pattern of perfusion in all regions with attenuation artifact in inferior region, low risk     Past Surgical History  Procedure Laterality Date  . Total knee arthroplasty Bilateral 2001  . Abdominal hysterectomy  02/04/2005    umbilical hernia repair @ same time  . Uterine surgery - resection      r/t cancer  . Left lower leg fx--casted    . Breast lumpectomy Right   . Transthoracic echocardiogram  07/2008    EF, LV size is normal; RVSP normal; mod calcif of MV apparatus    FAMHx:  Family History  Problem Relation Age of Onset  . Diabetes Mother   . Colon cancer Father   . Diabetes Brother   . Lung cancer Brother   . Diabetes Sister   . Cervical cancer Sister     SOCHx:   reports that she has never smoked. She has never used smokeless tobacco. She reports that she does not drink alcohol or use illicit drugs.  ALLERGIES:  Allergies  Allergen Reactions  . Aspirin     Avoids due to being on blood thinners  . Contrast Media [Iodinated Diagnostic Agents] Nausea And Vomiting  .  Ibuprofen     Avoids due to being on blood thinners  . Pravachol Other (See Comments)    Muscle pain  . Pravastatin Sodium     ROS: A comprehensive review of systems was negative except for: Respiratory: positive for dyspnea on exertion  HOME MEDS: Current Outpatient Prescriptions  Medication Sig Dispense Refill  . acetaminophen (TYLENOL) 500 MG tablet Take 1,000 mg by mouth every 6 (six) hours as needed. pain      . albuterol (PROVENTIL HFA) 108 (90 BASE) MCG/ACT inhaler Inhale 2 puffs into the lungs every 6 (six) hours as needed for wheezing.      Marland Kitchen allopurinol (ZYLOPRIM) 100 MG tablet Take 200 mg by mouth daily.       . Ascorbic Acid (VITAMIN C) 1000 MG tablet Take 1,000 mg by mouth daily.      . colchicine 0.6 MG tablet Take 1 tablet (0.6 mg total) by mouth 2 (two) times daily.   60 tablet  0  . Diltiazem HCl ER 360 MG CP24 Take 360 mg by mouth daily.      Marland Kitchen exemestane (AROMASIN) 25 MG tablet Take 1 tablet (25 mg total) by mouth daily after breakfast.  30 tablet  12  . ferrous fumarate (HEMOCYTE - 106 MG FE) 325 (106 FE) MG TABS Take 1 tablet by mouth 2 (two) times daily.       . Fluticasone-Salmeterol (ADVAIR) 250-50 MCG/DOSE AEPB Inhale 1 puff into the lungs 2 (two) times daily.      . furosemide (LASIX) 40 MG tablet Take 1 tablet (40 mg total) by mouth 2 (two) times daily.  60 tablet  0  . guaiFENesin (MUCINEX) 600 MG 12 hr tablet Take 1,200 mg by mouth 2 (two) times daily.      Marland Kitchen HUMULIN N 100 UNIT/ML injection Inject 5 Units into the skin as needed (5 units if blood sugar is greater than 150).       Marland Kitchen HYDROcodone-acetaminophen (NORCO/VICODIN) 5-325 MG per tablet Take 1 tablet by mouth every 6 (six) hours as needed. For pain.  60 tablet  0  . insulin glargine (LANTUS) 100 UNIT/ML injection Inject 0.28 mLs (28 Units total) into the skin at bedtime.  10 mL  12  . ipratropium (ATROVENT) 0.02 % nebulizer solution Take 2.5 mLs (0.5 mg total) by nebulization every 6 (six) hours.  75 mL  0  . LORazepam (ATIVAN) 0.5 MG tablet Take one tablet by mouth every night at bedtime as needed for anxiety  30 tablet  0  . meclizine (ANTIVERT) 25 MG tablet Take 6.25-12.5 mg by mouth 3 (three) times daily as needed. For dizziness.      . metFORMIN (GLUCOPHAGE) 500 MG tablet Take 500 mg by mouth 2 (two) times daily with a meal. If cbg is greater than 120 pt takes medication      . Multiple Vitamin (MULTIVITAMIN WITH MINERALS) TABS Take 1 tablet by mouth daily.      . ondansetron (ZOFRAN) 4 MG tablet Take 4 mg by mouth every 8 (eight) hours as needed for nausea.      . polyethylene glycol (MIRALAX / GLYCOLAX) packet Take 17 g by mouth daily.  14 each  0  . potassium chloride SA (K-DUR,KLOR-CON) 20 MEQ tablet Take 1 tablet (20 mEq total) by mouth daily.  30 tablet  0  . simvastatin (ZOCOR) 40  MG tablet Take 40 mg by mouth at bedtime.      . irbesartan (AVAPRO) 150 MG  tablet Take 1 tablet (150 mg total) by mouth daily.  30 tablet  0  . Rivaroxaban (XARELTO) 20 MG TABS tablet Take 1 tablet (20 mg total) by mouth daily with supper.  30 tablet  6   No current facility-administered medications for this visit.    LABS/IMAGING: No results found for this or any previous visit (from the past 48 hour(s)). No results found.  VITALS: BP 118/56  Pulse 84  Ht 4\' 11"  (1.499 m)  Wt 236 lb 12.8 oz (107.412 kg)  BMI 47.80 kg/m2  EXAM: General appearance: alert, no distress and morbidly obese Neck: no carotid bruit and no JVD Lungs: diminished breath sounds bilaterally Heart: irregularly irregular rhythm Abdomen: soft, non-tender; bowel sounds normal; no masses,  no organomegaly and obese Extremities: extremities normal, atraumatic, no cyanosis or edema and compression stockings in place Pulses: 2+ and symmetric Skin: Skin color, texture, turgor normal. No rashes or lesions Neurologic: Grossly normal Psych: Pleasant, normal  EKG: deferred  ASSESSMENT: 1. Persistent atrial fibrillation on Eliquis 2. Hypertension 3. Diabetes type 2 - on insulin 4. Morbid obesity 5. Obstructive sleep apnea 6. Sarcoidosis 7. Dyslipidemia  PLAN: 1.   Mrs. Baik is doing fairly well after her hospital discharge. Her weight has stayed stable if not down 4-6 pounds from her hospital discharge. Her blood pressure is remaining steady. Her heart is still in atrial fibrillation which is again persisted. She reports her a.m. blood sugars are between 110 and 120, which is good control of her diabetes. She is tolerating Eliquis without any bleeding issues, however her insurance company reportedly is not going to continue to cover this. We'll plan on switching her to Xarelto 20 mg daily today. Followup in 6 months.  Chrystie Nose, MD, [facility] Attending Cardiologist [facility]  Delta Pichon  C 11/25/2013, 12:00 PM

## 2013-11-25 NOTE — Patient Instructions (Signed)
STOP Eliquis.  START Xarelto 20mg  once daily.  Your physician wants you to follow-up in: 6 months with Dr. Rennis Golden. You will receive a reminder letter in the mail two months in advance. If you don't receive a letter, please call our office to schedule the follow-up appointment.

## 2013-11-27 DIAGNOSIS — I4891 Unspecified atrial fibrillation: Secondary | ICD-10-CM | POA: Insufficient documentation

## 2013-11-27 DIAGNOSIS — I10 Essential (primary) hypertension: Secondary | ICD-10-CM | POA: Insufficient documentation

## 2013-11-27 DIAGNOSIS — K5900 Constipation, unspecified: Secondary | ICD-10-CM | POA: Insufficient documentation

## 2014-01-01 ENCOUNTER — Telehealth: Payer: Self-pay | Admitting: Internal Medicine

## 2014-01-01 NOTE — Telephone Encounter (Signed)
Returned a call to patient. She requests that we call [employer] to get a PA for her to get her Eliquis. She would prefer to continue this instead of switching to Xarelto. Informed the patient that we will attempt to get a PA for the Eliquis.

## 2014-01-01 NOTE — Telephone Encounter (Signed)
Returning your call. °

## 2014-01-01 NOTE — Telephone Encounter (Signed)
Please call-they are only giving her one Eliquis a day,she need two a day.

## 2014-01-01 NOTE — Telephone Encounter (Signed)
Returned a call. Patient could not talk. States she will call me back.

## 2014-01-02 ENCOUNTER — Telehealth: Payer: Self-pay | Admitting: *Deleted

## 2014-01-02 NOTE — Telephone Encounter (Signed)
Faxed completed drug quantity limitations form for Eliquis 5mg  BID.Marland Kitchen Patient requests to remain on this medication instead of switching to Xarelto (11/2013). Confirmed that this is ok with K. Alvstad, PharmD  See documentation below.Sharon Levy, [name] Tommy Medal, RPH-CPP; Pixie Casino, MD            I got a triage message sent to me yesterday while I was off.. This patient had been on coumadin, switched to Eliquis in August 2014 r/t low Hgb, then in December decided she wanted to go on Xarelto because insurance would not cover.. Called in yesterday wanting to go back on Eliquis cause now insurance will cover if we do prior authorization.. In process of prior authorization.. Is this ok?        RE: xarelto to eliquis Received: Today     Tommy Medal, RPH-CPP Sharon Levy, [name]            Eliezer Lofts   This should be fine as long as you can get the PA through.   Erasmo Downer

## 2014-01-03 ENCOUNTER — Other Ambulatory Visit: Payer: Self-pay | Admitting: *Deleted

## 2014-01-03 MED ORDER — APIXABAN 5 MG PO TABS: 5.0000 mg | ORAL_TABLET | Freq: Two times a day (BID) | ORAL | Status: DC

## 2014-01-03 NOTE — Telephone Encounter (Signed)
Patient wants to remain on Eliquis 5mg  BID instead of switching to xarelto 20mg  QD. quantity limitations form had to be submitted to [facility] for this. Done 01/02/14  Rx was sent to pharmacy electronically.

## 2014-01-06 ENCOUNTER — Telehealth: Payer: Self-pay | Admitting: Internal Medicine

## 2014-01-06 NOTE — Telephone Encounter (Signed)
This a request for Eliquis will be closed out because is to get 60 tablets for 30 days.

## 2014-01-06 NOTE — Telephone Encounter (Signed)
Message forwarded to J. Elkins, RN.  

## 2014-01-06 NOTE — Telephone Encounter (Signed)
Called [employer] - customer service rep stated no determination had been made on this drug for the quantity limitations as of now and our office will receive notification if/when it is approved.

## 2014-01-09 ENCOUNTER — Telehealth: Payer: Self-pay | Admitting: Internal Medicine

## 2014-01-09 NOTE — Telephone Encounter (Signed)
Returned call and informed pt per instructions by MD/PA.  Pt verbalized understanding and agreed w/ plan.  Appt scheduled for 2.12.15 at 2:45pm w/ Dr. Debara Pickett.  Pt will call back to reschedule if she is not able to arrange transportation.

## 2014-01-09 NOTE — Telephone Encounter (Signed)
Returned call and pt verified x 2.  Pt stated she has put on 20 lbs since December.  Stated she is taking two 40 mg fluid pills a day.  Pt denied swelling and c/o "a little bit of shortness of breath."  Pt stated the SOB is "nothing major."  RN asked pt to review weights.  See below.  12/25:  220 lbs 1/1:  235 lbs 1/8: 236 lbs 1/15: 238 lbs 1/22: 238 lbs Today: 240 lbs  Pt also c/o intermittent non-productive cough.  Stated "it's nothing major."  Pt wants to know if she needs to take more of her fluid pill.  Pt informed MD will be notified for further instructions as she does not have a sliding scale on her fluid pill.  Pt verbalized understanding and agreed w/ plan.  Pt w/o audible difficulty breathing.

## 2014-01-09 NOTE — Telephone Encounter (Signed)
Dr. Sallyanne Kuster (DOD) notified and advised pt increase furosemide to 80 mg in AM and 40 mg in PM until she loses the 20 lbs and increase K+ to 40 mEq daily.  Also advised pt come in for first available appt.  Returned call.  Left message to call back as soon as message received.  PLEASE PAGE TRIAGE WHEN PT CALLS BACK.

## 2014-01-09 NOTE — Telephone Encounter (Signed)
Please call-thinks she is getting some fluid in her lungs.

## 2014-01-14 ENCOUNTER — Encounter: Payer: Self-pay | Admitting: Internal Medicine

## 2014-01-14 DIAGNOSIS — E119 Type 2 diabetes mellitus without complications: Secondary | ICD-10-CM | POA: Insufficient documentation

## 2014-01-14 DIAGNOSIS — E1059 Type 1 diabetes mellitus with other circulatory complications: Secondary | ICD-10-CM | POA: Insufficient documentation

## 2014-01-14 DIAGNOSIS — E1051 Type 1 diabetes mellitus with diabetic peripheral angiopathy without gangrene: Secondary | ICD-10-CM | POA: Insufficient documentation

## 2014-01-14 NOTE — Progress Notes (Signed)
Patient ID: [name], female   DOB: [birth year], 73 y.o.   MRN: [id number]               HISTORY & PHYSICAL  DATE: 11/13/2013     FACILITY: [facility]  LEVEL OF CARE: SNF (31)  ALLERGIES:  Allergies  Allergen Reactions  . Aspirin     Avoids due to being on blood thinners  . Contrast Media [Iodinated Diagnostic Agents] Nausea And Vomiting  . Ibuprofen     Avoids due to being on blood thinners  . Pravachol Other (See Comments)    Muscle pain  . Pravastatin Sodium     CHIEF COMPLAINT:  Manage CHF, diabetes mellitus, and COPD.    HISTORY OF PRESENT ILLNESS:  The patient is a 73 year-old, Caucasian female who was hospitalized secondary to bradycardia.  After hospitalization, she is admitted to this facility for short-term rehabilitation.  She has the following problems:    CHF:The patient does not relate significant weight changes, denies sob, DOE, orthopnea, PNDs, pedal edema, palpitations or chest pain.  CHF remains stable.  No complications form the medications being used.    DM:pt's DM remains stable.  Pt denies polyuria, polydipsia, polyphagia, changes in vision or hypoglycemic episodes.  No complications noted from the medication presently being used.  Last hemoglobin A1c is:    COPD: the COPD remains stable.  Pt denies sob, cough, wheezing or declining exercise tolerance.  No complications from the medications presently being used.    PAST MEDICAL HISTORY :  Past Medical History  Diagnosis Date  . Diabetes mellitus     type 2  . Asthma   . Bronchitis   . Hernia   . Anemia   . Uterine cancer     s/p hysterectomy  . Arthritis   . Sarcoidosis   . Morbid obesity   . Sarcoid   . OSA (obstructive sleep apnea)     Uses nasal CPAP Q HS  . Dyslipidemia   . Breast cancer, stage 1 10/17/2011    s/p lumpectomy and XRT  . Cough   . Wheezing   . Chills   . Constipation   . Bruises easily   . Gout attack     ankle, then wrist and hands  .  Hypertension   . Atrial fibrillation, persistent 11/01/2013  . Chronic anticoagulation, on Eliquis 11/01/2013  . History of nuclear stress test 02/09/2010    dipyridamole; normal pattern of perfusion in all regions with attenuation artifact in inferior region, low risk     PAST SURGICAL HISTORY: Past Surgical History  Procedure Laterality Date  . Total knee arthroplasty Bilateral 2001  . Abdominal hysterectomy  [id number]    umbilical hernia repair @ same time  . Uterine surgery - resection      r/t cancer  . Left lower leg fx--casted    . Breast lumpectomy Right   . Transthoracic echocardiogram  07/2008    EF, LV size is normal; RVSP normal; mod calcif of MV apparatus    SOCIAL HISTORY:  reports that she has never smoked. She has never used smokeless tobacco. She reports that she does not drink alcohol or use illicit drugs.  FAMILY HISTORY:  Family History  Problem Relation Age of Onset  . Diabetes Mother   . Colon cancer Father   . Diabetes Brother   . Lung cancer Brother   . Diabetes Sister   . Cervical cancer Sister  CURRENT MEDICATIONS: Reviewed per [facility]  REVIEW OF SYSTEMS:  See HPI otherwise 14 point ROS is negative.  PHYSICAL EXAMINATION  VS:  T 96.1       P 92      RR 19      BP 113/60      POX 96%         GENERAL: no acute distress, morbidly obese body habitus EYES: conjunctivae normal, sclerae normal, normal eye lids MOUTH/THROAT: lips without lesions,no lesions in the mouth,tongue is without lesions,uvula elevates in midline NECK: supple, trachea midline, no neck masses, no thyroid tenderness, no thyromegaly LYMPHATICS: no LAN in the neck, no supraclavicular LAN RESPIRATORY: breathing is even & unlabored, BS CTAB CARDIAC: RRR, no murmur,no extra heart sounds EDEMA/VARICOSITIES:  +2 bilateral lower extremity edema  ARTERIAL:  pedal pulses nonpalpable   GI:  ABDOMEN: abdomen soft, normal BS, no masses, no tenderness  LIVER/SPLEEN: no hepatomegaly, no  splenomegaly MUSCULOSKELETAL: HEAD: normal to inspection & palpation BACK: no kyphosis, scoliosis or spinal processes tenderness EXTREMITIES: LEFT UPPER EXTREMITY: full range of motion, normal strength & tone RIGHT UPPER EXTREMITY:  full range of motion, normal strength & tone LEFT LOWER EXTREMITY: strength intact, range of motion moderate   RIGHT LOWER EXTREMITY: strength intact, range of motion moderate    PSYCHIATRIC: the patient is alert & oriented to person, affect & behavior appropriate  LABS/RADIOLOGY: TSH 4.065.    2D-echo showed EF of 50-60%.    MRSA by PCR negative.     Labs reviewed: Basic Metabolic Panel:  Recent Labs  05/08/13 0458  05/18/13 1226 05/19/13 0505  11/05/13 0350 11/06/13 0500 11/07/13 0425  NA 141  < > 137 140  < > 139 140 138  K 2.9*  < > 4.0 4.0  < > 4.0 4.0 4.2  CL 94*  < > 102 104  < > 95* 98 96  CO2 39*  < > 27 33*  < > 37* 35* 34*  GLUCOSE 251*  < > 166* 74  < > 125* 108* 125*  BUN 38*  < > 18 20  < > 33* 31* 29*  CREATININE 0.78  < > 0.76 0.88  < > 0.89 0.81 0.79  CALCIUM 9.3  < > 8.3* 7.7*  < > 10.2 10.0 10.3  MG 1.7  --   --  1.7  --   --   --   --   PHOS  --   --   --  2.3  --   --   --   --   < > = values in this interval not displayed. Liver Function Tests:  Recent Labs  08/18/13 2145 09/09/13 1029 10/31/13 2210  AST 15 17 18   ALT 8 11 10   ALKPHOS 58 98 91  BILITOT 1.0 0.61 0.6  PROT 6.9 6.2* 7.1  ALBUMIN 4.0 3.2* 3.9    Recent Labs  05/03/13 1336  LIPASE 27   CBC:  Recent Labs  09/09/13 1029 10/31/13 1514 10/31/13 2210  11/02/13 0440 11/04/13 0412 11/07/13 0425  WBC 5.8 7.9 7.1  < > 6.6 7.2 6.5  NEUTROABS 4.5 5.7 4.7  --   --   --   --   HGB 10.6* 11.3* 11.2*  < > 11.4* 12.7 12.4  HCT 33.0* 34.5* 36.0  < > 37.0 41.6 40.9  MCV 75.3* 76.7* 80.2  < > 81.0 80.3 80.8  PLT 198 228.0 194  < > 171 224 215  < > =  values in this interval not displayed.  Cardiac Enzymes:  Recent Labs  05/04/13 2009  08/18/13 2145 11/01/13 1216 11/01/13 1626 11/01/13 2255  CKTOTAL  --  33  --   --   --   TROPONINI <0.30  --  <0.30 <0.30 <0.30   CBG:  Recent Labs  11/06/13 2148 11/07/13 0753 11/07/13 1214  GLUCAP 139* 96 157*    ASSESSMENT/PLAN:  CHF.  Well compensated.    Diabetes mellitus with vascular complications.   Continue current medications.    COPD.  Well compensated.    Obstructive sleep apnea.  Continue CPAP.     History of breast cancer.  Status post lumpectomy and radiation treatment.  Followed by the oncologist.  Continue Aromasin.    Atrial fibrillation.  Rate controlled.    Pulmonary sarcoidosis.  No acute issues.    Check CBC and BMP.    THN Metrics:   No tobacco.  No aspirin.    I have reviewed patient's medical records received at admission/from hospitalization.  CPT CODE: 01007

## 2014-01-23 ENCOUNTER — Ambulatory Visit: Payer: Medicare Other | Admitting: Internal Medicine

## 2014-01-23 ENCOUNTER — Telehealth: Payer: Self-pay | Admitting: Internal Medicine

## 2014-01-23 NOTE — Telephone Encounter (Signed)
Returned call and pt verified x 2.  Pt informed per Dr. Debara Pickett.  Pt verbalized understanding and agreed w/ plan.  Of note, pt did not sound like she was in any respiratory distress.

## 2014-01-23 NOTE — Telephone Encounter (Signed)
Andee Poles, [facility] nurse w/ [facility], called. Stated pt's weight is up 10 lbs now.  Stated pt has been wearing O2 x 1 week and is taking Lasix 80 mg in AM and 40 mg in PM.  Danielle informed pt was scheduled to be seen today at 2:45pm, but appt was rescheduled for tomorrow at 2 pm w/ Dr. Debara Pickett.  Andee Poles stated pt said she didn't have transportation for today and her appt was moved back one day.  Danielle wants to know if pt can take an extra Lasix tonight and informed Dr. Debara Pickett will be notified for further instructions.  RN will call pt back.  Verbalized understanding.  Message forwarded to Dr. Debara Pickett.

## 2014-01-23 NOTE — Telephone Encounter (Signed)
Have her take 80 mg tonight .. Bring daily weights. I will see her tomorrow.  -Dr. Debara Pickett

## 2014-01-24 ENCOUNTER — Ambulatory Visit (INDEPENDENT_AMBULATORY_CARE_PROVIDER_SITE_OTHER): Payer: Medicare Other | Admitting: Internal Medicine

## 2014-01-24 ENCOUNTER — Encounter (HOSPITAL_COMMUNITY): Payer: Self-pay | Admitting: General Practice

## 2014-01-24 ENCOUNTER — Encounter: Payer: Self-pay | Admitting: Internal Medicine

## 2014-01-24 ENCOUNTER — Inpatient Hospital Stay (HOSPITAL_COMMUNITY)
Admission: AD | Admit: 2014-01-24 | Discharge: 2014-01-30 | DRG: 292 | Disposition: A | Discharge status: Home Health | Payer: Medicare Other | Source: Ambulatory Visit | Attending: Internal Medicine | Admitting: Internal Medicine

## 2014-01-24 VITALS — BP 132/70 | HR 76 | Ht 59.0 in | Wt 260.9 lb

## 2014-01-24 DIAGNOSIS — E785 Hyperlipidemia, unspecified: Secondary | ICD-10-CM | POA: Diagnosis present

## 2014-01-24 DIAGNOSIS — I5033 Acute on chronic diastolic (congestive) heart failure: Secondary | ICD-10-CM

## 2014-01-24 DIAGNOSIS — I4819 Other persistent atrial fibrillation: Secondary | ICD-10-CM

## 2014-01-24 DIAGNOSIS — D649 Anemia, unspecified: Secondary | ICD-10-CM | POA: Diagnosis present

## 2014-01-24 DIAGNOSIS — J449 Chronic obstructive pulmonary disease, unspecified: Secondary | ICD-10-CM | POA: Diagnosis present

## 2014-01-24 DIAGNOSIS — Z8049 Family history of malignant neoplasm of other genital organs: Secondary | ICD-10-CM

## 2014-01-24 DIAGNOSIS — E662 Morbid (severe) obesity with alveolar hypoventilation: Secondary | ICD-10-CM | POA: Diagnosis present

## 2014-01-24 DIAGNOSIS — Z889 Allergy status to unspecified drugs, medicaments and biological substances status: Secondary | ICD-10-CM

## 2014-01-24 DIAGNOSIS — J9691 Respiratory failure, unspecified with hypoxia: Secondary | ICD-10-CM

## 2014-01-24 DIAGNOSIS — I10 Essential (primary) hypertension: Secondary | ICD-10-CM | POA: Diagnosis present

## 2014-01-24 DIAGNOSIS — I509 Heart failure, unspecified: Secondary | ICD-10-CM

## 2014-01-24 DIAGNOSIS — C50311 Malignant neoplasm of lower-inner quadrant of right female breast: Secondary | ICD-10-CM | POA: Diagnosis present

## 2014-01-24 DIAGNOSIS — I482 Chronic atrial fibrillation, unspecified: Secondary | ICD-10-CM | POA: Diagnosis present

## 2014-01-24 DIAGNOSIS — Z7901 Long term (current) use of anticoagulants: Secondary | ICD-10-CM

## 2014-01-24 DIAGNOSIS — C50919 Malignant neoplasm of unspecified site of unspecified female breast: Secondary | ICD-10-CM | POA: Diagnosis present

## 2014-01-24 DIAGNOSIS — J40 Bronchitis, not specified as acute or chronic: Secondary | ICD-10-CM | POA: Diagnosis present

## 2014-01-24 DIAGNOSIS — Z8542 Personal history of malignant neoplasm of other parts of uterus: Secondary | ICD-10-CM

## 2014-01-24 DIAGNOSIS — J441 Chronic obstructive pulmonary disease with (acute) exacerbation: Secondary | ICD-10-CM | POA: Diagnosis present

## 2014-01-24 DIAGNOSIS — J9611 Chronic respiratory failure with hypoxia: Secondary | ICD-10-CM

## 2014-01-24 DIAGNOSIS — I8390 Asymptomatic varicose veins of unspecified lower extremity: Secondary | ICD-10-CM | POA: Diagnosis present

## 2014-01-24 DIAGNOSIS — I4891 Unspecified atrial fibrillation: Secondary | ICD-10-CM | POA: Diagnosis present

## 2014-01-24 DIAGNOSIS — Z800 Family history of malignant neoplasm of digestive organs: Secondary | ICD-10-CM

## 2014-01-24 DIAGNOSIS — J4489 Other specified chronic obstructive pulmonary disease: Secondary | ICD-10-CM | POA: Diagnosis present

## 2014-01-24 DIAGNOSIS — M129 Arthropathy, unspecified: Secondary | ICD-10-CM | POA: Diagnosis present

## 2014-01-24 DIAGNOSIS — Z96659 Presence of unspecified artificial knee joint: Secondary | ICD-10-CM

## 2014-01-24 DIAGNOSIS — J99 Respiratory disorders in diseases classified elsewhere: Secondary | ICD-10-CM | POA: Diagnosis present

## 2014-01-24 DIAGNOSIS — D869 Sarcoidosis, unspecified: Secondary | ICD-10-CM | POA: Diagnosis present

## 2014-01-24 DIAGNOSIS — G4733 Obstructive sleep apnea (adult) (pediatric): Secondary | ICD-10-CM | POA: Diagnosis present

## 2014-01-24 DIAGNOSIS — Z6841 Body Mass Index (BMI) 40.0 and over, adult: Secondary | ICD-10-CM

## 2014-01-24 DIAGNOSIS — Z801 Family history of malignant neoplasm of trachea, bronchus and lung: Secondary | ICD-10-CM

## 2014-01-24 DIAGNOSIS — E876 Hypokalemia: Secondary | ICD-10-CM | POA: Diagnosis not present

## 2014-01-24 DIAGNOSIS — Z79899 Other long term (current) drug therapy: Secondary | ICD-10-CM

## 2014-01-24 DIAGNOSIS — Z923 Personal history of irradiation: Secondary | ICD-10-CM

## 2014-01-24 DIAGNOSIS — I798 Other disorders of arteries, arterioles and capillaries in diseases classified elsewhere: Secondary | ICD-10-CM | POA: Diagnosis present

## 2014-01-24 DIAGNOSIS — I2789 Other specified pulmonary heart diseases: Secondary | ICD-10-CM | POA: Diagnosis present

## 2014-01-24 DIAGNOSIS — I272 Pulmonary hypertension, unspecified: Secondary | ICD-10-CM | POA: Diagnosis present

## 2014-01-24 DIAGNOSIS — Z794 Long term (current) use of insulin: Secondary | ICD-10-CM

## 2014-01-24 DIAGNOSIS — Z833 Family history of diabetes mellitus: Secondary | ICD-10-CM

## 2014-01-24 DIAGNOSIS — E1159 Type 2 diabetes mellitus with other circulatory complications: Secondary | ICD-10-CM | POA: Diagnosis present

## 2014-01-24 DIAGNOSIS — I5032 Chronic diastolic (congestive) heart failure: Secondary | ICD-10-CM | POA: Diagnosis present

## 2014-01-24 DIAGNOSIS — E119 Type 2 diabetes mellitus without complications: Secondary | ICD-10-CM | POA: Diagnosis present

## 2014-01-24 HISTORY — DX: Obstructive sleep apnea (adult) (pediatric): G47.33

## 2014-01-24 HISTORY — DX: Unspecified fracture of left lower leg, initial encounter for closed fracture: S82.92XA

## 2014-01-24 HISTORY — DX: Type 2 diabetes mellitus without complications: E11.9

## 2014-01-24 HISTORY — DX: Pneumonia, unspecified organism: J18.9

## 2014-01-24 HISTORY — DX: Dependence on supplemental oxygen: Z99.81

## 2014-01-24 HISTORY — DX: Dependence on other enabling machines and devices: Z99.89

## 2014-01-24 HISTORY — DX: Unspecified chronic bronchitis: J42

## 2014-01-24 LAB — CBC WITH DIFFERENTIAL/PLATELET
BASOS ABS: 0 10*3/uL (ref 0.0–0.1)
BASOS PCT: 0 % (ref 0–1)
Eosinophils Absolute: 0.3 10*3/uL (ref 0.0–0.7)
Eosinophils Relative: 3 % (ref 0–5)
HEMATOCRIT: 41 % (ref 36.0–46.0)
Hemoglobin: 13.5 g/dL (ref 12.0–15.0)
LYMPHS PCT: 18 % (ref 12–46)
Lymphs Abs: 1.5 10*3/uL (ref 0.7–4.0)
MCH: 28.1 pg (ref 26.0–34.0)
MCHC: 32.9 g/dL (ref 30.0–36.0)
MCV: 85.2 fL (ref 78.0–100.0)
MONO ABS: 0.7 10*3/uL (ref 0.1–1.0)
Monocytes Relative: 8 % (ref 3–12)
NEUTROS ABS: 5.9 10*3/uL (ref 1.7–7.7)
NEUTROS PCT: 70 % (ref 43–77)
Platelets: 183 10*3/uL (ref 150–400)
RBC: 4.81 MIL/uL (ref 3.87–5.11)
RDW: 16.8 % — ABNORMAL HIGH (ref 11.5–15.5)
WBC: 8.4 10*3/uL (ref 4.0–10.5)

## 2014-01-24 LAB — BASIC METABOLIC PANEL
BUN: 28 mg/dL — ABNORMAL HIGH (ref 6–23)
CO2: 30 meq/L (ref 19–32)
CREATININE: 0.91 mg/dL (ref 0.50–1.10)
Calcium: 9.6 mg/dL (ref 8.4–10.5)
Chloride: 97 mEq/L (ref 96–112)
GFR calc non Af Amer: 61 mL/min — ABNORMAL LOW (ref >90)
GFR, EST AFRICAN AMERICAN: 70 mL/min — AB (ref >90)
Glucose, Bld: 231 mg/dL — ABNORMAL HIGH (ref 70–99)
Potassium: 4.3 mEq/L (ref 3.7–5.3)
SODIUM: 141 meq/L (ref 137–147)

## 2014-01-24 LAB — GLUCOSE, CAPILLARY: Glucose-Capillary: 193 mg/dL — ABNORMAL HIGH (ref 70–99)

## 2014-01-24 LAB — PRO B NATRIURETIC PEPTIDE: PRO B NATRI PEPTIDE: 665.4 pg/mL — AB (ref 0–125)

## 2014-01-24 MED ORDER — POLYETHYLENE GLYCOL 3350 17 G PO PACK
17.0000 g | PACK | Freq: Every day | ORAL | Status: DC
  Administered 2014-01-25 – 2014-01-30 (×6): 17 g via ORAL
  Filled 2014-01-24 (×6): qty 1

## 2014-01-24 MED ORDER — ALBUTEROL SULFATE HFA 108 (90 BASE) MCG/ACT IN AERS: 2.0000 | INHALATION_SPRAY | Freq: Four times a day (QID) | RESPIRATORY_TRACT | Status: DC | PRN

## 2014-01-24 MED ORDER — SODIUM CHLORIDE 0.9 % IV SOLN: 250.0000 mL | INTRAVENOUS | Status: DC | PRN

## 2014-01-24 MED ORDER — SODIUM CHLORIDE 0.9 % IJ SOLN: 3.0000 mL | INTRAMUSCULAR | Status: DC | PRN

## 2014-01-24 MED ORDER — MOMETASONE FURO-FORMOTEROL FUM 100-5 MCG/ACT IN AERO
2.0000 | INHALATION_SPRAY | Freq: Two times a day (BID) | RESPIRATORY_TRACT | Status: DC
  Administered 2014-01-25 – 2014-01-30 (×11): 2 via RESPIRATORY_TRACT
  Filled 2014-01-24: qty 8.8

## 2014-01-24 MED ORDER — COLCHICINE 0.6 MG PO TABS
0.6000 mg | ORAL_TABLET | Freq: Two times a day (BID) | ORAL | Status: DC
  Administered 2014-01-24 – 2014-01-30 (×12): 0.6 mg via ORAL
  Filled 2014-01-24 (×13): qty 1

## 2014-01-24 MED ORDER — POTASSIUM CHLORIDE CRYS ER 20 MEQ PO TBCR
20.0000 meq | EXTENDED_RELEASE_TABLET | Freq: Two times a day (BID) | ORAL | Status: DC
  Administered 2014-01-24 – 2014-01-25 (×3): 20 meq via ORAL
  Filled 2014-01-24 (×5): qty 1

## 2014-01-24 MED ORDER — ONDANSETRON HCL 4 MG PO TABS: 4.0000 mg | ORAL_TABLET | Freq: Three times a day (TID) | ORAL | Status: DC | PRN

## 2014-01-24 MED ORDER — DILTIAZEM HCL ER COATED BEADS 240 MG PO CP24
240.0000 mg | ORAL_CAPSULE | Freq: Every day | ORAL | Status: DC
  Administered 2014-01-25 – 2014-01-30 (×6): 240 mg via ORAL
  Filled 2014-01-24 (×6): qty 1

## 2014-01-24 MED ORDER — ALLOPURINOL 100 MG PO TABS
200.0000 mg | ORAL_TABLET | Freq: Every day | ORAL | Status: DC
  Administered 2014-01-25 – 2014-01-30 (×6): 200 mg via ORAL
  Filled 2014-01-24 (×6): qty 2

## 2014-01-24 MED ORDER — INSULIN ASPART 100 UNIT/ML ~~LOC~~ SOLN
0.0000 [IU] | Freq: Three times a day (TID) | SUBCUTANEOUS | Status: DC
  Administered 2014-01-25: 2 [IU] via SUBCUTANEOUS
  Administered 2014-01-25: 5 [IU] via SUBCUTANEOUS
  Administered 2014-01-25: 3 [IU] via SUBCUTANEOUS
  Administered 2014-01-26: 2 [IU] via SUBCUTANEOUS
  Administered 2014-01-26 – 2014-01-27 (×3): 5 [IU] via SUBCUTANEOUS
  Administered 2014-01-27 (×2): 2 [IU] via SUBCUTANEOUS
  Administered 2014-01-28 (×2): 3 [IU] via SUBCUTANEOUS
  Administered 2014-01-29: 2 [IU] via SUBCUTANEOUS
  Administered 2014-01-30: 3 [IU] via SUBCUTANEOUS

## 2014-01-24 MED ORDER — FUROSEMIDE 10 MG/ML IJ SOLN
8.0000 mg/h | INTRAVENOUS | Status: DC
  Administered 2014-01-24: 8 mg/h via INTRAVENOUS
  Filled 2014-01-24 (×4): qty 25

## 2014-01-24 MED ORDER — APIXABAN 5 MG PO TABS
5.0000 mg | ORAL_TABLET | Freq: Two times a day (BID) | ORAL | Status: DC
  Administered 2014-01-24 – 2014-01-30 (×12): 5 mg via ORAL
  Filled 2014-01-24 (×13): qty 1

## 2014-01-24 MED ORDER — INSULIN GLARGINE 100 UNIT/ML ~~LOC~~ SOLN
28.0000 [IU] | Freq: Every day | SUBCUTANEOUS | Status: DC
  Administered 2014-01-24 – 2014-01-29 (×6): 28 [IU] via SUBCUTANEOUS
  Filled 2014-01-24 (×7): qty 0.28

## 2014-01-24 MED ORDER — ALBUTEROL SULFATE (2.5 MG/3ML) 0.083% IN NEBU
2.5000 mg | INHALATION_SOLUTION | Freq: Four times a day (QID) | RESPIRATORY_TRACT | Status: DC | PRN
  Filled 2014-01-24: qty 3

## 2014-01-24 MED ORDER — LORAZEPAM 0.5 MG PO TABS
0.5000 mg | ORAL_TABLET | Freq: Every day | ORAL | Status: DC
  Administered 2014-01-24 – 2014-01-29 (×6): 0.5 mg via ORAL
  Filled 2014-01-24 (×6): qty 1

## 2014-01-24 MED ORDER — VITAMIN C 500 MG PO TABS
1000.0000 mg | ORAL_TABLET | Freq: Every day | ORAL | Status: DC
  Administered 2014-01-25 – 2014-01-30 (×6): 1000 mg via ORAL
  Filled 2014-01-24 (×6): qty 2

## 2014-01-24 MED ORDER — MECLIZINE HCL 12.5 MG PO TABS
6.2500 mg | ORAL_TABLET | Freq: Three times a day (TID) | ORAL | Status: DC | PRN
  Filled 2014-01-24: qty 1

## 2014-01-24 MED ORDER — IPRATROPIUM BROMIDE 0.02 % IN SOLN: 0.5000 mg | Freq: Four times a day (QID) | RESPIRATORY_TRACT | Status: DC

## 2014-01-24 MED ORDER — IPRATROPIUM BROMIDE 0.02 % IN SOLN
0.5000 mg | Freq: Four times a day (QID) | RESPIRATORY_TRACT | Status: DC
  Administered 2014-01-24 – 2014-01-26 (×7): 0.5 mg via RESPIRATORY_TRACT
  Filled 2014-01-24 (×9): qty 2.5

## 2014-01-24 MED ORDER — SIMVASTATIN 40 MG PO TABS
40.0000 mg | ORAL_TABLET | Freq: Every day | ORAL | Status: DC
  Administered 2014-01-24 – 2014-01-26 (×3): 40 mg via ORAL
  Filled 2014-01-24 (×5): qty 1

## 2014-01-24 MED ORDER — SODIUM CHLORIDE 0.9 % IJ SOLN
3.0000 mL | Freq: Two times a day (BID) | INTRAMUSCULAR | Status: DC
  Administered 2014-01-25 – 2014-01-29 (×7): 3 mL via INTRAVENOUS

## 2014-01-24 MED ORDER — FERROUS FUMARATE 325 (106 FE) MG PO TABS
1.0000 | ORAL_TABLET | Freq: Two times a day (BID) | ORAL | Status: DC
  Administered 2014-01-24 – 2014-01-30 (×12): 106 mg via ORAL
  Filled 2014-01-24 (×16): qty 1

## 2014-01-24 MED ORDER — EXEMESTANE 25 MG PO TABS
25.0000 mg | ORAL_TABLET | Freq: Every day | ORAL | Status: DC
  Administered 2014-01-25 – 2014-01-30 (×6): 25 mg via ORAL
  Filled 2014-01-24 (×8): qty 1

## 2014-01-24 MED ORDER — LOSARTAN POTASSIUM 50 MG PO TABS
50.0000 mg | ORAL_TABLET | Freq: Every day | ORAL | Status: DC
  Administered 2014-01-25 – 2014-01-30 (×6): 50 mg via ORAL
  Filled 2014-01-24 (×6): qty 1

## 2014-01-24 NOTE — H&P (Addendum)
Admission H&P  Chief Complaint:  Shortness of breath   Primary Care Physician:  Shirline Frees, MD   HPI:  [name] is a 73 year old female who is morbidly obese and recently had bilateral knee replacement. She also has a history of dyslipidemia, hypertension, sleep apnea, diabetes type 2, sarcoidosis, paroxysmal A-fib on Eliquis. Unfortunately, she also had breast cancer which I understand she is now in remission for. She was recently admitted to the hospital from skilled nursing facility as she was found to be bradycardic with HR in the 40s. Per pt, this has been associated with dyspnea on exertion and at rest, 2-3 pillow orthopnea, progressively worsening LE swelling. Her pulmonologist increased the dose of Lasix to 40 mg BID but she has not started this piror to this admission. She was found to be in acute diastolic heart failure and was diureses. Her breathing had improved however she was having persistent cough and she was on an ACE inhibitor which was changed to an ARB. She improved fairly quickly with diuresis and was discharged with a weight of 244 pounds. Her weight on hospital follow-up was 236 pounds. She has chronic dyspnea which is not any worse than it was when she was discharged.   Was recently contacted by home health who noted that she's had progressive increase in her weight over the past 2 months. Specifically, over the last week she's had an increase in her weight now up to 260 pounds. This is associated with marked increase in shortness of breath and abdominal fullness with difficulty breathing. She's had progressively increasing doses of Lasix were divided over the past week including increased to 80 mg in the morning and 40 mg at night and most recently 80 mg twice daily, however she did not report an increase in urination and her weight continues to increase.   PMHx:  Past Medical History   Diagnosis  Date   .  Diabetes mellitus      type 2   .  Asthma    .   Bronchitis    .  Hernia    .  Anemia    .  Uterine cancer      s/p hysterectomy   .  Arthritis    .  Sarcoidosis    .  Morbid obesity    .  Sarcoid    .  OSA (obstructive sleep apnea)      Uses nasal CPAP Q HS   .  Dyslipidemia    .  Breast cancer, stage 1  10/17/2011     s/p lumpectomy and XRT   .  Cough    .  Wheezing    .  Chills    .  Constipation    .  Bruises easily    .  Gout attack      ankle, then wrist and hands   .  Hypertension    .  Atrial fibrillation, persistent  11/01/2013   .  Chronic anticoagulation, on Eliquis  11/01/2013   .  History of nuclear stress test  02/09/2010     dipyridamole; normal pattern of perfusion in all regions with attenuation artifact in inferior region, low risk    Past Surgical History   Procedure  Laterality  Date   .  Total knee arthroplasty  Bilateral  2001   .  Abdominal hysterectomy   [id number]     umbilical hernia repair @ same time   .  Uterine surgery - resection  r/t cancer   .  Left lower leg fx--casted     .  Breast lumpectomy  Right    .  Transthoracic echocardiogram   07/2008     EF, LV size is normal; RVSP normal; mod calcif of MV apparatus    FAMHx:  Family History   Problem  Relation  Age of Onset   .  Diabetes  Mother    .  Colon cancer  Father    .  Diabetes  Brother    .  Lung cancer  Brother    .  Diabetes  Sister    .  Cervical cancer  Sister     SOCHx:  reports that she has never smoked. She has never used smokeless tobacco. She reports that she does not drink alcohol or use illicit drugs.   ALLERGIES:  Allergies   Allergen  Reactions   .  Aspirin      Avoids due to being on blood thinners   .  Contrast Media [Iodinated Diagnostic Agents]  Nausea And Vomiting   .  Ibuprofen      Avoids due to being on blood thinners   .  Pravachol  Other (See Comments)     Muscle pain   .  Pravastatin Sodium     ROS:  A comprehensive review of systems was negative except for: Constitutional: positive for  weight gain  Respiratory: positive for dyspnea on exertion  Cardiovascular: positive for orthopnea and paroxysmal nocturnal dyspnea   HOME MEDS:  Current Outpatient Prescriptions   Medication  Sig  Dispense  Refill   .  acetaminophen (TYLENOL) 500 MG tablet  Take 1,000 mg by mouth every 6 (six) hours as needed. pain     .  albuterol (PROVENTIL HFA) 108 (90 BASE) MCG/ACT inhaler  Inhale 2 puffs into the lungs every 6 (six) hours as needed for wheezing.     Marland Kitchen  allopurinol (ZYLOPRIM) 100 MG tablet  Take 200 mg by mouth daily.     Marland Kitchen  apixaban (ELIQUIS) 5 MG TABS tablet  Take 1 tablet (5 mg total) by mouth 2 (two) times daily.  60 tablet  6   .  Ascorbic Acid (VITAMIN C) 1000 MG tablet  Take 1,000 mg by mouth daily.     Marland Kitchen  CALCIUM PO  Take by mouth 2 (two) times daily.     .  Cholecalciferol (VITAMIN D-3 PO)  Take by mouth daily.     .  colchicine 0.6 MG tablet  Take 1 tablet (0.6 mg total) by mouth 2 (two) times daily.  60 tablet  0   .  diltiazem (CARDIZEM CD) 240 MG 24 hr capsule  Take 1 capsule by mouth daily.     Marland Kitchen  exemestane (AROMASIN) 25 MG tablet  Take 1 tablet (25 mg total) by mouth daily after breakfast.  30 tablet  12   .  ferrous fumarate (HEMOCYTE - 106 MG FE) 325 (106 FE) MG TABS  Take 1 tablet by mouth 2 (two) times daily.     .  Fluticasone-Salmeterol (ADVAIR) 250-50 MCG/DOSE AEPB  Inhale 1 puff into the lungs 2 (two) times daily.     .  furosemide (LASIX) 40 MG tablet  Take 1 tablet (40 mg total) by mouth 2 (two) times daily.  60 tablet  0   .  guaiFENesin (MUCINEX) 600 MG 12 hr tablet  Take 1,200 mg by mouth 2 (two) times daily.     Marland Kitchen  HUMULIN N 100 UNIT/ML injection  Inject 5 Units into the skin as needed (5 units if blood sugar is greater than 150).     .  insulin glargine (LANTUS) 100 UNIT/ML injection  Inject 0.28 mLs (28 Units total) into the skin at bedtime.  10 mL  12   .  ipratropium (ATROVENT) 0.02 % nebulizer solution  Take 2.5 mLs (0.5 mg total) by nebulization every 6  (six) hours.  75 mL  0   .  LORazepam (ATIVAN) 0.5 MG tablet  Take one tablet by mouth every night at bedtime as needed for anxiety  30 tablet  0   .  losartan (COZAAR) 50 MG tablet  Take 1 tablet by mouth daily.     .  meclizine (ANTIVERT) 25 MG tablet  Take 6.25-12.5 mg by mouth 3 (three) times daily as needed. For dizziness.     .  metFORMIN (GLUCOPHAGE) 500 MG tablet  Take 500 mg by mouth 2 (two) times daily with a meal. If cbg is greater than 120 pt takes medication     .  Multiple Vitamin (MULTIVITAMIN WITH MINERALS) TABS  Take 1 tablet by mouth daily.     .  ondansetron (ZOFRAN) 4 MG tablet  Take 4 mg by mouth every 8 (eight) hours as needed for nausea.     .  polyethylene glycol (MIRALAX / GLYCOLAX) packet  Take 17 g by mouth daily.  14 each  0   .  potassium chloride SA (K-DUR,KLOR-CON) 20 MEQ tablet  Take 1 tablet (20 mEq total) by mouth daily.  30 tablet  0   .  simvastatin (ZOCOR) 40 MG tablet  Take 40 mg by mouth at bedtime.      No current facility-administered medications for this visit.    LABS/IMAGING:  No results found for this or any previous visit (from the past 48 hour(s)).  No results found.   VITALS:  BP 132/70  Pulse 76  Ht 4\' 11"  (1.499 m)  Wt 260 lb 14.4 oz (118.343 kg)  BMI 52.67 kg/m2   EXAM:  General appearance: alert, mild distress, morbidly obese and shallow breathing  Neck: JVD - 3 cm above sternal notch and no carotid bruit  Lungs: diminished breath sounds bibasilar and bilaterally  Heart: irregularly irregular rhythm  Abdomen: soft, non-tender; bowel sounds normal; no masses, no organomegaly and obese, protuberant with a fluid wave  Extremities: compression stockings in place, trace to 1+ edema  Pulses: 2+ and symmetric  Skin: Skin color, texture, turgor normal. No rashes or lesions  Neurologic: Grossly normal  Psych: Mild respiratory distress   EKG:  deferred   ASSESSMENT:  1. Acute on chronic diastolic congestive heart failure, NYHA Class IV  symptoms 2. Persistent atrial fibrillation on Eliquis 3. Hypertension 4. Diabetes type 2 - on insulin 5. Morbid obesity 6. Obstructive sleep apnea 7. Sarcoidosis 8. Dyslipidemia  PLAN:  1. Mrs. Faxon has now had 20-25 pound weight gain over the past month most recently in the last few weeks, despite increasing doses of Lasix she is not urinating. I suspect she has bowel edema and is not absorbing her diuretics. At this point, she will likely require parenteral diuretics. I am recommending admission for IV diuretics. Her goal dry weight is somewhere around 235 to 240 pounds. Will arrange for direct admission to [place].   Pixie Casino, MD, [facility]  Attending Cardiologist  [place]

## 2014-01-24 NOTE — Patient Instructions (Addendum)
Dr. Debara Pickett would like you to be admitted to [facility] to "Admitting" and they will send you to [facility].

## 2014-01-24 NOTE — Progress Notes (Signed)
Pt arrived as direct admit . Oriented to room. VS done and placed on telemetry monitor.

## 2014-01-24 NOTE — Progress Notes (Signed)
OFFICE NOTE  Chief Complaint:  Shortness of breath  Primary Care Physician: Shirline Frees, MD  HPI:  [name] is a 73 year old female who is morbidly obese and recently had bilateral knee replacement. She also has a history of dyslipidemia, hypertension, sleep apnea, diabetes type 2, sarcoidosis, paroxysmal A-fib on Eliquis.  Unfortunately, she also had breast cancer which I understand she is now in remission for. She was recently admitted to the hospital from skilled nursing facility as she was found to be bradycardic with HR in the 40s. Per pt, this has been associated with dyspnea on exertion and at rest, 2-3 pillow orthopnea, progressively worsening LE swelling. Her pulmonologist increased the dose of Lasix to 40 mg BID but she has not started this piror to this admission. She was found to be in acute diastolic heart failure and was diureses. Her breathing had improved however she was having persistent cough and she was on an ACE inhibitor which was changed to an ARB. She improved fairly quickly with diuresis and was discharged with a weight of 244 pounds. Her weight on hospital follow-up was 236 pounds.  She has chronic dyspnea which is not any worse than it was when she was discharged.  Was recently contacted by home health who noted that she's had progressive increase in her weight over the past 2 months. Specifically, over the last week she's had an increase in her weight now up to 260 pounds. This is associated with marked increase in shortness of breath and abdominal fullness with difficulty breathing. She's had progressively increasing doses of Lasix were divided over the past week including increased to 80 mg in the morning and 40 mg at night and most recently 80 mg twice daily, however she did not report an increase in urination and her weight continues to increase.  PMHx:  Past Medical History  Diagnosis Date  . Diabetes mellitus     type 2  . Asthma   . Bronchitis     . Hernia   . Anemia   . Uterine cancer     s/p hysterectomy  . Arthritis   . Sarcoidosis   . Morbid obesity   . Sarcoid   . OSA (obstructive sleep apnea)     Uses nasal CPAP Q HS  . Dyslipidemia   . Breast cancer, stage 1 10/17/2011    s/p lumpectomy and XRT  . Cough   . Wheezing   . Chills   . Constipation   . Bruises easily   . Gout attack     ankle, then wrist and hands  . Hypertension   . Atrial fibrillation, persistent 11/01/2013  . Chronic anticoagulation, on Eliquis 11/01/2013  . History of nuclear stress test 02/09/2010    dipyridamole; normal pattern of perfusion in all regions with attenuation artifact in inferior region, low risk     Past Surgical History  Procedure Laterality Date  . Total knee arthroplasty Bilateral 2001  . Abdominal hysterectomy  3/55/7322    umbilical hernia repair @ same time  . Uterine surgery - resection      r/t cancer  . Left lower leg fx--casted    . Breast lumpectomy Right   . Transthoracic echocardiogram  07/2008    EF, LV size is normal; RVSP normal; mod calcif of MV apparatus    FAMHx:  Family History  Problem Relation Age of Onset  . Diabetes Mother   . Colon cancer Father   . Diabetes Brother   .  Lung cancer Brother   . Diabetes Sister   . Cervical cancer Sister     SOCHx:   reports that she has never smoked. She has never used smokeless tobacco. She reports that she does not drink alcohol or use illicit drugs.  ALLERGIES:  Allergies  Allergen Reactions  . Aspirin     Avoids due to being on blood thinners  . Contrast Media [Iodinated Diagnostic Agents] Nausea And Vomiting  . Ibuprofen     Avoids due to being on blood thinners  . Pravachol Other (See Comments)    Muscle pain  . Pravastatin Sodium     ROS: A comprehensive review of systems was negative except for: Constitutional: positive for weight gain Respiratory: positive for dyspnea on exertion Cardiovascular: positive for orthopnea and paroxysmal  nocturnal dyspnea  HOME MEDS: Current Outpatient Prescriptions  Medication Sig Dispense Refill  . acetaminophen (TYLENOL) 500 MG tablet Take 1,000 mg by mouth every 6 (six) hours as needed. pain      . albuterol (PROVENTIL HFA) 108 (90 BASE) MCG/ACT inhaler Inhale 2 puffs into the lungs every 6 (six) hours as needed for wheezing.      Marland Kitchen allopurinol (ZYLOPRIM) 100 MG tablet Take 200 mg by mouth daily.       Marland Kitchen apixaban (ELIQUIS) 5 MG TABS tablet Take 1 tablet (5 mg total) by mouth 2 (two) times daily.  60 tablet  6  . Ascorbic Acid (VITAMIN C) 1000 MG tablet Take 1,000 mg by mouth daily.      Marland Kitchen CALCIUM PO Take by mouth 2 (two) times daily.      . Cholecalciferol (VITAMIN D-3 PO) Take by mouth daily.      . colchicine 0.6 MG tablet Take 1 tablet (0.6 mg total) by mouth 2 (two) times daily.  60 tablet  0  . diltiazem (CARDIZEM CD) 240 MG 24 hr capsule Take 1 capsule by mouth daily.      Marland Kitchen exemestane (AROMASIN) 25 MG tablet Take 1 tablet (25 mg total) by mouth daily after breakfast.  30 tablet  12  . ferrous fumarate (HEMOCYTE - 106 MG FE) 325 (106 FE) MG TABS Take 1 tablet by mouth 2 (two) times daily.       . Fluticasone-Salmeterol (ADVAIR) 250-50 MCG/DOSE AEPB Inhale 1 puff into the lungs 2 (two) times daily.      . furosemide (LASIX) 40 MG tablet Take 1 tablet (40 mg total) by mouth 2 (two) times daily.  60 tablet  0  . guaiFENesin (MUCINEX) 600 MG 12 hr tablet Take 1,200 mg by mouth 2 (two) times daily.      Marland Kitchen HUMULIN N 100 UNIT/ML injection Inject 5 Units into the skin as needed (5 units if blood sugar is greater than 150).       . insulin glargine (LANTUS) 100 UNIT/ML injection Inject 0.28 mLs (28 Units total) into the skin at bedtime.  10 mL  12  . ipratropium (ATROVENT) 0.02 % nebulizer solution Take 2.5 mLs (0.5 mg total) by nebulization every 6 (six) hours.  75 mL  0  . LORazepam (ATIVAN) 0.5 MG tablet Take one tablet by mouth every night at bedtime as needed for anxiety  30 tablet  0  .  losartan (COZAAR) 50 MG tablet Take 1 tablet by mouth daily.      . meclizine (ANTIVERT) 25 MG tablet Take 6.25-12.5 mg by mouth 3 (three) times daily as needed. For dizziness.      Marland Kitchen  metFORMIN (GLUCOPHAGE) 500 MG tablet Take 500 mg by mouth 2 (two) times daily with a meal. If cbg is greater than 120 pt takes medication      . Multiple Vitamin (MULTIVITAMIN WITH MINERALS) TABS Take 1 tablet by mouth daily.      . ondansetron (ZOFRAN) 4 MG tablet Take 4 mg by mouth every 8 (eight) hours as needed for nausea.      . polyethylene glycol (MIRALAX / GLYCOLAX) packet Take 17 g by mouth daily.  14 each  0  . potassium chloride SA (K-DUR,KLOR-CON) 20 MEQ tablet Take 1 tablet (20 mEq total) by mouth daily.  30 tablet  0  . simvastatin (ZOCOR) 40 MG tablet Take 40 mg by mouth at bedtime.       No current facility-administered medications for this visit.    LABS/IMAGING: No results found for this or any previous visit (from the past 48 hour(s)). No results found.  VITALS: BP 132/70  Pulse 76  Ht 4\' 11"  (1.499 m)  Wt 260 lb 14.4 oz (118.343 kg)  BMI 52.67 kg/m2  EXAM: General appearance: alert, mild distress, morbidly obese and shallow breathing Neck: JVD - 3 cm above sternal notch and no carotid bruit Lungs: diminished breath sounds bibasilar and bilaterally Heart: irregularly irregular rhythm Abdomen: soft, non-tender; bowel sounds normal; no masses,  no organomegaly and obese, protuberant with a fluid wave Extremities: compression stockings in place, trace to 1+ edema Pulses: 2+ and symmetric Skin: Skin color, texture, turgor normal. No rashes or lesions Neurologic: Grossly normal Psych: Mild respiratory distress  EKG: deferred  ASSESSMENT: 1. Acute on chronic diastolic congestive heart failure, NYHA Class IV symptoms 2. Persistent atrial fibrillation on Eliquis 3. Hypertension 4. Diabetes type 2 - on insulin 5. Morbid obesity 6. Obstructive sleep  apnea 7. Sarcoidosis 8. Dyslipidemia  PLAN: 1.   Mrs. Hiles has now had 20-25 pound weight gain over the past month most recently in the last few weeks, despite increasing doses of Lasix she is not urinating. I suspect she has bowel edema and is not absorbing her diuretics. At this point, she will likely require parenteral diuretics. I am recommending admission for IV diuretics. Her goal dry weight is somewhere around 235 to 240 pounds.  Will arrange for direct admission to [place].  Pixie Casino, MD, [facility] Attending Cardiologist [facility]  HILTY,Kenneth C 01/24/2014, 3:05 PM

## 2014-01-24 NOTE — Addendum Note (Signed)
Addended by: Pixie Casino on: 01/24/2014 03:13 PM   Modules accepted: Level of Service

## 2014-01-25 DIAGNOSIS — I5033 Acute on chronic diastolic (congestive) heart failure: Principal | ICD-10-CM

## 2014-01-25 DIAGNOSIS — I4891 Unspecified atrial fibrillation: Secondary | ICD-10-CM

## 2014-01-25 LAB — BASIC METABOLIC PANEL
BUN: 34 mg/dL — ABNORMAL HIGH (ref 6–23)
CHLORIDE: 96 meq/L (ref 96–112)
CO2: 29 mEq/L (ref 19–32)
Calcium: 9.5 mg/dL (ref 8.4–10.5)
Creatinine, Ser: 0.83 mg/dL (ref 0.50–1.10)
GFR calc non Af Amer: 68 mL/min — ABNORMAL LOW (ref >90)
GFR, EST AFRICAN AMERICAN: 79 mL/min — AB (ref >90)
Glucose, Bld: 137 mg/dL — ABNORMAL HIGH (ref 70–99)
POTASSIUM: 3.9 meq/L (ref 3.7–5.3)
SODIUM: 139 meq/L (ref 137–147)

## 2014-01-25 LAB — GLUCOSE, CAPILLARY
GLUCOSE-CAPILLARY: 134 mg/dL — AB (ref 70–99)
GLUCOSE-CAPILLARY: 168 mg/dL — AB (ref 70–99)
Glucose-Capillary: 226 mg/dL — ABNORMAL HIGH (ref 70–99)
Glucose-Capillary: 164 mg/dL — ABNORMAL HIGH (ref 70–99)

## 2014-01-25 MED ORDER — ACETAMINOPHEN 325 MG PO TABS
650.0000 mg | ORAL_TABLET | Freq: Four times a day (QID) | ORAL | Status: DC | PRN
  Administered 2014-01-25 – 2014-01-29 (×7): 650 mg via ORAL
  Filled 2014-01-25 (×7): qty 2

## 2014-01-25 NOTE — Progress Notes (Addendum)
Subjective: [name] is a 73 year old female who is morbidly obese and recently had bilateral knee replacement. She also has a history of dyslipidemia, hypertension, sleep apnea, diabetes type 2, sarcoidosis, paroxysmal A-fib on Eliquis,  breast cancer ( in remission)  She was recently admitted to the hospital from skilled nursing facility as she was found to be bradycardic with HR in the 40s. Per pt, this has been associated with dyspnea on exertion and at rest, 2-3 pillow orthopnea, progressively worsening LE swelling. Her pulmonologist increased the dose of Lasix to 40 mg BID but she has not started this piror to this admission. She was found to be in acute diastolic heart failure and was diureses. Her breathing had improved however she was having persistent cough and she was on an ACE inhibitor which was changed to an ARB. She improved fairly quickly with diuresis and was discharged with a weight of 244 pounds. Her weight on hospital follow-up was 236 pounds. She has chronic dyspnea which is not any worse than it was when she was discharged.    home health who noted that she's had progressive increase in her weight over the past 2 months. Specifically, over the last week she's had an increase in her weight now up to 260 pounds. This is associated with marked increase in shortness of breath and abdominal fullness with difficulty breathing. She's had progressively increasing doses of Lasix were divided over the past week including increased to 80 mg in the morning and 40 mg at night and most recently 80 mg twice daily, however she did not report an increase in urination and her weight continues to increase.   She has diuresed some since admission.  Weight is not changed yet.  Echo: 11/01/13: Left ventricle: The cavity size was normal. Wall thickness was normal. Systolic function was normal. The estimated ejection fraction was in the range of 55% to 60%. Wall motion was normal; there were no  regional wall motion abnormalities. Doppler parameters are consistent with elevated mean left atrial filling pressure. - Mitral valve: Calcified annulus. Mild regurgitation. Valve area by continuity equation (using LVOT flow): 2.53cm^2. - Left atrium: The atrium was moderately dilated. - Right ventricle: The cavity size was moderately dilated. - Right atrium: The atrium was mildly dilated. - Pulmonary arteries: Systolic pressure was mildly increased. PA peak pressure: 51mm Hg (S). - Pericardium, extracardiac: A trivial pericardial effusion    Objective: Vital signs in last 24 hours: Temp:  [97.3 F (36.3 C)-98.1 F (36.7 C)] 97.7 F (36.5 C) (02/14 0356) Pulse Rate:  [63-76] 71 (02/14 0356) Resp:  [18-22] 18 (02/14 0356) BP: (107-132)/(54-70) 117/65 mmHg (02/14 0356) SpO2:  [95 %-99 %] 97 % (02/14 0356) Weight:  [259 lb 4.8 oz (117.618 kg)-261 lb 0.4 oz (118.4 kg)] 261 lb 0.4 oz (118.4 kg) (02/14 0320) Last BM Date: 01/24/14  Intake/Output from previous day: 02/13 0701 - 02/14 0700 In: 300 [P.O.:300] Out: 3001 [Urine:3000; Stool:1] Intake/Output this shift: Total I/O In: -  Out: 400 [Urine:400]  Medications Current Facility-Administered Medications  Medication Dose Route Frequency Provider Last Rate Last Dose  . 0.9 %  sodium chloride infusion  250 mL Intravenous PRN Tarri Fuller, PA-C      . acetaminophen (TYLENOL) tablet 650 mg  650 mg Oral Q6H PRN Pixie Casino, MD   650 mg at 01/25/14 0156  . albuterol (PROVENTIL) (2.5 MG/3ML) 0.083% nebulizer solution 2.5 mg  2.5 mg Nebulization Q6H PRN Pixie Casino, MD      .  allopurinol (ZYLOPRIM) tablet 200 mg  200 mg Oral Daily Tarri Fuller, PA-C      . apixaban (ELIQUIS) tablet 5 mg  5 mg Oral BID Tarri Fuller, PA-C   5 mg at 01/24/14 2300  . colchicine tablet 0.6 mg  0.6 mg Oral BID Tarri Fuller, PA-C   0.6 mg at 01/24/14 2300  . diltiazem (CARDIZEM CD) 24 hr capsule 240 mg  240 mg Oral Daily Tarri Fuller, PA-C      .  exemestane (AROMASIN) tablet 25 mg  25 mg Oral QPC breakfast Tarri Fuller, PA-C      . ferrous fumarate (HEMOCYTE - 106 mg FE) tablet 106 mg of iron  1 tablet Oral BID Tarri Fuller, PA-C   106 mg of iron at 01/24/14 2353  . furosemide (LASIX) 250 mg in dextrose 5 % 250 mL infusion  8 mg/hr Intravenous Continuous Tarri Fuller, PA-C 8 mL/hr at 01/24/14 2117 8 mg/hr at 01/24/14 2117  . insulin aspart (novoLOG) injection 0-15 Units  0-15 Units Subcutaneous TID WC Tarri Fuller, PA-C   2 Units at 01/25/14 0759  . insulin glargine (LANTUS) injection 28 Units  28 Units Subcutaneous QHS Tarri Fuller, PA-C   28 Units at 01/24/14 2300  . ipratropium (ATROVENT) nebulizer solution 0.5 mg  0.5 mg Nebulization Q6H Pixie Casino, MD   0.5 mg at 01/25/14 0204  . LORazepam (ATIVAN) tablet 0.5 mg  0.5 mg Oral QHS Tarri Fuller, PA-C   0.5 mg at 01/24/14 2308  . losartan (COZAAR) tablet 50 mg  50 mg Oral Daily Tarri Fuller, PA-C      . meclizine (ANTIVERT) tablet 6.25-12.5 mg  6.25-12.5 mg Oral TID PRN Tarri Fuller, PA-C      . mometasone-formoterol (DULERA) 100-5 MCG/ACT inhaler 2 puff  2 puff Inhalation BID Tarri Fuller, PA-C      . ondansetron [facility]) tablet 4 mg  4 mg Oral Q8H PRN Tarri Fuller, PA-C      . polyethylene glycol (MIRALAX / GLYCOLAX) packet 17 g  17 g Oral Daily Tarri Fuller, PA-C      . potassium chloride SA (K-DUR,KLOR-CON) CR tablet 20 mEq  20 mEq Oral BID Tarri Fuller, PA-C   20 mEq at 01/24/14 2307  . simvastatin (ZOCOR) tablet 40 mg  40 mg Oral QHS Tarri Fuller, PA-C   40 mg at 01/24/14 2351  . sodium chloride 0.9 % injection 3 mL  3 mL Intravenous Q12H Bryan Hager, PA-C      . sodium chloride 0.9 % injection 3 mL  3 mL Intravenous PRN Tarri Fuller, PA-C      . vitamin C (ASCORBIC ACID) tablet 1,000 mg  1,000 mg Oral Daily Tarri Fuller, PA-C        PE: General appearance: alert, cooperative, no distress and morbidly  obese Lungs: bibasilar rales Heart: irregularly irregular rhythm Extremities: no  edema.  Left lower leg is larger than right leg , varicose veins Pulses: 2+ and symmetric Skin: warm and dry Neurologic: Grossly normal  Lab Results:   Recent Labs  01/24/14 1945  WBC 8.4  HGB 13.5  HCT 41.0  PLT 183   BMET  Recent Labs  01/24/14 1945 01/25/14 0457  NA 141 139  K 4.3 3.9  CL 97 96  CO2 30 29  GLUCOSE 231* 137*  BUN 28* 34*  CREATININE 0.91 0.83  CALCIUM 9.6 9.5   BNP (last 3 results)  Recent Labs  10/31/13 2210 11/07/13 0425 01/24/14 1945  PROBNP 2189.0* 802.3* 665.4*   Filed Weights   01/24/14 1716 01/25/14 0320  Weight: 259 lb 4.8 oz (117.618 kg) 261 lb 0.4 oz (118.4 kg)     Assessment/Plan  Active Problems:   PULMONARY SARCOIDOSIS   Obstructive sleep apnea   Hyperlipidemia   Acute on chronic diastolic CHF (congestive heart failure)   Atrial fibrillation, persistent   Chronic anticoagulation, on Eliquis   Hypertension   Type I (juvenile type) diabetes mellitus with peripheral circulatory disorders, uncontrolled(250.73)   Acute on chronic diastolic heart failure  Plan: Admitted yesterday for a/c diastolic HF. Was seen in office by Dr. Debara Pickett, prior to admission. She has diuresed 3.1 L since admit.  Her weight today is 261. Her dry weight is ~235-240 lb. Will use dry weight as target for diuresis. Renal function and BP both stable. Resume IV Lasix infusion as well as potassium supplementation. Electrolytes are WNL. Continue daily weights, strict I/O and low sodium diet. Afib is rate controlled. Continue on Cardizem and Eliquis.     LOS: 1 day    Brittainy M. Ladoris Gene 01/25/2014 8:22 AM  Attending Note:   The patient was seen and examined.  Agree with assessment and plan as noted above.  Changes made to the above note as needed.  1. Acute on chronic diastolic dysfunction:  Multifactorial - Afib, morbid obesity,  HTN, OSA, DM.  Continue lasix drip for now.  She will liikely need demadex as op.    2. Atrial fib:  Rate is ok.   Cont. Eliquis.    Thayer Headings, Brooke Bonito., MD, [facility] 01/25/2014, 11:30 AM

## 2014-01-26 DIAGNOSIS — I509 Heart failure, unspecified: Secondary | ICD-10-CM

## 2014-01-26 DIAGNOSIS — E662 Morbid (severe) obesity with alveolar hypoventilation: Secondary | ICD-10-CM

## 2014-01-26 LAB — BASIC METABOLIC PANEL
BUN: 47 mg/dL — ABNORMAL HIGH (ref 6–23)
CALCIUM: 9.3 mg/dL (ref 8.4–10.5)
CO2: 28 mEq/L (ref 19–32)
Chloride: 92 mEq/L — ABNORMAL LOW (ref 96–112)
Creatinine, Ser: 0.95 mg/dL (ref 0.50–1.10)
GFR calc Af Amer: 67 mL/min — ABNORMAL LOW (ref >90)
GFR calc non Af Amer: 58 mL/min — ABNORMAL LOW (ref >90)
GLUCOSE: 136 mg/dL — AB (ref 70–99)
Potassium: 3.6 mEq/L — ABNORMAL LOW (ref 3.7–5.3)
Sodium: 137 mEq/L (ref 137–147)

## 2014-01-26 LAB — GLUCOSE, CAPILLARY
Glucose-Capillary: 134 mg/dL — ABNORMAL HIGH (ref 70–99)
Glucose-Capillary: 202 mg/dL — ABNORMAL HIGH (ref 70–99)
Glucose-Capillary: 228 mg/dL — ABNORMAL HIGH (ref 70–99)
Glucose-Capillary: 120 mg/dL — ABNORMAL HIGH (ref 70–99)

## 2014-01-26 MED ORDER — POTASSIUM CHLORIDE CRYS ER 20 MEQ PO TBCR
30.0000 meq | EXTENDED_RELEASE_TABLET | Freq: Two times a day (BID) | ORAL | Status: DC
  Administered 2014-01-26: 30 meq via ORAL
  Filled 2014-01-26 (×3): qty 1

## 2014-01-26 NOTE — Progress Notes (Addendum)
Primary cardiologist:  [place]  Subjective: [name] is a 73 year old female who is morbidly obese and recently had bilateral knee replacement. She also has a history of dyslipidemia, hypertension, sleep apnea, diabetes type 2, sarcoidosis, paroxysmal A-fib on Eliquis,  breast cancer ( in remission)   She was admitted with weight gain and volume overload She has diuresed some since admission.  Weight is down from 259 to 252.     Echo: 11/01/13: Left ventricle: The cavity size was normal. Wall thickness was normal. Systolic function was normal. The estimated ejection fraction was in the range of 55% to 60%. Wall motion was normal; there were no regional wall motion abnormalities. Doppler parameters are consistent with elevated mean left atrial filling pressure. - Mitral valve: Calcified annulus. Mild regurgitation. Valve area by continuity equation (using LVOT flow): 2.53cm^2. - Left atrium: The atrium was moderately dilated. - Right ventricle: The cavity size was moderately dilated. - Right atrium: The atrium was mildly dilated. - Pulmonary arteries: Systolic pressure was mildly increased. PA peak pressure: 21mm Hg (S). - Pericardium, extracardiac: A trivial pericardial effusion    Objective: Vital signs in last 24 hours: Temp:  [97.7 F (36.5 C)-98 F (36.7 C)] 98 F (36.7 C) (02/15 0444) Pulse Rate:  [57-92] 57 (02/15 0444) Resp:  [16-20] 18 (02/15 0444) BP: (102-118)/(49-60) 114/54 mmHg (02/15 0444) SpO2:  [94 %-99 %] 94 % (02/15 0841) FiO2 (%):  [21 %] 21 % (02/15 0841) Weight:  [252 lb 1.6 oz (114.352 kg)] 252 lb 1.6 oz (114.352 kg) (02/15 0444) Last BM Date: 01/25/14  Intake/Output from previous day: 02/14 0701 - 02/15 0700 In: 1053.7 [P.O.:840; I.V.:213.7] Out: 2875 [Urine:2875] Intake/Output this shift: Total I/O In: 240 [P.O.:240] Out: 400 [Urine:400]  Medications Current Facility-Administered Medications  Medication Dose Route Frequency Provider  Last Rate Last Dose  . 0.9 %  sodium chloride infusion  250 mL Intravenous PRN Tarri Fuller, PA-C      . acetaminophen (TYLENOL) tablet 650 mg  650 mg Oral Q6H PRN Pixie Casino, MD   650 mg at 01/25/14 2324  . albuterol (PROVENTIL) (2.5 MG/3ML) 0.083% nebulizer solution 2.5 mg  2.5 mg Nebulization Q6H PRN Pixie Casino, MD      . allopurinol (ZYLOPRIM) tablet 200 mg  200 mg Oral Daily Tarri Fuller, PA-C   200 mg at 01/25/14 1052  . apixaban (ELIQUIS) tablet 5 mg  5 mg Oral BID Tarri Fuller, PA-C   5 mg at 01/25/14 2300  . colchicine tablet 0.6 mg  0.6 mg Oral BID Tarri Fuller, PA-C   0.6 mg at 01/25/14 2300  . diltiazem (CARDIZEM CD) 24 hr capsule 240 mg  240 mg Oral Daily Tarri Fuller, PA-C   240 mg at 01/25/14 1052  . exemestane (AROMASIN) tablet 25 mg  25 mg Oral QPC breakfast Tarri Fuller, PA-C   25 mg at 01/25/14 1059  . ferrous fumarate (HEMOCYTE - 106 mg FE) tablet 106 mg of iron  1 tablet Oral BID Tarri Fuller, PA-C   106 mg of iron at 01/26/14 0004  . furosemide (LASIX) 250 mg in dextrose 5 % 250 mL infusion  8 mg/hr Intravenous Continuous Tarri Fuller, PA-C 8 mL/hr at 01/24/14 2117 8 mg/hr at 01/24/14 2117  . insulin aspart (novoLOG) injection 0-15 Units  0-15 Units Subcutaneous TID WC Tarri Fuller, PA-C   2 Units at 01/26/14 [phone]  . insulin glargine (LANTUS) injection 28 Units  28 Units Subcutaneous QHS Tarri Fuller, PA-C  28 Units at 01/25/14 2300  . ipratropium (ATROVENT) nebulizer solution 0.5 mg  0.5 mg Nebulization Q6H Pixie Casino, MD   0.5 mg at 01/26/14 4166  . LORazepam (ATIVAN) tablet 0.5 mg  0.5 mg Oral QHS Tarri Fuller, PA-C   0.5 mg at 01/25/14 2322  . losartan (COZAAR) tablet 50 mg  50 mg Oral Daily Tarri Fuller, PA-C   50 mg at 01/25/14 1054  . meclizine (ANTIVERT) tablet 6.25-12.5 mg  6.25-12.5 mg Oral TID PRN Tarri Fuller, PA-C      . mometasone-formoterol (DULERA) 100-5 MCG/ACT inhaler 2 puff  2 puff Inhalation BID Tarri Fuller, PA-C   2 puff at 01/26/14 [phone]  .  ondansetron (ZOFRAN) tablet 4 mg  4 mg Oral Q8H PRN Tarri Fuller, PA-C      . polyethylene glycol (MIRALAX / GLYCOLAX) packet 17 g  17 g Oral Daily Tarri Fuller, PA-C   17 g at 01/25/14 1053  . potassium chloride SA (K-DUR,KLOR-CON) CR tablet 20 mEq  20 mEq Oral BID Tarri Fuller, PA-C   20 mEq at 01/25/14 2200  . simvastatin (ZOCOR) tablet 40 mg  40 mg Oral QHS Tarri Fuller, PA-C   40 mg at 01/25/14 2300  . sodium chloride 0.9 % injection 3 mL  3 mL Intravenous Q12H Tarri Fuller, PA-C   3 mL at 01/25/14 1054  . sodium chloride 0.9 % injection 3 mL  3 mL Intravenous PRN Tarri Fuller, PA-C      . vitamin C (ASCORBIC ACID) tablet 1,000 mg  1,000 mg Oral Daily Tarri Fuller, PA-C   1,000 mg at 01/25/14 1054    PE: General appearance: alert, cooperative, no distress and moderately obese Lungs: mostly clear Heart: irregularly irregular rhythm Extremities: no edema.  Left  lower leg is larger than right leg  Pulses: 2+  Skin: warm and dry Neurologic: Grossly normal  Lab Results:   Recent Labs  01/24/14 1945  WBC 8.4  HGB 13.5  HCT 41.0  PLT 183   BMET  Recent Labs  01/24/14 1945 01/25/14 0457 01/26/14 0420  NA 141 139 137  K 4.3 3.9 3.6*  CL 97 96 92*  CO2 30 29 28   GLUCOSE 231* 137* 136*  BUN 28* 34* 47*  CREATININE 0.91 0.83 0.95  CALCIUM 9.6 9.5 9.3   BNP (last 3 results)  Recent Labs  10/31/13 2210 11/07/13 0425 01/24/14 1945  PROBNP 2189.0* 802.3* 665.4*   Filed Weights   01/24/14 1716 01/25/14 0320 01/26/14 0444  Weight: 259 lb 4.8 oz (117.618 kg) 261 lb 0.4 oz (118.4 kg) 252 lb 1.6 oz (114.352 kg)     Assessment/Plan  Active Problems:   PULMONARY SARCOIDOSIS   Obstructive sleep apnea   Hyperlipidemia   Acute on chronic diastolic CHF (congestive heart failure)   Atrial fibrillation, persistent   Chronic anticoagulation, on Eliquis   Hypertension   Type I (juvenile type) diabetes mellitus with peripheral circulatory disorders, uncontrolled(250.73)   Acute  on chronic diastolic heart failure    1. Acute on chronic diastolic dysfunction:  Multifactorial - Afib, morbid obesity,  HTN, OSA, DM.  Continue lasix drip for now.   Will need more potassium supplementation.  She will liikely need demadex as op.    2. Atrial fib:  Rate is ok.  Cont. Eliquis.   3. HTN:  Continue meds.   4. Obstructive sleep apnea:  Ramond Dial., MD, [facility] 01/26/2014, 10:00 AM

## 2014-01-27 DIAGNOSIS — G4733 Obstructive sleep apnea (adult) (pediatric): Secondary | ICD-10-CM

## 2014-01-27 DIAGNOSIS — D869 Sarcoidosis, unspecified: Secondary | ICD-10-CM

## 2014-01-27 DIAGNOSIS — E876 Hypokalemia: Secondary | ICD-10-CM | POA: Insufficient documentation

## 2014-01-27 DIAGNOSIS — J9600 Acute respiratory failure, unspecified whether with hypoxia or hypercapnia: Secondary | ICD-10-CM

## 2014-01-27 LAB — BASIC METABOLIC PANEL
BUN: 49 mg/dL — AB (ref 6–23)
CO2: 29 mEq/L (ref 19–32)
Calcium: 9.7 mg/dL (ref 8.4–10.5)
Chloride: 94 mEq/L — ABNORMAL LOW (ref 96–112)
Creatinine, Ser: 0.89 mg/dL (ref 0.50–1.10)
GFR calc Af Amer: 72 mL/min — ABNORMAL LOW (ref >90)
GFR, EST NON AFRICAN AMERICAN: 62 mL/min — AB (ref >90)
Glucose, Bld: 128 mg/dL — ABNORMAL HIGH (ref 70–99)
POTASSIUM: 3.3 meq/L — AB (ref 3.7–5.3)
Sodium: 137 mEq/L (ref 137–147)

## 2014-01-27 LAB — GLUCOSE, CAPILLARY
GLUCOSE-CAPILLARY: 131 mg/dL — AB (ref 70–99)
GLUCOSE-CAPILLARY: 217 mg/dL — AB (ref 70–99)
Glucose-Capillary: 131 mg/dL — ABNORMAL HIGH (ref 70–99)
Glucose-Capillary: 171 mg/dL — ABNORMAL HIGH (ref 70–99)

## 2014-01-27 MED ORDER — FUROSEMIDE 80 MG PO TABS
80.0000 mg | ORAL_TABLET | Freq: Two times a day (BID) | ORAL | Status: DC
  Administered 2014-01-27 – 2014-01-30 (×7): 80 mg via ORAL
  Filled 2014-01-27 (×9): qty 1

## 2014-01-27 MED ORDER — ATORVASTATIN CALCIUM 20 MG PO TABS
20.0000 mg | ORAL_TABLET | Freq: Every day | ORAL | Status: DC
  Administered 2014-01-27 – 2014-01-29 (×3): 20 mg via ORAL
  Filled 2014-01-27 (×4): qty 1

## 2014-01-27 MED ORDER — POTASSIUM CHLORIDE CRYS ER 20 MEQ PO TBCR
40.0000 meq | EXTENDED_RELEASE_TABLET | Freq: Three times a day (TID) | ORAL | Status: DC
  Administered 2014-01-27 – 2014-01-28 (×6): 40 meq via ORAL
  Filled 2014-01-27 (×10): qty 2

## 2014-01-27 NOTE — Progress Notes (Signed)
Patient Name: [name] Date of Encounter: 01/27/2014     Principal Problem:   Acute on chronic diastolic CHF (congestive heart failure) Active Problems:   Obstructive sleep apnea   Obesity hypoventilation syndrome   Atrial fibrillation, persistent   Hypertension   Type I (juvenile type) diabetes mellitus with peripheral circulatory disorders, uncontrolled(250.73)   PULMONARY SARCOIDOSIS   Hyperlipidemia   Chronic anticoagulation, on Eliquis   Hypokalemia   SUBJECTIVE  Breathing improved.  Wt down to 254.  Edema much improved.  CURRENT MEDS . allopurinol  200 mg Oral Daily  . apixaban  5 mg Oral BID  . colchicine  0.6 mg Oral BID  . diltiazem  240 mg Oral Daily  . exemestane  25 mg Oral QPC breakfast  . ferrous fumarate  1 tablet Oral BID  . insulin aspart  0-15 Units Subcutaneous TID WC  . insulin glargine  28 Units Subcutaneous QHS  . LORazepam  0.5 mg Oral QHS  . losartan  50 mg Oral Daily  . mometasone-formoterol  2 puff Inhalation BID  . polyethylene glycol  17 g Oral Daily  . potassium chloride  40 mEq Oral TID  . simvastatin  40 mg Oral QHS  . sodium chloride  3 mL Intravenous Q12H  . vitamin C  1,000 mg Oral Daily   OBJECTIVE  Filed Vitals:   01/26/14 1439 01/26/14 2020 01/27/14 0827 01/27/14 0903  BP: 98/62 112/66    Pulse: 67 79    Temp: 97 F (36.1 C) 97.9 F (36.6 C)    TempSrc: Oral Oral    Resp: 20 20    Height:      Weight:   254 lb 1.6 oz (115.259 kg)   SpO2: 93% 96%  98%    Intake/Output Summary (Last 24 hours) at 01/27/14 1015 Last data filed at 01/27/14 0955  Gross per 24 hour  Intake   2468 ml  Output   3275 ml  Net   -807 ml   Filed Weights   01/25/14 0320 01/26/14 0444 01/27/14 0827  Weight: 261 lb 0.4 oz (118.4 kg) 252 lb 1.6 oz (114.352 kg) 254 lb 1.6 oz (115.259 kg)    PHYSICAL EXAM  General: Pleasant, NAD. Neuro: Alert and oriented X 3. Moves all extremities spontaneously. Psych: Normal affect. HEENT:   Normal  Neck: Supple without bruits or JVD. Lungs:  Resp regular and unlabored, diminished breath sounds @ bilat bases. Heart: IR, IR no s3, s4, or murmurs. Abdomen: Soft, non-tender, non-distended, BS + x 4.  Extremities: No clubbing, cyanosis or edema. DP/PT/Radials 1+ and equal bilaterally.  Accessory Clinical Findings  CBC  Recent Labs  01/24/14 1945  WBC 8.4  NEUTROABS 5.9  HGB 13.5  HCT 41.0  MCV 85.2  PLT 366   Basic Metabolic Panel  Recent Labs  01/26/14 0420 01/27/14 0608  NA 137 137  K 3.6* 3.3*  CL 92* 94*  CO2 28 29  GLUCOSE 136* 128*  BUN 47* 49*  CREATININE 0.95 0.89  CALCIUM 9.3 9.7   TELE  Afib, mostly 70's to 90's.  Radiology/Studies  No results found.  ASSESSMENT AND PLAN  1.  Acute on chronic diastolic chf:  Volume much improved.  HR/BP stable.  Will switch from IV lasix gtt to PO lasix @ 80mg  BID. Renal fxn stable.  It looks like previous low weight in 11/2013 was around 237, though she has quite a wide range, as high as 277.  Mean somewhere  around 250.    2.  Afib:  Rate controlled.  Cont dilt/eliquis.  3.  HTN:  Stable.    4.  Hypokalemia:  Supp.  5.  Obesity:  Nutrition counseling.  6.  DM:  Cont current regimen.  Signed, Murray Hodgkins NP  I have seen and examined the patient along with Murray Hodgkins NP.  I have reviewed the chart, notes and new data.  I agree with PA/NP's note.  Key new complaints: feels better Key examination changes: edema has resolved, prominent varicose veins Key new findings / data: excellent renal function  PLAN: Switch to PO diuretic. If she maintains good UO, negative I/O may be ready for DC in AM.  Sanda Klein, MD, [facility] [phone] 01/27/2014, 10:57 AM

## 2014-01-27 NOTE — Progress Notes (Signed)
Nutrition Education Note  RD consulted for nutrition education regarding new onset CHF.  RD provided "Low Sodium Nutrition Therapy" handout from the Academy of [employer] and Dietetics. Reviewed patient's dietary recall. Provided examples on ways to decrease sodium intake in diet. Discouraged intake of processed foods and use of salt shaker. Encouraged fresh fruits and vegetables as well as whole grain sources of carbohydrates to maximize fiber intake. Additionally provided "Sodium Content of Foods List" from the Academy of Nutrition and Dietetics. Encouraged pt to avoid high sodium foods and to choose low sodium, very low sodium, and sodium free foods.  RD discussed why it is important for patient to adhere to diet recommendations, and emphasized the role of fluids, foods to avoid, and importance of weighing self daily. Teach back method used.  Expect good compliance.  Body mass index is 51.29 kg/(m^2). Pt meets criteria for Morbid Obesity based on current BMI.  Current diet order is Heart Healthy/Carb Mod, patient is consuming approximately 100% of meals at this time. Labs and medications reviewed. No further nutrition interventions warranted at this time. RD contact information provided. If additional nutrition issues arise, please re-consult RD.   Pryor Ochoa RD, LDN Inpatient Clinical Dietitian Pager: [phone] After Hours Pager: [phone]

## 2014-01-27 NOTE — Progress Notes (Addendum)
I visited with patient to discuss her Heart Failure diagnosis.  She is not new to heart failure and has lived with it for "a while".  She lives alone and uses senior services for transportation.  Admits to not "always eating right" and would like some further education.  Weighs "almost everyday", and takes medications as prescribed.  She is aware of signs and symptoms of heart failure and when to call the physician--She says that she let it "go to far this time".  Reinforced that she needs to weigh daily and call physician when she has sign and symptoms of heart failure including weight gain.  I will check into [facility] at discharge as her plan is to go home alone.  She would benefit from telehealth.  Carole Binning RN, BSN, PCCN--Heart Failure Navigator

## 2014-01-28 DIAGNOSIS — I5032 Chronic diastolic (congestive) heart failure: Secondary | ICD-10-CM

## 2014-01-28 DIAGNOSIS — E876 Hypokalemia: Secondary | ICD-10-CM

## 2014-01-28 DIAGNOSIS — I10 Essential (primary) hypertension: Secondary | ICD-10-CM

## 2014-01-28 DIAGNOSIS — Z7901 Long term (current) use of anticoagulants: Secondary | ICD-10-CM

## 2014-01-28 DIAGNOSIS — R0902 Hypoxemia: Secondary | ICD-10-CM

## 2014-01-28 DIAGNOSIS — J9610 Chronic respiratory failure, unspecified whether with hypoxia or hypercapnia: Secondary | ICD-10-CM

## 2014-01-28 LAB — GLUCOSE, CAPILLARY
GLUCOSE-CAPILLARY: 152 mg/dL — AB (ref 70–99)
Glucose-Capillary: 83 mg/dL (ref 70–99)
Glucose-Capillary: 191 mg/dL — ABNORMAL HIGH (ref 70–99)

## 2014-01-28 LAB — BASIC METABOLIC PANEL
BUN: 49 mg/dL — AB (ref 6–23)
CALCIUM: 9.7 mg/dL (ref 8.4–10.5)
CO2: 30 mEq/L (ref 19–32)
Chloride: 98 mEq/L (ref 96–112)
Creatinine, Ser: 0.89 mg/dL (ref 0.50–1.10)
GFR, EST AFRICAN AMERICAN: 72 mL/min — AB (ref >90)
GFR, EST NON AFRICAN AMERICAN: 62 mL/min — AB (ref >90)
GLUCOSE: 100 mg/dL — AB (ref 70–99)
Potassium: 4.1 mEq/L (ref 3.7–5.3)
Sodium: 140 mEq/L (ref 137–147)

## 2014-01-28 MED ORDER — SPIRONOLACTONE 12.5 MG HALF TABLET
12.5000 mg | ORAL_TABLET | Freq: Every day | ORAL | Status: DC
  Administered 2014-01-28 – 2014-01-30 (×3): 12.5 mg via ORAL
  Filled 2014-01-28 (×3): qty 1

## 2014-01-28 NOTE — Progress Notes (Signed)
Patient ID: [name], female   DOB: 07-26-1941, 73 y.o.   MRN: [id number] Date of Encounter: 01/28/2014   Principal Problem:   Acute on chronic diastolic CHF (congestive heart failure) Active Problems:   PULMONARY SARCOIDOSIS   Obstructive sleep apnea   Obesity hypoventilation syndrome   Hyperlipidemia   Atrial fibrillation, persistent   Chronic anticoagulation, on Eliquis   Hypertension   Type I (juvenile type) diabetes mellitus with peripheral circulatory disorders, uncontrolled(250.73)   Hypokalemia   SUBJECTIVE Breathing improved.  Wt down to 252.  Edema much improved.  Still feels a bit weak.  Has yet to try walking more than around the room.  CURRENT MEDS . allopurinol  200 mg Oral Daily  . apixaban  5 mg Oral BID  . atorvastatin  20 mg Oral q1800  . colchicine  0.6 mg Oral BID  . diltiazem  240 mg Oral Daily  . exemestane  25 mg Oral QPC breakfast  . ferrous fumarate  1 tablet Oral BID  . furosemide  80 mg Oral BID  . insulin aspart  0-15 Units Subcutaneous TID WC  . insulin glargine  28 Units Subcutaneous QHS  . LORazepam  0.5 mg Oral QHS  . losartan  50 mg Oral Daily  . mometasone-formoterol  2 puff Inhalation BID  . polyethylene glycol  17 g Oral Daily  . potassium chloride  40 mEq Oral TID  . sodium chloride  3 mL Intravenous Q12H  . vitamin C  1,000 mg Oral Daily   OBJECTIVE  Filed Vitals:   01/28/14 0121 01/28/14 0505 01/28/14 0839 01/28/14 0900  BP:  100/57  113/67  Pulse: 83 75  75  Temp:  97.2 F (36.2 C)    TempSrc:  Oral    Resp: 18 20    Height:      Weight:  252 lb 10.4 oz (114.6 kg)    SpO2: 95% 95% 95%     Intake/Output Summary (Last 24 hours) at 01/28/14 1043 Last data filed at 01/28/14 0902  Gross per 24 hour  Intake   1592 ml  Output   1750 ml  Net   -158 ml   Filed Weights   01/26/14 0444 01/27/14 0827 01/28/14 0505  Weight: 252 lb 1.6 oz (114.352 kg) 254 lb 1.6 oz (115.259 kg) 252 lb 10.4 oz (114.6 kg)   PHYSICAL  EXAM  General: Pleasant, NAD. Neuro: A&O X 3. Moves all extremities spontaneously.  NAD Psych: Normal affect. HEENT:  [place]/AT  Neck: Supple without bruits or JVD. -- + HJR Lungs:  Resp regular and unlabored, diminished breath sounds @ bilat bases. Heart: Irreg/Irreg; No M/R/G. Abdomen: Soft, non-tender, non-distended, BS + x 4.  Extremities: No clubbing, cyanosis or edema. DP/PT/Radials 1+ and equal bilaterally.  Diffuse venous varicosities RLE>>L.  Accessory Clinical Findings CBC No results found for this basename: WBC, NEUTROABS, HGB, HCT, MCV, PLT,  in the last 72 hours Basic Metabolic Panel  Recent Labs  01/27/14 0608 01/28/14 0435  NA 137 140  K 3.3* 4.1  CL 94* 98  CO2 29 30  GLUCOSE 128* 100*  BUN 49* 49*  CREATININE 0.89 0.89  CALCIUM 9.7 9.7   TELE Afib, mostly 70's to 90's.  PVCs vs. [place]berancy.  Radiology/Studies  No results found.  ASSESSMENT AND PLAN 1.  Acute on chronic diastolic chf:  Volume much improved.  HR/BP stable.  Converted from IV lasix gtt to PO lasix @ 80mg  BID yesterday - ~net  even x 24 hrs.  Renal fxn stable.  It looks like previous low weight in 11/2013 was around 237, though she has quite a wide range, as high as 277.  Mean somewhere around 250.  She still feels a bit volume up & with HJR may still need additional diuresis.  Will add low dose spironolactone.  Need to see how well she does with ambulation   Agree that she would benefit from telehealth & possibly [place] as recommended by HF Navigator RN>  2.  Afib:  Rate controlled.  Cont dilt/eliquis. No Sx.  3.  HTN:  Stable BP.  4.  Hypokalemia:  Supp. Adding Aldosterone inhibition.  5.  Obesity:  Nutrition counseling.  6.  DM:  Cont current regimen.  Anticipate d/c as early as tomorrow, depended upon I/O being at least even if not slightly negative. Need HHRN set up. Will need close f/u to ensure understanding of OP regimen.  Signed, Leonie Man, M.D., M.S. Interventional  Cardiologist  [place] Pager # [phone] 01/28/2014

## 2014-01-28 NOTE — Evaluation (Signed)
Physical Therapy Evaluation Patient Details Name: [name] MRN: [id number] DOB: Jan 29, 1941 Today's Date: 01/28/2014 Time: 0093-8182 PT Time Calculation (min): 27 min  PT Assessment / Plan / Recommendation History of Present Illness  Pt admit with CHF.  Clinical Impression  Pt admitted with above. Pt currently with functional limitations due to the deficits listed below (see PT Problem List). Pt will benefit from skilled PT to increase their independence and safety with mobility to allow discharge to the venue listed below.     PT Assessment  Patient needs continued PT services    Follow Up Recommendations  Home health PT;Supervision/Assistance - 24 hour    Does the patient have the potential to tolerate intense rehabilitation      Barriers to Discharge Decreased caregiver support      Equipment Recommendations  None recommended by PT    Recommendations for Other Services     Frequency Min 3X/week    Precautions / Restrictions Precautions Precautions: Fall Restrictions Weight Bearing Restrictions: No   Pertinent Vitals/Pain VSS, no pain      Mobility  Bed Mobility Overal bed mobility: Independent Transfers Overall transfer level: Independent Equipment used: Rolling walker (2 wheeled) General transfer comment: Pt standing in front of chair on arrival finishing her bath.  Pt with good balance to stand and wash herself.   Ambulation/Gait Ambulation/Gait assistance: Supervision Ambulation Distance (Feet): 100 Feet Assistive device: Rolling walker (2 wheeled) Gait Pattern/deviations: Step-through pattern;Decreased stride length Gait velocity interpretation: <1.8 ft/sec, indicative of risk for recurrent falls General Gait Details: Pt needed occasional cues to stay close to RW.  Good safety with RW.      Exercises General Exercises - Lower Extremity Ankle Circles/Pumps: AROM;Both;10 reps;Seated Long Arc Quad: AROM;Both;10 reps;Seated   PT Diagnosis:  Generalized weakness  PT Problem List: Decreased strength;Decreased balance;Decreased mobility;Decreased knowledge of use of DME;Decreased safety awareness;Decreased knowledge of precautions PT Treatment Interventions: DME instruction;Gait training;Functional mobility training;Therapeutic activities;Therapeutic exercise;Balance training;Patient/family education     PT Goals(Current goals can be found in the care plan section) Acute Rehab PT Goals Patient Stated Goal: to go home PT Goal Formulation: With patient Time For Goal Achievement: 02/04/14 Potential to Achieve Goals: Good  Visit Information  Last PT Received On: 01/28/14 Assistance Needed: +1 History of Present Illness: Pt admit with CHF.       Prior [place] expects to be discharged to:: Private residence Living Arrangements: Alone Available Help at Discharge: Family;Personal care attendant;Available PRN/intermittently Type of Home: Apartment (handicapped) Home Access: Level entry Home Layout: One level Home Equipment: Flat Lick - 4 wheels;Tub bench;Grab bars - toilet;Grab bars - tub/shower;Bedside commode Prior Function Level of Independence: Needs assistance ADL's / Homemaking Assistance Needed: son does grocery shopping; 1x/month housekeeper Comments: chronic 2L O2; uses 4 wheel RW at all times Communication Communication: No difficulties Dominant Hand: Right    Cognition  Cognition Arousal/Alertness: Awake/alert Behavior During Therapy: WFL for tasks assessed/performed Overall Cognitive Status: Within Functional Limits for tasks assessed    Extremity/Trunk Assessment Upper Extremity Assessment Upper Extremity Assessment: Defer to OT evaluation Lower Extremity Assessment Lower Extremity Assessment: Generalized weakness   Balance Balance Overall balance assessment: Needs assistance;History of Falls Standing balance support: Bilateral upper extremity supported;During functional  activity Standing balance-Leahy Scale: Fair Standing balance comment: Pt holding onto RW and needs RW to balance.    End of Session PT - End of Session Equipment Utilized During Treatment: Gait belt Activity Tolerance: Patient limited by fatigue Patient left: in  chair;with call bell/phone within reach Nurse Communication: Mobility status  GP     [name] 01/28/2014, 1:21 PM [facility] Acute Rehabilitation [phone] (pager)

## 2014-01-28 NOTE — Progress Notes (Signed)
UR completed Ziasia Lenoir K. Lorianne Malbrough, RN, BSN, MSHL, CCM  01/28/2014 10:39 PM

## 2014-01-28 NOTE — Care Management Note (Addendum)
  Page 2 of 2   01/30/2014     3:57:59 PM   CARE MANAGEMENT NOTE 01/30/2014  Patient:  [name], [name]   Account Number:  [id number]  Date Initiated:  01/28/2014  Documentation initiated by:  Julian Medina  Subjective/Objective Assessment:   admitted with CHF/Resp Failure/Flu     Action/Plan:   follow dispositon needs   Anticipated DC Date:  01/29/2014   Anticipated DC Plan:  [place]  CM consult      [facility]  HOME HEALTH   Choice offered to / List presented to:  C-1 Patient           [place].   Status of service:  Completed, signed off Medicare Important Message given?   (If response is "NO", the following Medicare IM given date fields will be blank) Date Medicare IM given:   Date Additional Medicare IM given:    Discharge Disposition:  [place]  Per UR Regulation:  Reviewed for med. necessity/level of care/duration of stay  If discussed at [place] of Stay Meetings, dates discussed:    Comments:  01/30/2014 Disposition Plan Change: HHS provider changed to [facility] d/t order to d/c home with telehealth mgmt for CHF. [place] updating program and n/a for one week. Patient agrees to services with [place] notified. Patient active with Eliquis prior to this admission. CHF Clinic [facility] consulted ADD: today Makinzey Banes RN, BSN, MSHL, CCM 01/30/2014  01/28/2014 Social:  from home alone.  Confirms has emergency contact. Transportation:  Chiropodist - provided through [employer] - no charge to patient. Medications:  Self managed - medication box Home DME:  walker, Home 02 used prn only, [place] HHS Provider:  [place] / hx/o used in past PT evaluation:  HH PT HHS:  RN (CHF), PT ([facility] notified) Keian Odriscoll RN, BSN, [place], CCM 01/28/2014

## 2014-01-29 DIAGNOSIS — I272 Pulmonary hypertension, unspecified: Secondary | ICD-10-CM | POA: Diagnosis present

## 2014-01-29 LAB — GLUCOSE, CAPILLARY
GLUCOSE-CAPILLARY: 122 mg/dL — AB (ref 70–99)
GLUCOSE-CAPILLARY: 112 mg/dL — AB (ref 70–99)
Glucose-Capillary: 95 mg/dL (ref 70–99)
Glucose-Capillary: 143 mg/dL — ABNORMAL HIGH (ref 70–99)
Glucose-Capillary: 188 mg/dL — ABNORMAL HIGH (ref 70–99)

## 2014-01-29 MED ORDER — POTASSIUM CHLORIDE CRYS ER 20 MEQ PO TBCR
20.0000 meq | EXTENDED_RELEASE_TABLET | Freq: Two times a day (BID) | ORAL | Status: DC
  Administered 2014-01-29 (×2): 20 meq via ORAL
  Filled 2014-01-29 (×3): qty 1

## 2014-01-29 NOTE — Progress Notes (Signed)
I spoke again briefly with Ms. Hertenstein to reinforce HF education.  We again discussed signs and symptoms of HF, when to call the physician, importance of daily weights and low sodium diet.  She verbalizes understanding and says she "thinks that she is ready" for discharge.  She will need quick follow- up with [facility] after hospital discharge and is already set with [facility] and [place].  I will check back tomorrow to verify follow-up appointment is made and that she is aware.  Carole Binning RN, BSN, PCCN--Heart Failure Art therapist

## 2014-01-29 NOTE — Progress Notes (Signed)
Patient is not ready to use the CPAP machine at this time. Patient is still sitting in chair watching tv.. RT will check back again a little later.

## 2014-01-29 NOTE — Progress Notes (Signed)
Ambulate patient in hallway, patient walk few steps with front wheel rolling walker. No c/o shortness of breath, or pain. Patient said she is ready to go home, also have 2 sons live close by to check on her.  Will continue to monitor patient.

## 2014-01-29 NOTE — Progress Notes (Signed)
Subjective:  Up in chair. Denies SOB.  Objective:  Vital Signs in the last 24 hours: Temp:  [97.4 F (36.3 C)-97.7 F (36.5 C)] 97.7 F (36.5 C) (02/18 0522) Pulse Rate:  [58-78] 68 (02/18 0522) Resp:  [16-18] 18 (02/18 0522) BP: (99-108)/(52-67) 108/54 mmHg (02/18 0522) SpO2:  [95 %-98 %] 98 % (02/18 0858) Weight:  [252 lb 10.4 oz (114.6 kg)] 252 lb 10.4 oz (114.6 kg) (02/18 0522)  Intake/Output from previous day:  Intake/Output Summary (Last 24 hours) at 01/29/14 0920 Last data filed at 01/29/14 0816  Gross per 24 hour  Intake    960 ml  Output   1325 ml  Net   -365 ml    Physical Exam: General appearance: alert, cooperative, no distress and moderately obese Lungs: clear to auscultation bilaterally Heart: irregularly irregular rhythm   Rate: 68  Rhythm: atrial fibrillation  Lab Results: No results found for this basename: WBC, HGB, PLT,  in the last 72 hours  Recent Labs  01/27/14 0608 01/28/14 0435  NA 137 140  K 3.3* 4.1  CL 94* 98  CO2 29 30  GLUCOSE 128* 100*  BUN 49* 49*  CREATININE 0.89 0.89   No results found for this basename: TROPONINI, CK, MB,  in the last 72 hours No results found for this basename: INR,  in the last 72 hours  Imaging: Imaging results have been reviewed  Cardiac Studies:  Assessment/Plan:   Principal Problem:   Acute on chronic diastolic heart failure Active Problems:   PULMONARY SARCOIDOSIS   Obstructive sleep apnea   Obesity hypoventilation syndrome   COPD (chronic obstructive pulmonary disease)   Atrial fibrillation, persistent   Diabetes mellitus, type II   Pulmonary hypertension- PA pressure 46 mmHg   Breast cancer, stage 1 s/p rt lumpectomy and radiation 2012   Hyperlipidemia   Chronic anticoagulation, on Eliquis   Chronic diastolic CHF (congestive heart failure)   Hypertension    PLAN: Will discuss with MD. I have not seen her prior to today. She repeatedly deferred to [place] wether or not she was ready for  discharge, she would not say one way or the other. She lives alone and uses senior services for transportation. If discharged, she is at high risk for readmission.  Decrease K+ supplement since Aldactone has been added. She will need early (TCM) follow up with BMP.   Kerin Ransom PA-C [facility] [phone] 01/29/2014, 9:20 AM   I have seen and examined the patient along with Kerin Ransom PA-C.  I have reviewed the chart, notes and new data.  I agree with PA's note.  PLAN: She continues to slowly diurese on the current regimen. I don't think additional med changes will be made. Her living situation and lack of mobility are a concern. Will need home health services and early follow up. Will try to set up for DC tomorrow.  Sanda Klein, MD, [place] [phone] 01/29/2014, 1:23 PM

## 2014-01-30 ENCOUNTER — Telehealth: Payer: Self-pay | Admitting: Internal Medicine

## 2014-01-30 LAB — BASIC METABOLIC PANEL
BUN: 42 mg/dL — ABNORMAL HIGH (ref 6–23)
CALCIUM: 9.9 mg/dL (ref 8.4–10.5)
CO2: 31 meq/L (ref 19–32)
CREATININE: 0.91 mg/dL (ref 0.50–1.10)
Chloride: 96 mEq/L (ref 96–112)
GFR calc Af Amer: 70 mL/min — ABNORMAL LOW (ref >90)
GFR, EST NON AFRICAN AMERICAN: 61 mL/min — AB (ref >90)
Glucose, Bld: 102 mg/dL — ABNORMAL HIGH (ref 70–99)
Potassium: 4.3 mEq/L (ref 3.7–5.3)
Sodium: 141 mEq/L (ref 137–147)

## 2014-01-30 LAB — GLUCOSE, CAPILLARY
Glucose-Capillary: 98 mg/dL (ref 70–99)
Glucose-Capillary: 159 mg/dL — ABNORMAL HIGH (ref 70–99)

## 2014-01-30 LAB — PRO B NATRIURETIC PEPTIDE: PRO B NATRI PEPTIDE: 404.5 pg/mL — AB (ref 0–125)

## 2014-01-30 MED ORDER — SPIRONOLACTONE 12.5 MG HALF TABLET: 12.5000 mg | ORAL_TABLET | Freq: Every day | ORAL | Status: DC

## 2014-01-30 MED ORDER — POTASSIUM CHLORIDE CRYS ER 20 MEQ PO TBCR
20.0000 meq | EXTENDED_RELEASE_TABLET | Freq: Every day | ORAL | Status: DC
  Administered 2014-01-30: 20 meq via ORAL

## 2014-01-30 MED ORDER — POTASSIUM CHLORIDE 20 MEQ PO PACK: 20.0000 meq | PACK | Freq: Every day | ORAL | Status: DC

## 2014-01-30 MED ORDER — FUROSEMIDE 80 MG PO TABS: 80.0000 mg | ORAL_TABLET | Freq: Two times a day (BID) | ORAL | Status: DC

## 2014-01-30 NOTE — Progress Notes (Signed)
Physical Therapy Treatment Patient Details Name: [name] MRN: [id number] DOB: [birth year] Today's Date: 01/30/2014 Time: 5188-4166 PT Time Calculation (min): 12 min  PT Assessment / Plan / Recommendation  History of Present Illness Pt admit with CHF.   PT Comments   Pt has met all goals and is mod I with mobility and gait with RW.  Pt states she feels safe and ready for d/c home at this level. Pt educated on safety and energy conservation at home.  Pt agreeable to HHPT for continued strengthening.  Follow Up Recommendations  Home health PT     Does the patient have the potential to tolerate intense rehabilitation     Barriers to Discharge        Equipment Recommendations  None recommended by PT    Recommendations for Other Services    Frequency Min 3X/week   Progress towards PT Goals Progress towards PT goals: Goals met/education completed, patient discharged from PT  Plan Current plan remains appropriate    Precautions / Restrictions Restrictions Weight Bearing Restrictions: No   Pertinent Vitals/Pain No c/o pain    Mobility  Transfers Overall transfer level: Modified independent Equipment used: Rolling walker (2 wheeled) Ambulation/Gait Ambulation/Gait assistance: Modified independent (Device/Increase time) Ambulation Distance (Feet): 150 Feet Assistive device: Rolling walker (2 wheeled) Gait velocity interpretation: Below normal speed for age/gender General Gait Details: pt with antalgic gait, no LOB    Exercises     PT Diagnosis:    PT Problem List:   PT Treatment Interventions:     PT Goals (current goals can now be found in the care plan section)    Visit Information  Last PT Received On: 01/30/14 Assistance Needed: +1 History of Present Illness: Pt admit with CHF.    Subjective Data      Cognition  Cognition Arousal/Alertness: Awake/alert Behavior During Therapy: WFL for tasks assessed/performed Overall Cognitive Status: Within Functional  Limits for tasks assessed    Balance     End of Session PT - End of Session Activity Tolerance: Patient tolerated treatment well Patient left: in chair;with call bell/phone within reach Nurse Communication: Mobility status   GP     Dominique Ressel 01/30/2014, 10:01 AM

## 2014-01-30 NOTE — Telephone Encounter (Signed)
TCM Patient  Phone Call. Home: [phone].Marland Kitchen Cell [phone]  Call pt on Monday

## 2014-01-30 NOTE — Discharge Summary (Signed)
Patient ID: [name],  MRN: [id number], DOB/AGE: 75-Oct-1942 75 y.o.  Admit date: 01/24/2014 Discharge date: 01/30/2014  Primary Care Provider: Shirline Frees, MD  Primary Cardiologist: Dr Debara Pickett  Discharge Diagnoses Principal Problem:   Acute on chronic diastolic heart failure Active Problems:   PULMONARY SARCOIDOSIS   Obstructive sleep apnea   Obesity hypoventilation syndrome   COPD (chronic obstructive pulmonary disease)   Atrial fibrillation, persistent   Diabetes mellitus, type II   Pulmonary hypertension- PA pressure 46 mmHg   Breast cancer, stage 1 s/p rt lumpectomy and radiation 2012   Hyperlipidemia   Chronic anticoagulation, on Eliquis   Chronic diastolic CHF (congestive heart failure)   Hypertension    Hospital Course:  73 y/o female followed by Dr Debara Pickett with CAF on Eliquis, COPD, chronic diastolic CHF (EF 95-18% 84/16), breast cancer, and morbid obesity with hypoventilation. She lives alone, she has a son that lives nearby. Her mobility is limited, she uses a walker. She was admitted 01/24/14 with acute on chronic diastolic CHF. Her wgt was 259 lbs. She was started on IV diuretics. She diuresed 7 lbs with improvement in her symptoms. She was seen in consult by the CHF RN and [facility] and PT have been arranged. Dr Sallyanne Kuster feels she is stable for discharge 01/30/14. She will need early TCM follow up.   Discharge Vitals:  Blood pressure 94/48, pulse 46, temperature 97.3 F (36.3 C), temperature source Oral, resp. rate 17, height 4' 11" (1.499 m), weight 252 lb 3.3 oz (114.4 kg), SpO2 94.00%.    Labs: Results for orders placed during the hospital encounter of 01/24/14 (from the past 48 hour(s))  GLUCOSE, CAPILLARY     Status: Abnormal   Collection Time    01/28/14 11:09 AM      Result Value Ref Range   Glucose-Capillary 191 (*) 70 - 99 mg/dL   Comment 1 Notify RN    GLUCOSE, CAPILLARY     Status: Abnormal   Collection Time    01/28/14  4:13 PM      Result Value Ref  Range   Glucose-Capillary 152 (*) 70 - 99 mg/dL   Comment 1 Notify RN    GLUCOSE, CAPILLARY     Status: Abnormal   Collection Time    01/28/14  9:45 PM      Result Value Ref Range   Glucose-Capillary 122 (*) 70 - 99 mg/dL  GLUCOSE, CAPILLARY     Status: None   Collection Time    01/29/14  6:45 AM      Result Value Ref Range   Glucose-Capillary 95  70 - 99 mg/dL   Comment 1 Notify RN    GLUCOSE, CAPILLARY     Status: Abnormal   Collection Time    01/29/14 11:57 AM      Result Value Ref Range   Glucose-Capillary 143 (*) 70 - 99 mg/dL   Comment 1 Notify RN    GLUCOSE, CAPILLARY     Status: Abnormal   Collection Time    01/29/14  4:26 PM      Result Value Ref Range   Glucose-Capillary 112 (*) 70 - 99 mg/dL   Comment 1 Notify RN    GLUCOSE, CAPILLARY     Status: Abnormal   Collection Time    01/29/14  9:33 PM      Result Value Ref Range   Glucose-Capillary 188 (*) 70 - 99 mg/dL   Comment 1 Notify RN    BASIC METABOLIC PANEL  Status: Abnormal   Collection Time    01/30/14  5:23 AM      Result Value Ref Range   Sodium 141  137 - 147 mEq/L   Potassium 4.3  3.7 - 5.3 mEq/L   Chloride 96  96 - 112 mEq/L   CO2 31  19 - 32 mEq/L   Glucose, Bld 102 (*) 70 - 99 mg/dL   BUN 42 (*) 6 - 23 mg/dL   Creatinine, Ser 0.91  0.50 - 1.10 mg/dL   Calcium 9.9  8.4 - 10.5 mg/dL   GFR calc non Af Amer 61 (*) >90 mL/min   GFR calc Af Amer 70 (*) >90 mL/min   Comment: (NOTE)     The eGFR has been calculated using the CKD EPI equation.     This calculation has not been validated in all clinical situations.     eGFR's persistently <90 mL/min signify possible Chronic Kidney     Disease.  PRO B NATRIURETIC PEPTIDE     Status: Abnormal   Collection Time    01/30/14  5:23 AM      Result Value Ref Range   Pro B Natriuretic peptide (BNP) 404.5 (*) 0 - 125 pg/mL  GLUCOSE, CAPILLARY     Status: None   Collection Time    01/30/14  7:02 AM      Result Value Ref Range   Glucose-Capillary 98  70 -  99 mg/dL    Disposition:  Follow-up Information   Follow up with Pixie Casino, MD. (office will call)    Specialty:  Cardiology   Contact information:   [place]  [id number] [phone]       Discharge Medications:    Medication List    STOP taking these medications       simvastatin 40 MG tablet  Commonly known as:  ZOCOR      TAKE these medications       acetaminophen 500 MG tablet  Commonly known as:  TYLENOL  Take 1,000 mg by mouth every 6 (six) hours as needed. pain     allopurinol 100 MG tablet  Commonly known as:  ZYLOPRIM  Take 200 mg by mouth daily.     apixaban 5 MG Tabs tablet  Commonly known as:  ELIQUIS  Take 1 tablet (5 mg total) by mouth 2 (two) times daily.     CALCIUM PO  Take by mouth 2 (two) times daily.     colchicine 0.6 MG tablet  Take 1 tablet (0.6 mg total) by mouth 2 (two) times daily.     diltiazem 240 MG 24 hr capsule  Commonly known as:  CARDIZEM CD  Take 1 capsule by mouth daily.     exemestane 25 MG tablet  Commonly known as:  AROMASIN  Take 1 tablet (25 mg total) by mouth daily after breakfast.     ferrous fumarate 325 (106 FE) MG Tabs tablet  Commonly known as:  HEMOCYTE - 106 mg FE  Take 1 tablet by mouth 2 (two) times daily.     Fluticasone-Salmeterol 250-50 MCG/DOSE Aepb  Commonly known as:  ADVAIR  Inhale 1 puff into the lungs 2 (two) times daily.     furosemide 80 MG tablet  Commonly known as:  LASIX  Take 1 tablet (80 mg total) by mouth 2 (two) times daily.     guaiFENesin 600 MG 12 hr tablet  Commonly known as:  MUCINEX  Take 1,200 mg by  mouth 2 (two) times daily.     HUMULIN N 100 UNIT/ML injection  Generic drug:  insulin NPH Human  Inject 5 Units into the skin as needed (5 units if blood sugar is greater than 150).     insulin glargine 100 UNIT/ML injection  Commonly known as:  LANTUS  Inject 0.28 mLs (28 Units total) into the skin at bedtime.     ipratropium 0.02 %  nebulizer solution  Commonly known as:  ATROVENT  Take 2.5 mLs (0.5 mg total) by nebulization every 6 (six) hours.     LORazepam 0.5 MG tablet  Commonly known as:  ATIVAN  Take one tablet by mouth every night at bedtime as needed for anxiety     losartan 50 MG tablet  Commonly known as:  COZAAR  Take 1 tablet by mouth daily.     meclizine 25 MG tablet  Commonly known as:  ANTIVERT  Take 6.25-12.5 mg by mouth 3 (three) times daily as needed for dizziness.     metFORMIN 500 MG tablet  Commonly known as:  GLUCOPHAGE  Take 500 mg by mouth 2 (two) times daily with a meal. If cbg is greater than 120 pt takes medication     multivitamin with minerals Tabs tablet  Take 1 tablet by mouth daily.     polyethylene glycol packet  Commonly known as:  MIRALAX / GLYCOLAX  Take 17 g by mouth daily.     potassium chloride 20 MEQ packet  Commonly known as:  KLOR-CON  Take 20 mEq by mouth daily.     PROVENTIL HFA 108 (90 BASE) MCG/ACT inhaler  Generic drug:  albuterol  Inhale 2 puffs into the lungs every 6 (six) hours as needed for wheezing.     spironolactone 12.5 mg Tabs tablet  Commonly known as:  ALDACTONE  Take 0.5 tablets (12.5 mg total) by mouth daily.     vitamin C 1000 MG tablet  Take 1,000 mg by mouth daily.         Duration of Discharge Encounter: Greater than 30 minutes including physician time.  Angelena Form PA-C 01/30/2014 9:14 AM

## 2014-01-30 NOTE — Discharge Instructions (Signed)
Heart Failure °Heart failure is a condition in which the heart has trouble pumping blood. This means your heart does not pump blood efficiently for your body to work well. In some cases of heart failure, fluid may back up into your lungs or you may have swelling (edema) in your lower legs. Heart failure is usually a long-term (chronic) condition. It is important for you to take good care of yourself and follow your caregiver's treatment plan. °CAUSES  °Some health conditions can cause heart failure. Those health conditions include: °· High blood pressure (hypertension) causes the heart muscle to work harder than normal. When pressure in the blood vessels is high, the heart needs to pump (contract) with more force in order to circulate blood throughout the body. High blood pressure eventually causes the heart to become stiff and weak. °· Coronary artery disease (CAD) is the buildup of cholesterol and fat (plaque) in the arteries of the heart. The blockage in the arteries deprives the heart muscle of oxygen and blood. This can cause chest pain and may lead to a heart attack. High blood pressure can also contribute to CAD. °· Heart attack (myocardial infarction) occurs when 1 or more arteries in the heart become blocked. The loss of oxygen damages the muscle tissue of the heart. When this happens, part of the heart muscle dies. The injured tissue does not contract as well and weakens the heart's ability to pump blood. °· Abnormal heart valves can cause heart failure when the heart valves do not open and close properly. This makes the heart muscle pump harder to keep the blood flowing. °· Heart muscle disease (cardiomyopathy or myocarditis) is damage to the heart muscle from a variety of causes. These can include drug or alcohol abuse, infections, or unknown reasons. These can increase the risk of heart failure. °· Lung disease makes the heart work harder because the lungs do not work properly. This can cause a strain  on the heart, leading it to fail. °· Diabetes increases the risk of heart failure. High blood sugar contributes to high fat (lipid) levels in the blood. Diabetes can also cause slow damage to tiny blood vessels that carry important nutrients to the heart muscle. When the heart does not get enough oxygen and food, it can cause the heart to become weak and stiff. This leads to a heart that does not contract efficiently. °· Other conditions can contribute to heart failure. These include abnormal heart rhythms, thyroid problems, and low blood counts (anemia). °Certain unhealthy behaviors can increase the risk of heart failure. Those unhealthy behaviors include: °· Being overweight. °· Smoking or chewing tobacco. °· Eating foods high in fat and cholesterol. °· Abusing illicit drugs or alcohol. °· Lacking physical activity. °SYMPTOMS  °Heart failure symptoms may vary and can be hard to detect. Symptoms may include: °· Shortness of breath with activity, such as climbing stairs. °· Persistent cough. °· Swelling of the feet, ankles, legs, or abdomen. °· Unexplained weight gain. °· Difficulty breathing when lying flat (orthopnea). °· Waking from sleep because of the need to sit up and get more air. °· Rapid heartbeat. °· Fatigue and loss of energy. °· Feeling lightheaded, dizzy, or close to fainting. °· Loss of appetite. °· Nausea. °· Increased urination during the night (nocturia). °DIAGNOSIS  °A diagnosis of heart failure is based on your history, symptoms, physical examination, and diagnostic tests. °Diagnostic tests for heart failure may include: °· Echocardiography. °· Electrocardiography. °· Chest X-ray. °· Blood tests. °· Exercise   stress test. °· Cardiac angiography. °· Radionuclide scans. °TREATMENT  °Treatment is aimed at managing the symptoms of heart failure. Medicines, behavioral changes, or surgical intervention may be necessary to treat heart failure. °· Medicines to help treat heart failure may  include: °· Angiotensin-converting enzyme (ACE) inhibitors. This type of medicine blocks the effects of a blood protein called angiotensin-converting enzyme. ACE inhibitors relax (dilate) the blood vessels and help lower blood pressure. °· Angiotensin receptor blockers. This type of medicine blocks the actions of a blood protein called angiotensin. Angiotensin receptor blockers dilate the blood vessels and help lower blood pressure. °· Water pills (diuretics). Diuretics cause the kidneys to remove salt and water from the blood. The extra fluid is removed through urination. This loss of extra fluid lowers the volume of blood the heart pumps. °· Beta blockers. These prevent the heart from beating too fast and improve heart muscle strength. °· Digitalis. This increases the force of the heartbeat. °· Healthy behavior changes include: °· Obtaining and maintaining a healthy weight. °· Stopping smoking or chewing tobacco. °· Eating heart healthy foods. °· Limiting or avoiding alcohol. °· Stopping illicit drug use. °· Physical activity as directed by your caregiver. °· Surgical treatment for heart failure may include: °· A procedure to open blocked arteries, repair damaged heart valves, or remove damaged heart muscle tissue. °· A pacemaker to improve heart muscle function and control certain abnormal heart rhythms. °· An internal cardioverter defibrillator to treat certain serious abnormal heart rhythms. °· A left ventricular assist device to assist the pumping ability of the heart. °HOME CARE INSTRUCTIONS  °· Take your medicine as directed by your caregiver. Medicines are important in reducing the workload of your heart, slowing the progression of heart failure, and improving your symptoms. °· Do not stop taking your medicine unless directed by your caregiver. °· Do not skip any dose of medicine. °· Refill your prescriptions before you run out of medicine. Your medicines are needed every day. °· Take over-the-counter  medicine only as directed by your caregiver or pharmacist. °· Engage in moderate physical activity if directed by your caregiver. Moderate physical activity can benefit some people. The elderly and people with severe heart failure should consult with a caregiver for physical activity recommendations. °· Eat heart healthy foods. Food choices should be free of trans fat and low in saturated fat, cholesterol, and salt (sodium). Healthy choices include fresh or frozen fruits and vegetables, fish, lean meats, legumes, fat-free or low-fat dairy products, and whole grain or high fiber foods. Talk to a dietitian to learn more about heart healthy foods. °· Limit sodium if directed by your caregiver. Sodium restriction may reduce symptoms of heart failure in some people. Talk to a dietitian to learn more about heart healthy seasonings. °· Use healthy cooking methods. Healthy cooking methods include roasting, grilling, broiling, baking, poaching, steaming, or stir-frying. Talk to a dietitian to learn more about healthy cooking methods. °· Limit fluids if directed by your caregiver. Fluid restriction may reduce symptoms of heart failure in some people. °· Weigh yourself every day. Daily weights are important in the early recognition of excess fluid. You should weigh yourself every morning after you urinate and before you eat breakfast. Wear the same amount of clothing each time you weigh yourself. Record your daily weight. Provide your caregiver with your weight record. °· Monitor and record your blood pressure if directed by your caregiver. °· Check your pulse if directed by your caregiver. °· Lose weight if directed   by your caregiver. Weight loss may reduce symptoms of heart failure in some people. °· Stop smoking or chewing tobacco. Nicotine makes your heart work harder by causing your blood vessels to constrict. Do not use nicotine gum or patches before talking to your caregiver. °· Schedule and attend follow-up visits as  directed by your caregiver. It is important to keep all your appointments. °· Limit alcohol intake to no more than 1 drink per day for nonpregnant women and 2 drinks per day for men. Drinking more than that is harmful to your heart. Tell your caregiver if you drink alcohol several times a week. Talk with your caregiver about whether alcohol is safe for you. If your heart has already been damaged by alcohol or you have severe heart failure, drinking alcohol should be stopped completely. °· Stop illicit drug use. °· Stay up-to-date with immunizations. It is especially important to prevent respiratory infections through current pneumococcal and influenza immunizations. °· Manage other health conditions such as hypertension, diabetes, thyroid disease, or abnormal heart rhythms as directed by your caregiver. °· Learn to manage stress. °· Plan rest periods when fatigued. °· Learn strategies to manage high temperatures. If the weather is extremely hot: °· Avoid vigorous physical activity. °· Use air conditioning or fans or seek a cooler location. °· Avoid caffeine and alcohol. °· Wear loose-fitting, lightweight, and light-colored clothing. °· Learn strategies to manage cold temperatures. If the weather is extremely cold: °· Avoid vigorous physical activity. °· Layer clothes. °· Wear mittens or gloves, a hat, and a scarf when going outside. °· Avoid alcohol. °· Obtain ongoing education and support as needed. °· Participate or seek rehabilitation as needed to maintain or improve independence and quality of life. °SEEK MEDICAL CARE IF:  °· Your weight increases by 03 lb/1.4 kg in 1 day or 05 lb/2.3 kg in a week. °· You have increasing shortness of breath that is unusual for you. °· You are unable to participate in your usual physical activities. °· You tire easily. °· You cough more than normal, especially with physical activity. °· You have any or more swelling in areas such as your hands, feet, ankles, or abdomen. °· You  are unable to sleep because it is hard to breathe. °· You feel like your heart is beating fast (palpitations). °· You become dizzy or lightheaded upon standing up. °SEEK IMMEDIATE MEDICAL CARE IF:  °· You have difficulty breathing. °· There is a change in mental status such as decreased alertness or difficulty with concentration. °· You have a pain or discomfort in your chest. °· You have an episode of fainting (syncope). °MAKE SURE YOU:  °· Understand these instructions. °· Will watch your condition. °· Will get help right away if you are not doing well or get worse. °Document Released: 11/28/2005 Document Revised: 03/25/2013 Document Reviewed: 12/20/2012 °[employer]® Patient Information ©2014 [employer]. ° °

## 2014-01-30 NOTE — Progress Notes (Signed)
Pt being dc to home, dc instructions given to pt, pt verbalized understanding pt left via wheelchair, pt stable

## 2014-01-30 NOTE — Progress Notes (Signed)
Subjective:  No SOB  Objective:  Vital Signs in the last 24 hours: Temp:  [97.3 F (36.3 C)-98 F (36.7 C)] 97.3 F (36.3 C) (02/19 0500) Pulse Rate:  [46-95] 46 (02/19 0500) Resp:  [17-18] 17 (02/19 0500) BP: (94-119)/(48-57) 94/48 mmHg (02/19 0500) SpO2:  [96 %-99 %] 96 % (02/19 0500) Weight:  [252 lb 3.3 oz (114.4 kg)] 252 lb 3.3 oz (114.4 kg) (02/19 0500)  Intake/Output from previous day:  Intake/Output Summary (Last 24 hours) at 01/30/14 0749 Last data filed at 01/30/14 0522  Gross per 24 hour  Intake   1323 ml  Output   2753 ml  Net  -1430 ml    Physical Exam: General appearance: alert, cooperative, no distress and morbidly obese Lungs: clear to auscultation bilaterally Heart: irregularly irregular rhythm Extremities: trace edema   Rate: 45-65  Rhythm: atrial fibrillation  Lab Results: No results found for this basename: WBC, HGB, PLT,  in the last 72 hours  Recent Labs  01/28/14 0435 01/30/14 0523  NA 140 141  K 4.1 4.3  CL 98 96  CO2 30 31  GLUCOSE 100* 102*  BUN 49* 42*  CREATININE 0.89 0.91   No results found for this basename: TROPONINI, CK, MB,  in the last 72 hours No results found for this basename: INR,  in the last 72 hours  Imaging: Imaging results have been reviewed  Cardiac Studies:  Assessment/Plan:   Principal Problem:   Acute on chronic diastolic heart failure Active Problems:   PULMONARY SARCOIDOSIS   Obstructive sleep apnea   Obesity hypoventilation syndrome   COPD (chronic obstructive pulmonary disease)   Atrial fibrillation, persistent   Diabetes mellitus, type II   Pulmonary hypertension- PA pressure 46 mmHg   Breast cancer, stage 1 s/p rt lumpectomy and radiation 2012   Hyperlipidemia   Chronic anticoagulation, on Eliquis   Chronic diastolic CHF (congestive heart failure)   Hypertension    PLAN: HR and B/P a little low. Will review meds with MD. I received a information yesterday that several forms of Diltiazem  are not available so a lower dose of Diltiazem may not be an option-? Change to low dose beta blocker for rate control. Decrease K+ supplement. TCM follow up at discharge.  Kerin Ransom PA-C Beeper [phone] 01/30/2014, 7:49 AM   I have seen and examined the patient along with Kerin Ransom PA-C.  I have reviewed the chart, notes and new data.  I agree with PA's note.  PLAN: BP is borderline low, but she is asymptomatic even when walking the hall. She is very sedentary when at home. I think current BP and medical regimen is satisfactory. She needs home health and early office follow up  Sanda Klein, MD, [facility] [phone] 01/30/2014, 8:51 AM

## 2014-01-31 ENCOUNTER — Ambulatory Visit (INDEPENDENT_AMBULATORY_CARE_PROVIDER_SITE_OTHER): Payer: Medicare Other | Admitting: Internal Medicine

## 2014-01-31 ENCOUNTER — Telehealth (HOSPITAL_COMMUNITY): Payer: Self-pay | Admitting: Surgery

## 2014-01-31 ENCOUNTER — Encounter: Payer: Self-pay | Admitting: Internal Medicine

## 2014-01-31 VITALS — BP 130/76 | HR 62 | Ht 59.0 in | Wt 249.0 lb

## 2014-01-31 DIAGNOSIS — J9691 Respiratory failure, unspecified with hypoxia: Secondary | ICD-10-CM

## 2014-01-31 DIAGNOSIS — D869 Sarcoidosis, unspecified: Secondary | ICD-10-CM

## 2014-01-31 DIAGNOSIS — E662 Morbid (severe) obesity with alveolar hypoventilation: Secondary | ICD-10-CM

## 2014-01-31 DIAGNOSIS — G4733 Obstructive sleep apnea (adult) (pediatric): Secondary | ICD-10-CM

## 2014-01-31 DIAGNOSIS — J9600 Acute respiratory failure, unspecified whether with hypoxia or hypercapnia: Secondary | ICD-10-CM

## 2014-01-31 NOTE — Progress Notes (Signed)
Patient ID: [name], female    DOB: [birth year], 73 y.o.   MRN: [id number]  HPI  06/13/11- 79 yoF never smoker, hx of OSA on CPAP Hx Sarcoid,, PAF-chronic coumadin , Morbid obesity   Last here December 21, 2010 - Note reviewed Blames heat for lack energy. She also had right breast lumpectomy and XRT in February. Now pending right knee replacement next week.  Got good report at cardiology last week, following for her AFIb. She had stopped ritalin as ineffective for complaints of tiredness before.  Continues CPAPat 9 cwp.   08/05/2011 Follow up  Pt presents today for follow up of O2. PT recently had R. TKR on 06/20/11 w/ rehab stay. She did have some desaturations post op and was started on o2 at 2l /m . Per pt she was discharged to rehab on o2. She had swelling in right lower leg and had venous doppler that was neg for DVT. She is on chronic coumadin for hx of a fib. She says she does wear out easily and has DOE. Does fine at rest w/ no dyspnea.  Wants to see if she can get off O2. Today in office O2 sat at rest was 94%. Walking O2 sat is 92% w/out desaturations.  Denies chest pain or hemoptysis. Weight is down 6 lbs since last ov.  No increased cough or congestion  Wearing CPAP each night  She was discharge home on 08/02/11 . No records from rehab available at todays visit. Will attempt to obtain.   09/22/11-69 yoF never smoker, hx of OSA on CPAP Hx Sarcoid,, PAF-chronic coumadin , Morbid obesity She is staying on her oxygen most of the time. Consider comfortably on room air for a while at rest is is it for sleep and exertion. She is having difficulty getting around with a walker and managing her oxygen tank. Her home care company says it does not have a small portable type that would work for her. She continues her CPAP every night/[facility]. Says she has lost 22 pounds since her knee replacement surgery.  1//3/12- - 69 yoF never smoker, hx of OSA on CPAP Hx Sarcoid,, PAF-chronic coumadin ,  Morbid obesity Still having SOB, slight wheezing at times. cough-productive-clear in color; denies any fever and chills She got portable oxygen/Advanced. Comfortable with her CPAP and using it every night 3 at Has felt sick since Thanksgiving. Now she just doesn't feel she can clear her airways of mucus. She took 2 rounds of antibiotics. Using her nebulizer machine.  05/04/12- 20 yoF never smoker, hx of OSA on CPAP Hx Sarcoid,, PAF-chronic coumadin , Morbid obesity Breathing is fine as along as using O2 as needed Uses oxygen at 2 L for sleep and when needed. She may need to be requalified. Occasional minor wheeze in the mornings does not bother her enough to use her rescue inhaler.  11/02/12- 44 yoF never smoker, hx of OSA on CPAP Hx Sarcoid,, PAF-chronic coumadin , Morbid obesity FOLLOWS FOR: SOB (mostly with activity) and wheezing; Still using O2 as needed Has had flu vaccine. She is pleased to report no issues at all with no recent colds or respiratory events. She is trying to lose some weight. Using oxygen 2L/ Advanced for sleep and if needed.  06/06/13-71 yoF never smoker, hx of OSA on CPAP Hx Sarcoid,, PAFib-chronic coumadin , Morbid obesity Review of Systems-see HPI FOLLOWS FOR: Post hospital; Continues to have SOB-worse with heat Hospitalized with aspiration pneumonia and then with acute GI  bleed. Now denies abdominal pain, nausea obvious blood loss. She was left on theophylline. Still feels "washed out and tired" CT chest 05/11/13- IMPRESSION:  1. No evidence pulmonary embolism.  2. Right greater than left lower lobe predominant airspace  disease, most consistent with infection.  3. Scattered pulmonary nodules. Nonspecific, especially given the  clinical history of sarcoidosis and breast cancer. Consider short-  term CT follow-up at 3 months to exclude metastasis.  4. Thoracic adenopathy, which could be reactive or due to  sarcoidosis. Metastatic disease cannot be excluded. This  could  also be reevaluated at follow-up.  5. Small hiatal hernia.  6. 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  Original Report Authenticated By: Abigail Miyamoto, M.D  08/08/13- 71 yoF never smoker, hx of OSA on CPAP Hx Sarcoid,, PAF-chronic coumadin , Morbid obesity FOLLOWS FOR: stay indoors as much as possible due to heat; denies any wheezing, SOB, cough, or congestion. Nausea and vomiting 2 days ago and again this morning, starting after she was up and around. No blood or pain. We think aspiration caused the pneumonia for which she was hospitalized earlier this year. Oxygen 2 L/Advanced CPAP 9  10/31/13- 72 yoF never smoker, hx of OSA on CPAP Hx Sarcoid,, PAF-chronic coumadin , Morbid obesity Pt suspects pna.  Pt c/o nonprod cough, SOB, wheezing. She had right greater than left effusions on CT scan in October, addressed with diuretic. Describes cough off-and-on for 5 or 6 weeks, nonproductive. Nebulizer helps some. No fever or chest pain. Has had several rounds of antibiotics at [facility]. Continues CPAP 9 with oxygen 2 L/Advanced CT chest 09/26/13 IMPRESSION:  1. Stable small bilateral pulmonary nodules. Accordingly these were  not acute inflammatory nodules on the prior exam from May 2014.  Scattered pulmonary nodules were noted on the prior CT chest from  2002 which is not available for direct comparison. Accordingly,  these likely represent benign chronic nodules, possibly associated  with old granulomatous disease or sarcoidosis. It may be prudent to  continue to monitor with chest CT in 6-12 months.  2. New moderate right and small left pleural effusions with passive  atelectasis.  3. Lower lobe airway thickening bilaterally, causing luminal  narrowing and contributing to the underlying atelectasis in the lung  bases.  4. Skin thickening in the right breast with inverted nipple. Skin  thickening was also noted on the breast ultrasound 1 month  ago.  Differential diagnosis: Continued mastitis versus inflammatory  breast cancer. Correlate with any clinical improvement/clinical  signs. Biopsy may be warranted.  5. Mediastinal adenopathy is mostly stable, although an AP window  lymph node is slightly more prominent. This could be due to passive  congestion, sarcoidosis, or malignancy.  Electronically Signed  By: Sherryl Barters M.D.  On: 09/20/2013 14:33  01/31/14-  72 yoF never smoker, hx of OSA on CPAP Hx Sarcoid,, PAF-chronic coumadin , Morbid obesity FOLLOWS FOR:  Breathing doing well--No concerns today.  Wearing CPAP 9/ O2 2L/  6-8 hours per night [facility]. Hospitalized 2/13-19 - acute on chronic diastolic congestive heart failure. Still weak and tired. CXR 10/31/13 IMPRESSION:  1. Right pleural effusion.  2. Bibasilar atelectasis versus infiltrates.  Similar findings noted on recent chest CT of 09/20/2013. These  findings are new from prior chest x-ray of 08/18/2013 .  Electronically Signed  By: Marcello Moores Register  On: 10/31/2013 15:42  ROS-see HPI Constitutional:  No--weight loss, no-night sweats, fevers, chills, +fatigue, lassitude. HEENT:   No-  headaches, difficulty swallowing, tooth/dental problems, sore throat,       No-  sneezing, itching, ear ache, nasal congestion, post nasal drip,  CV:  No-   chest pain, orthopnea, PND, swelling in lower extremities, anasarca,  dizziness, palpitations Resp: + shortness of breath with exertion, not at rest.              No-   productive cough, non-productive cough,  No-  coughing up of blood.              No-   change in color of mucus.  No- wheezing.   Skin: No-   rash or lesions. GI:  No-   heartburn, indigestion, abdominal pain, nausea, vomiting, GU:  MS:  No-   joint pain or swelling.   Neuro-  nothing unusual Psych:  No- change in mood or affect. No depression or anxiety.  No memory loss.   Objective:   Physical Exam General- Alert, Oriented, Affect-appropriate,  Distress- none acute, morbidly obese, wheelchair portable oxygen 2L Skin- looks pale Lymphadenopathy- none Head- atraumatic            Eyes- Gross vision intact, PERRLA, conjunctivae clear secretions            Ears- Hearing, canals-normal            Nose- Clear, no-Septal dev, mucus, polyps, erosion, perforation             Throat- Mallampati II , mucosa clear , drainage- none, tonsils- atrophic Neck- flexible , trachea midline, no stridor , thyroid nl, carotid no bruit Chest - symmetrical excursion , unlabored           Heart/CV- nearly regular with occasional skipped , no murmur , no gallop  , no rub, nl s1                s2              - JVD- none , 1+ edema bilaterally/ elastic hose, stasis changes- none, varices-                                    none  Lung-  +bilateral rhonchi/rales,  no- cough , dullness-none, rub- none           Chest wall-  Abd-  Br/ Gen/ Rectal- Not done, not indicated Extrem- +Walker Neuro- grossly intact to observation

## 2014-01-31 NOTE — Telephone Encounter (Signed)
I called [facility] regarding [name] Spironolactone  prescription and was able to call in prescription as well as set up delivery for tonite.  She will receive medication tonite in order to take appropriate dose.

## 2014-01-31 NOTE — Telephone Encounter (Signed)
I called Ms. Mesta to check on her and to see if she had received my message left yesterday regarding her follow-up appt.  She acknowledged that she received message and that she would be unable to make appointment until March 6th secondary to transportation issues.  She says that she rescheduled her follow-up appointment to accommodate that issue.  She says that she has been unable to get Spironolactone prescription filled due to the fact that they have to pick up her prescription and deliver the medication--that could not happen until next Monday.  She has not had any doses since discharge yesterday.  I am unsure at this time if that can be rectified until Monday.

## 2014-01-31 NOTE — Patient Instructions (Signed)
We can continue CPAP 9 and O2 2L for sleep/ [facility]  Please call as needed

## 2014-02-04 NOTE — Telephone Encounter (Signed)
Pt doing well since discharge. She says she has a "cold". No SOB. She denies any medications issues. She will keep her apt with Dr Debara Pickett.  Kerin Ransom PA-C 02/04/2014 2:15 PM

## 2014-02-05 ENCOUNTER — Ambulatory Visit: Payer: Medicare Other | Admitting: Physician Assistant

## 2014-02-13 ENCOUNTER — Telehealth: Payer: Self-pay | Admitting: *Deleted

## 2014-02-13 NOTE — Telephone Encounter (Signed)
Faxed home health certification & plan of care, plan of treatment to [facility]

## 2014-02-14 ENCOUNTER — Ambulatory Visit (INDEPENDENT_AMBULATORY_CARE_PROVIDER_SITE_OTHER): Payer: Medicare Other | Admitting: Cardiology

## 2014-02-14 VITALS — BP 110/70 | HR 72 | Ht 59.0 in | Wt 253.0 lb

## 2014-02-14 DIAGNOSIS — I5032 Chronic diastolic (congestive) heart failure: Secondary | ICD-10-CM

## 2014-02-14 DIAGNOSIS — I509 Heart failure, unspecified: Secondary | ICD-10-CM

## 2014-02-14 DIAGNOSIS — I4891 Unspecified atrial fibrillation: Secondary | ICD-10-CM

## 2014-02-14 DIAGNOSIS — I4819 Other persistent atrial fibrillation: Secondary | ICD-10-CM

## 2014-02-14 DIAGNOSIS — Z7901 Long term (current) use of anticoagulants: Secondary | ICD-10-CM

## 2014-02-14 NOTE — Progress Notes (Signed)
Patient ID: Sharon MILLETT, female   DOB: June 18, 1941, 73 y.o.   MRN: [id number]    02/17/2014 RAFFAELLA EDISON   April 01, 1941  [id number]  Primary Physicia Shirline Frees, MD Primary Cardiologist: Dr. Debara Pickett  HPI:  [name] presents to clinic today for post-hospital f/u. She is a 73 y/o female followed by Dr Debara Pickett with chronic AF on Eliquis, COPD, chronic diastolic CHF (EF 71-06% 26/94), breast cancer, and morbid obesity with hypoventilation syndrome. She lives alone but she has a son that lives nearby. Her mobility is limited, she uses a walker. She was admitted to [facility] on 01/24/14 with acute on chronic diastolic CHF. Her wgt was 259 lbs. She was started on IV diuretics. She diuresed 7 lbs with improvement in her symptoms. She was discharged on 01/30/14. Both a home health RN and PT were arranged at time of discharge.   She presents to clinic today without complaints. She reports that she has been doing well. She denies SOB, orthopnea/PND and LEE. No chest pain or palpitations. She reports daily compliance with her medications and adherence to a low sodium diet. She weighs herself daily at home. Her dry weight, base on her home scale is 240lb. She denies any significant weight gain. She has also been compliant with her Eliquis. She denies any abnormal bleeding and no falls.    Current Outpatient Prescriptions  Medication Sig Dispense Refill  . acetaminophen (TYLENOL) 500 MG tablet Take 1,000 mg by mouth every 6 (six) hours as needed. pain      . albuterol (PROVENTIL HFA) 108 (90 BASE) MCG/ACT inhaler Inhale 2 puffs into the lungs every 6 (six) hours as needed for wheezing.      Marland Kitchen allopurinol (ZYLOPRIM) 100 MG tablet Take 200 mg by mouth daily.       Marland Kitchen apixaban (ELIQUIS) 5 MG TABS tablet Take 1 tablet (5 mg total) by mouth 2 (two) times daily.  60 tablet  6  . Ascorbic Acid (VITAMIN C) 1000 MG tablet Take 1,000 mg by mouth daily.      Marland Kitchen CALCIUM PO Take by mouth 2 (two) times daily.      . colchicine  0.6 MG tablet Take 1 tablet (0.6 mg total) by mouth 2 (two) times daily.  60 tablet  0  . diltiazem (CARDIZEM CD) 240 MG 24 hr capsule Take 1 capsule by mouth daily.      Marland Kitchen exemestane (AROMASIN) 25 MG tablet Take 1 tablet (25 mg total) by mouth daily after breakfast.  30 tablet  12  . ferrous fumarate (HEMOCYTE - 106 MG FE) 325 (106 FE) MG TABS Take 1 tablet by mouth 2 (two) times daily.       . Fluticasone-Salmeterol (ADVAIR) 250-50 MCG/DOSE AEPB Inhale 1 puff into the lungs 2 (two) times daily.      . furosemide (LASIX) 80 MG tablet Take 1 tablet (80 mg total) by mouth 2 (two) times daily.  180 tablet  3  . guaiFENesin (MUCINEX) 600 MG 12 hr tablet Take 1,200 mg by mouth 2 (two) times daily.      Marland Kitchen HUMULIN N 100 UNIT/ML injection Inject 5 Units into the skin as needed (5 units if blood sugar is greater than 150).       . insulin glargine (LANTUS) 100 UNIT/ML injection Inject 0.28 mLs (28 Units total) into the skin at bedtime.  10 mL  12  . LORazepam (ATIVAN) 0.5 MG tablet Take one tablet by mouth every night at bedtime  as needed for anxiety  30 tablet  0  . losartan (COZAAR) 50 MG tablet Take 1 tablet by mouth daily.      . meclizine (ANTIVERT) 25 MG tablet Take 6.25-12.5 mg by mouth 3 (three) times daily as needed for dizziness.       . metFORMIN (GLUCOPHAGE) 500 MG tablet Take 500 mg by mouth 2 (two) times daily with a meal. If cbg is greater than 120 pt takes medication      . Multiple Vitamin (MULTIVITAMIN WITH MINERALS) TABS Take 1 tablet by mouth daily.      . polyethylene glycol (MIRALAX / GLYCOLAX) packet Take 17 g by mouth daily.  14 each  0  . potassium chloride (KLOR-CON) 20 MEQ packet Take 20 mEq by mouth daily.      Marland Kitchen spironolactone (ALDACTONE) 12.5 mg TABS tablet Take 0.5 tablets (12.5 mg total) by mouth daily.  45 tablet  3  . ipratropium (ATROVENT) 0.02 % nebulizer solution Take 2.5 mLs (0.5 mg total) by nebulization every 6 (six) hours.  75 mL  0   No current  facility-administered medications for this visit.    Allergies  Allergen Reactions  . Aspirin     Avoids due to being on blood thinners  . Contrast Media [Iodinated Diagnostic Agents] Nausea And Vomiting  . Ibuprofen     Avoids due to being on blood thinners  . Pravachol Other (See Comments)    Muscle pain  . Pravastatin Sodium     History   Social History  . Marital Status: Divorced    Spouse Name: N/A    Number of Children: 2  . Years of Education: N/A   Occupational History  . RETIRED     [employer]   Social History Main Topics  . Smoking status: Never Smoker   . Smokeless tobacco: Never Used  . Alcohol Use: No  . Drug Use: No  . Sexual Activity: No   Other Topics Concern  . Not on file   Social History Narrative   Widowed.  Lives alone.  Ambulates with a walker.     Review of Systems: General: negative for chills, fever, night sweats or weight changes.  Cardiovascular: negative for chest pain, dyspnea on exertion, edema, orthopnea, palpitations, paroxysmal nocturnal dyspnea or shortness of breath Dermatological: negative for rash Respiratory: negative for cough or wheezing Urologic: negative for hematuria Abdominal: negative for nausea, vomiting, diarrhea, bright red blood per rectum, melena, or hematemesis Neurologic: negative for visual changes, syncope, or dizziness All other systems reviewed and are otherwise negative except as noted above.    Blood pressure 110/70, pulse 72, height 4\' 11"  (1.499 m), weight 253 lb (114.76 kg).  General appearance: alert, cooperative, no distress and moderately obese Neck: No JVD Lungs: clear to auscultation bilaterally Heart: irregularly irregular rhythm Extremities: no LEE Pulses: 2+ and symmetric Skin: warm and dry Neurologic: Grossly normal  ASSESSMENT AND PLAN:   Chronic diastolic CHF (congestive heart failure) Euvolmeic on physical exam today. No recent complaints of SOB, orthopnea, PND, weight gain or LEE.  Continue current treatment plan of 80 mg of Lasix daily + potassium supplementation, ARB and spirolactone. She is not an a BB due to COPD. She is on Cardizem instead for rate control. Continue daily weights and low sodium diet. Pt knows to contact our office for Lasix dosing instructions if she gains more than 3 lb in a 24 hr period or more than 5 lb in 1 week.   Atrial  fibrillation, persistent Rate controlled in clinic today w/ ventricular rate in the 70s. Asymptomatic. BP stable. Continue Cardizem for rate control and Eliquis for A/C.   Chronic anticoagulation, on Eliquis Continue daily. She denies any signs of abnormal bleeding and no falls.     PLAN  Euvolemic on physical exam. No recent SOB, orthopnea, PND, LEE or weight gain. HR and BP both stable. Will continue current medications for diastolic HF and  chronic atrial fibrillation. I have encouraged continued medication compliance, daily weights and low sodium diet. She is due to see Dr. Debara Pickett back in May for her routien 6 month complete cardiovascular assemssment. She will schedule to appointment today.   SIMMONS, [place] 02/17/2014 6:22 PM

## 2014-02-14 NOTE — Patient Instructions (Addendum)
Continue taking medications as prescribed.  Continue to weigh yourself daily. Call our office if you gain more than 3 lb in a 24 hr period or more than 5 lb in 1 week. Continue with low salt diet. Continue taking Eliquis. Report any abnormal bleeding or falls.   Call our office if you develop severe chest pain or difficulty breathing.

## 2014-02-17 ENCOUNTER — Telehealth: Payer: Self-pay | Admitting: Internal Medicine

## 2014-02-17 ENCOUNTER — Encounter: Payer: Self-pay | Admitting: Cardiology

## 2014-02-17 NOTE — Assessment & Plan Note (Signed)
Euvolmeic on physical exam today. No recent complaints of SOB, orthopnea, PND, weight gain or LEE. Continue current treatment plan of 80 mg of Lasix daily + potassium supplementation, ARB and spirolactone. She is not an a BB due to COPD. She is on Cardizem instead for rate control. Continue daily weights and low sodium diet. Pt knows to contact our office for Lasix dosing instructions if she gains more than 3 lb in a 24 hr period or more than 5 lb in 1 week.

## 2014-02-17 NOTE — Telephone Encounter (Signed)
Please call,pt wanted you to know she had a weight gain of 5lbs from the weekend

## 2014-02-17 NOTE — Telephone Encounter (Signed)
Returned call and pt verified x 2.  Pt c/o weight gain of 5 lbs over the weekend.  Weight today is 245.0 lbs today and stated it has been 240 lbs.  Denied having SOB or swelling.  Pt confirmed she has been weighing the same time every day with the same amount of clothes on.  Pt has not been recording weights and unable to give account of daily weight.  Stated she weighed 240 lbs a couple of days ago and today 245 lbs.  Pt advised to keep a log of daily weights so that she will be able to give an accurate account of daily weights (use calendar to write down weights).  Pt informed provider will be notified for further instructions and she will be called back.  Pt verbalized understanding and agreed w/ plan.   Pt did confirm current dosages of furosemide, K+ and spironolactone.  Message forwarded to DOD (Dr. Sallyanne Kuster) to review and advise, as primary cardiologist is out of the office.

## 2014-02-17 NOTE — Telephone Encounter (Signed)
Returned call and informed pt per instructions by MD.  Pt verbalized understanding and agreed w/ plan.  Pt repeated instructions.

## 2014-02-17 NOTE — Assessment & Plan Note (Signed)
Rate controlled in clinic today w/ ventricular rate in the 70s. Asymptomatic. BP stable. Continue Cardizem for rate control and Eliquis for A/C.

## 2014-02-17 NOTE — Assessment & Plan Note (Signed)
Continue daily. She denies any signs of abnormal bleeding and no falls.

## 2014-02-17 NOTE — Telephone Encounter (Signed)
Increase furosemide to 160 mg AM + 80 mg PM for 2 days. Keep recording Weight. If she returns to 240 lb, resume previous furosemide dosing. If not, call us back in2 days

## 2014-02-18 ENCOUNTER — Ambulatory Visit (INDEPENDENT_AMBULATORY_CARE_PROVIDER_SITE_OTHER): Payer: Medicare Other | Admitting: General Surgery

## 2014-02-19 ENCOUNTER — Telehealth: Payer: Self-pay | Admitting: Internal Medicine

## 2014-02-19 NOTE — Telephone Encounter (Signed)
Returned call to patient. Amber, RN had triaged her on 3/9 regarding a 5lb weight gain (to 245lbs) over the weekend, of which Dr. Loletha Grayer advised she increase furosemide to 160 mg AM + 80 mg PM for 2 days, keep recording her weight and that if she returns to 240 lb, resume previous furosemide dosing - if not, call us back in 2 days. Patient reports today that her weight remains at 245lbs. She denies CP/SOB/no new edema - no complaints other than this weight.   Will defer to Kerin Ransom, PA-C for any advice regarding medicine adjustments, etc.

## 2014-02-19 NOTE — Telephone Encounter (Signed)
Was placed on Lasik and is calling to let us know that her weight has not come down , it is still the same .Marland Kitchen Please Call    Thanks

## 2014-02-19 NOTE — Telephone Encounter (Signed)
Returned call to patient and provided instruction per Kerin Ransom, PA-C to continue current Rx. Patient voiced understanding

## 2014-02-19 NOTE — Telephone Encounter (Signed)
Continue current Rx.

## 2014-02-24 NOTE — Assessment & Plan Note (Signed)
Obesity hypoventilation plus congestive heart failure

## 2014-02-24 NOTE — Assessment & Plan Note (Signed)
No success with weight loss

## 2014-02-24 NOTE — Assessment & Plan Note (Signed)
In remission.

## 2014-02-24 NOTE — Assessment & Plan Note (Signed)
Compliant with CPAP 

## 2014-03-07 ENCOUNTER — Telehealth: Payer: Self-pay | Admitting: Internal Medicine

## 2014-03-07 ENCOUNTER — Other Ambulatory Visit: Payer: Self-pay | Admitting: *Deleted

## 2014-03-07 DIAGNOSIS — C50919 Malignant neoplasm of unspecified site of unspecified female breast: Secondary | ICD-10-CM

## 2014-03-07 NOTE — Telephone Encounter (Signed)
Will probably need to be seen by a midlevel next week to assess why she is gaining weight, despite an increase in her diuretics. I'm not in the office.  Dr. Debara Pickett

## 2014-03-07 NOTE — Telephone Encounter (Signed)
Spoke to patient. She states she has has gain 10 lbs over a 2 week period weight (252-240). RN asked patient to give the last 5 days of wgt reading. This weeks Monday 244lbs; tues- 245lbs; wed ---;[id number] lbs, today 248lbs. Patient staes no swelling noted , little sob and cough  But nothing new ,she states it is the same. She has been taking 160 mg in morning (2 x 80 mg tablets) and 80 mg in afternoon  RN wil defer to Dr  Debara Pickett and contact patient

## 2014-03-07 NOTE — Telephone Encounter (Signed)
Spoke to patient.  Appointment  Schedule for 03/11/14 930 . Patient states she does not know if she can make the appt.  She will call back and change if nececssary

## 2014-03-07 NOTE — Telephone Encounter (Signed)
Needs to talk to nurse.  Says she is getting extra fluid.  Please call

## 2014-03-10 ENCOUNTER — Other Ambulatory Visit (HOSPITAL_BASED_OUTPATIENT_CLINIC_OR_DEPARTMENT_OTHER): Payer: Medicare Other

## 2014-03-10 ENCOUNTER — Encounter: Payer: Self-pay | Admitting: Oncology

## 2014-03-10 ENCOUNTER — Ambulatory Visit (HOSPITAL_BASED_OUTPATIENT_CLINIC_OR_DEPARTMENT_OTHER): Payer: Medicare Other | Admitting: Oncology

## 2014-03-10 VITALS — BP 125/72 | HR 86 | Temp 97.5°F | Resp 18 | Ht 59.0 in | Wt 262.0 lb

## 2014-03-10 DIAGNOSIS — C50419 Malignant neoplasm of upper-outer quadrant of unspecified female breast: Secondary | ICD-10-CM

## 2014-03-10 DIAGNOSIS — C50919 Malignant neoplasm of unspecified site of unspecified female breast: Secondary | ICD-10-CM

## 2014-03-10 DIAGNOSIS — Z170 Estrogen receptor positive status [ER+]: Secondary | ICD-10-CM

## 2014-03-10 DIAGNOSIS — D509 Iron deficiency anemia, unspecified: Secondary | ICD-10-CM

## 2014-03-10 DIAGNOSIS — M810 Age-related osteoporosis without current pathological fracture: Secondary | ICD-10-CM

## 2014-03-10 LAB — COMPREHENSIVE METABOLIC PANEL (CC13)
ALK PHOS: 136 U/L (ref 40–150)
ALT: 11 U/L (ref 0–55)
AST: 15 U/L (ref 5–34)
Albumin: 4 g/dL (ref 3.5–5.0)
Anion Gap: 11 mEq/L (ref 3–11)
BUN: 29.9 mg/dL — AB (ref 7.0–26.0)
CALCIUM: 9.8 mg/dL (ref 8.4–10.4)
CO2: 29 mEq/L (ref 22–29)
CREATININE: 0.9 mg/dL (ref 0.6–1.1)
Chloride: 102 mEq/L (ref 98–109)
GLUCOSE: 183 mg/dL — AB (ref 70–140)
Potassium: 4.7 mEq/L (ref 3.5–5.1)
Sodium: 141 mEq/L (ref 136–145)
Total Bilirubin: 0.65 mg/dL (ref 0.20–1.20)
Total Protein: 7 g/dL (ref 6.4–8.3)

## 2014-03-10 LAB — CBC WITH DIFFERENTIAL/PLATELET
BASO%: 0.2 % (ref 0.0–2.0)
BASOS ABS: 0 10*3/uL (ref 0.0–0.1)
EOS ABS: 0.4 10*3/uL (ref 0.0–0.5)
EOS%: 5 % (ref 0.0–7.0)
HCT: 44.3 % (ref 34.8–46.6)
HEMOGLOBIN: 14.5 g/dL (ref 11.6–15.9)
LYMPH%: 25.7 % (ref 14.0–49.7)
MCH: 28.7 pg (ref 25.1–34.0)
MCHC: 32.7 g/dL (ref 31.5–36.0)
MCV: 87.5 fL (ref 79.5–101.0)
MONO#: 0.6 10*3/uL (ref 0.1–0.9)
MONO%: 8.4 % (ref 0.0–14.0)
NEUT%: 60.7 % (ref 38.4–76.8)
NEUTROS ABS: 4.4 10*3/uL (ref 1.5–6.5)
PLATELETS: 186 10*3/uL (ref 145–400)
RBC: 5.06 10*6/uL (ref 3.70–5.45)
RDW: 16 % — ABNORMAL HIGH (ref 11.2–14.5)
WBC: 7.3 10*3/uL (ref 3.9–10.3)
lymph#: 1.9 10*3/uL (ref 0.9–3.3)

## 2014-03-10 MED ORDER — EXEMESTANE 25 MG PO TABS: 25.0000 mg | ORAL_TABLET | Freq: Every day | ORAL | Status: DC

## 2014-03-10 MED ORDER — ALENDRONATE SODIUM 70 MG PO TABS: 70.0000 mg | ORAL_TABLET | ORAL | Status: DC

## 2014-03-10 NOTE — Patient Instructions (Signed)
Continue aromasin 25 mg daily, prescription sent to your pharmacy  Bone Density: showed osteoporosis, begin fosamax once a week, to help strengthen the bones.  Take fosamax once a week on empty stomach with 1 full 16 oz of water, stay upright for 1 hour.   Alendronate tablets What is this medicine? ALENDRONATE (a LEN droe nate) slows calcium loss from bones. It helps to make normal healthy bone and to slow bone loss in people with Paget's disease and osteoporosis. It may be used in others at risk for bone loss. This medicine may be used for other purposes; ask your health care provider or pharmacist if you have questions. COMMON BRAND NAME(S): Fosamax What should I tell my health care provider before I take this medicine? They need to know if you have any of these conditions: -dental disease -esophagus, stomach, or intestine problems, like acid reflux or GERD -kidney disease -low blood calcium -low vitamin D -problems sitting or standing 30 minutes -trouble swallowing -an unusual or allergic reaction to alendronate, other medicines, foods, dyes, or preservatives -pregnant or trying to get pregnant -breast-feeding How should I use this medicine? You must take this medicine exactly as directed or you will lower the amount of the medicine you absorb into your body or you may cause yourself harm. Take this medicine by mouth first thing in the morning, after you are up for the day. Do not eat or drink anything before you take your medicine. Swallow the tablet with a full glass (6 to 8 fluid ounces) of plain water. Do not take this medicine with any other drink. Do not chew or crush the tablet. After taking this medicine, do not eat breakfast, drink, or take any medicines or vitamins for at least 30 minutes. Sit or stand up for at least 30 minutes after you take this medicine; do not lie down. Do not take your medicine more often than directed. Talk to your pediatrician regarding the use of this  medicine in children. Special care may be needed. Overdosage: If you think you have taken too much of this medicine contact a poison control center or emergency room at once. NOTE: This medicine is only for you. Do not share this medicine with others. What if I miss a dose? If you miss a dose, do not take it later in the day. Continue your normal schedule starting the next morning. Do not take double or extra doses. What may interact with this medicine? -aluminum hydroxide -antacids -aspirin -calcium supplements -drugs for inflammation like ibuprofen, naproxen, and others -iron supplements -magnesium supplements -vitamins with minerals This list may not describe all possible interactions. Give your health care provider a list of all the medicines, herbs, non-prescription drugs, or dietary supplements you use. Also tell them if you smoke, drink alcohol, or use illegal drugs. Some items may interact with your medicine. What should I watch for while using this medicine? Visit your doctor or health care professional for regular checks ups. It may be some time before you see benefit from this medicine. Do not stop taking your medicine except on your doctor's advice. Your doctor or health care professional may order blood tests and other tests to see how you are doing. You should make sure you get enough calcium and vitamin D while you are taking this medicine, unless your doctor tells you not to. Discuss the foods you eat and the vitamins you take with your health care professional. Some people who take this medicine have severe bone,  joint, and/or muscle pain. This medicine may also increase your risk for a broken thigh bone. Tell your doctor right away if you have pain in your upper leg or groin. Tell your doctor if you have any pain that does not go away or that gets worse. This medicine can make you more sensitive to the sun. If you get a rash while taking this medicine, sunlight may cause the rash  to get worse. Keep out of the sun. If you cannot avoid being in the sun, wear protective clothing and use sunscreen. Do not use sun lamps or tanning beds/booths. What side effects may I notice from receiving this medicine? Side effects that you should report to your doctor or health care professional as soon as possible: -allergic reactions like skin rash, itching or hives, swelling of the face, lips, or tongue -black or tarry stools -bone, muscle or joint pain -changes in vision -chest pain -heartburn or stomach pain -jaw pain, especially after dental work -pain or trouble when swallowing -redness, blistering, peeling or loosening of the skin, including inside the mouth Side effects that usually do not require medical attention (report to your doctor or health care professional if they continue or are bothersome): -changes in taste -diarrhea or constipation -eye pain or itching -headache -nausea or vomiting -stomach gas or fullness This list may not describe all possible side effects. Call your doctor for medical advice about side effects. You may report side effects to FDA at 1-800-FDA-1088. Where should I keep my medicine? Keep out of the reach of children. Store at room temperature of 15 and 30 degrees C (59 and 86 degrees F). Throw away any unused medicine after the expiration date. NOTE: This sheet is a summary. It may not cover all possible information. If you have questions about this medicine, talk to your doctor, pharmacist, or health care provider.  2014, Elsevier/Gold Standard. (2011-05-27 08:56:09)  Osteoporosis Throughout your life, your body breaks down old bone and replaces it with new bone. As you get older, your body does not replace bone as quickly as it breaks it down. By the age of 36 years, most people begin to gradually lose bone because of the imbalance between bone loss and replacement. Some people lose more bone than others. Bone loss beyond a specified normal  degree is considered osteoporosis.  Osteoporosis affects the strength and durability of your bones. The inside of the ends of your bones and your flat bones, like the bones of your pelvis, look like honeycomb, filled with tiny open spaces. As bone loss occurs, your bones become less dense. This means that the open spaces inside your bones become bigger and the walls between these spaces become thinner. This makes your bones weaker. Bones of a person with osteoporosis can become so weak that they can break (fracture) during minor accidents, such as a simple fall. CAUSES  The following factors have been associated with the development of osteoporosis:  Smoking.  Drinking more than 2 alcoholic drinks several days per week.  Long-term use of certain medicines:  Corticosteroids.  Chemotherapy medicines.  Thyroid medicines.  Antiepileptic medicines.  Gonadal hormone suppression medicine.  Immunosuppression medicine.  Being underweight.  Lack of physical activity.  Lack of exposure to the sun. This can lead to vitamin D deficiency.  Certain medical conditions:  Certain inflammatory bowel diseases, such as Crohn disease and ulcerative colitis.  Diabetes.  Hyperthyroidism.  Hyperparathyroidism. RISK FACTORS Anyone can develop osteoporosis. However, the following factors can increase your  risk of developing osteoporosis:  Gender Women are at higher risk than men.  Age Being older than 24 years increases your risk.  Ethnicity White and Asian people have an increased risk.  Weight Being extremely underweight can increase your risk of osteoporosis.  Family history of osteoporosis Having a family member who has developed osteoporosis can increase your risk. SYMPTOMS  Usually, people with osteoporosis have no symptoms.  DIAGNOSIS  Signs during a physical exam that may prompt your caregiver to suspect osteoporosis include:  Decreased height. This is usually caused by the  compression of the bones that form your spine (vertebrae) because they have weakened and become fractured.  A curving or rounding of the upper back (kyphosis). To confirm signs of osteoporosis, your caregiver may request a procedure that uses 2 low-dose X-ray beams with different levels of energy to measure your bone mineral density (dual-energy X-ray absorptiometry [DXA]). Also, your caregiver may check your level of vitamin D. TREATMENT  The goal of osteoporosis treatment is to strengthen bones in order to decrease the risk of bone fractures. There are different types of medicines available to help achieve this goal. Some of these medicines work by slowing the processes of bone loss. Some medicines work by increasing bone density. Treatment also involves making sure that your levels of calcium and vitamin D are adequate. PREVENTION  There are things you can do to help prevent osteoporosis. Adequate intake of calcium and vitamin D can help you achieve optimal bone mineral density. Regular exercise can also help, especially resistance and weight-bearing activities. If you smoke, quitting smoking is an important part of osteoporosis prevention. MAKE SURE YOU:  Understand these instructions.  Will watch your condition.  Will get help right away if you are not doing well or get worse. FOR MORE INFORMATION www.osteo.org and EquipmentWeek[employer].com.ee Document Released: 09/07/2005 Document Revised: 03/25/2013 Document Reviewed: 11/12/2011 [facility] Patient Information 2014 [place], [place].

## 2014-03-11 ENCOUNTER — Telehealth: Payer: Self-pay | Admitting: Oncology

## 2014-03-11 ENCOUNTER — Ambulatory Visit (INDEPENDENT_AMBULATORY_CARE_PROVIDER_SITE_OTHER): Payer: Medicare Other | Admitting: Cardiology

## 2014-03-11 ENCOUNTER — Telehealth: Payer: Self-pay | Admitting: Internal Medicine

## 2014-03-11 ENCOUNTER — Encounter: Payer: Self-pay | Admitting: Cardiology

## 2014-03-11 VITALS — BP 118/92 | HR 84 | Ht 59.0 in | Wt 264.7 lb

## 2014-03-11 DIAGNOSIS — Z79899 Other long term (current) drug therapy: Secondary | ICD-10-CM

## 2014-03-11 DIAGNOSIS — I4891 Unspecified atrial fibrillation: Secondary | ICD-10-CM

## 2014-03-11 DIAGNOSIS — R0602 Shortness of breath: Secondary | ICD-10-CM

## 2014-03-11 DIAGNOSIS — I5033 Acute on chronic diastolic (congestive) heart failure: Secondary | ICD-10-CM

## 2014-03-11 LAB — BASIC METABOLIC PANEL
BUN: 25 mg/dL — AB (ref 6–23)
CHLORIDE: 99 meq/L (ref 96–112)
CO2: 31 mEq/L (ref 19–32)
Calcium: 9.7 mg/dL (ref 8.4–10.5)
Creat: 0.68 mg/dL (ref 0.50–1.10)
Glucose, Bld: 164 mg/dL — ABNORMAL HIGH (ref 70–99)
Potassium: 4.5 mEq/L (ref 3.5–5.3)
SODIUM: 138 meq/L (ref 135–145)

## 2014-03-11 MED ORDER — METOLAZONE 5 MG PO TABS: 5.0000 mg | ORAL_TABLET | ORAL | Status: DC

## 2014-03-11 NOTE — Telephone Encounter (Signed)
Would like to know if she can increase her home visits?

## 2014-03-11 NOTE — Patient Instructions (Addendum)
Your physician has recommended you make the following change in your medication -  START metolazone 5 mg every other day (take 30 minutes prior to your AM dose of lasix)  Please have lab work TODAY  Your physician recommends that you schedule a follow-up appointment on Friday April 3rd with Mickel Baas, NP

## 2014-03-11 NOTE — Telephone Encounter (Signed)
Spoke to [place] - okay to increase home visit.-Chris states she will send order to be signed.

## 2014-03-11 NOTE — Telephone Encounter (Signed)
, °

## 2014-03-11 NOTE — Progress Notes (Signed)
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  #2 iron deficiency anemia   SUMMARY OF ONCOLOGIC HISTORY:  #1 patient is a lumpectomy of the right breast in May 2012. 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 2 sentinel nodes were negative for metastatic disease.  #2 she then went on to receive radiation therapy to the breast from 03/28/2011 to 04/25/2011.  #3 she is now status post right knee replacement and is getting physical therapy.  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   INTERVAL HISTORY: [name] 73 y.o. female returns for followup today. Her last visit was back in September 2014. Clinically from oncology perspective patient is doing well. Unfortunately she was admitted to the hospital with what sounds like diastolic heart failure atrial fibrillation and other cardiac issues. She continues to see her cardiologist Dr. Debara Pickett. She has been taking Aromasin without any problems. Patient did have a bone density scan performed a little while by. She does osteoporosis. I had recommended patient begin Fosamax. Unfortunately she has not been taking it. I have given her a prescription today and given her instructions on how to take it. We also discussed risks benefits and complications of Fosamax. If she does not tolerate Fosamax certainly she would be a good candidate for prolia.  I have reviewed the past medical history, past surgical history, social history and family history with the patient and they are unchanged from previous note.  ALLERGIES:   is allergic to aspirin; contrast media; ibuprofen; pravachol; and pravastatin sodium.  MEDICATIONS:  Current Outpatient Prescriptions  Medication Sig Dispense Refill  . acetaminophen (TYLENOL) 500 MG tablet Take 1,000 mg by mouth every 6 (six) hours as needed. pain      . albuterol (PROVENTIL HFA) 108 (90 BASE) MCG/ACT inhaler Inhale 2 puffs into the lungs every 6 (six) hours as needed for wheezing.      Marland Kitchen allopurinol (ZYLOPRIM) 100 MG tablet Take 200 mg by mouth daily.       Marland Kitchen apixaban (ELIQUIS) 5 MG TABS tablet Take 1 tablet (5 mg total) by mouth 2 (two) times daily.  60 tablet  6  . Ascorbic Acid (VITAMIN C) 1000 MG tablet Take 1,000 mg by mouth daily.      Marland Kitchen CALCIUM PO Take by mouth 2 (two) times daily.      . colchicine 0.6 MG tablet Take 1 tablet (0.6 mg total) by mouth 2 (two) times daily.  60 tablet  0  . diltiazem (CARDIZEM CD) 240 MG 24 hr capsule Take 1 capsule by mouth daily.      Marland Kitchen exemestane (AROMASIN) 25 MG tablet Take 1 tablet (25 mg total) by mouth daily after breakfast.  30 tablet  12  . ferrous fumarate (HEMOCYTE - 106 MG FE) 325 (106 FE) MG TABS Take 1 tablet by mouth 2 (two) times daily.       . Fluticasone-Salmeterol (ADVAIR) 250-50 MCG/DOSE AEPB Inhale 1 puff into the lungs 2 (two) times daily.      Marland Kitchen guaiFENesin (MUCINEX) 600 MG 12 hr tablet Take 1,200 mg by mouth 2 (two) times  daily.      . HUMULIN N 100 UNIT/ML injection Inject 5 Units into the skin as needed (5 units if blood sugar is greater than 150).       . insulin glargine (LANTUS) 100 UNIT/ML injection Inject 0.28 mLs (28 Units total) into the skin at bedtime.  10 mL  12  . ipratropium (ATROVENT) 0.02 % nebulizer solution Take 2.5 mLs (0.5 mg total) by nebulization every 6 (six) hours.  75 mL  0  . LORazepam (ATIVAN) 0.5 MG tablet Take one tablet by mouth every night at bedtime as needed for anxiety  30 tablet  0  . losartan (COZAAR) 50 MG tablet Take 1 tablet by mouth daily.      . meclizine (ANTIVERT) 25 MG  tablet Take 6.25-12.5 mg by mouth 3 (three) times daily as needed for dizziness.       . metFORMIN (GLUCOPHAGE) 500 MG tablet Take 500 mg by mouth 2 (two) times daily with a meal. If cbg is greater than 120 pt takes medication      . Multiple Vitamin (MULTIVITAMIN WITH MINERALS) TABS Take 1 tablet by mouth daily.      . polyethylene glycol (MIRALAX / GLYCOLAX) packet Take 17 g by mouth daily.  14 each  0  . potassium chloride (KLOR-CON) 20 MEQ packet Take 20 mEq by mouth daily.      . spironolactone (ALDACTONE) 12.5 mg TABS tablet Take 0.5 tablets (12.5 mg total) by mouth daily.  45 tablet  3  . alendronate (FOSAMAX) 70 MG tablet Take 1 tablet (70 mg total) by mouth once a week. Take with a full glass of water on an empty stomach.  12 tablet  6  . Cholecalciferol (VITAMIN D-3 PO) Take by mouth daily.      . Docusate Calcium (STOOL SOFTENER PO) Take by mouth daily.      . furosemide (LASIX) 80 MG tablet Take 80-160 mg by mouth 2 (two) times daily. 160mg AM 80mg PM      . metolazone (ZAROXOLYN) 5 MG tablet Take 1 tablet (5 mg total) by mouth every other day. Take 30 minutes prior to AM dose of lasix  45 tablet  3   No current facility-administered medications for this visit.    REVIEW OF SYSTEMS:   Constitutional: Denies fevers, chills or abnormal weight loss Eyes: Denies blurriness of vision Ears, nose, mouth, throat, and face: Denies mucositis or sore throat Respiratory: Denies cough, dyspnea or wheezes Cardiovascular: Denies palpitation, chest discomfort or lower extremity swelling Gastrointestinal:  Denies nausea, heartburn or change in bowel habits Skin: Denies abnormal skin rashes Lymphatics: Denies new lymphadenopathy or easy bruising Neurological:Denies numbness, tingling or new weaknesses Behavioral/Psych: Mood is stable, no new changes  All other systems were reviewed with the patient and are negative.  PHYSICAL EXAMINATION: ECOG PERFORMANCE STATUS: 2 - Symptomatic, <50%  confined to bed  Filed Vitals:   03/10/14 1350  BP: 125/72  Pulse: 86  Temp: 97.5 F (36.4 C)  Resp: 18   Filed Weights   03/10/14 1350  Weight: 262 lb (118.842 kg)    GENERAL:alert, no distress and comfortable SKIN: skin color, texture, turgor are normal, no rashes or significant lesions EYES: normal, Conjunctiva are pink and non-injected, sclera clear OROPHARYNX:no exudate, no erythema and lips, buccal mucosa, and tongue normal  NECK: supple, thyroid normal size, non-tender, without nodularity LYMPH:  no palpable lymphadenopathy in the cervical, axillary or inguinal LUNGS: clear to auscultation and percussion with normal   breathing effort HEART: regular rate & rhythm and no murmurs and no lower extremity edema ABDOMEN:abdomen soft, non-tender and normal bowel sounds Musculoskeletal:no cyanosis of digits and no clubbing  NEURO: alert & oriented x 3 with fluent speech, no focal motor/sensory deficits Breasts: right breast normal without mass, skin or nipple changes or axillary nodes lumpectomy scar healed, left breast normal without mass, skin or nipple changes or axillary nodes.   LABORATORY DATA:  I have reviewed the data as listed    Component Value Date/Time   NA 141 03/10/2014 1329   NA 141 01/30/2014 0523   K 4.7 03/10/2014 1329   K 4.3 01/30/2014 0523   CL 96 01/30/2014 0523   CL 98 03/08/2013 0959   CO2 29 03/10/2014 1329   CO2 31 01/30/2014 0523   GLUCOSE 183* 03/10/2014 1329   GLUCOSE 102* 01/30/2014 0523   GLUCOSE 202* 03/08/2013 0959   BUN 29.9* 03/10/2014 1329   BUN 42* 01/30/2014 0523   CREATININE 0.9 03/10/2014 1329   CREATININE 0.91 01/30/2014 0523   CREATININE 0.84 08/16/2013 1424   CALCIUM 9.8 03/10/2014 1329   CALCIUM 9.9 01/30/2014 0523   PROT 7.0 03/10/2014 1329   PROT 7.1 10/31/2013 2210   ALBUMIN 4.0 03/10/2014 1329   ALBUMIN 3.9 10/31/2013 2210   AST 15 03/10/2014 1329   AST 18 10/31/2013 2210   ALT 11 03/10/2014 1329   ALT 10 10/31/2013 2210   ALKPHOS 136  03/10/2014 1329   ALKPHOS 91 10/31/2013 2210   BILITOT 0.65 03/10/2014 1329   BILITOT 0.6 10/31/2013 2210   GFRNONAA 61* 01/30/2014 0523   GFRAA 70* 01/30/2014 0523    No results found for this basename: SPEP, UPEP,  kappa and lambda light chains    Lab Results  Component Value Date   WBC 7.3 03/10/2014   NEUTROABS 4.4 03/10/2014   HGB 14.5 03/10/2014   HCT 44.3 03/10/2014   MCV 87.5 03/10/2014   PLT 186 03/10/2014      Chemistry      Component Value Date/Time   NA 141 03/10/2014 1329   NA 141 01/30/2014 0523   K 4.7 03/10/2014 1329   K 4.3 01/30/2014 0523   CL 96 01/30/2014 0523   CL 98 03/08/2013 0959   CO2 29 03/10/2014 1329   CO2 31 01/30/2014 0523   BUN 29.9* 03/10/2014 1329   BUN 42* 01/30/2014 0523   CREATININE 0.9 03/10/2014 1329   CREATININE 0.91 01/30/2014 0523   CREATININE 0.84 08/16/2013 1424      Component Value Date/Time   CALCIUM 9.8 03/10/2014 1329   CALCIUM 9.9 01/30/2014 0523   ALKPHOS 136 03/10/2014 1329   ALKPHOS 91 10/31/2013 2210   AST 15 03/10/2014 1329   AST 18 10/31/2013 2210   ALT 11 03/10/2014 1329   ALT 10 10/31/2013 2210   BILITOT 0.65 03/10/2014 1329   BILITOT 0.6 10/31/2013 2210       RADIOGRAPHIC STUDIES: I have personally reviewed the radiological images as listed and agreed with the findings in the report. No results found.    ASSESSMENT & PLAN:  73 year old female with  #1 stage I (T1 N0) invasive ductal carcinoma of the right breast status post lumpectomy followed by radiation. She was then begun on Aromasin 25 mg daily. Overall she's tolerating it well. Minimal side effects. She has no evidence of recurrent disease.  #2 osteoporosis: Patient's bone density did reveal osteoporosis. I have recommended Fosamax. Side effects risks benefits and complications were discussed  with the patient. She will take Fosamax 70 mg on a weekly basis. She will need another bone density scan in 2 years. I have asked her to call me she starts having any reflux problems  whatsoever.  #3 anemia: Patient will continue on tandem one daily. Her H&H looks good.  #4 cardiac issues: Per cardiology.  #5 followup: Patient will be seen back in 6 months time or sooner if need arises  Orders Placed This Encounter  Procedures  . CBC with Differential    Standing Status: Future     Number of Occurrences:      Standing Expiration Date: 03/10/2015  . Comprehensive metabolic panel (Cmet) - [facility]    Standing Status: Future     Number of Occurrences:      Standing Expiration Date: 03/10/2015   All questions were answered. The patient knows to call the clinic with any problems, questions or concerns. No barriers to learning was detected. I spent 15 minutes counseling the patient face to face. The total time spent in the appointment was 25 minutes and more than 50% was on counseling and review of test results and coordination of care     Sharon Panning, MD 03/11/2014 2:43 PM

## 2014-03-12 LAB — BRAIN NATRIURETIC PEPTIDE: Brain Natriuretic Peptide: 109.6 pg/mL — ABNORMAL HIGH (ref 0.0–100.0)

## 2014-03-13 ENCOUNTER — Encounter: Payer: Self-pay | Admitting: Cardiology

## 2014-03-13 NOTE — Progress Notes (Signed)
Patient ID: [name], female   DOB: 1941/01/07, 73 y.o.   MRN: [id number]    03/13/2014 [name]   Jul 27, 1941  [id number]  Primary Physicia Sharon Frees, MD Primary Cardiologist: Dr. Debara Pickett  Ms. [name] returns to clinic with complaints of weight gain and DOE, in the setting of chronic diastolic CHF.   HPI: The patient is a 73 y/o female, followed by Dr [name], with chronic AF on Eliquis, COPD, chronic diastolic CHF (EF 80-99% 83/38), breast cancer, and morbid obesity with hypoventilation syndrome. She lives alone but she has a son that lives nearby. Her mobility is limited, she uses a walker. She was recently admitted to [facility] on 01/24/14 with acute on chronic diastolic CHF. She was treated with IV diuretics. She diuresed 7 lbs with improvement in her symptoms. She was discharged on 01/30/14. I evaluated her at the time of her post hospital visit on 02/14/14. At that time, she was doing well w/o any signs/ symptoms of heart failure. She was euvolemic on physical exam and her weight in the office that day was 253 lbs.  She was instructed to continue with daily weights and was given sliding scale lasix instructions for dosing adjustments   She returns to clinic today with complaints of weight gain and DOE, despite reported daily medication compliance and adherence to a low sodium diet. She denies resting dyspnea. No chest pain. She notes mild LEE. She has been on 80 mg of Lasix BID. She called the office several days ago to report her weight gain and was instructed to increase her lasix to 160 mg in the am and 80 mg at night. She reports doing so w/o any improvement.   Her weight today in the office is up 11 lbs since last OV 3 weeks ago at 264 lb.     Current Outpatient Prescriptions  Medication Sig Dispense Refill  . acetaminophen (TYLENOL) 500 MG tablet Take 1,000 mg by mouth every 6 (six) hours as needed. pain      . albuterol (PROVENTIL HFA) 108 (90 BASE) MCG/ACT inhaler Inhale 2 puffs  into the lungs every 6 (six) hours as needed for wheezing.      Marland Kitchen alendronate (FOSAMAX) 70 MG tablet Take 1 tablet (70 mg total) by mouth once a week. Take with a full glass of water on an empty stomach.  12 tablet  6  . allopurinol (ZYLOPRIM) 100 MG tablet Take 200 mg by mouth daily.       Marland Kitchen apixaban (ELIQUIS) 5 MG TABS tablet Take 1 tablet (5 mg total) by mouth 2 (two) times daily.  60 tablet  6  . Ascorbic Acid (VITAMIN C) 1000 MG tablet Take 1,000 mg by mouth daily.      Marland Kitchen CALCIUM PO Take by mouth 2 (two) times daily.      . Cholecalciferol (VITAMIN D-3 PO) Take by mouth daily.      . colchicine 0.6 MG tablet Take 1 tablet (0.6 mg total) by mouth 2 (two) times daily.  60 tablet  0  . diltiazem (CARDIZEM CD) 240 MG 24 hr capsule Take 1 capsule by mouth daily.      [name] Calcium (STOOL SOFTENER PO) Take by mouth daily.      Marland Kitchen exemestane (AROMASIN) 25 MG tablet Take 1 tablet (25 mg total) by mouth daily after breakfast.  30 tablet  12  . ferrous fumarate (HEMOCYTE - 106 MG FE) 325 (106 FE) MG TABS Take 1 tablet by  mouth 2 (two) times daily.       . Fluticasone-Salmeterol (ADVAIR) 250-50 MCG/DOSE AEPB Inhale 1 puff into the lungs 2 (two) times daily.      . furosemide (LASIX) 80 MG tablet Take 80-160 mg by mouth 2 (two) times daily. 160mg  AM 80mg  PM      . guaiFENesin (MUCINEX) 600 MG 12 hr tablet Take 1,200 mg by mouth 2 (two) times daily.      Marland Kitchen HUMULIN N 100 UNIT/ML injection Inject 5 Units into the skin as needed (5 units if blood sugar is greater than 150).       . insulin glargine (LANTUS) 100 UNIT/ML injection Inject 0.28 mLs (28 Units total) into the skin at bedtime.  10 mL  12  . ipratropium (ATROVENT) 0.02 % nebulizer solution Take 2.5 mLs (0.5 mg total) by nebulization every 6 (six) hours.  75 mL  0  . LORazepam (ATIVAN) 0.5 MG tablet Take one tablet by mouth every night at bedtime as needed for anxiety  30 tablet  0  . losartan (COZAAR) 50 MG tablet Take 1 tablet by mouth daily.       . meclizine (ANTIVERT) 25 MG tablet Take 6.25-12.5 mg by mouth 3 (three) times daily as needed for dizziness.       . metFORMIN (GLUCOPHAGE) 500 MG tablet Take 500 mg by mouth 2 (two) times daily with a meal. If cbg is greater than 120 pt takes medication      . Multiple Vitamin (MULTIVITAMIN WITH MINERALS) TABS Take 1 tablet by mouth daily.      . polyethylene glycol (MIRALAX / GLYCOLAX) packet Take 17 g by mouth daily.  14 each  0  . potassium chloride (KLOR-CON) 20 MEQ packet Take 20 mEq by mouth daily.      Marland Kitchen spironolactone (ALDACTONE) 12.5 mg TABS tablet Take 0.5 tablets (12.5 mg total) by mouth daily.  45 tablet  3  . metolazone (ZAROXOLYN) 5 MG tablet Take 1 tablet (5 mg total) by mouth every other day. Take 30 minutes prior to AM dose of lasix  45 tablet  3   No current facility-administered medications for this visit.    Allergies  Allergen Reactions  . Aspirin     Avoids due to being on blood thinners  . Contrast Media [Iodinated Diagnostic Agents] Nausea And Vomiting  . Ibuprofen     Avoids due to being on blood thinners  . Pravachol Other (See Comments)    Muscle pain  . Pravastatin Sodium     History   Social History  . Marital Status: Divorced    Spouse Name: N/A    Number of Children: 2  . Years of Education: N/A   Occupational History  . RETIRED     [employer]   Social History Main Topics  . Smoking status: Never Smoker   . Smokeless tobacco: Never Used  . Alcohol Use: No  . Drug Use: No  . Sexual Activity: No   Other Topics Concern  . Not on file   Social History Narrative   Widowed.  Lives alone.  Ambulates with a walker.     Review of Systems: General: negative for chills, fever, night sweats or weight changes.  Cardiovascular: negative for chest pain, dyspnea on exertion, edema, orthopnea, palpitations, paroxysmal nocturnal dyspnea or shortness of breath Dermatological: negative for rash Respiratory: negative for cough or wheezing Urologic:  negative for hematuria Abdominal: negative for nausea, vomiting, diarrhea, bright red blood per rectum,  melena, or hematemesis Neurologic: negative for visual changes, syncope, or dizziness All other systems reviewed and are otherwise negative except as noted above.    Blood pressure 118/92, pulse 84, height 4\' 11"  (1.499 m), weight 264 lb 11.2 oz (120.067 kg).  General appearance: alert, cooperative and no distress Neck: no carotid bruit and mild JVD Lungs: clear to auscultation bilaterally Heart: irregularly irregular rhythm Extremities: trace - 1+ bilateral LEE Pulses: 2+ and symmetric Skin: warm and dry Neurologic: Grossly normal   ASSESSMENT AND PLAN:   Acute on chronic diastolic heart failure Pt's weight is up 11 lb since last OV 3 weeks ago. She also endorses DOE, however her lungs sound fairly clear w/o rales. She has mild JVD and trace - 1+ LEE. She has increased her diuretic as instructed w/o any significant change. I have discussed case with Dr. Gwenlyn Found. Since she has normal renal function (last Scr of 0.91), we have elected to continue her on her current dose of 160 mg of lasix in the am and 80 mg at night. We will add 5 mg of Metolazone, every other day, as an adjunct to help facilitate better diuresis. We will order a BNP today, as well as a BMP to recheck renal function and electrolytes. Continue daily potassium. She will need to f/u in 3 days.     PLAN  Continue current lasix dose + 5 mg of Metolazone, every other day. Will check BNP and BMP today. Continue daily potassium. F/u with Cecilie Kicks, NP, on 03/14/14 for reassessment.   SIMMONS, BRITTAINYPA-C 03/13/2014 3:41 PM

## 2014-03-13 NOTE — Assessment & Plan Note (Signed)
Pt's weight is up 11 lb since last OV 3 weeks ago. She also endorses DOE, however her lungs sound fairly clear w/o rales. She has mild JVD and trace - 1+ LEE. She has increased her diuretic as instructed w/o any significant change. I have discussed case with Dr. Gwenlyn Found. Since she has normal renal function (last Scr of 0.91), we have elected to continue her on her current dose of 160 mg of lasix in the am and 80 mg at night. We will add 5 mg of Metolazone, every other day, as an adjunct to help facilitate better diuresis. We will order a BNP today, as well as a BMP to recheck renal function and electrolytes. Continue daily potassium. She will need to f/u in 3 days.

## 2014-03-14 ENCOUNTER — Ambulatory Visit (INDEPENDENT_AMBULATORY_CARE_PROVIDER_SITE_OTHER): Payer: Medicare Other | Admitting: Cardiology

## 2014-03-14 VITALS — BP 100/58 | HR 101 | Ht 59.0 in | Wt 262.0 lb

## 2014-03-14 DIAGNOSIS — I509 Heart failure, unspecified: Secondary | ICD-10-CM

## 2014-03-14 DIAGNOSIS — I5032 Chronic diastolic (congestive) heart failure: Secondary | ICD-10-CM

## 2014-03-14 DIAGNOSIS — I4819 Other persistent atrial fibrillation: Secondary | ICD-10-CM

## 2014-03-14 DIAGNOSIS — I5033 Acute on chronic diastolic (congestive) heart failure: Secondary | ICD-10-CM

## 2014-03-14 DIAGNOSIS — I4891 Unspecified atrial fibrillation: Secondary | ICD-10-CM

## 2014-03-14 MED ORDER — METOLAZONE 5 MG PO TABS: 5.0000 mg | ORAL_TABLET | ORAL | Status: DC

## 2014-03-14 NOTE — Patient Instructions (Signed)
Decrease metolazone to once a week on wed.  Only  Follow up with Dr. Debara Pickett as instructed.  Use inhaler.

## 2014-03-14 NOTE — Progress Notes (Signed)
03/18/2014   PCP: Shirline Frees, MD   Chief Complaint  Patient presents with  . Follow-up    Primary Cardiologist: Dr. Debara Pickett  HPI: 73 y/o female, followed by Dr Debara Pickett, with chronic AF on Eliquis, COPD, chronic diastolic CHF (EF 62-95% 28/41), breast cancer, and morbid obesity with hypoventilation syndrome. She lives alone but she has a son that lives nearby. Her mobility is limited, she uses a walker. She was recently admitted to [facility] on 01/24/14 with acute on chronic diastolic CHF. She was treated with IV diuretics. She diuresed 7 lbs with improvement in her symptoms. She was discharged on 01/30/14. She was evaluated at the time of her post hospital visit on 02/14/14. At that time, she was doing well w/o any signs/ symptoms of heart failure. She was euvolemic on physical exam and her weight in the office that day was 253 lbs. She was instructed to continue with daily weights and was given sliding scale lasix instructions for dosing adjustments .   She returned to clinic 03/11/14 with complaints of weight gain and DOE, despite reported daily medication compliance and adherence to a low sodium diet. She denied resting dyspnea. No chest pain. She notes mild LEE. She has been on 80 mg of Lasix BID. She called the office several days ago to report her weight gain and was instructed to increase her lasix to 160 mg in the am and 80 mg at night.  Still no improvement.  She was seen and metolazone 5 mg every other day was started, today she is back for follow up.    Her weight is down 2 pounds, BP is also down.  Cr. At time of metolazone addition was 0.68 BUN 25.  Her symptoms have improved.  No chest pain.     Allergies  Allergen Reactions  . Aspirin     Avoids due to being on blood thinners  . Contrast Media [Iodinated Diagnostic Agents] Nausea And Vomiting  . Ibuprofen     Avoids due to being on blood thinners  . Pravachol Other (See Comments)    Muscle pain  . Pravastatin  Sodium     Current Outpatient Prescriptions  Medication Sig Dispense Refill  . acetaminophen (TYLENOL) 500 MG tablet Take 1,000 mg by mouth every 6 (six) hours as needed. pain      . albuterol (PROVENTIL HFA) 108 (90 BASE) MCG/ACT inhaler Inhale 2 puffs into the lungs every 6 (six) hours as needed for wheezing.      Marland Kitchen alendronate (FOSAMAX) 70 MG tablet Take 1 tablet (70 mg total) by mouth once a week. Take with a full glass of water on an empty stomach.  12 tablet  6  . allopurinol (ZYLOPRIM) 100 MG tablet Take 200 mg by mouth daily.       Marland Kitchen apixaban (ELIQUIS) 5 MG TABS tablet Take 1 tablet (5 mg total) by mouth 2 (two) times daily.  60 tablet  6  . CALCIUM PO Take by mouth 2 (two) times daily.      . Cholecalciferol (VITAMIN D-3 PO) Take by mouth daily.      . colchicine 0.6 MG tablet Take 1 tablet (0.6 mg total) by mouth 2 (two) times daily.  60 tablet  0  . diltiazem (CARDIZEM CD) 240 MG 24 hr capsule Take 1 capsule by mouth daily.      Mariane Baumgarten Calcium (STOOL SOFTENER PO) Take by mouth daily.      Marland Kitchen  exemestane (AROMASIN) 25 MG tablet Take 1 tablet (25 mg total) by mouth daily after breakfast.  30 tablet  12  . ferrous fumarate (HEMOCYTE - 106 MG FE) 325 (106 FE) MG TABS Take 1 tablet by mouth 2 (two) times daily.       . Fluticasone-Salmeterol (ADVAIR) 250-50 MCG/DOSE AEPB Inhale 1 puff into the lungs 2 (two) times daily.      . furosemide (LASIX) 80 MG tablet Take 80-160 mg by mouth 2 (two) times daily. 160mg  AM 80mg  PM      . guaiFENesin (MUCINEX) 600 MG 12 hr tablet Take 1,200 mg by mouth 2 (two) times daily.      Marland Kitchen HUMULIN N 100 UNIT/ML injection Inject 5 Units into the skin as needed (5 units if blood sugar is greater than 150).       . insulin glargine (LANTUS) 100 UNIT/ML injection Inject 0.28 mLs (28 Units total) into the skin at bedtime.  10 mL  12  . ipratropium (ATROVENT) 0.02 % nebulizer solution Take 2.5 mLs (0.5 mg total) by nebulization every 6 (six) hours.  75 mL  0  .  LORazepam (ATIVAN) 0.5 MG tablet Take one tablet by mouth every night at bedtime as needed for anxiety  30 tablet  0  . losartan (COZAAR) 50 MG tablet Take 1 tablet by mouth daily.      . meclizine (ANTIVERT) 25 MG tablet Take 6.25-12.5 mg by mouth 3 (three) times daily as needed for dizziness.       . metFORMIN (GLUCOPHAGE) 500 MG tablet Take 500 mg by mouth 2 (two) times daily with a meal. If cbg is greater than 120 pt takes medication      . metolazone (ZAROXOLYN) 5 MG tablet Take 1 tablet (5 mg total) by mouth once a week. Take 30 minutes prior to AM dose of lasix  45 tablet  3  . Multiple Vitamin (MULTIVITAMIN WITH MINERALS) TABS Take 1 tablet by mouth daily.      . polyethylene glycol (MIRALAX / GLYCOLAX) packet Take 17 g by mouth daily.  14 each  0  . potassium chloride (KLOR-CON) 20 MEQ packet Take 20 mEq by mouth daily.      Marland Kitchen spironolactone (ALDACTONE) 12.5 mg TABS tablet Take 0.5 tablets (12.5 mg total) by mouth daily.  45 tablet  3  . Ascorbic Acid (VITAMIN C) 1000 MG tablet Take 1,000 mg by mouth daily.       No current facility-administered medications for this visit.    Past Medical History  Diagnosis Date  . Asthma   . Bronchitis   . Hernia   . Anemia   . Sarcoidosis   . Morbid obesity   . Sarcoid   . Dyslipidemia   . Cough   . Wheezing   . Chills   . Constipation   . Bruises easily   . Gout attack     ankle, then wrist and hands  . Hypertension   . Atrial fibrillation, persistent 11/01/2013  . Chronic anticoagulation, on Eliquis 11/01/2013  . History of nuclear stress test 02/09/2010    dipyridamole; normal pattern of perfusion in all regions with attenuation artifact in inferior region, low risk   . Fracture of left lower leg 06/1972    "no surgery; just casted it"  . CHF (congestive heart failure)   . Pneumonia 2012; 2014  . Chronic bronchitis   . OSA on CPAP   . Type II diabetes mellitus   .  Uterine cancer     s/p hysterectomy  . Breast cancer, stage 1  10/17/2011    s/p right lumpectomy and XRT  . On home oxygen therapy     "2L prn" (01/24/2014)    Past Surgical History  Procedure Laterality Date  . Total knee arthroplasty Bilateral 03/2010; 06/2011    left; right  . Abdominal hysterectomy  02/04/2005  . Umbilical hernia repair  02/04/2005  . Transthoracic echocardiogram  07/2008    EF, LV size is normal; RVSP normal; mod calcif of MV apparatus  . Breast biopsy Right 01/2011  . Breast lumpectomy Right 01/2011  . Tubal ligation  1970's  . Hernia repair      TML:[id number]:SF colds or fevers, no weight changes Skin:no rashes or ulcers HEENT:no blurred vision, no congestion CV:see HPI PUL:see HPI GI:no diarrhea constipation or melena, no indigestion GU:no hematuria, no dysuria MS:no joint pain, no claudication Neuro:no syncope, no lightheadedness Endo:no diabetes, no thyroid disease  PHYSICAL EXAM BP 100/58  Pulse 101  Ht 4\' 11"  (1.499 m)  Wt 262 lb (118.842 kg)  BMI 52.89 kg/m2 General:Pleasant affect, NAD Skin:Warm and dry, brisk capillary refill HEENT:normocephalic, sclera clear, mucus membranes moist Neck:supple, no JVD, no bruits  Heart:S1S2 RRR without murmur, gallup, rub or click Lungs:clear without rales, rhonchi, + wheezes KCL:EXNT, non tender, + BS, do not palpate liver spleen or masses Ext:no lower ext edema, 2+ pedal pulses, 2+ radial pulses Neuro:alert and oriented, MAE, follows commands, + facial symmetry EKG: a fib with RVR no acute changes  ASSESSMENT AND PLAN Acute on chronic diastolic heart failure Symptoms improved, 2 pound wt loss.  Decreased metolazone to Once weekly.  Labs stable. Her BNP was normal at 109.6  Atrial fibrillation, persistent Mild RVR in her atrial fib.  Chronic diastolic CHF (congestive heart failure) See previous note.  She does have wheezes, I have asked her to use her inhaler with her COPD.

## 2014-03-17 ENCOUNTER — Telehealth: Payer: Self-pay | Admitting: Physician Assistant

## 2014-03-17 NOTE — Telephone Encounter (Signed)
    [facility] called about a 6lb weight gain in 1 week for Ms. [name]. The patient denies any symptoms and is feeling fine. She was recently seen in the office by Rise Patience PA-C where she was put on 5 mg of Metolazone, every other day, as an adjunct to her Lasix to help facilitate better diuresis. The home health nurse will go to visit her in the morning and the patient instructed to call Dr. Lysbeth Penner office to report the weight gain.   Perry Mount PA-C  [facility]

## 2014-03-18 ENCOUNTER — Encounter: Payer: Self-pay | Admitting: Cardiology

## 2014-03-18 ENCOUNTER — Telehealth: Payer: Self-pay | Admitting: *Deleted

## 2014-03-18 NOTE — Assessment & Plan Note (Signed)
See previous note

## 2014-03-18 NOTE — Assessment & Plan Note (Addendum)
Symptoms improved, 2 pound wt loss.  Decreased metolazone to Once weekly.  Labs stable.

## 2014-03-18 NOTE — Telephone Encounter (Signed)
Faxed signed HH orders  

## 2014-03-18 NOTE — Assessment & Plan Note (Signed)
Mild RVR in her atrial fib.

## 2014-03-19 ENCOUNTER — Telehealth: Payer: Self-pay | Admitting: *Deleted

## 2014-03-19 NOTE — Telephone Encounter (Signed)
[name] was calling in regards to weight gain on this pt. She has gained 10 pounds and now symptomatic.   [place]

## 2014-03-19 NOTE — Telephone Encounter (Signed)
RN received call from [facility] with [place]. Gerald Stabs reports patient's dry weight after hospital discharge was 250 and is up to 261 today (of note, last OV weight was 262). Gerald Stabs reports patient is wheezing, SOB at rest, rales, dry hacking cough, distended/firm belly.   RN called patient, who c/o of above symptoms, but states she feels OK. Advised patient that given these symptoms, and decrease in diurectic at last OV on 4/3, she may should come in for eval with Mickel Baas, NP this afternoon. Patient is agreeable and will return call to office to set up appmt this afternoon once she gets transportation.   Will forward to Dr. Marguerita Beards, NP, scheduler

## 2014-03-19 NOTE — Telephone Encounter (Signed)
Called patient with instructions per Mickel Baas, NP. Patient voiced understanding and will call should symptoms worsen.

## 2014-03-19 NOTE — Telephone Encounter (Signed)
HER WT WAS BETTER THAN 264 ON PREVIOUS ov.  PT SHOULD USE INHALER, THIS MAY HAVE MORE TO DO WITH HER copd.   Have her take Metolazone every day prior to lasix for 3 days, then go to Metolazone M-W-F,  Make sure she is not eating salt, salty foods.  If no better with this treatment  Have her come in this week when Dr. Debara Pickett is in the office.  To se me but when he is available.

## 2014-03-20 ENCOUNTER — Ambulatory Visit (INDEPENDENT_AMBULATORY_CARE_PROVIDER_SITE_OTHER): Payer: Medicare Other | Admitting: Cardiology

## 2014-03-20 ENCOUNTER — Telehealth: Payer: Self-pay | Admitting: *Deleted

## 2014-03-20 VITALS — BP 122/74 | HR 75 | Ht 59.0 in | Wt 270.0 lb

## 2014-03-20 DIAGNOSIS — J449 Chronic obstructive pulmonary disease, unspecified: Secondary | ICD-10-CM

## 2014-03-20 DIAGNOSIS — I509 Heart failure, unspecified: Secondary | ICD-10-CM

## 2014-03-20 DIAGNOSIS — I4819 Other persistent atrial fibrillation: Secondary | ICD-10-CM

## 2014-03-20 DIAGNOSIS — I5033 Acute on chronic diastolic (congestive) heart failure: Secondary | ICD-10-CM

## 2014-03-20 DIAGNOSIS — I4891 Unspecified atrial fibrillation: Secondary | ICD-10-CM

## 2014-03-20 MED ORDER — SPIRONOLACTONE 12.5 MG HALF TABLET: 12.5000 mg | ORAL_TABLET | Freq: Two times a day (BID) | ORAL | Status: DC

## 2014-03-20 NOTE — Progress Notes (Signed)
03/23/2014   PCP: Sharon Frees, MD   Chief Complaint  Patient presents with  . Shortness of Breath    Primary Cardiologist: Dr. Debara Larson  HPI:  73 y/o female, followed by Dr [name], with chronic AF on Eliquis, COPD, chronic diastolic CHF (EF 21-30% 86/57), breast cancer, and morbid obesity with hypoventilation syndrome. She lives alone but she has a son that lives nearby. Her mobility is limited, she uses a walker. She was recently admitted to [facility] on 01/24/14 with acute on chronic diastolic CHF. She was treated with IV diuretics. She diuresed 7 lbs with improvement in her symptoms. She was discharged on 01/30/14. She was evaluated at the time of her post hospital visit on 02/14/14. At that time, she was doing well w/o any signs/ symptoms of heart failure. She was euvolemic on physical exam and her weight in the office that day was 253 lbs. She was instructed to continue with daily weights and was given sliding scale lasix instructions for dosing adjustments .   She returned to clinic 03/11/14 with complaints of weight gain and DOE, despite reported daily medication compliance and adherence to a low sodium diet. She denied resting dyspnea. No chest pain. She notes mild LEE. She has been on 80 mg of Lasix BID. She called the office several days ago to report her weight gain and was instructed to increase her lasix to 160 mg in the am and 80 mg at night. Still no improvement. She was seen and metolazone 5 mg every other day was started, on follow up her weight was down 2 pounds, lungs were clear of rales- she did have wheezes, her permanent a fib was rate controlled. I decreased her metolazone to once weekly.  Her BNP was 109.6-- her Cr. Had increased slightly.  On recheck of Pro-BNP not bnp --it was 1011 up from 404 in Feb.   Within 4 days the pt's wt was up and she complained of SOB- we resumed her metolazone daily.  She comes in today with wt up 8 pounds.  No constipation, she feels if  abd larger.  Some SOB.  She has been using her inhaler.  No salty foods.   Allergies  Allergen Reactions  . Aspirin     Avoids due to being on blood thinners  . Contrast Media [Iodinated Diagnostic Agents] Nausea And Vomiting  . Ibuprofen     Avoids due to being on blood thinners  . Pravachol Other (See Comments)    Muscle pain  . Pravastatin Sodium     Current Outpatient Prescriptions  Medication Sig Dispense Refill  . acetaminophen (TYLENOL) 500 MG tablet Take 1,000 mg by mouth every 6 (six) hours as needed. pain      . albuterol (PROVENTIL HFA) 108 (90 BASE) MCG/ACT inhaler Inhale 2 puffs into the lungs every 6 (six) hours as needed for wheezing.      Marland Kitchen alendronate (FOSAMAX) 70 MG tablet Take 1 tablet (70 mg total) by mouth once a week. Take with a full glass of water on an empty stomach.  12 tablet  6  . allopurinol (ZYLOPRIM) 100 MG tablet Take 200 mg by mouth daily.       Marland Kitchen apixaban (ELIQUIS) 5 MG TABS tablet Take 1 tablet (5 mg total) by mouth 2 (two) times daily.  60 tablet  6  . Ascorbic Acid (VITAMIN C) 1000 MG tablet Take 1,000 mg by mouth daily.      Marland Kitchen  CALCIUM PO Take by mouth 2 (two) times daily.      . Cholecalciferol (VITAMIN D-3 PO) Take by mouth daily.      . colchicine 0.6 MG tablet Take 1 tablet (0.6 mg total) by mouth 2 (two) times daily.  60 tablet  0  . diltiazem (CARDIZEM CD) 240 MG 24 hr capsule Take 1 capsule by mouth daily.      [name] Calcium (STOOL SOFTENER PO) Take by mouth daily.      . ferrous fumarate (HEMOCYTE - 106 MG FE) 325 (106 FE) MG TABS Take 1 tablet by mouth 2 (two) times daily.       . Fluticasone-Salmeterol (ADVAIR) 250-50 MCG/DOSE AEPB Inhale 1 puff into the lungs 2 (two) times daily.      . furosemide (LASIX) 80 MG tablet Take 80-160 mg by mouth 2 (two) times daily. 160mg  AM 80mg  PM      . guaiFENesin (MUCINEX) 600 MG 12 hr tablet Take 1,200 mg by mouth 2 (two) times daily.      Marland Kitchen HUMULIN N 100 UNIT/ML injection Inject 5 Units into the  skin as needed (5 units if blood sugar is greater than 150).       . insulin glargine (LANTUS) 100 UNIT/ML injection Inject 0.28 mLs (28 Units total) into the skin at bedtime.  10 mL  12  . ipratropium (ATROVENT) 0.02 % nebulizer solution Take 2.5 mLs (0.5 mg total) by nebulization every 6 (six) hours.  75 mL  0  . LORazepam (ATIVAN) 0.5 MG tablet Take one tablet by mouth every night at bedtime as needed for anxiety  30 tablet  0  . losartan (COZAAR) 50 MG tablet Take 1 tablet by mouth daily.      . meclizine (ANTIVERT) 25 MG tablet Take 6.25-12.5 mg by mouth 3 (three) times daily as needed for dizziness.       . metFORMIN (GLUCOPHAGE) 500 MG tablet Take 500 mg by mouth 2 (two) times daily with a meal. If cbg is greater than 120 pt takes medication      . metolazone (ZAROXOLYN) 5 MG tablet Take 5 mg by mouth daily. Take 30 minutes prior to AM dose of lasix      . Multiple Vitamin (MULTIVITAMIN WITH MINERALS) TABS Take 1 tablet by mouth daily.      . polyethylene glycol (MIRALAX / GLYCOLAX) packet Take 17 g by mouth daily.  14 each  0  . spironolactone (ALDACTONE) 12.5 mg TABS tablet Take 0.5 tablets (12.5 mg total) by mouth 2 (two) times daily.  45 tablet  3  . exemestane (AROMASIN) 25 MG tablet Take 1 tablet (25 mg total) by mouth daily after breakfast.  30 tablet  12   No current facility-administered medications for this visit.    Past Medical History  Diagnosis Date  . Asthma   . Bronchitis   . Hernia   . Anemia   . Sarcoidosis   . Morbid obesity   . Sarcoid   . Dyslipidemia   . Cough   . Wheezing   . Chills   . Constipation   . Bruises easily   . Gout attack     ankle, then wrist and hands  . Hypertension   . Atrial fibrillation, persistent 11/01/2013  . Chronic anticoagulation, on Eliquis 11/01/2013  . History of nuclear stress test 02/09/2010    dipyridamole; normal pattern of perfusion in all regions with attenuation artifact in inferior region, low risk   .  Fracture of  left lower leg 06/1972    "no surgery; just casted it"  . CHF (congestive heart failure)   . Pneumonia 2012; 2014  . Chronic bronchitis   . OSA on CPAP   . Type II diabetes mellitus   . Uterine cancer     s/p hysterectomy  . Breast cancer, stage 1 10/17/2011    s/p right lumpectomy and XRT  . On home oxygen therapy     "2L prn" (01/24/2014)    Past Surgical History  Procedure Laterality Date  . Total knee arthroplasty Bilateral 03/2010; 06/2011    left; right  . Abdominal hysterectomy  02/04/2005  . Umbilical hernia repair  02/04/2005  . Transthoracic echocardiogram  07/2008    EF, LV size is normal; RVSP normal; mod calcif of MV apparatus  . Breast biopsy Right 01/2011  . Breast lumpectomy Right 01/2011  . Tubal ligation  1970's  . Hernia repair      MBW:[id number]:TT colds or fevers,  weight up from 262 to 170 lbs Skin:no rashes or ulcers HEENT:no blurred vision, no congestion CV:see HPI PUL:see HPI GI:no diarrhea constipation or melena, no indigestion, recent BM GU:no hematuria, no dysuria MS:no joint pain, no claudication Neuro:no syncope, no lightheadedness Endo:+  Diabetes stable, no thyroid disease  PHYSICAL EXAM BP 122/74  Pulse 75  Ht 4\' 11"  (1.499 m)  Wt 270 lb (122.471 kg)  BMI 54.50 kg/m2 General:Pleasant affect, NAD Skin:Warm and dry, brisk capillary refill HEENT:normocephalic, sclera clear, mucus membranes moist Neck:supple, no JVD, no bruits  Heart:irreg irreg without murmur, gallup, rub or click Lungs:clear without rales, rhonchi, or wheezes SVX:BLTJQ, soft, non tender, + BS, do not palpate liver spleen or masses Ext:tr lower ext edema, support stockings in place,  2+ pedal pulses, 2+ radial pulses Neuro:alert and oriented, MAE, follows commands, + facial symmetry  EKG: atrial fib rate controlled, no acute changes  ASSESSMENT AND PLAN Acute on chronic diastolic heart failure Lungs clear, but per pt with increased abd girth, and wt increase of 8 pounds.   She is Lasix 60 mg in AM and on Lasix 80 mg in pm.  Discussed with Dr. Claiborne Billings.  --will resume metolazone daily, stop K+ and increase spironolactone to 12.5 BID.  .  If no improvement then change lasix to torsemide other option to admit for IV diuresis.  She will follow up on Monday.   Atrial fibrillation, persistent Rate controlled  COPD (chronic obstructive pulmonary disease) No rales today, no wheezes

## 2014-03-20 NOTE — Telephone Encounter (Signed)
Patient has appmt 4/9 at 3:20 with laura ingold, NP

## 2014-03-20 NOTE — Patient Instructions (Signed)
Stop potassium Increase spironolactone to 12.5 mg BID  Continue lasix 160 mg in AM and 80 mg PM  Continue metolazone 5 mg daily prior to lasix  Follow up Monday with APP or Dr. Debara Pickett.  If no better may need to be admitted to hospital

## 2014-03-20 NOTE — Telephone Encounter (Signed)
Ask her to come in and see Mickel Baas today if she can.  -Dr. Debara Pickett

## 2014-03-20 NOTE — Telephone Encounter (Signed)
Dr. Debara Pickett - please advise. See telephone note from 03/19/14.   Offered patient appmt yesterday with Mickel Baas, but then advised her on med change per Mickel Baas.. Got this call this AM

## 2014-03-20 NOTE — Telephone Encounter (Signed)
[name] has stated that [name] has gained another 2 lbs, which is 6lbs in 2 days.  [place]

## 2014-03-22 LAB — BASIC METABOLIC PANEL WITH GFR
BUN: 37 mg/dL — AB (ref 6–23)
CO2: 34 meq/L — AB (ref 19–32)
Calcium: 9.7 mg/dL (ref 8.4–10.5)
Chloride: 87 mEq/L — ABNORMAL LOW (ref 96–112)
Creat: 0.9 mg/dL (ref 0.50–1.10)
GFR, EST AFRICAN AMERICAN: 73 mL/min
GFR, Est Non African American: 63 mL/min
GLUCOSE: 226 mg/dL — AB (ref 70–99)
POTASSIUM: 3.4 meq/L — AB (ref 3.5–5.3)
Sodium: 136 mEq/L (ref 135–145)

## 2014-03-22 LAB — PRO B NATRIURETIC PEPTIDE: Pro B Natriuretic peptide (BNP): 1011 pg/mL — ABNORMAL HIGH (ref <126)

## 2014-03-23 ENCOUNTER — Encounter: Payer: Self-pay | Admitting: Cardiology

## 2014-03-23 NOTE — Assessment & Plan Note (Signed)
No rales today, no wheezes

## 2014-03-23 NOTE — Assessment & Plan Note (Signed)
Rate controlled 

## 2014-03-23 NOTE — Assessment & Plan Note (Addendum)
Lungs clear, but per pt with increased abd girth, and wt increase of 8 pounds.  She is Lasix 60 mg in AM and on Lasix 80 mg in pm.  Discussed with Dr. Claiborne Billings.  --will resume metolazone daily, stop K+ and increase spironolactone to 12.5 BID.  .  If no improvement then change lasix to torsemide other option to admit for IV diuresis.  She will follow up on Monday.

## 2014-03-24 ENCOUNTER — Ambulatory Visit (INDEPENDENT_AMBULATORY_CARE_PROVIDER_SITE_OTHER): Payer: Medicare Other | Admitting: Physician Assistant

## 2014-03-24 ENCOUNTER — Encounter: Payer: Self-pay | Admitting: Physician Assistant

## 2014-03-24 VITALS — BP 110/60 | HR 72 | Ht 59.0 in | Wt 268.0 lb

## 2014-03-24 DIAGNOSIS — I10 Essential (primary) hypertension: Secondary | ICD-10-CM

## 2014-03-24 DIAGNOSIS — I5033 Acute on chronic diastolic (congestive) heart failure: Secondary | ICD-10-CM

## 2014-03-24 DIAGNOSIS — G4733 Obstructive sleep apnea (adult) (pediatric): Secondary | ICD-10-CM

## 2014-03-24 DIAGNOSIS — Z79899 Other long term (current) drug therapy: Secondary | ICD-10-CM

## 2014-03-24 DIAGNOSIS — R0989 Other specified symptoms and signs involving the circulatory and respiratory systems: Secondary | ICD-10-CM

## 2014-03-24 DIAGNOSIS — R0609 Other forms of dyspnea: Secondary | ICD-10-CM

## 2014-03-24 DIAGNOSIS — R0600 Dyspnea, unspecified: Secondary | ICD-10-CM

## 2014-03-24 NOTE — Assessment & Plan Note (Signed)
Blood pressure is well-controlled at this time. 

## 2014-03-24 NOTE — Progress Notes (Signed)
Date:  03/24/2014   ID:  [name], DOB 01/30/1941, MRN [id number]  PCP:  Shirline Frees, MD  Primary Cardiologist:  HIlty     History of Present Illness: [name] is a 73 y.o. female 73 y/o female, followed by Dr Debara Pickett, with chronic AF on Eliquis, COPD, chronic diastolic CHF (EF 72-53% 66/44), breast cancer, and morbid obesity with hypoventilation syndrome. She lives alone but she has a son that lives nearby. Her mobility is limited, she uses a walker. She was recently admitted to [facility] on 01/24/14 with acute on chronic diastolic CHF. She was treated with IV diuretics. She diuresed 7 lbs with improvement in her symptoms. She was discharged on 01/30/14. She was evaluated at the time of her post hospital visit on 02/14/14. At that time, she was doing well w/o any signs/ symptoms of heart failure. She was euvolemic on physical exam and her weight in the office that day was 253 lbs. She was instructed to continue with daily weights and was given sliding scale lasix instructions for dosing adjustments .   She returned to clinic 03/11/14 with complaints of weight gain and DOE, despite reported daily medication compliance and adherence to a low sodium diet.  She has been on 80 mg of Lasix BID. She called the office several days ago to report her weight gain and was instructed to increase her lasix to 160 mg in the am and 80 mg at night. Still no improvement. She was seen and metolazone 5 mg every other day was started, on follow up her weight was down 2 pounds, lungs were clear of rales- she did have wheezes, her permanent a fib was rate controlled. I decreased her metolazone to once weekly. Her BNP was 109.6-- her Cr. Had increased slightly. On recheck of Pro-BNP not bnp --it was 1011 up from 109 on March 31st.   Patient presents today for followup evaluation she reports little improvement since her prior visit. She has lost a couple pounds on our scale. She is still short of breath.  She only  sleeps in a recliner and uses a CPAP. She reports sleeping quite well. BUN is elevated. She also reports some mild dizziness.  The patient currently denies nausea, vomiting, fever, chest pain, PND, cough, congestion, abdominal pain, hematochezia, melena, lower extremity edema, claudication.    Wt Readings from Last 3 Encounters:  03/24/14 268 lb (121.564 kg)  03/20/14 270 lb (122.471 kg)  03/14/14 262 lb (118.842 kg)     Past Medical History  Diagnosis Date  . Asthma   . Bronchitis   . Hernia   . Anemia   . Sarcoidosis   . Morbid obesity   . Sarcoid   . Dyslipidemia   . Cough   . Wheezing   . Chills   . Constipation   . Bruises easily   . Gout attack     ankle, then wrist and hands  . Hypertension   . Atrial fibrillation, persistent 11/01/2013  . Chronic anticoagulation, on Eliquis 11/01/2013  . History of nuclear stress test 02/09/2010    dipyridamole; normal pattern of perfusion in all regions with attenuation artifact in inferior region, low risk   . Fracture of left lower leg 06/1972    "no surgery; just casted it"  . CHF (congestive heart failure)   . Pneumonia 2012; 2014  . Chronic bronchitis   . OSA on CPAP   . Type II diabetes mellitus   . Uterine cancer  s/p hysterectomy  . Breast cancer, stage 1 10/17/2011    s/p right lumpectomy and XRT  . On home oxygen therapy     "2L prn" (01/24/2014)    Current Outpatient Prescriptions  Medication Sig Dispense Refill  . acetaminophen (TYLENOL) 500 MG tablet Take 1,000 mg by mouth every 6 (six) hours as needed. pain      . albuterol (PROVENTIL HFA) 108 (90 BASE) MCG/ACT inhaler Inhale 2 puffs into the lungs every 6 (six) hours as needed for wheezing.      Marland Kitchen alendronate (FOSAMAX) 70 MG tablet Take 1 tablet (70 mg total) by mouth once a week. Take with a full glass of water on an empty stomach.  12 tablet  6  . allopurinol (ZYLOPRIM) 100 MG tablet Take 200 mg by mouth daily.       Marland Kitchen apixaban (ELIQUIS) 5 MG TABS tablet  Take 1 tablet (5 mg total) by mouth 2 (two) times daily.  60 tablet  6  . Ascorbic Acid (VITAMIN C) 1000 MG tablet Take 1,000 mg by mouth daily.      Marland Kitchen CALCIUM PO Take by mouth 2 (two) times daily.      . Cholecalciferol (VITAMIN D-3 PO) Take by mouth daily.      . colchicine 0.6 MG tablet Take 1 tablet (0.6 mg total) by mouth 2 (two) times daily.  60 tablet  0  . diltiazem (CARDIZEM CD) 240 MG 24 hr capsule Take 1 capsule by mouth daily.      Mariane Baumgarten Calcium (STOOL SOFTENER PO) Take by mouth daily.      Marland Kitchen exemestane (AROMASIN) 25 MG tablet Take 1 tablet (25 mg total) by mouth daily after breakfast.  30 tablet  12  . ferrous fumarate (HEMOCYTE - 106 MG FE) 325 (106 FE) MG TABS Take 1 tablet by mouth 2 (two) times daily.       . Fluticasone-Salmeterol (ADVAIR) 250-50 MCG/DOSE AEPB Inhale 1 puff into the lungs 2 (two) times daily.      . furosemide (LASIX) 80 MG tablet Take 80-160 mg by mouth 2 (two) times daily. 160mg  AM 80mg  PM      . guaiFENesin (MUCINEX) 600 MG 12 hr tablet Take 1,200 mg by mouth 2 (two) times daily.      Marland Kitchen HUMULIN N 100 UNIT/ML injection Inject 5 Units into the skin as needed (5 units if blood sugar is greater than 150).       . insulin glargine (LANTUS) 100 UNIT/ML injection Inject 0.28 mLs (28 Units total) into the skin at bedtime.  10 mL  12  . ipratropium (ATROVENT) 0.02 % nebulizer solution Take 2.5 mLs (0.5 mg total) by nebulization every 6 (six) hours.  75 mL  0  . LORazepam (ATIVAN) 0.5 MG tablet Take one tablet by mouth every night at bedtime as needed for anxiety  30 tablet  0  . losartan (COZAAR) 50 MG tablet Take 1 tablet by mouth daily.      . meclizine (ANTIVERT) 25 MG tablet Take 6.25-12.5 mg by mouth 3 (three) times daily as needed for dizziness.       . metFORMIN (GLUCOPHAGE) 500 MG tablet Take 500 mg by mouth 2 (two) times daily with a meal. If cbg is greater than 120 pt takes medication      . metolazone (ZAROXOLYN) 5 MG tablet Take 5 mg by mouth daily.  Take 30 minutes prior to AM dose of lasix      .  Multiple Vitamin (MULTIVITAMIN WITH MINERALS) TABS Take 1 tablet by mouth daily.      . polyethylene glycol (MIRALAX / GLYCOLAX) packet Take 17 g by mouth daily.  14 each  0  . spironolactone (ALDACTONE) 12.5 mg TABS tablet Take 0.5 tablets (12.5 mg total) by mouth 2 (two) times daily.  45 tablet  3   No current facility-administered medications for this visit.    Allergies:    Allergies  Allergen Reactions  . Aspirin     Avoids due to being on blood thinners  . Contrast Media [Iodinated Diagnostic Agents] Nausea And Vomiting  . Ibuprofen     Avoids due to being on blood thinners  . Pravachol Other (See Comments)    Muscle pain  . Pravastatin Sodium     Social History:  The patient  reports that she has never smoked. She has never used smokeless tobacco. She reports that she does not drink alcohol or use illicit drugs.   Family history:   Family History  Problem Relation Age of Onset  . Diabetes Mother   . Colon cancer Father   . Diabetes Brother   . Lung cancer Brother   . Diabetes Sister   . Cervical cancer Sister     ROS:  Please see the history of present illness.  All other systems reviewed and negative.   PHYSICAL EXAM: VS:  BP 110/60  Pulse 72  Ht 4\' 11"  (1.499 m)  Wt 268 lb (121.564 kg)  BMI 54.10 kg/m2 Obese, well developed, in no acute distress HEENT: Pupils are equal round react to light accommodation extraocular movements are intact.  Neck: no apparent JVDNo cervical lymphadenopathy. Cardiac: Regular rate and rhythm without murmurs rubs or gallops. Lungs:  clear to auscultation bilaterally, no wheezing, rhonchi or rales Ext: no lower extremity edema.  2+ radial and dorsalis pedis pulses. Skin: warm and dry Neuro:  Grossly normal    ASSESSMENT AND PLAN:  Problem List Items Addressed This Visit   Obstructive sleep apnea (Chronic)     Currently using CPAP and sleeping well    Hypertension (Chronic)      Blood pressure is well-controlled at this time    Acute on chronic diastolic heart failure     Patient says a decrease in weight approximately 2 pounds. She still is significantly short of breath she does sleep in a recliner. She denies any paroxysmal nocturnal dyspnea. She does sleep with CPAP at night. It appears that her lower extremity edema has resolved.  Lungs sound fairly clear with no rales or wheezing however, she does appear to have some upper airway rattling.  BUN is elevated so we may be drying her out a little.  Check a BNP, basic metabolic panel, and chest x-ray. We'll review the labs tonight and depending on the BUN and creatinine last the patient to adjust her diuretic.  Some of this may be a COPD exacerbation. I have asked the patient to use her albuterol 4 times a day for the next couple days.     Other Visit Diagnoses   Dyspnea    -  Primary    Relevant Orders       DG Chest 2 View       B Nat Peptide       Basic Metabolic Panel (BMET)    Encounter for long-term (current) use of other medications        Relevant Orders       B Nat Peptide  Basic Metabolic Panel (BMET)

## 2014-03-24 NOTE — Patient Instructions (Addendum)
1.  Chest xray today 2.  Use inhaler four times daily for a couple days. 3.  Labs today. 4.  Follow up in a week. 5. Take one tablet of potassium when you get home.

## 2014-03-24 NOTE — Assessment & Plan Note (Addendum)
Currently using CPAP and sleeping well

## 2014-03-24 NOTE — Assessment & Plan Note (Signed)
Patient says a decrease in weight approximately 2 pounds. She still is significantly short of breath she does sleep in a recliner. She denies any paroxysmal nocturnal dyspnea. She does sleep with CPAP at night. It appears that her lower extremity edema has resolved.  Lungs sound fairly clear with no rales or wheezing however, she does appear to have some upper airway rattling.  BUN is elevated so we may be drying her out a little.  Check a BNP, basic metabolic panel, and chest x-ray. We'll review the labs tonight and depending on the BUN and creatinine last the patient to adjust her diuretic.  Some of this may be a COPD exacerbation. I have asked the patient to use her albuterol 4 times a day for the next couple days.

## 2014-03-25 ENCOUNTER — Ambulatory Visit
Admission: RE | Admit: 2014-03-25 | Discharge: 2014-03-25 | Disposition: A | Discharge status: Home / Self Care | Payer: Medicare Other | Source: Ambulatory Visit | Attending: Physician Assistant | Admitting: Physician Assistant

## 2014-03-25 ENCOUNTER — Telehealth: Payer: Self-pay | Admitting: Physician Assistant

## 2014-03-25 DIAGNOSIS — R0600 Dyspnea, unspecified: Secondary | ICD-10-CM

## 2014-03-25 LAB — BASIC METABOLIC PANEL
BUN: 39 mg/dL — ABNORMAL HIGH (ref 6–23)
CO2: 35 meq/L — AB (ref 19–32)
Calcium: 9.9 mg/dL (ref 8.4–10.5)
Chloride: 91 mEq/L — ABNORMAL LOW (ref 96–112)
Creat: 0.82 mg/dL (ref 0.50–1.10)
Glucose, Bld: 192 mg/dL — ABNORMAL HIGH (ref 70–99)
POTASSIUM: 3.8 meq/L (ref 3.5–5.3)
SODIUM: 136 meq/L (ref 135–145)

## 2014-03-25 LAB — BRAIN NATRIURETIC PEPTIDE: Brain Natriuretic Peptide: 63.8 pg/mL (ref 0.0–100.0)

## 2014-03-25 NOTE — Telephone Encounter (Signed)
Labs and CXR reviewed.  Improved.  I think this was mostly COPD exacerbation.  I spoke to the patient and decreased her lasix back to 80 bid and stopped the metolazone.  She has an appt with pulmonologist in June.  Tarri Fuller [facility]

## 2014-03-26 ENCOUNTER — Other Ambulatory Visit: Payer: Self-pay | Admitting: Oncology

## 2014-03-31 ENCOUNTER — Telehealth: Payer: Self-pay | Admitting: *Deleted

## 2014-03-31 ENCOUNTER — Ambulatory Visit: Payer: Medicare Other | Admitting: Cardiology

## 2014-04-01 ENCOUNTER — Telehealth: Payer: Self-pay | Admitting: Internal Medicine

## 2014-04-01 MED ORDER — METOLAZONE 5 MG PO TABS: 2.5000 mg | ORAL_TABLET | Freq: Every day | ORAL | Status: DC

## 2014-04-01 NOTE — Telephone Encounter (Signed)
BP 130/70 HR 104 afib Resp 24 O2 sat 97% RA.  No rales, but rhonchi and wheezes.  Pt SOB sitting.  Pt has more swelling than usual.  Pt taking Lasix 80 mg in AM and 40 mg in PM.  Gerald Stabs informed pt should be taking Lasix 80 mg BID and that MD will be notified for further instructions.    Dr. Percival Spanish notified and advised pt go back to 80 mg BID and take 2.5 mg metolazone x 1 dose to see if that helps.  Chris notified and verbalized understanding.  Will review with pt.

## 2014-04-01 NOTE — Telephone Encounter (Signed)
Gerald Stabs, nurse w/ [facility], stated monitor reported 3lb wt gain.  Stated pt reported swelling and SOB.  She is on her way to see pt now.  Pt's weight today is 266 lbs on home scale.  Gerald Stabs agreed to call back after she has assessed pt to determine if pt is in any distress.  Also will review meds w/ pt so adjustments can be made if needed.  PLEASE PAGE TRIAGE WHEN NURSE CALLS BACK.

## 2014-04-03 ENCOUNTER — Telehealth: Payer: Self-pay | Admitting: Internal Medicine

## 2014-04-03 ENCOUNTER — Other Ambulatory Visit: Payer: Self-pay | Admitting: Internal Medicine

## 2014-04-03 DIAGNOSIS — R0602 Shortness of breath: Secondary | ICD-10-CM

## 2014-04-03 DIAGNOSIS — Z79899 Other long term (current) drug therapy: Secondary | ICD-10-CM

## 2014-04-03 MED ORDER — LEVALBUTEROL TARTRATE 45 MCG/ACT IN AERO: 2.0000 | INHALATION_SPRAY | Freq: Four times a day (QID) | RESPIRATORY_TRACT | Status: DC | PRN

## 2014-04-03 MED ORDER — TORSEMIDE 20 MG PO TABS: 40.0000 mg | ORAL_TABLET | Freq: Two times a day (BID) | ORAL | Status: DC

## 2014-04-03 NOTE — Telephone Encounter (Signed)
Spoke w/ Gerald Stabs, [facility] nurse.  Stated pt gained 2 lbs since call a couple of days ago.  In afib w/ rapid ventricular response at 130-140 w/ HR in 80s   No CP, but SOB.  Stated pt feels fine and does not appear to be in distress.  HR now in 70s.  O2 sats 96% RA.  Stated pt has been taking Lasix 80 mg BID and took extra metolazone.  Wt now 269.8 lbs.  Cecilie Kicks, NP notified and advised: D/c Lasix, Start torsemide 40 mg BID, Hold metolazone and only take if wt gain of 3 lbs and take every other day before torsemide until wt back to baseline.  Also: BMP and BNP on 4.27.15 (have [place] draw) so results will be back by appt on 4.28.15 w/ Lyda Jester, PA-C.  Call back to [facility] and informed.  Verbalized understanding and stated pt is back in Afib with rapid ventricular response at 130-140.  Cecilie Kicks, NP notified and will discuss w/ DOD (Dr. Claiborne Billings) for further instructions.  Chris informed and advised RN call pt back w/ instructions.  Cecilie Kicks, NP advised pt d/c albuterol and start Xopenex.  Call to pt and informed.  Pt verbalized understanding and agreed w/ plan.  Pt repeated instructions and stated she wrote them down.  Stated she hasn't had to use albuterol and only uses Advair.  Pt informed she should use the new inhaler w/ wheezing or SOB as her symptoms may not be r/t wt gain and it should not speed up her HR.  Pt verbalized understanding and agreed w/ plan.   Call to [facility], [facility] nurse, and VO given to draw BMP and BNP on Monday so results will be back before pt's appt.  Agreed to have drawn.

## 2014-04-04 ENCOUNTER — Telehealth: Payer: Self-pay | Admitting: *Deleted

## 2014-04-04 ENCOUNTER — Ambulatory Visit: Payer: Medicare Other | Admitting: Cardiology

## 2014-04-04 NOTE — Telephone Encounter (Signed)
Rx was sent to pharmacy electronically. 

## 2014-04-04 NOTE — Telephone Encounter (Signed)
kristen please advise.

## 2014-04-04 NOTE — Telephone Encounter (Signed)
Gerald Stabs was calling in regards to getting orders for [name] to have her coumadin checked. She stated that she had orders for Monday but now she has nothing for next week and after.  [place]

## 2014-04-04 NOTE — Telephone Encounter (Signed)
Our records show pt on Eliquis, no INR needed.  LMOM for RN, if pt truly taking warfarin please draw INR Monday and call us.

## 2014-04-04 NOTE — Telephone Encounter (Signed)
Dr. Ellyn Hack notified and advised ER if pt in Afib and symptomatic or take metolazone 30 mins before torsemide now and scheduled evening dose until wt down to baseline.  Returned call and pt verified.  Pt informed message received.  Pt stated she really wants to be seen today if she can.  Stated she still has fluid.  Pt denied getting torsemide.  Stated it will be delivered today at 1pm.  Pt unsure of wt b/c she has not been taking it on a regular schedule.  Pt stated she feels better today w/ her breathing.  HR 89-96  Advice  Daily weights at same time every day w/ same amt of clothing  Take metolazone 30 mins before torsemide (today ~ 12:30-12:45 pm)  Check HR periodically and if HR 130s or higher and SOB or other symptoms, go to ER  After hours/weekend call instructions given  Call office today before 4 pm if any questions  Take PM dose of torsemide as scheduled  STOP metolazone when wt down to 263 lbs  Keep appt on Tuesday  Pt verbalized understanding and agreed w/ plan.  Pt repeated instructions.  Message forwarded to Dr. Debara Pickett [facility]).

## 2014-04-04 NOTE — Telephone Encounter (Signed)
F/u call to pt and medicine was being delivered while on phone.  Pt understands plan.

## 2014-04-04 NOTE — Telephone Encounter (Signed)
Pt has fluid on her and wants to be seen today.  [place]

## 2014-04-08 ENCOUNTER — Encounter: Payer: Self-pay | Admitting: Cardiology

## 2014-04-08 ENCOUNTER — Ambulatory Visit (INDEPENDENT_AMBULATORY_CARE_PROVIDER_SITE_OTHER): Payer: Medicare Other | Admitting: Cardiology

## 2014-04-08 VITALS — BP 104/62 | HR 68 | Ht 59.0 in | Wt 271.4 lb

## 2014-04-08 DIAGNOSIS — R0609 Other forms of dyspnea: Secondary | ICD-10-CM

## 2014-04-08 DIAGNOSIS — I509 Heart failure, unspecified: Secondary | ICD-10-CM

## 2014-04-08 DIAGNOSIS — I5032 Chronic diastolic (congestive) heart failure: Secondary | ICD-10-CM

## 2014-04-08 DIAGNOSIS — R0989 Other specified symptoms and signs involving the circulatory and respiratory systems: Secondary | ICD-10-CM

## 2014-04-09 ENCOUNTER — Ambulatory Visit: Payer: Medicare Other | Admitting: Internal Medicine

## 2014-04-11 ENCOUNTER — Encounter: Payer: Self-pay | Admitting: Internal Medicine

## 2014-04-11 ENCOUNTER — Telehealth: Payer: Self-pay | Admitting: *Deleted

## 2014-04-11 ENCOUNTER — Ambulatory Visit (INDEPENDENT_AMBULATORY_CARE_PROVIDER_SITE_OTHER): Payer: Medicare Other | Admitting: Internal Medicine

## 2014-04-11 VITALS — BP 130/78 | HR 55 | Ht 59.0 in | Wt 273.0 lb

## 2014-04-11 DIAGNOSIS — R0989 Other specified symptoms and signs involving the circulatory and respiratory systems: Secondary | ICD-10-CM

## 2014-04-11 DIAGNOSIS — R0609 Other forms of dyspnea: Secondary | ICD-10-CM

## 2014-04-11 DIAGNOSIS — E662 Morbid (severe) obesity with alveolar hypoventilation: Secondary | ICD-10-CM

## 2014-04-11 DIAGNOSIS — J9611 Chronic respiratory failure with hypoxia: Secondary | ICD-10-CM

## 2014-04-11 DIAGNOSIS — R0902 Hypoxemia: Secondary | ICD-10-CM

## 2014-04-11 DIAGNOSIS — J9610 Chronic respiratory failure, unspecified whether with hypoxia or hypercapnia: Secondary | ICD-10-CM

## 2014-04-11 DIAGNOSIS — I4891 Unspecified atrial fibrillation: Secondary | ICD-10-CM

## 2014-04-11 NOTE — Progress Notes (Signed)
Patient ID: [name], female    DOB: [birth year], 73 y.o.   MRN: [id number]  HPI  06/13/11- 79 yoF never smoker, hx of OSA on CPAP Hx Sarcoid,, PAF-chronic coumadin , Morbid obesity   Last here December 21, 2010 - Note reviewed Blames heat for lack energy. She also had right breast lumpectomy and XRT in February. Now pending right knee replacement next week.  Got good report at cardiology last week, following for her AFIb. She had stopped ritalin as ineffective for complaints of tiredness before.  Continues CPAPat 9 cwp.   08/05/2011 Follow up  Pt presents today for follow up of O2. PT recently had R. TKR on 06/20/11 w/ rehab stay. She did have some desaturations post op and was started on o2 at 2l /m . Per pt she was discharged to rehab on o2. She had swelling in right lower leg and had venous doppler that was neg for DVT. She is on chronic coumadin for hx of a fib. She says she does wear out easily and has DOE. Does fine at rest w/ no dyspnea.  Wants to see if she can get off O2. Today in office O2 sat at rest was 94%. Walking O2 sat is 92% w/out desaturations.  Denies chest pain or hemoptysis. Weight is down 6 lbs since last ov.  No increased cough or congestion  Wearing CPAP each night  She was discharge home on 08/02/11 . No records from rehab available at todays visit. Will attempt to obtain.   09/22/11-69 yoF never smoker, hx of OSA on CPAP Hx Sarcoid,, PAF-chronic coumadin , Morbid obesity She is staying on her oxygen most of the time. Consider comfortably on room air for a while at rest is is it for sleep and exertion. She is having difficulty getting around with a walker and managing her oxygen tank. Her home care company says it does not have a small portable type that would work for her. She continues her CPAP every night/[facility]. Says she has lost 22 pounds since her knee replacement surgery.  1//3/12- - 69 yoF never smoker, hx of OSA on CPAP Hx Sarcoid,, PAF-chronic coumadin ,  Morbid obesity Still having SOB, slight wheezing at times. cough-productive-clear in color; denies any fever and chills She got portable oxygen/[facility]. Comfortable with her CPAP and using it every night 3 at Has felt sick since Thanksgiving. Now she just doesn't feel she can clear her airways of mucus. She took 2 rounds of antibiotics. Using her nebulizer machine.  05/04/12- 20 yoF never smoker, hx of OSA on CPAP Hx Sarcoid,, PAF-chronic coumadin , Morbid obesity Breathing is fine as along as using O2 as needed Uses oxygen at 2 L for sleep and when needed. She may need to be requalified. Occasional minor wheeze in the mornings does not bother her enough to use her rescue inhaler.  11/02/12- 44 yoF never smoker, hx of OSA on CPAP Hx Sarcoid,, PAF-chronic coumadin , Morbid obesity FOLLOWS FOR: SOB (mostly with activity) and wheezing; Still using O2 as needed Has had flu vaccine. She is pleased to report no issues at all with no recent colds or respiratory events. She is trying to lose some weight. Using oxygen 2L/ [facility] for sleep and if needed.  06/06/13-71 yoF never smoker, hx of OSA on CPAP Hx Sarcoid,, PAFib-chronic coumadin , Morbid obesity Review of Systems-see HPI FOLLOWS FOR: Post hospital; Continues to have SOB-worse with heat Hospitalized with aspiration pneumonia and then with acute GI  bleed. Now denies abdominal pain, nausea obvious blood loss. She was left on theophylline. Still feels "washed out and tired" CT chest 05/11/13- IMPRESSION:  1. No evidence pulmonary embolism.  2. Right greater than left lower lobe predominant airspace  disease, most consistent with infection.  3. Scattered pulmonary nodules. Nonspecific, especially given the  clinical history of sarcoidosis and breast cancer. Consider short-  term CT follow-up at 3 months to exclude metastasis.  4. Thoracic adenopathy, which could be reactive or due to  sarcoidosis. Metastatic disease cannot be excluded. This  could  also be reevaluated at follow-up.  5. Small hiatal hernia.  6. 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  Original Report Authenticated By: Abigail Miyamoto, M.D  08/08/13- 65 yoF never smoker, hx of OSA on CPAP Hx Sarcoid,, PAF-chronic coumadin , Morbid obesity FOLLOWS FOR: stay indoors as much as possible due to heat; denies any wheezing, SOB, cough, or congestion. Nausea and vomiting 2 days ago and again this morning, starting after she was up and around. No blood or pain. We think aspiration caused the pneumonia for which she was hospitalized earlier this year. Oxygen 2 L/[facility] CPAP 9  10/31/13- 72 yoF never smoker, hx of OSA on CPAP Hx Sarcoid,, PAF-chronic coumadin , Morbid obesity Pt suspects pna.  Pt c/o nonprod cough, SOB, wheezing. She had right greater than left effusions on CT scan in October, addressed with diuretic. Describes cough off-and-on for 5 or 6 weeks, nonproductive. Nebulizer helps some. No fever or chest pain. Has had several rounds of antibiotics at [facility]. Continues CPAP 9 with oxygen 2 L/[facility] CT chest 09/26/13 IMPRESSION:  1. Stable small bilateral pulmonary nodules. Accordingly these were  not acute inflammatory nodules on the prior exam from May 2014.  Scattered pulmonary nodules were noted on the prior CT chest from  2002 which is not available for direct comparison. Accordingly,  these likely represent benign chronic nodules, possibly associated  with old granulomatous disease or sarcoidosis. It may be prudent to  continue to monitor with chest CT in 6-12 months.  2. New moderate right and small left pleural effusions with passive  atelectasis.  3. Lower lobe airway thickening bilaterally, causing luminal  narrowing and contributing to the underlying atelectasis in the lung  bases.  4. Skin thickening in the right breast with inverted nipple. Skin  thickening was also noted on the breast ultrasound 1 month  ago.  Differential diagnosis: Continued mastitis versus inflammatory  breast cancer. Correlate with any clinical improvement/clinical  signs. Biopsy may be warranted.  5. Mediastinal adenopathy is mostly stable, although an AP window  lymph node is slightly more prominent. This could be due to passive  congestion, sarcoidosis, or malignancy.  Electronically Signed  By: Sherryl Barters M.D.  On: 09/20/2013 14:33  01/31/14-  72 yoF never smoker, hx of OSA on CPAP Hx Sarcoid,, PAF-chronic coumadin , Morbid obesity FOLLOWS FOR:  Breathing doing well--No concerns today.  Wearing CPAP 9/ O2 2L/  6-8 hours per night [facility]. Hospitalized 2/13-19 - acute on chronic diastolic congestive heart failure. Still weak and tired. CXR 10/31/13 IMPRESSION:  1. Right pleural effusion.  2. Bibasilar atelectasis versus infiltrates.  Similar findings noted on recent chest CT of 09/20/2013. These  findings are new from prior chest x-ray of 08/18/2013 .  Electronically Signed  By: Marcello Moores Register  On: 10/31/2013 15:42  04/11/14- 56 yoF never smoker, hx of OSA on CPAP Hx Sarcoid,, PAF-chronic coumadin , Morbid obesity  FOLLOWS FOR:  Still having some cough (non-productive) and sob increased since last OV.  Wearing CPAP 9/ [facility]  6 hours per night              Son here Admits minor cough but feels she is doing okay. Admits a little daytime sleepiness but also says that is okay with her. She continues her sedentary lifestyle. No medication concerns or acute issues. CXR 03/25/14 IMPRESSION:  Clearing of right-sided airspace disease and right pleural effusion.  Mild cardiomegaly with nonspecific interstitial thickening.  Electronically Signed  By: Abigail Miyamoto M.D.  On: 03/25/2014 13:51  ROS-see HPI Constitutional:  No--weight loss, no-night sweats, fevers, chills, +fatigue, lassitude. HEENT:   No-  headaches, difficulty swallowing, tooth/dental problems, sore throat,       No-  sneezing, itching, ear ache,  nasal congestion, post nasal drip,  CV:  No-   chest pain, orthopnea, PND, swelling in lower extremities, anasarca,  dizziness, palpitations Resp: + shortness of breath with exertion, not at rest.              No-   productive cough, non-productive cough,  No-  coughing up of blood.              No-   change in color of mucus.  No- wheezing.   Skin: No-   rash or lesions. GI:  No-   heartburn, indigestion, abdominal pain, nausea, vomiting, GU:  MS:  No-   joint pain or swelling.   Neuro-  nothing unusual Psych:  No- change in mood or affect. No depression or anxiety.  No memory loss.  Objective:   Physical Exam General- Alert, Oriented, Affect-appropriate, Distress- none acute, morbidly obese, walker             portable oxygen 2L/ 95% Skin- looks pale Lymphadenopathy- none Head- atraumatic            Eyes- Gross vision intact, PERRLA, conjunctivae clear secretions            Ears- Hearing, canals-normal            Nose- Clear, no-Septal dev, mucus, polyps, erosion, perforation             Throat- Mallampati II , mucosa clear , drainage- none, tonsils- atrophic Neck- flexible , trachea midline, no stridor , thyroid nl, carotid no bruit Chest - symmetrical excursion , unlabored           Heart/CV- nearly regular with occasional skipped , no murmur , no gallop  , no rub, nl s1                s2              - JVD- none , 1+ edema bilaterally/ elastic hose, stasis changes- none, varices-                                    none  Lung-  clear,  no- cough , dullness-none, rub- none           Chest wall-  Abd-  Br/ Gen/ Rectal- Not done, not indicated Extrem- +Walker Neuro- grossly intact to observation

## 2014-04-11 NOTE — Telephone Encounter (Signed)
[facility] to [facility] 

## 2014-04-11 NOTE — Patient Instructions (Signed)
We can continue your current meds and CPAP  Please call as needed

## 2014-04-14 NOTE — Progress Notes (Signed)
Date:  04/08/13  ID:  [name], DOB 1941-03-17, MRN [id number]  PCP:  Shirline Frees, MD  Primary Cardiologist:  HIlty     History of Present Illness: The patient is a49 y/o female, followed by Dr Debara Pickett, with chronic AF on Eliquis, COPD, chronic diastolic CHF (EF 84-16% 60/63), breast cancer, and morbid obesity with hypoventilation syndrome and sarcoid. She lives alone but she has a son that lives nearby. Her mobility is limited, she uses a walker. She was recently admitted to [facility] on 01/24/14 with acute on chronic diastolic CHF. She was treated with IV diuretics. She diuresed 7 lbs with improvement in her symptoms. She was discharged on 01/30/14.   She has been seen multiple times since then in clinic with complaints of dyspnea, weight gain and LEE and has subquently required changes to her diuretic regimen.   She called our office on 4/23  complaining of dyspnea and a 3 lb weight gain. She was instructed to follow certain medication changes. Her diuretic was changed from Lasix to Torsemide and was advised to use PRN metolazone. She was also instructed to d/c albuterol and change to Xopenex. She was instructed to obtain a BNP and BMP on 04/07/14 and was scheduled for an appointment today, 4/28.  She presents back to clinic. She is accompanied by her son. She states that she feels slightly better, but she continues to have frequent DOE. She denies orthopnea/ PND and chest pain. Labs were fairly unremarkable. Her BNP was WNL at 63.9. Scr was 1.09 and potassium was also normal at 3.9.   Wt Readings from Last 3 Encounters:  04/11/14 273 lb (123.832 kg)  04/08/14 271 lb 6.4 oz (123.106 kg)  03/24/14 268 lb (121.564 kg)     Past Medical History  Diagnosis Date  . Asthma   . Bronchitis   . Hernia   . Anemia   . Sarcoidosis   . Morbid obesity   . Sarcoid   . Dyslipidemia   . Cough   . Wheezing   . Chills   . Constipation   . Bruises easily   . Gout attack     ankle, then  wrist and hands  . Hypertension   . Atrial fibrillation, persistent 11/01/2013  . Chronic anticoagulation, on Eliquis 11/01/2013  . History of nuclear stress test 02/09/2010    dipyridamole; normal pattern of perfusion in all regions with attenuation artifact in inferior region, low risk   . Fracture of left lower leg 06/1972    "no surgery; just casted it"  . CHF (congestive heart failure)   . Pneumonia 2012; 2014  . Chronic bronchitis   . OSA on CPAP   . Type II diabetes mellitus   . Uterine cancer     s/p hysterectomy  . Breast cancer, stage 1 10/17/2011    s/p right lumpectomy and XRT  . On home oxygen therapy     "2L prn" (01/24/2014)    Current Outpatient Prescriptions  Medication Sig Dispense Refill  . acetaminophen (TYLENOL) 500 MG tablet Take 1,000 mg by mouth every 6 (six) hours as needed. pain      . alendronate (FOSAMAX) 70 MG tablet Take 1 tablet (70 mg total) by mouth once a week. Take with a full glass of water on an empty stomach.  12 tablet  6  . allopurinol (ZYLOPRIM) 100 MG tablet Take 200 mg by mouth daily.       Marland Kitchen apixaban (ELIQUIS) 5 MG  TABS tablet Take 1 tablet (5 mg total) by mouth 2 (two) times daily.  60 tablet  6  . Ascorbic Acid (VITAMIN C) 1000 MG tablet Take 1,000 mg by mouth daily.      Marland Kitchen CALCIUM PO Take by mouth 2 (two) times daily.      . Cholecalciferol (VITAMIN D-3 PO) Take by mouth daily.      . colchicine 0.6 MG tablet Take 1 tablet (0.6 mg total) by mouth 2 (two) times daily.  60 tablet  0  . diltiazem (CARDIZEM CD) 240 MG 24 hr capsule Take 1 capsule by mouth daily.      Mariane Baumgarten Calcium (STOOL SOFTENER PO) Take by mouth daily.      Marland Kitchen exemestane (AROMASIN) 25 MG tablet TAKE 1 TABLET BY MOUTH DAILY AFTER BREAKFAST  30 tablet  12  . ferrous fumarate (HEMOCYTE - 106 MG FE) 325 (106 FE) MG TABS Take 1 tablet by mouth 2 (two) times daily.       . Fluticasone-Salmeterol (ADVAIR) 250-50 MCG/DOSE AEPB Inhale 1 puff into the lungs 2 (two) times daily.       . furosemide (LASIX) 80 MG tablet Take 80-160 mg by mouth 2 (two) times daily. 160mg  AM 80mg  PM      . guaiFENesin (MUCINEX) 600 MG 12 hr tablet Take 1,200 mg by mouth 2 (two) times daily.      Marland Kitchen HUMULIN N 100 UNIT/ML injection Inject 5 Units into the skin as needed (5 units if blood sugar is greater than 150).       . insulin glargine (LANTUS) 100 UNIT/ML injection Inject 0.28 mLs (28 Units total) into the skin at bedtime.  10 mL  12  . ipratropium (ATROVENT) 0.02 % nebulizer solution Take 2.5 mLs (0.5 mg total) by nebulization every 6 (six) hours.  75 mL  0  . levalbuterol (XOPENEX HFA) 45 MCG/ACT inhaler Inhale 2 puffs into the lungs every 6 (six) hours as needed for wheezing.  1 Inhaler  5  . LORazepam (ATIVAN) 0.5 MG tablet Take one tablet by mouth every night at bedtime as needed for anxiety  30 tablet  0  . losartan (COZAAR) 50 MG tablet Take 1 tablet by mouth daily.      . meclizine (ANTIVERT) 25 MG tablet Take 6.25-12.5 mg by mouth 3 (three) times daily as needed for dizziness.       . metFORMIN (GLUCOPHAGE) 500 MG tablet Take 500 mg by mouth 2 (two) times daily with a meal. If cbg is greater than 120 pt takes medication      . metolazone (ZAROXOLYN) 5 MG tablet Take 0.5 tablets (2.5 mg total) by mouth daily. Take 30 minutes prior to AM dose of lasix      . Multiple Vitamin (MULTIVITAMIN WITH MINERALS) TABS Take 1 tablet by mouth daily.      . polyethylene glycol (MIRALAX / GLYCOLAX) packet Take 17 g by mouth daily.  14 each  0  . spironolactone (ALDACTONE) 25 MG tablet Take 0.5 tablets (12.5 mg total) by mouth 2 (two) times daily.  30 tablet  11  . torsemide (DEMADEX) 20 MG tablet Take 2 tablets (40 mg total) by mouth 2 (two) times daily.  120 tablet  1   No current facility-administered medications for this visit.    Allergies:    Allergies  Allergen Reactions  . Aspirin     Avoids due to being on blood thinners  . Contrast Media [Iodinated Diagnostic Agents]  Nausea And Vomiting   . Ibuprofen     Avoids due to being on blood thinners  . Pravachol Other (See Comments)    Muscle pain  . Pravastatin Sodium     Social History:  The patient  reports that she has never smoked. She has never used smokeless tobacco. She reports that she does not drink alcohol or use illicit drugs.   Family history:   Family History  Problem Relation Age of Onset  . Diabetes Mother   . Colon cancer Father   . Diabetes Brother   . Lung cancer Brother   . Diabetes Sister   . Cervical cancer Sister     ROS:  Please see the history of present illness.  All other systems reviewed and negative.   PHYSICAL EXAM: VS:  BP 104/62  Pulse 68  Ht 4\' 11"  (1.499 m)  Wt 271 lb 6.4 oz (123.106 kg)  BMI 54.79 kg/m2 Obese, well developed, in no acute distress HEENT: Pupils are equal round react to light accommodation extraocular movements are intact.  Neck: no apparent JVDNo cervical lymphadenopathy. Cardiac: Regular rate and rhythm without murmurs rubs or gallops. Lungs:  clear to auscultation bilaterally, mild diffuse wheezing, no rhonchi or rales Ext: no lower extremity edema.  2+ radial and dorsalis pedis pulses. Skin: warm and dry Neuro:  Grossly normal    ASSESSMENT AND PLAN:   Dyspnea on Exertion No associated chest pain. She appears euvolemic on exam. There are diffuse wheezing bilaterally. Labs obtained yesterday revealed a normal BNP of 63.9, suggesting her dyspnea is non cardiac. With h/o COPD, morbid obesity with hypoventilation syndrome and sarcoid, this is likely a pulmonary issue. I instructed her to f/u with her pulmonologist, Dr. Annamaria Boots, sooner than later.   Chronic Diastolic CHF Stable. Euvolemic on exam. BNP WNL. Continue current medical regimen  Plan: Continue current meds for chronic diastolic HF. F/u with Dr. Annamaria Boots for evaluation for worsening dyspnea.   Lyda Jester, PA-C

## 2014-04-15 DIAGNOSIS — R0609 Other forms of dyspnea: Principal | ICD-10-CM

## 2014-04-15 NOTE — Assessment & Plan Note (Signed)
No associated chest pain. She appears euvolemic on exam. There are diffuse wheezing bilaterally. Labs obtained yesterday revealed a normal BNP of 63.9, suggesting her dyspnea is non cardiac. With h/o COPD, morbid obesity with hypoventilation syndrome and sarcoid, this is likely a pulmonary issue.

## 2014-04-15 NOTE — Assessment & Plan Note (Signed)
Stable. Euvolemic on exam. BNP WNL. Continue current medical regimen,

## 2014-04-17 ENCOUNTER — Emergency Department (HOSPITAL_COMMUNITY): Payer: Medicare Other

## 2014-04-17 ENCOUNTER — Observation Stay (HOSPITAL_COMMUNITY): Payer: Medicare Other

## 2014-04-17 ENCOUNTER — Inpatient Hospital Stay (HOSPITAL_COMMUNITY)
Admission: EM | Admit: 2014-04-17 | Discharge: 2014-04-23 | DRG: 690 | Disposition: A | Discharge status: Skilled Nursing Facility | Payer: Medicare Other | Attending: Internal Medicine | Admitting: Internal Medicine

## 2014-04-17 ENCOUNTER — Encounter (HOSPITAL_COMMUNITY): Payer: Self-pay | Admitting: Emergency Medicine

## 2014-04-17 DIAGNOSIS — E1165 Type 2 diabetes mellitus with hyperglycemia: Secondary | ICD-10-CM

## 2014-04-17 DIAGNOSIS — E875 Hyperkalemia: Secondary | ICD-10-CM | POA: Diagnosis present

## 2014-04-17 DIAGNOSIS — J9610 Chronic respiratory failure, unspecified whether with hypoxia or hypercapnia: Secondary | ICD-10-CM | POA: Diagnosis present

## 2014-04-17 DIAGNOSIS — R531 Weakness: Secondary | ICD-10-CM

## 2014-04-17 DIAGNOSIS — Z7901 Long term (current) use of anticoagulants: Secondary | ICD-10-CM

## 2014-04-17 DIAGNOSIS — Z886 Allergy status to analgesic agent status: Secondary | ICD-10-CM

## 2014-04-17 DIAGNOSIS — J9691 Respiratory failure, unspecified with hypoxia: Secondary | ICD-10-CM

## 2014-04-17 DIAGNOSIS — Z853 Personal history of malignant neoplasm of breast: Secondary | ICD-10-CM

## 2014-04-17 DIAGNOSIS — R911 Solitary pulmonary nodule: Secondary | ICD-10-CM | POA: Diagnosis present

## 2014-04-17 DIAGNOSIS — IMO0001 Reserved for inherently not codable concepts without codable children: Secondary | ICD-10-CM | POA: Diagnosis present

## 2014-04-17 DIAGNOSIS — R7309 Other abnormal glucose: Secondary | ICD-10-CM

## 2014-04-17 DIAGNOSIS — J4489 Other specified chronic obstructive pulmonary disease: Secondary | ICD-10-CM | POA: Diagnosis present

## 2014-04-17 DIAGNOSIS — I509 Heart failure, unspecified: Secondary | ICD-10-CM

## 2014-04-17 DIAGNOSIS — I4819 Other persistent atrial fibrillation: Secondary | ICD-10-CM

## 2014-04-17 DIAGNOSIS — Z801 Family history of malignant neoplasm of trachea, bronchus and lung: Secondary | ICD-10-CM

## 2014-04-17 DIAGNOSIS — Z833 Family history of diabetes mellitus: Secondary | ICD-10-CM

## 2014-04-17 DIAGNOSIS — H9190 Unspecified hearing loss, unspecified ear: Secondary | ICD-10-CM | POA: Diagnosis present

## 2014-04-17 DIAGNOSIS — M109 Gout, unspecified: Secondary | ICD-10-CM | POA: Diagnosis present

## 2014-04-17 DIAGNOSIS — D869 Sarcoidosis, unspecified: Secondary | ICD-10-CM

## 2014-04-17 DIAGNOSIS — I5032 Chronic diastolic (congestive) heart failure: Secondary | ICD-10-CM | POA: Diagnosis present

## 2014-04-17 DIAGNOSIS — Z8049 Family history of malignant neoplasm of other genital organs: Secondary | ICD-10-CM

## 2014-04-17 DIAGNOSIS — J690 Pneumonitis due to inhalation of food and vomit: Secondary | ICD-10-CM

## 2014-04-17 DIAGNOSIS — J441 Chronic obstructive pulmonary disease with (acute) exacerbation: Secondary | ICD-10-CM | POA: Diagnosis present

## 2014-04-17 DIAGNOSIS — Z8544 Personal history of malignant neoplasm of other female genital organs: Secondary | ICD-10-CM

## 2014-04-17 DIAGNOSIS — R55 Syncope and collapse: Secondary | ICD-10-CM | POA: Diagnosis present

## 2014-04-17 DIAGNOSIS — B369 Superficial mycosis, unspecified: Secondary | ICD-10-CM

## 2014-04-17 DIAGNOSIS — Z79899 Other long term (current) drug therapy: Secondary | ICD-10-CM

## 2014-04-17 DIAGNOSIS — E785 Hyperlipidemia, unspecified: Secondary | ICD-10-CM | POA: Diagnosis present

## 2014-04-17 DIAGNOSIS — R42 Dizziness and giddiness: Secondary | ICD-10-CM | POA: Diagnosis present

## 2014-04-17 DIAGNOSIS — E119 Type 2 diabetes mellitus without complications: Secondary | ICD-10-CM | POA: Diagnosis present

## 2014-04-17 DIAGNOSIS — I482 Chronic atrial fibrillation, unspecified: Secondary | ICD-10-CM | POA: Diagnosis present

## 2014-04-17 DIAGNOSIS — Z800 Family history of malignant neoplasm of digestive organs: Secondary | ICD-10-CM

## 2014-04-17 DIAGNOSIS — Z794 Long term (current) use of insulin: Secondary | ICD-10-CM

## 2014-04-17 DIAGNOSIS — I10 Essential (primary) hypertension: Secondary | ICD-10-CM

## 2014-04-17 DIAGNOSIS — Z888 Allergy status to other drugs, medicaments and biological substances status: Secondary | ICD-10-CM

## 2014-04-17 DIAGNOSIS — R609 Edema, unspecified: Secondary | ICD-10-CM

## 2014-04-17 DIAGNOSIS — C50311 Malignant neoplasm of lower-inner quadrant of right female breast: Secondary | ICD-10-CM | POA: Diagnosis present

## 2014-04-17 DIAGNOSIS — Z923 Personal history of irradiation: Secondary | ICD-10-CM

## 2014-04-17 DIAGNOSIS — G4733 Obstructive sleep apnea (adult) (pediatric): Secondary | ICD-10-CM | POA: Diagnosis present

## 2014-04-17 DIAGNOSIS — E871 Hypo-osmolality and hyponatremia: Secondary | ICD-10-CM | POA: Diagnosis present

## 2014-04-17 DIAGNOSIS — J9611 Chronic respiratory failure with hypoxia: Secondary | ICD-10-CM | POA: Diagnosis present

## 2014-04-17 DIAGNOSIS — N390 Urinary tract infection, site not specified: Principal | ICD-10-CM | POA: Diagnosis present

## 2014-04-17 DIAGNOSIS — J449 Chronic obstructive pulmonary disease, unspecified: Secondary | ICD-10-CM | POA: Diagnosis present

## 2014-04-17 DIAGNOSIS — R739 Hyperglycemia, unspecified: Secondary | ICD-10-CM | POA: Diagnosis present

## 2014-04-17 DIAGNOSIS — Z6841 Body Mass Index (BMI) 40.0 and over, adult: Secondary | ICD-10-CM

## 2014-04-17 DIAGNOSIS — R112 Nausea with vomiting, unspecified: Secondary | ICD-10-CM | POA: Diagnosis present

## 2014-04-17 DIAGNOSIS — Z9981 Dependence on supplemental oxygen: Secondary | ICD-10-CM

## 2014-04-17 DIAGNOSIS — I4891 Unspecified atrial fibrillation: Secondary | ICD-10-CM | POA: Diagnosis present

## 2014-04-17 DIAGNOSIS — E860 Dehydration: Secondary | ICD-10-CM | POA: Diagnosis present

## 2014-04-17 LAB — URINALYSIS, ROUTINE W REFLEX MICROSCOPIC
Bilirubin Urine: NEGATIVE
Glucose, UA: >1000 mg/dL — AB
HGB URINE DIPSTICK: NEGATIVE
Ketones, ur: NEGATIVE mg/dL
Leukocytes, UA: NEGATIVE
Nitrite: NEGATIVE
PROTEIN: NEGATIVE mg/dL
Specific Gravity, Urine: 1.021 (ref 1.005–1.030)
UROBILINOGEN UA: 0.2 mg/dL (ref 0.0–1.0)
pH: 7 (ref 5.0–8.0)

## 2014-04-17 LAB — CBC WITH DIFFERENTIAL/PLATELET
Basophils Absolute: 0 10*3/uL (ref 0.0–0.1)
Basophils Relative: 0 % (ref 0–1)
Eosinophils Absolute: 0 10*3/uL (ref 0.0–0.7)
Eosinophils Relative: 0 % (ref 0–5)
HEMATOCRIT: 46 % (ref 36.0–46.0)
Hemoglobin: 15.8 g/dL — ABNORMAL HIGH (ref 12.0–15.0)
LYMPHS ABS: 0.9 10*3/uL (ref 0.7–4.0)
LYMPHS PCT: 7 % — AB (ref 12–46)
MCH: 29.2 pg (ref 26.0–34.0)
MCHC: 34.3 g/dL (ref 30.0–36.0)
MCV: 84.9 fL (ref 78.0–100.0)
MONO ABS: 0.6 10*3/uL (ref 0.1–1.0)
MONOS PCT: 4 % (ref 3–12)
Neutro Abs: 11.5 10*3/uL — ABNORMAL HIGH (ref 1.7–7.7)
Neutrophils Relative %: 89 % — ABNORMAL HIGH (ref 43–77)
Platelets: 202 10*3/uL (ref 150–400)
RBC: 5.42 MIL/uL — AB (ref 3.87–5.11)
RDW: 14 % (ref 11.5–15.5)
WBC: 13 10*3/uL — AB (ref 4.0–10.5)

## 2014-04-17 LAB — COMPREHENSIVE METABOLIC PANEL
ALT: 24 U/L (ref 0–35)
AST: 17 U/L (ref 0–37)
Albumin: 3.9 g/dL (ref 3.5–5.2)
Alkaline Phosphatase: 245 U/L — ABNORMAL HIGH (ref 39–117)
BILIRUBIN TOTAL: 0.7 mg/dL (ref 0.3–1.2)
BUN: 51 mg/dL — ABNORMAL HIGH (ref 6–23)
CHLORIDE: 90 meq/L — AB (ref 96–112)
CO2: 29 meq/L (ref 19–32)
CREATININE: 0.95 mg/dL (ref 0.50–1.10)
Calcium: 9.1 mg/dL (ref 8.4–10.5)
GFR calc Af Amer: 67 mL/min — ABNORMAL LOW (ref >90)
GFR, EST NON AFRICAN AMERICAN: 58 mL/min — AB (ref >90)
Glucose, Bld: 579 mg/dL (ref 70–99)
Potassium: 5.4 mEq/L — ABNORMAL HIGH (ref 3.7–5.3)
Sodium: 132 mEq/L — ABNORMAL LOW (ref 137–147)
Total Protein: 7.2 g/dL (ref 6.0–8.3)

## 2014-04-17 LAB — GLUCOSE, CAPILLARY: GLUCOSE-CAPILLARY: 265 mg/dL — AB (ref 70–99)

## 2014-04-17 LAB — URINE MICROSCOPIC-ADD ON

## 2014-04-17 LAB — TROPONIN I: Troponin I: <0.3 ng/mL (ref <0.30)

## 2014-04-17 MED ORDER — ACETAMINOPHEN 325 MG PO TABS
650.0000 mg | ORAL_TABLET | Freq: Four times a day (QID) | ORAL | Status: DC | PRN
  Administered 2014-04-20: 650 mg via ORAL
  Filled 2014-04-17: qty 2

## 2014-04-17 MED ORDER — MECLIZINE HCL 25 MG PO TABS
25.0000 mg | ORAL_TABLET | Freq: Three times a day (TID) | ORAL | Status: DC | PRN
  Administered 2014-04-18: 25 mg via ORAL
  Filled 2014-04-17 (×2): qty 1

## 2014-04-17 MED ORDER — ACETAMINOPHEN 650 MG RE SUPP: 650.0000 mg | Freq: Four times a day (QID) | RECTAL | Status: DC | PRN

## 2014-04-17 MED ORDER — SODIUM CHLORIDE 0.9 % IV SOLN
INTRAVENOUS | Status: DC
  Administered 2014-04-17: 17:00:00 via INTRAVENOUS

## 2014-04-17 MED ORDER — METFORMIN HCL 500 MG PO TABS
500.0000 mg | ORAL_TABLET | Freq: Two times a day (BID) | ORAL | Status: DC
  Administered 2014-04-18 – 2014-04-22 (×10): 500 mg via ORAL
  Filled 2014-04-17 (×14): qty 1

## 2014-04-17 MED ORDER — PREDNISONE 10 MG PO TABS
10.0000 mg | ORAL_TABLET | Freq: Every day | ORAL | Status: AC
  Administered 2014-04-21: 10 mg via ORAL
  Filled 2014-04-17 (×2): qty 1

## 2014-04-17 MED ORDER — DILTIAZEM HCL ER COATED BEADS 240 MG PO CP24
240.0000 mg | ORAL_CAPSULE | Freq: Every evening | ORAL | Status: DC
  Administered 2014-04-17 – 2014-04-22 (×6): 240 mg via ORAL
  Filled 2014-04-17 (×7): qty 1

## 2014-04-17 MED ORDER — ONDANSETRON HCL 4 MG PO TABS: 4.0000 mg | ORAL_TABLET | Freq: Four times a day (QID) | ORAL | Status: DC | PRN

## 2014-04-17 MED ORDER — POLYETHYLENE GLYCOL 3350 17 G PO PACK
17.0000 g | PACK | Freq: Every day | ORAL | Status: DC | PRN
  Filled 2014-04-17: qty 1

## 2014-04-17 MED ORDER — LEVALBUTEROL HCL 0.63 MG/3ML IN NEBU
0.6300 mg | INHALATION_SOLUTION | Freq: Three times a day (TID) | RESPIRATORY_TRACT | Status: DC
  Administered 2014-04-18 – 2014-04-19 (×5): 0.63 mg via RESPIRATORY_TRACT
  Filled 2014-04-17 (×13): qty 3

## 2014-04-17 MED ORDER — SODIUM CHLORIDE 0.9 % IV SOLN: INTRAVENOUS | Status: AC

## 2014-04-17 MED ORDER — PREDNISONE 20 MG PO TABS
20.0000 mg | ORAL_TABLET | Freq: Every day | ORAL | Status: AC
  Administered 2014-04-19: 20 mg via ORAL
  Filled 2014-04-17: qty 1

## 2014-04-17 MED ORDER — INSULIN ASPART 100 UNIT/ML ~~LOC~~ SOLN
10.0000 [IU] | Freq: Once | SUBCUTANEOUS | Status: DC
  Administered 2014-04-17: 10 [IU] via SUBCUTANEOUS
  Filled 2014-04-17: qty 1

## 2014-04-17 MED ORDER — INSULIN ASPART 100 UNIT/ML ~~LOC~~ SOLN: 15.0000 [IU] | Freq: Once | SUBCUTANEOUS | Status: DC

## 2014-04-17 MED ORDER — MOMETASONE FURO-FORMOTEROL FUM 100-5 MCG/ACT IN AERO
2.0000 | INHALATION_SPRAY | Freq: Two times a day (BID) | RESPIRATORY_TRACT | Status: DC
  Administered 2014-04-17 – 2014-04-23 (×11): 2 via RESPIRATORY_TRACT
  Filled 2014-04-17: qty 8.8

## 2014-04-17 MED ORDER — APIXABAN 5 MG PO TABS
5.0000 mg | ORAL_TABLET | Freq: Two times a day (BID) | ORAL | Status: DC
  Administered 2014-04-17 – 2014-04-23 (×12): 5 mg via ORAL
  Filled 2014-04-17 (×13): qty 1

## 2014-04-17 MED ORDER — COLCHICINE 0.6 MG PO TABS
0.6000 mg | ORAL_TABLET | Freq: Two times a day (BID) | ORAL | Status: DC
  Administered 2014-04-17 – 2014-04-23 (×12): 0.6 mg via ORAL
  Filled 2014-04-17 (×13): qty 1

## 2014-04-17 MED ORDER — DOCUSATE SODIUM 100 MG PO CAPS
100.0000 mg | ORAL_CAPSULE | Freq: Two times a day (BID) | ORAL | Status: DC
  Administered 2014-04-17 – 2014-04-23 (×12): 100 mg via ORAL
  Filled 2014-04-17 (×13): qty 1

## 2014-04-17 MED ORDER — SODIUM CHLORIDE 0.9 % IV SOLN
INTRAVENOUS | Status: AC
  Administered 2014-04-17 – 2014-04-19 (×3): via INTRAVENOUS

## 2014-04-17 MED ORDER — PREDNISONE 20 MG PO TABS
30.0000 mg | ORAL_TABLET | Freq: Every day | ORAL | Status: AC
  Administered 2014-04-18: 30 mg via ORAL
  Filled 2014-04-17 (×3): qty 1

## 2014-04-17 MED ORDER — SODIUM CHLORIDE 0.9 % IV BOLUS (SEPSIS)
500.0000 mL | Freq: Once | INTRAVENOUS | Status: AC
  Administered 2014-04-17: 500 mL via INTRAVENOUS

## 2014-04-17 MED ORDER — PREDNISONE 10 MG PO TABS
15.0000 mg | ORAL_TABLET | Freq: Every day | ORAL | Status: AC
  Administered 2014-04-20: 15 mg via ORAL
  Filled 2014-04-17 (×2): qty 1

## 2014-04-17 MED ORDER — ALUM & MAG HYDROXIDE-SIMETH 200-200-20 MG/5ML PO SUSP: 30.0000 mL | Freq: Four times a day (QID) | ORAL | Status: DC | PRN

## 2014-04-17 MED ORDER — SENNA 8.6 MG PO TABS
1.0000 | ORAL_TABLET | Freq: Two times a day (BID) | ORAL | Status: DC
  Administered 2014-04-17 – 2014-04-22 (×10): 8.6 mg via ORAL
  Filled 2014-04-17 (×9): qty 1

## 2014-04-17 MED ORDER — IPRATROPIUM BROMIDE 0.02 % IN SOLN
0.5000 mg | RESPIRATORY_TRACT | Status: DC
  Filled 2014-04-17: qty 2.5

## 2014-04-17 MED ORDER — ONDANSETRON HCL 4 MG/2ML IJ SOLN
4.0000 mg | Freq: Four times a day (QID) | INTRAMUSCULAR | Status: DC | PRN
  Administered 2014-04-18 (×2): 4 mg via INTRAVENOUS
  Filled 2014-04-17 (×2): qty 2

## 2014-04-17 MED ORDER — ALBUTEROL SULFATE (2.5 MG/3ML) 0.083% IN NEBU
3.0000 mL | INHALATION_SOLUTION | Freq: Four times a day (QID) | RESPIRATORY_TRACT | Status: DC | PRN
  Administered 2014-04-17: 3 mL via RESPIRATORY_TRACT
  Filled 2014-04-17: qty 3

## 2014-04-17 MED ORDER — GUAIFENESIN ER 600 MG PO TB12
1200.0000 mg | ORAL_TABLET | Freq: Two times a day (BID) | ORAL | Status: DC | PRN
Start: 2014-04-17 — End: 2014-04-23

## 2014-04-17 MED ORDER — ADULT MULTIVITAMIN W/MINERALS CH
1.0000 | ORAL_TABLET | Freq: Every day | ORAL | Status: DC
  Administered 2014-04-18 – 2014-04-23 (×6): 1 via ORAL
  Filled 2014-04-17 (×6): qty 1

## 2014-04-17 MED ORDER — ALLOPURINOL 100 MG PO TABS
200.0000 mg | ORAL_TABLET | Freq: Every day | ORAL | Status: DC
  Administered 2014-04-18 – 2014-04-23 (×6): 200 mg via ORAL
  Filled 2014-04-17 (×6): qty 2

## 2014-04-17 MED ORDER — PREDNISONE 5 MG PO TABS
5.0000 mg | ORAL_TABLET | Freq: Every day | ORAL | Status: AC
  Administered 2014-04-22: 5 mg via ORAL
  Filled 2014-04-17: qty 1

## 2014-04-17 MED ORDER — INSULIN ASPART 100 UNIT/ML ~~LOC~~ SOLN
0.0000 [IU] | Freq: Three times a day (TID) | SUBCUTANEOUS | Status: DC
  Administered 2014-04-18: 3 [IU] via SUBCUTANEOUS
  Administered 2014-04-18: 15 [IU] via SUBCUTANEOUS
  Administered 2014-04-18: 2 [IU] via SUBCUTANEOUS

## 2014-04-17 MED ORDER — LORAZEPAM 0.5 MG PO TABS
0.5000 mg | ORAL_TABLET | Freq: Every evening | ORAL | Status: DC | PRN
  Administered 2014-04-17 – 2014-04-22 (×6): 0.5 mg via ORAL
  Filled 2014-04-17 (×6): qty 1

## 2014-04-17 MED ORDER — VITAMIN C 500 MG PO TABS
1000.0000 mg | ORAL_TABLET | Freq: Every day | ORAL | Status: DC
  Administered 2014-04-18 – 2014-04-23 (×6): 1000 mg via ORAL
  Filled 2014-04-17 (×6): qty 2

## 2014-04-17 MED ORDER — INSULIN GLARGINE 100 UNIT/ML ~~LOC~~ SOLN
28.0000 [IU] | Freq: Every day | SUBCUTANEOUS | Status: DC
  Administered 2014-04-17 – 2014-04-22 (×6): 28 [IU] via SUBCUTANEOUS
  Filled 2014-04-17 (×8): qty 0.28

## 2014-04-17 MED ORDER — IPRATROPIUM BROMIDE 0.02 % IN SOLN
0.5000 mg | Freq: Three times a day (TID) | RESPIRATORY_TRACT | Status: DC
  Administered 2014-04-18 – 2014-04-19 (×5): 0.5 mg via RESPIRATORY_TRACT
  Filled 2014-04-17 (×6): qty 2.5

## 2014-04-17 MED ORDER — FERROUS FUMARATE 325 (106 FE) MG PO TABS
1.0000 | ORAL_TABLET | Freq: Two times a day (BID) | ORAL | Status: DC
  Administered 2014-04-17 – 2014-04-23 (×10): 106 mg via ORAL
  Filled 2014-04-17 (×13): qty 1

## 2014-04-17 MED ORDER — LEVALBUTEROL HCL 0.63 MG/3ML IN NEBU: 0.6300 mg | INHALATION_SOLUTION | Freq: Four times a day (QID) | RESPIRATORY_TRACT | Status: DC | PRN

## 2014-04-17 MED ORDER — EXEMESTANE 25 MG PO TABS
25.0000 mg | ORAL_TABLET | Freq: Every day | ORAL | Status: DC
  Administered 2014-04-18 – 2014-04-23 (×6): 25 mg via ORAL
  Filled 2014-04-17 (×8): qty 1

## 2014-04-17 NOTE — ED Notes (Signed)
Lab called with critical glucose of 579. RN L. Megan Salon made aware.

## 2014-04-17 NOTE — ED Notes (Signed)
Per EMS: Pt stood up out of chair, got dizzy, leaned on her walker and walker slipped. Pt fell face first, denies LOC. Pt hit forehead and lip on walker and fell to floor. Laceration to upper lip, bleeding controlled. Bruise to L knee.

## 2014-04-17 NOTE — ED Notes (Signed)
Bed: WA09 Expected date:  Expected time:  Means of arrival:  Comments: EMS- Elderly, dizziness, fall

## 2014-04-17 NOTE — ED Notes (Signed)
Patient was unable to stand to do the last set of vitals because she was dizzy

## 2014-04-17 NOTE — ED Notes (Signed)
Patient was wheezing when we were changing the bed sheets, asked if she was on oxygen at home. Patient stated she was on 2L at home, so I put her on 2L.

## 2014-04-17 NOTE — ED Notes (Signed)
Per EMS: A fib on monitor. Pt has hx of a-fib. CBG 441. Negative orthostatics.

## 2014-04-17 NOTE — ED Provider Notes (Signed)
CSN: [id number]     Arrival date & time 04/17/14  1651 History   First MD Initiated Contact with Patient 04/17/14 1707     Chief Complaint  Patient presents with  . Fall  . Dizziness     (Consider location/radiation/quality/duration/timing/severity/associated sxs/prior Treatment) Patient is a 73 y.o. female presenting with fall and dizziness. The history is provided by the patient.  Fall  Dizziness  patient here complaining of head injury after becoming dizzy when she stood up from a chair. She struck her face. No loss of consciousness. Denies any recent illnesses such as vomiting or diarrhea. She has no chest pain or shortness of breath. She is currently being treated for hearing loss with prednisone and has been hypoglycemic. Patient's CBG was 441. She complains of sharp pain to her upper lip. Denies any neck pain. No hip or back pain. This persisted and no treatment used prior to arrival.  Past Medical History  Diagnosis Date  . Asthma   . Bronchitis   . Hernia   . Anemia   . Sarcoidosis   . Morbid obesity   . Sarcoid   . Dyslipidemia   . Cough   . Wheezing   . Chills   . Constipation   . Bruises easily   . Gout attack     ankle, then wrist and hands  . Hypertension   . Atrial fibrillation, persistent 11/01/2013  . Chronic anticoagulation, on Eliquis 11/01/2013  . History of nuclear stress test 02/09/2010    dipyridamole; normal pattern of perfusion in all regions with attenuation artifact in inferior region, low risk   . Fracture of left lower leg 06/1972    "no surgery; just casted it"  . CHF (congestive heart failure)   . Pneumonia 2012; 2014  . Chronic bronchitis   . OSA on CPAP   . Type II diabetes mellitus   . Uterine cancer     s/p hysterectomy  . Breast cancer, stage 1 10/17/2011    s/p right lumpectomy and XRT  . On home oxygen therapy     "2L prn" (01/24/2014)   Past Surgical History  Procedure Laterality Date  . Total knee arthroplasty Bilateral 03/2010;  06/2011    left; right  . Abdominal hysterectomy  02/04/2005  . Umbilical hernia repair  02/04/2005  . Transthoracic echocardiogram  07/2008    EF, LV size is normal; RVSP normal; mod calcif of MV apparatus  . Breast biopsy Right 01/2011  . Breast lumpectomy Right 01/2011  . Tubal ligation  1970's  . Hernia repair     Family History  Problem Relation Age of Onset  . Diabetes Mother   . Colon cancer Father   . Diabetes Brother   . Lung cancer Brother   . Diabetes Sister   . Cervical cancer Sister    History  Substance Use Topics  . Smoking status: Never Smoker   . Smokeless tobacco: Never Used  . Alcohol Use: No   OB History   Grav Para Term Preterm Abortions TAB SAB Ect Mult Living                 Review of Systems  Neurological: Positive for dizziness.  All other systems reviewed and are negative.     Allergies  Aspirin; Contrast media; Ibuprofen; Pravachol; and Pravastatin sodium  Home Medications   Prior to Admission medications   Medication Sig Start Date End Date Taking? Authorizing Provider  acetaminophen (TYLENOL) 500 MG tablet Take 1,000  mg by mouth every 6 (six) hours as needed. pain   Yes Historical Provider, MD  alendronate (FOSAMAX) 70 MG tablet Take 1 tablet (70 mg total) by mouth once a week. Take with a full glass of water on an empty stomach. 03/10/14  Yes Deatra Robinson, MD  allopurinol (ZYLOPRIM) 100 MG tablet Take 200 mg by mouth daily.  11/04/12  Yes Historical Provider, MD  apixaban (ELIQUIS) 5 MG TABS tablet Take 1 tablet (5 mg total) by mouth 2 (two) times daily. 01/03/14  Yes Pixie Casino, MD  Ascorbic Acid (VITAMIN C) 1000 MG tablet Take 1,000 mg by mouth daily.   Yes Historical Provider, MD  colchicine 0.6 MG tablet Take 1 tablet (0.6 mg total) by mouth 2 (two) times daily. 11/22/12  Yes [name], MD  diltiazem (CARDIZEM CD) 240 MG 24 hr capsule Take 240 mg by mouth every evening.  01/20/14  Yes Historical Provider, MD  exemestane  (AROMASIN) 25 MG tablet Take 25 mg by mouth daily after breakfast.   Yes Historical Provider, MD  ferrous fumarate (HEMOCYTE - 106 MG FE) 325 (106 FE) MG TABS Take 1 tablet by mouth 2 (two) times daily.    Yes Historical Provider, MD  Fluticasone-Salmeterol (ADVAIR) 250-50 MCG/DOSE AEPB Inhale 1 puff into the lungs 2 (two) times daily.   Yes Historical Provider, MD  furosemide (LASIX) 80 MG tablet Take 80-160 mg by mouth 2 (two) times daily. 160mg  AM 80mg  PM 01/30/14  Yes Luke K Kilroy, PA-C  guaiFENesin (MUCINEX) 600 MG 12 hr tablet Take 1,200 mg by mouth 2 (two) times daily as needed for cough.    Yes Historical Provider, MD  HUMULIN N 100 UNIT/ML injection Inject 5 Units into the skin as needed (5 units if blood sugar is greater than 150).  02/05/13  Yes Historical Provider, MD  insulin glargine (LANTUS) 100 UNIT/ML injection Inject 0.28 mLs (28 Units total) into the skin at bedtime. 05/14/13  Yes Jessica U Vann, DO  ipratropium (ATROVENT) 0.02 % nebulizer solution Take 2.5 mLs (0.5 mg total) by nebulization every 6 (six) hours. 11/22/12  Yes [name], MD  levalbuterol (XOPENEX HFA) 45 MCG/ACT inhaler Inhale 2 puffs into the lungs every 6 (six) hours as needed for wheezing. 04/03/14  Yes Cecilie Kicks, NP  LORazepam (ATIVAN) 0.5 MG tablet Take one tablet by mouth every night at bedtime as needed for anxiety 11/07/13  Yes Theodis Blaze, MD  losartan (COZAAR) 50 MG tablet Take 1 tablet by mouth daily. 01/02/14  Yes Historical Provider, MD  meclizine (ANTIVERT) 25 MG tablet Take 25 mg by mouth 3 (three) times daily as needed for dizziness.    Yes Historical Provider, MD  metFORMIN (GLUCOPHAGE) 500 MG tablet Take 500 mg by mouth 2 (two) times daily with a meal. If cbg is greater than 120 pt takes medication   Yes Historical Provider, MD  metolazone (ZAROXOLYN) 5 MG tablet Take 0.5 tablets (2.5 mg total) by mouth daily. Take 30 minutes prior to AM dose of lasix 04/01/14  Yes Tarri Fuller, PA-C  Multiple  Vitamin (MULTIVITAMIN WITH MINERALS) TABS Take 1 tablet by mouth daily.   Yes Historical Provider, MD  spironolactone (ALDACTONE) 25 MG tablet Take 0.5 tablets (12.5 mg total) by mouth 2 (two) times daily.   Yes Pixie Casino, MD  torsemide (DEMADEX) 20 MG tablet Take 2 tablets (40 mg total) by mouth 2 (two) times daily. 04/03/14  Yes Cecilie Kicks, NP  BP 108/62  Pulse 88  Temp(Src) 97.6 F (36.4 C) (Oral)  Resp 26  Wt 273 lb (123.832 kg)  SpO2 95% Physical Exam  Nursing note and vitals reviewed. Constitutional: She is oriented to person, place, and time. She appears well-developed and well-nourished.  Non-toxic appearance. No distress.  HENT:  Head: Normocephalic and atraumatic.    Eyes: Conjunctivae, EOM and lids are normal. Pupils are equal, round, and reactive to light.  Neck: Normal range of motion. Neck supple. No spinous process tenderness and no muscular tenderness present. No tracheal deviation present. No mass present.  Cardiovascular: Normal rate, regular rhythm and normal heart sounds.  Exam reveals no gallop.   No murmur heard. Pulmonary/Chest: Effort normal and breath sounds normal. No stridor. No respiratory distress. She has no decreased breath sounds. She has no wheezes. She has no rhonchi. She has no rales.  Abdominal: Soft. Normal appearance and bowel sounds are normal. She exhibits no distension. There is no tenderness. There is no rebound and no CVA tenderness.  Musculoskeletal: Normal range of motion. She exhibits no edema and no tenderness.  Neurological: She is alert and oriented to person, place, and time. She has normal strength. No cranial nerve deficit or sensory deficit. GCS eye subscore is 4. GCS verbal subscore is 5. GCS motor subscore is 6.  Skin: Skin is warm and dry. No abrasion and no rash noted.  Psychiatric: She has a normal mood and affect. Her speech is normal and behavior is normal.    ED Course  Procedures (including critical care time) Labs  Review Labs Reviewed  TROPONIN I  CBC WITH DIFFERENTIAL  COMPREHENSIVE METABOLIC PANEL    Imaging Review No results found.   EKG Interpretation None      MDM   Final diagnoses:  None    Pt given iv fluids and insulin, will be admitted for tx    Leota Jacobsen, MD 04/17/14 [phone]

## 2014-04-17 NOTE — H&P (Signed)
Patient Demographics  [name], is a 73 y.o. female  MRN: [id number]   DOB - Jan 21, 1941  Admit Date - 04/17/2014  Outpatient Primary MD for the patient is Shirline Frees, MD   With History of -  Past Medical History  Diagnosis Date  . Asthma   . Bronchitis   . Hernia   . Anemia   . Sarcoidosis   . Morbid obesity   . Sarcoid   . Dyslipidemia   . Cough   . Wheezing   . Chills   . Constipation   . Bruises easily   . Gout attack     ankle, then wrist and hands  . Hypertension   . Atrial fibrillation, persistent 11/01/2013  . Chronic anticoagulation, on Eliquis 11/01/2013  . History of nuclear stress test 02/09/2010    dipyridamole; normal pattern of perfusion in all regions with attenuation artifact in inferior region, low risk   . Fracture of left lower leg 06/1972    "no surgery; just casted it"  . CHF (congestive heart failure)   . Pneumonia 2012; 2014  . Chronic bronchitis   . OSA on CPAP   . Type II diabetes mellitus   . Uterine cancer     s/p hysterectomy  . Breast cancer, stage 1 10/17/2011    s/p right lumpectomy and XRT  . On home oxygen therapy     "2L prn" (01/24/2014)      Past Surgical History  Procedure Laterality Date  . Total knee arthroplasty Bilateral 03/2010; 06/2011    left; right  . Abdominal hysterectomy  02/04/2005  . Umbilical hernia repair  02/04/2005  . Transthoracic echocardiogram  07/2008    EF, LV size is normal; RVSP normal; mod calcif of MV apparatus  . Breast biopsy Right 01/2011  . Breast lumpectomy Right 01/2011  . Tubal ligation  1970's  . Hernia repair      in for   Chief Complaint  Patient presents with  . Fall  . Dizziness     HPI  [name]  is a 73 y.o. female, who presents with presyncope, the patient's report she stood up from a sitting  position, where she felt dizzy lightheaded, where she felt weak and fell on the floor, where she hit her face, fall was witnessed by her friends and a neighbor, she denies any loss of consciousness, and altered mental status, reports it was mainly due to to dizziness and lightheadedness, she denies any chest pain, any  shortness of breath, the patient was not orthostatic in ED, patient blood work was significant for hyperglycemia with blood sugar being 579, the patient was not in DKA, patient was recently started on large dose steroid into her ear issues and hearing loss by ENT physician, she's been on it for 5 days most recent dose is 60 mg oral daily, patient had mild leukocytosis at 13,000, but a febrile, denies any shortness of breath any cough any productive sputum any dysuria.    Review of Systems    In addition to the HPI above,  No Fever-chills, No Headache, No changes with Vision or hearing, No problems swallowing food or Liquids, No Chest pain, Cough or Shortness of Breath, No Abdominal pain, No Nausea or Vommitting, Bowel movements are regular, No Blood in stool or Urine, No dysuria, No new skin rashes or bruises, No new joints pains-aches,  No new weakness, tingling, numbness in any extremity, generalized weakness, and presyncopal episode. No recent weight gain or loss, No polyuria, polydypsia or polyphagia, No significant Mental Stressors.  A full 10 point Review of Systems was done, except as stated above, all other Review of Systems were negative.   Social History History  Substance Use Topics  . Smoking status: Never Smoker   . Smokeless tobacco: Never Used  . Alcohol Use: No    Family History Family History  Problem Relation Age of Onset  . Diabetes Mother   . Colon cancer Father   . Diabetes Brother   . Lung cancer Brother   . Diabetes Sister   . Cervical cancer Sister      Prior to Admission medications   Medication Sig Start Date End Date Taking?  Authorizing Provider  acetaminophen (TYLENOL) 500 MG tablet Take 1,000 mg by mouth every 6 (six) hours as needed. pain   Yes Historical Provider, MD  alendronate (FOSAMAX) 70 MG tablet Take 1 tablet (70 mg total) by mouth once a week. Take with a full glass of water on an empty stomach. 03/10/14  Yes Deatra Robinson, MD  allopurinol (ZYLOPRIM) 100 MG tablet Take 200 mg by mouth daily.  11/04/12  Yes Historical Provider, MD  apixaban (ELIQUIS) 5 MG TABS tablet Take 1 tablet (5 mg total) by mouth 2 (two) times daily. 01/03/14  Yes Pixie Casino, MD  Ascorbic Acid (VITAMIN C) 1000 MG tablet Take 1,000 mg by mouth daily.   Yes Historical Provider, MD  colchicine 0.6 MG tablet Take 1 tablet (0.6 mg total) by mouth 2 (two) times daily. 11/22/12  Yes [name], MD  diltiazem (CARDIZEM CD) 240 MG 24 hr capsule Take 240 mg by mouth every evening.  01/20/14  Yes Historical Provider, MD  exemestane (AROMASIN) 25 MG tablet Take 25 mg by mouth daily after breakfast.   Yes Historical Provider, MD  ferrous fumarate (HEMOCYTE - 106 MG FE) 325 (106 FE) MG TABS Take 1 tablet by mouth 2 (two) times daily.    Yes Historical Provider, MD  Fluticasone-Salmeterol (ADVAIR) 250-50 MCG/DOSE AEPB Inhale 1 puff into the lungs 2 (two) times daily.   Yes Historical Provider, MD  furosemide (LASIX) 80 MG tablet Take 80-160 mg by mouth 2 (two) times daily. 160mg  AM 80mg  PM 01/30/14  Yes Luke K Kilroy, PA-C  guaiFENesin (MUCINEX) 600 MG 12 hr tablet Take 1,200 mg by mouth 2 (two) times daily as needed for cough.    Yes Historical Provider, MD  HUMULIN N 100 UNIT/ML injection Inject 5 Units into  the skin as needed (5 units if blood sugar is greater than 150).  02/05/13  Yes Historical Provider, MD  insulin glargine (LANTUS) 100 UNIT/ML injection Inject 0.28 mLs (28 Units total) into the skin at bedtime. 05/14/13  Yes Jessica U Vann, DO  ipratropium (ATROVENT) 0.02 % nebulizer solution Take 2.5 mLs (0.5 mg total) by nebulization  every 6 (six) hours. 11/22/12  Yes [name], MD  levalbuterol (XOPENEX HFA) 45 MCG/ACT inhaler Inhale 2 puffs into the lungs every 6 (six) hours as needed for wheezing. 04/03/14  Yes Cecilie Kicks, NP  LORazepam (ATIVAN) 0.5 MG tablet Take one tablet by mouth every night at bedtime as needed for anxiety 11/07/13  Yes Theodis Blaze, MD  losartan (COZAAR) 50 MG tablet Take 1 tablet by mouth daily. 01/02/14  Yes Historical Provider, MD  meclizine (ANTIVERT) 25 MG tablet Take 25 mg by mouth 3 (three) times daily as needed for dizziness.    Yes Historical Provider, MD  metFORMIN (GLUCOPHAGE) 500 MG tablet Take 500 mg by mouth 2 (two) times daily with a meal. If cbg is greater than 120 pt takes medication   Yes Historical Provider, MD  metolazone (ZAROXOLYN) 5 MG tablet Take 0.5 tablets (2.5 mg total) by mouth daily. Take 30 minutes prior to AM dose of lasix 04/01/14  Yes Tarri Fuller, PA-C  Multiple Vitamin (MULTIVITAMIN WITH MINERALS) TABS Take 1 tablet by mouth daily.   Yes Historical Provider, MD  spironolactone (ALDACTONE) 25 MG tablet Take 0.5 tablets (12.5 mg total) by mouth 2 (two) times daily.   Yes Pixie Casino, MD  torsemide (DEMADEX) 20 MG tablet Take 2 tablets (40 mg total) by mouth 2 (two) times daily. 04/03/14  Yes Cecilie Kicks, NP    Allergies  Allergen Reactions  . Aspirin     Avoids due to being on blood thinners  . Contrast Media [Iodinated Diagnostic Agents] Nausea And Vomiting  . Ibuprofen     Avoids due to being on blood thinners  . Pravachol Other (See Comments)    Muscle pain  . Pravastatin Sodium     Physical Exam  Vitals  Blood pressure 113/59, pulse 88, temperature 97.6 F (36.4 C), temperature source Oral, resp. rate 26, weight 123.832 kg (273 lb), SpO2 95.00%.   1. General morbidly obese female lying in bed in NAD,    2. Normal affect and insight, Not Suicidal or Homicidal, Awake Alert, Oriented X 3.  3. No F.N deficits, ALL C.Nerves Intact, Strength  5/5 all 4 extremities, Sensation intact all 4 extremities, Plantars down going.  4. Ears and Eyes appear Normal, Conjunctivae clear, PERRLA. Moist Oral Mucosa.  5. Supple Neck, No JVD, No cervical lymphadenopathy appriciated, No Carotid Bruits.  6. Symmetrical Chest wall movement, Good air movement bilaterally, mild scattered wheezing.  7. irregular irregular, No Gallops, Rubs or Murmurs, No Parasternal Heave.  8. Positive Bowel Sounds, Abdomen Soft, Non tender, No organomegaly appriciated,No rebound -guarding or rigidity.  9.  No Cyanosis, Normal Skin Turgor, No Skin Rash or Bruise.  10. Good muscle tone,  joints appear normal , no effusions, Normal ROM.  11. No Palpable Lymph Nodes in Neck or Axillae    Data Review  CBC  Recent Labs Lab 04/17/14 1747  WBC 13.0*  HGB 15.8*  HCT 46.0  PLT 202  MCV 84.9  MCH 29.2  MCHC 34.3  RDW 14.0  LYMPHSABS 0.9  MONOABS 0.6  EOSABS 0.0  BASOSABS 0.0   ------------------------------------------------------------------------------------------------------------------  Chemistries   Recent Labs Lab 04/17/14 1747  NA 132*  K 5.4*  CL 90*  CO2 29  GLUCOSE 579*  BUN 51*  CREATININE 0.95  CALCIUM 9.1  AST 17  ALT 24  ALKPHOS 245*  BILITOT 0.7   ------------------------------------------------------------------------------------------------------------------ CrCl is unknown because both a height and weight (above a minimum accepted value) are required for this calculation. ------------------------------------------------------------------------------------------------------------------ No results found for this basename: TSH, T4TOTAL, FREET3, T3FREE, THYROIDAB,  in the last 72 hours   Coagulation profile No results found for this basename: INR, PROTIME,  in the last 168 hours ------------------------------------------------------------------------------------------------------------------- No results found for this  basename: DDIMER,  in the last 72 hours -------------------------------------------------------------------------------------------------------------------  Cardiac Enzymes  Recent Labs Lab 04/17/14 1747  TROPONINI <0.30   ------------------------------------------------------------------------------------------------------------------ No components found with this basename: POCBNP,    ---------------------------------------------------------------------------------------------------------------  Urinalysis    Component Value Date/Time   COLORURINE YELLOW 10/31/2013 [place] 10/31/2013 2327   LABSPEC 1.022 10/31/2013 2327   PHURINE 5.5 10/31/2013 [place] 10/31/2013 [place] 10/31/2013 [place] 10/31/2013 [place] 10/31/2013 [place] 10/31/2013 2327   UROBILINOGEN 0.2 10/31/2013 2327   NITRITE NEGATIVE 10/31/2013 2327   LEUKOCYTESUR NEGATIVE 10/31/2013 2327    ----------------------------------------------------------------------------------------------------------------  Imaging results:   Dg Chest 2 View  03/25/2014   CLINICAL DATA:  Dyspnea. Cough. History of right-sided breast cancer and hypertension. History of sarcoidosis.  EXAM: CHEST  2 VIEW  COMPARISON:  10/31/13  FINDINGS: Hyperinflation. Midline trachea. Mild cardiomegaly. Mediastinal adenopathy detailed on prior chest CT is not readily apparent. Resolution of right-sided pleural effusion since 10/31/2013. No pneumothorax. Clearing of lung bases. Diffuse peribronchial thickening.  IMPRESSION: Clearing of right-sided airspace disease and right pleural effusion.  Mild cardiomegaly with nonspecific interstitial thickening.   Electronically Signed   By: Abigail Miyamoto M.D.   On: 03/25/2014 13:51   Ct Head Wo Contrast  04/17/2014   CLINICAL DATA:  Dizziness.  Fell.  EXAM: CT HEAD WITHOUT CONTRAST  TECHNIQUE: Contiguous axial  images were obtained from the base of the skull through the vertex without intravenous contrast.  COMPARISON:  Brain MRI 05/11/2012  FINDINGS: Mild age related cerebral atrophy, ventriculomegaly and periventricular white matter disease. Benign-appearing bilateral basal ganglia calcifications. No extra-axial fluid collections. No CT findings for acute intracranial hemorrhage or hemispheric infarction. No mass lesions. The brainstem and cerebellum appear normal.  No acute skull fractures identified. Hyperostosis frontalis interna is noted. The paranasal sinuses and mastoid air cells are clear. The globes are intact.  IMPRESSION: Age related cerebral atrophy, ventriculomegaly and periventricular white matter disease.  No acute intracranial findings or skull fracture.   Electronically Signed   By: Kalman Jewels M.D.   On: 04/17/2014 18:16        Assessment & Plan  Principal Problem:   Hyperglycemia Active Problems:   Chronic diastolic CHF (congestive heart failure)   Pre-syncope   Obstructive sleep apnea   Gout   COPD (chronic obstructive pulmonary disease)   Atrial fibrillation, persistent    1. Presyncope: Most likely related to her hyperglycemia, patient had recent echo with normal ejection fraction, denies any chest pain or any shortness of breath, patient will be hydrated gently, will check urine analysis, and chest x-ray to rule out infection being the source, will cycle cardiac enzymes as well. 2. Hyperglycemia: The patient is known to have history of diabetes mellitus, most likely uncontrolled  currently do to her steroid use, will aim for more rapid steroid taper, original plan was to last for 14 days, today she is on day #5, will taper over the next 5 days, will resume her back on Lantus, will give her IV fluids, will start her on insulin sliding scale, and continue her on metformin. 3. Hyperkalemia: Mild, will monitor, will hold losartan, will recheck BMP in a.m. 4. History of  diastolic CHF: Currently appears to be compensated, we'll hold oral diuresis, will resume her back on diuresis when her hyperglycemia resolves, will monitor closely as she is on IV fluids 5. History of COPD: Patient has mild wheezing, but no respiratory distress, will continue her on her home medication including Symbicort, and when necessary ipratropium and Xopenex, as well she is on a steroid taper which should assist with her COPD. 6. Obstructive sleep apnea: Continue her on CPAP 7. Persistent atrial fibrillation: Rate controlled, continue with Cardizem, and a request for anticoagulation 8. Gout: Continue with home medication including allopurinol and colchicine   DVT Prophylaxis  on a liquids  AM Labs Ordered, also please review Full Orders  Family Communication: Admission, patients condition and plan of care including tests being ordered have been discussed with the patient and son and daughter in law who indicate understanding and agree with the plan and Code Status.  Code Status full code, has no living will or healthcare power of attorney  Likely DC to  home  Condition GUARDED    Time spent in minutes : 72 minutes    Phillips Climes M.D on 04/17/2014 at 8:17 PM     [facility] Office  [phone]

## 2014-04-18 ENCOUNTER — Ambulatory Visit: Payer: Medicare Other | Admitting: Internal Medicine

## 2014-04-18 ENCOUNTER — Telehealth: Payer: Self-pay | Admitting: *Deleted

## 2014-04-18 ENCOUNTER — Observation Stay (HOSPITAL_COMMUNITY): Payer: Medicare Other

## 2014-04-18 DIAGNOSIS — E871 Hypo-osmolality and hyponatremia: Secondary | ICD-10-CM | POA: Diagnosis present

## 2014-04-18 DIAGNOSIS — E860 Dehydration: Secondary | ICD-10-CM | POA: Diagnosis present

## 2014-04-18 DIAGNOSIS — R0902 Hypoxemia: Secondary | ICD-10-CM

## 2014-04-18 DIAGNOSIS — R5383 Other fatigue: Secondary | ICD-10-CM

## 2014-04-18 DIAGNOSIS — Z7901 Long term (current) use of anticoagulants: Secondary | ICD-10-CM

## 2014-04-18 DIAGNOSIS — R609 Edema, unspecified: Secondary | ICD-10-CM

## 2014-04-18 DIAGNOSIS — R112 Nausea with vomiting, unspecified: Secondary | ICD-10-CM

## 2014-04-18 DIAGNOSIS — E875 Hyperkalemia: Secondary | ICD-10-CM | POA: Diagnosis present

## 2014-04-18 DIAGNOSIS — E119 Type 2 diabetes mellitus without complications: Secondary | ICD-10-CM

## 2014-04-18 DIAGNOSIS — R5381 Other malaise: Secondary | ICD-10-CM

## 2014-04-18 DIAGNOSIS — J9610 Chronic respiratory failure, unspecified whether with hypoxia or hypercapnia: Secondary | ICD-10-CM

## 2014-04-18 LAB — BASIC METABOLIC PANEL
BUN: 36 mg/dL — ABNORMAL HIGH (ref 6–23)
CO2: 31 mEq/L (ref 19–32)
Calcium: 8.9 mg/dL (ref 8.4–10.5)
Chloride: 98 mEq/L (ref 96–112)
Creatinine, Ser: 0.65 mg/dL (ref 0.50–1.10)
GFR calc Af Amer: >90 mL/min (ref >90)
GFR, EST NON AFRICAN AMERICAN: 85 mL/min — AB (ref >90)
GLUCOSE: 159 mg/dL — AB (ref 70–99)
Potassium: 4.4 mEq/L (ref 3.7–5.3)
SODIUM: 138 meq/L (ref 137–147)

## 2014-04-18 LAB — GLUCOSE, CAPILLARY
GLUCOSE-CAPILLARY: 162 mg/dL — AB (ref 70–99)
Glucose-Capillary: 142 mg/dL — ABNORMAL HIGH (ref 70–99)
Glucose-Capillary: 354 mg/dL — ABNORMAL HIGH (ref 70–99)
Glucose-Capillary: 356 mg/dL — ABNORMAL HIGH (ref 70–99)

## 2014-04-18 LAB — HEMOGLOBIN A1C
Hgb A1c MFr Bld: 9.6 % — ABNORMAL HIGH (ref <5.7)
Mean Plasma Glucose: 229 mg/dL — ABNORMAL HIGH (ref <117)

## 2014-04-18 LAB — CBC
HCT: 44.2 % (ref 36.0–46.0)
HEMOGLOBIN: 15 g/dL (ref 12.0–15.0)
MCH: 28.8 pg (ref 26.0–34.0)
MCHC: 33.9 g/dL (ref 30.0–36.0)
MCV: 84.8 fL (ref 78.0–100.0)
Platelets: 166 10*3/uL (ref 150–400)
RBC: 5.21 MIL/uL — ABNORMAL HIGH (ref 3.87–5.11)
RDW: 14.1 % (ref 11.5–15.5)
WBC: 12.9 10*3/uL — AB (ref 4.0–10.5)

## 2014-04-18 LAB — TROPONIN I

## 2014-04-18 MED ORDER — LORAZEPAM 2 MG/ML IJ SOLN
1.0000 mg | Freq: Once | INTRAMUSCULAR | Status: AC
  Administered 2014-04-18: 1 mg via INTRAVENOUS
  Filled 2014-04-18: qty 1

## 2014-04-18 MED ORDER — INSULIN ASPART 100 UNIT/ML ~~LOC~~ SOLN: 0.0000 [IU] | Freq: Three times a day (TID) | SUBCUTANEOUS | Status: DC

## 2014-04-18 MED ORDER — PROMETHAZINE HCL 25 MG/ML IJ SOLN
12.5000 mg | INTRAMUSCULAR | Status: DC | PRN
  Administered 2014-04-18: 12.5 mg via INTRAVENOUS
  Filled 2014-04-18: qty 1

## 2014-04-18 MED ORDER — INSULIN ASPART 100 UNIT/ML ~~LOC~~ SOLN
0.0000 [IU] | Freq: Every day | SUBCUTANEOUS | Status: DC
Start: 2014-04-18 — End: 2014-04-19
  Administered 2014-04-18: 5 [IU] via SUBCUTANEOUS

## 2014-04-18 NOTE — Progress Notes (Signed)
Pt's HS CBG was 356. Pt to receive 28 units of lantus. No nighttime insulin coverage ordered. NP on call Rogue Bussing paged and orders received for HS sliding scale. This RN to continue to monitor.

## 2014-04-18 NOTE — Progress Notes (Signed)
UR completed 

## 2014-04-18 NOTE — Progress Notes (Signed)
Progress Note   [name] QVZ:[id number] DOB: 12/27/1940 DOA: 04/17/2014 PCP: Shirline Frees, MD   Brief Narrative:   [name] is an 73 y.o. female with a PMH of asthma, sarcoidosis, morbid obesity, hypertension, atrial fibrillation on chronic anticoagulation, chronic respiratory failure on 2 L of home oxygen therapy, obstructive sleep apnea on CPAP, type 2 diabetes, stage I breast cancer status post right lumpectomy/radiation treatment, recent evaluation by ENT for hearing loss managed with 60 mg of prednisone daily who was admitted 04/17/14 with a chief complaint of positional syncope/fall to the floor but without loss of consciousness or altered mental status. Upon initial evaluation in the ED, the patient was normotensive, blood glucose 579 without evidence of DKA.  Assessment/Plan:   Principal Problem:   Pre-syncope / dehydration / vertigo / hearing loss  Troponins negative x3.  Chest x-ray negative for active cardiopulmonary disease with incidental possible nodular opacity in the right lower lung.  Urinalysis negative for nitrites and leukocytes with a few bacteria noted on microscopy.  CT of the head negative for acute findings.  Gently hydrated overnight, presyncope likely from dehydration secondary to osmotic diuresis in the setting of uncontrolled hyperglycemia. BUN improving with IV fluids.  Given recent evaluation by ENT and initiation of steroids for hearing loss, with new onset vertigo, will obtain MRI of the brain. Active Problems:   History of breast cancer / lung nodule  Continue Aromasin.  Nodule noted on chest x-ray has been evaluated in the past the CT scan 09/26/13 of the chest showing stable small bilateral pulmonary nodules from old granulomatous disease or sarcoidosis.   Chronic respiratory failure / Obstructive sleep apnea  Continue supplemental oxygen and CPAP each bedtime.   Gout  Continue allopurinol and colchicine.   COPD (chronic  obstructive pulmonary disease)  Continue Symbicort and when necessary bronchodilators.   Atrial fibrillation, persistent  Rate controlled on Cardizem. Anticoagulated with Elliquis.   Chronic diastolic CHF (congestive heart failure)  Appears to be compensated. Diuretics held secondary to dehydration from hyperglycemia with presumed osmotic diuresis.   Hyperglycemia / type 2 diabetes  Glycemic control affected by recent steroid use.  Currently being managed with metformin, Lantus 28 units daily and moderate scale SSI.  Check hemoglobin A1c. Last documented A1c 7.3% 05/06/13.   DVT Prophylaxis  Anticoagulated on Eliquis.  Code Status: Full. Family Communication: No family currently at the bedside. Disposition Plan: Home when stable.   IV Access:    Peripheral IV   Procedures:    None.   Medical Consultants:    None.   Other Consultants:    Physical therapy   Anti-Infectives:    None.  Subjective:   [name] complains of ongoing problems with nausea despite anti-emetics and vertigo. No complaints of pain. No vomiting. Breathing is at usual baseline.  Objective:    Filed Vitals:   04/17/14 2243 04/17/14 2250 04/18/14 0308 04/18/14 0442  BP:    119/57  Pulse:   78 69  Temp:    98 F (36.7 C)  TempSrc:    Oral  Resp:   20 22  Height:      Weight:      SpO2: 99% 99%  100%    Intake/Output Summary (Last 24 hours) at 04/18/14 0734 Last data filed at 04/18/14 0443  Gross per 24 hour  Intake    120 ml  Output      0 ml  Net    120  ml    Exam: Gen:  Ill appearing Cardiovascular:  HSIR, No M/R/G Respiratory:  Lungs CTAB Gastrointestinal:  Abdomen soft, NT/ND, + BS Extremities:  No C/E/C, varicose veins   Data Reviewed:    Labs: Basic Metabolic Panel:  Recent Labs Lab 04/17/14 1747 04/18/14 0626  NA 132* 138  K 5.4* 4.4  CL 90* 98  CO2 29 31  GLUCOSE 579* 159*  BUN 51* 36*  CREATININE 0.95 0.65  CALCIUM 9.1 8.9    GFR Estimated Creatinine Clearance: 73.4 ml/min (by C-G formula based on Cr of 0.65). Liver Function Tests:  Recent Labs Lab 04/17/14 1747  AST 17  ALT 24  ALKPHOS 245*  BILITOT 0.7  PROT 7.2  ALBUMIN 3.9   CBC:  Recent Labs Lab 04/17/14 1747 04/18/14 0626  WBC 13.0* 12.9*  NEUTROABS 11.5*  --   HGB 15.8* 15.0  HCT 46.0 44.2  MCV 84.9 84.8  PLT 202 166   Cardiac Enzymes:  Recent Labs Lab 04/17/14 1747 04/18/14 04/18/14 0626  TROPONINI <0.30 <0.30 <0.30   BNP (last 3 results)  Recent Labs  01/24/14 1945 01/30/14 0523 03/20/14 1537  PROBNP 665.4* 404.5* 1011.00*   CBG:  Recent Labs Lab 04/17/14 2217  GLUCAP 265*   Sepsis Labs:  Recent Labs Lab 04/17/14 1747 04/18/14 0626  WBC 13.0* 12.9*   Microbiology No results found for this or any previous visit (from the past 240 hour(s)).   Radiographs/Studies:   Dg Chest 2 View  04/17/2014   CLINICAL DATA:  Shortness of breath, cough  EXAM: CHEST  2 VIEW  COMPARISON:  DG CHEST 2 VIEW dated 03/25/2014; DG CHEST 2 VIEW dated 06/06/2013; CT ANGIO CHEST W/CM &/OR WO/CM dated 05/11/2013  FINDINGS: There is a nodular opacity projected over the right lower lung measuring 15 mm not delineated on the lateral view. There is mild nonspecific bilateral interstitial thickening, likely chronic. There is no focal parenchymal opacity, pleural effusion, or pneumothorax. The heart and mediastinal contours are unremarkable.  There is mild thoracic spine spondylosis.  IMPRESSION: No active cardiopulmonary disease.  There is a nodular opacity projected over the right lower lung measuring 15 mm not delineated on the lateral view. This may reflect a small pulmonary nodule versus artifact which is superficial to the patient. Follow up radiography is recommended.   Electronically Signed   By: Kathreen Devoid   On: 04/17/2014 20:42   Ct Head Wo Contrast  04/17/2014   CLINICAL DATA:  Dizziness.  Fell.  EXAM: CT HEAD WITHOUT CONTRAST   TECHNIQUE: Contiguous axial images were obtained from the base of the skull through the vertex without intravenous contrast.  COMPARISON:  Brain MRI 05/11/2012  FINDINGS: Mild age related cerebral atrophy, ventriculomegaly and periventricular white matter disease. Benign-appearing bilateral basal ganglia calcifications. No extra-axial fluid collections. No CT findings for acute intracranial hemorrhage or hemispheric infarction. No mass lesions. The brainstem and cerebellum appear normal.  No acute skull fractures identified. Hyperostosis frontalis interna is noted. The paranasal sinuses and mastoid air cells are clear. The globes are intact.  IMPRESSION: Age related cerebral atrophy, ventriculomegaly and periventricular white matter disease.  No acute intracranial findings or skull fracture.   Electronically Signed   By: Kalman Jewels M.D.   On: 04/17/2014 18:16    Medications:   . sodium chloride   Intravenous STAT  . allopurinol  200 mg Oral Daily  . apixaban  5 mg Oral BID  . colchicine  0.6 mg Oral BID  .  diltiazem  240 mg Oral QPM  . docusate sodium  100 mg Oral BID  . exemestane  25 mg Oral QPC breakfast  . ferrous fumarate  1 tablet Oral BID  . insulin aspart  0-15 Units Subcutaneous TID WC  . insulin aspart  15 Units Subcutaneous Once  . insulin glargine  28 Units Subcutaneous QHS  . ipratropium  0.5 mg Nebulization TID  . levalbuterol  0.63 mg Nebulization TID  . metFORMIN  500 mg Oral BID WC  . mometasone-formoterol  2 puff Inhalation BID  . multivitamin with minerals  1 tablet Oral Daily  . predniSONE  30 mg Oral Q breakfast   Followed by  . [START ON 04/19/2014] predniSONE  20 mg Oral Q breakfast   Followed by  . [START ON 04/20/2014] predniSONE  15 mg Oral Q breakfast   Followed by  . [START ON 04/21/2014] predniSONE  10 mg Oral Q breakfast   Followed by  . [START ON 04/22/2014] predniSONE  5 mg Oral Q breakfast  . senna  1 tablet Oral BID  . vitamin C  1,000 mg Oral Daily    Continuous Infusions: . sodium chloride 100 mL/hr at 04/17/14 2307    Time spent: 35 minutes with > 50% of time discussing current diagnostic test results, clinical impression and plan of care.    LOS: 1 day   [place]  [facility] Pager [phone]. If unable to reach me by pager, please call my cell phone at [phone].  *Please refer to [email or web address], password [email or web address] to get updated schedule on who will round on this patient, as hospitalists switch teams weekly. If 7PM-7AM, please contact night-coverage at [email or web address], password [email or web address] for any overnight needs.  04/18/2014, 7:34 AM    **Disclaimer: This note was dictated with voice recognition software. Similar sounding words can inadvertently be transcribed and this note may contain transcription errors which may not have been corrected upon publication of note.**

## 2014-04-18 NOTE — Telephone Encounter (Signed)
Faxed [place] certification and plan of care, addendum to plan of treatment

## 2014-04-18 NOTE — Evaluation (Signed)
Physical Therapy Evaluation Patient Details Name: COLEEN CARDIFF MRN: [id number] DOB: 10/16/1941 Today's Date: 04/18/2014   History of Present Illness  Pt admit with CHF and pre-syncope; PMX: HTN, O2 dependent, morbid obesity  Clinical Impression  Pt will benefit form PT to address deficits below; May need SNF vs HHPT depending on progress    Follow Up Recommendations SNF (if progresses well  may be able to go home with HHPT)    Equipment Recommendations  None recommended by PT    Recommendations for Other Services       Precautions / Restrictions Precautions Precautions: Fall Restrictions Weight Bearing Restrictions: No      Mobility  Bed Mobility               General bed mobility comments: to EOB with Nurse tech  Transfers Overall transfer level: Needs assistance Equipment used: Rolling walker (2 wheeled) Transfers: Sit to/from [facility] Sit to Stand: +2 safety/equipment;Min assist Stand pivot transfers: +2 safety/equipment;Mod assist       General transfer comment: 2 standing trials and 2 attempts to complete pivot; pt felt she was falling but no LOB, possibly due to dizziness;  BPs checked with nursing and pt not orthostatic  Ambulation/Gait                Stairs            Wheelchair Mobility    Modified Rankin (Stroke Patients Only)       Balance Overall balance assessment: Needs assistance   Sitting balance-Leahy Scale: Fair Sitting balance - Comments: not tested further due to pt nauseous/dizzy     Standing balance-Leahy Scale: Poor                               Pertinent Vitals/Pain No c/o pain    Home Living Family/patient expects to be discharged to:: Private residence Living Arrangements: Alone   Type of Home: Apartment Home Access: Level entry     Home Layout: One level Home Equipment: [place] - 4 wheels;Tub bench;Grab bars - toilet;Grab bars - tub/shower;Bedside commode       Prior Function Level of Independence: Needs assistance      ADL's / Homemaking Assistance Needed: son does grocery shopping; 1x/month housekeeper  Comments: chronic 2L O2; uses 4 wheel RW at all times     Hand Dominance   Dominant Hand: Right    Extremity/Trunk Assessment   Upper Extremity Assessment: Defer to OT evaluation           Lower Extremity Assessment: Generalized weakness         Communication   Communication: No difficulties  Cognition Arousal/Alertness: Awake/alert Behavior During Therapy: WFL for tasks assessed/performed Overall Cognitive Status: Within Functional Limits for tasks assessed                      General Comments      Exercises        Assessment/Plan    PT Assessment Patient needs continued PT services  PT Diagnosis     PT Problem List Decreased strength;Decreased activity tolerance;Decreased balance;Decreased mobility;Decreased knowledge of precautions;Decreased knowledge of use of DME;Obesity  PT Treatment Interventions DME instruction;Gait training;Functional mobility training;Therapeutic activities;Therapeutic exercise;Patient/family education   PT Goals (Current goals can be found in the Care Plan section) Acute Rehab PT Goals Patient Stated Goal: I PT Goal Formulation: With patient Time For Goal Achievement: 04/18/14  Potential to Achieve Goals: Good    Frequency Min 3X/week   Barriers to discharge        Co-evaluation               End of Session Equipment Utilized During Treatment: Gait belt Activity Tolerance: Patient limited by fatigue;Treatment limited secondary to medical complications (Comment) (nausea and dizziness) Patient left:  (3in1 with NT) Nurse Communication: Mobility status    Functional Assessment Tool Used: clincal observation Functional Limitation: Mobility: Walking and moving around Mobility: Walking and Moving Around Current Status ([id number]): At least 40 percent but less than 60  percent impaired, limited or restricted Mobility: Walking and Moving Around Goal Status [phone]): At least 20 percent but less than 40 percent impaired, limited or restricted    Time: 0942-0958 PT Time Calculation (min): 16 min   Charges:   PT Evaluation $Initial PT Evaluation Tier I: 1 Procedure PT Treatments $Therapeutic Activity: 8-22 mins   PT G Codes:   Functional Assessment Tool Used: clincal observation Functional Limitation: Mobility: Walking and moving around    [employer] 04/18/2014, 11:10 AM

## 2014-04-18 NOTE — Progress Notes (Signed)
Patient went to MRI this afternoon,but according to the technician procedure was not done because of the patients built,they tried to fit her in the scanner but it did not work,Dr. Rockne Menghini text paged re: this matter. Patient resting in bed.- Sharon Ano RN

## 2014-04-18 NOTE — Telephone Encounter (Signed)
Encounter Closed---5/8 TP 

## 2014-04-18 NOTE — Progress Notes (Signed)
Assistance offered with nocturnal CPAP at approximately 11pm. She declined and asked that RT return around 1am. Equipment set up and available at bedside with pressure settings 9cmH2O, humidified with nasal mask. She continues to decline assistance at this time, and asks that RT return "in a little while". Will continue to follow.

## 2014-04-19 DIAGNOSIS — E860 Dehydration: Secondary | ICD-10-CM

## 2014-04-19 DIAGNOSIS — R42 Dizziness and giddiness: Secondary | ICD-10-CM | POA: Diagnosis present

## 2014-04-19 LAB — URINE CULTURE: Colony Count: >=100000

## 2014-04-19 LAB — GLUCOSE, CAPILLARY
GLUCOSE-CAPILLARY: 150 mg/dL — AB (ref 70–99)
GLUCOSE-CAPILLARY: 179 mg/dL — AB (ref 70–99)
GLUCOSE-CAPILLARY: 256 mg/dL — AB (ref 70–99)
GLUCOSE-CAPILLARY: 315 mg/dL — AB (ref 70–99)

## 2014-04-19 MED ORDER — INSULIN ASPART 100 UNIT/ML ~~LOC~~ SOLN
0.0000 [IU] | Freq: Every day | SUBCUTANEOUS | Status: DC
  Administered 2014-04-19: 4 [IU] via SUBCUTANEOUS
  Administered 2014-04-22: 2 [IU] via SUBCUTANEOUS

## 2014-04-19 MED ORDER — LEVALBUTEROL HCL 0.63 MG/3ML IN NEBU
0.6300 mg | INHALATION_SOLUTION | Freq: Two times a day (BID) | RESPIRATORY_TRACT | Status: DC
  Administered 2014-04-20 – 2014-04-23 (×7): 0.63 mg via RESPIRATORY_TRACT
  Filled 2014-04-19 (×14): qty 3

## 2014-04-19 MED ORDER — CIPROFLOXACIN IN D5W 400 MG/200ML IV SOLN
400.0000 mg | Freq: Two times a day (BID) | INTRAVENOUS | Status: DC
  Administered 2014-04-19 – 2014-04-23 (×9): 400 mg via INTRAVENOUS
  Filled 2014-04-19 (×10): qty 200

## 2014-04-19 MED ORDER — INSULIN ASPART 100 UNIT/ML ~~LOC~~ SOLN
6.0000 [IU] | Freq: Three times a day (TID) | SUBCUTANEOUS | Status: DC
  Administered 2014-04-19 (×3): 6 [IU] via SUBCUTANEOUS

## 2014-04-19 MED ORDER — LIVING WELL WITH DIABETES BOOK
Freq: Once | Status: AC
  Administered 2014-04-19: 12:00:00
  Filled 2014-04-19: qty 1

## 2014-04-19 MED ORDER — IPRATROPIUM BROMIDE 0.02 % IN SOLN
0.5000 mg | Freq: Two times a day (BID) | RESPIRATORY_TRACT | Status: DC
  Administered 2014-04-20 – 2014-04-23 (×7): 0.5 mg via RESPIRATORY_TRACT
  Filled 2014-04-19 (×7): qty 2.5

## 2014-04-19 MED ORDER — NYSTATIN 100000 UNIT/GM EX POWD
Freq: Three times a day (TID) | CUTANEOUS | Status: DC
  Administered 2014-04-19 – 2014-04-22 (×9): via TOPICAL
  Filled 2014-04-19: qty 15

## 2014-04-19 MED ORDER — INSULIN ASPART 100 UNIT/ML ~~LOC~~ SOLN
0.0000 [IU] | Freq: Three times a day (TID) | SUBCUTANEOUS | Status: DC
  Administered 2014-04-19: 11 [IU] via SUBCUTANEOUS
  Administered 2014-04-19: 4 [IU] via SUBCUTANEOUS
  Administered 2014-04-19: 3 [IU] via SUBCUTANEOUS
  Administered 2014-04-20: 4 [IU] via SUBCUTANEOUS
  Administered 2014-04-20: 11 [IU] via SUBCUTANEOUS
  Administered 2014-04-20: 7 [IU] via SUBCUTANEOUS
  Administered 2014-04-21 – 2014-04-23 (×4): 4 [IU] via SUBCUTANEOUS

## 2014-04-19 NOTE — Progress Notes (Addendum)
Progress Note   [name] [id number] DOB: 20-Sep-1941 DOA: 04/17/2014 PCP: Shirline Frees, MD   Brief Narrative:   [name] is an 73 y.o. female with a PMH of asthma, sarcoidosis, morbid obesity, hypertension, atrial fibrillation on chronic anticoagulation, chronic respiratory failure on 2 L of home oxygen therapy, obstructive sleep apnea on CPAP, type 2 diabetes, stage I breast cancer status post right lumpectomy/radiation treatment, recent evaluation by ENT for hearing loss managed with 60 mg of prednisone daily who was admitted 04/17/14 with a chief complaint of positional syncope/fall to the floor but without loss of consciousness or altered mental status. Upon initial evaluation in the ED, the patient was normotensive, blood glucose 579 without evidence of DKA.  Assessment/Plan:   Principal Problem:   Pre-syncope / dehydration / vertigo / hearing loss  Troponins negative x3.  Chest x-ray negative for active cardiopulmonary disease with incidental possible nodular opacity in the right lower lung.  Urinalysis negative for nitrites and leukocytes with a few bacteria noted on microscopy. Urine cultures grew multiple bacterial morphotypes, greater than 100,000 colonies, so will treat with Cipro.  CT of the head negative for acute findings. Unable to do MRI as the patient could not fit in the scanner. We'll try to get MRI open scanner at [facility].  Gently hydrated overnight, presyncope likely from dehydration secondary to osmotic diuresis in the setting of uncontrolled hyperglycemia. BUN improved with IV fluids.  Vestibular PT/OT evaluation requested. Active Problems:   History of breast cancer / lung nodule  Continue Aromasin.  Nodule noted on chest x-ray has been evaluated in the past the CT scan 09/26/13 of the chest showing stable small bilateral pulmonary nodules from old granulomatous disease or sarcoidosis.   Chronic respiratory failure / Obstructive sleep  apnea  Continue supplemental oxygen and CPAP each bedtime.   Gout  Continue allopurinol and colchicine.   COPD (chronic obstructive pulmonary disease)  Continue Symbicort and when necessary bronchodilators.   Atrial fibrillation, persistent  Rate controlled on Cardizem. Anticoagulated with Elliquis.   Chronic diastolic CHF (congestive heart failure)  Appears to be compensated. Diuretics held secondary to dehydration from hyperglycemia with presumed osmotic diuresis.   Hyperglycemia / type 2 diabetes  Glycemic control affected by recent steroid use.  Currently being managed with metformin, Lantus 28 units daily and moderate scale SSI. CBGs 142-356. We'll change to insulin resistance sliding scale and added meal coverage.  Hemoglobin A1c 9.6 corresponding to a mean plasma glucose of 229. Diabetes coordinator consult for uncontrolled diabetes, which is likely being exacerbated by steroids.   DVT Prophylaxis  Anticoagulated on Eliquis.  Code Status: Full. Family Communication: No family currently at the bedside.  Updated son, [name], by telephone at [phone]. Disposition Plan: Home when stable.   IV Access:    Peripheral IV   Procedures:    None.   Medical Consultants:    None.   Other Consultants:    Physical therapy  Diabetes coordinator   Anti-Infectives:    None.  Subjective:   [name] had some nausea and vomiting yesterday, but ate breakfast this morning without difficulty and feels a bit better. Says the vertigo was a little bit better as well. Bowels moved yesterday. No dyspnea or cough.  Objective:    Filed Vitals:   04/18/14 1624 04/18/14 1729 04/18/14 2119 04/19/14 0639  BP:  121/63 106/57 130/98  Pulse:   80 66  Temp:   98 F (36.7 C) 97.8  F (36.6 C)  TempSrc:   Oral Oral  Resp:   20 18  Height:      Weight:      SpO2: 97%  96% 97%    Intake/Output Summary (Last 24 hours) at 04/19/14 0730 Last data filed at  04/19/14 0037  Gross per 24 hour  Intake 1748.34 ml  Output      0 ml  Net 1748.34 ml    Exam: Gen:  NAD Cardiovascular:  HSIR, No M/R/G Respiratory:  Lungs CTAB Gastrointestinal:  Abdomen soft, NT/ND, + BS Extremities:  No C/E/C, varicose veins   Data Reviewed:    Labs: Basic Metabolic Panel:  Recent Labs Lab 04/17/14 1747 04/18/14 0626  NA 132* 138  K 5.4* 4.4  CL 90* 98  CO2 29 31  GLUCOSE 579* 159*  BUN 51* 36*  CREATININE 0.95 0.65  CALCIUM 9.1 8.9   GFR Estimated Creatinine Clearance: 73.4 ml/min (by C-G formula based on Cr of 0.65). Liver Function Tests:  Recent Labs Lab 04/17/14 1747  AST 17  ALT 24  ALKPHOS 245*  BILITOT 0.7  PROT 7.2  ALBUMIN 3.9   CBC:  Recent Labs Lab 04/17/14 1747 04/18/14 0626  WBC 13.0* 12.9*  NEUTROABS 11.5*  --   HGB 15.8* 15.0  HCT 46.0 44.2  MCV 84.9 84.8  PLT 202 166   Cardiac Enzymes:  Recent Labs Lab 04/17/14 1747 04/18/14 04/18/14 0626 04/18/14 1214  TROPONINI <0.30 <0.30 <0.30 <0.30   BNP (last 3 results)  Recent Labs  01/24/14 1945 01/30/14 0523 03/20/14 1537  PROBNP 665.4* 404.5* 1011.00*   CBG:  Recent Labs Lab 04/18/14 0803 04/18/14 1159 04/18/14 1702 04/18/14 2105 04/19/14 0716  GLUCAP 162* 142* 354* 356* 150*   Sepsis Labs:  Recent Labs Lab 04/17/14 1747 04/18/14 0626  WBC 13.0* 12.9*   Microbiology Recent Results (from the past 240 hour(s))  URINE CULTURE     Status: None   Collection Time    04/17/14  8:57 PM      Result Value Ref Range Status   Specimen Description URINE, CLEAN CATCH   Final   Special Requests NONE   Final   Culture  Setup Time     Final   Value: 04/18/2014 01:52     Performed at [facility]     Final   Value: >=100,000 COLONIES/ML     Performed at [facility]   Culture     Final   Value: Multiple bacterial morphotypes present, none predominant. Suggest appropriate recollection if clinically indicated.      Performed at [facility]   Report Status 04/19/2014 FINAL   Final     Radiographs/Studies:   Dg Chest 2 View  04/17/2014   CLINICAL DATA:  Shortness of breath, cough  EXAM: CHEST  2 VIEW  COMPARISON:  DG CHEST 2 VIEW dated 03/25/2014; DG CHEST 2 VIEW dated 06/06/2013; CT ANGIO CHEST W/CM &/OR WO/CM dated 05/11/2013  FINDINGS: There is a nodular opacity projected over the right lower lung measuring 15 mm not delineated on the lateral view. There is mild nonspecific bilateral interstitial thickening, likely chronic. There is no focal parenchymal opacity, pleural effusion, or pneumothorax. The heart and mediastinal contours are unremarkable.  There is mild thoracic spine spondylosis.  IMPRESSION: No active cardiopulmonary disease.  There is a nodular opacity projected over the right lower lung measuring 15 mm not delineated on the lateral view. This may reflect a  small pulmonary nodule versus artifact which is superficial to the patient. Follow up radiography is recommended.   Electronically Signed   By: Kathreen Devoid   On: 04/17/2014 20:42   Ct Head Wo Contrast  04/17/2014   CLINICAL DATA:  Dizziness.  Fell.  EXAM: CT HEAD WITHOUT CONTRAST  TECHNIQUE: Contiguous axial images were obtained from the base of the skull through the vertex without intravenous contrast.  COMPARISON:  Brain MRI 05/11/2012  FINDINGS: Mild age related cerebral atrophy, ventriculomegaly and periventricular white matter disease. Benign-appearing bilateral basal ganglia calcifications. No extra-axial fluid collections. No CT findings for acute intracranial hemorrhage or hemispheric infarction. No mass lesions. The brainstem and cerebellum appear normal.  No acute skull fractures identified. Hyperostosis frontalis interna is noted. The paranasal sinuses and mastoid air cells are clear. The globes are intact.  IMPRESSION: Age related cerebral atrophy, ventriculomegaly and periventricular white matter disease.  No acute intracranial  findings or skull fracture.   Electronically Signed   By: Kalman Jewels M.D.   On: 04/17/2014 18:16    Medications:   . allopurinol  200 mg Oral Daily  . apixaban  5 mg Oral BID  . colchicine  0.6 mg Oral BID  . diltiazem  240 mg Oral QPM  . docusate sodium  100 mg Oral BID  . exemestane  25 mg Oral QPC breakfast  . ferrous fumarate  1 tablet Oral BID  . insulin aspart  0-15 Units Subcutaneous TID WC  . insulin aspart  0-5 Units Subcutaneous QHS  . insulin aspart  15 Units Subcutaneous Once  . insulin glargine  28 Units Subcutaneous QHS  . ipratropium  0.5 mg Nebulization TID  . levalbuterol  0.63 mg Nebulization TID  . metFORMIN  500 mg Oral BID WC  . mometasone-formoterol  2 puff Inhalation BID  . multivitamin with minerals  1 tablet Oral Daily  . predniSONE  20 mg Oral Q breakfast   Followed by  . [START ON 04/20/2014] predniSONE  15 mg Oral Q breakfast   Followed by  . [START ON 04/21/2014] predniSONE  10 mg Oral Q breakfast   Followed by  . [START ON 04/22/2014] predniSONE  5 mg Oral Q breakfast  . senna  1 tablet Oral BID  . vitamin C  1,000 mg Oral Daily   Continuous Infusions:    Time spent: 25 minutes.    LOS: 2 days   [place]  [facility] Pager [phone]. If unable to reach me by pager, please call my cell phone at [phone].  *Please refer to [email or web address], password [email or web address] to get updated schedule on who will round on this patient, as hospitalists switch teams weekly. If 7PM-7AM, please contact night-coverage at [email or web address], password [email or web address] for any overnight needs.  04/19/2014, 7:30 AM    **Disclaimer: This note was dictated with voice recognition software. Similar sounding words can inadvertently be transcribed and this note may contain transcription errors which may not have been corrected upon publication of note.**   Information printed out and reviewed with the patient/family:     In an effort to keep you and your family informed about your  hospital stay, I am providing you with this information sheet. If you or your family have any questions, please do not hesitate to have the nursing staff page me to set up a meeting time.  Also note that the hospitalist doctors typically change on Tuesdays or Wednesdays to a different hospitalist doctor.  [name] 04/19/2014  2 (Number of days in the hospital)  Treatment team:  Dr. Jacquelynn Cree, Hospitalist (Internist)  Active Treatment Issues with Plan: Principal Problem:   Dizziness, vertigo, recent evaluation for hearing loss  CT of the head negative for acute findings.  We'll [facility] to get MRI open scanner at [facility].  You were also dehydrated on admission, so we will continue IV fluids, the dehydration is improving. Active Problems:   History of breast cancer  Continue Aromasin.   Chronic respiratory failure / Obstructive sleep apnea  Continue supplemental oxygen and CPAP at bedtime.   Gout  Continue allopurinol and colchicine.   COPD (chronic obstructive pulmonary disease)  Continue Symbicort and when necessary bronchodilators.   Atrial fibrillation  Heart rate controlled on Cardizem. Anticoagulated with Elliquis.   High blood sugar/ type 2 diabetes  Blood sugars likely being affected by steroids, which can cause high blood sugars.  Currently being managed with metformin, Lantus 28 units daily and insulin. Your sugars have ranged 142-356. We'll increase her insulin coverage to get better control of your sugars.  Hemoglobin A1c 9.6 corresponding to an average glucose of 229 over the past 3 months.   We will have the diabetes coordinator come and talk with you about your diabetes control.  Anticipated discharge date: Depends on when your symptoms improve.

## 2014-04-19 NOTE — Progress Notes (Signed)
PT Cancellation Note  Patient Details Name: [name] MRN: [id number] DOB: 21-Aug-1941   Cancelled Treatment:    Reason Eval/Treat Not Completed: Other (comment) (vestibular therapist will see tomorrow)   Neil Crouch 04/19/2014, 1:20 PM

## 2014-04-19 NOTE — Progress Notes (Signed)
Spoke with Pt in regards to CPAP, pt stated that she wanted RT to come back around midnight to help her get the CPAP mask on.  RT to monitor and assess as needed.

## 2014-04-19 NOTE — Progress Notes (Addendum)
Spoke with patient on the phone about her diabetes.  She was diagnosed probably at least 10 years ago.  Sees Dr. Kenton Kingfisher as her PCP.  Was in his office about 2 months ago.  Has been on steroids for an ear issue for about 10 days.  This has caused her blood sugars to go up.She takes Lantus 28 units every HS, takes 5 units of another "kind of insulin" if her blood sugar is greater than 150 mg/dl, and Metformin when she needs it.  Checks blood sugars twice a day AM and HS. Does have a meter with strips and lancets.  Has recently increased Lantus to 35 units since her blood sugars have been elevated. States that her A1C is usually "6". Explained the elevated HgbA1C of 9.6% to he. rStates that she eats or "snacks" during the day; does not eat 3 meals.  Recommend that a dietician give her a meal plan, have her watch DM videos #501-510 while in the hospital, and have staff RNs check her on insulin administration and checking own CBGs. Will order Living Well with Diabetes booklet for her.  Current insulin orders with Novolog meal coverage should help postprandial blood sugars due to steroids. Patient eating 100% meals per staff RN.  Will follow while in hospital.  Harvel Ricks RN BSN CDE

## 2014-04-19 NOTE — Progress Notes (Signed)
Patient provided with living well with diabetes book and set up to watch video 501 in diabetes education network.  Post video pt stated did not wish to watch any more videos at this time, and will watch them later.  Pt reports she checks her own cbg's at home and is comfortable with the process, also self administers insulin at home using insulin pens. Does not have any questions at this time regarding these processes.  Will continue to monitor and teach patient.  Of note discharge plan at this time is SNF placement.

## 2014-04-20 DIAGNOSIS — B369 Superficial mycosis, unspecified: Secondary | ICD-10-CM

## 2014-04-20 DIAGNOSIS — D869 Sarcoidosis, unspecified: Secondary | ICD-10-CM

## 2014-04-20 LAB — CBC
HEMATOCRIT: 43 % (ref 36.0–46.0)
Hemoglobin: 14.1 g/dL (ref 12.0–15.0)
MCH: 28.4 pg (ref 26.0–34.0)
MCHC: 32.8 g/dL (ref 30.0–36.0)
MCV: 86.5 fL (ref 78.0–100.0)
Platelets: 149 10*3/uL — ABNORMAL LOW (ref 150–400)
RBC: 4.97 MIL/uL (ref 3.87–5.11)
RDW: 14.3 % (ref 11.5–15.5)
WBC: 12.7 10*3/uL — ABNORMAL HIGH (ref 4.0–10.5)

## 2014-04-20 LAB — GLUCOSE, CAPILLARY
GLUCOSE-CAPILLARY: 162 mg/dL — AB (ref 70–99)
GLUCOSE-CAPILLARY: 193 mg/dL — AB (ref 70–99)
Glucose-Capillary: 202 mg/dL — ABNORMAL HIGH (ref 70–99)
Glucose-Capillary: 256 mg/dL — ABNORMAL HIGH (ref 70–99)

## 2014-04-20 LAB — BASIC METABOLIC PANEL
BUN: 24 mg/dL — ABNORMAL HIGH (ref 6–23)
CO2: 32 mEq/L (ref 19–32)
Calcium: 8.8 mg/dL (ref 8.4–10.5)
Chloride: 95 mEq/L — ABNORMAL LOW (ref 96–112)
Creatinine, Ser: 0.68 mg/dL (ref 0.50–1.10)
GFR calc Af Amer: >90 mL/min (ref >90)
GFR, EST NON AFRICAN AMERICAN: 84 mL/min — AB (ref >90)
GLUCOSE: 178 mg/dL — AB (ref 70–99)
Potassium: 4.7 mEq/L (ref 3.7–5.3)
SODIUM: 136 meq/L — AB (ref 137–147)

## 2014-04-20 MED ORDER — INSULIN ASPART 100 UNIT/ML ~~LOC~~ SOLN
8.0000 [IU] | Freq: Three times a day (TID) | SUBCUTANEOUS | Status: DC
  Administered 2014-04-20: 8 [IU] via SUBCUTANEOUS
  Administered 2014-04-20: 10:00:00 via SUBCUTANEOUS
  Administered 2014-04-20 – 2014-04-21 (×2): 8 [IU] via SUBCUTANEOUS

## 2014-04-20 NOTE — Progress Notes (Signed)
Pt placed on CPAP set at 9 CMH2O per home settings via nasal mask with 2 LPM O2 bleed in.  Pt tolerating well at this time, RT to monitor and assess as needed.

## 2014-04-20 NOTE — Progress Notes (Signed)
Physical Therapy Treatment Patient Details Name: [name] MRN: [id number] DOB: 09-25-1941 Today's Date: 04/20/2014    History of Present Illness Pt admit with CHF and pre-syncope; PMX: HTN, O2 dependent, morbid obesity    PT Comments    Pt OOB in recliner on 2 lts.  Amb several short trips to equal 45 feet with sitting rest breaks between.  Pt c/o feeling weak.  Noted 3/4 DOE.  Follow Up Recommendations  SNF [facility])     Equipment Recommendations  None recommended by PT    Recommendations for Other Services       Precautions / Restrictions Precautions Precautions: Fall Restrictions Weight Bearing Restrictions: No    Mobility  Bed Mobility                  Transfers Overall transfer level: Needs assistance Equipment used: Rolling walker (2 wheeled) Transfers: Sit to/from Stand Sit to Stand: Min guard         General transfer comment: close guard for safety; no LOB/no signs of dizziness when she first stood  Ambulation/Gait Ambulation/Gait assistance: +2 safety/equipment;Min assist Ambulation Distance (Feet): 45 Feet Assistive device: Rolling walker (2 wheeled) Gait Pattern/deviations: Step-to pattern;Step-through pattern;Trunk flexed Gait velocity: decreased   General Gait Details: amb on 2 lts sats avg 64%.  Amb several short distances to equal 45 feet requiring 3 sitting rest breaks.   Stairs            Wheelchair Mobility    Modified Rankin (Stroke Patients Only)       Balance                                    Cognition                            Exercises      General Comments        Pertinent Vitals/Pain     Home Living                      Prior Function            PT Goals (current goals can now be found in the care plan section) Progress towards PT goals: Progressing toward goals    Frequency  Min 3X/week    PT Plan      Co-evaluation             End of  Session Equipment Utilized During Treatment: Gait belt Activity Tolerance: Patient limited by fatigue;Treatment limited secondary to medical complications (Comment) Patient left: in chair;with call bell/phone within reach     Time: 1444-1515 PT Time Calculation (min): 31 min  Charges:  $Gait Training: 23-37 mins                    G Codes:      Rica Koyanagi  PTA [facility]  Acute  Rehab Pager      [phone]

## 2014-04-20 NOTE — Evaluation (Signed)
Occupational Therapy Evaluation Patient Details Name: [name] MRN: [id number] DOB: [birth year] Today's Date: 04/20/2014    History of Present Illness Pt admit with CHF and pre-syncope; PMX: HTN, O2 dependent, morbid obesity   Clinical Impression   This 73 year old female was admitted with CHF and pre-syncope.  She reports getting dizzy at home (spinning sensation) and falling.  Pt is normally mod I with basic adls.  At time of eval, she was min guard to min A.  She did not have any spinning but did have some lightheadedness after walking to sink; this resolved.  Pt will benefit from skilled OT to increase safety and independence with adls; goals in acute are for overall supervision level.      Follow Up Recommendations  SNF ([facility] progress to [facility]; pt lives alone and can have intermittent assistance)    Equipment Recommendations  None recommended by OT    Recommendations for Other Services       Precautions / Restrictions Precautions Precautions: Fall Restrictions Weight Bearing Restrictions: No      Mobility Bed Mobility Overal bed mobility: Modified Independent             General bed mobility comments: HOB raised and used rail  Transfers     Transfers: Sit to/from Stand Sit to Stand: Min guard         General transfer comment: close guard for safety; no LOB/no signs of dizziness when she first stood    Balance                                            ADL Overall ADL's : Needs assistance/impaired     Grooming: Wash/dry hands;Supervision/safety;Standing   Upper Body Bathing: Set up;Sitting   Lower Body Bathing: Sit to/from stand;Min guard   Upper Body Dressing : Minimal assistance;Sitting (iv)   Lower Body Dressing: Minimal assistance;Sit to/from stand   Toilet Transfer: BSC;Ambulation;Min guard   Toileting- Water quality scientist and Hygiene: Min guard;Sit to/from stand       Functional mobility during ADLs: Min  guard;Rolling walker General ADL Comments: Completed part of vestibular eval:  pt did not have vestibular complaints today with OT.  She did c/o feeling a little lightheaded when standing at sink.  Unable to get blood pressure with dynamap.  dizziness resolved.  Pt had dizzy episode in standing when she was at home.  She says room was spinning.  She was able to look up and down asymptomatically.  VOR wfls; had to test a couple of times as she got distracted and stopped looking at me.   Gaze holding and smooth pursuits were wfls.  Pt hasn't been OOB much; min guard for ambulation for safety and she needs occasional min A for LB adls.          Vision                 Additional Comments: eye alignment wfls   Perception     Praxis      Pertinent Vitals/Pain No c/o pain.  Unable to get BP with dynamap:  Lightheadedness resolved.  0n 2 liters 02--wears this at home     Hand Dominance Right   Extremity/Trunk Assessment Upper Extremity Assessment Upper Extremity Assessment: Overall WFL for tasks assessed       Cervical / Trunk Assessment Cervical / Trunk Assessment: Normal  Communication Communication Communication: No difficulties   Cognition Arousal/Alertness: Awake/alert Behavior During Therapy: WFL for tasks assessed/performed Overall Cognitive Status: Within Functional Limits for tasks assessed                     General Comments   See vestibular testing under adls.      Exercises       Shoulder Instructions      Home Living Family/patient expects to be discharged to:: Private residence Living Arrangements: Alone Available Help at Discharge: Family;Personal care attendant;Available PRN/intermittently Type of Home: Apartment Home Access: Level entry     Home Layout: One level     Bathroom Shower/Tub: Teacher, early years/pre: Handicapped height     Home Equipment: Environmental consultant - 4 wheels;Tub bench;Grab bars - toilet;Grab bars -  tub/shower;Bedside commode          Prior Functioning/Environment Level of Independence: Needs assistance    ADL's / Homemaking Assistance Needed: son does grocery shopping; 1x/month housekeeper        OT Diagnosis: Generalized weakness   OT Problem List: Decreased strength;Decreased activity tolerance;Cardiopulmonary status limiting activity   OT Treatment/Interventions: Self-care/ADL training;DME and/or AE instruction;Patient/family education;Balance training;Therapeutic activities    OT Goals(Current goals can be found in the care plan section) Acute Rehab OT Goals Patient Stated Goal: return to independent OT Goal Formulation: With patient Time For Goal Achievement: 05/04/14 Potential to Achieve Goals: Good ADL Goals Pt Will Transfer to Toilet: with supervision;bedside commode;ambulating Additional ADL Goal #1: pt will gather adl supplies at supervision level and complete adl without supervision, sit to stand Additional ADL Goal #2: pt will get up from flat bed without rails independently in preparation for adls  OT Frequency: Min 2X/week   Barriers to D/C:            Co-evaluation              End of Session Nurse Communication: Mobility status  Activity Tolerance: Patient tolerated treatment well Patient left: in chair;with call bell/phone within reach   Time: 0933-1022 OT Time Calculation (min): 49 min Charges:  OT General Charges $OT Visit: 1 Procedure OT Evaluation $Initial OT Evaluation Tier I: 1 Procedure OT Treatments $Self Care/Home Management : 23-37 mins $Therapeutic Activity: 8-22 mins G-Codes: OT G-codes **NOT FOR INPATIENT CLASS** Functional Assessment Tool Used: clinical observation and judgment Functional Limitation: Self care Self Care Current Status (V4944): At least 20 percent but less than 40 percent impaired, limited or restricted Self Care Goal Status (H6759): At least 1 percent but less than 20 percent impaired, limited or  restricted  [facility] 04/20/2014, 11:41 AM Lesle Chris, OTR/L [phone] 04/20/2014

## 2014-04-20 NOTE — Plan of Care (Signed)
Problem: Phase I Progression Outcomes Goal: OOB as tolerated unless otherwise ordered Outcome: Progressing Ambulating with front wheel walker/ PT

## 2014-04-20 NOTE — Plan of Care (Signed)
Problem: Food- and Nutrition-Related Knowledge Deficit (NB-1.1) Goal: Nutrition education Formal process to instruct or train a patient/client in a skill or to impart knowledge to help patients/clients voluntarily manage or modify food choices and eating behavior to maintain or improve health. Outcome: Completed/Met Date Met:  04/20/14  RD consulted for nutrition education regarding diabetes.     Lab Results  Component Value Date    HGBA1C 9.6* 04/17/2014    RD provided "Carbohydrate Counting for People with Diabetes" handout from the Academy of Nutrition and Dietetics. Discussed different food groups and their effects on blood sugar, emphasizing carbohydrate-containing foods. Provided list of carbohydrates and recommended serving sizes of common foods.  Discussed importance of controlled and consistent carbohydrate intake throughout the day. Provided examples of ways to balance meals/snacks and encouraged intake of high-fiber, whole grain complex carbohydrates. Teach back method used.  Expect good compliance.  Body mass index is 55.01 kg/(m^2). Pt meets criteria for morbid obesity based on current BMI.  Current diet order is carb modified, patient is consuming approximately 100% of meals at this time. Labs and medications reviewed. No further nutrition interventions warranted at this time. RD contact information provided. If additional nutrition issues arise, please re-consult RD.  Terrace Arabia RD, LDN

## 2014-04-20 NOTE — Progress Notes (Signed)
Progress Note   [name] KXF:[id number] DOB: 16-Mar-1941 DOA: 04/17/2014 PCP: Shirline Frees, MD   Brief Narrative:   [name] is an 73 y.o. female with a PMH of asthma, sarcoidosis, morbid obesity, hypertension, atrial fibrillation on chronic anticoagulation, chronic respiratory failure on 2 L of home oxygen therapy, obstructive sleep apnea on CPAP, type 2 diabetes, stage I breast cancer status post right lumpectomy/radiation treatment, recent evaluation by ENT for hearing loss managed with 60 mg of prednisone daily who was admitted 04/17/14 with a chief complaint of positional syncope/fall to the floor but without loss of consciousness or altered mental status. Upon initial evaluation in the ED, the patient was normotensive, blood glucose 579 without evidence of DKA.  Assessment/Plan:   Principal Problem:   Pre-syncope / dehydration / vertigo / hearing loss  Troponins negative x3.  Chest x-ray negative for active cardiopulmonary disease with incidental possible nodular opacity in the right lower lung.  Urinalysis negative for nitrites and leukocytes with a few bacteria noted on microscopy. Urine cultures grew multiple bacterial morphotypes, greater than 100,000 colonies, so will treat with Cipro x3 days.  CT of the head negative for acute findings. Unable to do MRI as the patient could not fit in the scanner. We'll try to get MRI open scanner at [facility] (under repair until 04/21/14), which hopefully will be done 04/21/14.  Gently hydrated overnight, presyncope likely from dehydration secondary to osmotic diuresis in the setting of uncontrolled hyperglycemia. BUN improved with IV fluids.  Vestibular PT/OT evaluation requested. Active Problems:   History of breast cancer / lung nodule  Continue Aromasin.  Nodule noted on chest x-ray has been evaluated in the past the CT scan 09/26/13 of the chest showing stable small bilateral pulmonary nodules from old granulomatous disease  or sarcoidosis.   Chronic respiratory failure / Obstructive sleep apnea  Continue supplemental oxygen and CPAP each bedtime.   Gout  Continue allopurinol and colchicine.   COPD (chronic obstructive pulmonary disease)  Continue Symbicort and when necessary bronchodilators.   Atrial fibrillation, persistent  Rate controlled on Cardizem. Anticoagulated with Elliquis.   Chronic diastolic CHF (congestive heart failure)  Appears to be compensated. Diuretics held secondary to dehydration from hyperglycemia with presumed osmotic diuresis.   Hyperglycemia / type 2 diabetes  Glycemic control affected by recent steroid use.  Currently being managed with metformin, Lantus 28 units daily and moderate scale SSI. CBGs 150-356. Continue insulin resistant sliding scale and increase meal coverage.  Hemoglobin A1c 9.6 corresponding to a mean plasma glucose of 229. Diabetes coordinator consulted for uncontrolled diabetes, which is likely being exacerbated by steroids.  Dietitian consultation requested.   DVT Prophylaxis  Anticoagulated on Eliquis.  Code Status: Full. Family Communication: No family currently at the bedside.  Updated son, Joeanna Howdyshell, by telephone at [phone]. Disposition Plan: Home when stable.   IV Access:    Peripheral IV   Procedures:    None.   Medical Consultants:    None.   Other Consultants:    Physical therapy  Diabetes coordinator   Anti-Infectives:    None.  Subjective:   [name] feels better with decreased nausea and vertigo. She feels very fatigued and lacks energy. No dyspnea or cough.  Objective:    Filed Vitals:   04/19/14 1345 04/19/14 1922 04/19/14 2105 04/20/14 0452  BP: 115/59  121/55 118/74  Pulse: 63  88 66  Temp: 98.1 F (36.7 C)  98.2 F (36.8 C) 98.4  F (36.9 C)  TempSrc: Oral  Oral Oral  Resp: 20  18 18   Height:      Weight:      SpO2: 97% 93% 99% 96%    Intake/Output Summary (Last 24 hours) at  04/20/14 0741 Last data filed at 04/20/14 0453  Gross per 24 hour  Intake   1560 ml  Output   2550 ml  Net   -990 ml    Exam: Gen:  NAD Cardiovascular:  HSIR, No M/R/G Respiratory:  Lungs CTAB Gastrointestinal:  Abdomen soft, NT/ND, + BS Extremities:  No C/E/C, varicose veins Skin: Intertrigo under breasts and lower abdominal folds   Data Reviewed:    Labs: Basic Metabolic Panel:  Recent Labs Lab 04/17/14 1747 04/18/14 0626 04/20/14 0407  NA 132* 138 136*  K 5.4* 4.4 4.7  CL 90* 98 95*  CO2 29 31 32  GLUCOSE 579* 159* 178*  BUN 51* 36* 24*  CREATININE 0.95 0.65 0.68  CALCIUM 9.1 8.9 8.8   GFR Estimated Creatinine Clearance: 73.4 ml/min (by C-G formula based on Cr of 0.68). Liver Function Tests:  Recent Labs Lab 04/17/14 1747  AST 17  ALT 24  ALKPHOS 245*  BILITOT 0.7  PROT 7.2  ALBUMIN 3.9   CBC:  Recent Labs Lab 04/17/14 1747 04/18/14 0626 04/20/14 0407  WBC 13.0* 12.9* 12.7*  NEUTROABS 11.5*  --   --   HGB 15.8* 15.0 14.1  HCT 46.0 44.2 43.0  MCV 84.9 84.8 86.5  PLT 202 166 149*   Cardiac Enzymes:  Recent Labs Lab 04/17/14 1747 04/18/14 04/18/14 0626 04/18/14 1214  TROPONINI <0.30 <0.30 <0.30 <0.30   BNP (last 3 results)  Recent Labs  01/24/14 1945 01/30/14 0523 03/20/14 1537  PROBNP 665.4* 404.5* 1011.00*   CBG:  Recent Labs Lab 04/18/14 2105 04/19/14 0716 04/19/14 1140 04/19/14 1658 04/19/14 2127  GLUCAP 356* 150* 179* 256* 315*   Sepsis Labs:  Recent Labs Lab 04/17/14 1747 04/18/14 0626 04/20/14 0407  WBC 13.0* 12.9* 12.7*   Microbiology Recent Results (from the past 240 hour(s))  URINE CULTURE     Status: None   Collection Time    04/17/14  8:57 PM      Result Value Ref Range Status   Specimen Description URINE, CLEAN CATCH   Final   Special Requests NONE   Final   Culture  Setup Time     Final   Value: 04/18/2014 01:52     Performed at [place]     Final   Value:  >=100,000 COLONIES/ML     Performed at [facility]   Culture     Final   Value: Multiple bacterial morphotypes present, none predominant. Suggest appropriate recollection if clinically indicated.     Performed at [facility]   Report Status 04/19/2014 FINAL   Final     Radiographs/Studies:   Dg Chest 2 View  04/17/2014   CLINICAL DATA:  Shortness of breath, cough  EXAM: CHEST  2 VIEW  COMPARISON:  DG CHEST 2 VIEW dated 03/25/2014; DG CHEST 2 VIEW dated 06/06/2013; CT ANGIO CHEST W/CM &/OR WO/CM dated 05/11/2013  FINDINGS: There is a nodular opacity projected over the right lower lung measuring 15 mm not delineated on the lateral view. There is mild nonspecific bilateral interstitial thickening, likely chronic. There is no focal parenchymal opacity, pleural effusion, or pneumothorax. The heart and mediastinal contours are unremarkable.  There is mild thoracic spine  spondylosis.  IMPRESSION: No active cardiopulmonary disease.  There is a nodular opacity projected over the right lower lung measuring 15 mm not delineated on the lateral view. This may reflect a small pulmonary nodule versus artifact which is superficial to the patient. Follow up radiography is recommended.   Electronically Signed   By: Kathreen Devoid   On: 04/17/2014 20:42   Ct Head Wo Contrast  04/17/2014   CLINICAL DATA:  Dizziness.  Fell.  EXAM: CT HEAD WITHOUT CONTRAST  TECHNIQUE: Contiguous axial images were obtained from the base of the skull through the vertex without intravenous contrast.  COMPARISON:  Brain MRI 05/11/2012  FINDINGS: Mild age related cerebral atrophy, ventriculomegaly and periventricular white matter disease. Benign-appearing bilateral basal ganglia calcifications. No extra-axial fluid collections. No CT findings for acute intracranial hemorrhage or hemispheric infarction. No mass lesions. The brainstem and cerebellum appear normal.  No acute skull fractures identified. Hyperostosis frontalis interna is  noted. The paranasal sinuses and mastoid air cells are clear. The globes are intact.  IMPRESSION: Age related cerebral atrophy, ventriculomegaly and periventricular white matter disease.  No acute intracranial findings or skull fracture.   Electronically Signed   By: Kalman Jewels M.D.   On: 04/17/2014 18:16    Medications:   . allopurinol  200 mg Oral Daily  . apixaban  5 mg Oral BID  . ciprofloxacin  400 mg Intravenous Q12H  . colchicine  0.6 mg Oral BID  . diltiazem  240 mg Oral QPM  . docusate sodium  100 mg Oral BID  . exemestane  25 mg Oral QPC breakfast  . ferrous fumarate  1 tablet Oral BID  . insulin aspart  0-20 Units Subcutaneous TID WC  . insulin aspart  0-5 Units Subcutaneous QHS  . insulin aspart  6 Units Subcutaneous TID WC  . insulin glargine  28 Units Subcutaneous QHS  . ipratropium  0.5 mg Nebulization BID  . levalbuterol  0.63 mg Nebulization BID  . metFORMIN  500 mg Oral BID WC  . mometasone-formoterol  2 puff Inhalation BID  . multivitamin with minerals  1 tablet Oral Daily  . nystatin   Topical TID  . predniSONE  15 mg Oral Q breakfast   Followed by  . [START ON 04/21/2014] predniSONE  10 mg Oral Q breakfast   Followed by  . [START ON 04/22/2014] predniSONE  5 mg Oral Q breakfast  . senna  1 tablet Oral BID  . vitamin C  1,000 mg Oral Daily   Continuous Infusions:    Time spent: 25 minutes.    LOS: 3 days   [place]  [facility] Pager [phone]. If unable to reach me by pager, please call my cell phone at [phone].  *Please refer to [email or web address], password [email or web address] to get updated schedule on who will round on this patient, as hospitalists switch teams weekly. If 7PM-7AM, please contact night-coverage at [email or web address], password [email or web address] for any overnight needs.  04/20/2014, 7:41 AM    **Disclaimer: This note was dictated with voice recognition software. Similar sounding words can inadvertently be transcribed and this note may contain  transcription errors which may not have been corrected upon publication of note.**

## 2014-04-20 NOTE — Progress Notes (Signed)
CARE MANAGEMENT NOTE 04/20/2014  Patient:  [name], [name]   Account Number:  [id number]  Date Initiated:  04/20/2014  Documentation initiated by:  [facility]  Subjective/Objective Assessment:     Action/Plan:   Anticipated DC Date:  04/22/2014   Anticipated DC Plan:  SKILLED NURSING FACILITY  In-house referral  Clinical Social Worker      DC Planning Services  CM consult      Choice offered to / List presented to:             Status of service:  Completed, signed off Medicare Important Message given?   (If response is "NO", the following Medicare IM given date fields will be blank) Date Medicare IM given:   Date Additional Medicare IM given:    Discharge Disposition:  [place]  Per UR Regulation:    If discussed at Long Length of Stay Meetings, dates discussed:    Comments:  04/20/2014 1630 NCM spoke to pt and states she want to go to [facility] NCM contacted CSW for SNF placement. Jonnie Finner RN CCM Case Mgmt phone [phone]

## 2014-04-21 ENCOUNTER — Ambulatory Visit (HOSPITAL_COMMUNITY)
Admit: 2014-04-21 | Discharge: 2014-04-21 | Disposition: A | Discharge status: Home / Self Care | Payer: Medicare Other | Attending: Internal Medicine | Admitting: Internal Medicine

## 2014-04-21 DIAGNOSIS — J9600 Acute respiratory failure, unspecified whether with hypoxia or hypercapnia: Secondary | ICD-10-CM

## 2014-04-21 DIAGNOSIS — R42 Dizziness and giddiness: Secondary | ICD-10-CM

## 2014-04-21 DIAGNOSIS — N390 Urinary tract infection, site not specified: Principal | ICD-10-CM

## 2014-04-21 LAB — GLUCOSE, CAPILLARY
GLUCOSE-CAPILLARY: 175 mg/dL — AB (ref 70–99)
Glucose-Capillary: 92 mg/dL (ref 70–99)

## 2014-04-21 MED ORDER — GADOBENATE DIMEGLUMINE 529 MG/ML IV SOLN
20.0000 mL | Freq: Once | INTRAVENOUS | Status: AC
  Administered 2014-04-21: 20 mL via INTRAVENOUS

## 2014-04-21 NOTE — Progress Notes (Addendum)
Occupational Therapy Treatment Patient Details Name: [name] MRN: [id number] DOB: [birth year] Today's Date: 04/21/2014    History of present illness Pt admit with CHF and pre-syncope; PMX: HTN, O2 dependent, morbid obesity   OT comments  Pt worked on toilet transfers today and bathing routine. She was 97-99% on 2L at rest and with activity. Educated on purse lip breathing techniques and reinforced energy conservation strategies.   Follow Up Recommendations  Supervision/Assistance - 24 hour    Equipment Recommendations  None recommended by OT    Recommendations for Other Services      Precautions / Restrictions Precautions Precautions: Fall Precaution Comments: O2 dependent       Mobility Bed Mobility Overal bed mobility: Needs Assistance Bed Mobility: Supine to Sit     Supine to sit: Supervision     General bed mobility comments: bed flat and used rails  Transfers Overall transfer level: Needs assistance Equipment used: None Transfers: Sit to/from Stand Sit to Stand: Min guard         General transfer comment: min guard for safety. pt only reports being initially dizzy with sitting up on EOB.    Balance                                   ADL           Upper Body Bathing: Min guard;Standing   Lower Body Bathing: Minimal assistance;Sit to/from stand (pt states she uses a long handled sponge at home)       Lower Body Dressing:  (with socks)   Toilet Transfer: Min guard;Stand-pivot;BSC   Toileting- Clothing Manipulation and Hygiene: Min guard;Sit to/from stand         General ADL Comments: Pt sats on 2L between 97 and 99% at rest and with activity. She has 2/4 dyspnea with activity. She is initially dizzy with supine to sit but states it is cleared once she sits up for a minute or so. She tolerates only short periods of time standing to wash periarea and then has to sit down due to some fatigue. She states she is tired from not  sleeping well last night. educated on purse lip breathing techniques.       Vision                     Perception     Praxis      Cognition   Behavior During Therapy: WFL for tasks assessed/performed Overall Cognitive Status: Within Functional Limits for tasks assessed                       Extremity/Trunk Assessment               Exercises     Shoulder Instructions       General Comments      Pertinent Vitals/ Pain       97-99% at rest and with activity on 2L  Home Living                                          Prior Functioning/Environment              Frequency Min 2X/week     Progress Toward Goals  OT Goals(current goals can now be found in  the care plan section)  Progress towards OT goals: Progressing toward goals     Plan Discharge plan remains appropriate    Co-evaluation                 End of Session Equipment Utilized During Treatment: Oxygen   Activity Tolerance Patient tolerated treatment well   Patient Left in chair;with call bell/phone within reach   Nurse Communication          Time: 0900-0940 OT Time Calculation (min): 40 min  Charges: OT General Charges $OT Visit: 1 Procedure OT Treatments $Self Care/Home Management : 8-22 mins $Therapeutic Activity: 23-37 mins  Jules Schick [phone] 04/21/2014, 9:56 AM

## 2014-04-21 NOTE — Progress Notes (Signed)
Pt over to [facility] for MRI of the brain.

## 2014-04-21 NOTE — Progress Notes (Signed)
Clinical Social Work Department BRIEF PSYCHOSOCIAL ASSESSMENT 04/21/2014  Patient:  [name], [name]     Account Number:  [id number]     Admit date:  04/17/2014  Clinical Social Worker:  Ulyess Blossom  Date/Time:  04/21/2014 11:30 AM  Referred by:  Physician  Date Referred:  04/21/2014 Referred for  SNF Placement   Other Referral:   Interview type:  Patient Other interview type:    PSYCHOSOCIAL DATA Living Status:  ALONE Admitted from facility:   Level of care:   Primary support name:  [name] Godar/[phone] Primary support relationship to patient:  CHILD, ADULT Degree of support available:   adequate    CURRENT CONCERNS Current Concerns  Post-Acute Placement   Other Concerns:    SOCIAL WORK ASSESSMENT / PLAN CSW received referral for New SNF.    CSW met with pt at bedside. CSW introduced self and explained role. Pt discussed that she lives alone. CSW discussed recommendation from PT/OT for SNF for rehab. Pt agreeable and would like to go to [facility]. Pt reports that she has been to [facility] in the past and was very satisfied with the care she received at [facility]. CSW discussed that CSW will contact [facility] and sent pt clinicals. CSW notified pt that if [facility] is unable to offer a bed then [facility] search will need to be initiated. Pt expressed understanding.    CSW completed FL2 and initiated SNF search to [facility]. CSW contacted [facility] directly to notify of pt interest as pt has been to facility in the past.    CSW to follow up with pt once response received from [facility].    CSW to continue to follow and assist with pt discharge planning needs.   Assessment/plan status:  Psychosocial Support/Ongoing Assessment of Needs Other assessment/ plan:   discharge planning   Information/referral to community resources:   Referral sent to [facility] at pt request, will provide [facility] list if [facility]  unable to offer a bed    PATIENT'S/FAMILY'S RESPONSE TO PLAN OF CARE: Pt alert and oriented x 4. Pt quiet, but appropriately engaged in conversation. Pt hopeful for [facility] as she is familiar with facility from past rehab stay.    Alison Murray, MSW, [place] Work [phone]

## 2014-04-21 NOTE — Progress Notes (Addendum)
Clinical Social Work Department CLINICAL SOCIAL WORK PLACEMENT NOTE 04/21/2014  Patient:  [name], [name]  Account Number:  [id number] Admit date:  04/17/2014  Clinical Social Worker:  Ulyess Blossom  Date/time:  04/21/2014 12:00 N  Clinical Social Work is seeking post-discharge placement for this patient at the following level of care:   SKILLED NURSING   (*CSW will update this form in Epic as items are completed)   04/21/2014  Patient/family provided with [place] Department of Clinical Social Work's list of facilities offering this level of care within the geographic area requested by the patient (or if unable, by the patient's family).  04/21/2014  Patient/family informed of their freedom to choose among providers that offer the needed level of care, that participate in Medicare, Medicaid or managed care program needed by the patient, have an available bed and are willing to accept the patient.  04/21/2014  Patient/family informed of [facility]' ownership interest in [facility], as well as of the fact that they are under no obligation to receive care at this facility.  PASARR submitted to EDS on 04/21/2014 PASARR number received from EDS on 04/21/2014  FL2 transmitted to all facilities in geographic area requested by pt/family on  04/21/2014 FL2 transmitted to all facilities within larger geographic area on   Patient informed that his/her managed care company has contracts with or will negotiate with  certain facilities, including the following:     Patient/family informed of bed offers received:  04/22/2014 Patient chooses bed at [facility] Physician recommends and patient chooses bed at    Patient to be transferred to  on  [address] on 04/23/2014 Patient to be transferred to facility by ambulance [name])  The following physician request were entered in Epic:   Additional Comments:   Alison Murray, MSW, [place] Work [phone]

## 2014-04-21 NOTE — Progress Notes (Signed)
Progress Note   [name] CHY:[id number] DOB: 02/09/1941 DOA: 04/17/2014 PCP: Shirline Frees, MD   Brief Narrative:   [name] is an 73 y.o. female with a PMH of asthma, sarcoidosis, morbid obesity, hypertension, atrial fibrillation on chronic anticoagulation, chronic respiratory failure on 2 L of home oxygen therapy, obstructive sleep apnea on CPAP, type 2 diabetes, stage I breast cancer status post right lumpectomy/radiation treatment, recent evaluation by ENT for hearing loss managed with 60 mg of prednisone daily who was admitted 04/17/14 with a chief complaint of positional syncope/fall to the floor but without loss of consciousness or altered mental status. Upon initial evaluation in the ED, the patient was normotensive, blood glucose 579 without evidence of DKA.  Assessment/Plan:   Principal Problem:   Pre-syncope / dehydration / vertigo / hearing loss  Troponins negative x3.  Chest x-ray negative for active cardiopulmonary disease with incidental possible nodular opacity in the right lower lung.  Urinalysis negative for nitrites and leukocytes with a few bacteria noted on microscopy. Urine cultures grew multiple bacterial morphotypes, greater than 100,000 colonies, so will treat with Cipro x3 days.  CT of the head negative for acute findings. Unable to do MRI as the patient could not fit in the scanner. We'll try to get MRI open scanner at [facility] (under repair until 04/21/14), which hopefully will be done today.  Gently hydrated overnight, presyncope likely from dehydration secondary to osmotic diuresis in the setting of uncontrolled hyperglycemia. BUN improved with IV fluids.  Continue Vestibular PT/OT. Active Problems:   History of breast cancer / lung nodule  Continue Aromasin.  Nodule noted on chest x-ray has been evaluated in the past the CT scan 09/26/13 of the chest showing stable small bilateral pulmonary nodules from old granulomatous disease or  sarcoidosis.   Chronic respiratory failure / Obstructive sleep apnea  Continue supplemental oxygen and CPAP each bedtime.   Gout  Continue allopurinol and colchicine.   COPD (chronic obstructive pulmonary disease)  Continue Symbicort and when necessary bronchodilators.   Atrial fibrillation, persistent  Rate controlled on Cardizem. Anticoagulated with Elliquis.   Chronic diastolic CHF (congestive heart failure)  Appears to be compensated. Diuretics held secondary to dehydration from hyperglycemia with presumed osmotic diuresis.   Hyperglycemia / type 2 diabetes  Glycemic control affected by recent steroid use.  Currently being managed with metformin, Lantus 28 units daily and moderate scale SSI. CBGs 150-356. Continue insulin resistant sliding scale and increase meal coverage.  Hemoglobin A1c 9.6 corresponding to a mean plasma glucose of 229. Diabetes coordinator consulted for uncontrolled diabetes, which is likely being exacerbated by steroids.  Dietitian consultation requested.   DVT Prophylaxis  Anticoagulated on Eliquis.  Code Status: Full. Family Communication: No family currently at the bedside.  Updated son, Synia Douglass, by telephone at [phone] [birth year]19/49. Disposition Plan: Home when stable.   IV Access:    Peripheral IV   Procedures:    None.   Medical Consultants:    None.   Other Consultants:    Physical therapy  Diabetes coordinator   Anti-Infectives:    Cipro 04/19/14--->  Subjective:   [name] continues to report improvement with decreased nausea and vertigo. She continues to feel fatigued and lacks energy. No dyspnea or cough. Says her bowels are moving.  Objective:    Filed Vitals:   04/20/14 2303 04/21/14 0448 04/21/14 0923 04/21/14 0947  BP: 121/50 116/69    Pulse: 94 56    Temp: 97.9  F (36.6 C) 97 F (36.1 C)    TempSrc: Oral Oral    Resp: 18 20    Height:      Weight:      SpO2: 99% 99% 99% 97%     Intake/Output Summary (Last 24 hours) at 04/21/14 1019 Last data filed at 04/21/14 0600  Gross per 24 hour  Intake   1440 ml  Output   4200 ml  Net  -2760 ml    Exam: Gen:  NAD Cardiovascular:  HSIR, No M/R/G Respiratory:  Lungs CTAB Gastrointestinal:  Abdomen soft, NT/ND, + BS Extremities:  No C/E/C, varicose veins   Data Reviewed:    Labs: Basic Metabolic Panel:  Recent Labs Lab 04/17/14 1747 04/18/14 0626 04/20/14 0407  NA 132* 138 136*  K 5.4* 4.4 4.7  CL 90* 98 95*  CO2 29 31 32  GLUCOSE 579* 159* 178*  BUN 51* 36* 24*  CREATININE 0.95 0.65 0.68  CALCIUM 9.1 8.9 8.8   GFR Estimated Creatinine Clearance: 73.4 ml/min (by C-G formula based on Cr of 0.68). Liver Function Tests:  Recent Labs Lab 04/17/14 1747  AST 17  ALT 24  ALKPHOS 245*  BILITOT 0.7  PROT 7.2  ALBUMIN 3.9   CBC:  Recent Labs Lab 04/17/14 1747 04/18/14 0626 04/20/14 0407  WBC 13.0* 12.9* 12.7*  NEUTROABS 11.5*  --   --   HGB 15.8* 15.0 14.1  HCT 46.0 44.2 43.0  MCV 84.9 84.8 86.5  PLT 202 166 149*   Cardiac Enzymes:  Recent Labs Lab 04/17/14 1747 04/18/14 04/18/14 0626 04/18/14 1214  TROPONINI <0.30 <0.30 <0.30 <0.30   BNP (last 3 results)  Recent Labs  01/24/14 1945 01/30/14 0523 03/20/14 1537  PROBNP 665.4* 404.5* 1011.00*   CBG:  Recent Labs Lab 04/20/14 0733 04/20/14 1127 04/20/14 1635 04/20/14 2147 04/21/14 0815  GLUCAP 162* 202* 256* 193* 92   Sepsis Labs:  Recent Labs Lab 04/17/14 1747 04/18/14 0626 04/20/14 0407  WBC 13.0* 12.9* 12.7*   Microbiology Recent Results (from the past 240 hour(s))  URINE CULTURE     Status: None   Collection Time    04/17/14  8:57 PM      Result Value Ref Range Status   Specimen Description URINE, CLEAN CATCH   Final   Special Requests NONE   Final   Culture  Setup Time     Final   Value: 04/18/2014 01:52     Performed at [facility]     Final   Value: >=100,000  COLONIES/ML     Performed at [facility]   Culture     Final   Value: Multiple bacterial morphotypes present, none predominant. Suggest appropriate recollection if clinically indicated.     Performed at [facility]   Report Status 04/19/2014 FINAL   Final     Radiographs/Studies:   Dg Chest 2 View  04/17/2014   CLINICAL DATA:  Shortness of breath, cough  EXAM: CHEST  2 VIEW  COMPARISON:  DG CHEST 2 VIEW dated 03/25/2014; DG CHEST 2 VIEW dated 06/06/2013; CT ANGIO CHEST W/CM &/OR WO/CM dated 05/11/2013  FINDINGS: There is a nodular opacity projected over the right lower lung measuring 15 mm not delineated on the lateral view. There is mild nonspecific bilateral interstitial thickening, likely chronic. There is no focal parenchymal opacity, pleural effusion, or pneumothorax. The heart and mediastinal contours are unremarkable.  There is mild thoracic spine spondylosis.  IMPRESSION:  No active cardiopulmonary disease.  There is a nodular opacity projected over the right lower lung measuring 15 mm not delineated on the lateral view. This may reflect a small pulmonary nodule versus artifact which is superficial to the patient. Follow up radiography is recommended.   Electronically Signed   By: Kathreen Devoid   On: 04/17/2014 20:42   Ct Head Wo Contrast  04/17/2014   CLINICAL DATA:  Dizziness.  Fell.  EXAM: CT HEAD WITHOUT CONTRAST  TECHNIQUE: Contiguous axial images were obtained from the base of the skull through the vertex without intravenous contrast.  COMPARISON:  Brain MRI 05/11/2012  FINDINGS: Mild age related cerebral atrophy, ventriculomegaly and periventricular white matter disease. Benign-appearing bilateral basal ganglia calcifications. No extra-axial fluid collections. No CT findings for acute intracranial hemorrhage or hemispheric infarction. No mass lesions. The brainstem and cerebellum appear normal.  No acute skull fractures identified. Hyperostosis frontalis interna is noted. The  paranasal sinuses and mastoid air cells are clear. The globes are intact.  IMPRESSION: Age related cerebral atrophy, ventriculomegaly and periventricular white matter disease.  No acute intracranial findings or skull fracture.   Electronically Signed   By: Kalman Jewels M.D.   On: 04/17/2014 18:16    Medications:   . allopurinol  200 mg Oral Daily  . apixaban  5 mg Oral BID  . ciprofloxacin  400 mg Intravenous Q12H  . colchicine  0.6 mg Oral BID  . diltiazem  240 mg Oral QPM  . docusate sodium  100 mg Oral BID  . exemestane  25 mg Oral QPC breakfast  . ferrous fumarate  1 tablet Oral BID  . insulin aspart  0-20 Units Subcutaneous TID WC  . insulin aspart  0-5 Units Subcutaneous QHS  . insulin aspart  8 Units Subcutaneous TID WC  . insulin glargine  28 Units Subcutaneous QHS  . ipratropium  0.5 mg Nebulization BID  . levalbuterol  0.63 mg Nebulization BID  . metFORMIN  500 mg Oral BID WC  . mometasone-formoterol  2 puff Inhalation BID  . multivitamin with minerals  1 tablet Oral Daily  . nystatin   Topical TID  . predniSONE  10 mg Oral Q breakfast   Followed by  . [START ON 04/22/2014] predniSONE  5 mg Oral Q breakfast  . senna  1 tablet Oral BID  . vitamin C  1,000 mg Oral Daily   Continuous Infusions:    Time spent: 25 minutes.    LOS: 4 days   [place]  [facility] Pager [phone]. If unable to reach me by pager, please call my cell phone at [phone].  *Please refer to [email or web address], password [email or web address] to get updated schedule on who will round on this patient, as hospitalists switch teams weekly. If 7PM-7AM, please contact night-coverage at [email or web address], password [email or web address] for any overnight needs.  04/21/2014, 10:19 AM    **Disclaimer: This note was dictated with voice recognition software. Similar sounding words can inadvertently be transcribed and this note may contain transcription errors which may not have been corrected upon publication of  note.**

## 2014-04-22 DIAGNOSIS — G4733 Obstructive sleep apnea (adult) (pediatric): Secondary | ICD-10-CM

## 2014-04-22 LAB — GLUCOSE, CAPILLARY
GLUCOSE-CAPILLARY: 188 mg/dL — AB (ref 70–99)
GLUCOSE-CAPILLARY: 120 mg/dL — AB (ref 70–99)
GLUCOSE-CAPILLARY: 182 mg/dL — AB (ref 70–99)
Glucose-Capillary: 103 mg/dL — ABNORMAL HIGH (ref 70–99)
Glucose-Capillary: 170 mg/dL — ABNORMAL HIGH (ref 70–99)
Glucose-Capillary: 172 mg/dL — ABNORMAL HIGH (ref 70–99)
Glucose-Capillary: 205 mg/dL — ABNORMAL HIGH (ref 70–99)

## 2014-04-22 MED ORDER — INSULIN ASPART 100 UNIT/ML ~~LOC~~ SOLN: 0.0000 [IU] | Freq: Three times a day (TID) | SUBCUTANEOUS | Status: DC

## 2014-04-22 MED ORDER — INSULIN ASPART 100 UNIT/ML ~~LOC~~ SOLN
9.0000 [IU] | Freq: Three times a day (TID) | SUBCUTANEOUS | Status: DC
  Administered 2014-04-22 – 2014-04-23 (×3): 9 [IU] via SUBCUTANEOUS

## 2014-04-22 MED ORDER — NYSTATIN 100000 UNIT/GM EX POWD: CUTANEOUS | Status: DC

## 2014-04-22 MED ORDER — INSULIN ASPART 100 UNIT/ML ~~LOC~~ SOLN: 0.0000 [IU] | Freq: Every day | SUBCUTANEOUS | Status: DC

## 2014-04-22 MED ORDER — INSULIN ASPART 100 UNIT/ML ~~LOC~~ SOLN: 9.0000 [IU] | Freq: Three times a day (TID) | SUBCUTANEOUS | Status: DC

## 2014-04-22 MED ORDER — LORAZEPAM 0.5 MG PO TABS: ORAL_TABLET | ORAL | Status: DC

## 2014-04-22 NOTE — Discharge Summary (Signed)
Physician Discharge Summary  [name] [id number] DOB: [birth year] DOA: 04/17/2014  PCP: Shirline Frees, MD  Admit date: 04/17/2014 Discharge date: 04/22/2014   Recommendations for Outpatient Follow-Up:   1. Recommend close followup with outpatient glycemic control, insulin needs have gone up dramatically while on steroids, but should improve with steroid taper. 2. Recommend vestibular PT/OT. The patient is on chronic oxygen therapy and CPAP each bedtime.  Discharge Diagnosis:   Principal Problem:    Pre-syncope Active Problems:    Obstructive sleep apnea    Breast cancer, stage 1 s/p rt lumpectomy and radiation 2012    Gout    Nausea and vomiting    COPD (chronic obstructive pulmonary disease)    Chronic respiratory failure with hypoxia    Atrial fibrillation, persistent    Chronic diastolic CHF (congestive heart failure)    Diabetes mellitus, type II    Hyperglycemia    Hyponatremia    Hyperkalemia    Dehydration    Vertigo    UTI (urinary tract infection)   Discharge Condition: Improved.  Diet recommendation: Low sodium, heart healthy.  Carbohydrate-modified.     History of Present Illness:   [name] is an 73 y.o. female with a PMH of asthma, sarcoidosis, morbid obesity, hypertension, atrial fibrillation on chronic anticoagulation, chronic respiratory failure on 2 L of home oxygen therapy, obstructive sleep apnea on CPAP, type 2 diabetes, stage I breast cancer status post right lumpectomy/radiation treatment, recent evaluation by ENT for hearing loss managed with 60 mg of prednisone daily who was admitted 04/17/14 with a chief complaint of positional syncope/fall to the floor but without loss of consciousness or altered mental status. Upon initial evaluation in the ED, the patient was normotensive, blood glucose 579 without evidence of DKA. She is being treated for a polymicrobial UTI and has had gradual improvement of her symptoms with plans  to discharge to a SNF.  Hospital Course by Problem:   Principal Problem:  Pre-syncope / dehydration / vertigo with nausea and vomiting / hearing loss / UTI  Troponins negative x3. Chest x-ray negative for active cardiopulmonary disease with incidental possible nodular opacity in the right lower lung. Symptoms thought to be multifactorial with dehydration from osmotic diuresis, recent evaluation by ENT for hearing loss treated with high-dose steroids, and UTI all contributory. Treated with 3 days of IV Cipro for UTI since the patient had significant nausea/vomiting on admission.  CT of the head negative for acute findings. MRI of the brain done 04/21/14, no acute findings to explain vertigo/hearing loss.  Continue Vestibular PT/OT. Active Problems:  History of breast cancer / lung nodule  Continue Aromasin.  Nodule noted on chest x-ray has been evaluated in the past the CT scan 09/26/13 of the chest showing stable small bilateral pulmonary nodules from old granulomatous disease or sarcoidosis. Chronic respiratory failure / Obstructive sleep apnea  Continue supplemental oxygen and CPAP each bedtime. Gout  Continue allopurinol and colchicine. COPD (chronic obstructive pulmonary disease)  Continue Symbicort and when necessary bronchodilators. Atrial fibrillation, persistent  Rate controlled on Cardizem. Anticoagulated with Elliquis. Chronic diastolic CHF (congestive heart failure)  Appears to be compensated. Diuretics held secondary to dehydration from hyperglycemia with presumed osmotic diuresis. May resume these at discharge. Hyperglycemia / type 2 diabetes  Glycemic control affected by recent steroid use.  Home regimen consisted of metformin and Lantus 28 units daily. CBGs 92-256. Continue Lantus 28 units daily, insulin resistant sliding scale and meal coverage of 9 units, with close  assessment of blood glucoses and adjustment of insulin therapy at discharge, since insulin requirement may  drop with cessation of steroids.  Hemoglobin A1c 9.6 corresponding to a mean plasma glucose of 229. Diabetes coordinator evaluated the patient 04/21/14.  Dietitian consultation for diet education was done while in the hospital.  Procedures:    None.   Medical Consultants:    None.   Discharge Exam:   Filed Vitals:   04/22/14 0605  BP: 119/78  Pulse: 80  Temp: 97.5 F (36.4 C)  Resp: 16   Filed Vitals:   04/21/14 1924 04/21/14 2153 04/22/14 0605 04/22/14 0747  BP:  109/59 119/78   Pulse:  75 80   Temp:  97.9 F (36.6 C) 97.5 F (36.4 C)   TempSrc:  Oral Oral   Resp:  20 16   Height:      Weight:      SpO2: 95% 97% 97% 98%    Gen:  NAD Cardiovascular:  HSIR, No M/R/G Respiratory: Lungs CTAB Gastrointestinal: Abdomen soft, NT/ND with normal active bowel sounds. Extremities: No C/E/C    Discharge Instructions:       Discharge Orders   Future Appointments Provider Department Dept Phone   05/01/2014 11:30 AM Adin Hector, MD [facility] [place]   08/12/2014 4:00 PM Deneise Lever, MD [facility] [phone]   09/18/2014 2:00 PM [facility] [phone]   09/18/2014 2:30 PM Deatra Robinson, MD [facility] [phone]   Future Orders Complete By Expires   Call MD for:  extreme fatigue  As directed    Call MD for:  persistant dizziness or light-headedness  As directed    Call MD for:  persistant nausea and vomiting  As directed    Call MD for:  temperature >100.4  As directed    Diet - low sodium heart healthy  As directed    Diet Carb Modified  As directed    Increase activity slowly  As directed    Walk with assistance  As directed    Walker   As directed        Medication List    STOP taking these medications       HUMULIN N 100 UNIT/ML injection  Generic drug:  insulin NPH Human      TAKE these medications       acetaminophen 500  MG tablet  Commonly known as:  TYLENOL  Take 1,000 mg by mouth every 6 (six) hours as needed. pain     alendronate 70 MG tablet  Commonly known as:  FOSAMAX  Take 1 tablet (70 mg total) by mouth once a week. Take with a full glass of water on an empty stomach.     allopurinol 100 MG tablet  Commonly known as:  ZYLOPRIM  Take 200 mg by mouth daily.     apixaban 5 MG Tabs tablet  Commonly known as:  ELIQUIS  Take 1 tablet (5 mg total) by mouth 2 (two) times daily.     colchicine 0.6 MG tablet  Take 1 tablet (0.6 mg total) by mouth 2 (two) times daily.     diltiazem 240 MG 24 hr capsule  Commonly known as:  CARDIZEM CD  Take 240 mg by mouth every evening.     exemestane 25 MG tablet  Commonly known as:  AROMASIN  Take 25 mg by mouth daily after breakfast.  ferrous fumarate 325 (106 FE) MG Tabs tablet  Commonly known as:  HEMOCYTE - 106 mg FE  Take 1 tablet by mouth 2 (two) times daily.     Fluticasone-Salmeterol 250-50 MCG/DOSE Aepb  Commonly known as:  ADVAIR  Inhale 1 puff into the lungs 2 (two) times daily.     furosemide 80 MG tablet  Commonly known as:  LASIX  - Take 80-160 mg by mouth 2 (two) times daily. 160mg  AM  - 80mg  PM     guaiFENesin 600 MG 12 hr tablet  Commonly known as:  MUCINEX  Take 1,200 mg by mouth 2 (two) times daily as needed for cough.     insulin aspart 100 UNIT/ML injection  Commonly known as:  novoLOG  Inject 0-20 Units into the skin 3 (three) times daily with meals.     insulin aspart 100 UNIT/ML injection  Commonly known as:  novoLOG  Inject 0-5 Units into the skin at bedtime.     insulin aspart 100 UNIT/ML injection  Commonly known as:  novoLOG  Inject 9 Units into the skin 3 (three) times daily with meals.     insulin glargine 100 UNIT/ML injection  Commonly known as:  LANTUS  Inject 0.28 mLs (28 Units total) into the skin at bedtime.     ipratropium 0.02 % nebulizer solution  Commonly known as:  ATROVENT  Take 2.5 mLs (0.5  mg total) by nebulization every 6 (six) hours.     levalbuterol 45 MCG/ACT inhaler  Commonly known as:  XOPENEX HFA  Inhale 2 puffs into the lungs every 6 (six) hours as needed for wheezing.     LORazepam 0.5 MG tablet  Commonly known as:  ATIVAN  Take one tablet by mouth every night at bedtime as needed for anxiety     losartan 50 MG tablet  Commonly known as:  COZAAR  Take 1 tablet by mouth daily.     meclizine 25 MG tablet  Commonly known as:  ANTIVERT  Take 25 mg by mouth 3 (three) times daily as needed for dizziness.     metFORMIN 500 MG tablet  Commonly known as:  GLUCOPHAGE  Take 500 mg by mouth 2 (two) times daily with a meal. If cbg is greater than 120 pt takes medication     metolazone 5 MG tablet  Commonly known as:  ZAROXOLYN  Take 0.5 tablets (2.5 mg total) by mouth daily. Take 30 minutes prior to AM dose of lasix     multivitamin with minerals Tabs tablet  Take 1 tablet by mouth daily.     nystatin 100000 UNIT/GM Powd  Apply to yeast infection under breasts, lower abdominal skin folds.     spironolactone 25 MG tablet  Commonly known as:  ALDACTONE  Take 0.5 tablets (12.5 mg total) by mouth 2 (two) times daily.     torsemide 20 MG tablet  Commonly known as:  DEMADEX  Take 2 tablets (40 mg total) by mouth 2 (two) times daily.     vitamin C 1000 MG tablet  Take 1,000 mg by mouth daily.       Follow-up Information   Follow up with Shirline Frees, MD. Schedule an appointment as soon as possible for a visit in 2 weeks. [facility] followup)    Specialty:  Family Medicine   Contact information:   [place] [id number] [phone]        The results of significant diagnostics from this hospitalization (  including imaging, microbiology, ancillary and laboratory) are listed below for reference.     Significant Diagnostic Studies:   Radiographs: Dg Chest 2 View  04/17/2014   CLINICAL DATA:  Shortness of breath, cough  EXAM: CHEST   2 VIEW  COMPARISON:  DG CHEST 2 VIEW dated 03/25/2014; DG CHEST 2 VIEW dated 06/06/2013; CT ANGIO CHEST W/CM &/OR WO/CM dated 05/11/2013  FINDINGS: There is a nodular opacity projected over the right lower lung measuring 15 mm not delineated on the lateral view. There is mild nonspecific bilateral interstitial thickening, likely chronic. There is no focal parenchymal opacity, pleural effusion, or pneumothorax. The heart and mediastinal contours are unremarkable.  There is mild thoracic spine spondylosis.  IMPRESSION: No active cardiopulmonary disease.  There is a nodular opacity projected over the right lower lung measuring 15 mm not delineated on the lateral view. This may reflect a small pulmonary nodule versus artifact which is superficial to the patient. Follow up radiography is recommended.   Electronically Signed   By: Kathreen Devoid   On: 04/17/2014 20:42   Dg Chest 2 View  03/25/2014   CLINICAL DATA:  Dyspnea. Cough. History of right-sided breast cancer and hypertension. History of sarcoidosis.  EXAM: CHEST  2 VIEW  COMPARISON:  10/31/13  FINDINGS: Hyperinflation. Midline trachea. Mild cardiomegaly. Mediastinal adenopathy detailed on prior chest CT is not readily apparent. Resolution of right-sided pleural effusion since 10/31/2013. No pneumothorax. Clearing of lung bases. Diffuse peribronchial thickening.  IMPRESSION: Clearing of right-sided airspace disease and right pleural effusion.  Mild cardiomegaly with nonspecific interstitial thickening.   Electronically Signed   By: Abigail Miyamoto M.D.   On: 03/25/2014 13:51   Ct Head Wo Contrast  04/17/2014   CLINICAL DATA:  Dizziness.  Fell.  EXAM: CT HEAD WITHOUT CONTRAST  TECHNIQUE: Contiguous axial images were obtained from the base of the skull through the vertex without intravenous contrast.  COMPARISON:  Brain MRI 05/11/2012  FINDINGS: Mild age related cerebral atrophy, ventriculomegaly and periventricular white matter disease. Benign-appearing bilateral  basal ganglia calcifications. No extra-axial fluid collections. No CT findings for acute intracranial hemorrhage or hemispheric infarction. No mass lesions. The brainstem and cerebellum appear normal.  No acute skull fractures identified. Hyperostosis frontalis interna is noted. The paranasal sinuses and mastoid air cells are clear. The globes are intact.  IMPRESSION: Age related cerebral atrophy, ventriculomegaly and periventricular white matter disease.  No acute intracranial findings or skull fracture.   Electronically Signed   By: Kalman Jewels M.D.   On: 04/17/2014 18:16   Mr Jeri Cos QA Contrast  04/21/2014   CLINICAL DATA:  Dizziness and vertigo. Hearing loss. Sarcoidosis. Morbid obesity.  EXAM: MRI HEAD WITHOUT AND WITH CONTRAST  TECHNIQUE: Multiplanar, multiecho pulse sequences of the brain and surrounding structures were obtained without and with intravenous contrast.  CONTRAST:  16mL MULTIHANCE GADOBENATE DIMEGLUMINE 529 MG/ML IV SOLN  COMPARISON:  CT head 04/17/2014. Noncontrast MR 05/11/2012.  FINDINGS: The patient was unable to remain motionless for the exam. Small or subtle lesions could be overlooked.  No evidence for acute infarction, hemorrhage, mass lesion, hydrocephalus, or extra-axial fluid. Mild cerebral and cerebellar atrophy. Mild subcortical and periventricular T2 and FLAIR hyperintensities, likely chronic microvascular ischemic change. Pituitary, pineal, and cerebellar tonsils unremarkable. No upper cervical lesions. Post infusion, no abnormal enhancement of the brain or meninges.  Flow voids are maintained throughout the carotid, basilar, and vertebral arteries. There are no areas of chronic hemorrhage. Visualized calvarium, skull base, and upper cervical  osseous structures unremarkable. Scalp and extracranial soft tissues, orbits, sinuses, and mastoids show no acute process.  Moderate enhancement of exuberant hyperostosis frontalis interna is seen on axial postcontrast imaging. No  strong evidence for dural enhancement or base of skull enhancement to suggest neurosarcoidosis.  IMPRESSION: Atrophy and small vessel disease. No acute intracranial findings. No cause is seen for the patient's hearing loss or vertigo. No convincing findings for neurosarcoidosis.   Electronically Signed   By: Rolla Flatten M.D.   On: 04/21/2014 14:47    Labs:  Basic Metabolic Panel:  Recent Labs Lab 04/17/14 1747 04/18/14 0626 04/20/14 0407  NA 132* 138 136*  K 5.4* 4.4 4.7  CL 90* 98 95*  CO2 29 31 32  GLUCOSE 579* 159* 178*  BUN 51* 36* 24*  CREATININE 0.95 0.65 0.68  CALCIUM 9.1 8.9 8.8   GFR Estimated Creatinine Clearance: 73.4 ml/min (by C-G formula based on Cr of 0.68). Liver Function Tests:  Recent Labs Lab 04/17/14 1747  AST 17  ALT 24  ALKPHOS 245*  BILITOT 0.7  PROT 7.2  ALBUMIN 3.9   CBC:  Recent Labs Lab 04/17/14 1747 04/18/14 0626 04/20/14 0407  WBC 13.0* 12.9* 12.7*  NEUTROABS 11.5*  --   --   HGB 15.8* 15.0 14.1  HCT 46.0 44.2 43.0  MCV 84.9 84.8 86.5  PLT 202 166 149*   Cardiac Enzymes:  Recent Labs Lab 04/17/14 1747 04/18/14 04/18/14 0626 04/18/14 1214  TROPONINI <0.30 <0.30 <0.30 <0.30   CBG:  Recent Labs Lab 04/20/14 1127 04/20/14 1635 04/20/14 2147 04/21/14 0815 04/21/14 1127  GLUCAP 202* 256* 193* 92 175*   Microbiology Recent Results (from the past 240 hour(s))  URINE CULTURE     Status: None   Collection Time    04/17/14  8:57 PM      Result Value Ref Range Status   Specimen Description URINE, CLEAN CATCH   Final   Special Requests NONE   Final   Culture  Setup Time     Final   Value: 04/18/2014 01:52     Performed at [facility]     Final   Value: >=100,000 COLONIES/ML     Performed at [facility]   Culture     Final   Value: Multiple bacterial morphotypes present, none predominant. Suggest appropriate recollection if clinically indicated.     Performed at [facility]     Report Status 04/19/2014 FINAL   Final    Time coordinating discharge: 35 minutes.  SignedVenetia Maxon Rama  Pager [phone] [facility] 04/22/2014, 9:52 AM

## 2014-04-22 NOTE — Progress Notes (Signed)
RT placed patient on CPAP. Patient home setting is 9 cmH2O. Water chamber is filled with sterile water for humidification. Patient is tolerating well at this time. RT will monitor as needed

## 2014-04-22 NOTE — Progress Notes (Addendum)
Physical Therapy Treatment Patient Details Name: [name] MRN: [id number] DOB: 01-Jan-1941 Today's Date: 04/22/2014    History of Present Illness Pt admit with CHF and pre-syncope; PMX: HTN, O2 dependent, morbid obesity    PT Comments    Pt OOB in recliner.  Amb to BR and assisted with hygiene.  Amb in hallway total 52 feet however required 4 sitting rest breaks to complete.    Follow Up Recommendations  SNF     Equipment Recommendations       Recommendations for Other Services       Precautions / Restrictions Precautions Precautions: Fall Precaution Comments: O2 dependent Restrictions Weight Bearing Restrictions: No    Mobility  Bed Mobility               General bed mobility comments: Pt OOB in recliner  Transfers Overall transfer level: Needs assistance Equipment used: 4-wheeled walker Transfers: Sit to/from Stand Sit to Stand: Min guard;Min assist         General transfer comment: Min Guard assist and good safety tech and hand use to steady self.  Ambulation/Gait Ambulation/Gait assistance: +2 safety/equipment;Min assist Ambulation Distance (Feet): 52 Feet Assistive device: Rolling walker (2 wheeled) Gait Pattern/deviations: Step-to pattern;Step-through pattern;Trunk flexed Gait velocity: decreased      Stairs            Wheelchair Mobility    Modified Rankin (Stroke Patients Only)       Balance                                    Cognition                            Exercises      General Comments        Pertinent Vitals/Pain     Home Living                      Prior Function            PT Goals (current goals can now be found in the care plan section) Progress towards PT goals: Progressing toward goals    Frequency  Min 3X/week    PT Plan      Co-evaluation             End of Session Equipment Utilized During Treatment: Gait belt Activity Tolerance: Patient  limited by fatigue;Treatment limited secondary to medical complications (Comment) Patient left: in chair;with call bell/phone within reach     Time: 1357-1422 PT Time Calculation (min): 25 min  Charges:  $Gait Training: 8-22 mins $Therapeutic Activity: 8-22 mins                    G Codes:      Rica Koyanagi  PTA [facility]  Acute  Rehab Pager      [phone]

## 2014-04-22 NOTE — Progress Notes (Signed)
CSW continuing to follow for disposition planning.  CSW met with pt at bedside. CSW notified pt that [facility] was able to offer pt a bed. Pt accepting of bed offer. CSW discussed that [facility] is requesting pt have CPAP brought from home to facility. Pt agreeable to CSW contacting one of pt son's to request assistance with getting CPAP from pt home.   CSW contacted pt son, Anthony via telephone and left voice message. CSW contacted pt son, [name] via telephone. CSW discussed with pt son plans to transition to [facility] when medically ready and anticipation for tomorrow. CSW discussed that [facility] requesting pt bring CPAP from home. Pt son stated that he will be able to bring pt CPAP to [facility] once pt transitions to facility. Pt son questioned results of MRI and CSW notified pt RN of pt son request for MRI results.  CSW to continue to follow and facilitate pt discharge needs when pt medically ready for discharge.   Suzanna Kidd, MSW, LCSW Clinical Social Work [phone] 

## 2014-04-22 NOTE — Progress Notes (Addendum)
Progress Note   [name] CZY:[id number] DOB: May 19, 1941 DOA: 04/17/2014 PCP: Shirline Frees, MD   Brief Narrative:   [name] is an 73 y.o. female with a PMH of asthma, sarcoidosis, morbid obesity, hypertension, atrial fibrillation on chronic anticoagulation, chronic respiratory failure on 2 L of home oxygen therapy, obstructive sleep apnea on CPAP, type 2 diabetes, stage I breast cancer status post right lumpectomy/radiation treatment, recent evaluation by ENT for hearing loss managed with 60 mg of prednisone daily who was admitted 04/17/14 with a chief complaint of positional syncope/fall to the floor but without loss of consciousness or altered mental status. Upon initial evaluation in the ED, the patient was normotensive, blood glucose 579 without evidence of DKA. She is being treated for a polymicrobial UTI and has had gradual improvement of her symptoms with plans to discharge to a SNF tomorrow.  Assessment/Plan:   Principal Problem:   Pre-syncope / dehydration / vertigo / hearing loss  Troponins negative x3. Chest x-ray negative for active cardiopulmonary disease with incidental possible nodular opacity in the right lower lung.  Continue Cipro for UTI.  CT of the head negative for acute findings. MRI of the brain done 04/21/14, no acute findings to explain vertigo/hearing loss.  Continue Vestibular PT/OT. Active Problems:   History of breast cancer / lung nodule  Continue Aromasin.  Nodule noted on chest x-ray has been evaluated in the past the CT scan 09/26/13 of the chest showing stable small bilateral pulmonary nodules from old granulomatous disease or sarcoidosis.   Chronic respiratory failure / Obstructive sleep apnea  Continue supplemental oxygen and CPAP each bedtime.   Gout  Continue allopurinol and colchicine.   COPD (chronic obstructive pulmonary disease)  Continue Symbicort and when necessary bronchodilators.   Atrial fibrillation,  persistent  Rate controlled on Cardizem. Anticoagulated with Elliquis.   Chronic diastolic CHF (congestive heart failure)  Appears to be compensated. Diuretics held secondary to dehydration from hyperglycemia with presumed osmotic diuresis.   Hyperglycemia / type 2 diabetes  Glycemic control affected by recent steroid use.  Currently being managed with metformin, Lantus 28 units daily and moderate scale SSI. CBGs 92-256. Continue insulin resistant sliding scale and increase meal coverage to 9 units.  Hemoglobin A1c 9.6 corresponding to a mean plasma glucose of 229. Diabetes coordinator consulted for uncontrolled diabetes, which is likely being exacerbated by steroids.  Dietitian consultation requested.   DVT Prophylaxis  Anticoagulated on Eliquis.  Code Status: Full. Family Communication: No family currently at the bedside.  Updated son, Eileen Kangas, by telephone at [phone] 04/22/14. Disposition Plan: Home when stable.   IV Access:    Peripheral IV   Procedures:    None.   Medical Consultants:    None.   Other Consultants:    Physical therapy  Diabetes coordinator   Anti-Infectives:    Cipro 04/19/14--->  Subjective:   Sharon EMMILIA Larson continues to feel fatigued and lacks energy. No dyspnea or cough. Says her bowels are moving. Her dizziness/vertigo symptoms seem to have improved.  Objective:    Filed Vitals:   04/21/14 1704 04/21/14 1924 04/21/14 2153 04/22/14 0605  BP: 112/56  109/59 119/78  Pulse: 78  75 80  Temp:   97.9 F (36.6 C) 97.5 F (36.4 C)  TempSrc:   Oral Oral  Resp: 18  20 16   Height:      Weight:      SpO2: 100% 95% 97% 97%    Intake/Output Summary (Last  24 hours) at 04/22/14 0717 Last data filed at 04/22/14 0715  Gross per 24 hour  Intake   1000 ml  Output   1800 ml  Net   -800 ml    Exam: Gen:  NAD Cardiovascular:  HSIR, No M/R/G Respiratory:  Lungs CTAB Gastrointestinal:  Abdomen soft, NT/ND, + BS Extremities:   No C/E/C, varicose veins   Data Reviewed:    Labs: Basic Metabolic Panel:  Recent Labs Lab 04/17/14 1747 04/18/14 0626 04/20/14 0407  NA 132* 138 136*  K 5.4* 4.4 4.7  CL 90* 98 95*  CO2 29 31 32  GLUCOSE 579* 159* 178*  BUN 51* 36* 24*  CREATININE 0.95 0.65 0.68  CALCIUM 9.1 8.9 8.8   GFR Estimated Creatinine Clearance: 73.4 ml/min (by C-G formula based on Cr of 0.68). Liver Function Tests:  Recent Labs Lab 04/17/14 1747  AST 17  ALT 24  ALKPHOS 245*  BILITOT 0.7  PROT 7.2  ALBUMIN 3.9   CBC:  Recent Labs Lab 04/17/14 1747 04/18/14 0626 04/20/14 0407  WBC 13.0* 12.9* 12.7*  NEUTROABS 11.5*  --   --   HGB 15.8* 15.0 14.1  HCT 46.0 44.2 43.0  MCV 84.9 84.8 86.5  PLT 202 166 149*   Cardiac Enzymes:  Recent Labs Lab 04/17/14 1747 04/18/14 04/18/14 0626 04/18/14 1214  TROPONINI <0.30 <0.30 <0.30 <0.30   BNP (last 3 results)  Recent Labs  01/24/14 1945 01/30/14 0523 03/20/14 1537  PROBNP 665.4* 404.5* 1011.00*   CBG:  Recent Labs Lab 04/20/14 1127 04/20/14 1635 04/20/14 2147 04/21/14 0815 04/21/14 1127  GLUCAP 202* 256* 193* 92 175*   Sepsis Labs:  Recent Labs Lab 04/17/14 1747 04/18/14 0626 04/20/14 0407  WBC 13.0* 12.9* 12.7*   Microbiology Recent Results (from the past 240 hour(s))  URINE CULTURE     Status: None   Collection Time    04/17/14  8:57 PM      Result Value Ref Range Status   Specimen Description URINE, CLEAN CATCH   Final   Special Requests NONE   Final   Culture  Setup Time     Final   Value: 04/18/2014 01:52     Performed at [facility]     Final   Value: >=100,000 COLONIES/ML     Performed at [facility]   Culture     Final   Value: Multiple bacterial morphotypes present, none predominant. Suggest appropriate recollection if clinically indicated.     Performed at [facility]   Report Status 04/19/2014 FINAL   Final     Radiographs/Studies:   Dg Chest  2 View  04/17/2014   CLINICAL DATA:  Shortness of breath, cough  EXAM: CHEST  2 VIEW  COMPARISON:  DG CHEST 2 VIEW dated 03/25/2014; DG CHEST 2 VIEW dated 06/06/2013; CT ANGIO CHEST W/CM &/OR WO/CM dated 05/11/2013  FINDINGS: There is a nodular opacity projected over the right lower lung measuring 15 mm not delineated on the lateral view. There is mild nonspecific bilateral interstitial thickening, likely chronic. There is no focal parenchymal opacity, pleural effusion, or pneumothorax. The heart and mediastinal contours are unremarkable.  There is mild thoracic spine spondylosis.  IMPRESSION: No active cardiopulmonary disease.  There is a nodular opacity projected over the right lower lung measuring 15 mm not delineated on the lateral view. This may reflect a small pulmonary nodule versus artifact which is superficial to the patient. Follow up radiography is  recommended.   Electronically Signed   By: Kathreen Devoid   On: 04/17/2014 20:42   Ct Head Wo Contrast  04/17/2014   CLINICAL DATA:  Dizziness.  Fell.  EXAM: CT HEAD WITHOUT CONTRAST  TECHNIQUE: Contiguous axial images were obtained from the base of the skull through the vertex without intravenous contrast.  COMPARISON:  Brain MRI 05/11/2012  FINDINGS: Mild age related cerebral atrophy, ventriculomegaly and periventricular white matter disease. Benign-appearing bilateral basal ganglia calcifications. No extra-axial fluid collections. No CT findings for acute intracranial hemorrhage or hemispheric infarction. No mass lesions. The brainstem and cerebellum appear normal.  No acute skull fractures identified. Hyperostosis frontalis interna is noted. The paranasal sinuses and mastoid air cells are clear. The globes are intact.  IMPRESSION: Age related cerebral atrophy, ventriculomegaly and periventricular white matter disease.  No acute intracranial findings or skull fracture.   Electronically Signed   By: Kalman Jewels M.D.   On: 04/17/2014 18:16    Medications:    . allopurinol  200 mg Oral Daily  . apixaban  5 mg Oral BID  . ciprofloxacin  400 mg Intravenous Q12H  . colchicine  0.6 mg Oral BID  . diltiazem  240 mg Oral QPM  . docusate sodium  100 mg Oral BID  . exemestane  25 mg Oral QPC breakfast  . ferrous fumarate  1 tablet Oral BID  . insulin aspart  0-20 Units Subcutaneous TID WC  . insulin aspart  0-5 Units Subcutaneous QHS  . insulin aspart  8 Units Subcutaneous TID WC  . insulin glargine  28 Units Subcutaneous QHS  . ipratropium  0.5 mg Nebulization BID  . levalbuterol  0.63 mg Nebulization BID  . metFORMIN  500 mg Oral BID WC  . mometasone-formoterol  2 puff Inhalation BID  . multivitamin with minerals  1 tablet Oral Daily  . nystatin   Topical TID  . predniSONE  5 mg Oral Q breakfast  . senna  1 tablet Oral BID  . vitamin C  1,000 mg Oral Daily   Continuous Infusions:    Time spent: 25 minutes.    LOS: 5 days   [place]  [facility] Pager [phone]. If unable to reach me by pager, please call my cell phone at [phone].  *Please refer to [email or web address], password [email or web address] to get updated schedule on who will round on this patient, as hospitalists switch teams weekly. If 7PM-7AM, please contact night-coverage at [email or web address], password [email or web address] for any overnight needs.  04/22/2014, 7:17 AM    **Disclaimer: This note was dictated with voice recognition software. Similar sounding words can inadvertently be transcribed and this note may contain transcription errors which may not have been corrected upon publication of note.**

## 2014-04-22 NOTE — Discharge Instructions (Signed)
Hyperglycemia °Hyperglycemia occurs when the glucose (sugar) in your blood is too high. Hyperglycemia can happen for many reasons, but it most often happens to people who do not know they have diabetes or are not managing their diabetes properly.  °CAUSES  °Whether you have diabetes or not, there are other causes of hyperglycemia. Hyperglycemia can occur when you have diabetes, but it can also occur in other situations that you might not be as aware of, such as: °Diabetes °· If you have diabetes and are having problems controlling your blood glucose, hyperglycemia could occur because of some of the following reasons: °· Not following your meal plan. °· Not taking your diabetes medications or not taking it properly. °· Exercising less or doing less activity than you normally do. °· Being sick. °Pre-diabetes °· This cannot be ignored. Before people develop Type 2 diabetes, they almost always have "pre-diabetes." This is when your blood glucose levels are higher than normal, but not yet high enough to be diagnosed as diabetes. Research has shown that some long-term damage to the body, especially the heart and circulatory system, may already be occurring during pre-diabetes. If you take action to manage your blood glucose when you have pre-diabetes, you may delay or prevent Type 2 diabetes from developing. °Stress °· If you have diabetes, you may be "diet" controlled or on oral medications or insulin to control your diabetes. However, you may find that your blood glucose is higher than usual in the hospital whether you have diabetes or not. This is often referred to as "stress hyperglycemia." Stress can elevate your blood glucose. This happens because of hormones put out by the body during times of stress. If stress has been the cause of your high blood glucose, it can be followed regularly by your caregiver. That way he/she can make sure your hyperglycemia does not continue to get worse or progress to  diabetes. °Steroids °· Steroids are medications that act on the infection fighting system (immune system) to block inflammation or infection. One side effect can be a rise in blood glucose. Most people can produce enough extra insulin to allow for this rise, but for those who cannot, steroids make blood glucose levels go even higher. It is not unusual for steroid treatments to "uncover" diabetes that is developing. It is not always possible to determine if the hyperglycemia will go away after the steroids are stopped. A special blood test called an A1c is sometimes done to determine if your blood glucose was elevated before the steroids were started. °SYMPTOMS °· Thirsty. °· Frequent urination. °· Dry mouth. °· Blurred vision. °· Tired or fatigue. °· Weakness. °· Sleepy. °· Tingling in feet or leg. °DIAGNOSIS  °Diagnosis is made by monitoring blood glucose in one or all of the following ways: °· A1c test. This is a chemical found in your blood. °· Fingerstick blood glucose monitoring. °· Laboratory results. °TREATMENT  °First, knowing the cause of the hyperglycemia is important before the hyperglycemia can be treated. Treatment may include, but is not be limited to: °· Education. °· Change or adjustment in medications. °· Change or adjustment in meal plan. °· Treatment for an illness, infection, etc. °· More frequent blood glucose monitoring. °· Change in exercise plan. °· Decreasing or stopping steroids. °· Lifestyle changes. °HOME CARE INSTRUCTIONS  °· Test your blood glucose as directed. °· Exercise regularly. Your caregiver will give you instructions about exercise. Pre-diabetes or diabetes which comes on with stress is helped by exercising. °· Eat wholesome,   balanced meals. Eat often and at regular, fixed times. Your caregiver or nutritionist will give you a meal plan to guide your sugar intake.  Being at an ideal weight is important. If needed, losing as little as 10 to 15 pounds may help improve blood  glucose levels. SEEK MEDICAL CARE IF:   You have questions about medicine, activity, or diet.  You continue to have symptoms (problems such as increased thirst, urination, or weight gain). SEEK IMMEDIATE MEDICAL CARE IF:   You are vomiting or have diarrhea.  Your breath smells fruity.  You are breathing faster or slower.  You are very sleepy or incoherent.  You have numbness, tingling, or pain in your feet or hands.  You have chest pain.  Your symptoms get worse even though you have been following your caregiver's orders.  If you have any other questions or concerns. Document Released: 05/24/2001 Document Revised: 02/20/2012 Document Reviewed: 03/26/2012 [facility] Patient Information 2014 [place], [place].  Vertigo Vertigo means you feel like you or your surroundings are moving when they are not. Vertigo can be dangerous if it occurs when you are at work, driving, or performing difficult activities.  CAUSES  Vertigo occurs when there is a conflict of signals sent to your brain from the visual and sensory systems in your body. There are many different causes of vertigo, including:  Infections, especially in the inner ear.  A bad reaction to a drug or misuse of alcohol and medicines.  Withdrawal from drugs or alcohol.  Rapidly changing positions, such as lying down or rolling over in bed.  A migraine headache.  Decreased blood flow to the brain.  Increased pressure in the brain from a head injury, infection, tumor, or bleeding. SYMPTOMS  You may feel as though the world is spinning around or you are falling to the ground. Because your balance is upset, vertigo can cause nausea and vomiting. You may have involuntary eye movements (nystagmus). DIAGNOSIS  Vertigo is usually diagnosed by physical exam. If the cause of your vertigo is unknown, your caregiver may perform imaging tests, such as an MRI scan (magnetic resonance imaging). TREATMENT  Most cases of vertigo resolve on  their own, without treatment. Depending on the cause, your caregiver may prescribe certain medicines. If your vertigo is related to body position issues, your caregiver may recommend movements or procedures to correct the problem. In rare cases, if your vertigo is caused by certain inner ear problems, you may need surgery. HOME CARE INSTRUCTIONS   Follow your caregiver's instructions.  Avoid driving.  Avoid operating heavy machinery.  Avoid performing any tasks that would be dangerous to you or others during a vertigo episode.  Tell your caregiver if you notice that certain medicines seem to be causing your vertigo. Some of the medicines used to treat vertigo episodes can actually make them worse in some people. SEEK IMMEDIATE MEDICAL CARE IF:   Your medicines do not relieve your vertigo or are making it worse.  You develop problems with talking, walking, weakness, or using your arms, hands, or legs.  You develop severe headaches.  Your nausea or vomiting continues or gets worse.  You develop visual changes.  A family member notices behavioral changes.  Your condition gets worse. MAKE SURE YOU:  Understand these instructions.  Will watch your condition.  Will get help right away if you are not doing well or get worse. Document Released: 09/07/2005 Document Revised: 02/20/2012 Document Reviewed: 06/16/2011 [facility] Patient Information 2014 [place].

## 2014-04-23 DIAGNOSIS — J690 Pneumonitis due to inhalation of food and vomit: Secondary | ICD-10-CM

## 2014-04-23 LAB — GLUCOSE, CAPILLARY
GLUCOSE-CAPILLARY: 82 mg/dL (ref 70–99)
GLUCOSE-CAPILLARY: 160 mg/dL — AB (ref 70–99)

## 2014-04-23 NOTE — Progress Notes (Signed)
Report was called to staff at [facility].

## 2014-04-23 NOTE — Progress Notes (Signed)
Pt is medically stable for discharge to SNF today. No acute overnight events. Please refer to discharge summary done 04/22/2014. Leisa Lenz [facility] [phone]

## 2014-04-23 NOTE — Discharge Summary (Signed)
Physician Discharge Summary  [name] TMH:[id number] DOB: Aug 05, 1941 DOA: 04/17/2014  PCP: Shirline Frees, MD  Admit date: 04/17/2014 Discharge date: 04/23/2014  Hospital Course:  Principal Problem:   Pre-syncope Active Problems:   Obstructive sleep apnea   Breast cancer, stage 1 s/p rt lumpectomy and radiation 2012   Gout   Nausea and vomiting   COPD (chronic obstructive pulmonary disease)   Chronic respiratory failure with hypoxia   Atrial fibrillation, persistent   Chronic diastolic CHF (congestive heart failure)   Diabetes mellitus, type II   Hyperglycemia   Hyponatremia   Hyperkalemia   Dehydration   Vertigo   UTI (urinary tract infection)   Recommendations for Outpatient Follow-Up:   1. Recommend close followup with outpatient glycemic control, insulin needs have gone up dramatically while on steroids, but should improve with steroid taper. 2. Recommend vestibular PT/OT. 3. The patient is on chronic oxygen therapy and CPAP each bedtime.    Discharge Condition: Improved.  Diet recommendation: Low sodium, heart healthy. Carbohydrate-modified.    History of Present Illness:   73 y.o. female with a PMH of asthma, sarcoidosis, morbid obesity, hypertension, atrial fibrillation on chronic anticoagulation, chronic respiratory failure on 2 L of home oxygen therapy, obstructive sleep apnea on CPAP, type 2 diabetes, stage I breast cancer status post right lumpectomy/radiation treatment, recent evaluation by ENT for hearing loss managed with 60 mg of prednisone daily who was admitted 04/17/14 with a chief complaint of positional syncope/fall to the floor but without loss of consciousness or altered mental status. Upon initial evaluation in the ED, the patient was found to have blood glucose 579 without evidence of DKA.  Principal Problem:  Pre-syncope / dehydration / vertigo with nausea and vomiting / hearing loss / UTI  Troponins negative x3. Chest x-ray negative for active  cardiopulmonary disease with incidental possible nodular opacity in the right lower lung. Symptoms thought to be multifactorial with dehydration from osmotic diuresis, recent evaluation by ENT for hearing loss treated with high-dose steroids, and UTI all contributory.  Treated with 3 days of IV Cipro for UTI since the patient had significant nausea/vomiting on admission.  CT of the head negative for acute findings. MRI of the brain done 04/21/14, no acute findings to explain vertigo/hearing loss.  Continue Vestibular PT/OT in SNF Active Problems:  History of breast cancer / lung nodule  Continue Aromasin.  Nodule noted on chest x-ray has been evaluated in the past the CT scan 09/26/13 of the chest showing stable small bilateral pulmonary nodules from old granulomatous disease or sarcoidosis. Chronic respiratory failure / Obstructive sleep apnea  Continue supplemental oxygen and CPAP each bedtime. Gout  Continue allopurinol and colchicine. COPD (chronic obstructive pulmonary disease)  Continue Symbicort and when necessary bronchodilators. Atrial fibrillation, persistent  Rate controlled on Cardizem. Anticoagulated with Elliquis. Chronic diastolic CHF (congestive heart failure)  Compensated. Diuretics held secondary to dehydration from hyperglycemia with presumed osmotic diuresis.  May resume these at discharge. Hyperglycemia / type 2 diabetes  Glycemic control affected by recent steroid use.  Home regimen consisted of metformin and Lantus 28 units daily. CBGs 92-256. Continue Lantus 28 units daily, insulin resistant sliding scale and meal coverage of 9 units, with close assessment of blood glucoses and adjustment of insulin therapy at discharge, since insulin requirement may drop with cessation of steroids.  Hemoglobin A1c 9.6 indicating poor glycemic control. Diabetes coordinator evaluated the patient 04/21/14.  Dietitian consultation for diet education was done while in the  hospital.  Procedures:   None. Medical Consultants:   None.   Signed:  Robbie Lis, MD  [facility] 04/23/2014, 11:47 AM  Pager #: [phone]   Discharge Exam: Filed Vitals:   04/23/14 0531  BP: 131/70  Pulse: 83  Temp: 97.4 F (36.3 C)  Resp: 16   Filed Vitals:   04/22/14 2004 04/22/14 2147 04/23/14 0531 04/23/14 0739  BP:  90/64 131/70   Pulse:  104 83   Temp:  97.8 F (36.6 C) 97.4 F (36.3 C)   TempSrc:  Oral Oral   Resp:  16 16   Height:      Weight:      SpO2: 94% 96% 97% 100%    General: Pt is alert, follows commands appropriately, not in acute distress Cardiovascular: irregular rhythm, S1/S2 appreciated  Respiratory: Clear to auscultation bilaterally, no wheezing, no crackles, no rhonchi Abdominal: Soft, non tender, non distended, bowel sounds +, no guarding Extremities: no edema, no cyanosis, pulses palpable bilaterally DP and PT Neuro: Grossly nonfocal  Discharge Instructions  Discharge Orders   Future Appointments Provider Department Dept Phone   05/01/2014 11:30 AM Adin Hector, MD [facility], [place]   08/12/2014 4:00 PM Deneise Lever, MD [facility] [phone]   09/18/2014 2:00 PM [facility] [phone]   09/18/2014 2:30 PM Deatra Robinson, MD [facility] [phone]   Future Orders Complete By Expires   Call MD for:  extreme fatigue  As directed    Call MD for:  persistant dizziness or light-headedness  As directed    Call MD for:  persistant nausea and vomiting  As directed    Call MD for:  temperature >100.4  As directed    Diet - low sodium heart healthy  As directed    Diet Carb Modified  As directed    Increase activity slowly  As directed    Walk with assistance  As directed    Walker   As directed        Medication List    STOP taking these medications       HUMULIN N 100 UNIT/ML injection   Generic drug:  insulin NPH Human      TAKE these medications       acetaminophen 500 MG tablet  Commonly known as:  TYLENOL  Take 1,000 mg by mouth every 6 (six) hours as needed. pain     alendronate 70 MG tablet  Commonly known as:  FOSAMAX  Take 1 tablet (70 mg total) by mouth once a week. Take with a full glass of water on an empty stomach.     allopurinol 100 MG tablet  Commonly known as:  ZYLOPRIM  Take 200 mg by mouth daily.     apixaban 5 MG Tabs tablet  Commonly known as:  ELIQUIS  Take 1 tablet (5 mg total) by mouth 2 (two) times daily.     colchicine 0.6 MG tablet  Take 1 tablet (0.6 mg total) by mouth 2 (two) times daily.     diltiazem 240 MG 24 hr capsule  Commonly known as:  CARDIZEM CD  Take 240 mg by mouth every evening.     exemestane 25 MG tablet  Commonly known as:  AROMASIN  Take 25 mg by mouth daily after breakfast.     ferrous fumarate 325 (106 FE) MG Tabs tablet  Commonly known as:  HEMOCYTE - 106  mg FE  Take 1 tablet by mouth 2 (two) times daily.     Fluticasone-Salmeterol 250-50 MCG/DOSE Aepb  Commonly known as:  ADVAIR  Inhale 1 puff into the lungs 2 (two) times daily.     furosemide 80 MG tablet  Commonly known as:  LASIX  - Take 80-160 mg by mouth 2 (two) times daily. 160mg  AM  - 80mg  PM     guaiFENesin 600 MG 12 hr tablet  Commonly known as:  MUCINEX  Take 1,200 mg by mouth 2 (two) times daily as needed for cough.     insulin aspart 100 UNIT/ML injection  Commonly known as:  novoLOG  Inject 0-20 Units into the skin 3 (three) times daily with meals.     insulin aspart 100 UNIT/ML injection  Commonly known as:  novoLOG  Inject 0-5 Units into the skin at bedtime.     insulin aspart 100 UNIT/ML injection  Commonly known as:  novoLOG  Inject 9 Units into the skin 3 (three) times daily with meals.     insulin glargine 100 UNIT/ML injection  Commonly known as:  LANTUS  Inject 0.28 mLs (28 Units total) into the skin at bedtime.      ipratropium 0.02 % nebulizer solution  Commonly known as:  ATROVENT  Take 2.5 mLs (0.5 mg total) by nebulization every 6 (six) hours.     levalbuterol 45 MCG/ACT inhaler  Commonly known as:  XOPENEX HFA  Inhale 2 puffs into the lungs every 6 (six) hours as needed for wheezing.     LORazepam 0.5 MG tablet  Commonly known as:  ATIVAN  Take one tablet by mouth every night at bedtime as needed for anxiety     losartan 50 MG tablet  Commonly known as:  COZAAR  Take 1 tablet by mouth daily.     meclizine 25 MG tablet  Commonly known as:  ANTIVERT  Take 25 mg by mouth 3 (three) times daily as needed for dizziness.     metFORMIN 500 MG tablet  Commonly known as:  GLUCOPHAGE  Take 500 mg by mouth 2 (two) times daily with a meal. If cbg is greater than 120 pt takes medication     metolazone 5 MG tablet  Commonly known as:  ZAROXOLYN  Take 0.5 tablets (2.5 mg total) by mouth daily. Take 30 minutes prior to AM dose of lasix     multivitamin with minerals Tabs tablet  Take 1 tablet by mouth daily.     nystatin 100000 UNIT/GM Powd  Apply to yeast infection under breasts, lower abdominal skin folds.     spironolactone 25 MG tablet  Commonly known as:  ALDACTONE  Take 0.5 tablets (12.5 mg total) by mouth 2 (two) times daily.     torsemide 20 MG tablet  Commonly known as:  DEMADEX  Take 2 tablets (40 mg total) by mouth 2 (two) times daily.     vitamin C 1000 MG tablet  Take 1,000 mg by mouth daily.           Follow-up Information   Follow up with Shirline Frees, MD. Schedule an appointment as soon as possible for a visit in 2 weeks. [facility] followup)    Specialty:  Family Medicine   Contact information:   [place] [address] [phone]        The results of significant diagnostics from this hospitalization (including imaging, microbiology, ancillary and laboratory) are listed below for reference.  Significant Diagnostic Studies: Dg  Chest 2 View  04/17/2014   CLINICAL DATA:  Shortness of breath, cough  EXAM: CHEST  2 VIEW  COMPARISON:  DG CHEST 2 VIEW dated 03/25/2014; DG CHEST 2 VIEW dated 06/06/2013; CT ANGIO CHEST W/CM &/OR WO/CM dated 05/11/2013  FINDINGS: There is a nodular opacity projected over the right lower lung measuring 15 mm not delineated on the lateral view. There is mild nonspecific bilateral interstitial thickening, likely chronic. There is no focal parenchymal opacity, pleural effusion, or pneumothorax. The heart and mediastinal contours are unremarkable.  There is mild thoracic spine spondylosis.  IMPRESSION: No active cardiopulmonary disease.  There is a nodular opacity projected over the right lower lung measuring 15 mm not delineated on the lateral view. This may reflect a small pulmonary nodule versus artifact which is superficial to the patient. Follow up radiography is recommended.   Electronically Signed   By: Kathreen Devoid   On: 04/17/2014 20:42   Dg Chest 2 View  03/25/2014   CLINICAL DATA:  Dyspnea. Cough. History of right-sided breast cancer and hypertension. History of sarcoidosis.  EXAM: CHEST  2 VIEW  COMPARISON:  10/31/13  FINDINGS: Hyperinflation. Midline trachea. Mild cardiomegaly. Mediastinal adenopathy detailed on prior chest CT is not readily apparent. Resolution of right-sided pleural effusion since 10/31/2013. No pneumothorax. Clearing of lung bases. Diffuse peribronchial thickening.  IMPRESSION: Clearing of right-sided airspace disease and right pleural effusion.  Mild cardiomegaly with nonspecific interstitial thickening.   Electronically Signed   By: Abigail Miyamoto M.D.   On: 03/25/2014 13:51   Ct Head Wo Contrast  04/17/2014   CLINICAL DATA:  Dizziness.  Fell.  EXAM: CT HEAD WITHOUT CONTRAST  TECHNIQUE: Contiguous axial images were obtained from the base of the skull through the vertex without intravenous contrast.  COMPARISON:  Brain MRI 05/11/2012  FINDINGS: Mild age related cerebral atrophy,  ventriculomegaly and periventricular white matter disease. Benign-appearing bilateral basal ganglia calcifications. No extra-axial fluid collections. No CT findings for acute intracranial hemorrhage or hemispheric infarction. No mass lesions. The brainstem and cerebellum appear normal.  No acute skull fractures identified. Hyperostosis frontalis interna is noted. The paranasal sinuses and mastoid air cells are clear. The globes are intact.  IMPRESSION: Age related cerebral atrophy, ventriculomegaly and periventricular white matter disease.  No acute intracranial findings or skull fracture.   Electronically Signed   By: Kalman Jewels M.D.   On: 04/17/2014 18:16   Mr Jeri Cos [id number] Contrast  04/21/2014   CLINICAL DATA:  Dizziness and vertigo. Hearing loss. Sarcoidosis. Morbid obesity.  EXAM: MRI HEAD WITHOUT AND WITH CONTRAST  TECHNIQUE: Multiplanar, multiecho pulse sequences of the brain and surrounding structures were obtained without and with intravenous contrast.  CONTRAST:  79mL MULTIHANCE GADOBENATE DIMEGLUMINE 529 MG/ML IV SOLN  COMPARISON:  CT head 04/17/2014. Noncontrast MR 05/11/2012.  FINDINGS: The patient was unable to remain motionless for the exam. Small or subtle lesions could be overlooked.  No evidence for acute infarction, hemorrhage, mass lesion, hydrocephalus, or extra-axial fluid. Mild cerebral and cerebellar atrophy. Mild subcortical and periventricular T2 and FLAIR hyperintensities, likely chronic microvascular ischemic change. Pituitary, pineal, and cerebellar tonsils unremarkable. No upper cervical lesions. Post infusion, no abnormal enhancement of the brain or meninges.  Flow voids are maintained throughout the carotid, basilar, and vertebral arteries. There are no areas of chronic hemorrhage. Visualized calvarium, skull base, and upper cervical osseous structures unremarkable. Scalp and extracranial soft tissues, orbits, sinuses, and mastoids show no acute process.  Moderate  enhancement of  exuberant hyperostosis frontalis interna is seen on axial postcontrast imaging. No strong evidence for dural enhancement or base of skull enhancement to suggest neurosarcoidosis.  IMPRESSION: Atrophy and small vessel disease. No acute intracranial findings. No cause is seen for the patient's hearing loss or vertigo. No convincing findings for neurosarcoidosis.   Electronically Signed   By: Rolla Flatten M.D.   On: 04/21/2014 14:47    Microbiology: Recent Results (from the past 240 hour(s))  URINE CULTURE     Status: None   Collection Time    04/17/14  8:57 PM      Result Value Ref Range Status   Specimen Description URINE, CLEAN CATCH   Final   Special Requests NONE   Final   Culture  Setup Time     Final   Value: 04/18/2014 01:52     Performed at [facility]     Final   Value: >=100,000 COLONIES/ML     Performed at [facility]   Culture     Final   Value: Multiple bacterial morphotypes present, none predominant. Suggest appropriate recollection if clinically indicated.     Performed at [facility]   Report Status 04/19/2014 FINAL   Final     Labs: Basic Metabolic Panel:  Recent Labs Lab 04/17/14 1747 04/18/14 0626 04/20/14 0407  NA 132* 138 136*  K 5.4* 4.4 4.7  CL 90* 98 95*  CO2 29 31 32  GLUCOSE 579* 159* 178*  BUN 51* 36* 24*  CREATININE 0.95 0.65 0.68  CALCIUM 9.1 8.9 8.8   Liver Function Tests:  Recent Labs Lab 04/17/14 1747  AST 17  ALT 24  ALKPHOS 245*  BILITOT 0.7  PROT 7.2  ALBUMIN 3.9   No results found for this basename: LIPASE, AMYLASE,  in the last 168 hours No results found for this basename: AMMONIA,  in the last 168 hours CBC:  Recent Labs Lab 04/17/14 1747 04/18/14 0626 04/20/14 0407  WBC 13.0* 12.9* 12.7*  NEUTROABS 11.5*  --   --   HGB 15.8* 15.0 14.1  HCT 46.0 44.2 43.0  MCV 84.9 84.8 86.5  PLT 202 166 149*   Cardiac Enzymes:  Recent Labs Lab 04/17/14 1747 04/18/14 04/18/14 0626  04/18/14 1214  TROPONINI <0.30 <0.30 <0.30 <0.30   BNP: BNP (last 3 results)  Recent Labs  01/24/14 1945 01/30/14 0523 03/20/14 1537  PROBNP 665.4* 404.5* 1011.00*   CBG:  Recent Labs Lab 04/22/14 1303 04/22/14 1733 04/22/14 2146 04/23/14 0753 04/23/14 1133  GLUCAP 182* 172* 205* 82 160*    Time coordinating discharge: Over 30 minutes

## 2014-04-23 NOTE — Progress Notes (Signed)
Occupational Therapy Treatment Patient Details Name: [name] MRN: [id number] DOB: 1940/12/23 Today's Date: 04/23/2014    History of present illness Pt admit with CHF and pre-syncope; PMX: HTN, O2 dependent, morbid obesity   OT comments  Pt making god progress with functional goals and should continue with acute OT services to increase level of function and safety. Pt state that she will be going to [facility] for rehab after acute care d/c  Follow Up Recommendations  Supervision/Assistance - 24 hour    Equipment Recommendations  None recommended by OT    Recommendations for Other Services      Precautions / Restrictions Precautions Precautions: Fall Precaution Comments: O2 dependent Restrictions Weight Bearing Restrictions: No       Mobility Bed Mobility Overal bed mobility: Needs Assistance Bed Mobility: Supine to Sit     Supine to sit: Supervision        Transfers Overall transfer level: Needs assistance Equipment used: Rolling walker (2 wheeled) Transfers: Sit to/from Stand Sit to Stand: Min guard              Balance Overall balance assessment: Needs assistance Sitting-balance support: No upper extremity supported;Feet supported Sitting balance-Leahy Scale: Good     Standing balance support: Bilateral upper extremity supported;Single extremity supported;During functional activity Standing balance-Leahy Scale: Fair                     ADL       Grooming: Wash/dry Radiographer, therapeutic: Min guard;Ambulation;RW;Grab bars;Regular Museum/gallery exhibitions officer and Hygiene: Sit to/from stand;Supervision/safety       Functional mobility during ADLs: Min guard;Rolling walker General ADL Comments: no c/o dizziness, O2 SATs 96-98%. Pt able to gather toiletry items, washcloths from tabletop and drawer in prep for ADLs. Pt using RW, and was able to plac items on bed/table  for use after toileting      Vision  wears glasses                              Cognition   Behavior During Therapy: [facility] for tasks assessed/performed Overall Cognitive Status: Within Functional Limits for tasks assessed                                                  General Comments  pt very pleasant and cooperative    Pertinent Vitals/ Pain       No c/o pain, VSS with O2 SATs 96-98%                                                          Frequency Min 2X/week     Progress Toward Goals  OT Goals(current goals can now be found in the care plan section)  Progress towards OT goals: Progressing toward goals  Acute Rehab OT Goals Patient Stated Goal: return to independent  Plan Discharge plan remains appropriate  End of Session Equipment Utilized During Treatment: Oxygen;Rolling walker   Activity Tolerance Patient tolerated treatment well   Patient Left in chair;with call bell/phone within reach             Time: 0939-1004 OT Time Calculation (min): 25 min  Charges: OT General Charges $OT Visit: 1 Procedure OT Treatments $Self Care/Home Management : 8-22 mins $Therapeutic Activity: 8-22 mins  Mosetta Putt 04/23/2014, 12:45 PM

## 2014-04-23 NOTE — Progress Notes (Signed)
Pt for discharge to [facility].  CSW facilitated pt discharge needs including contacting facility, faxing pt discharge information via [facility], discussing with pt at bedside, notifying pt sons via telephone, providing RN phone number to call report, and arranging ambulance transport for pt to [facility]. (Service Request ID#: [id number]).  No further social work needs identified at this time.  CSW signing off.   Alison Murray, MSW, [place] [employer] [phone]

## 2014-04-23 NOTE — Progress Notes (Signed)
RT placed patient on CPAP. Filled water chamber with sterile water for humidification. Patient tolerating well. RT will monitor as needed.

## 2014-04-24 ENCOUNTER — Non-Acute Institutional Stay (SKILLED_NURSING_FACILITY): Payer: Medicare Other | Admitting: Internal Medicine

## 2014-04-24 DIAGNOSIS — I4891 Unspecified atrial fibrillation: Secondary | ICD-10-CM

## 2014-04-24 DIAGNOSIS — I509 Heart failure, unspecified: Secondary | ICD-10-CM

## 2014-04-24 DIAGNOSIS — I5032 Chronic diastolic (congestive) heart failure: Secondary | ICD-10-CM

## 2014-04-24 DIAGNOSIS — E1059 Type 1 diabetes mellitus with other circulatory complications: Secondary | ICD-10-CM

## 2014-04-24 DIAGNOSIS — J449 Chronic obstructive pulmonary disease, unspecified: Secondary | ICD-10-CM

## 2014-04-26 NOTE — Progress Notes (Signed)
HISTORY & PHYSICAL  DATE: 04/24/2014   FACILITY: [facility]  LEVEL OF CARE: SNF (31)  ALLERGIES:  Allergies  Allergen Reactions  . Aspirin     Avoids due to being on blood thinners  . Contrast Media [Iodinated Diagnostic Agents] Nausea And Vomiting  . Ibuprofen     Avoids due to being on blood thinners  . Pravachol Other (See Comments)    Muscle pain  . Pravastatin Sodium     CHIEF COMPLAINT:  Manage COPD, atrial fibrillation and CHF  HISTORY OF PRESENT ILLNESS: Patient is a 73 year old Caucasian female who was hospitalized secondary to presyncope. After hospitalization she is admitted to this facility for short-term rehabilitation.  COPD: the COPD remains stable.  Pt denies sob, cough, wheezing or declining exercise tolerance.  No complications from the medications presently being used.  ATRIAL FIBRILLATION: the patients atrial fibrillation remains stable.  The patient denies DOE, tachycardia, orthopnea, transient neurological sx, palpitations, & PNDs.  No complications noted from the medications currently being used.  CHF:The patient does not relate significant weight changes, denies sob, DOE, orthopnea, PNDs, palpitations or chest pain.  CHF remains stable.  No complications form the medications being used. Complains of chronic lower extremity swelling.  PAST MEDICAL HISTORY :  Past Medical History  Diagnosis Date  . Asthma   . Bronchitis   . Hernia   . Anemia   . Sarcoidosis   . Morbid obesity   . Sarcoid   . Dyslipidemia   . Cough   . Wheezing   . Chills   . Constipation   . Bruises easily   . Gout attack     ankle, then wrist and hands  . Hypertension   . Atrial fibrillation, persistent 11/01/2013  . Chronic anticoagulation, on Eliquis 11/01/2013  . History of nuclear stress test 02/09/2010    dipyridamole; normal pattern of perfusion in all regions with attenuation artifact in inferior region, low risk   . Fracture of left  lower leg 06/1972    "no surgery; just casted it"  . CHF (congestive heart failure)   . Pneumonia 2012; 2014  . Chronic bronchitis   . OSA on CPAP   . Type II diabetes mellitus   . Uterine cancer     s/p hysterectomy  . Breast cancer, stage 1 10/17/2011    s/p right lumpectomy and XRT  . On home oxygen therapy     "2L prn" (01/24/2014)    PAST SURGICAL HISTORY: Past Surgical History  Procedure Laterality Date  . Total knee arthroplasty Bilateral 03/2010; 06/2011    left; right  . Abdominal hysterectomy  02/04/2005  . Umbilical hernia repair  02/04/2005  . Transthoracic echocardiogram  07/2008    EF, LV size is normal; RVSP normal; mod calcif of MV apparatus  . Breast biopsy Right 01/2011  . Breast lumpectomy Right 01/2011  . Tubal ligation  1970's  . Hernia repair      SOCIAL HISTORY:  reports that she has never smoked. She has never used smokeless tobacco. She reports that she does not drink alcohol or use illicit drugs.  FAMILY HISTORY:  Family History  Problem Relation Age of Onset  . Diabetes Mother   . Colon cancer Father   . Diabetes Brother   . Lung cancer Brother   . Diabetes Sister   . Cervical cancer Sister     CURRENT MEDICATIONS: Reviewed per MAR/see medication list  REVIEW  OF SYSTEMS:  See HPI otherwise 14 point ROS is negative.  PHYSICAL EXAMINATION  VS:  See VS section  GENERAL: no acute distress, morbidly obese body habitus EYES: conjunctivae normal, sclerae normal, normal eye lids MOUTH/THROAT: lips without lesions,no lesions in the mouth,tongue is without lesions,uvula elevates in midline NECK: supple, trachea midline, no neck masses, no thyroid tenderness, no thyromegaly LYMPHATICS: no LAN in the neck, no supraclavicular LAN RESPIRATORY: breathing is even & unlabored, BS CTAB CARDIAC: Heart rate is irregularly irregular, no murmur,no extra heart sounds, right lower extremity +2 edema & left lower extremity +1 edema GI:  ABDOMEN: abdomen soft,  normal BS, no masses, no tenderness  LIVER/SPLEEN: no hepatomegaly, no splenomegaly MUSCULOSKELETAL: HEAD: normal to inspection & palpation BACK: no kyphosis, scoliosis or spinal processes tenderness EXTREMITIES: LEFT UPPER EXTREMITY: full range of motion, normal strength & tone RIGHT UPPER EXTREMITY:  full range of motion, normal strength & tone LEFT LOWER EXTREMITY: Moderate range of motion, normal strength & tone RIGHT LOWER EXTREMITY:  Moderate range of motion, normal strength & tone PSYCHIATRIC: the patient is alert & oriented to person, affect & behavior appropriate  LABS/RADIOLOGY:  Labs reviewed: Basic Metabolic Panel:  Recent Labs  05/08/13 0458  05/18/13 1226 05/19/13 0505  04/17/14 1747 04/18/14 0626 04/20/14 0407  NA 141  < > 137 140  < > 132* 138 136*  K 2.9*  < > 4.0 4.0  < > 5.4* 4.4 4.7  CL 94*  < > 102 104  < > 90* 98 95*  CO2 39*  < > 27 33*  < > 29 31 32  GLUCOSE 251*  < > 166* 74  < > 579* 159* 178*  BUN 38*  < > 18 20  < > 51* 36* 24*  CREATININE 0.78  < > 0.76 0.88  < > 0.95 0.65 0.68  CALCIUM 9.3  < > 8.3* 7.7*  < > 9.1 8.9 8.8  MG 1.7  --   --  1.7  --   --   --   --   PHOS  --   --   --  2.3  --   --   --   --   < > = values in this interval not displayed. Liver Function Tests:  Recent Labs  10/31/13 2210 03/10/14 1329 04/17/14 1747  AST 18 15 17   ALT 10 11 24   ALKPHOS 91 136 245*  BILITOT 0.6 0.65 0.7  PROT 7.1 7.0 7.2  ALBUMIN 3.9 4.0 3.9    Recent Labs  05/03/13 1336  LIPASE 27   CBC:  Recent Labs  01/24/14 1945 03/10/14 1329 04/17/14 1747 04/18/14 0626 04/20/14 0407  WBC 8.4 7.3 13.0* 12.9* 12.7*  NEUTROABS 5.9 4.4 11.5*  --   --   HGB 13.5 14.5 15.8* 15.0 14.1  HCT 41.0 44.3 46.0 44.2 43.0  MCV 85.2 87.5 84.9 84.8 86.5  PLT 183 186 202 166 149*   Cardiac Enzymes:  Recent Labs  05/04/13 2009 08/18/13 2145  04/18/14 04/18/14 0626 04/18/14 1214  CKTOTAL  --  33  --   --   --   --   TROPONINI <0.30  --   < >  <0.30 <0.30 <0.30  < > = values in this interval not displayed.  CBG:  Recent Labs  04/22/14 2146 04/23/14 0753 04/23/14 1133  GLUCAP 205* 82 160*   Hemoglobin A1c 9.6  CT HEAD WITHOUT CONTRAST   TECHNIQUE: Contiguous axial images were obtained  from the base of the skull through the vertex without intravenous contrast.   COMPARISON:  Brain MRI 05/11/2012   FINDINGS: Mild age related cerebral atrophy, ventriculomegaly and periventricular white matter disease. Benign-appearing bilateral basal ganglia calcifications. No extra-axial fluid collections. No CT findings for acute intracranial hemorrhage or hemispheric infarction. No mass lesions. The brainstem and cerebellum appear normal.   No acute skull fractures identified. Hyperostosis frontalis interna is noted. The paranasal sinuses and mastoid air cells are clear. The globes are intact.   IMPRESSION: Age related cerebral atrophy, ventriculomegaly and periventricular white matter disease.   No acute intracranial findings or skull fracture.     CHEST  2 VIEW   COMPARISON:  DG CHEST 2 VIEW dated 03/25/2014; DG CHEST 2 VIEW dated 06/06/2013; CT ANGIO CHEST W/CM &/OR WO/CM dated 05/11/2013   FINDINGS: There is a nodular opacity projected over the right lower lung measuring 15 mm not delineated on the lateral view. There is mild nonspecific bilateral interstitial thickening, likely chronic. There is no focal parenchymal opacity, pleural effusion, or pneumothorax. The heart and mediastinal contours are unremarkable.   There is mild thoracic spine spondylosis.   IMPRESSION: No active cardiopulmonary disease.   There is a nodular opacity projected over the right lower lung measuring 15 mm not delineated on the lateral view. This may reflect a small pulmonary nodule versus artifact which is superficial to the patient. Follow up radiography is recommended. MRI HEAD WITHOUT AND WITH CONTRAST   TECHNIQUE: Multiplanar,  multiecho pulse sequences of the brain and surrounding structures were obtained without and with intravenous contrast.   CONTRAST:  43mL MULTIHANCE GADOBENATE DIMEGLUMINE 529 MG/ML IV SOLN   COMPARISON:  CT head 04/17/2014. Noncontrast MR 05/11/2012.   FINDINGS: The patient was unable to remain motionless for the exam. Small or subtle lesions could be overlooked.   No evidence for acute infarction, hemorrhage, mass lesion, hydrocephalus, or extra-axial fluid. Mild cerebral and cerebellar atrophy. Mild subcortical and periventricular T2 and FLAIR hyperintensities, likely chronic microvascular ischemic change. Pituitary, pineal, and cerebellar tonsils unremarkable. No upper cervical lesions. Post infusion, no abnormal enhancement of the brain or meninges.   Flow voids are maintained throughout the carotid, basilar, and vertebral arteries. There are no areas of chronic hemorrhage. Visualized calvarium, skull base, and upper cervical osseous structures unremarkable. Scalp and extracranial soft tissues, orbits, sinuses, and mastoids show no acute process.   Moderate enhancement of exuberant hyperostosis frontalis interna is seen on axial postcontrast imaging. No strong evidence for dural enhancement or base of skull enhancement to suggest neurosarcoidosis.   IMPRESSION: Atrophy and small vessel disease. No acute intracranial findings. No cause is seen for the patient's hearing loss or vertigo. No convincing findings for neurosarcoidosis.   ASSESSMENT/PLAN:  COPD-well compensated Atrial fibrillation-rate controlled CHF-compensated Diabetes mellitus-well controlled History of breast cancer-status post lumpectomy and radiation Check CBC with differential  I have reviewed patient's medical records received at admission/from hospitalization.  CPT CODE: 72536  Gayani Y Dasanayaka, [place] [phone]

## 2014-04-28 ENCOUNTER — Encounter: Payer: Self-pay | Admitting: *Deleted

## 2014-05-01 ENCOUNTER — Encounter (INDEPENDENT_AMBULATORY_CARE_PROVIDER_SITE_OTHER): Payer: Self-pay | Admitting: General Surgery

## 2014-05-01 ENCOUNTER — Ambulatory Visit (INDEPENDENT_AMBULATORY_CARE_PROVIDER_SITE_OTHER): Payer: Medicare Other | Admitting: General Surgery

## 2014-05-01 VITALS — BP 126/74 | HR 72 | Temp 97.8°F | Ht 59.0 in | Wt 272.0 lb

## 2014-05-01 DIAGNOSIS — C50919 Malignant neoplasm of unspecified site of unspecified female breast: Secondary | ICD-10-CM

## 2014-05-01 NOTE — Patient Instructions (Signed)
Examination of your breasts, skin, and all of the lymph node areas today is normal. Your recent mammograms are also normal. There is no evidence of breast cancer.  Continued to take the Aromasin pills until Dr. Humphrey Rolls tells you to stop. Most likely she will keep you on this for 5 years.  Return to see Dr. Dalbert Batman in one year, after you get your annual mammograms.

## 2014-05-01 NOTE — Progress Notes (Signed)
Patient ID: [name], female   DOB: 09/15/1941, 73 y.o.   MRN: [id number] History:  She returns for long-term followup regarding her right breast cancer. She has no complaints about her breast. She says the fungal dermatitis has resolved with antifungal cream. She feels no changes in her breasts.  Her breast cancer history is summarized below:  This 73 year old woman is now 20 months out from her right breast conservation surgery for breast cancer.  She underwent right partial mastectomy, reexcision margins, and sentinel node biopsy on November 09, 2011. Her final pathology showed a pathologic stage TI B., N0, receptor positive, HER-2 negative, Ki-67 8% tumor.  She went on to have radiation therapy and is now on Aromasin and being followed by Dr. Lovett Calender.   Mamm[facility] at [facility] in 03/25/2014 looked good, postop changes only. .  She remains on Coumadin. She is followed by Dr. Azalia Bilis for her atrial fibrillation. She is followed by Keturah Barre for her sarcoidosis. She was hospitalized last year for a variety of problems including pulmonary sarcoidosis, antral fibrillation, And right breast cellulitis. These problems have stabilized. She remains disabled in a wheelchair. She has numerous medical problems and is on numerous medication. She is wheelchair-bound on oxygen.   Exam:  Alert and pleasant. In a wheelchair with oxygen  Neck: No adenopathy or mass  Heart regular rate and rhythm. Does not sound like she is in atrial fibrillation today. No murmur  Lungs clear to auscultation no wheezes.  Breast right breast reveals well-healed scar right axilla and right upper outer quadrant. There is no cellulitis to the nipple is slightly inverted which is chronic. No abnormal skin. No dermatitis.  Assessment:  Fungal dermatitis right breast. Resolved. Invasive mammary carcinoma right breast, upper outer quadrant, stage TI B., N0, receptor positive, HER-2-negative, Ki-67 8%  No evidence of  recurrent cancer 26 months following right partial mastectomy, reexcision of margins, sentinel lymph node biopsy.  Recent bacterial cellulitis right breast, resolved   Plan:  Continue follow Dr. Marcy Panning, and continue to take her antiestrogen therapy  Repeat bilateral mammograms in Feb., 2016 and see me at that time    [facility]. Dalbert Batman, M.D., [facility]  General and Minimally invasive Surgery  Breast and Colorectal Surgery  Office: [phone]  Pager: [phone]

## 2014-05-11 NOTE — Assessment & Plan Note (Signed)
Rhythm feels very nearly regular on exam today

## 2014-05-11 NOTE — Assessment & Plan Note (Signed)
Weight loss recommended but she has never unable to sustain it and is not interested in bariatric surgery

## 2014-05-11 NOTE — Assessment & Plan Note (Signed)
Obesity and deconditioning would be sufficient explanation even if she did not have other health problems

## 2014-05-11 NOTE — Assessment & Plan Note (Signed)
Most consistent with obesity hypoventilation syndrome. Oxygen dependent

## 2014-05-16 ENCOUNTER — Encounter: Payer: Self-pay | Admitting: Adult Health

## 2014-05-16 ENCOUNTER — Non-Acute Institutional Stay (SKILLED_NURSING_FACILITY): Payer: Medicare Other | Admitting: Adult Health

## 2014-05-16 DIAGNOSIS — G4733 Obstructive sleep apnea (adult) (pediatric): Secondary | ICD-10-CM

## 2014-05-16 DIAGNOSIS — C50919 Malignant neoplasm of unspecified site of unspecified female breast: Secondary | ICD-10-CM

## 2014-05-16 DIAGNOSIS — J4489 Other specified chronic obstructive pulmonary disease: Secondary | ICD-10-CM

## 2014-05-16 DIAGNOSIS — E785 Hyperlipidemia, unspecified: Secondary | ICD-10-CM

## 2014-05-16 DIAGNOSIS — I509 Heart failure, unspecified: Secondary | ICD-10-CM

## 2014-05-16 DIAGNOSIS — E119 Type 2 diabetes mellitus without complications: Secondary | ICD-10-CM

## 2014-05-16 DIAGNOSIS — J449 Chronic obstructive pulmonary disease, unspecified: Secondary | ICD-10-CM

## 2014-05-16 DIAGNOSIS — I4891 Unspecified atrial fibrillation: Secondary | ICD-10-CM

## 2014-05-16 DIAGNOSIS — I5032 Chronic diastolic (congestive) heart failure: Secondary | ICD-10-CM

## 2014-05-16 DIAGNOSIS — M109 Gout, unspecified: Secondary | ICD-10-CM

## 2014-05-16 DIAGNOSIS — I10 Essential (primary) hypertension: Secondary | ICD-10-CM

## 2014-05-16 NOTE — Progress Notes (Signed)
Patient ID: [name], female   DOB: 04-Apr-1941, 73 y.o.   MRN: [id number]              PROGRESS NOTE  DATE: 05/16/2014   FACILITY: [facility]  LEVEL OF CARE: SNF (31)  Acute Visit  CHIEF COMPLAINT:  Discharge Notes  HISTORY OF PRESENT ILLNESS: This is a 73 year old female who is for discharge home with Home health PT, OT and Nursing. She has been admitted to [facility] on 04/23/14 from [facility] with Pre-syncope. Patient was admitted to this facility for short-term rehabilitation after the patient's recent hospitalization.  Patient has completed SNF rehabilitation and therapy has cleared the patient for discharge.  Reassessment of ongoing problem(s):  COPD: the COPD remains stable.  Pt denies sob, cough, wheezing or declining exercise tolerance.  No complications from the medications presently being used.  HTN: Pt 's HTN remains stable.  Denies CP, sob, DOE, pedal edema, headaches, dizziness or visual disturbances.  No complications from the medications currently being used.  Last BP : 115/55  DM:pt's DM remains stable.  Pt denies polyuria, polydipsia, polyphagia, changes in vision or hypoglycemic episodes.  No complications noted from the medication presently being used.   5/15 hemoglobin A1c is: 9.6  ATRIAL FIBRILLATION: the patients atrial fibrillation remains stable.  The patient denies DOE, tachycardia, orthopnea, transient neurological sx, pedal edema, palpitations, & PNDs.  No complications noted from the medications currently being used. Uses oxygen @ 3L/min via [place] continuously  GOUT: Patien'st  gout remains stable. Patient denies joint pan, redness, swelling or warmth. No complications reported from the medications presently being used.   PAST MEDICAL HISTORY : Reviewed.  No changes/see problem list  CURRENT MEDICATIONS: Reviewed per MAR/see medication list  REVIEW OF SYSTEMS:  GENERAL: no change in appetite, no fatigue, no weight  changes, no fever, chills or weakness RESPIRATORY: no cough, SOB, DOE, wheezing, hemoptysis CARDIAC: no chest pain, or palpitations, +edema GI: no abdominal pain, diarrhea, constipation, heart burn, nausea or vomiting  PHYSICAL EXAMINATION  GENERAL: no acute distress, morbidly obese EYES: conjunctivae normal, sclerae normal, normal eye lids NECK: supple, trachea midline, no neck masses, no thyroid tenderness, no thyromegaly LYMPHATICS: no LAN in the neck, no supraclavicular LAN RESPIRATORY: breathing is even & unlabored, BS CTAB CARDIAC: irregularly irregular, no murmur,no extra heart sounds, BLE edema 2+ GI: abdomen soft, normal BS, no masses, no tenderness, no hepatomegaly, no splenomegaly EXTREMITIES: able to move all 4 extremities, ambulates with walker PSYCHIATRIC: the patient is alert & oriented to person, affect & behavior appropriate  LABS/RADIOLOGY: 04/25/14  WBC 10.6 hemoglobin 14.3 hematocrit 43.6 Labs reviewed: Basic Metabolic Panel:  Recent Labs  05/18/13 1226 05/19/13 0505  04/17/14 1747 04/18/14 0626 04/20/14 0407  NA 137 140  < > 132* 138 136*  K 4.0 4.0  < > 5.4* 4.4 4.7  CL 102 104  < > 90* 98 95*  CO2 27 33*  < > 29 31 32  GLUCOSE 166* 74  < > 579* 159* 178*  BUN 18 20  < > 51* 36* 24*  CREATININE 0.76 0.88  < > 0.95 0.65 0.68  CALCIUM 8.3* 7.7*  < > 9.1 8.9 8.8  MG  --  1.7  --   --   --   --   PHOS  --  2.3  --   --   --   --   < > = values in this interval not displayed.  Liver Function Tests:  Recent Labs  10/31/13 2210 03/10/14 1329 04/17/14 1747  AST 18 15 17   ALT 10 11 24   ALKPHOS 91 136 245*  BILITOT 0.6 0.65 0.7  PROT 7.1 7.0 7.2  ALBUMIN 3.9 4.0 3.9    CBC:  Recent Labs  01/24/14 1945 03/10/14 1329 04/17/14 1747 04/18/14 0626 04/20/14 0407  WBC 8.4 7.3 13.0* 12.9* 12.7*  NEUTROABS 5.9 4.4 11.5*  --   --   HGB 13.5 14.5 15.8* 15.0 14.1  HCT 41.0 44.3 46.0 44.2 43.0  MCV 85.2 87.5 84.9 84.8 86.5  PLT 183 186 202 166 149*     Cardiac Enzymes:  Recent Labs  08/18/13 2145  04/18/14 04/18/14 0626 04/18/14 1214  CKTOTAL 33  --   --   --   --   TROPONINI  --   < > <0.30 <0.30 <0.30  < > = values in this interval not displayed.  CBG:  Recent Labs  04/22/14 2146 04/23/14 0753 04/23/14 1133  GLUCAP 205* 82 160*   CLINICAL DATA:  Dizziness.  Fell.   EXAM: CT HEAD WITHOUT CONTRAST   TECHNIQUE: Contiguous axial images were obtained from the base of the skull through the vertex without intravenous contrast.   COMPARISON:  Brain MRI 05/11/2012   FINDINGS: Mild age related cerebral atrophy, ventriculomegaly and periventricular white matter disease. Benign-appearing bilateral basal ganglia calcifications. No extra-axial fluid collections. No CT findings for acute intracranial hemorrhage or hemispheric infarction. No mass lesions. The brainstem and cerebellum appear normal.   No acute skull fractures identified. Hyperostosis frontalis interna is noted. The paranasal sinuses and mastoid air cells are clear. The globes are intact.   IMPRESSION: Age related cerebral atrophy, ventriculomegaly and periventricular white matter disease.   No acute intracranial findings or skull fracture. CLINICAL DATA:  Shortness of breath, cough   EXAM: CHEST  2 VIEW   COMPARISON:  DG CHEST 2 VIEW dated 03/25/2014; DG CHEST 2 VIEW dated 06/06/2013; CT ANGIO CHEST W/CM &/OR WO/CM dated 05/11/2013   FINDINGS: There is a nodular opacity projected over the right lower lung measuring 15 mm not delineated on the lateral view. There is mild nonspecific bilateral interstitial thickening, likely chronic. There is no focal parenchymal opacity, pleural effusion, or pneumothorax. The heart and mediastinal contours are unremarkable.   There is mild thoracic spine spondylosis.   IMPRESSION: No active cardiopulmonary disease.   There is a nodular opacity projected over the right lower lung measuring 15 mm not delineated  on the lateral view. This may reflect a small pulmonary nodule versus artifact which is superficial to the patient. Follow up radiography is recommended.   CLINICAL DATA:  Dizziness and vertigo. Hearing loss. Sarcoidosis. Morbid obesity.   EXAM: MRI HEAD WITHOUT AND WITH CONTRAST   TECHNIQUE: Multiplanar, multiecho pulse sequences of the brain and surrounding structures were obtained without and with intravenous contrast.   CONTRAST:  63mL MULTIHANCE GADOBENATE DIMEGLUMINE 529 MG/ML IV SOLN   COMPARISON:  CT head 04/17/2014. Noncontrast MR 05/11/2012.   FINDINGS: The patient was unable to remain motionless for the exam. Small or subtle lesions could be overlooked.   No evidence for acute infarction, hemorrhage, mass lesion, hydrocephalus, or extra-axial fluid. Mild cerebral and cerebellar atrophy. Mild subcortical and periventricular T2 and FLAIR hyperintensities, likely chronic microvascular ischemic change. Pituitary, pineal, and cerebellar tonsils unremarkable. No upper cervical lesions. Post infusion, no abnormal enhancement of the brain or meninges.   Flow voids are maintained throughout the carotid, basilar,  and vertebral arteries. There are no areas of chronic hemorrhage. Visualized calvarium, skull base, and upper cervical osseous structures unremarkable. Scalp and extracranial soft tissues, orbits, sinuses, and mastoids show no acute process.   Moderate enhancement of exuberant hyperostosis frontalis interna is seen on axial postcontrast imaging. No strong evidence for dural enhancement or base of skull enhancement to suggest neurosarcoidosis.   IMPRESSION: Atrophy and small vessel disease. No acute intracranial findings. No cause is seen for the patient's hearing loss or vertigo. No convincing findings for neurosarcoidosis   ASSESSMENT/PLAN:  COPD - stable; continue Atrovent and Symbicort; oxygen at 3 L per minute via [place] Gout - continue colchicine and  allopurinol Atrial fibrillation - rate controlled; continue Cardizem and Eliquis Chronic diastolic CHF - stable; continue Zaroxolyn, Aldactone, torsemide and Lasix; check BMP Diabetes mellitus, type II - continue metformin, Lantus and Humalog History of breast cancer, right status post lumpectomy - continue Aromasin Hypertension - well controlled; continue losartan Obstructive sleep apnea - on CPAP at bedtime    I have filled out patient's discharge paperwork and written prescriptions.  Patient will receive home health PT, OT and Nursing.  Total discharge time: Greater than 30 minutes Discharge time involved coordination of the discharge process with social worker, nursing staff and therapy department. Medical justification for home health services verified.  CPT CODE: 17915  Seth Bake - NP [facility] [phone]

## 2014-05-30 ENCOUNTER — Encounter: Payer: Self-pay | Admitting: Internal Medicine

## 2014-06-02 ENCOUNTER — Ambulatory Visit: Payer: Medicare Other | Admitting: Internal Medicine

## 2014-08-12 ENCOUNTER — Ambulatory Visit: Payer: Medicare Other | Admitting: Internal Medicine

## 2014-08-12 ENCOUNTER — Encounter: Payer: Self-pay | Admitting: Internal Medicine

## 2014-08-12 VITALS — BP 132/80 | HR 97 | Ht 59.0 in | Wt 268.0 lb

## 2014-08-12 DIAGNOSIS — R918 Other nonspecific abnormal finding of lung field: Secondary | ICD-10-CM

## 2014-08-12 MED ORDER — GUAIFENESIN-CODEINE 100-10 MG/5ML PO SYRP: 5.0000 mL | ORAL_SOLUTION | Freq: Three times a day (TID) | ORAL | Status: DC | PRN

## 2014-08-12 NOTE — Progress Notes (Signed)
Patient ID: [name], female    DOB: [birth year], 73 y.o.   MRN: [id number]  HPI  06/13/11- 79 yoF never smoker, hx of OSA on CPAP Hx Sarcoid,, PAF-chronic coumadin , Morbid obesity   Last here December 21, 2010 - Note reviewed Blames heat for lack energy. She also had right breast lumpectomy and XRT in February. Now pending right knee replacement next week.  Got good report at cardiology last week, following for her AFIb. She had stopped ritalin as ineffective for complaints of tiredness before.  Continues CPAPat 9 cwp.   08/05/2011 Follow up  Pt presents today for follow up of O2. PT recently had R. TKR on 06/20/11 w/ rehab stay. She did have some desaturations post op and was started on o2 at 2l /m . Per pt she was discharged to rehab on o2. She had swelling in right lower leg and had venous doppler that was neg for DVT. She is on chronic coumadin for hx of a fib. She says she does wear out easily and has DOE. Does fine at rest w/ no dyspnea.  Wants to see if she can get off O2. Today in office O2 sat at rest was 94%. Walking O2 sat is 92% w/out desaturations.  Denies chest pain or hemoptysis. Weight is down 6 lbs since last ov.  No increased cough or congestion  Wearing CPAP each night  She was discharge home on 08/02/11 . No records from rehab available at todays visit. Will attempt to obtain.   09/22/11-69 yoF never smoker, hx of OSA on CPAP Hx Sarcoid,, PAF-chronic coumadin , Morbid obesity She is staying on her oxygen most of the time. Consider comfortably on room air for a while at rest is is it for sleep and exertion. She is having difficulty getting around with a walker and managing her oxygen tank. Her home care company says it does not have a small portable type that would work for her. She continues her CPAP every night/Advanced. Says she has lost 22 pounds since her knee replacement surgery.  1//3/12- - 69 yoF never smoker, hx of OSA on CPAP Hx Sarcoid,, PAF-chronic coumadin ,  Morbid obesity Still having SOB, slight wheezing at times. cough-productive-clear in color; denies any fever and chills She got portable oxygen/Advanced. Comfortable with her CPAP and using it every night 3 at Has felt sick since Thanksgiving. Now she just doesn't feel she can clear her airways of mucus. She took 2 rounds of antibiotics. Using her nebulizer machine.  05/04/12- 20 yoF never smoker, hx of OSA on CPAP Hx Sarcoid,, PAF-chronic coumadin , Morbid obesity Breathing is fine as along as using O2 as needed Uses oxygen at 2 L for sleep and when needed. She may need to be requalified. Occasional minor wheeze in the mornings does not bother her enough to use her rescue inhaler.  11/02/12- 44 yoF never smoker, hx of OSA on CPAP Hx Sarcoid,, PAF-chronic coumadin , Morbid obesity FOLLOWS FOR: SOB (mostly with activity) and wheezing; Still using O2 as needed Has had flu vaccine. She is pleased to report no issues at all with no recent colds or respiratory events. She is trying to lose some weight. Using oxygen 2L/ Advanced for sleep and if needed.  06/06/13-71 yoF never smoker, hx of OSA on CPAP Hx Sarcoid,, PAFib-chronic coumadin , Morbid obesity Review of Systems-see HPI FOLLOWS FOR: Post hospital; Continues to have SOB-worse with heat Hospitalized with aspiration pneumonia and then with acute GI  bleed. Now denies abdominal pain, nausea obvious blood loss. She was left on theophylline. Still feels "washed out and tired" CT chest 05/11/13- IMPRESSION:  1. No evidence pulmonary embolism.  2. Right greater than left lower lobe predominant airspace  disease, most consistent with infection.  3. Scattered pulmonary nodules. Nonspecific, especially given the  clinical history of sarcoidosis and breast cancer. Consider short-  term CT follow-up at 3 months to exclude metastasis.  4. Thoracic adenopathy, which could be reactive or due to  sarcoidosis. Metastatic disease cannot be excluded. This  could  also be reevaluated at follow-up.  5. Small hiatal hernia.  6. 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  Original Report Authenticated By: Abigail Miyamoto, M.D  08/08/13- 65 yoF never smoker, hx of OSA on CPAP Hx Sarcoid,, PAF-chronic coumadin , Morbid obesity FOLLOWS FOR: stay indoors as much as possible due to heat; denies any wheezing, SOB, cough, or congestion. Nausea and vomiting 2 days ago and again this morning, starting after she was up and around. No blood or pain. We think aspiration caused the pneumonia for which she was hospitalized earlier this year. Oxygen 2 L/Advanced CPAP 9  10/31/13- 72 yoF never smoker, hx of OSA on CPAP Hx Sarcoid,, PAF-chronic coumadin , Morbid obesity Pt suspects pna.  Pt c/o nonprod cough, SOB, wheezing. She had right greater than left effusions on CT scan in October, addressed with diuretic. Describes cough off-and-on for 5 or 6 weeks, nonproductive. Nebulizer helps some. No fever or chest pain. Has had several rounds of antibiotics at [facility]. Continues CPAP 9 with oxygen 2 L/Advanced CT chest 09/26/13 IMPRESSION:  1. Stable small bilateral pulmonary nodules. Accordingly these were  not acute inflammatory nodules on the prior exam from May 2014.  Scattered pulmonary nodules were noted on the prior CT chest from  2002 which is not available for direct comparison. Accordingly,  these likely represent benign chronic nodules, possibly associated  with old granulomatous disease or sarcoidosis. It may be prudent to  continue to monitor with chest CT in 6-12 months.  2. New moderate right and small left pleural effusions with passive  atelectasis.  3. Lower lobe airway thickening bilaterally, causing luminal  narrowing and contributing to the underlying atelectasis in the lung  bases.  4. Skin thickening in the right breast with inverted nipple. Skin  thickening was also noted on the breast ultrasound 1 month  ago.  Differential diagnosis: Continued mastitis versus inflammatory  breast cancer. Correlate with any clinical improvement/clinical  signs. Biopsy may be warranted.  5. Mediastinal adenopathy is mostly stable, although an AP window  lymph node is slightly more prominent. This could be due to passive  congestion, sarcoidosis, or malignancy.  Electronically Signed  By: Sherryl Barters M.D.  On: 09/20/2013 14:33  01/31/14-  72 yoF never smoker, hx of OSA on CPAP Hx Sarcoid,, PAF-chronic coumadin , Morbid obesity FOLLOWS FOR:  Breathing doing well--No concerns today.  Wearing CPAP 9/ O2 2L/  6-8 hours per night Advanced. Hospitalized 2/13-19 - acute on chronic diastolic congestive heart failure. Still weak and tired. CXR 10/31/13 IMPRESSION:  1. Right pleural effusion.  2. Bibasilar atelectasis versus infiltrates.  Similar findings noted on recent chest CT of 09/20/2013. These  findings are new from prior chest x-ray of 08/18/2013 .  Electronically Signed  By: Marcello Moores Register  On: 10/31/2013 15:42  04/11/14- 56 yoF never smoker, hx of OSA on CPAP Hx Sarcoid,, PAF-chronic coumadin , Morbid obesity  FOLLOWS FOR:  Still having some cough (non-productive) and sob increased since last OV.  Wearing CPAP 9/ Advanced  6 hours per night              Son here Admits minor cough but feels she is doing okay. Admits a little daytime sleepiness but also says that is okay with her. She continues her sedentary lifestyle. No medication concerns or acute issues. CXR 03/25/14 IMPRESSION:  Clearing of right-sided airspace disease and right pleural effusion.  Mild cardiomegaly with nonspecific interstitial thickening.  Electronically Signed  By: Abigail Miyamoto M.D.  On: 03/25/2014 13:51  08/12/14-  20 yoF never smoker, hx of OSA on CPAP Hx Sarcoid, dCH, lung nodules,  complicated by  AF-chronic Eliquis, PHTN , Morbid obesity,  DM, hx breast CA, GERD/ hx asp.pneumonia CPAP 9/ Advanced all night, every night, O2  2L    CXR 04/17/14 IMPRESSION:  No active cardiopulmonary disease.  There is a nodular opacity projected over the right lower lung  measuring 15 mm not delineated on the lateral view. This may reflect  a small pulmonary nodule versus artifact which is superficial to the  patient. Follow up radiography is recommended.  Electronically Signed  By: Kathreen Devoid  On: 04/17/2014 20:42    ROS-see HPI Constitutional:  No--weight loss, no-night sweats, fevers, chills, +fatigue, lassitude. HEENT:   No-  headaches, difficulty swallowing, tooth/dental problems, sore throat,       No-  sneezing, itching, ear ache, nasal congestion, post nasal drip,  CV:  No-   chest pain, orthopnea, PND, swelling in lower extremities, anasarca,  dizziness, palpitations Resp: + shortness of breath with exertion, not at rest.              No-   productive cough, non-productive cough,  No-  coughing up of blood.              No-   change in color of mucus.  No- wheezing.   Skin: No-   rash or lesions. GI:  No-   heartburn, indigestion, abdominal pain, nausea, vomiting, GU:  MS:  No-   joint pain or swelling.   Neuro-  nothing unusual Psych:  No- change in mood or affect. No depression or anxiety.  No memory loss.  Objective:   Physical Exam General- Alert, Oriented, Affect-appropriate, Distress- none acute, morbidly obese, walker             portable oxygen 2L/ 95% Skin- looks pale Lymphadenopathy- none Head- atraumatic            Eyes- Gross vision intact, PERRLA, conjunctivae clear secretions            Ears- Hearing, canals-normal            Nose- Clear, no-Septal dev, mucus, polyps, erosion, perforation             Throat- Mallampati II , mucosa clear , drainage- none, tonsils- atrophic Neck- flexible , trachea midline, no stridor , thyroid nl, carotid no bruit Chest - symmetrical excursion , unlabored           Heart/CV- nearly regular with occasional skipped , no murmur , no gallop  , no rub, nl s1                 s2              - JVD- none , 1+ edema bilaterally/ elastic hose, stasis changes- none, varices-  none  Lung-  clear,  no- cough , dullness-none, rub- none           Chest wall-  Abd-  Br/ Gen/ Rectal- Not done, not indicated Extrem- +Walker Neuro- grossly intact to observation

## 2014-08-12 NOTE — Patient Instructions (Addendum)
Order- schedule CT chest no contrast  Dx lung nodules       (Please schedule after 10/ 16/ 15 for 1 year after last one comparison)  Script Cheratussin cough syrup  To use sparingly if needed for cough

## 2014-09-04 ENCOUNTER — Telehealth: Payer: Self-pay | Admitting: Oncology

## 2014-09-18 ENCOUNTER — Other Ambulatory Visit: Payer: Medicare Other

## 2014-09-18 ENCOUNTER — Ambulatory Visit: Payer: Medicare Other | Admitting: Oncology

## 2014-09-29 ENCOUNTER — Other Ambulatory Visit: Payer: Medicare Other

## 2014-09-29 ENCOUNTER — Ambulatory Visit (INDEPENDENT_AMBULATORY_CARE_PROVIDER_SITE_OTHER)
Admission: RE | Admit: 2014-09-29 | Discharge: 2014-09-29 | Disposition: A | Discharge status: Home / Self Care | Payer: Medicare Other | Source: Ambulatory Visit | Attending: Internal Medicine | Admitting: Internal Medicine

## 2014-09-29 DIAGNOSIS — R918 Other nonspecific abnormal finding of lung field: Secondary | ICD-10-CM

## 2014-10-01 ENCOUNTER — Other Ambulatory Visit: Payer: Self-pay

## 2014-10-01 DIAGNOSIS — C50919 Malignant neoplasm of unspecified site of unspecified female breast: Secondary | ICD-10-CM

## 2014-10-02 ENCOUNTER — Other Ambulatory Visit (HOSPITAL_BASED_OUTPATIENT_CLINIC_OR_DEPARTMENT_OTHER): Payer: Medicare Other

## 2014-10-02 ENCOUNTER — Ambulatory Visit (HOSPITAL_BASED_OUTPATIENT_CLINIC_OR_DEPARTMENT_OTHER): Payer: Medicare Other | Admitting: Hematology and Oncology

## 2014-10-02 VITALS — BP 135/67 | HR 100 | Temp 98.4°F | Resp 18 | Ht 59.0 in | Wt 271.3 lb

## 2014-10-02 DIAGNOSIS — C50311 Malignant neoplasm of lower-inner quadrant of right female breast: Secondary | ICD-10-CM

## 2014-10-02 DIAGNOSIS — C50919 Malignant neoplasm of unspecified site of unspecified female breast: Secondary | ICD-10-CM

## 2014-10-02 DIAGNOSIS — M810 Age-related osteoporosis without current pathological fracture: Secondary | ICD-10-CM | POA: Insufficient documentation

## 2014-10-02 DIAGNOSIS — D508 Other iron deficiency anemias: Secondary | ICD-10-CM

## 2014-10-02 LAB — CBC WITH DIFFERENTIAL/PLATELET
BASO%: 0.4 % (ref 0.0–2.0)
BASOS ABS: 0 10*3/uL (ref 0.0–0.1)
EOS%: 5.9 % (ref 0.0–7.0)
Eosinophils Absolute: 0.4 10*3/uL (ref 0.0–0.5)
HCT: 44.8 % (ref 34.8–46.6)
HEMOGLOBIN: 14.4 g/dL (ref 11.6–15.9)
LYMPH#: 1.7 10*3/uL (ref 0.9–3.3)
LYMPH%: 23 % (ref 14.0–49.7)
MCH: 27.5 pg (ref 25.1–34.0)
MCHC: 32 g/dL (ref 31.5–36.0)
MCV: 85.9 fL (ref 79.5–101.0)
MONO#: 0.6 10*3/uL (ref 0.1–0.9)
MONO%: 7.4 % (ref 0.0–14.0)
NEUT%: 63.3 % (ref 38.4–76.8)
NEUTROS ABS: 4.7 10*3/uL (ref 1.5–6.5)
Platelets: 184 10*3/uL (ref 145–400)
RBC: 5.22 10*6/uL (ref 3.70–5.45)
RDW: 15.8 % — AB (ref 11.2–14.5)
WBC: 7.5 10*3/uL (ref 3.9–10.3)

## 2014-10-02 LAB — COMPREHENSIVE METABOLIC PANEL (CC13)
ALBUMIN: 3.7 g/dL (ref 3.5–5.0)
ALK PHOS: 101 U/L (ref 40–150)
ALT: 21 U/L (ref 0–55)
AST: 18 U/L (ref 5–34)
Anion Gap: 8 mEq/L (ref 3–11)
BUN: 19.7 mg/dL (ref 7.0–26.0)
CALCIUM: 9.9 mg/dL (ref 8.4–10.4)
CHLORIDE: 103 meq/L (ref 98–109)
CO2: 30 mEq/L — ABNORMAL HIGH (ref 22–29)
Creatinine: 0.8 mg/dL (ref 0.6–1.1)
Glucose: 210 mg/dl — ABNORMAL HIGH (ref 70–140)
POTASSIUM: 4.3 meq/L (ref 3.5–5.1)
Sodium: 142 mEq/L (ref 136–145)
TOTAL PROTEIN: 6.5 g/dL (ref 6.4–8.3)
Total Bilirubin: 0.6 mg/dL (ref 0.20–1.20)

## 2014-10-02 NOTE — Progress Notes (Signed)
Patient Care Team: Shirline Frees, MD as PCP - General (Family Medicine)  DIAGNOSIS: No matching staging information was found for the patient.  SUMMARY OF ONCOLOGIC HISTORY:   Breast cancer of lower-inner quadrant of right female breast   12/28/2010 Initial Diagnosis Right breast needle biopsy: Invasive mammary cancer with extravasated mucin grade 1 invasive ductal carcinoma, ER 100%, PR present, Ki-67 80%, HER-2 negative ratio 1.06   02/08/2011 Surgery Right breast lumpectomy: Benign breast parenchyma with fibroadenomatoid nodules, 2 SLN negative, based on biopsy size tumor size 0.8 and 0.6 cm   03/28/2011 - 04/25/2011 Radiation Therapy Adjuvant radiation therapy to the breast   06/03/2011 -  Anti-estrogen oral therapy  Aromasin 25 mg daily: For osteoporosis patient is taking Fosamax    CHIEF COMPLIANT: Followup of breast cancer and iron deficiency anemia  INTERVAL HISTORY: [name] is a 73 year old Caucasian with above-mentioned history of right-sided breast cancer treated with lumpectomy followed by radiation therapy and she has been on Aromasin for the past 3 and half years. She is tolerating it extremely well without any major problems or concerns. Year ago she was admitted to the hospital with the cellulitis of the breast along with shortness of breath. At that time she was found to be anemic and was iron deficient. She overlying therapy subsequently for a short period. She has not been on any further oral iron pills. She reports that she uses oxygen 24 /7.  REVIEW OF SYSTEMS:   Constitutional: Denies fevers, chills or abnormal weight loss Eyes: Denies blurriness of vision Ears, nose, mouth, throat, and face: Denies mucositis or sore throat Respiratory: Chronic shortness of breath Cardiovascular: Denies palpitation, chest discomfort; lower extremity swelling positive Gastrointestinal:  Denies nausea, heartburn or change in bowel habits Skin: Denies abnormal skin rashes Lymphatics:  Denies new lymphadenopathy or easy bruising Neurological:Denies numbness, tingling or new weaknesses Behavioral/Psych: Mood is stable, no new changes  Breast:  denies any pain or lumps or nodules in either breasts All other systems were reviewed with the patient and are negative.  I have reviewed the past medical history, past surgical history, social history and family history with the patient and they are unchanged from previous note.  ALLERGIES:  is allergic to aspirin; contrast media; ibuprofen; pravachol; and pravastatin sodium.  MEDICATIONS:  Current Outpatient Prescriptions  Medication Sig Dispense Refill  . acetaminophen (TYLENOL) 500 MG tablet Take 1,000 mg by mouth every 6 (six) hours as needed. pain      . alendronate (FOSAMAX) 70 MG tablet Take 1 tablet (70 mg total) by mouth once a week. Take with a full glass of water on an empty stomach.  12 tablet  6  . allopurinol (ZYLOPRIM) 100 MG tablet Take 200 mg by mouth daily.       Marland Kitchen apixaban (ELIQUIS) 5 MG TABS tablet Take 1 tablet (5 mg total) by mouth 2 (two) times daily.  60 tablet  6  . Ascorbic Acid (VITAMIN C) 1000 MG tablet Take 1,000 mg by mouth daily.      . colchicine 0.6 MG tablet Take 1 tablet (0.6 mg total) by mouth 2 (two) times daily.  60 tablet  0  . diltiazem (CARDIZEM CD) 240 MG 24 hr capsule Take 240 mg by mouth every evening.       Marland Kitchen exemestane (AROMASIN) 25 MG tablet Take 25 mg by mouth daily after breakfast.      . Fluticasone-Salmeterol (ADVAIR) 250-50 MCG/DOSE AEPB Inhale 1 puff into the lungs 2 (two)  times daily.      . furosemide (LASIX) 80 MG tablet Take 80-160 mg by mouth 2 (two) times daily. 191m AM 815mPM      . guaiFENesin (MUCINEX) 600 MG 12 hr tablet Take 1,200 mg by mouth 2 (two) times daily as needed for cough.       . Marland KitchenuaiFENesin-codeine (ROBITUSSIN AC) 100-10 MG/5ML syrup Take 5 mLs by mouth 3 (three) times daily as needed for cough.  120 mL  0  . insulin aspart (NOVOLOG) 100 UNIT/ML injection  Inject 0-20 Units into the skin 3 (three) times daily with meals.  10 mL  11  . insulin glargine (LANTUS) 100 UNIT/ML injection Inject 0.28 mLs (28 Units total) into the skin at bedtime.  10 mL  12  . ipratropium (ATROVENT) 0.02 % nebulizer solution Take 2.5 mLs (0.5 mg total) by nebulization every 6 (six) hours.  75 mL  0  . levalbuterol (XOPENEX HFA) 45 MCG/ACT inhaler Inhale 2 puffs into the lungs every 6 (six) hours as needed for wheezing.  1 Inhaler  5  . LORazepam (ATIVAN) 0.5 MG tablet Take one tablet by mouth every night at bedtime as needed for anxiety  30 tablet  0  . losartan (COZAAR) 50 MG tablet Take 1 tablet by mouth daily.      . meclizine (ANTIVERT) 25 MG tablet Take 25 mg by mouth 3 (three) times daily as needed for dizziness.       . metFORMIN (GLUCOPHAGE) 500 MG tablet Take 500 mg by mouth 2 (two) times daily with a meal. If cbg is greater than 120 pt takes medication      . metolazone (ZAROXOLYN) 5 MG tablet Take 0.5 tablets (2.5 mg total) by mouth daily. Take 30 minutes prior to AM dose of lasix      . Multiple Vitamin (MULTIVITAMIN WITH MINERALS) TABS Take 1 tablet by mouth daily.      . Marland Kitchenystatin (MYCOSTATIN/NYSTOP) 100000 UNIT/GM POWD Apply to yeast infection under breasts, lower abdominal skin folds.    0  . spironolactone (ALDACTONE) 25 MG tablet Take 0.5 tablets (12.5 mg total) by mouth 2 (two) times daily.  30 tablet  11  . torsemide (DEMADEX) 20 MG tablet Take 2 tablets (40 mg total) by mouth 2 (two) times daily.  120 tablet  1   No current facility-administered medications for this visit.    PHYSICAL EXAMINATION: ECOG PERFORMANCE STATUS: 2 - Symptomatic, <50% confined to bed  Filed Vitals:   10/02/14 1325  BP: 135/67  Pulse: 100  Temp: 98.4 F (36.9 C)  Resp: 18   Filed Weights   10/02/14 1325  Weight: 271 lb 4.8 oz (123.061 kg)    GENERAL:alert, no distress and comfortable SKIN: skin color, texture, turgor are normal, no rashes or significant  lesions EYES: normal, Conjunctiva are pink and non-injected, sclera clear OROPHARYNX:no exudate, no erythema and lips, buccal mucosa, and tongue normal  NECK: supple, thyroid normal size, non-tender, without nodularity LYMPH:  no palpable lymphadenopathy in the cervical, axillary or inguinal LUNGS: Diminished breath sounds at the bases HEART: A. fib with lower extremity edema chronic ABDOMEN:abdomen soft, non-tender and normal bowel sounds Musculoskeletal:no cyanosis of digits and no clubbing  NEURO: alert & oriented x 3 with fluent speech, no focal motor/sensory deficits BREAST: Very large and pendulous breasts, No palpable masses or nodules in either right or left breasts. No palpable axillary supraclavicular or infraclavicular adenopathy no breast tenderness or nipple discharge.   LABORATORY DATA:  I have reviewed the data as listed   Chemistry      Component Value Date/Time   NA 142 10/02/2014 1302   NA 136* 04/20/2014 0407   K 4.3 10/02/2014 1302   K 4.7 04/20/2014 0407   CL 95* 04/20/2014 0407   CL 98 03/08/2013 0959   CO2 30* 10/02/2014 1302   CO2 32 04/20/2014 0407   BUN 19.7 10/02/2014 1302   BUN 24* 04/20/2014 0407   CREATININE 0.8 10/02/2014 1302   CREATININE 0.68 04/20/2014 0407   CREATININE 0.82 03/24/2014 1757      Component Value Date/Time   CALCIUM 9.9 10/02/2014 1302   CALCIUM 8.8 04/20/2014 0407   ALKPHOS 101 10/02/2014 1302   ALKPHOS 245* 04/17/2014 1747   AST 18 10/02/2014 1302   AST 17 04/17/2014 1747   ALT 21 10/02/2014 1302   ALT 24 04/17/2014 1747   BILITOT 0.60 10/02/2014 1302   BILITOT 0.7 04/17/2014 1747       Lab Results  Component Value Date   WBC 7.5 10/02/2014   HGB 14.4 10/02/2014   HCT 44.8 10/02/2014   MCV 85.9 10/02/2014   PLT 184 10/02/2014   NEUTROABS 4.7 10/02/2014     RADIOGRAPHIC STUDIES: I have personally reviewed the radiology reports and agreed with their findings. No results found.   ASSESSMENT & PLAN:  Breast cancer of  lower-inner quadrant of right female breast Stage I (T1 N0) invasive ductal carcinoma of the right breast status post lumpectomy followed by radiation. She was then begun on Aromasin 25 mg daily. Overall she's tolerating it well. Minimal side effects. She has no evidence of recurrent disease.  Surveillance: Patient mammogram April 2015. Today's breast exam did not reveal any abnormalities. I discussed with her that if her health issues worsen, we may stop surveillance for breast cancer. But the patient is interested in continuing the surveillance plan at this time. She will be set up for a mammogram in April 2016.  Anemia Prior history of iron deficiency anemia in September 2014 which was admitted to the hospital with cellulitis and severe shortness of breath. She'll remain on oral iron for a short period of time. She has not taken it for the last 6 months at least and is not iron deficient at this time so that instructed her to not initiate any further oral iron treatments. Begin continue to watch and monitor her blood counts every 6 months.  Osteoporosis Currently on Fosamax and calcium and vitamin D. I recommended that she get a bone density test in April 2016.   No orders of the defined types were placed in this encounter.   The patient has a good understanding of the overall plan. she agrees with it. She will call with any problems that may develop before her next visit here.  I spent 20 minutes counseling the patient face to face. The total time spent in the appointment was 25 minutes and more than 50% was on counseling and review of test results    Rulon Eisenmenger, MD 10/02/2014 2:19 PM

## 2014-10-02 NOTE — Assessment & Plan Note (Addendum)
Currently on Fosamax and calcium and vitamin D. I recommended that she get a bone density test in April 2016.

## 2014-10-02 NOTE — Assessment & Plan Note (Signed)
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 She was then begun on Aromasin 25 mg daily. Overall she's tolerating it well. Minimal side effects. She has no evidence of recurrent disease.  Surveillance: Patient mammogram April 2015. Today's breast exam did not reveal any abnormalities. I discussed with her that if her health issues worsen, we may stop surveillance for breast cancer. But the patient is interested in continuing the surveillance plan at this time. She will be set up for a mammogram in April 2016.

## 2014-10-02 NOTE — Assessment & Plan Note (Signed)
Prior history of iron deficiency anemia in September 2014 which was admitted to the hospital with cellulitis and severe shortness of breath. She'll remain on oral iron for a short period of time. She has not taken it for the last 6 months at least and is not iron deficient at this time so that instructed her to not initiate any further oral iron treatments. Begin continue to watch and monitor her blood counts every 6 months.

## 2014-10-03 ENCOUNTER — Telehealth: Payer: Self-pay | Admitting: Hematology and Oncology

## 2014-10-03 NOTE — Telephone Encounter (Signed)
Confirm appt d/t for April 2016. mailed cal. Also gave appt d/t for mammo/bone scan with # if time did not work.

## 2014-10-15 ENCOUNTER — Other Ambulatory Visit: Payer: Self-pay | Admitting: Internal Medicine

## 2014-10-22 ENCOUNTER — Other Ambulatory Visit: Payer: Self-pay | Admitting: Pharmacist Clinician (PhC)/ Clinical Pharmacy Specialist

## 2014-10-22 MED ORDER — APIXABAN 5 MG PO TABS: ORAL_TABLET | ORAL | Status: DC

## 2014-11-11 ENCOUNTER — Ambulatory Visit: Payer: Medicare Other | Admitting: Internal Medicine

## 2014-11-14 ENCOUNTER — Encounter: Payer: Self-pay | Admitting: Internal Medicine

## 2015-03-19 ENCOUNTER — Encounter: Payer: Self-pay | Admitting: Internal Medicine

## 2015-03-19 ENCOUNTER — Ambulatory Visit (INDEPENDENT_AMBULATORY_CARE_PROVIDER_SITE_OTHER)
Admission: RE | Admit: 2015-03-19 | Discharge: 2015-03-19 | Disposition: A | Discharge status: Home / Self Care | Payer: Medicare Other | Source: Ambulatory Visit | Attending: Internal Medicine | Admitting: Internal Medicine

## 2015-03-19 ENCOUNTER — Ambulatory Visit (INDEPENDENT_AMBULATORY_CARE_PROVIDER_SITE_OTHER): Payer: Medicare Other | Admitting: Internal Medicine

## 2015-03-19 VITALS — BP 128/82 | HR 98 | Ht 59.0 in | Wt 271.2 lb

## 2015-03-19 DIAGNOSIS — J690 Pneumonitis due to inhalation of food and vomit: Secondary | ICD-10-CM

## 2015-03-19 DIAGNOSIS — R911 Solitary pulmonary nodule: Secondary | ICD-10-CM

## 2015-03-19 DIAGNOSIS — J441 Chronic obstructive pulmonary disease with (acute) exacerbation: Secondary | ICD-10-CM | POA: Diagnosis not present

## 2015-03-19 DIAGNOSIS — J40 Bronchitis, not specified as acute or chronic: Secondary | ICD-10-CM | POA: Diagnosis not present

## 2015-03-19 DIAGNOSIS — J9611 Chronic respiratory failure with hypoxia: Secondary | ICD-10-CM | POA: Diagnosis not present

## 2015-03-19 DIAGNOSIS — E662 Morbid (severe) obesity with alveolar hypoventilation: Secondary | ICD-10-CM

## 2015-03-19 DIAGNOSIS — Z6841 Body Mass Index (BMI) 40.0 and over, adult: Secondary | ICD-10-CM

## 2015-03-19 MED ORDER — LEVALBUTEROL HCL 0.63 MG/3ML IN NEBU
0.6300 mg | INHALATION_SOLUTION | Freq: Once | RESPIRATORY_TRACT | Status: AC
  Administered 2015-03-19: 0.63 mg via RESPIRATORY_TRACT

## 2015-03-19 MED ORDER — METHYLPREDNISOLONE ACETATE 80 MG/ML IJ SUSP
80.0000 mg | Freq: Once | INTRAMUSCULAR | Status: AC
  Administered 2015-03-19: 80 mg via INTRAMUSCULAR

## 2015-03-19 NOTE — Assessment & Plan Note (Signed)
Acute exacerbation of asthmatic bronchitis. Suspect this may have been an aspiration event. Chest x-ray does not show active pneumonitis at this time. Plan-nebulizer treatment with Xopenex, Depo-Medrol.

## 2015-03-19 NOTE — Assessment & Plan Note (Signed)
Oxygen remains necessary

## 2015-03-19 NOTE — Progress Notes (Signed)
Patient ID: [name], female    DOB: [birth year], 74 y.o.   MRN: [id number]  HPI  06/13/11- 79 yoF never smoker, hx of OSA on CPAP Hx Sarcoid,, PAF-chronic coumadin , Morbid obesity   Last here December 21, 2010 - Note reviewed Blames heat for lack energy. She also had right breast lumpectomy and XRT in February. Now pending right knee replacement next week.  Got good report at cardiology last week, following for her AFIb. She had stopped ritalin as ineffective for complaints of tiredness before.  Continues CPAPat 9 cwp.   08/05/2011 Follow up  Pt presents today for follow up of O2. PT recently had R. TKR on 06/20/11 w/ rehab stay. She did have some desaturations post op and was started on o2 at 2l /m . Per pt she was discharged to rehab on o2. She had swelling in right lower leg and had venous doppler that was neg for DVT. She is on chronic coumadin for hx of a fib. She says she does wear out easily and has DOE. Does fine at rest w/ no dyspnea.  Wants to see if she can get off O2. Today in office O2 sat at rest was 94%. Walking O2 sat is 92% w/out desaturations.  Denies chest pain or hemoptysis. Weight is down 6 lbs since last ov.  No increased cough or congestion  Wearing CPAP each night  She was discharge home on 08/02/11 . No records from rehab available at todays visit. Will attempt to obtain.   09/22/11-69 yoF never smoker, hx of OSA on CPAP Hx Sarcoid,, PAF-chronic coumadin , Morbid obesity She is staying on her oxygen most of the time. Consider comfortably on room air for a while at rest is is it for sleep and exertion. She is having difficulty getting around with a walker and managing her oxygen tank. Her home care company says it does not have a small portable type that would work for her. She continues her CPAP every night/Advanced. Says she has lost 22 pounds since her knee replacement surgery.  1//3/12- - 69 yoF never smoker, hx of OSA on CPAP Hx Sarcoid,, PAF-chronic coumadin ,  Morbid obesity Still having SOB, slight wheezing at times. cough-productive-clear in color; denies any fever and chills She got portable oxygen/Advanced. Comfortable with her CPAP and using it every night 3 at Has felt sick since Thanksgiving. Now she just doesn't feel she can clear her airways of mucus. She took 2 rounds of antibiotics. Using her nebulizer machine.  05/04/12- 20 yoF never smoker, hx of OSA on CPAP Hx Sarcoid,, PAF-chronic coumadin , Morbid obesity Breathing is fine as along as using O2 as needed Uses oxygen at 2 L for sleep and when needed. She may need to be requalified. Occasional minor wheeze in the mornings does not bother her enough to use her rescue inhaler.  11/02/12- 44 yoF never smoker, hx of OSA on CPAP Hx Sarcoid,, PAF-chronic coumadin , Morbid obesity FOLLOWS FOR: SOB (mostly with activity) and wheezing; Still using O2 as needed Has had flu vaccine. She is pleased to report no issues at all with no recent colds or respiratory events. She is trying to lose some weight. Using oxygen 2L/ Advanced for sleep and if needed.  06/06/13-71 yoF never smoker, hx of OSA on CPAP Hx Sarcoid,, PAFib-chronic coumadin , Morbid obesity Review of Systems-see HPI FOLLOWS FOR: Post hospital; Continues to have SOB-worse with heat Hospitalized with aspiration pneumonia and then with acute GI  bleed. Now denies abdominal pain, nausea obvious blood loss. She was left on theophylline. Still feels "washed out and tired" CT chest 05/11/13- IMPRESSION:  1. No evidence pulmonary embolism.  2. Right greater than left lower lobe predominant airspace  disease, most consistent with infection.  3. Scattered pulmonary nodules. Nonspecific, especially given the  clinical history of sarcoidosis and breast cancer. Consider short-  term CT follow-up at 3 months to exclude metastasis.  4. Thoracic adenopathy, which could be reactive or due to  sarcoidosis. Metastatic disease cannot be excluded. This  could  also be reevaluated at follow-up.  5. Small hiatal hernia.  6. 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  Original Report Authenticated By: Abigail Miyamoto, M.D  08/08/13- 65 yoF never smoker, hx of OSA on CPAP Hx Sarcoid,, PAF-chronic coumadin , Morbid obesity FOLLOWS FOR: stay indoors as much as possible due to heat; denies any wheezing, SOB, cough, or congestion. Nausea and vomiting 2 days ago and again this morning, starting after she was up and around. No blood or pain. We think aspiration caused the pneumonia for which she was hospitalized earlier this year. Oxygen 2 L/Advanced CPAP 9  10/31/13- 72 yoF never smoker, hx of OSA on CPAP Hx Sarcoid,, PAF-chronic coumadin , Morbid obesity Pt suspects pna.  Pt c/o nonprod cough, SOB, wheezing. She had right greater than left effusions on CT scan in October, addressed with diuretic. Describes cough off-and-on for 5 or 6 weeks, nonproductive. Nebulizer helps some. No fever or chest pain. Has had several rounds of antibiotics at [facility]. Continues CPAP 9 with oxygen 2 L/Advanced CT chest 09/26/13 IMPRESSION:  1. Stable small bilateral pulmonary nodules. Accordingly these were  not acute inflammatory nodules on the prior exam from May 2014.  Scattered pulmonary nodules were noted on the prior CT chest from  2002 which is not available for direct comparison. Accordingly,  these likely represent benign chronic nodules, possibly associated  with old granulomatous disease or sarcoidosis. It may be prudent to  continue to monitor with chest CT in 6-12 months.  2. New moderate right and small left pleural effusions with passive  atelectasis.  3. Lower lobe airway thickening bilaterally, causing luminal  narrowing and contributing to the underlying atelectasis in the lung  bases.  4. Skin thickening in the right breast with inverted nipple. Skin  thickening was also noted on the breast ultrasound 1 month  ago.  Differential diagnosis: Continued mastitis versus inflammatory  breast cancer. Correlate with any clinical improvement/clinical  signs. Biopsy may be warranted.  5. Mediastinal adenopathy is mostly stable, although an AP window  lymph node is slightly more prominent. This could be due to passive  congestion, sarcoidosis, or malignancy.  Electronically Signed  By: Sherryl Barters M.D.  On: 09/20/2013 14:33  01/31/14-  72 yoF never smoker, hx of OSA on CPAP Hx Sarcoid,, PAF-chronic coumadin , Morbid obesity FOLLOWS FOR:  Breathing doing well--No concerns today.  Wearing CPAP 9/ O2 2L/  6-8 hours per night Advanced. Hospitalized 2/13-19 - acute on chronic diastolic congestive heart failure. Still weak and tired. CXR 10/31/13 IMPRESSION:  1. Right pleural effusion.  2. Bibasilar atelectasis versus infiltrates.  Similar findings noted on recent chest CT of 09/20/2013. These  findings are new from prior chest x-ray of 08/18/2013 .  Electronically Signed  By: Marcello Moores Register  On: 10/31/2013 15:42  04/11/14- 56 yoF never smoker, hx of OSA on CPAP Hx Sarcoid,, PAF-chronic coumadin , Morbid obesity  FOLLOWS FOR:  Still having some cough (non-productive) and sob increased since last OV.  Wearing CPAP 9/ O2 2L Advanced  6 hours per night            Admits minor cough but feels she is doing okay. Admits a little daytime sleepiness but also says that is okay with her. She continues her sedentary lifestyle. No medication concerns or acute issues. CXR 03/25/14 IMPRESSION:  Clearing of right-sided airspace disease and right pleural effusion.  Mild cardiomegaly with nonspecific interstitial thickening.  Electronically Signed  By: Abigail Miyamoto M.D.  On: 03/25/2014 13:51  08/12/14-  39 yoF never smoker, hx of OSA on CPAP Hx Sarcoid, dCH, lung nodules,  complicated by  AF-chronic Eliquis, PHTN , Morbid obesity,  DM, hx breast CA, GERD/ hx asp.pneumonia CPAP 9/ Advanced all night, every night, O2 2L CXR  04/17/14 IMPRESSION:  No active cardiopulmonary disease.  There is a nodular opacity projected over the right lower lung  measuring 15 mm not delineated on the lateral view. This may reflect  a small pulmonary nodule versus artifact which is superficial to the  patient. Follow up radiography is recommended.  Electronically Signed  By: Kathreen Devoid  On: 04/17/2014 20:42  03/19/15-  73 yoF never smoker, hx of OSA on CPAP Hx Sarcoid, dCH, lung nodules,  complicated by  AF-chronic Eliquis, PHTN , Morbid obesity,  DM, hx breast CA, GERD/ hx asp.pneumonia FOLLOWS FOR: increased cough x 4 days-non productive;wheezing, SOB. Wears O2 throughout the day. Continues CPAP 9 with oxygen 2 L/Advanced Had a hard coughing spell in our waiting room today, relieved by taking out her false teeth.  CT chest 09/29/14 IMPRESSION: 1. All of the previously noted tiny pulmonary nodules appear either similar or slightly smaller than prior examinations dating back to 05/11/2013, compatible with benign nodules. No further imaging followup is recommended. 2. Multiple borderline enlarged and minimally enlarged mediastinal lymph nodes, generally smaller than prior examinations, with the largest lymph node measuring 15 mm in the lower right paratracheal station. This has a benign appear fatty hilum. These findings are favored to be benign. 3. Cardiomegaly with biatrial dilatation. 4. Diffuse bronchial wall thickening, similar to prior studies, which may suggest chronic bronchitis. 5. Moderate sized hiatal hernia. Electronically Signed  By: Vinnie Langton M.D.  On: 09/29/2014 16:57  ROS-see HPI Constitutional:  No--weight loss, no-night sweats, fevers, chills, +fatigue, lassitude. HEENT:   No-  headaches, difficulty swallowing, tooth/dental problems, sore throat,       No-  sneezing, itching, ear ache, nasal congestion, post nasal drip,  CV:  No-   chest pain, orthopnea, PND, swelling in lower extremities,  anasarca,  dizziness, palpitations Resp: + shortness of breath with exertion, not at rest.              No-   productive cough, +non-productive cough,  No-  coughing up of blood.              No-   change in color of mucus.  + wheezing.   Skin: No-   rash or lesions. GI:  No-   heartburn, indigestion, abdominal pain, nausea, vomiting, GU:  MS:  No-   joint pain or swelling.   Neuro-  nothing unusual Psych:  No- change in mood or affect. No depression or anxiety.  No memory loss.  Objective:   Physical Exam General- Alert, Oriented, Affect-appropriate, Distress- none acute, +morbidly obese, +wheelchair, portable oxygen 2L/ 95% Skin- looks pale Lymphadenopathy- none Head- atraumatic  Eyes- Gross vision intact, PERRLA, conjunctivae clear secretions            Ears- Hearing, canals-normal            Nose- Clear, no-Septal dev, mucus, polyps, erosion, perforation             Throat- Mallampati II , mucosa clear , drainage- none, tonsils- atrophic Neck- flexible , trachea midline, no stridor , thyroid nl, carotid no bruit Chest - symmetrical excursion , unlabored           Heart/CV- nearly regular with occasional skipped , no murmur , no gallop  , no rub, nl s1s2              - JVD- none , 1+ edema bilaterally/ elastic hose, stasis changes- none, varices-none  Lung-  wheeze +,  + cough , dullness-none, rub- none           Chest wall-  Abd-  Br/ Gen/ Rectal- Not done, not indicated Extrem-  Neuro- grossly intact to observation

## 2015-03-19 NOTE — Patient Instructions (Addendum)
Breo Ellipta inhaler was listed on your med list from [place], but you say you are using Advair 250. You don't need both, so I am taking the Breo inhaler off your list. You will continue Advair 250 1 puff then rinse mouth, twice daily.  You had a hard cough today, that seemed to get better when you took your dentures out. Please pay attention to whether the dentures make you cough.  Order- CXR  Dx asthma with bronchitis exacerbation  Neb xop 0.63  Depo 80

## 2015-03-19 NOTE — Assessment & Plan Note (Signed)
I suspect a reflux event in our waiting room today but she is clinically improved now and chest x-ray does not suggest an active pneumonitis, so far.

## 2015-03-19 NOTE — Assessment & Plan Note (Signed)
Imaging continues to indicate hypoventilation due to abdominal obesity. Weight loss is encouraged but she has never achieved it.

## 2015-03-19 NOTE — Assessment & Plan Note (Signed)
Nodule stable/smaller on CT chest of 09/29/2014. That suggests inflammatory etiology.

## 2015-03-23 ENCOUNTER — Telehealth: Payer: Self-pay | Admitting: Internal Medicine

## 2015-03-23 NOTE — Telephone Encounter (Signed)
I called spoke with pt son. Nothing further needed.

## 2015-03-23 NOTE — Telephone Encounter (Signed)
Thanks for the update. Please let the son know that the patient should continue to get better; if not please have her nursing facility to contact our office or he can for a sooner appt with CY. Thanks.

## 2015-03-23 NOTE — Telephone Encounter (Signed)
Patient's son says that his mom has improved, but not 100% over the weekend.  Coughing a good bit, dry cough.  Chest is sounding a little wheezy.  Overall, patient is okay.  The cough intensity has decreased.    To Katie for follow up

## 2015-03-24 ENCOUNTER — Telehealth: Payer: Self-pay | Admitting: Internal Medicine

## 2015-03-24 MED ORDER — AMOXICILLIN 500 MG PO CAPS: 500.0000 mg | ORAL_CAPSULE | Freq: Three times a day (TID) | ORAL | Status: DC

## 2015-03-24 NOTE — Telephone Encounter (Signed)
Spoke with pt's son and he states that per Benjamine Mola at the facility we can fax rx to her and she will forward it to [facility].  Rx printed and signed by Dr Annamaria Boots and faxed to Lorain Childes at facility (Fax# [phone])

## 2015-03-24 NOTE — Telephone Encounter (Signed)
Spoke with pt's son. States that when he spoke with pt last night she was very hoarse. Pt told him that she has been coughing a lot as well. Son feels like pt has regressed since seeing CY last week. Would like CY's recommendations.  Allergies  Allergen Reactions  . Aspirin     Avoids due to being on blood thinners  . Contrast Media [Iodinated Diagnostic Agents] Nausea And Vomiting  . Ibuprofen     Avoids due to being on blood thinners  . Pravachol Other (See Comments)    Muscle pain  . Pravastatin Sodium     CY - please advise. Thanks.

## 2015-03-24 NOTE — Telephone Encounter (Signed)
Please fax rx to [facility] care [phone], however facility [phone] states to fax it to them first.  Please call timothy [phone]

## 2015-03-24 NOTE — Telephone Encounter (Signed)
Spoke with pt's son. States that pt is now living in an assisted living facility. He is going to call them and figure out where we can send pt's prescription.

## 2015-03-24 NOTE — Telephone Encounter (Signed)
Offer amoxacillin 500 mg, # 21, 1 three times daily

## 2015-03-24 NOTE — Telephone Encounter (Signed)
lmtcb X1 for [name] (son) to verify that the rx needs to be faxed to the facility and not the pharmacy.

## 2015-04-01 ENCOUNTER — Other Ambulatory Visit: Payer: Self-pay

## 2015-04-01 DIAGNOSIS — C50311 Malignant neoplasm of lower-inner quadrant of right female breast: Secondary | ICD-10-CM

## 2015-04-01 NOTE — Assessment & Plan Note (Signed)
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 She was then begun on Aromasin 25 mg daily since June 2012.   Surveillance:  1. Breast Exam 04/02/15: Normal 2. Mammogram :   Osteoporosis: Currently on Fosamax and calcium and vitamin D. I recommended that she get a bone density test.  Iron Deficiency Anemia:  RTC in 1 year with labs

## 2015-04-02 ENCOUNTER — Ambulatory Visit: Payer: Medicare Other | Admitting: Hematology and Oncology

## 2015-04-02 ENCOUNTER — Other Ambulatory Visit: Payer: Medicare Other

## 2015-04-16 ENCOUNTER — Telehealth: Payer: Self-pay

## 2015-04-16 NOTE — Telephone Encounter (Signed)
Bone density results dtd 4/21/106 rcvd from [place].  Reviewed by Dr. Lindi Adie.  Sent to scan.

## 2015-05-12 ENCOUNTER — Telehealth: Payer: Self-pay

## 2015-05-12 NOTE — Telephone Encounter (Signed)
Mammogram results dtd 04/02/15 rcvd from [place].  Reviewed by Dr. Lindi Adie.  Sent to scan.

## 2015-06-02 NOTE — Patient Outreach (Signed)
[facility]) Care Management  06/02/2015  [name] 04/07/1941 [id number]   Referral from [facility], assigned Mariann Laster, RN to outreach patient.  [name]. [place], [place] Management [place] Assistant Phone: [phone] Fax: [phone]

## 2015-06-11 ENCOUNTER — Telehealth: Payer: Self-pay

## 2015-06-11 ENCOUNTER — Other Ambulatory Visit: Payer: Self-pay

## 2015-06-11 NOTE — Patient Outreach (Signed)
[name]) Care Management  06/11/2015  [name] 10-04-1941 [id number]   Telephonic Care Management Note:  (Triage Screening)  Referral Date:  06/02/15  Referral Source: Dr. Kenton Kingfisher Referral Issue:  CHF, COPD, DM, HTN, 2 admissions and 1 SNF admission  Triage Screening Date: PCP:  Dr. Shirline Frees  Outreach phone contact to [phone].  Contact states she is the patient's daughter in law and patient does not live there.  States patient lives in a [place].  States her husband (patient's son is POA) but he is working and not available for phone call.  Contact states she is not able to recall the patient's contact number for patient but that patient is able to talk on the phone.  RN CM requested call back from [employer] to obtain phone contact information for patient.   RN reviewed Epic MR and per history resides at [facility], [place], [place], [place]  [phone] (son's number) RN CM will reschedule for next phone attempt within 1-2 weeks.   Mariann Laster, RN, BSN, [facility], CCM  [employer] Management Coordinator [phone] Office [phone] Direct [phone] Cell

## 2015-06-11 NOTE — Patient Outreach (Signed)
[facility]) Care Management  06/11/2015  [name] February 11, 1941 [id number]   Inbound call from [place], Son, [name].  RN CM provided explanation for reason for call and discussed [facility] services.  States he will see his mother within the week and will discuss her interest in a contact call from [facility].  Son confirms patient is able to complete calls and interact with caller on her own.  Son states many of patient's needs are being met at the ALF but will call RN CM back within the next 1-2 weeks to provide contact # if patient gives consent and agrees to call.   RN CM will attempt follow-up with patient following update from son.  Mariann Laster, RN, BSN, [facility], CCM  [employer] Management Coordinator [phone] Office [phone] Direct [phone] Cell

## 2015-06-23 ENCOUNTER — Other Ambulatory Visit: Payer: Medicare Other

## 2015-06-23 NOTE — Patient Outreach (Signed)
[facility]) Care Management  06/23/2015  [name] [birth year] [id number]  Telephonic Care Management Note: (Triage Screening)  Referral Date: 06/02/15  Referral Source:Dr. Kenton Kingfisher Referral Issue: CHF, COPD, DM, HTN, 2 admissions and 1 SNF admission  Triage Screening Date: PCP: Dr. Shirline Frees  Outreach call attempt #2 to [phone]   H/O previous contact with  patient's son and daughter in law [phone].   H/O patient lives in a [place].  Patient's son is POA.  H/O patient is able to talk on the phone.  H/O son planned to contact patient to confirm she would agree to contact call and if patient agreed; son would call RN CM back with patients phone #.  H/O per  Epic MR patient resides at [facility], [place], [place], [place] RN CM will reschedule for next phone attempt within 1-2 weeks.   Mariann Laster, RN, BSN, [facility], CCM  [employer] Management Coordinator [phone] Office [phone] Direct [phone] Cell

## 2015-06-30 ENCOUNTER — Other Ambulatory Visit: Payer: Self-pay

## 2015-06-30 NOTE — Patient Outreach (Signed)
[name]) Care Management  06/30/2015  [name] 1941-07-19 [id number]   Telephonic Care Management Note: (Triage Screening)  Referral Date: 06/02/15  Referral Source:Dr. Kenton Kingfisher Referral Issue: CHF, COPD, DM, HTN, 2 admissions and 1 SNF admission  PCP: Dr. Shirline Frees  Outreach call attempt #3 to [phone]. Patient not reached; no answer following several rings.   H/O previous contact with patient's son and daughter in law [phone]. H/O patient lives in a [place]. Patient's son is POA. H/O patient is able to talk on the phone. H/O son planned to contact patient to confirm she would agree to contact call and if patient agreed; son would call RN CM back with patients phone #. H/O per Epic MR patient resides at [facility], [place], [place], [place]  Plan: RN CM will send unsuccessful outreach letter to patient; will close case if no response received from patient in 10 business days.   Mariann Laster, RN, BSN, [facility], CCM  [employer] Management Coordinator [phone] Office [phone] Direct [phone] Cell

## 2015-07-14 NOTE — Patient Outreach (Signed)
[facility]) Care Management  07/14/2015  [name] 1941/02/25 [id number]   Case reviewed; no response received from patient following 3 attempted calls and unsuccessful outreach letter.   Case closed to [facility] RN CM services.   Plan: RN CM notified [place] Assistant to close case:  Unable to contact RN CM will send Dr. Kenton Kingfisher case closure notification letter.   Mariann Laster, RN, BSN, [facility], CCM  [employer] Management Coordinator [phone] Office [phone] Direct [phone] Cell

## 2015-07-14 NOTE — Patient Outreach (Signed)
[facility]) Care Management  07/14/2015  [name] Mar 06, 1941 [id number]  Case closure letter sent to MD, Dr. Girtha Hake, RN, BSN, [facility], [place] Management Care Management Coordinator [phone] Office [phone] Direct [phone] Cell

## 2015-07-20 NOTE — Patient Outreach (Signed)
[facility]) Care Management  07/20/2015  [name] 11-02-1941 [id number]   Notification from Mariann Laster, RN to close case due to unable to contact patient for [place] Management services.  Thanks, [name]. [place], [place] Assistant Phone: [phone] Fax: [phone]

## 2015-08-31 IMAGING — CT CT CHEST W/O CM
2 of 3 series · 15 of 36 positions shown, 18 images · IV contrast (Omnipaque 300)
Comparison: Multiple exams, including 05/11/2013

CLINICAL DATA: Pneumonia. COPD. Shortness of breath. Lung nodules.
Sarcoidosis.

EXAM:
CT CHEST WITHOUT CONTRAST
TECHNIQUE: Multidetector CT imaging of the chest was performed following the
standard protocol without IV contrast.

[Series 2: chest routine with · axial · 0.78mm/px · z∈[-262,-32]mm · 12 of 56 slices shown, 15 images]
[im 5/56  mediastinal]
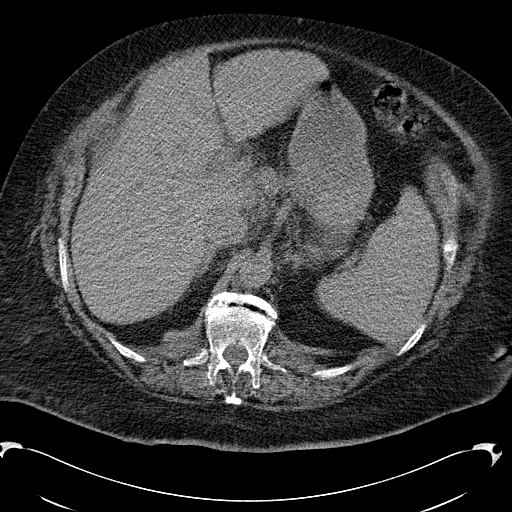
[im 5/56  lung]
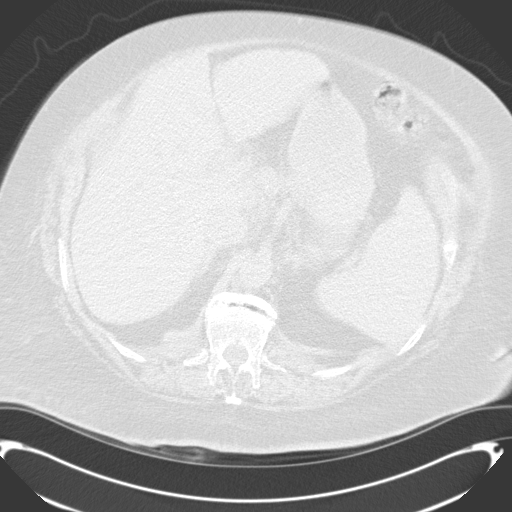
[im 9/56  lung]
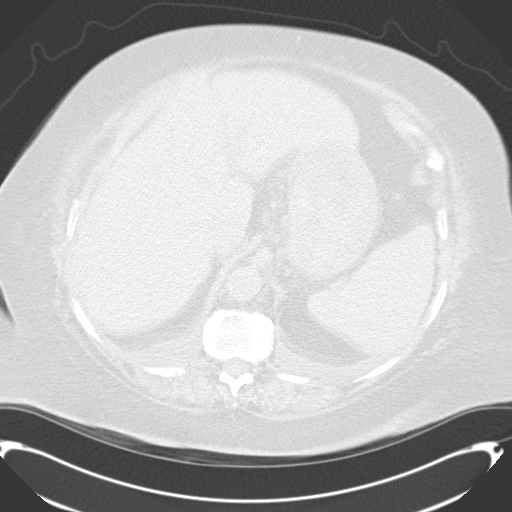
[im 13/56  lung]
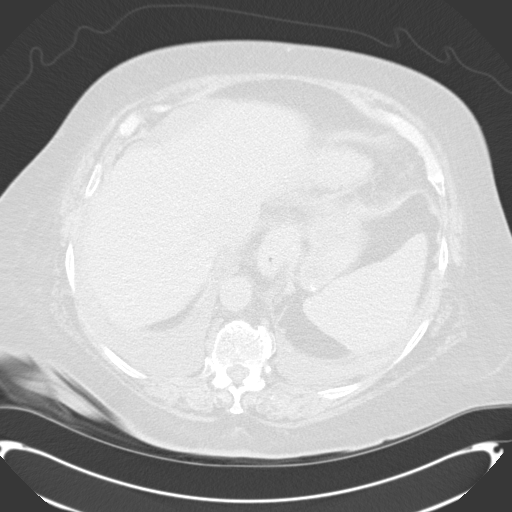
[im 17/56  lung]
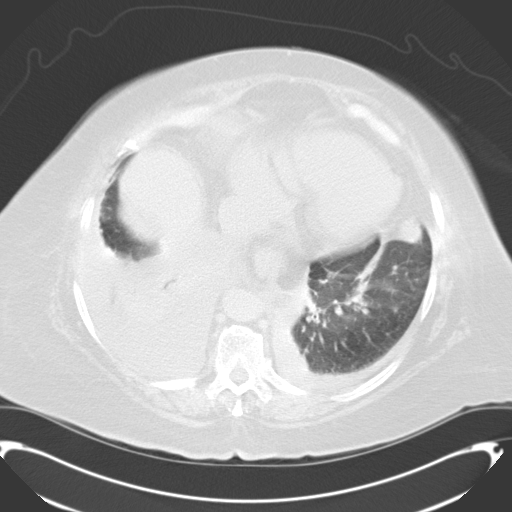
[im 21/56  mediastinal]
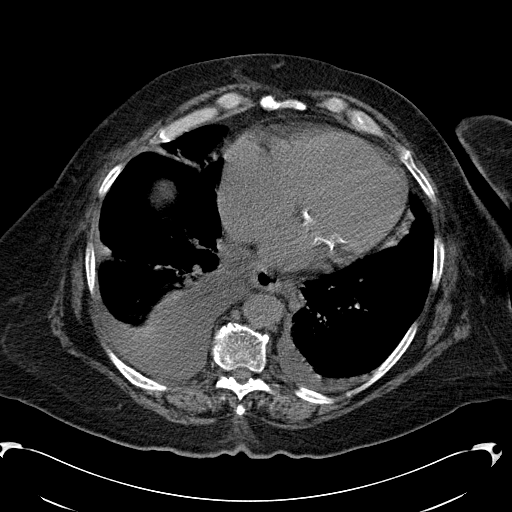
[im 21/56  lung]
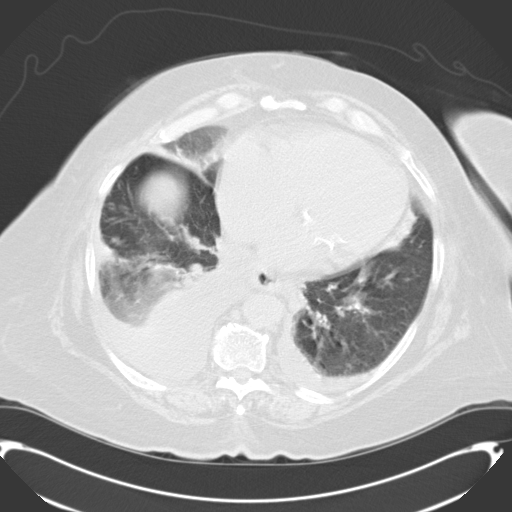
[im 25/56  lung]
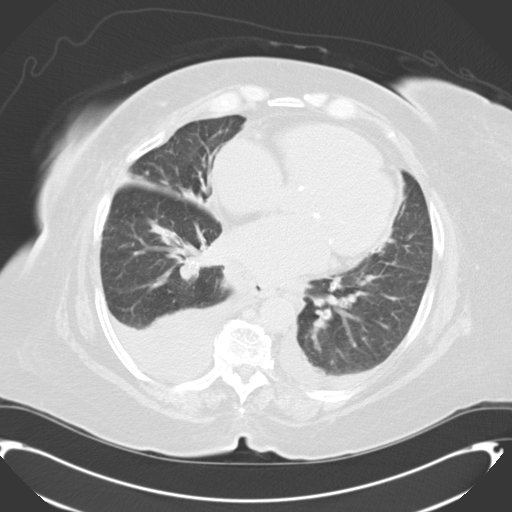
[im 31/56  lung]
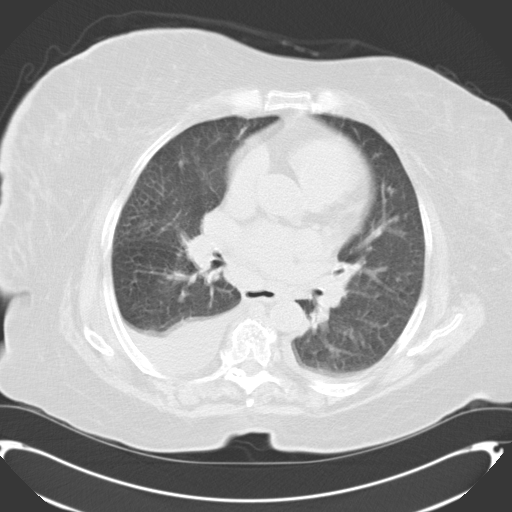
[im 35/56  lung]
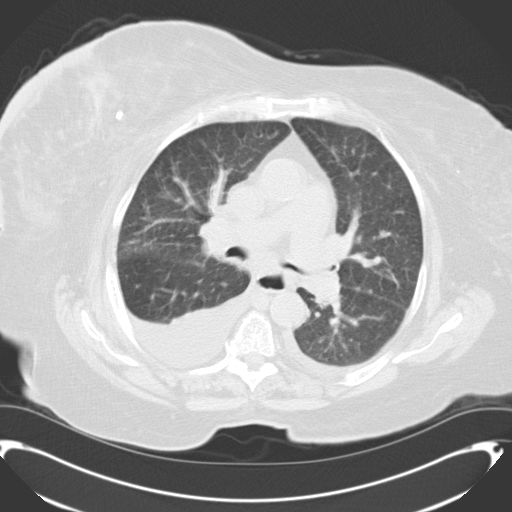
[im 39/56  mediastinal]
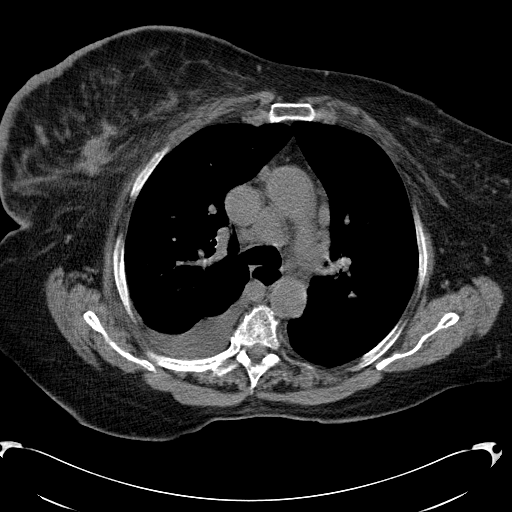
[im 39/56  lung]
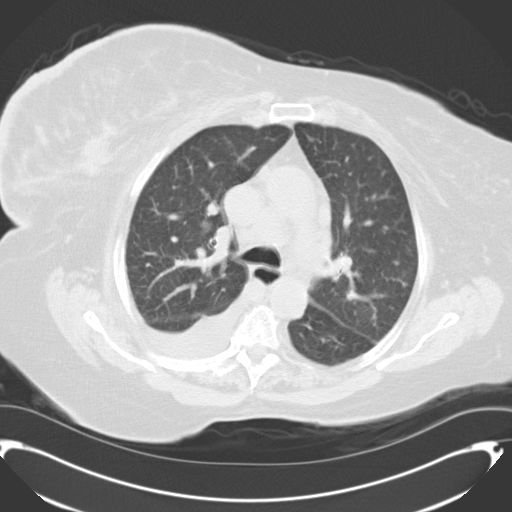
[im 43/56  lung]
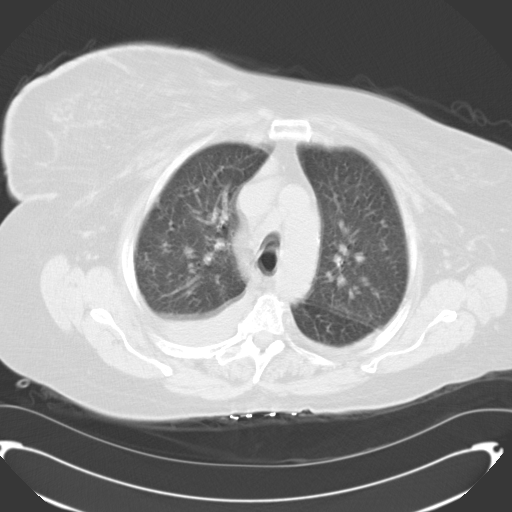
[im 47/56  lung]
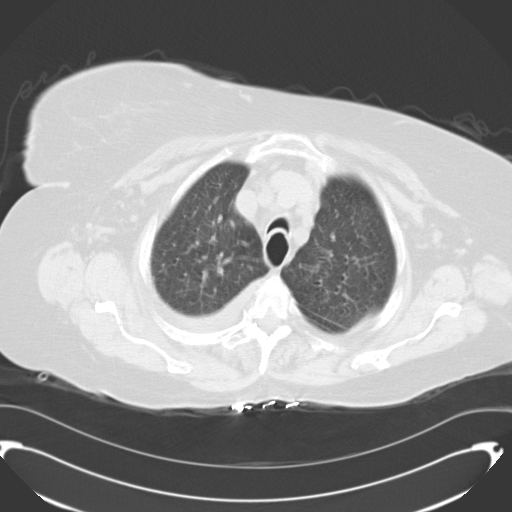
[im 51/56  lung]
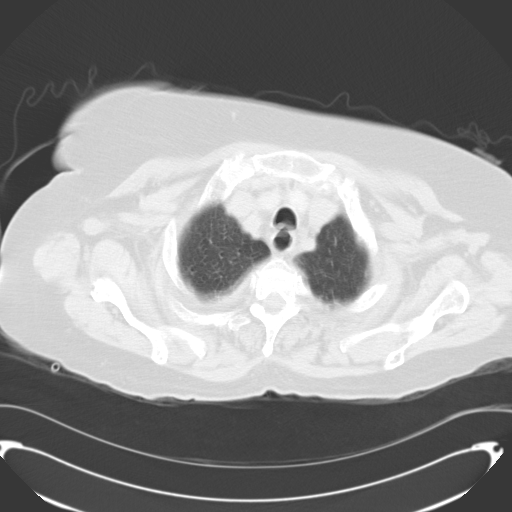

[Series 602: cor · coronal · 0.78mm/px · 3 of 125 slices shown]
[im 25/125  lung]
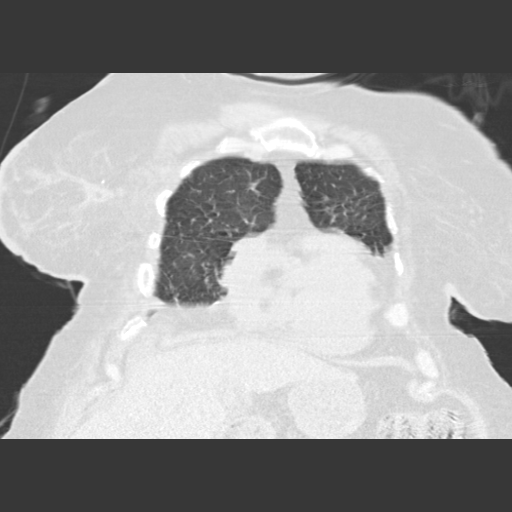
[im 50/125  lung]
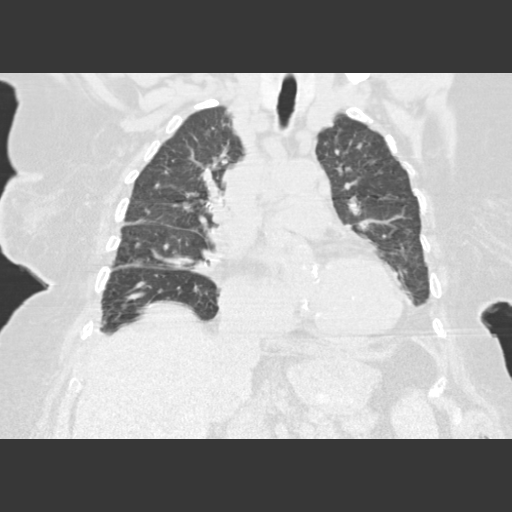
[im 75/125  lung]
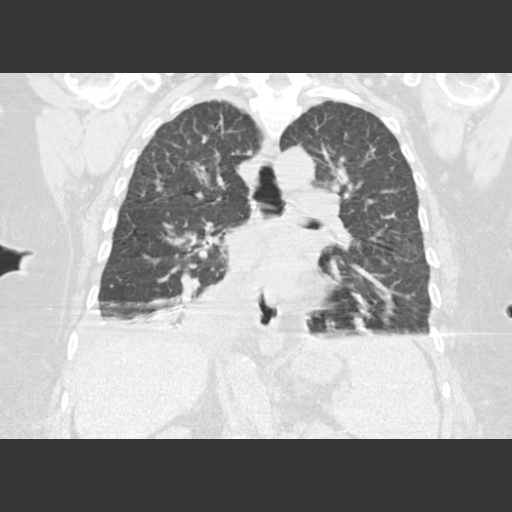

[15 of 36 positions shown; findings below may reference images not displayed]

FINDINGS: Right paratracheal node short axis 1.2 cm, image 13 of series 2,
stable. Lower right paratracheal node short axis diameter 2.0 cm,
stable. AP window lymph nodes slightly enlarged, maximum diameter
1.1 cm (formerly 0.7 cm).

Aortic and mitral valve calcification noted. New moderate right and
small left pleural effusions with passive atelectasis. Right hilar
node difficult to measure due to lack of IV contrast. Possible
enlarged gastrohepatic ligament node up to 1.7 cm.

Despite efforts by the technologist and patient, motion artifact is
present on today's exam and could not be eliminated. This reduces
exam sensitivity and specificity.

Airway thickening noted in the lower lobes bilaterally with
resulting luminal narrowing. There is mild right middle lobe
atelectasis posteriorly.

The scattered small pulmonary nodules measuring in the 45 mm range
are stable compared to prior.

Thoracic kyphosis noted. There is a hemangioma in the T8 vertebral
body.

Dense calcifications noted in the right breast. Skin thickening and
inverted nipple in the right breast.
IMPRESSION: 1. Stable small bilateral pulmonary nodules. Accordingly these were
not acute inflammatory nodules on the prior exam from April 2013.
Scattered pulmonary nodules were noted on the prior CT chest from
[id number] which is not available for direct comparison. Accordingly,
these likely represent benign chronic nodules, possibly associated
with old granulomatous disease or sarcoidosis. It may be prudent to
continue to monitor with chest CT in 6-12 months.
2. New moderate right and small left pleural effusions with passive
atelectasis.
3. Lower lobe airway thickening bilaterally, causing luminal
narrowing and contributing to the underlying atelectasis in the lung
bases.
4. Skin thickening in the right breast with inverted nipple. Skin
thickening was also noted on the breast ultrasound 1 month ago.
Differential diagnosis: Continued mastitis versus inflammatory
breast cancer. Correlate with any clinical improvement/clinical
signs. Biopsy may be warranted.
5. Mediastinal adenopathy is mostly stable, although an AP window
lymph node is slightly more prominent. This could be due to passive
congestion, sarcoidosis, or malignancy.

## 2015-09-15 ENCOUNTER — Encounter: Payer: Self-pay | Admitting: Internal Medicine

## 2015-09-15 ENCOUNTER — Ambulatory Visit (INDEPENDENT_AMBULATORY_CARE_PROVIDER_SITE_OTHER): Payer: Medicare Other | Admitting: Internal Medicine

## 2015-09-15 VITALS — BP 114/68 | HR 86 | Ht 59.0 in | Wt 264.2 lb

## 2015-09-15 DIAGNOSIS — I481 Persistent atrial fibrillation: Secondary | ICD-10-CM | POA: Diagnosis not present

## 2015-09-15 DIAGNOSIS — I272 Other secondary pulmonary hypertension: Secondary | ICD-10-CM

## 2015-09-15 DIAGNOSIS — J9611 Chronic respiratory failure with hypoxia: Secondary | ICD-10-CM | POA: Diagnosis not present

## 2015-09-15 DIAGNOSIS — D869 Sarcoidosis, unspecified: Secondary | ICD-10-CM

## 2015-09-15 DIAGNOSIS — E662 Morbid (severe) obesity with alveolar hypoventilation: Secondary | ICD-10-CM

## 2015-09-15 DIAGNOSIS — I4819 Other persistent atrial fibrillation: Secondary | ICD-10-CM

## 2015-09-15 DIAGNOSIS — Z23 Encounter for immunization: Secondary | ICD-10-CM

## 2015-09-15 NOTE — Assessment & Plan Note (Signed)
Continues oxygen. 

## 2015-09-15 NOTE — Assessment & Plan Note (Signed)
Chronic oxygen dependence primarily due to obesity hypoventilation syndrome

## 2015-09-15 NOTE — Assessment & Plan Note (Signed)
She has not been able to sustain meaningful weight loss

## 2015-09-15 NOTE — Assessment & Plan Note (Signed)
She remains dependent on oxygen at 2 L continuously. The primary problem is obesity hypoventilation syndrome

## 2015-09-15 NOTE — Assessment & Plan Note (Signed)
Pulse feels like atrial fibrillation with controlled ventricular response rate

## 2015-09-15 NOTE — Progress Notes (Signed)
Patient ID: [name], female    DOB: [birth year], 74 y.o.   MRN: [id number]  HPI  06/13/11- 79 yoF never smoker, hx of OSA on CPAP Hx Sarcoid,, PAF-chronic coumadin , Morbid obesity   Last here December 21, 2010 - Note reviewed Blames heat for lack energy. She also had right breast lumpectomy and XRT in February. Now pending right knee replacement next week.  Got good report at cardiology last week, following for her AFIb. She had stopped ritalin as ineffective for complaints of tiredness before.  Continues CPAPat 9 cwp.   08/05/2011 Follow up  Pt presents today for follow up of O2. PT recently had R. TKR on 06/20/11 w/ rehab stay. She did have some desaturations post op and was started on o2 at 2l /m . Per pt she was discharged to rehab on o2. She had swelling in right lower leg and had venous doppler that was neg for DVT. She is on chronic coumadin for hx of a fib. She says she does wear out easily and has DOE. Does fine at rest w/ no dyspnea.  Wants to see if she can get off O2. Today in office O2 sat at rest was 94%. Walking O2 sat is 92% w/out desaturations.  Denies chest pain or hemoptysis. Weight is down 6 lbs since last ov.  No increased cough or congestion  Wearing CPAP each night  She was discharge home on 08/02/11 . No records from rehab available at todays visit. Will attempt to obtain.   09/22/11-69 yoF never smoker, hx of OSA on CPAP Hx Sarcoid,, PAF-chronic coumadin , Morbid obesity She is staying on her oxygen most of the time. Consider comfortably on room air for a while at rest is is it for sleep and exertion. She is having difficulty getting around with a walker and managing her oxygen tank. Her home care company says it does not have a small portable type that would work for her. She continues her CPAP every night/Advanced. Says she has lost 22 pounds since her knee replacement surgery.  1//3/12- - 69 yoF never smoker, hx of OSA on CPAP Hx Sarcoid,, PAF-chronic coumadin ,  Morbid obesity Still having SOB, slight wheezing at times. cough-productive-clear in color; denies any fever and chills She got portable oxygen/Advanced. Comfortable with her CPAP and using it every night 3 at Has felt sick since Thanksgiving. Now she just doesn't feel she can clear her airways of mucus. She took 2 rounds of antibiotics. Using her nebulizer machine.  05/04/12- 20 yoF never smoker, hx of OSA on CPAP Hx Sarcoid,, PAF-chronic coumadin , Morbid obesity Breathing is fine as along as using O2 as needed Uses oxygen at 2 L for sleep and when needed. She may need to be requalified. Occasional minor wheeze in the mornings does not bother her enough to use her rescue inhaler.  11/02/12- 44 yoF never smoker, hx of OSA on CPAP Hx Sarcoid,, PAF-chronic coumadin , Morbid obesity FOLLOWS FOR: SOB (mostly with activity) and wheezing; Still using O2 as needed Has had flu vaccine. She is pleased to report no issues at all with no recent colds or respiratory events. She is trying to lose some weight. Using oxygen 2L/ Advanced for sleep and if needed.  06/06/13-71 yoF never smoker, hx of OSA on CPAP Hx Sarcoid,, PAFib-chronic coumadin , Morbid obesity Review of Systems-see HPI FOLLOWS FOR: Post hospital; Continues to have SOB-worse with heat Hospitalized with aspiration pneumonia and then with acute GI  bleed. Now denies abdominal pain, nausea obvious blood loss. She was left on theophylline. Still feels "washed out and tired" CT chest 05/11/13- IMPRESSION:  1. No evidence pulmonary embolism.  2. Right greater than left lower lobe predominant airspace  disease, most consistent with infection.  3. Scattered pulmonary nodules. Nonspecific, especially given the  clinical history of sarcoidosis and breast cancer. Consider short-  term CT follow-up at 3 months to exclude metastasis.  4. Thoracic adenopathy, which could be reactive or due to  sarcoidosis. Metastatic disease cannot be excluded. This  could  also be reevaluated at follow-up.  5. Small hiatal hernia.  6. 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  Original Report Authenticated By: Abigail Miyamoto, M.D  08/08/13- 65 yoF never smoker, hx of OSA on CPAP Hx Sarcoid,, PAF-chronic coumadin , Morbid obesity FOLLOWS FOR: stay indoors as much as possible due to heat; denies any wheezing, SOB, cough, or congestion. Nausea and vomiting 2 days ago and again this morning, starting after she was up and around. No blood or pain. We think aspiration caused the pneumonia for which she was hospitalized earlier this year. Oxygen 2 L/Advanced CPAP 9  10/31/13- 72 yoF never smoker, hx of OSA on CPAP Hx Sarcoid,, PAF-chronic coumadin , Morbid obesity Pt suspects pna.  Pt c/o nonprod cough, SOB, wheezing. She had right greater than left effusions on CT scan in October, addressed with diuretic. Describes cough off-and-on for 5 or 6 weeks, nonproductive. Nebulizer helps some. No fever or chest pain. Has had several rounds of antibiotics at [facility]. Continues CPAP 9 with oxygen 2 L/Advanced CT chest 09/26/13 IMPRESSION:  1. Stable small bilateral pulmonary nodules. Accordingly these were  not acute inflammatory nodules on the prior exam from May 2014.  Scattered pulmonary nodules were noted on the prior CT chest from  2002 which is not available for direct comparison. Accordingly,  these likely represent benign chronic nodules, possibly associated  with old granulomatous disease or sarcoidosis. It may be prudent to  continue to monitor with chest CT in 6-12 months.  2. New moderate right and small left pleural effusions with passive  atelectasis.  3. Lower lobe airway thickening bilaterally, causing luminal  narrowing and contributing to the underlying atelectasis in the lung  bases.  4. Skin thickening in the right breast with inverted nipple. Skin  thickening was also noted on the breast ultrasound 1 month  ago.  Differential diagnosis: Continued mastitis versus inflammatory  breast cancer. Correlate with any clinical improvement/clinical  signs. Biopsy may be warranted.  5. Mediastinal adenopathy is mostly stable, although an AP window  lymph node is slightly more prominent. This could be due to passive  congestion, sarcoidosis, or malignancy.  Electronically Signed  By: Sherryl Barters M.D.  On: 09/20/2013 14:33  01/31/14-  72 yoF never smoker, hx of OSA on CPAP Hx Sarcoid,, PAF-chronic coumadin , Morbid obesity FOLLOWS FOR:  Breathing doing well--No concerns today.  Wearing CPAP 9/ O2 2L/  6-8 hours per night Advanced. Hospitalized 2/13-19 - acute on chronic diastolic congestive heart failure. Still weak and tired. CXR 10/31/13 IMPRESSION:  1. Right pleural effusion.  2. Bibasilar atelectasis versus infiltrates.  Similar findings noted on recent chest CT of 09/20/2013. These  findings are new from prior chest x-ray of 08/18/2013 .  Electronically Signed  By: Marcello Moores Register  On: 10/31/2013 15:42  04/11/14- 56 yoF never smoker, hx of OSA on CPAP Hx Sarcoid,, PAF-chronic coumadin , Morbid obesity  FOLLOWS FOR:  Still having some cough (non-productive) and sob increased since last OV.  Wearing CPAP 9/ O2 2L Advanced  6 hours per night            Admits minor cough but feels she is doing okay. Admits a little daytime sleepiness but also says that is okay with her. She continues her sedentary lifestyle. No medication concerns or acute issues. CXR 03/25/14 IMPRESSION:  Clearing of right-sided airspace disease and right pleural effusion.  Mild cardiomegaly with nonspecific interstitial thickening.  Electronically Signed  By: Abigail Miyamoto M.D.  On: 03/25/2014 13:51  08/12/14-  64 yoF never smoker, hx of OSA on CPAP Hx Sarcoid, dCH, lung nodules,  complicated by  AF-chronic Eliquis, PHTN , Morbid obesity,  DM, hx breast CA, GERD/ hx asp.pneumonia CPAP 9/ Advanced all night, every night, O2 2L CXR  04/17/14 IMPRESSION:  No active cardiopulmonary disease.  There is a nodular opacity projected over the right lower lung  measuring 15 mm not delineated on the lateral view. This may reflect  a small pulmonary nodule versus artifact which is superficial to the  patient. Follow up radiography is recommended.  Electronically Signed  By: Kathreen Devoid  On: 04/17/2014 20:42  03/19/15-  73 yoF never smoker, hx of OSA on CPAP Hx Sarcoid, dCH, lung nodules,  complicated by  AF-chronic Eliquis, PHTN , Morbid obesity,  DM, hx breast CA, GERD/ hx asp.pneumonia FOLLOWS FOR: increased cough x 4 days-non productive;wheezing, SOB. Wears O2 throughout the day. Continues CPAP 9 with oxygen 2 L/Advanced Had a hard coughing spell in our waiting room today, relieved by taking out her false teeth.  CT chest 09/29/14 IMPRESSION: 1. All of the previously noted tiny pulmonary nodules appear either similar or slightly smaller than prior examinations dating back to 05/11/2013, compatible with benign nodules. No further imaging followup is recommended. 2. Multiple borderline enlarged and minimally enlarged mediastinal lymph nodes, generally smaller than prior examinations, with the largest lymph node measuring 15 mm in the lower right paratracheal station. This has a benign appear fatty hilum. These findings are favored to be benign. 3. Cardiomegaly with biatrial dilatation. 4. Diffuse bronchial wall thickening, similar to prior studies, which may suggest chronic bronchitis. 5. Moderate sized hiatal hernia. Electronically Signed  By: Vinnie Langton M.D.  On: 09/29/2014 16:57  09/15/15-  45 yoF never smoker, hx of OSA on CPAP Hx Sarcoid, dCH, lung nodules,  complicated by  AF-chronic Eliquis, PHTN , Morbid obesity,  DM, hx breast CA, GERD/ hx asp.pneumonia CPAP 9/Advanced O2 2 L/Advanced/continuous Now living at [place] . She says pneumonia vaccine is up-to-date and asks flu vaccine. Feels well. No acute  events. Chest x-ray in April had shown hypoventilation as expected for her body habitus. Images reviewed. CXR 03/19/15 IMPRESSION: Increased bibasilar opacities on the frontal examination may reflect atelectasis secondary to low lung volumes. No definite change or acute findings identified on the lateral view. Electronically Signed  By: Richardean Sale M.D.  On: 03/19/2015 17:41  ROS-see HPI Constitutional:  No--weight loss, no-night sweats, fevers, chills, +fatigue, lassitude. HEENT:   No-  headaches, difficulty swallowing, tooth/dental problems, sore throat,       No-  sneezing, itching, ear ache, nasal congestion, post nasal drip,  CV:  No-   chest pain, orthopnea, PND, swelling in lower extremities, anasarca,  dizziness, palpitations Resp: + shortness of breath with exertion, not at rest.              No-  productive cough, +non-productive cough,  No-  coughing up of blood.              No-   change in color of mucus.  + wheezing.   Skin: No-   rash or lesions. GI:  No-   heartburn, indigestion, abdominal pain, nausea, vomiting, GU:  MS:  No-   joint pain or swelling.   Neuro-  nothing unusual Psych:  No- change in mood or affect. No depression or anxiety.  No memory loss.  Objective:   Physical Exam General- Alert, Oriented, Affect-appropriate, Distress- none acute, +morbidly obese, + rolling walker,                 portable oxygen 2L/ 95% Skin- looks pale Lymphadenopathy- none Head- atraumatic            Eyes- Gross vision intact, PERRLA, conjunctivae clear secretions            Ears- Hearing, canals-normal            Nose- Clear, no-Septal dev, mucus, polyps, erosion, perforation             Throat- Mallampati II , mucosa clear , drainage- none, tonsils- atrophic Neck- flexible , trachea midline, no stridor , thyroid nl, carotid no bruit Chest - symmetrical excursion , unlabored           Heart/CV- nearly regular with occasional skipped , no murmur , no gallop  , no  rub, nl s1s2              - JVD- none , edema bilaterally-none, stasis changes- none, varices-none  Lung-  wheeze -none,   cough -none, dullness-none, rub- none           Chest wall-  Abd-  Br/ Gen/ Rectal- Not done, not indicated Extrem-  Neuro- grossly intact to observation

## 2015-09-15 NOTE — Assessment & Plan Note (Signed)
In long-term remission

## 2015-09-15 NOTE — Patient Instructions (Signed)
Flu vax  Continue CPAP 9/ Advanced- all night, every night  Continue O2 2L/Min   continously

## 2015-09-17 ENCOUNTER — Ambulatory Visit: Payer: Medicare Other | Admitting: Internal Medicine

## 2015-09-18 ENCOUNTER — Ambulatory Visit: Payer: Medicare Other | Admitting: Internal Medicine

## 2015-10-05 ENCOUNTER — Encounter (HOSPITAL_COMMUNITY): Payer: Self-pay | Admitting: Emergency Medicine

## 2015-10-05 ENCOUNTER — Inpatient Hospital Stay (HOSPITAL_COMMUNITY)
Admission: EM | Admit: 2015-10-05 | Discharge: 2015-10-09 | DRG: 872 | Disposition: A | Discharge status: Skilled Nursing Facility | Payer: Medicare Other | Attending: Internal Medicine | Admitting: Internal Medicine

## 2015-10-05 ENCOUNTER — Emergency Department (HOSPITAL_COMMUNITY): Payer: Medicare Other

## 2015-10-05 DIAGNOSIS — Z886 Allergy status to analgesic agent status: Secondary | ICD-10-CM

## 2015-10-05 DIAGNOSIS — B372 Candidiasis of skin and nail: Secondary | ICD-10-CM | POA: Diagnosis present

## 2015-10-05 DIAGNOSIS — I071 Rheumatic tricuspid insufficiency: Secondary | ICD-10-CM | POA: Diagnosis present

## 2015-10-05 DIAGNOSIS — Z8542 Personal history of malignant neoplasm of other parts of uterus: Secondary | ICD-10-CM

## 2015-10-05 DIAGNOSIS — R0789 Other chest pain: Secondary | ICD-10-CM | POA: Diagnosis present

## 2015-10-05 DIAGNOSIS — G4733 Obstructive sleep apnea (adult) (pediatric): Secondary | ICD-10-CM | POA: Diagnosis present

## 2015-10-05 DIAGNOSIS — L03314 Cellulitis of groin: Secondary | ICD-10-CM | POA: Diagnosis present

## 2015-10-05 DIAGNOSIS — Z9981 Dependence on supplemental oxygen: Secondary | ICD-10-CM

## 2015-10-05 DIAGNOSIS — J9611 Chronic respiratory failure with hypoxia: Secondary | ICD-10-CM

## 2015-10-05 DIAGNOSIS — C50311 Malignant neoplasm of lower-inner quadrant of right female breast: Secondary | ICD-10-CM | POA: Diagnosis not present

## 2015-10-05 DIAGNOSIS — I5032 Chronic diastolic (congestive) heart failure: Secondary | ICD-10-CM | POA: Diagnosis present

## 2015-10-05 DIAGNOSIS — E662 Morbid (severe) obesity with alveolar hypoventilation: Secondary | ICD-10-CM

## 2015-10-05 DIAGNOSIS — I481 Persistent atrial fibrillation: Secondary | ICD-10-CM | POA: Diagnosis not present

## 2015-10-05 DIAGNOSIS — I482 Chronic atrial fibrillation, unspecified: Secondary | ICD-10-CM | POA: Diagnosis present

## 2015-10-05 DIAGNOSIS — Z90710 Acquired absence of both cervix and uterus: Secondary | ICD-10-CM

## 2015-10-05 DIAGNOSIS — J449 Chronic obstructive pulmonary disease, unspecified: Secondary | ICD-10-CM | POA: Diagnosis present

## 2015-10-05 DIAGNOSIS — D869 Sarcoidosis, unspecified: Secondary | ICD-10-CM

## 2015-10-05 DIAGNOSIS — I272 Other secondary pulmonary hypertension: Secondary | ICD-10-CM | POA: Diagnosis present

## 2015-10-05 DIAGNOSIS — N390 Urinary tract infection, site not specified: Secondary | ICD-10-CM | POA: Diagnosis present

## 2015-10-05 DIAGNOSIS — R079 Chest pain, unspecified: Secondary | ICD-10-CM

## 2015-10-05 DIAGNOSIS — A419 Sepsis, unspecified organism: Principal | ICD-10-CM

## 2015-10-05 DIAGNOSIS — Z794 Long term (current) use of insulin: Secondary | ICD-10-CM

## 2015-10-05 DIAGNOSIS — Z7901 Long term (current) use of anticoagulants: Secondary | ICD-10-CM

## 2015-10-05 DIAGNOSIS — Z79899 Other long term (current) drug therapy: Secondary | ICD-10-CM

## 2015-10-05 DIAGNOSIS — L8990 Pressure ulcer of unspecified site, unspecified stage: Secondary | ICD-10-CM | POA: Insufficient documentation

## 2015-10-05 DIAGNOSIS — F0390 Unspecified dementia without behavioral disturbance: Secondary | ICD-10-CM

## 2015-10-05 DIAGNOSIS — Z91041 Radiographic dye allergy status: Secondary | ICD-10-CM

## 2015-10-05 DIAGNOSIS — E118 Type 2 diabetes mellitus with unspecified complications: Secondary | ICD-10-CM

## 2015-10-05 DIAGNOSIS — Z853 Personal history of malignant neoplasm of breast: Secondary | ICD-10-CM

## 2015-10-05 DIAGNOSIS — I209 Angina pectoris, unspecified: Secondary | ICD-10-CM | POA: Diagnosis present

## 2015-10-05 DIAGNOSIS — E119 Type 2 diabetes mellitus without complications: Secondary | ICD-10-CM | POA: Diagnosis present

## 2015-10-05 DIAGNOSIS — Z6841 Body Mass Index (BMI) 40.0 and over, adult: Secondary | ICD-10-CM

## 2015-10-05 DIAGNOSIS — E785 Hyperlipidemia, unspecified: Secondary | ICD-10-CM

## 2015-10-05 DIAGNOSIS — I110 Hypertensive heart disease with heart failure: Secondary | ICD-10-CM | POA: Diagnosis present

## 2015-10-05 DIAGNOSIS — J45909 Unspecified asthma, uncomplicated: Secondary | ICD-10-CM | POA: Diagnosis present

## 2015-10-05 DIAGNOSIS — C50319 Malignant neoplasm of lower-inner quadrant of unspecified female breast: Secondary | ICD-10-CM | POA: Diagnosis present

## 2015-10-05 DIAGNOSIS — Z888 Allergy status to other drugs, medicaments and biological substances status: Secondary | ICD-10-CM

## 2015-10-05 DIAGNOSIS — R21 Rash and other nonspecific skin eruption: Secondary | ICD-10-CM

## 2015-10-05 DIAGNOSIS — I959 Hypotension, unspecified: Secondary | ICD-10-CM | POA: Diagnosis present

## 2015-10-05 DIAGNOSIS — Z96653 Presence of artificial knee joint, bilateral: Secondary | ICD-10-CM | POA: Diagnosis present

## 2015-10-05 DIAGNOSIS — Z7984 Long term (current) use of oral hypoglycemic drugs: Secondary | ICD-10-CM

## 2015-10-05 DIAGNOSIS — D860 Sarcoidosis of lung: Secondary | ICD-10-CM | POA: Diagnosis present

## 2015-10-05 LAB — I-STAT CG4 LACTIC ACID, ED: Lactic Acid, Venous: 1.64 mmol/L (ref 0.5–2.0)

## 2015-10-05 LAB — BASIC METABOLIC PANEL
Anion gap: 9 (ref 5–15)
BUN: 11 mg/dL (ref 6–20)
CALCIUM: 8.9 mg/dL (ref 8.9–10.3)
CO2: 32 mmol/L (ref 22–32)
CREATININE: 1.01 mg/dL — AB (ref 0.44–1.00)
Chloride: 99 mmol/L — ABNORMAL LOW (ref 101–111)
GFR calc Af Amer: >60 mL/min (ref >60)
GFR, EST NON AFRICAN AMERICAN: 53 mL/min — AB (ref >60)
GLUCOSE: 159 mg/dL — AB (ref 65–99)
Potassium: 4.1 mmol/L (ref 3.5–5.1)
Sodium: 140 mmol/L (ref 135–145)

## 2015-10-05 LAB — COMPREHENSIVE METABOLIC PANEL
ALK PHOS: 61 U/L (ref 38–126)
ALT: 6 U/L — AB (ref 14–54)
AST: 19 U/L (ref 15–41)
Albumin: 3 g/dL — ABNORMAL LOW (ref 3.5–5.0)
Anion gap: 9 (ref 5–15)
BILIRUBIN TOTAL: 1.2 mg/dL (ref 0.3–1.2)
BUN: 11 mg/dL (ref 6–20)
CALCIUM: 8.5 mg/dL — AB (ref 8.9–10.3)
CO2: 29 mmol/L (ref 22–32)
Chloride: 99 mmol/L — ABNORMAL LOW (ref 101–111)
Creatinine, Ser: 0.94 mg/dL (ref 0.44–1.00)
GFR calc Af Amer: >60 mL/min (ref >60)
GFR, EST NON AFRICAN AMERICAN: 57 mL/min — AB (ref >60)
GLUCOSE: 121 mg/dL — AB (ref 65–99)
POTASSIUM: 4.4 mmol/L (ref 3.5–5.1)
Sodium: 137 mmol/L (ref 135–145)
TOTAL PROTEIN: 6 g/dL — AB (ref 6.5–8.1)

## 2015-10-05 LAB — DIFFERENTIAL
BASOS ABS: 0 10*3/uL (ref 0.0–0.1)
Basophils Relative: 0 %
EOS ABS: 0.4 10*3/uL (ref 0.0–0.7)
Eosinophils Relative: 4 %
LYMPHS ABS: 1.6 10*3/uL (ref 0.7–4.0)
Lymphocytes Relative: 15 %
MONOS PCT: 7 %
Monocytes Absolute: 0.8 10*3/uL (ref 0.1–1.0)
Neutro Abs: 7.6 10*3/uL (ref 1.7–7.7)
Neutrophils Relative %: 74 %

## 2015-10-05 LAB — CBC
HCT: 37 % (ref 36.0–46.0)
Hemoglobin: 11.8 g/dL — ABNORMAL LOW (ref 12.0–15.0)
MCH: 26.6 pg (ref 26.0–34.0)
MCHC: 31.9 g/dL (ref 30.0–36.0)
MCV: 83.5 fL (ref 78.0–100.0)
Platelets: 250 10*3/uL (ref 150–400)
RBC: 4.43 MIL/uL (ref 3.87–5.11)
RDW: 15.8 % — AB (ref 11.5–15.5)
WBC: 10.8 10*3/uL — ABNORMAL HIGH (ref 4.0–10.5)

## 2015-10-05 LAB — TROPONIN I: Troponin I: <0.03 ng/mL (ref <0.031)

## 2015-10-05 LAB — I-STAT TROPONIN, ED: TROPONIN I, POC: 0 ng/mL (ref 0.00–0.08)

## 2015-10-05 LAB — PROTIME-INR
INR: 1.26 (ref 0.00–1.49)
PROTHROMBIN TIME: 16 s — AB (ref 11.6–15.2)

## 2015-10-05 LAB — APTT: aPTT: 40 seconds — ABNORMAL HIGH (ref 24–37)

## 2015-10-05 LAB — PROCALCITONIN: Procalcitonin: <0.1 ng/mL

## 2015-10-05 MED ORDER — INSULIN ASPART 100 UNIT/ML ~~LOC~~ SOLN
0.0000 [IU] | Freq: Three times a day (TID) | SUBCUTANEOUS | Status: DC
  Administered 2015-10-06: 3 [IU] via SUBCUTANEOUS
  Administered 2015-10-07: 2 [IU] via SUBCUTANEOUS
  Administered 2015-10-07 (×2): 3 [IU] via SUBCUTANEOUS
  Administered 2015-10-08: 5 [IU] via SUBCUTANEOUS
  Administered 2015-10-08 (×2): 3 [IU] via SUBCUTANEOUS
  Administered 2015-10-09: 5 [IU] via SUBCUTANEOUS
  Administered 2015-10-09: 8 [IU] via SUBCUTANEOUS

## 2015-10-05 MED ORDER — SODIUM CHLORIDE 0.9 % IV BOLUS (SEPSIS)
250.0000 mL | Freq: Once | INTRAVENOUS | Status: AC
  Administered 2015-10-05: 250 mL via INTRAVENOUS

## 2015-10-05 MED ORDER — ONDANSETRON HCL 4 MG/2ML IJ SOLN: 4.0000 mg | Freq: Four times a day (QID) | INTRAMUSCULAR | Status: DC | PRN

## 2015-10-05 MED ORDER — SODIUM CHLORIDE 0.9 % IV BOLUS (SEPSIS)
500.0000 mL | Freq: Once | INTRAVENOUS | Status: AC
  Administered 2015-10-05: 500 mL via INTRAVENOUS

## 2015-10-05 MED ORDER — ASPIRIN EC 81 MG PO TBEC
81.0000 mg | DELAYED_RELEASE_TABLET | Freq: Every day | ORAL | Status: DC
  Administered 2015-10-05 – 2015-10-08 (×4): 81 mg via ORAL
  Filled 2015-10-05 (×4): qty 1

## 2015-10-05 MED ORDER — VANCOMYCIN HCL 10 G IV SOLR
2000.0000 mg | Freq: Once | INTRAVENOUS | Status: AC
  Administered 2015-10-06: 2000 mg via INTRAVENOUS
  Filled 2015-10-05 (×2): qty 2000

## 2015-10-05 MED ORDER — INSULIN ASPART 100 UNIT/ML ~~LOC~~ SOLN
0.0000 [IU] | Freq: Every day | SUBCUTANEOUS | Status: DC
  Administered 2015-10-05: 0 [IU] via SUBCUTANEOUS

## 2015-10-05 MED ORDER — EXEMESTANE 25 MG PO TABS
25.0000 mg | ORAL_TABLET | Freq: Every day | ORAL | Status: DC
  Administered 2015-10-06 – 2015-10-09 (×4): 25 mg via ORAL
  Filled 2015-10-05 (×6): qty 1

## 2015-10-05 MED ORDER — DONEPEZIL HCL 10 MG PO TABS
10.0000 mg | ORAL_TABLET | Freq: Every day | ORAL | Status: DC
  Administered 2015-10-05 – 2015-10-08 (×4): 10 mg via ORAL
  Filled 2015-10-05 (×4): qty 1

## 2015-10-05 MED ORDER — MICONAZOLE NITRATE POWD
Freq: Four times a day (QID) | Status: DC
  Administered 2015-10-05: 22:00:00 via TOPICAL

## 2015-10-05 MED ORDER — BUDESONIDE-FORMOTEROL FUMARATE 160-4.5 MCG/ACT IN AERO
2.0000 | INHALATION_SPRAY | Freq: Two times a day (BID) | RESPIRATORY_TRACT | Status: DC
  Administered 2015-10-05 – 2015-10-09 (×9): 2 via RESPIRATORY_TRACT
  Filled 2015-10-05 (×2): qty 6

## 2015-10-05 MED ORDER — ACETAMINOPHEN 325 MG PO TABS: 650.0000 mg | ORAL_TABLET | ORAL | Status: DC | PRN

## 2015-10-05 MED ORDER — TRAZODONE HCL 100 MG PO TABS
100.0000 mg | ORAL_TABLET | Freq: Every day | ORAL | Status: DC
  Administered 2015-10-05 – 2015-10-08 (×4): 100 mg via ORAL
  Filled 2015-10-05 (×2): qty 1
  Filled 2015-10-05 (×2): qty 2

## 2015-10-05 MED ORDER — APIXABAN 5 MG PO TABS
5.0000 mg | ORAL_TABLET | Freq: Two times a day (BID) | ORAL | Status: DC
  Administered 2015-10-05 – 2015-10-09 (×8): 5 mg via ORAL
  Filled 2015-10-05 (×9): qty 1

## 2015-10-05 MED ORDER — METOCLOPRAMIDE HCL 5 MG PO TABS
5.0000 mg | ORAL_TABLET | Freq: Two times a day (BID) | ORAL | Status: DC
  Administered 2015-10-05 – 2015-10-09 (×8): 5 mg via ORAL
  Filled 2015-10-05 (×7): qty 1

## 2015-10-05 NOTE — ED Provider Notes (Addendum)
CSN: [id number]     Arrival date & time 10/05/15  1710 History   First MD Initiated Contact with Patient 10/05/15 1724     Chief Complaint  Patient presents with  . Chest Pain     HPI  Expand All Collapse All   Pt had sudden onset of chest pressure in center of chest. Denies any associated symptoms sob n/v. Pt reports no pain at this time. Pt wears 2l [place] as needed at [facility].        Past Medical History  Diagnosis Date  . Asthma   . Bronchitis   . Hernia   . Anemia   . Sarcoidosis ([place])   . Morbid obesity ([place])   . Sarcoid ([place])   . Dyslipidemia   . Cough   . Wheezing   . Chills   . Constipation   . Bruises easily   . Gout attack     ankle, then wrist and hands  . Hypertension   . Atrial fibrillation, persistent ([place]) 11/01/2013  . Chronic anticoagulation, on Eliquis 11/01/2013  . History of nuclear stress test 02/09/2010    dipyridamole; normal pattern of perfusion in all regions with attenuation artifact in inferior region, low risk   . Fracture of left lower leg 06/1972    "no surgery; just casted it"  . CHF (congestive heart failure) ([place])   . Pneumonia 2012; 2014  . Chronic bronchitis ([place])   . OSA on CPAP   . Type II diabetes mellitus ([place])   . Uterine cancer [facility])     s/p hysterectomy  . Breast cancer, stage 1 ([place]) 10/17/2011    s/p right lumpectomy and XRT  . On home oxygen therapy     "2L prn" (01/24/2014)   Past Surgical History  Procedure Laterality Date  . Total knee arthroplasty Bilateral 03/2010; 06/2011    left; right  . Abdominal hysterectomy  02/04/2005  . Umbilical hernia repair  02/04/2005  . Transthoracic echocardiogram  07/2008    EF, LV size is normal; RVSP normal; mod calcif of MV apparatus  . Breast biopsy Right 01/2011  . Breast lumpectomy Right 01/2011  . Tubal ligation  1970's  . Hernia repair     Family History  Problem Relation Age of Onset  . Diabetes Mother   . Colon cancer Father   . Diabetes Brother   . Lung cancer Brother    . Diabetes Sister   . Cervical cancer Sister    Social History  Substance Use Topics  . Smoking status: Never Smoker   . Smokeless tobacco: Never Used  . Alcohol Use: No   OB History    No data available     Review of Systems  All other systems reviewed and are negative.     Allergies  Aspirin; Ibuprofen; Contrast media; and Pravachol  Home Medications   Prior to Admission medications   Medication Sig Start Date End Date Taking? Authorizing Provider  acetaminophen (TYLENOL) 500 MG tablet Take 1,000 mg by mouth every 6 (six) hours as needed. pain   Yes Historical Provider, MD  albuterol (PROVENTIL HFA;VENTOLIN HFA) 108 (90 BASE) MCG/ACT inhaler Inhale 2 puffs into the lungs 4 (four) times daily as needed for wheezing or shortness of breath.   Yes Historical Provider, MD  apixaban (ELIQUIS) 5 MG TABS tablet TAKE 1 TABLET (5 MG TOTAL) BY MOUTH 2 (TWO) TIMES DAILY. 10/22/14  Yes Pixie Casino, MD  Calcium Carbonate-Vitamin D (OYSTER-CAL 500 + D PO)  Take 1 tablet by mouth 2 (two) times daily.   Yes Historical Provider, MD  diltiazem (CARDIZEM CD) 240 MG 24 hr capsule Take 240 mg by mouth every morning.  01/20/14  Yes Historical Provider, MD  Docusate Calcium (STOOL SOFTENER PO) Take 1 capsule by mouth daily as needed (FOR CONSTIPATION).    Yes Historical Provider, MD  donepezil (ARICEPT) 10 MG tablet Take 10 mg by mouth at bedtime.   Yes Historical Provider, MD  exemestane (AROMASIN) 25 MG tablet Take 25 mg by mouth daily after breakfast.   Yes Historical Provider, MD  furosemide (LASIX) 80 MG tablet Take 80 mg by mouth 2 (two) times daily.  01/30/14  Yes Luke K Kilroy, PA-C  glipiZIDE (GLUCOTROL) 10 MG tablet Take 10 mg by mouth daily before breakfast.   Yes Historical Provider, MD  ketoconazole (NIZORAL) 2 % cream Apply 1 application topically daily as needed for irritation.    Yes Historical Provider, MD  losartan (COZAAR) 50 MG tablet Take 50 mg by mouth daily.  01/02/14  Yes  Historical Provider, MD  metFORMIN (GLUCOPHAGE) 500 MG tablet Take 500 mg by mouth 2 (two) times daily with a meal.    Yes Historical Provider, MD  metoCLOPramide (REGLAN) 5 MG tablet Take 5 mg by mouth 2 (two) times daily.   Yes Historical Provider, MD  nystatin cream (MYCOSTATIN) Apply 1 application topically 2 (two) times daily.   Yes Historical Provider, MD  ondansetron (ZOFRAN) 8 MG tablet Take 8 mg by mouth every 8 (eight) hours as needed for nausea or vomiting.   Yes Historical Provider, MD  OVER THE COUNTER MEDICATION Inhale 1 application into the lungs at bedtime. CPAP   Yes Historical Provider, MD  potassium chloride SA (K-DUR,KLOR-CON) 20 MEQ tablet Take 40 mEq by mouth daily.   Yes Historical Provider, MD  SYMBICORT 160-4.5 MCG/ACT inhaler Inhale 2 puffs into the lungs 2 (two) times daily. Rinse mouth well after use 09/04/15  Yes Historical Provider, MD  traZODone (DESYREL) 100 MG tablet Take 100 mg by mouth at bedtime.  08/23/15  Yes Historical Provider, MD  vitamin B-12 (CYANOCOBALAMIN) 1000 MCG tablet Take 1,000 mcg by mouth daily.   Yes Historical Provider, MD  colchicine 0.6 MG tablet Take 1 tablet (0.6 mg total) by mouth 2 (two) times daily. 11/22/12   [name], MD   BP 118/64 mmHg  Pulse 117  Temp(Src) 98.7 F (37.1 C) (Oral)  Resp 19  Ht 4\' 11"  (1.499 m)  Wt 265 lb (120.203 kg)  BMI 53.49 kg/m2  SpO2 96% Physical Exam Physical Exam  Nursing note and vitals reviewed. Constitutional: She is oriented to person, place, and time. She appears well-developed and well-nourished. No distress.  HENT:  Head: Normocephalic and atraumatic.  Eyes: Pupils are equal, round, and reactive to light.  Neck: Normal range of motion.  Cardiovascular: Normal rate and intact distal pulses.   Pulmonary/Chest: No respiratory distress.  Abdominal: Normal appearance. She exhibits no distension.  Musculoskeletal: Normal range of motion.  Neurological: She is alert and oriented to  person, place, and time. No cranial nerve deficit.  Skin: Skin is warm and dry. No rash noted.  Psychiatric: She has a normal mood and affect. Her behavior is normal.    ED Course  Procedures (including critical care time) Medications  sodium chloride 0.9 % bolus 500 mL (0 mLs Intravenous Stopped 10/05/15 1915)  sodium chloride 0.9 % bolus 250 mL (250 mLs Intravenous Given 10/05/15 2345)  Labs Review  Results for orders placed or performed during the hospital encounter of 10/05/15  Culture, blood (x 2)  Result Value Ref Range   Specimen Description BLOOD LEFT HAND    Special Requests BOTTLES DRAWN AEROBIC AND ANAEROBIC 4CC    Culture  Setup Time      GRAM POSITIVE COCCI IN PAIRS IN CLUSTERS AEROBIC BOTTLE ONLY CRITICAL RESULT CALLED TO, READ BACK BY AND VERIFIED WITH: Ave Filter RN 11:50 10/07/15 ([email or web address])    Culture      STAPHYLOCOCCUS SPECIES (COAGULASE NEGATIVE) THE SIGNIFICANCE OF ISOLATING THIS ORGANISM FROM A SINGLE SET OF BLOOD CULTURES WHEN MULTIPLE SETS ARE DRAWN IS UNCERTAIN. PLEASE NOTIFY THE MICROBIOLOGY DEPARTMENT WITHIN ONE WEEK IF SPECIATION AND SENSITIVITIES ARE REQUIRED.    Report Status 10/09/2015 FINAL   Culture, blood (x 2)  Result Value Ref Range   Specimen Description BLOOD RIGHT HAND    Special Requests BOTTLES DRAWN AEROBIC AND ANAEROBIC 5CC     Culture NO GROWTH 5 DAYS    Report Status 10/10/2015 FINAL   MRSA PCR Screening  Result Value Ref Range   MRSA by PCR NEGATIVE NEGATIVE  Urine culture  Result Value Ref Range   Specimen Description URINE, RANDOM    Special Requests NONE    Culture NO GROWTH 1 DAY    Report Status 10/08/2015 FINAL   Basic metabolic panel  Result Value Ref Range   Sodium 140 135 - 145 mmol/L   Potassium 4.1 3.5 - 5.1 mmol/L   Chloride 99 (L) 101 - 111 mmol/L   CO2 32 22 - 32 mmol/L   Glucose, Bld 159 (H) 65 - 99 mg/dL   BUN 11 6 - 20 mg/dL   Creatinine, Ser 1.01 (H) 0.44 - 1.00 mg/dL   Calcium 8.9 8.9 - 10.3 mg/dL    GFR calc non Af Amer 53 (L) >60 mL/min   GFR calc Af Amer >60 >60 mL/min   Anion gap 9 5 - 15  CBC  Result Value Ref Range   WBC 10.8 (H) 4.0 - 10.5 K/uL   RBC 4.43 3.87 - 5.11 MIL/uL   Hemoglobin 11.8 (L) 12.0 - 15.0 g/dL   HCT 37.0 36.0 - 46.0 %   MCV 83.5 78.0 - 100.0 fL   MCH 26.6 26.0 - 34.0 pg   MCHC 31.9 30.0 - 36.0 g/dL   RDW 15.8 (H) 11.5 - 15.5 %   Platelets 250 150 - 400 K/uL  Comprehensive metabolic panel  Result Value Ref Range   Sodium 137 135 - 145 mmol/L   Potassium 4.4 3.5 - 5.1 mmol/L   Chloride 99 (L) 101 - 111 mmol/L   CO2 29 22 - 32 mmol/L   Glucose, Bld 121 (H) 65 - 99 mg/dL   BUN 11 6 - 20 mg/dL   Creatinine, Ser 0.94 0.44 - 1.00 mg/dL   Calcium 8.5 (L) 8.9 - 10.3 mg/dL   Total Protein 6.0 (L) 6.5 - 8.1 g/dL   Albumin 3.0 (L) 3.5 - 5.0 g/dL   AST 19 15 - 41 U/L   ALT 6 (L) 14 - 54 U/L   Alkaline Phosphatase 61 38 - 126 U/L   Total Bilirubin 1.2 0.3 - 1.2 mg/dL   GFR calc non Af Amer 57 (L) >60 mL/min   GFR calc Af Amer >60 >60 mL/min   Anion gap 9 5 - 15  Lactic acid, plasma  Result Value Ref Range   Lactic Acid, Venous 1.3 0.5 - 2.0  mmol/L  Procalcitonin  Result Value Ref Range   Procalcitonin <0.10 ng/mL  Protime-INR  Result Value Ref Range   Prothrombin Time 16.0 (H) 11.6 - 15.2 seconds   INR 1.26 0.00 - 1.49  APTT  Result Value Ref Range   aPTT 40 (H) 24 - 37 seconds  Hemoglobin A1c  Result Value Ref Range   Hgb A1c MFr Bld 5.9 (H) 4.8 - 5.6 %   Mean Plasma Glucose 123 mg/dL  Troponin I  Result Value Ref Range   Troponin I <0.03 <0.031 ng/mL  Troponin I  Result Value Ref Range   Troponin I <0.03 <0.031 ng/mL  Troponin I  Result Value Ref Range   Troponin I <0.03 <0.031 ng/mL  Differential  Result Value Ref Range   Neutrophils Relative % 74 %   Neutro Abs 7.6 1.7 - 7.7 K/uL   Lymphocytes Relative 15 %   Lymphs Abs 1.6 0.7 - 4.0 K/uL   Monocytes Relative 7 %   Monocytes Absolute 0.8 0.1 - 1.0 K/uL   Eosinophils Relative 4 %    Eosinophils Absolute 0.4 0.0 - 0.7 K/uL   Basophils Relative 0 %   Basophils Absolute 0.0 0.0 - 0.1 K/uL  CBC with Differential/Platelet  Result Value Ref Range   WBC 8.1 4.0 - 10.5 K/uL   RBC 4.14 3.87 - 5.11 MIL/uL   Hemoglobin 11.1 (L) 12.0 - 15.0 g/dL   HCT 34.8 (L) 36.0 - 46.0 %   MCV 84.1 78.0 - 100.0 fL   MCH 26.8 26.0 - 34.0 pg   MCHC 31.9 30.0 - 36.0 g/dL   RDW 15.8 (H) 11.5 - 15.5 %   Platelets 214 150 - 400 K/uL   Neutrophils Relative % 71 %   Neutro Abs 5.8 1.7 - 7.7 K/uL   Lymphocytes Relative 19 %   Lymphs Abs 1.5 0.7 - 4.0 K/uL   Monocytes Relative 6 %   Monocytes Absolute 0.5 0.1 - 1.0 K/uL   Eosinophils Relative 4 %   Eosinophils Absolute 0.3 0.0 - 0.7 K/uL   Basophils Relative 0 %   Basophils Absolute 0.0 0.0 - 0.1 K/uL  Comprehensive metabolic panel  Result Value Ref Range   Sodium 141 135 - 145 mmol/L   Potassium 4.2 3.5 - 5.1 mmol/L   Chloride 101 101 - 111 mmol/L   CO2 34 (H) 22 - 32 mmol/L   Glucose, Bld 130 (H) 65 - 99 mg/dL   BUN 8 6 - 20 mg/dL   Creatinine, Ser 0.97 0.44 - 1.00 mg/dL   Calcium 8.6 (L) 8.9 - 10.3 mg/dL   Total Protein 5.6 (L) 6.5 - 8.1 g/dL   Albumin 2.7 (L) 3.5 - 5.0 g/dL   AST 12 (L) 15 - 41 U/L   ALT 9 (L) 14 - 54 U/L   Alkaline Phosphatase 59 38 - 126 U/L   Total Bilirubin 0.8 0.3 - 1.2 mg/dL   GFR calc non Af Amer 55 (L) >60 mL/min   GFR calc Af Amer >60 >60 mL/min   Anion gap 6 5 - 15  Protime-INR  Result Value Ref Range   Prothrombin Time 17.2 (H) 11.6 - 15.2 seconds   INR 1.39 0.00 - 1.49  Glucose, capillary  Result Value Ref Range   Glucose-Capillary 122 (H) 65 - 99 mg/dL  Glucose, capillary  Result Value Ref Range   Glucose-Capillary 122 (H) 65 - 99 mg/dL  Urinalysis, Routine w reflex microscopic (not at [facility])  Result Value  Ref Range   Color, Urine YELLOW YELLOW   APPearance CLEAR CLEAR   Specific Gravity, Urine 1.014 1.005 - 1.030   pH 6.0 5.0 - 8.0   Glucose, UA NEGATIVE NEGATIVE mg/dL   Hgb urine  dipstick NEGATIVE NEGATIVE   Bilirubin Urine NEGATIVE NEGATIVE   Ketones, ur NEGATIVE NEGATIVE mg/dL   Protein, ur NEGATIVE NEGATIVE mg/dL   Urobilinogen, UA 1.0 0.0 - 1.0 mg/dL   Nitrite NEGATIVE NEGATIVE   Leukocytes, UA SMALL (A) NEGATIVE  Urine microscopic-add on  Result Value Ref Range   Squamous Epithelial / LPF FEW (A) RARE   WBC, UA 11-20 <3 WBC/hpf   RBC / HPF 0-2 <3 RBC/hpf   Bacteria, UA FEW (A) RARE  Glucose, capillary  Result Value Ref Range   Glucose-Capillary 129 (H) 65 - 99 mg/dL  Glucose, capillary  Result Value Ref Range   Glucose-Capillary 173 (H) 65 - 99 mg/dL  Glucose, capillary  Result Value Ref Range   Glucose-Capillary 127 (H) 65 - 99 mg/dL   Comment 1 Document in Chart   Glucose, capillary  Result Value Ref Range   Glucose-Capillary 145 (H) 65 - 99 mg/dL  Basic metabolic panel  Result Value Ref Range   Sodium 136 135 - 145 mmol/L   Potassium 3.4 (L) 3.5 - 5.1 mmol/L   Chloride 98 (L) 101 - 111 mmol/L   CO2 32 22 - 32 mmol/L   Glucose, Bld 139 (H) 65 - 99 mg/dL   BUN 6 6 - 20 mg/dL   Creatinine, Ser 0.77 0.44 - 1.00 mg/dL   Calcium 8.2 (L) 8.9 - 10.3 mg/dL   GFR calc non Af Amer >60 >60 mL/min   GFR calc Af Amer >60 >60 mL/min   Anion gap 6 5 - 15  CBC  Result Value Ref Range   WBC 8.7 4.0 - 10.5 K/uL   RBC 4.10 3.87 - 5.11 MIL/uL   Hemoglobin 10.9 (L) 12.0 - 15.0 g/dL   HCT 34.4 (L) 36.0 - 46.0 %   MCV 83.9 78.0 - 100.0 fL   MCH 26.6 26.0 - 34.0 pg   MCHC 31.7 30.0 - 36.0 g/dL   RDW 15.7 (H) 11.5 - 15.5 %   Platelets 216 150 - 400 K/uL  Glucose, capillary  Result Value Ref Range   Glucose-Capillary 167 (H) 65 - 99 mg/dL  Glucose, capillary  Result Value Ref Range   Glucose-Capillary 167 (H) 65 - 99 mg/dL  Glucose, capillary  Result Value Ref Range   Glucose-Capillary 219 (H) 65 - 99 mg/dL  Glucose, capillary  Result Value Ref Range   Glucose-Capillary 157 (H) 65 - 99 mg/dL  Glucose, capillary  Result Value Ref Range    Glucose-Capillary 163 (H) 65 - 99 mg/dL  Glucose, capillary  Result Value Ref Range   Glucose-Capillary 243 (H) 65 - 99 mg/dL  Basic metabolic panel  Result Value Ref Range   Sodium 138 135 - 145 mmol/L   Potassium 3.9 3.5 - 5.1 mmol/L   Chloride 97 (L) 101 - 111 mmol/L   CO2 31 22 - 32 mmol/L   Glucose, Bld 176 (H) 65 - 99 mg/dL   BUN 6 6 - 20 mg/dL   Creatinine, Ser 0.78 0.44 - 1.00 mg/dL   Calcium 8.7 (L) 8.9 - 10.3 mg/dL   GFR calc non Af Amer >60 >60 mL/min   GFR calc Af Amer >60 >60 mL/min   Anion gap 10 5 - 15  CBC  Result Value Ref Range   WBC 11.1 (H) 4.0 - 10.5 K/uL   RBC 4.16 3.87 - 5.11 MIL/uL   Hemoglobin 11.4 (L) 12.0 - 15.0 g/dL   HCT 34.7 (L) 36.0 - 46.0 %   MCV 83.4 78.0 - 100.0 fL   MCH 27.4 26.0 - 34.0 pg   MCHC 32.9 30.0 - 36.0 g/dL   RDW 15.6 (H) 11.5 - 15.5 %   Platelets 220 150 - 400 K/uL  Glucose, capillary  Result Value Ref Range   Glucose-Capillary 160 (H) 65 - 99 mg/dL  Glucose, capillary  Result Value Ref Range   Glucose-Capillary 224 (H) 65 - 99 mg/dL  Glucose, capillary  Result Value Ref Range   Glucose-Capillary 194 (H) 65 - 99 mg/dL  Glucose, capillary  Result Value Ref Range   Glucose-Capillary 192 (H) 65 - 99 mg/dL  Glucose, capillary  Result Value Ref Range   Glucose-Capillary 277 (H) 65 - 99 mg/dL  Glucose, capillary  Result Value Ref Range   Glucose-Capillary 235 (H) 65 - 99 mg/dL  I-stat troponin, ED  Result Value Ref Range   Troponin i, poc 0.00 0.00 - 0.08 ng/mL   Comment 3          I-Stat CG4 Lactic Acid, ED  Result Value Ref Range   Lactic Acid, Venous 1.64 0.5 - 2.0 mmol/L   No results found.   Dg Chest Portable 1 View  10/05/2015  CLINICAL DATA:  Generalized chest pain EXAM: PORTABLE CHEST 1 VIEW COMPARISON:  03/19/2015 FINDINGS: Cardiomediastinal silhouette is stable. No acute infiltrate or pleural effusion. No pulmonary edema. No pneumothorax. IMPRESSION: No active disease. Electronically Signed   By: Lahoma Crocker  M.D.   On: 10/05/2015 18:24      EKG Interpretation  Date/Time:  Monday October 05 2015 17:20:04 EDT Ventricular Rate:  68 PR Interval:    QRS Duration: 92 QT Interval:  523 QTC Calculation: 556 R Axis:   0 Text Interpretation:  Atrial fibrillation Indeterminate axis Low voltage, extremity and precordial leads Minimal ST depression, inferior leads Prolonged QT interval ED PHYSICIAN INTERPRETATION AVAILABLE IN [facility] Confirmed by TEST, Record ([id number]) on 10/08/2015 9:30:55 AM  MDM   Final diagnoses:  Chest pain, unspecified chest pain type        Leonard Schwartz, MD 10/09/15 1103  Leonard Schwartz, MD 01/09/16 [phone]

## 2015-10-05 NOTE — ED Notes (Signed)
Provided pt with pericare to clean up one small bowel movement. Pt remains monitored by blood pressure, pulse ox, and 12 lead. pts family remains at bedside.

## 2015-10-05 NOTE — ED Notes (Signed)
Portable xray at bedside.

## 2015-10-05 NOTE — Consult Note (Signed)
ANTIBIOTIC CONSULT NOTE - INITIAL  Pharmacy Consult for Vancomycin Indication: cellulitis  Allergies  Allergen Reactions  . Aspirin Other (See Comments)    Avoids due to being on blood thinners  . Ibuprofen Other (See Comments)    Avoids due to being on blood thinners  . Contrast Media [Iodinated Diagnostic Agents] Nausea And Vomiting  . Pravachol Other (See Comments)    Muscle pain    Patient Measurements: Height: 4\' 11"  (149.9 cm) Weight: 264 lb (119.75 kg) IBW/kg (Calculated) : 43.2  Vital Signs: Temp: 97.9 F (36.6 C) (10/24 1718) Temp Source: Oral (10/24 1718) BP: 103/45 mmHg (10/24 2032) Pulse Rate: 76 (10/24 2032) Intake/Output from previous day:   Intake/Output from this shift: Total I/O In: 500 [I.V.:500] Out: -   Labs:  Recent Labs  10/05/15 1748  WBC 10.8*  HGB 11.8*  PLT 250  CREATININE 1.01*   Estimated Creatinine Clearance: 55.2 mL/min (by C-G formula based on Cr of 1.01).  Microbiology: No results found for this or any previous visit (from the past 720 hour(s)).  Medical History: Past Medical History  Diagnosis Date  . Asthma   . Bronchitis   . Hernia   . Anemia   . Sarcoidosis ([place])   . Morbid obesity ([place])   . Sarcoid ([place])   . Dyslipidemia   . Cough   . Wheezing   . Chills   . Constipation   . Bruises easily   . Gout attack     ankle, then wrist and hands  . Hypertension   . Atrial fibrillation, persistent ([place]) 11/01/2013  . Chronic anticoagulation, on Eliquis 11/01/2013  . History of nuclear stress test 02/09/2010    dipyridamole; normal pattern of perfusion in all regions with attenuation artifact in inferior region, low risk   . Fracture of left lower leg 06/1972    "no surgery; just casted it"  . CHF (congestive heart failure) ([place])   . Pneumonia 2012; 2014  . Chronic bronchitis ([place])   . OSA on CPAP   . Type II diabetes mellitus ([place])   . Uterine cancer [facility])     s/p hysterectomy  . Breast cancer, stage 1 ([place])  10/17/2011    s/p right lumpectomy and XRT  . On home oxygen therapy     "2L prn" (01/24/2014)   Assessment: [name] to begin vancomycin for cellulitis. Serum creatinine is slightly elevated at 1.01 (baseline 0.6-0.8). Will use the obesity nomogram given weight of 119.8kg.  Goal of Therapy:  Vancomycin trough level 10-15 mcg/ml  Plan:  1) Vancomycin 2g IV x 1 then 1250mg  IV q24 2) Follow renal function, cultures, LOT, level if needed  [name] 10/05/2015,9:40 PM

## 2015-10-05 NOTE — ED Notes (Signed)
Pt placed into gown and on monitor upon arrival to room. Pt monitored by blood pressure, pulse ox, and 12 lead. pts EKG given to and signed by Dr. Audie Pinto

## 2015-10-05 NOTE — Progress Notes (Signed)
Placed pt on cpap, pt tolerating well at this time.  

## 2015-10-05 NOTE — ED Notes (Signed)
Pt had sudden onset of chest pressure in center of chest. Denies any associated symptoms sob n/v. Pt reports no pain at this time. Pt wears 2l [place] as needed at [facility].

## 2015-10-05 NOTE — ED Notes (Signed)
Dr.Beaton made aware of bp 69/46. No new orders at this time. Pt is alert and ox4 with no complaints at this time. Pt placed in Trendelenburg.

## 2015-10-05 NOTE — ED Notes (Signed)
Pt remains monitored by blood pressure, pulse ox, and 12 lead. pts family remains at bedside.  

## 2015-10-05 NOTE — H&P (Signed)
[facility] History and Physical  Patient: [name]  MRN: [id number]  DOB: Oct 31, 1941  DOS: the patient was seen and examined on   10/05/2015 PCP: Shirline Frees, MD  Referring physician: Dr. Audie Pinto Chief Complaint: Chest pain  HPI: [name] is a 74 y.o. female with Past medical history of sarcoidosis, pulmonary hypertension, chronic diastolic dysfunction, essential hypertension, chronic A. fib on chronic anticoagulation, dyslipidemia, obstructive sleep apnea on C Pap, chronic hypoxic respiratory failure with home oxygen, type 2 diabetes mellitus. The patient presented with complaints of chest pain. The patient mentions that she was at her baseline but at around 2 PM she started having complaints of burning chest pain which was across her chest and this was associated with some shortness of breath. She denied any fever or chills no nausea no vomiting and no cough. On the acid reflux no diarrhea no constipation. No abdominal pain no worsening leg swelling. She denies any recent change in her medication. At the time of my evaluation she was chest pain-free.  The patient is coming from SNF At her baseline ambulates with walker And is independent for most of her ADL; manages her medication on her own.  Review of Systems: as mentioned in the history of present illness.  A comprehensive review of the other systems is negative.  Past Medical History  Diagnosis Date  . Asthma   . Bronchitis   . Hernia   . Anemia   . Sarcoidosis ([place])   . Morbid obesity ([place])   . Sarcoid ([place])   . Dyslipidemia   . Cough   . Wheezing   . Chills   . Constipation   . Bruises easily   . Gout attack     ankle, then wrist and hands  . Hypertension   . Atrial fibrillation, persistent ([place]) 11/01/2013  . Chronic anticoagulation, on Eliquis 11/01/2013  . History of nuclear stress test 02/09/2010    dipyridamole; normal pattern of perfusion in all regions with attenuation artifact in  inferior region, low risk   . Fracture of left lower leg 06/1972    "no surgery; just casted it"  . CHF (congestive heart failure) ([place])   . Pneumonia 2012; 2014  . Chronic bronchitis ([place])   . OSA on CPAP   . Type II diabetes mellitus ([place])   . Uterine cancer [facility])     s/p hysterectomy  . Breast cancer, stage 1 ([place]) 10/17/2011    s/p right lumpectomy and XRT  . On home oxygen therapy     "2L prn" (01/24/2014)   Past Surgical History  Procedure Laterality Date  . Total knee arthroplasty Bilateral 03/2010; 06/2011    left; right  . Abdominal hysterectomy  02/04/2005  . Umbilical hernia repair  02/04/2005  . Transthoracic echocardiogram  07/2008    EF, LV size is normal; RVSP normal; mod calcif of MV apparatus  . Breast biopsy Right 01/2011  . Breast lumpectomy Right 01/2011  . Tubal ligation  1970's  . Hernia repair     Social History:  reports that she has never smoked. She has never used smokeless tobacco. She reports that she does not drink alcohol or use illicit drugs.  Allergies  Allergen Reactions  . Aspirin Other (See Comments)    Avoids due to being on blood thinners  . Ibuprofen Other (See Comments)    Avoids due to being on blood thinners  . Contrast Media [Iodinated Diagnostic Agents] Nausea And Vomiting  . Pravachol Other (  See Comments)    Muscle pain    Family History  Problem Relation Age of Onset  . Diabetes Mother   . Colon cancer Father   . Diabetes Brother   . Lung cancer Brother   . Diabetes Sister   . Cervical cancer Sister     Prior to Admission medications   Medication Sig Start Date End Date Taking? Authorizing Provider  acetaminophen (TYLENOL) 500 MG tablet Take 1,000 mg by mouth every 6 (six) hours as needed. pain   Yes Historical Provider, MD  albuterol (PROVENTIL HFA;VENTOLIN HFA) 108 (90 BASE) MCG/ACT inhaler Inhale 2 puffs into the lungs 4 (four) times daily as needed for wheezing or shortness of breath.   Yes Historical Provider, MD  apixaban  (ELIQUIS) 5 MG TABS tablet TAKE 1 TABLET (5 MG TOTAL) BY MOUTH 2 (TWO) TIMES DAILY. 10/22/14  Yes Pixie Casino, MD  Calcium Carbonate-Vitamin D (OYSTER-CAL 500 + D PO) Take 1 tablet by mouth 2 (two) times daily.   Yes Historical Provider, MD  diltiazem (CARDIZEM CD) 240 MG 24 hr capsule Take 240 mg by mouth every morning.  01/20/14  Yes Historical Provider, MD  Docusate Calcium (STOOL SOFTENER PO) Take 1 capsule by mouth daily as needed (FOR CONSTIPATION).    Yes Historical Provider, MD  donepezil (ARICEPT) 10 MG tablet Take 10 mg by mouth at bedtime.   Yes Historical Provider, MD  exemestane (AROMASIN) 25 MG tablet Take 25 mg by mouth daily after breakfast.   Yes Historical Provider, MD  furosemide (LASIX) 80 MG tablet Take 80 mg by mouth 2 (two) times daily.  01/30/14  Yes Luke K Kilroy, PA-C  glipiZIDE (GLUCOTROL) 10 MG tablet Take 10 mg by mouth daily before breakfast.   Yes Historical Provider, MD  ketoconazole (NIZORAL) 2 % cream Apply 1 application topically daily as needed for irritation.    Yes Historical Provider, MD  losartan (COZAAR) 50 MG tablet Take 50 mg by mouth daily.  01/02/14  Yes Historical Provider, MD  metFORMIN (GLUCOPHAGE) 500 MG tablet Take 500 mg by mouth 2 (two) times daily with a meal.    Yes Historical Provider, MD  metoCLOPramide (REGLAN) 5 MG tablet Take 5 mg by mouth 2 (two) times daily.   Yes Historical Provider, MD  nystatin cream (MYCOSTATIN) Apply 1 application topically 2 (two) times daily.   Yes Historical Provider, MD  ondansetron (ZOFRAN) 8 MG tablet Take 8 mg by mouth every 8 (eight) hours as needed for nausea or vomiting.   Yes Historical Provider, MD  OVER THE COUNTER MEDICATION Inhale 1 application into the lungs at bedtime. CPAP   Yes Historical Provider, MD  potassium chloride SA (K-DUR,KLOR-CON) 20 MEQ tablet Take 40 mEq by mouth daily.   Yes Historical Provider, MD  SYMBICORT 160-4.5 MCG/ACT inhaler Inhale 2 puffs into the lungs 2 (two) times daily.  Rinse mouth well after use 09/04/15  Yes Historical Provider, MD  traZODone (DESYREL) 100 MG tablet Take 100 mg by mouth at bedtime.  08/23/15  Yes Historical Provider, MD  vitamin B-12 (CYANOCOBALAMIN) 1000 MCG tablet Take 1,000 mcg by mouth daily.   Yes Historical Provider, MD  colchicine 0.6 MG tablet Take 1 tablet (0.6 mg total) by mouth 2 (two) times daily. 11/22/12   Elmarie Shiley, MD    Physical Exam: Filed Vitals:   10/05/15 1900 10/05/15 2000 10/05/15 2030 10/05/15 2032  BP:  109/40 103/45 103/45  Pulse:  69 92 76  Temp:  TempSrc:      Resp: 17 19  16   Height:      Weight:      SpO2:  99% 97% 98%    General: Alert, Awake and Oriented to Time, Place and Person. Appear in mild distress Eyes: PERRL ENT: Oral Mucosa clear moist. Neck: no JVD Cardiovascular: S1 and S2 Present, no Murmur, Peripheral Pulses Present Respiratory: Bilateral Air entry equal and Decreased,  Clear to Auscultation, no Crackles, no wheezes Abdomen: Bowel Sound prsent, Soft and no tenderness Skin: no Rash Extremities: no Pedal edema, no calf tenderness Neurologic: Grossly no focal neuro deficit.  Labs on Admission:  CBC:  Recent Labs Lab 10/05/15 1748  WBC 10.8*  HGB 11.8*  HCT 37.0  MCV 83.5  PLT 250    CMP     Component Value Date/Time   NA 140 10/05/2015 1748   NA 142 10/02/2014 1302   K 4.1 10/05/2015 1748   K 4.3 10/02/2014 1302   CL 99* 10/05/2015 1748   CL 98 03/08/2013 0959   CO2 32 10/05/2015 1748   CO2 30* 10/02/2014 1302   GLUCOSE 159* 10/05/2015 1748   GLUCOSE 210* 10/02/2014 1302   GLUCOSE 202* 03/08/2013 0959   BUN 11 10/05/2015 1748   BUN 19.7 10/02/2014 1302   CREATININE 1.01* 10/05/2015 1748   CREATININE 0.8 10/02/2014 1302   CREATININE 0.82 03/24/2014 1757   CALCIUM 8.9 10/05/2015 1748   CALCIUM 9.9 10/02/2014 1302   PROT 6.5 10/02/2014 1302   PROT 7.2 04/17/2014 1747   ALBUMIN 3.7 10/02/2014 1302   ALBUMIN 3.9 04/17/2014 1747   AST 18 10/02/2014  1302   AST 17 04/17/2014 1747   ALT 21 10/02/2014 1302   ALT 24 04/17/2014 1747   ALKPHOS 101 10/02/2014 1302   ALKPHOS 245* 04/17/2014 1747   BILITOT 0.60 10/02/2014 1302   BILITOT 0.7 04/17/2014 1747   GFRNONAA 53* 10/05/2015 1748   GFRNONAA 63 03/20/2014 1537   GFRAA >60 10/05/2015 1748   GFRAA 73 03/20/2014 1537    No results for input(s): CKTOTAL, CKMB, CKMBINDEX, TROPONINI in the last 168 hours. BNP (last 3 results) No results for input(s): BNP in the last 8760 hours.  ProBNP (last 3 results) No results for input(s): PROBNP in the last 8760 hours.   Radiological Exams on Admission: Dg Chest Portable 1 View  10/05/2015  CLINICAL DATA:  Generalized chest pain EXAM: PORTABLE CHEST 1 VIEW COMPARISON:  03/19/2015 FINDINGS: Cardiomediastinal silhouette is stable. No acute infiltrate or pleural effusion. No pulmonary edema. No pneumothorax. IMPRESSION: No active disease. Electronically Signed   By: Lahoma Crocker M.D.   On: 10/05/2015 18:24   EKG: Independently reviewed. atrial fibrillation, rate controlled.  Assessment/Plan 1. Chest pain Patient presented with numbness of chest pain. EKG unremarkable chest x-ray is also unremarkable and initial troponin is negative. The patient is oriented anticoagulation therefore possibility of PE is less likely. With this the patient will be admitted in the step down unit. We'll monitor her closely. Follow serial troponin  In the morning. Patient remains nothing by mouth except medication after midnight.  2.hypotension. Sepsis secondary to cellulitis.  The patient is also having hypotension etiology currently unclear but she found to be having cellulitis of the groin skin, this most likely appears to be secondarily infected fungal rash. The patient will be treated with topical antifungal cream as well as empirical vancomycin. We will get sepsis workup.  3 PULMONARY SARCOIDOSIS   Obesity hypoventilation syndrome ([place]) At present  appears to be stable. We will continue close monitoring and continue C Pap continue oxygen.  4  Breast cancer of lower-inner quadrant of right female breast ([place]) Possibility of PE cannot be ruled out although the patient appears to be on chronic anticoagulation and appears to be not tachycardic or hypoxic than her U baseline. We'll continue close monitoring.  5  Atrial fibrillation, persistent ([facility])   Chronic anticoagulation, on Eliquis Continuing anticoagulation. Holding the computer medication in the setting of hypertension.  6  Chronic diastolic CHF (congestive heart failure) ([facility]) Blood pressure at present stable. Holding diuretic in the setting of hypertension.  7  Diabetes mellitus, type II ([place]) Holding her home insulin regimen and placing her on sliding scale insulin.  8 dementia  Chronic and currently without any behavioral disturbances. We will continue home medication.  Nutrition: Nothing by mouth except medication after midnight DVT Prophylaxis: on therapeutic anticoagulation.  Advance goals of care discussion: Full code as 1 discussion with power of attorney   Family Communication: family was present at bedside, opportunity was given to ask question and all questions were answered satisfactorily at the time of interview. Disposition: Admitted as observation, step-down unit.  Author: Berle Mull, MD [facility] Pager: [phone] 10/05/2015  If 7PM-7AM, please contact night-coverage [email or web address] Password [email or web address]

## 2015-10-05 NOTE — ED Notes (Signed)
Hospitaliast at the bedside.

## 2015-10-06 ENCOUNTER — Observation Stay (HOSPITAL_COMMUNITY): Payer: Medicare Other

## 2015-10-06 DIAGNOSIS — D860 Sarcoidosis of lung: Secondary | ICD-10-CM | POA: Diagnosis present

## 2015-10-06 DIAGNOSIS — B372 Candidiasis of skin and nail: Secondary | ICD-10-CM | POA: Diagnosis present

## 2015-10-06 DIAGNOSIS — Z96653 Presence of artificial knee joint, bilateral: Secondary | ICD-10-CM | POA: Diagnosis present

## 2015-10-06 DIAGNOSIS — Z7901 Long term (current) use of anticoagulants: Secondary | ICD-10-CM | POA: Diagnosis not present

## 2015-10-06 DIAGNOSIS — Z91041 Radiographic dye allergy status: Secondary | ICD-10-CM | POA: Diagnosis not present

## 2015-10-06 DIAGNOSIS — I482 Chronic atrial fibrillation: Secondary | ICD-10-CM | POA: Diagnosis present

## 2015-10-06 DIAGNOSIS — J45909 Unspecified asthma, uncomplicated: Secondary | ICD-10-CM | POA: Diagnosis present

## 2015-10-06 DIAGNOSIS — Z7984 Long term (current) use of oral hypoglycemic drugs: Secondary | ICD-10-CM | POA: Diagnosis not present

## 2015-10-06 DIAGNOSIS — Z79899 Other long term (current) drug therapy: Secondary | ICD-10-CM | POA: Diagnosis not present

## 2015-10-06 DIAGNOSIS — F0390 Unspecified dementia without behavioral disturbance: Secondary | ICD-10-CM | POA: Diagnosis present

## 2015-10-06 DIAGNOSIS — N390 Urinary tract infection, site not specified: Secondary | ICD-10-CM | POA: Diagnosis present

## 2015-10-06 DIAGNOSIS — J9611 Chronic respiratory failure with hypoxia: Secondary | ICD-10-CM | POA: Diagnosis present

## 2015-10-06 DIAGNOSIS — C50311 Malignant neoplasm of lower-inner quadrant of right female breast: Secondary | ICD-10-CM | POA: Diagnosis not present

## 2015-10-06 DIAGNOSIS — I481 Persistent atrial fibrillation: Secondary | ICD-10-CM

## 2015-10-06 DIAGNOSIS — Z794 Long term (current) use of insulin: Secondary | ICD-10-CM | POA: Diagnosis not present

## 2015-10-06 DIAGNOSIS — Z6841 Body Mass Index (BMI) 40.0 and over, adult: Secondary | ICD-10-CM | POA: Diagnosis not present

## 2015-10-06 DIAGNOSIS — Z886 Allergy status to analgesic agent status: Secondary | ICD-10-CM | POA: Diagnosis not present

## 2015-10-06 DIAGNOSIS — G4733 Obstructive sleep apnea (adult) (pediatric): Secondary | ICD-10-CM | POA: Diagnosis present

## 2015-10-06 DIAGNOSIS — I272 Other secondary pulmonary hypertension: Secondary | ICD-10-CM | POA: Diagnosis present

## 2015-10-06 DIAGNOSIS — Z90710 Acquired absence of both cervix and uterus: Secondary | ICD-10-CM | POA: Diagnosis not present

## 2015-10-06 DIAGNOSIS — E119 Type 2 diabetes mellitus without complications: Secondary | ICD-10-CM | POA: Diagnosis present

## 2015-10-06 DIAGNOSIS — R079 Chest pain, unspecified: Secondary | ICD-10-CM | POA: Diagnosis not present

## 2015-10-06 DIAGNOSIS — I209 Angina pectoris, unspecified: Secondary | ICD-10-CM | POA: Diagnosis present

## 2015-10-06 DIAGNOSIS — Z853 Personal history of malignant neoplasm of breast: Secondary | ICD-10-CM | POA: Diagnosis not present

## 2015-10-06 DIAGNOSIS — C50319 Malignant neoplasm of lower-inner quadrant of unspecified female breast: Secondary | ICD-10-CM | POA: Diagnosis present

## 2015-10-06 DIAGNOSIS — J449 Chronic obstructive pulmonary disease, unspecified: Secondary | ICD-10-CM | POA: Diagnosis present

## 2015-10-06 DIAGNOSIS — Z888 Allergy status to other drugs, medicaments and biological substances status: Secondary | ICD-10-CM | POA: Diagnosis not present

## 2015-10-06 DIAGNOSIS — I110 Hypertensive heart disease with heart failure: Secondary | ICD-10-CM | POA: Diagnosis present

## 2015-10-06 DIAGNOSIS — Z8542 Personal history of malignant neoplasm of other parts of uterus: Secondary | ICD-10-CM | POA: Diagnosis not present

## 2015-10-06 DIAGNOSIS — E662 Morbid (severe) obesity with alveolar hypoventilation: Secondary | ICD-10-CM | POA: Diagnosis present

## 2015-10-06 DIAGNOSIS — Z9981 Dependence on supplemental oxygen: Secondary | ICD-10-CM | POA: Diagnosis not present

## 2015-10-06 DIAGNOSIS — A419 Sepsis, unspecified organism: Secondary | ICD-10-CM | POA: Diagnosis present

## 2015-10-06 DIAGNOSIS — I071 Rheumatic tricuspid insufficiency: Secondary | ICD-10-CM | POA: Diagnosis present

## 2015-10-06 DIAGNOSIS — I5032 Chronic diastolic (congestive) heart failure: Secondary | ICD-10-CM | POA: Diagnosis present

## 2015-10-06 DIAGNOSIS — R0789 Other chest pain: Secondary | ICD-10-CM | POA: Diagnosis present

## 2015-10-06 DIAGNOSIS — L03314 Cellulitis of groin: Secondary | ICD-10-CM | POA: Diagnosis present

## 2015-10-06 DIAGNOSIS — E785 Hyperlipidemia, unspecified: Secondary | ICD-10-CM | POA: Diagnosis present

## 2015-10-06 LAB — PROTIME-INR
INR: 1.39 (ref 0.00–1.49)
Prothrombin Time: 17.2 seconds — ABNORMAL HIGH (ref 11.6–15.2)

## 2015-10-06 LAB — CBC WITH DIFFERENTIAL/PLATELET
Basophils Absolute: 0 10*3/uL (ref 0.0–0.1)
Basophils Relative: 0 %
Eosinophils Absolute: 0.3 10*3/uL (ref 0.0–0.7)
Eosinophils Relative: 4 %
HCT: 34.8 % — ABNORMAL LOW (ref 36.0–46.0)
Hemoglobin: 11.1 g/dL — ABNORMAL LOW (ref 12.0–15.0)
Lymphocytes Relative: 19 %
Lymphs Abs: 1.5 10*3/uL (ref 0.7–4.0)
MCH: 26.8 pg (ref 26.0–34.0)
MCHC: 31.9 g/dL (ref 30.0–36.0)
MCV: 84.1 fL (ref 78.0–100.0)
Monocytes Absolute: 0.5 10*3/uL (ref 0.1–1.0)
Monocytes Relative: 6 %
Neutro Abs: 5.8 10*3/uL (ref 1.7–7.7)
Neutrophils Relative %: 71 %
Platelets: 214 10*3/uL (ref 150–400)
RBC: 4.14 MIL/uL (ref 3.87–5.11)
RDW: 15.8 % — ABNORMAL HIGH (ref 11.5–15.5)
WBC: 8.1 10*3/uL (ref 4.0–10.5)

## 2015-10-06 LAB — COMPREHENSIVE METABOLIC PANEL
ALBUMIN: 2.7 g/dL — AB (ref 3.5–5.0)
ALK PHOS: 59 U/L (ref 38–126)
ALT: 9 U/L — AB (ref 14–54)
ANION GAP: 6 (ref 5–15)
AST: 12 U/L — AB (ref 15–41)
BILIRUBIN TOTAL: 0.8 mg/dL (ref 0.3–1.2)
BUN: 8 mg/dL (ref 6–20)
CALCIUM: 8.6 mg/dL — AB (ref 8.9–10.3)
CO2: 34 mmol/L — AB (ref 22–32)
CREATININE: 0.97 mg/dL (ref 0.44–1.00)
Chloride: 101 mmol/L (ref 101–111)
GFR calc Af Amer: >60 mL/min (ref >60)
GFR calc non Af Amer: 55 mL/min — ABNORMAL LOW (ref >60)
GLUCOSE: 130 mg/dL — AB (ref 65–99)
Potassium: 4.2 mmol/L (ref 3.5–5.1)
SODIUM: 141 mmol/L (ref 135–145)
TOTAL PROTEIN: 5.6 g/dL — AB (ref 6.5–8.1)

## 2015-10-06 LAB — URINALYSIS, ROUTINE W REFLEX MICROSCOPIC
BILIRUBIN URINE: NEGATIVE
Glucose, UA: NEGATIVE mg/dL
Hgb urine dipstick: NEGATIVE
Ketones, ur: NEGATIVE mg/dL
NITRITE: NEGATIVE
Protein, ur: NEGATIVE mg/dL
SPECIFIC GRAVITY, URINE: 1.014 (ref 1.005–1.030)
UROBILINOGEN UA: 1 mg/dL (ref 0.0–1.0)
pH: 6 (ref 5.0–8.0)

## 2015-10-06 LAB — TROPONIN I
Troponin I: <0.03 ng/mL (ref <0.031)
Troponin I: <0.03 ng/mL (ref <0.031)

## 2015-10-06 LAB — GLUCOSE, CAPILLARY
GLUCOSE-CAPILLARY: 122 mg/dL — AB (ref 65–99)
GLUCOSE-CAPILLARY: 129 mg/dL — AB (ref 65–99)
Glucose-Capillary: 122 mg/dL — ABNORMAL HIGH (ref 65–99)
Glucose-Capillary: 173 mg/dL — ABNORMAL HIGH (ref 65–99)
Glucose-Capillary: 127 mg/dL — ABNORMAL HIGH (ref 65–99)

## 2015-10-06 LAB — MRSA PCR SCREENING: MRSA by PCR: NEGATIVE

## 2015-10-06 LAB — LACTIC ACID, PLASMA: Lactic Acid, Venous: 1.3 mmol/L (ref 0.5–2.0)

## 2015-10-06 LAB — URINE MICROSCOPIC-ADD ON

## 2015-10-06 MED ORDER — SODIUM CHLORIDE 0.9 % IV SOLN
INTRAVENOUS | Status: AC
Start: 2015-10-06 — End: 2015-10-07
  Administered 2015-10-06: 10:00:00 via INTRAVENOUS

## 2015-10-06 MED ORDER — VANCOMYCIN HCL 10 G IV SOLR
1250.0000 mg | INTRAVENOUS | Status: DC
  Administered 2015-10-06 – 2015-10-08 (×3): 1250 mg via INTRAVENOUS
  Filled 2015-10-06 (×6): qty 1250

## 2015-10-06 NOTE — Progress Notes (Signed)
Placed pt on cpap, pt tolerating well at this time.  

## 2015-10-06 NOTE — Progress Notes (Signed)
Echocardiogram 2D Echocardiogram has been performed.  Joelene Millin 10/06/2015, 11:51 AM

## 2015-10-06 NOTE — Consult Note (Signed)
WOC wound consult note Reason for Consult: intertriginous dermatitis  Wound type: partial thickness skin loss under the bilateral breast and under the pannus. She reports problem started when her health declined recently. She is a large lady.  Pressure Ulcer POA: No Wound bed: weeping, partial thickness skin loss/rash under the breast, axilla, pannus and in the bilateral groin.  Drainage (amount, consistency, odor) odor consistent with candida, thick pasty powder from the moisture Periwound: intact Dressing procedure/placement/frequency:  Will add antimicrobial wicking textile under the breast and pannus.  Need to have all the powders and cream removed thoroughly prior to use of the Interdry Ag+.  Change textile every 5 days. Discussed use with patient and with bedside nurse.  Discussed POC with patient and bedside nurse.  Re consult if needed, will not follow at this time. Thanks  Natanael Saladin Kellogg, [place] [phone])

## 2015-10-06 NOTE — Progress Notes (Signed)
Patient Demographics:    [name], is a 74 y.o. female, DOB - [birth year], AYT:[id number]  Admit date - 10/05/2015   Admitting Physician Lavina Hamman, MD  Outpatient Primary MD for the patient is Shirline Frees, MD  LOS - 1   Chief Complaint  Patient presents with  . Chest Pain        Subjective:    [name] today has, No headache, No chest pain, No abdominal pain - No Nausea, No new weakness tingling or numbness, No Cough - SOB. Feels much better   Assessment  & Plan :     1. Nonspecific chest pain. Resolved, EKG nonacute, troponin negative 2, cardiology consulted. Continue aspirin, Eliquis and monitor   2. Sepsis upon admission. Presumed source was cellulitis of the groin, on vancomycin along with antifungal powder. Afebrile, white count stable, does not appear septic. Continue empiric vancomycin. Check UA and follow urine and blood cultures.   3.Pulm Sarcoid - continue supportive care.   4. Chronic atrial fibrillation [place] Vasc score of 4 - continue Eliquis, goal will be rate controlled.   5. Obstructive sleep apnea. Wears C Pap at night. Continue.   6. Chronic diastolic dysfunction. EF in 2014 was 60%. Compensated.   7. Early dementia. Supportive care, at risk for delirium, minimize benzodiazepines and narcotics, continue home medications.    8. DM type II. Continue sliding scale insulin and monitor glycemic control.  Lab Results  Component Value Date   HGBA1C 9.6* 04/17/2014    CBG (last 3)   Recent Labs  10/05/15 2223 10/06/15 0739  GLUCAP 122* 122*      Code Status : Full  Family Communication  : None present  Disposition Plan  : SNF  Consults  : Cards  Procedures  :    DVT Prophylaxis  :  Eliquis  Lab Results  Component Value Date   PLT  250 10/05/2015    Inpatient Medications  Scheduled Meds: . apixaban  5 mg Oral BID  . aspirin EC  81 mg Oral Daily  . budesonide-formoterol  2 puff Inhalation BID  . donepezil  10 mg Oral QHS  . exemestane  25 mg Oral QPC breakfast  . insulin aspart  0-15 Units Subcutaneous TID WC  . insulin aspart  0-5 Units Subcutaneous QHS  . metoCLOPramide  5 mg Oral BID  . miconazole nitrate   Topical QID  . traZODone  100 mg Oral QHS  . vancomycin  1,250 mg Intravenous Q24H   Continuous Infusions:  PRN Meds:.acetaminophen, ondansetron (ZOFRAN) IV  Antibiotics  :     Anti-infectives    Start     Dose/Rate Route Frequency Ordered Stop   10/06/15 2200  vancomycin (VANCOCIN) 1,250 mg in sodium chloride 0.9 % 250 mL IVPB     1,250 mg 166.7 mL/hr over 90 Minutes Intravenous Every 24 hours 10/06/15 0840     10/05/15 2130  vancomycin (VANCOCIN) 2,000 mg in sodium chloride 0.9 % 500 mL IVPB     2,000 mg 250 mL/hr over 120 Minutes Intravenous  Once 10/05/15 2117 10/06/15 0201        Objective:   Filed Vitals:   10/06/15 0400 10/06/15 0700 10/06/15 0740 10/06/15 0800  BP: 109/55  104/54 104/54 98/54  Pulse: 64 60 65 56  Temp:   97.5 F (36.4 C)   TempSrc:   Oral   Resp: 18 18 15 17   Height:      Weight:      SpO2:   95%     Wt Readings from Last 3 Encounters:  10/05/15 119.5 kg (263 lb 7.2 oz)  09/15/15 119.84 kg (264 lb 3.2 oz)  03/19/15 123.016 kg (271 lb 3.2 oz)     Intake/Output Summary (Last 24 hours) at 10/06/15 0849 Last data filed at 10/05/15 1920  Gross per 24 hour  Intake    500 ml  Output      0 ml  Net    500 ml     Physical Exam  Awake Alert, Oriented X 3, No new F.N deficits, Normal affect [place]night.AT,PERRAL Supple Neck,No JVD, No cervical lymphadenopathy appriciated.  Symmetrical Chest wall movement, Good air movement bilaterally, CTAB RRR,No Gallops,Rubs or new Murmurs, No Parasternal Heave +ve B.Sounds, Abd Soft, No tenderness, No organomegaly appriciated,  No rebound - guarding or rigidity. No Cyanosis, Clubbing or edema, No new Rash or bruise     Data Review:   Micro Results Recent Results (from the past 240 hour(s))  MRSA PCR Screening     Status: None   Collection Time: 10/05/15 10:37 PM  Result Value Ref Range Status   MRSA by PCR NEGATIVE NEGATIVE Final    Comment:        The GeneXpert MRSA Assay (FDA approved for NASAL specimens only), is one component of a comprehensive MRSA colonization surveillance program. It is not intended to diagnose MRSA infection nor to guide or monitor treatment for MRSA infections.     Radiology Reports Dg Chest Portable 1 View  10/05/2015  CLINICAL DATA:  Generalized chest pain EXAM: PORTABLE CHEST 1 VIEW COMPARISON:  03/19/2015 FINDINGS: Cardiomediastinal silhouette is stable. No acute infiltrate or pleural effusion. No pulmonary edema. No pneumothorax. IMPRESSION: No active disease. Electronically Signed   By: Lahoma Crocker M.D.   On: 10/05/2015 18:24     CBC  Recent Labs Lab 10/05/15 1748 10/05/15 2120  WBC 10.8*  --   HGB 11.8*  --   HCT 37.0  --   PLT 250  --   MCV 83.5  --   MCH 26.6  --   MCHC 31.9  --   RDW 15.8*  --   LYMPHSABS  --  1.6  MONOABS  --  0.8  EOSABS  --  0.4  BASOSABS  --  0.0    Chemistries   Recent Labs Lab 10/05/15 1748 10/05/15 2120  NA 140 137  K 4.1 4.4  CL 99* 99*  CO2 32 29  GLUCOSE 159* 121*  BUN 11 11  CREATININE 1.01* 0.94  CALCIUM 8.9 8.5*  AST  --  19  ALT  --  6*  ALKPHOS  --  61  BILITOT  --  1.2   ------------------------------------------------------------------------------------------------------------------ estimated creatinine clearance is 59.2 mL/min (by C-G formula based on Cr of 0.94). ------------------------------------------------------------------------------------------------------------------ No results for input(s): HGBA1C in the last 72  hours. ------------------------------------------------------------------------------------------------------------------ No results for input(s): CHOL, HDL, LDLCALC, TRIG, CHOLHDL, LDLDIRECT in the last 72 hours. ------------------------------------------------------------------------------------------------------------------ No results for input(s): TSH, T4TOTAL, T3FREE, THYROIDAB in the last 72 hours.  Invalid input(s): FREET3 ------------------------------------------------------------------------------------------------------------------ No results for input(s): VITAMINB12, FOLATE, FERRITIN, TIBC, IRON, RETICCTPCT in the last 72 hours.  Coagulation profile  Recent Labs Lab 10/05/15 2120  INR 1.26  No results for input(s): DDIMER in the last 72 hours.  Cardiac Enzymes  Recent Labs Lab 10/05/15 2120 10/06/15 0330  TROPONINI <0.03 <0.03   ------------------------------------------------------------------------------------------------------------------ Invalid input(s): POCBNP   Time Spent in minutes   30   Mekaylah Klich K M.D on 10/06/2015 at 8:49 AM  Between 7am to 7pm - Pager - [phone]  After 7pm go to [email or web address] - password [facility]  [facility] -  Office  [phone]

## 2015-10-06 NOTE — Consult Note (Signed)
Cardiologist: Hilty Reason for Consult: Afib and chest pain Referring Physician:   [name] is an 74 y.o. female.  HPI:   Patient is a 74 year old female with chronic AF on Eliquis, COPD, chronic diastolic CHF (EF 38-25% 05/39), breast cancer, diabetes mellitus, dyslipidemia, hypertension and morbid obesity with hypoventilation syndrome, obstructive sleep apnea and sarcoid. She has not been seen in our office since April 2015.  She says that she lives by herself however H&P indicates she is from skilled nursing facility.  Her sons apparently live nearby. Her mobility is limited, she uses a walker. She has been seen multiple times in clinic with complaints of dyspnea, weight gain and LEE and has subquently required changes to her diuretic regimen.  Her diuretic was changed from Lasix to Torsemide and was advised to use PRN metolazone. She was also instructed to stop albuterol and change to Xopenex.   Patient presents with chest pain which began yesterday. She describes it as a "hurting" across R and L side of chest There is no nausea, vomiting, diaphoresis, shortness of breath, radiation of pain to the arm, neck, back or jaw.  The pain resolved spontaneously as she arrived at the hospital. She said she was not giving any medication.  She says she's been losing weight.  She ruled out for MI.  EKG is unremarkable  Arial fib  76 bpm  Low voltage   Past Medical History  Diagnosis Date  . Asthma   . Bronchitis   . Hernia   . Anemia   . Sarcoidosis ([place])   . Morbid obesity ([place])   . Sarcoid ([place])   . Dyslipidemia   . Cough   . Wheezing   . Chills   . Constipation   . Bruises easily   . Gout attack     ankle, then wrist and hands  . Hypertension   . Atrial fibrillation, persistent ([place]) 11/01/2013  . Chronic anticoagulation, on Eliquis 11/01/2013  . History of nuclear stress test 02/09/2010    dipyridamole; normal pattern of perfusion in all regions with attenuation artifact in  inferior region, low risk   . Fracture of left lower leg 06/1972    "no surgery; just casted it"  . CHF (congestive heart failure) ([place])   . Pneumonia 2012; 2014  . Chronic bronchitis ([place])   . OSA on CPAP   . Type II diabetes mellitus ([place])   . Uterine cancer [facility])     s/p hysterectomy  . Breast cancer, stage 1 ([place]) 10/17/2011    s/p right lumpectomy and XRT  . On home oxygen therapy     "2L prn" (01/24/2014)    Past Surgical History  Procedure Laterality Date  . Total knee arthroplasty Bilateral 03/2010; 06/2011    left; right  . Abdominal hysterectomy  02/04/2005  . Umbilical hernia repair  02/04/2005  . Transthoracic echocardiogram  07/2008    EF, LV size is normal; RVSP normal; mod calcif of MV apparatus  . Breast biopsy Right 01/2011  . Breast lumpectomy Right 01/2011  . Tubal ligation  1970's  . Hernia repair      Family History  Problem Relation Age of Onset  . Diabetes Mother   . Colon cancer Father   . Diabetes Brother   . Lung cancer Brother   . Diabetes Sister   . Cervical cancer Sister     Social History:  reports that she has never smoked. She has never used smokeless tobacco. She reports  that she does not drink alcohol or use illicit drugs.  Allergies:  Allergies  Allergen Reactions  . Aspirin Other (See Comments)    Avoids due to being on blood thinners  . Ibuprofen Other (See Comments)    Avoids due to being on blood thinners  . Contrast Media [Iodinated Diagnostic Agents] Nausea And Vomiting  . Pravachol Other (See Comments)    Muscle pain    Medications:  Scheduled Meds: . apixaban  5 mg Oral BID  . aspirin EC  81 mg Oral Daily  . budesonide-formoterol  2 puff Inhalation BID  . donepezil  10 mg Oral QHS  . exemestane  25 mg Oral QPC breakfast  . insulin aspart  0-15 Units Subcutaneous TID WC  . insulin aspart  0-5 Units Subcutaneous QHS  . metoCLOPramide  5 mg Oral BID  . traZODone  100 mg Oral QHS  . vancomycin  1,250 mg Intravenous Q24H    Continuous Infusions: . sodium chloride 75 mL/hr at 10/06/15 1026   PRN Meds:.acetaminophen, ondansetron (ZOFRAN) IV   Results for orders placed or performed during the hospital encounter of 10/05/15 (from the past 48 hour(s))  I-stat troponin, ED     Status: None   Collection Time: 10/05/15  5:43 PM  Result Value Ref Range   Troponin i, poc 0.00 0.00 - 0.08 ng/mL   Comment 3            Comment: Due to the release kinetics of cTnI, a negative result within the first hours of the onset of symptoms does not rule out myocardial infarction with certainty. If myocardial infarction is still suspected, repeat the test at appropriate intervals.   Basic metabolic panel     Status: Abnormal   Collection Time: 10/05/15  5:48 PM  Result Value Ref Range   Sodium 140 135 - 145 mmol/L   Potassium 4.1 3.5 - 5.1 mmol/L   Chloride 99 (L) 101 - 111 mmol/L   CO2 32 22 - 32 mmol/L   Glucose, Bld 159 (H) 65 - 99 mg/dL   BUN 11 6 - 20 mg/dL   Creatinine, Ser 1.01 (H) 0.44 - 1.00 mg/dL   Calcium 8.9 8.9 - 10.3 mg/dL   GFR calc non Af Amer 53 (L) >60 mL/min   GFR calc Af Amer >60 >60 mL/min    Comment: (NOTE) The eGFR has been calculated using the CKD EPI equation. This calculation has not been validated in all clinical situations. eGFR's persistently <60 mL/min signify possible Chronic Kidney Disease.    Anion gap 9 5 - 15  CBC     Status: Abnormal   Collection Time: 10/05/15  5:48 PM  Result Value Ref Range   WBC 10.8 (H) 4.0 - 10.5 K/uL   RBC 4.43 3.87 - 5.11 MIL/uL   Hemoglobin 11.8 (L) 12.0 - 15.0 g/dL   HCT 37.0 36.0 - 46.0 %   MCV 83.5 78.0 - 100.0 fL   MCH 26.6 26.0 - 34.0 pg   MCHC 31.9 30.0 - 36.0 g/dL   RDW 15.8 (H) 11.5 - 15.5 %   Platelets 250 150 - 400 K/uL  I-Stat CG4 Lactic Acid, ED     Status: None   Collection Time: 10/05/15  5:50 PM  Result Value Ref Range   Lactic Acid, Venous 1.64 0.5 - 2.0 mmol/L  Comprehensive metabolic panel     Status: Abnormal   Collection  Time: 10/05/15  9:20 PM  Result Value Ref Range  Sodium 137 135 - 145 mmol/L   Potassium 4.4 3.5 - 5.1 mmol/L   Chloride 99 (L) 101 - 111 mmol/L   CO2 29 22 - 32 mmol/L   Glucose, Bld 121 (H) 65 - 99 mg/dL   BUN 11 6 - 20 mg/dL   Creatinine, Ser 0.94 0.44 - 1.00 mg/dL   Calcium 8.5 (L) 8.9 - 10.3 mg/dL   Total Protein 6.0 (L) 6.5 - 8.1 g/dL   Albumin 3.0 (L) 3.5 - 5.0 g/dL   AST 19 15 - 41 U/L   ALT 6 (L) 14 - 54 U/L   Alkaline Phosphatase 61 38 - 126 U/L   Total Bilirubin 1.2 0.3 - 1.2 mg/dL   GFR calc non Af Amer 57 (L) >60 mL/min   GFR calc Af Amer >60 >60 mL/min    Comment: (NOTE) The eGFR has been calculated using the CKD EPI equation. This calculation has not been validated in all clinical situations. eGFR's persistently <60 mL/min signify possible Chronic Kidney Disease.    Anion gap 9 5 - 15  Procalcitonin     Status: None   Collection Time: 10/05/15  9:20 PM  Result Value Ref Range   Procalcitonin <0.10 ng/mL    Comment:        Interpretation: PCT (Procalcitonin) <= 0.5 ng/mL: Systemic infection (sepsis) is not likely. Local bacterial infection is possible. (NOTE)         ICU PCT Algorithm               Non ICU PCT Algorithm    ----------------------------     ------------------------------         PCT < 0.25 ng/mL                 PCT < 0.1 ng/mL     Stopping of antibiotics            Stopping of antibiotics       strongly encouraged.               strongly encouraged.    ----------------------------     ------------------------------       PCT level decrease by               PCT < 0.25 ng/mL       >= 80% from peak PCT       OR PCT 0.25 - 0.5 ng/mL          Stopping of antibiotics                                             encouraged.     Stopping of antibiotics           encouraged.    ----------------------------     ------------------------------       PCT level decrease by              PCT >= 0.25 ng/mL       < 80% from peak PCT        AND PCT >= 0.5  ng/mL            Continuin g antibiotics  encouraged.       Continuing antibiotics            encouraged.    ----------------------------     ------------------------------     PCT level increase compared          PCT > 0.5 ng/mL         with peak PCT AND          PCT >= 0.5 ng/mL             Escalation of antibiotics                                          strongly encouraged.      Escalation of antibiotics        strongly encouraged.   Protime-INR     Status: Abnormal   Collection Time: 10/05/15  9:20 PM  Result Value Ref Range   Prothrombin Time 16.0 (H) 11.6 - 15.2 seconds   INR 1.26 0.00 - 1.49  APTT     Status: Abnormal   Collection Time: 10/05/15  9:20 PM  Result Value Ref Range   aPTT 40 (H) 24 - 37 seconds    Comment:        IF BASELINE aPTT IS ELEVATED, SUGGEST PATIENT RISK ASSESSMENT BE USED TO DETERMINE APPROPRIATE ANTICOAGULANT THERAPY.   Troponin I     Status: None   Collection Time: 10/05/15  9:20 PM  Result Value Ref Range   Troponin I <0.03 <0.031 ng/mL    Comment:        NO INDICATION OF MYOCARDIAL INJURY.   Differential     Status: None   Collection Time: 10/05/15  9:20 PM  Result Value Ref Range   Neutrophils Relative % 74 %   Neutro Abs 7.6 1.7 - 7.7 K/uL   Lymphocytes Relative 15 %   Lymphs Abs 1.6 0.7 - 4.0 K/uL   Monocytes Relative 7 %   Monocytes Absolute 0.8 0.1 - 1.0 K/uL   Eosinophils Relative 4 %   Eosinophils Absolute 0.4 0.0 - 0.7 K/uL   Basophils Relative 0 %   Basophils Absolute 0.0 0.0 - 0.1 K/uL  Glucose, capillary     Status: Abnormal   Collection Time: 10/05/15 10:23 PM  Result Value Ref Range   Glucose-Capillary 122 (H) 65 - 99 mg/dL  MRSA PCR Screening     Status: None   Collection Time: 10/05/15 10:37 PM  Result Value Ref Range   MRSA by PCR NEGATIVE NEGATIVE    Comment:        The GeneXpert MRSA Assay (FDA approved for NASAL specimens only), is one component of  a comprehensive MRSA colonization surveillance program. It is not intended to diagnose MRSA infection nor to guide or monitor treatment for MRSA infections.   Lactic acid, plasma     Status: None   Collection Time: 10/05/15 11:20 PM  Result Value Ref Range   Lactic Acid, Venous 1.3 0.5 - 2.0 mmol/L  Troponin I     Status: None   Collection Time: 10/06/15  3:30 AM  Result Value Ref Range   Troponin I <0.03 <0.031 ng/mL    Comment:        NO INDICATION OF MYOCARDIAL INJURY.   Glucose, capillary     Status: Abnormal   Collection Time: 10/06/15  7:39 AM  Result Value Ref Range  Glucose-Capillary 122 (H) 65 - 99 mg/dL    Dg Chest Portable 1 View  10/05/2015  CLINICAL DATA:  Generalized chest pain EXAM: PORTABLE CHEST 1 VIEW COMPARISON:  03/19/2015 FINDINGS: Cardiomediastinal silhouette is stable. No acute infiltrate or pleural effusion. No pulmonary edema. No pneumothorax. IMPRESSION: No active disease. Electronically Signed   By: Lahoma Crocker M.D.   On: 10/05/2015 18:24    Review of Systems  Constitutional: Negative for fever and diaphoresis.  HENT: Negative for congestion.   Respiratory: Negative for cough and shortness of breath.   Cardiovascular: Positive for chest pain and orthopnea. Negative for leg swelling and PND.  Gastrointestinal: Negative for nausea, vomiting, abdominal pain, blood in stool and melena.  Genitourinary: Negative for hematuria.  Musculoskeletal: Negative for myalgias.  Neurological: Negative for dizziness and weakness.  All other systems reviewed and are negative.  Blood pressure 98/54, pulse 56, temperature 97.5 F (36.4 C), temperature source Oral, resp. rate 17, height 4' 11" (1.499 m), weight 263 lb 7.2 oz (119.5 kg), SpO2 98 %. Physical Exam  Nursing note and vitals reviewed. Constitutional: She is oriented to person, place, and time. She appears well-developed. No distress.  Obese  HENT:  Head: Normocephalic and atraumatic.  Eyes: EOM are  normal. Pupils are equal, round, and reactive to light. No scleral icterus.  Neck: Normal range of motion. Neck supple.  Cardiovascular: Normal rate, S1 normal and S2 normal.  An irregularly irregular rhythm present.  No murmur heard. Pulses:      Radial pulses are 2+ on the right side, and 2+ on the left side.       Dorsalis pedis pulses are 2+ on the right side, and 2+ on the left side.  Respiratory: Effort normal. She has wheezes. She has no rales.  GI: Soft. Bowel sounds are normal. She exhibits no distension. There is no tenderness.  Musculoskeletal: She exhibits no edema.  No lower extremity edema  Lymphadenopathy:    She has no cervical adenopathy.  Neurological: She is alert and oriented to person, place, and time. She exhibits normal muscle tone.  Skin: Skin is warm and dry.  Psychiatric: She has a normal mood and affect.    Assessment/Plan: Principal Problem:   Chest pain Active Problems:   PULMONARY SARCOIDOSIS   Obesity hypoventilation syndrome (HCC)   Breast cancer of lower-inner quadrant of right female breast (HCC)   Hyperlipidemia   Chronic respiratory failure with hypoxia (HCC)   Atrial fibrillation, persistent (HCC)   Chronic anticoagulation, on Eliquis   Chronic diastolic CHF (congestive heart failure) (HCC)   Diabetes mellitus, type II (HCC)   Hypotension   Dementia   Rash of groin  74 year old morbidly obese female with chronic AF on Eliquis, COPD, chronic diastolic CHF (EF 35-57% 32/20), breast cancer, diabetes mellitus, dyslipidemia, hypertension and morbid obesity with hypoventilation syndrome, obstructive sleep apnea and sarcoid.  CHADSVASC 6.  On Eliquis. She presented with a "hurting" across chest  which resolved spontaneously.  Angina related to hypotension in setting of pulm HTN  Troponin is negative thus far.  Her EKG shows chronic A. Fib with no acute ischemic changes.  A 2-D echocardiogram is in process right now   If rel unchanged would follow  clinically  . She is mildly hypotensive.  She is normally on Cardizem 240 mg daily as well as 80 mg of Lasix twice a day and Cozaar 50 mg.  She is on none of these at the moment.   She currently appears  euvolemic. Chest x-ray shows no active disease.  She had sepsis upon admission which was thought to be from cellulitis of the groin. She was started on vancomycin and antifungal powder.  Sharon Fuller, PA-C 10/06/2015, 10:37 AM   Patient seen and examined  I agree with findings of [name]  I have amended note above Echo pending.   Continue rate control and Eliquis  Watch BP

## 2015-10-06 NOTE — Clinical Documentation Improvement (Signed)
Hospitalist  (please document your query response in the progress notes and discharge summary, not on the query form itself.)  Patient's presenting complaint was Chest Pain.  To help with accurate code assignment and to accurately reflect the patient's SOI/ROM, please document the Probable, Likely, Suspected or Known cause of the patient's Chest Pain.   Please exercise your independent, professional judgment when responding. A specific answer is not anticipated or expected.   Thank You,  Erling Conte  RN BSN CCDS [phone] Health Information Management [place]

## 2015-10-07 DIAGNOSIS — Z7901 Long term (current) use of anticoagulants: Secondary | ICD-10-CM

## 2015-10-07 DIAGNOSIS — L8990 Pressure ulcer of unspecified site, unspecified stage: Secondary | ICD-10-CM | POA: Insufficient documentation

## 2015-10-07 DIAGNOSIS — I5032 Chronic diastolic (congestive) heart failure: Secondary | ICD-10-CM

## 2015-10-07 DIAGNOSIS — C50311 Malignant neoplasm of lower-inner quadrant of right female breast: Secondary | ICD-10-CM

## 2015-10-07 LAB — GLUCOSE, CAPILLARY
GLUCOSE-CAPILLARY: 167 mg/dL — AB (ref 65–99)
Glucose-Capillary: 145 mg/dL — ABNORMAL HIGH (ref 65–99)
Glucose-Capillary: 167 mg/dL — ABNORMAL HIGH (ref 65–99)
Glucose-Capillary: 219 mg/dL — ABNORMAL HIGH (ref 65–99)
Glucose-Capillary: 157 mg/dL — ABNORMAL HIGH (ref 65–99)

## 2015-10-07 LAB — HEMOGLOBIN A1C
Hgb A1c MFr Bld: 5.9 % — ABNORMAL HIGH (ref 4.8–5.6)
Mean Plasma Glucose: 123 mg/dL

## 2015-10-07 MED ORDER — DEXTROSE 5 % IV SOLN
1.0000 g | INTRAVENOUS | Status: DC
  Administered 2015-10-07 – 2015-10-09 (×3): 1 g via INTRAVENOUS
  Filled 2015-10-07 (×3): qty 10

## 2015-10-07 NOTE — Progress Notes (Signed)
Patient seen for evaluation by physical therapy. During mobility (standing and transfer OOB to chair) patient with elevated HR upper 160s afib and increased SOB. This happened each time we attempted mobility with patient. Nursing Jana Half made aware. Full written evaluation assessment to follow.  Alben Deeds, [place] DPT  [phone]

## 2015-10-07 NOTE — Evaluation (Signed)
Physical Therapy Evaluation Patient Details Name: [name] MRN: [id number] DOB: [birth year] Today's Date: 10/07/2015   History of Present Illness  Patient is a 74 year old female with chronic AF on Eliquis, COPD, chronic diastolic CHF (EF 06-30% 16/01), breast cancer, diabetes mellitus, dyslipidemia, hypertension and morbid obesity with hypoventilation syndrome, obstructive sleep apnea and sarcoid.  She presented to the ED with chest pain and SOB.  Clinical Impression  Pt presents with impairments as indicated below.  Of note: Pt with HR increasing into the 160s today with activity.  Pt will benefit from continued skilled acute PT services to maximize safety and independence with functional mobility and to increase activity tolerance.  PT recommends SNF upon discharge.    Follow Up Recommendations SNF;Supervision for mobility/OOB    Equipment Recommendations  None recommended by PT    Recommendations for Other Services OT consult     Precautions / Restrictions Precautions Precautions: Fall Precaution Comments: monitor HR Restrictions Weight Bearing Restrictions: No      Mobility  Bed Mobility Overal bed mobility: Needs Assistance Bed Mobility: Supine to Sit     Supine to sit: Mod assist (for trunk elevation and to scoot to EOB)        Transfers Overall transfer level: Needs assistance Equipment used: Rolling walker (2 wheeled) Transfers: Sit to/from [facility] Sit to Stand: Min assist;+2 physical assistance Stand pivot transfers: Min assist;+2 physical assistance       General transfer comment: VCs for hand/foot placement and to scoot to EOB prior to standing.  Pt HR increasing into the 160s with activity.  Ambulation/Gait                Stairs            Wheelchair Mobility    Modified Rankin (Stroke Patients Only)       Balance Overall balance assessment: Needs assistance   Sitting balance-Leahy Scale: Fair      Standing balance support: Bilateral upper extremity supported Standing balance-Leahy Scale: Poor                               Pertinent Vitals/Pain Pain Assessment: No/denies pain    Home Living Family/patient expects to be discharged to:: Skilled nursing facility                      Prior Function Level of Independence: Needs assistance   Gait / Transfers Assistance Needed: pt states she is able to ambulate with RW  ADL's / Homemaking Assistance Needed: pt states she has nurses who help her bathe and take care of other ADLs        Hand Dominance        Extremity/Trunk Assessment   Upper Extremity Assessment: Defer to OT evaluation           Lower Extremity Assessment: Overall WFL for tasks assessed         Communication   Communication: No difficulties  Cognition Arousal/Alertness: Awake/alert Behavior During Therapy: WFL for tasks assessed/performed Overall Cognitive Status: No family/caregiver present to determine baseline cognitive functioning                      General Comments General comments (skin integrity, edema, etc.): Pt on 2L [place] with O2 sats stable throughout session.  Pt HR increased into the 160s with activity and pt noting increased dyspnea, stating she was at a "  7 or 8 out of 10".  Nurse has been notified.  Pt educated on general LE exercises and pursed lip breathing technique.    Exercises        Assessment/Plan    PT Assessment Patient needs continued PT services  PT Diagnosis Difficulty walking;Other (comment) (decreased activity tolerance)   PT Problem List Decreased strength;Decreased activity tolerance;Decreased balance;Decreased mobility;Decreased knowledge of use of DME;Decreased safety awareness;Cardiopulmonary status limiting activity  PT Treatment Interventions DME instruction;Gait training;Functional mobility training;Therapeutic activities;Therapeutic exercise;Balance training;Patient/family  education   PT Goals (Current goals can be found in the Care Plan section) Acute Rehab PT Goals Patient Stated Goal: none stated PT Goal Formulation: With patient Time For Goal Achievement: 10/21/15 Potential to Achieve Goals: Fair    Frequency Min 2X/week   Barriers to discharge        Co-evaluation               End of Session Equipment Utilized During Treatment: Gait belt;Oxygen (2L [place]) Activity Tolerance: Treatment limited secondary to medical complications (Comment) (HR into the 160s with activity) Patient left: in chair;with call bell/phone within reach;with chair alarm set Nurse Communication: Mobility status;Other (comment) (HR into the 160s)         Time: 1415-1440 PT Time Calculation (min) (ACUTE ONLY): 25 min   Charges:   PT Evaluation $Initial PT Evaluation Tier I: 1 Procedure PT Treatments $Therapeutic Activity: 8-22 mins   PT G Codes:        [name] Oct 21, 2015, 3:58 PM [name], [name]

## 2015-10-07 NOTE — Clinical Social Work Placement (Signed)
   CLINICAL SOCIAL WORK PLACEMENT  NOTE  Date:  10/07/2015  Patient Details  Name: [name] MRN: [id number] Date of Birth: 1941-07-01  Clinical Social Work is seeking post-discharge placement for this patient at the [place] level of care (*CSW will initial, date and re-position this form in  chart as items are completed):  Yes   Patient/family provided with [place] Work Department's list of facilities offering this level of care within the geographic area requested by the patient (or if unable, by the patient's family).  Yes   Patient/family informed of their freedom to choose among providers that offer the needed level of care, that participate in Medicare, Medicaid or managed care program needed by the patient, have an available bed and are willing to accept the patient.  Yes   Patient/family informed of [facility]'s ownership interest in [facility] and [facility], as well as of the fact that they are under no obligation to receive care at these facilities.  PASRR submitted to EDS on       PASRR number received on       Existing PASRR number confirmed on       FL2 transmitted to all facilities in geographic area requested by pt/family on       FL2 transmitted to all facilities within larger geographic area on       Patient informed that his/her managed care company has contracts with or will negotiate with certain facilities, including the following:            Patient/family informed of bed offers received.  Patient chooses bed at       Physician recommends and patient chooses bed at      Patient to be transferred to   on  .  Patient to be transferred to facility by       Patient family notified on   of transfer.  Name of family member notified:        PHYSICIAN Please sign FL2     Additional Comment:    _______________________________________________ Cranford Mon, LCSW 10/07/2015, 5:09 PM

## 2015-10-07 NOTE — Progress Notes (Signed)
[facility]                                                                              Patient Demographics  [name], is a 74 y.o. female, DOB - January 21, 1941, QTM:[id number]  Admit date - 10/05/2015   Admitting Physician Lavina Hamman, MD  Outpatient Primary MD for the patient is Shirline Frees, MD  LOS - 2   Chief Complaint  Patient presents with  . Chest Pain       Brief HPI  Patient is a 74 year old female with sarcoidosis, pulmonary hypertension, chronic diastolic CHF, essential hypertension, chronic atrial ablation on antifungal medication, dyslipidemia, OSA on CPAP, diabetes presented with chest pain. The patient reported that she was at her baseline but around 2 PM on the day of admission she started having burning chest pain across her chest and shortness of breath. Patient was sent from skilled nursing facility. In the ER, patient was chest pain-free.    Assessment & Plan   Primary problem Atypical chest pain  -  Resolved, EKG nonacute, troponin negative 3,  - cardiology consulted. Continue aspirin, Eliquis  - 2-D echo showed EF of 60-65% with normal wall motion, no regional wall motion abnormalities, moderate tricuspid regurgitation.  Active problems Sepsis upon admission. Presumed source was cellulitis of the groin, UTI - Continue vancomycin along with antifungal powder.  - Blood cultures negative so far, and Rocephin, follow urine culture and sensitivities, urine culture sent today   pulmonary Sarcoid - continue supportive care.   Chronic atrial fibrillation  - [place] Vasc score of 4 - continue Eliquis - Currently Rate controlled, add Cardizem if BP allows   Obstructive sleep apnea. - Continue CPAP at night     Chronic diastolic dysfunction/CHF.  - Currently compensated, 2-D echo showed EF of 60-65%.    Early dementia. Supportive care, at risk for delirium, minimize benzodiazepines and narcotics, continue home medications.      DM type II.  - Continue sliding scale insulin and monitor glycemic control. - CBGs controlled   Code Status: Full CODE STATUS  Family Communication: Discussed in detail with the patient, all imaging results, lab results explained to the patient    Disposition Plan: Transfer to floor, hopefully DC in a.m., PTOT evaluation  Time Spent in minutes  25 minutes  Procedures  echo  Consults   cardiology  DVT Prophylaxis apixaban  Medications  Scheduled Meds: . apixaban  5 mg Oral BID  . aspirin EC  81 mg Oral Daily  . budesonide-formoterol  2 puff Inhalation BID  . donepezil  10 mg Oral QHS  . exemestane  25 mg Oral QPC breakfast  . insulin aspart  0-15 Units Subcutaneous TID WC  . insulin aspart  0-5 Units Subcutaneous QHS  . metoCLOPramide  5 mg Oral BID  . traZODone  100 mg Oral QHS  . vancomycin  1,250 mg Intravenous Q24H   Continuous Infusions:  PRN Meds:.acetaminophen, ondansetron (ZOFRAN) IV   Antibiotics   Anti-infectives    Start     Dose/Rate Route Frequency Ordered Stop   10/06/15 2200  vancomycin (VANCOCIN) 1,250 mg in sodium  chloride 0.9 % 250 mL IVPB     1,250 mg 166.7 mL/hr over 90 Minutes Intravenous Every 24 hours 10/06/15 0840     10/05/15 2130  vancomycin (VANCOCIN) 2,000 mg in sodium chloride 0.9 % 500 mL IVPB     2,000 mg 250 mL/hr over 120 Minutes Intravenous  Once 10/05/15 2117 10/06/15 0201        Subjective:   [name] was seen and examined today. Denies any chest pain, shortness of breath grade afebrile. Patient denies dizziness, abdominal pain, N/V/D/C, new weakness, numbess, tingling. No acute events overnight.    Objective:   Blood pressure 129/65, pulse 98, temperature 98.2 F (36.8 C), temperature source Oral, resp. rate 19, height 4\' 11"  (1.499 m), weight 119.5 kg (263 lb 7.2 oz), SpO2 96 %.  Wt Readings from Last 3 Encounters:  10/05/15 119.5 kg (263 lb 7.2 oz)  09/15/15 119.84 kg (264 lb 3.2 oz)  03/19/15 123.016  kg (271 lb 3.2 oz)     Intake/Output Summary (Last 24 hours) at 10/07/15 1033 Last data filed at 10/07/15 0700  Gross per 24 hour  Intake 1882.5 ml  Output      0 ml  Net 1882.5 ml    Exam  General: Alert and oriented x 3, NAD  HEENT:  PERRLA, EOMI, Anicteric Sclera, mucous membranes moist.   Neck: Supple, no JVD, no masses  CVS: S1 S2 auscultated, no rubs, murmurs or gallops. Regular rate and rhythm.  Respiratory: Clear to auscultation bilaterally, no wheezing, rales or rhonchi  Abdomen: Soft, nontender, nondistended, + bowel sounds  Ext: no cyanosis clubbing or edema  Neuro: AAOx3, Cr N's II- XII. Strength 5/5 upper and lower extremities bilaterally  Skin: No rashes  Psych: Normal affect and demeanor, alert and oriented x3    Data Review   Micro Results Recent Results (from the past 240 hour(s))  Culture, blood (x 2)     Status: None (Preliminary result)   Collection Time: 10/05/15  9:20 PM  Result Value Ref Range Status   Specimen Description BLOOD RIGHT HAND  Final   Special Requests BOTTLES DRAWN AEROBIC AND ANAEROBIC 5CC   Final   Culture NO GROWTH < 24 HOURS  Final   Report Status PENDING  Incomplete  Culture, blood (x 2)     Status: None (Preliminary result)   Collection Time: 10/05/15  9:26 PM  Result Value Ref Range Status   Specimen Description BLOOD LEFT HAND  Final   Special Requests BOTTLES DRAWN AEROBIC AND ANAEROBIC 4CC  Final   Culture NO GROWTH < 24 HOURS  Final   Report Status PENDING  Incomplete  MRSA PCR Screening     Status: None   Collection Time: 10/05/15 10:37 PM  Result Value Ref Range Status   MRSA by PCR NEGATIVE NEGATIVE Final    Comment:        The GeneXpert MRSA Assay (FDA approved for NASAL specimens only), is one component of a comprehensive MRSA colonization surveillance program. It is not intended to diagnose MRSA infection nor to guide or monitor treatment for MRSA infections.     Radiology Reports Dg Chest  Portable 1 View  10/05/2015  CLINICAL DATA:  Generalized chest pain EXAM: PORTABLE CHEST 1 VIEW COMPARISON:  03/19/2015 FINDINGS: Cardiomediastinal silhouette is stable. No acute infiltrate or pleural effusion. No pulmonary edema. No pneumothorax. IMPRESSION: No active disease. Electronically Signed   By: Lahoma Crocker M.D.   On: 10/05/2015 18:24    CBC  Recent Labs Lab 10/05/15 1748 10/05/15 2120 10/06/15 1025  WBC 10.8*  --  8.1  HGB 11.8*  --  11.1*  HCT 37.0  --  34.8*  PLT 250  --  214  MCV 83.5  --  84.1  MCH 26.6  --  26.8  MCHC 31.9  --  31.9  RDW 15.8*  --  15.8*  LYMPHSABS  --  1.6 1.5  MONOABS  --  0.8 0.5  EOSABS  --  0.4 0.3  BASOSABS  --  0.0 0.0    Chemistries   Recent Labs Lab 10/05/15 1748 10/05/15 2120 10/06/15 1025  NA 140 137 141  K 4.1 4.4 4.2  CL 99* 99* 101  CO2 32 29 34*  GLUCOSE 159* 121* 130*  BUN 11 11 8   CREATININE 1.01* 0.94 0.97  CALCIUM 8.9 8.5* 8.6*  AST  --  19 12*  ALT  --  6* 9*  ALKPHOS  --  61 59  BILITOT  --  1.2 0.8   ------------------------------------------------------------------------------------------------------------------ estimated creatinine clearance is 57.4 mL/min (by C-G formula based on Cr of 0.97). ------------------------------------------------------------------------------------------------------------------  Recent Labs  10/05/15 2120  HGBA1C 5.9*   ------------------------------------------------------------------------------------------------------------------ No results for input(s): CHOL, HDL, LDLCALC, TRIG, CHOLHDL, LDLDIRECT in the last 72 hours. ------------------------------------------------------------------------------------------------------------------ No results for input(s): TSH, T4TOTAL, T3FREE, THYROIDAB in the last 72 hours.  Invalid input(s): FREET3 ------------------------------------------------------------------------------------------------------------------ No results for  input(s): VITAMINB12, FOLATE, FERRITIN, TIBC, IRON, RETICCTPCT in the last 72 hours.  Coagulation profile  Recent Labs Lab 10/05/15 2120 10/06/15 1025  INR 1.26 1.39    No results for input(s): DDIMER in the last 72 hours.  Cardiac Enzymes  Recent Labs Lab 10/05/15 2120 10/06/15 0330 10/06/15 1025  TROPONINI <0.03 <0.03 <0.03   ------------------------------------------------------------------------------------------------------------------ Invalid input(s): POCBNP   Recent Labs  10/05/15 2223 10/06/15 0739 10/06/15 1210 10/06/15 1811 10/06/15 2248 10/07/15 0832  GLUCAP 122* 122* 129* 173* [place]     Brit Wernette M.D. [facility] 10/07/2015, 10:33 AM  Pager: [phone] Between 7am to 7pm - call Pager - [phone]  After 7pm go to [email or web address] - password [email or web address]  Call night coverage person covering after 7pm

## 2015-10-07 NOTE — NC FL2 (Signed)
[place] LEVEL OF CARE SCREENING TOOL     IDENTIFICATION  Patient Name: [name] Birthdate: 15-Jun-1941 Sex: female Admission Date (Current Location): 10/05/2015  [facility] and [place] Number: Herbalist and Address:  [facility] [place], [place] [address], [place], [place] [address]      Provider Number: [id number]  Attending Physician Name and Address:  Mendel Corning, MD  Relative Name and Phone Number:  [name]    Current Level of Care: Hospital Recommended Level of Care: [place] Prior Approval Number:    Date Approved/Denied:   PASRR Number:    Discharge Plan: SNF    Current Diagnoses: Patient Active Problem List   Diagnosis Date Noted  . Pressure ulcer 10/07/2015  . Chest pain 10/06/2015  . Hypotension 10/05/2015  . Dementia 10/05/2015  . Rash of groin 10/05/2015  . Osteoporosis 10/02/2014  . UTI (urinary tract infection) 04/21/2014  . Vertigo 04/19/2014  . Hyponatremia 04/18/2014  . Hyperkalemia 04/18/2014  . Dehydration 04/18/2014  . Hyperglycemia 04/17/2014  . Pre-syncope 04/17/2014  . Dyspnea on exertion 04/15/2014  . Pulmonary hypertension- PA pressure 46 mmHg 01/29/2014  . Hypokalemia 01/27/2014  . Acute on chronic diastolic heart failure ([place]) 01/24/2014  . Diabetes mellitus, type II ([place]) 01/14/2014  . Hypertension 11/27/2013  . Constipation 11/27/2013  . Atrial fibrillation ([place]) 11/27/2013  . Chronic diastolic CHF (congestive heart failure) ([place]) 11/18/2013  . Bradycardia 11/01/2013  . Atrial fibrillation, persistent ([place]) 11/01/2013  . Chronic anticoagulation, on Eliquis 11/01/2013  . Fungal dermatitis 10/15/2013  . COPD (chronic obstructive pulmonary disease) ([place]) 08/19/2013  . Chronic respiratory failure with hypoxia ([place]) 08/19/2013  . Weakness generalized 08/19/2013  . Cellulitis of breast right 08/19/2013  . Lung nodule seen on imaging study 08/18/2013  . Nausea and  vomiting 08/18/2013  . Lower GI bleed 05/18/2013  . Aspiration pneumonia ([place]) 05/12/2013  . Hyperlipidemia 05/03/2013  . COPD exacerbation ([place]) 11/20/2012  . Gout 11/17/2012  . Hypocalcemia 11/17/2012  . Acute renal failure ([place]) 11/17/2012  . Upper respiratory infection 12/17/2011  . Breast cancer of lower-inner quadrant of right female breast ([place]) 10/17/2011  . Obesity hypoventilation syndrome ([place]) 09/24/2011  . Peripheral edema 09/24/2011  . Anemia 06/01/2011  . 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  . Bronchitis 05/27/2008  . Obstructive sleep apnea 05/27/2008    Orientation ACTIVITIES/SOCIAL BLADDER RESPIRATION   (dementia- fluctuating orientation)   Incontinent O2 (As needed)  BEHAVIORAL SYMPTOMS/MOOD NEUROLOGICAL BOWEL NUTRITION STATUS   (no behavioral issues noted)   Continent Diet (cardiac)  PHYSICIAN VISITS COMMUNICATION OF NEEDS Height & Weight Skin    Verbally 4\' 11"  (149.9 cm) 263 lbs. PU Stage and Appropriate Care          AMBULATORY STATUS RESPIRATION    Assist extensive O2 (As needed)      Personal Care Assistance Level of Assistance  Bathing, Dressing Bathing Assistance: Limited assistance   Dressing Assistance: Limited assistance      Functional Limitations Info                SPECIAL CARE FACTORS FREQUENCY  PT (By licensed PT)     PT Frequency: 5/wk             Additional Factors Info  Allergies               Current Medications (10/07/2015): Current Facility-Administered Medications  Medication Dose Route Frequency Provider Last Rate Last Dose  . acetaminophen (TYLENOL) tablet  650 mg  650 mg Oral Q4H PRN Lavina Hamman, MD      . apixaban Arne Cleveland) tablet 5 mg  5 mg Oral BID Otilio Miu, RPH   5 mg at 10/07/15 0911  . aspirin EC tablet 81 mg  81 mg Oral Daily Lavina Hamman, MD   81 mg at 10/07/15 0911  . budesonide-formoterol (SYMBICORT) 160-4.5 MCG/ACT inhaler 2 puff  2 puff Inhalation BID Lavina Hamman, MD   2 puff  at 10/07/15 0749  . cefTRIAXone (ROCEPHIN) 1 g in dextrose 5 % 50 mL IVPB  1 g Intravenous Q24H Ripudeep K Rai, MD   1 g at 10/07/15 1156  . donepezil (ARICEPT) tablet 10 mg  10 mg Oral QHS Lavina Hamman, MD   10 mg at 10/06/15 2238  . exemestane (AROMASIN) tablet 25 mg  25 mg Oral QPC breakfast Lavina Hamman, MD   25 mg at 10/07/15 0912  . insulin aspart (novoLOG) injection 0-15 Units  0-15 Units Subcutaneous TID WC Lavina Hamman, MD   3 Units at 10/07/15 1251  . insulin aspart (novoLOG) injection 0-5 Units  0-5 Units Subcutaneous QHS Lavina Hamman, MD   0 Units at 10/05/15 2250  . metoCLOPramide (REGLAN) tablet 5 mg  5 mg Oral BID Lavina Hamman, MD   5 mg at 10/07/15 0911  . ondansetron (ZOFRAN) injection 4 mg  4 mg Intravenous Q6H PRN Lavina Hamman, MD      . traZODone (DESYREL) tablet 100 mg  100 mg Oral QHS Lavina Hamman, MD   100 mg at 10/06/15 2238  . vancomycin (VANCOCIN) 1,250 mg in sodium chloride 0.9 % 250 mL IVPB  1,250 mg Intravenous Q24H Eudelia Bunch, RPH   1,250 mg at 10/06/15 2237   Do not use this list as official medication orders. Please verify with discharge summary.  Discharge Medications:   Medication List    ASK your doctor about these medications        acetaminophen 500 MG tablet  Commonly known as:  TYLENOL  Take 1,000 mg by mouth every 6 (six) hours as needed. pain     albuterol 108 (90 BASE) MCG/ACT inhaler  Commonly known as:  PROVENTIL HFA;VENTOLIN HFA  Inhale 2 puffs into the lungs 4 (four) times daily as needed for wheezing or shortness of breath.     apixaban 5 MG Tabs tablet  Commonly known as:  ELIQUIS  TAKE 1 TABLET (5 MG TOTAL) BY MOUTH 2 (TWO) TIMES DAILY.     colchicine 0.6 MG tablet  Take 1 tablet (0.6 mg total) by mouth 2 (two) times daily.     diltiazem 240 MG 24 hr capsule  Commonly known as:  CARDIZEM CD  Take 240 mg by mouth every morning.     donepezil 10 MG tablet  Commonly known as:  ARICEPT  Take 10 mg by mouth at  bedtime.     exemestane 25 MG tablet  Commonly known as:  AROMASIN  Take 25 mg by mouth daily after breakfast.     furosemide 80 MG tablet  Commonly known as:  LASIX  Take 80 mg by mouth 2 (two) times daily.     glipiZIDE 10 MG tablet  Commonly known as:  GLUCOTROL  Take 10 mg by mouth daily before breakfast.     ketoconazole 2 % cream  Commonly known as:  NIZORAL  Apply 1 application topically daily as needed for irritation.  losartan 50 MG tablet  Commonly known as:  COZAAR  Take 50 mg by mouth daily.     metFORMIN 500 MG tablet  Commonly known as:  GLUCOPHAGE  Take 500 mg by mouth 2 (two) times daily with a meal.     metoCLOPramide 5 MG tablet  Commonly known as:  REGLAN  Take 5 mg by mouth 2 (two) times daily.     nystatin cream  Commonly known as:  MYCOSTATIN  Apply 1 application topically 2 (two) times daily.     ondansetron 8 MG tablet  Commonly known as:  ZOFRAN  Take 8 mg by mouth every 8 (eight) hours as needed for nausea or vomiting.     OVER THE COUNTER MEDICATION  Inhale 1 application into the lungs at bedtime. CPAP     OYSTER-CAL 500 + D PO  Take 1 tablet by mouth 2 (two) times daily.     potassium chloride SA 20 MEQ tablet  Commonly known as:  K-DUR,KLOR-CON  Take 40 mEq by mouth daily.     STOOL SOFTENER PO  Take 1 capsule by mouth daily as needed (FOR CONSTIPATION).     SYMBICORT 160-4.5 MCG/ACT inhaler  Generic drug:  budesonide-formoterol  Inhale 2 puffs into the lungs 2 (two) times daily. Rinse mouth well after use     traZODone 100 MG tablet  Commonly known as:  DESYREL  Take 100 mg by mouth at bedtime.     vitamin B-12 1000 MCG tablet  Commonly known as:  CYANOCOBALAMIN  Take 1,000 mcg by mouth daily.        Relevant Imaging Results:  Relevant Lab Results:  Recent Labs    Additional Information    Cranford Mon, LCSW

## 2015-10-07 NOTE — Progress Notes (Signed)
TELEMETRY: Reviewed telemetry pt in Atrial fibrillation with rate 80s-90s: Filed Vitals:   10/07/15 0334 10/07/15 0740 10/07/15 0750 10/07/15 0833  BP: 116/62 129/65 129/65   Pulse: 75 82 98   Temp: 97.6 F (36.4 C) 98.8 F (37.1 C)  98.2 F (36.8 C)  TempSrc: Oral Oral  Oral  Resp: 17 18 19    Height:      Weight:      SpO2: 94% 94% 96%     Intake/Output Summary (Last 24 hours) at 10/07/15 1036 Last data filed at 10/07/15 0700  Gross per 24 hour  Intake 1882.5 ml  Output      0 ml  Net 1882.5 ml   Filed Weights   10/05/15 1715 10/05/15 2200  Weight: 119.75 kg (264 lb) 119.5 kg (263 lb 7.2 oz)    Subjective No complaints today. Denies chest pain.  Marland Kitchen apixaban  5 mg Oral BID  . aspirin EC  81 mg Oral Daily  . budesonide-formoterol  2 puff Inhalation BID  . donepezil  10 mg Oral QHS  . exemestane  25 mg Oral QPC breakfast  . insulin aspart  0-15 Units Subcutaneous TID WC  . insulin aspart  0-5 Units Subcutaneous QHS  . metoCLOPramide  5 mg Oral BID  . traZODone  100 mg Oral QHS  . vancomycin  1,250 mg Intravenous Q24H      LABS: Basic Metabolic Panel:  Recent Labs  10/05/15 2120 10/06/15 1025  NA 137 141  K 4.4 4.2  CL 99* 101  CO2 29 34*  GLUCOSE 121* 130*  BUN 11 8  CREATININE 0.94 0.97  CALCIUM 8.5* 8.6*   Liver Function Tests:  Recent Labs  10/05/15 2120 10/06/15 1025  AST 19 12*  ALT 6* 9*  ALKPHOS 61 59  BILITOT 1.2 0.8  PROT 6.0* 5.6*  ALBUMIN 3.0* 2.7*   No results for input(s): LIPASE, AMYLASE in the last 72 hours. CBC:  Recent Labs  10/05/15 1748 10/05/15 2120 10/06/15 1025  WBC 10.8*  --  8.1  NEUTROABS  --  7.6 5.8  HGB 11.8*  --  11.1*  HCT 37.0  --  34.8*  MCV 83.5  --  84.1  PLT 250  --  214   Cardiac Enzymes:  Recent Labs  10/05/15 2120 10/06/15 0330 10/06/15 1025  TROPONINI <0.03 <0.03 <0.03   BNP: No results for input(s): PROBNP in the last 72 hours. D-Dimer: No results for input(s): DDIMER in the  last 72 hours. Hemoglobin A1C:  Recent Labs  10/05/15 2120  HGBA1C 5.9*   Fasting Lipid Panel: No results for input(s): CHOL, HDL, LDLCALC, TRIG, CHOLHDL, LDLDIRECT in the last 72 hours. Thyroid Function Tests: No results for input(s): TSH, T4TOTAL, T3FREE, THYROIDAB in the last 72 hours.  Invalid input(s): FREET3   Radiology/Studies:  Dg Chest Portable 1 View  10/05/2015  CLINICAL DATA:  Generalized chest pain EXAM: PORTABLE CHEST 1 VIEW COMPARISON:  03/19/2015 FINDINGS: Cardiomediastinal silhouette is stable. No acute infiltrate or pleural effusion. No pulmonary edema. No pneumothorax. IMPRESSION: No active disease. Electronically Signed   By: Lahoma Crocker M.D.   On: 10/05/2015 18:24   Echo: 10/07/15:Study Conclusions  - Left ventricle: The cavity size was normal. Wall thickness was normal. Systolic function was normal. The estimated ejection fraction was in the range of 55% to 60%. Wall motion was normal; there were no regional wall motion abnormalities. Doppler parameters are consistent with elevated mean left atrial filling pressure. -  Mitral valve: Calcified annulus. Mild regurgitation. Valve area by continuity equation (using LVOT flow): 2.53cm^2. - Left atrium: The atrium was moderately dilated. - Right ventricle: The cavity size was moderately dilated. - Right atrium: The atrium was mildly dilated. - Pulmonary arteries: Systolic pressure was mildly increased. PA peak pressure: 72mm Hg (S). - Pericardium, extracardiac: A trivial pericardial effusion was identified.  PHYSICAL EXAM General: Well developed, obese, in no acute distress. Head: Normal Neck: Negative for carotid bruits. JVD not elevated. No adenopathy Lungs: Clear bilaterally to auscultation without wheezes, rales, or rhonchi. Heart: IRRR S1 S2 without murmurs, rubs, or gallops.  Abdomen: Soft, non-tender, non-distended with normoactive bowel sounds.  Extremities: No clubbing, cyanosis or  edema.  Distal pedal pulses are 2+ and equal bilaterally. Neuro: Alert and oriented X 3. Moves all extremities spontaneously. Psych:  Responds to questions appropriately with a normal affect.  ASSESSMENT AND PLAN: 74 year old morbidly obese female with chronic AF on Eliquis, COPD, chronic diastolic CHF (EF 78-46% 96/29), breast cancer, diabetes mellitus, dyslipidemia, hypertension and morbid obesity with hypoventilation syndrome, obstructive sleep apnea and sarcoid. CHADSVASC 6. On Eliquis. She presented with a "hurting" across chest which resolved spontaneously. Angina related to hypotension in setting of pulm HTN Troponin is negative thus far. Her EKG shows chronic A. Fib with no acute ischemic changes. A 2-D echocardiogram shows normal LV function and mild to moderate pulmonary HTN. This is unchanged from prior studies.  No further cardiac work up recommended. ARB, diuretics and Cardizem held due to hypotension. May need to resume cardizem for HR control.   She currently appears euvolemic. Chest x-ray shows no active disease.   Will sign off now. Please call with questions.  Present on Admission:  . Hypotension . PULMONARY SARCOIDOSIS . Obesity hypoventilation syndrome ([place]) . Hyperlipidemia . Atrial fibrillation, persistent ([place]) . Chronic diastolic CHF (congestive heart failure) ([place]) . Breast cancer of lower-inner quadrant of right female breast ([place]) . Chronic respiratory failure with hypoxia ([place]) . Dementia . Rash of groin . Chest pain  Signed, Margaretha Mahan [place], [place] 10/07/2015 10:36 AM

## 2015-10-07 NOTE — Clinical Social Work Note (Signed)
Clinical Social Work Assessment  Patient Details  Name: [name] MRN: [id number] Date of Birth: 08-12-1941  Date of referral:  10/07/15               Reason for consult:  Facility Placement                Permission sought to share information with:  Facility Sport and exercise psychologist, Family Supports Permission granted to share information::  No (disoriented)  Name::     [name]  Agency::  [facility]; [facility]  Relationship::  son  Contact Information:     Housing/Transportation Living arrangements for the past 2 months:  [place] of Information:  Adult Children Patient Interpreter Needed:  None Criminal Activity/Legal Involvement Pertinent to Current Situation/Hospitalization:  No - Comment as needed Significant Relationships:  Adult Children Lives with:  Facility Resident Do you feel safe going back to the place where you live?  Yes Need for family participation in patient care:  Yes (Comment) (decision making)  Care giving concerns: Pt lives at [facility]- they are unable to handle her given current mobility   Facilities manager / plan:  CSW spoke with pt son about return to [facility] vs potential need for SNF  Employment status:  Retired Forensic scientist:  Medicare PT Recommendations:  [place] / Referral to community resources:  [place]  Patient/Family's Response to care:  Pt son is agreeable to MD recommendations for pt care- would prefer for pt to return to [facility] but is agreeable to SNF if that is what is needed  Patient/Family's Understanding of and Emotional Response to Diagnosis, Current Treatment, and Prognosis:  No questions or concerns- pt son is hopeful that pt can return to [facility] soon  Emotional Assessment Appearance:  Appears stated age Attitude/Demeanor/Rapport:  Unable to Assess Affect (typically observed):  Unable to Assess Orientation:   Fluctuating Orientation (Suspected and/or reported [place]) Alcohol / Substance use:    Psych involvement (Current and /or in the community):  No (Comment)  Discharge Needs  Concerns to be addressed:  Care Coordination Readmission within the last 30 days:  No Current discharge risk:  Physical Impairment Barriers to Discharge:  Continued Medical Work up   Sharon Mon, LCSW 10/07/2015, 4:46 PM

## 2015-10-08 LAB — GLUCOSE, CAPILLARY
GLUCOSE-CAPILLARY: 163 mg/dL — AB (ref 65–99)
Glucose-Capillary: 243 mg/dL — ABNORMAL HIGH (ref 65–99)
Glucose-Capillary: 160 mg/dL — ABNORMAL HIGH (ref 65–99)
Glucose-Capillary: 224 mg/dL — ABNORMAL HIGH (ref 65–99)
Glucose-Capillary: 194 mg/dL — ABNORMAL HIGH (ref 65–99)

## 2015-10-08 LAB — CBC
HEMATOCRIT: 34.4 % — AB (ref 36.0–46.0)
Hemoglobin: 10.9 g/dL — ABNORMAL LOW (ref 12.0–15.0)
MCH: 26.6 pg (ref 26.0–34.0)
MCHC: 31.7 g/dL (ref 30.0–36.0)
MCV: 83.9 fL (ref 78.0–100.0)
PLATELETS: 216 10*3/uL (ref 150–400)
RBC: 4.1 MIL/uL (ref 3.87–5.11)
RDW: 15.7 % — ABNORMAL HIGH (ref 11.5–15.5)
WBC: 8.7 10*3/uL (ref 4.0–10.5)

## 2015-10-08 LAB — BASIC METABOLIC PANEL
Anion gap: 6 (ref 5–15)
BUN: 6 mg/dL (ref 6–20)
CHLORIDE: 98 mmol/L — AB (ref 101–111)
CO2: 32 mmol/L (ref 22–32)
Calcium: 8.2 mg/dL — ABNORMAL LOW (ref 8.9–10.3)
Creatinine, Ser: 0.77 mg/dL (ref 0.44–1.00)
GFR calc Af Amer: >60 mL/min (ref >60)
GLUCOSE: 139 mg/dL — AB (ref 65–99)
POTASSIUM: 3.4 mmol/L — AB (ref 3.5–5.1)
Sodium: 136 mmol/L (ref 135–145)

## 2015-10-08 LAB — URINE CULTURE: Culture: NO GROWTH

## 2015-10-08 MED ORDER — METOPROLOL TARTRATE 1 MG/ML IV SOLN
5.0000 mg | Freq: Once | INTRAVENOUS | Status: AC
  Administered 2015-10-08: 5 mg via INTRAVENOUS
  Filled 2015-10-08: qty 5

## 2015-10-08 MED ORDER — POTASSIUM CHLORIDE CRYS ER 20 MEQ PO TBCR
40.0000 meq | EXTENDED_RELEASE_TABLET | Freq: Once | ORAL | Status: AC
  Administered 2015-10-08: 40 meq via ORAL
  Filled 2015-10-08: qty 2

## 2015-10-08 MED ORDER — METOPROLOL TARTRATE 12.5 MG HALF TABLET
12.5000 mg | ORAL_TABLET | Freq: Two times a day (BID) | ORAL | Status: DC
Start: 2015-10-08 — End: 2015-10-09
  Administered 2015-10-08: 12.5 mg via ORAL
  Filled 2015-10-08: qty 1

## 2015-10-08 NOTE — Clinical Social Work Note (Signed)
CSW talked with admissions director, Jeani Hawking at [facility] regarding patient and son's choice of their facility.  Jeani Hawking informed that patient should be ready the weekend for discharge.  She was provided with the son's name and phone number so that she could make contact regarding completing admissions paperwork.  CSW will continue to follow and patient will provide handoff to weekend CSW if patient ready for discharge over weekend.  Gailyn Crook Givens, MSW, LCSW Licensed Clinical Social Worker [place] [phone]

## 2015-10-08 NOTE — Progress Notes (Signed)
10/08/2015 5:32 PM  This is a late entry.  Received phone call from central tele relaying that patient's heart rate was in the 120-140 range nonsustained.  These events increased in frequency over the course of about thirty minutes or so to where she was constantly sustaining in the 120s with nonsustained events up to 140 range.  Pt asymptomatic, denying chest pain, SOB or palpitations.  Vitals stable.  Dr. Tana Coast notified, orders for metoprolol received.  Medication administered, and shortly thereafter HR decreased to the low 100-110 range.  Pt continues to remain asymptomatic.  Will continue to monitor. [name]

## 2015-10-08 NOTE — Progress Notes (Signed)
ANTIBIOTIC CONSULT NOTE - FOLLOW UP  Pharmacy Consult for Vancomycin Indication: cellulitis, 1/2 blood cx GPC  Allergies  Allergen Reactions  . Aspirin Other (See Comments)    Avoids due to being on blood thinners  . Ibuprofen Other (See Comments)    Avoids due to being on blood thinners  . Contrast Media [Iodinated Diagnostic Agents] Nausea And Vomiting  . Pravachol Other (See Comments)    Muscle pain    Patient Measurements: Height: 4\' 11"  (149.9 cm) Weight: 268 lb (121.564 kg) IBW/kg (Calculated) : 43.2  Vital Signs: Temp: 98.5 F (36.9 C) (10/27 0748) Temp Source: Oral (10/27 0748) BP: 117/64 mmHg (10/27 0748) Pulse Rate: 98 (10/27 0903) Intake/Output from previous day: 10/26 0701 - 10/27 0700 In: 1300 [P.O.:1000; IV Piggyback:300] Out: 0  Intake/Output from this shift: Total I/O In: 240 [P.O.:240] Out: -   Labs:  Recent Labs  10/05/15 1748 10/05/15 2120 10/06/15 1025 10/08/15 0409  WBC 10.8*  --  8.1 8.7  HGB 11.8*  --  11.1* 10.9*  PLT 250  --  214 216  CREATININE 1.01* 0.94 0.97 0.77   Estimated Creatinine Clearance: 70.5 mL/min (by C-G formula based on Cr of 0.77). No results for input(s): VANCOTROUGH, VANCOPEAK, VANCORANDOM, GENTTROUGH, GENTPEAK, GENTRANDOM, TOBRATROUGH, TOBRAPEAK, TOBRARND, AMIKACINPEAK, AMIKACINTROU, AMIKACIN in the last 72 hours.   Microbiology: Recent Results (from the past 720 hour(s))  Culture, blood (x 2)     Status: None (Preliminary result)   Collection Time: 10/05/15  9:20 PM  Result Value Ref Range Status   Specimen Description BLOOD RIGHT HAND  Final   Special Requests BOTTLES DRAWN AEROBIC AND ANAEROBIC 5CC   Final   Culture NO GROWTH 2 DAYS  Final   Report Status PENDING  Incomplete  Culture, blood (x 2)     Status: None (Preliminary result)   Collection Time: 10/05/15  9:26 PM  Result Value Ref Range Status   Specimen Description BLOOD LEFT HAND  Final   Special Requests BOTTLES DRAWN AEROBIC AND ANAEROBIC 4CC   Final   Culture  Setup Time   Final    GRAM POSITIVE COCCI IN PAIRS IN CLUSTERS AEROBIC BOTTLE ONLY CRITICAL RESULT CALLED TO, READ BACK BY AND VERIFIED WITH: Ave Filter RN 11:50 10/07/15 ([email or web address])    Culture NO GROWTH 2 DAYS  Final   Report Status PENDING  Incomplete  MRSA PCR Screening     Status: None   Collection Time: 10/05/15 10:37 PM  Result Value Ref Range Status   MRSA by PCR NEGATIVE NEGATIVE Final    Comment:        The GeneXpert MRSA Assay (FDA approved for NASAL specimens only), is one component of a comprehensive MRSA colonization surveillance program. It is not intended to diagnose MRSA infection nor to guide or monitor treatment for MRSA infections.   Urine culture     Status: None   Collection Time: 10/07/15  8:33 AM  Result Value Ref Range Status   Specimen Description URINE, RANDOM  Final   Special Requests NONE  Final   Culture NO GROWTH 1 DAY  Final   Report Status 10/08/2015 FINAL  Final    Anti-infectives    Start     Dose/Rate Route Frequency Ordered Stop   10/07/15 1100  cefTRIAXone (ROCEPHIN) 1 g in dextrose 5 % 50 mL IVPB     1 g 100 mL/hr over 30 Minutes Intravenous Every 24 hours 10/07/15 1036     10/06/15 2200  vancomycin (  VANCOCIN) 1,250 mg in sodium chloride 0.9 % 250 mL IVPB     1,250 mg 166.7 mL/hr over 90 Minutes Intravenous Every 24 hours 10/06/15 0840     10/05/15 2130  vancomycin (VANCOCIN) 2,000 mg in sodium chloride 0.9 % 500 mL IVPB     2,000 mg 250 mL/hr over 120 Minutes Intravenous  Once 10/05/15 2117 10/06/15 0201      Assessment: 74 yo F initially started on Vancomycin for cellulitis which has now been determined to be fungal in nature and treating with topic antifungal powder/cream.  Pt was started on Rocephin 10/25 for empiric UTI coverage - urine cx now resulted as negative.    Pt continues on both antibiotics with 1/2 positive blood cx = GPC.  Per MD, will continue abx until cx final to rule out bacteremia vs contaminant.   Pt is afebrile, WBC wnl, renal function stable.  Goal of Therapy:  Vancomycin trough level 15-20 mcg/ml  Plan:  Continue Vancomycin 1250 mg IV q24h. Follow up cx results and antibiotic plans.  [employer], Pharm.D., BCPS Clinical Pharmacist Pager [phone] 10/08/2015 10:33 AM

## 2015-10-08 NOTE — Progress Notes (Signed)
[facility]                                                                              Patient Demographics  [name], is a 74 y.o. female, DOB - 1941-08-12, YIF:[id number]  Admit date - 10/05/2015   Admitting Physician Lavina Hamman, MD  Outpatient Primary MD for the patient is Shirline Frees, MD  LOS - 3   Chief Complaint  Patient presents with  . Chest Pain       Brief HPI  Patient is a 74 year old female with sarcoidosis, pulmonary hypertension, chronic diastolic CHF, essential hypertension, chronic atrial ablation on antifungal medication, dyslipidemia, OSA on CPAP, diabetes presented with chest pain. The patient reported that she was at her baseline but around 2 PM on the day of admission she started having burning chest pain across her chest and shortness of breath. Patient was sent from skilled nursing facility. In the ER, patient was chest pain-free.    Assessment & Plan   Primary problem Atypical chest pain  -  Resolved, EKG nonacute, troponin negative 3,  - cardiology was consulted. Continue aspirin, Eliquis  - 2-D echo showed EF of 60-65% with normal wall motion, no regional wall motion abnormalities, moderate tricuspid regurgitation.  Active problems Sepsis upon admission, GPC bacteremia. Presumed source was cellulitis of the groin, UTI - Continue vancomycin along with antifungal powder.  - Blood cultures 1/2 positive for gram-positive cocci - continue IV Rocephin, follow urine culture sensitivities, blood cultures   pulmonary Sarcoid - continue supportive care.  Chronic atrial fibrillation  - [place] Vasc score of 4 - continue Eliquis - Currently Rate controlled, add Cardizem if BP allows  Obstructive sleep apnea. - Continue CPAP at night    Chronic diastolic dysfunction/CHF.  - Currently compensated, 2-D echo showed EF of 60-65%.   Early dementia. Supportive care, at risk for delirium, minimize benzodiazepines and narcotics,  continue home medications.    DM type II.  - Continue sliding scale insulin and monitor glycemic control. - CBGs controlled  Hypokalemia Replaced  Code Status: Full CODE STATUS  Family Communication: Discussed in detail with the patient, all imaging results, lab results explained to the patient    Disposition Plan: Will need skilled nursing facility, final blood cultures, urine cultures and sensitivities pending  Time Spent in minutes  25 minutes  Procedures  echo  Consults   cardiology  DVT Prophylaxis apixaban  Medications  Scheduled Meds: . apixaban  5 mg Oral BID  . aspirin EC  81 mg Oral Daily  . budesonide-formoterol  2 puff Inhalation BID  . cefTRIAXone (ROCEPHIN)  IV  1 g Intravenous Q24H  . donepezil  10 mg Oral QHS  . exemestane  25 mg Oral QPC breakfast  . insulin aspart  0-15 Units Subcutaneous TID WC  . insulin aspart  0-5 Units Subcutaneous QHS  . metoCLOPramide  5 mg Oral BID  . traZODone  100 mg Oral QHS  . vancomycin  1,250 mg Intravenous Q24H   Continuous Infusions:  PRN Meds:.acetaminophen, ondansetron (ZOFRAN) IV   Antibiotics   Anti-infectives    Start  Dose/Rate Route Frequency Ordered Stop   10/07/15 1100  cefTRIAXone (ROCEPHIN) 1 g in dextrose 5 % 50 mL IVPB     1 g 100 mL/hr over 30 Minutes Intravenous Every 24 hours 10/07/15 1036     10/06/15 2200  vancomycin (VANCOCIN) 1,250 mg in sodium chloride 0.9 % 250 mL IVPB     1,250 mg 166.7 mL/hr over 90 Minutes Intravenous Every 24 hours 10/06/15 0840     10/05/15 2130  vancomycin (VANCOCIN) 2,000 mg in sodium chloride 0.9 % 500 mL IVPB     2,000 mg 250 mL/hr over 120 Minutes Intravenous  Once 10/05/15 2117 10/06/15 0201        Subjective:   Floride Hutmacher was seen and examined today.  feeling much better today, no fevers.  Patient denies dizziness, abdominal pain, N/V/D/C, new weakness, numbess, tingling. No acute events overnight.  No chest pain or shortness of  breath.  Objective:   Blood pressure 117/64, pulse 98, temperature 98.5 F (36.9 C), temperature source Oral, resp. rate 18, height 4\' 11"  (1.499 m), weight 121.564 kg (268 lb), SpO2 100 %.  Wt Readings from Last 3 Encounters:  10/07/15 121.564 kg (268 lb)  09/15/15 119.84 kg (264 lb 3.2 oz)  03/19/15 123.016 kg (271 lb 3.2 oz)     Intake/Output Summary (Last 24 hours) at 10/08/15 1028 Last data filed at 10/08/15 0907  Gross per 24 hour  Intake   1140 ml  Output      0 ml  Net   1140 ml    Exam  General: Alert and oriented x 3, NAD  HEENT:  PERRLA, EOMI, Anicteric Sclera, mucous membranes moist.   Neck: Supple, no JVD, no masses  CVS: S1 S2 clear, RRR  Respiratory:CTA B   Abdomen: Soft, nontender, nondistended, + bowel sounds  Ext: no cyanosis clubbing or edema  Neuro:no new deficits   Skin: No rashes  Psych: Normal affect and demeanor, alert and oriented x3    Data Review   Micro Results Recent Results (from the past 240 hour(s))  Culture, blood (x 2)     Status: None (Preliminary result)   Collection Time: 10/05/15  9:20 PM  Result Value Ref Range Status   Specimen Description BLOOD RIGHT HAND  Final   Special Requests BOTTLES DRAWN AEROBIC AND ANAEROBIC 5CC   Final   Culture NO GROWTH 2 DAYS  Final   Report Status PENDING  Incomplete  Culture, blood (x 2)     Status: None (Preliminary result)   Collection Time: 10/05/15  9:26 PM  Result Value Ref Range Status   Specimen Description BLOOD LEFT HAND  Final   Special Requests BOTTLES DRAWN AEROBIC AND ANAEROBIC 4CC  Final   Culture  Setup Time   Final    GRAM POSITIVE COCCI IN PAIRS IN CLUSTERS AEROBIC BOTTLE ONLY CRITICAL RESULT CALLED TO, READ BACK BY AND VERIFIED WITH: Ave Filter RN 11:50 10/07/15 ([email or web address])    Culture NO GROWTH 2 DAYS  Final   Report Status PENDING  Incomplete  MRSA PCR Screening     Status: None   Collection Time: 10/05/15 10:37 PM  Result Value Ref Range Status   MRSA by PCR  NEGATIVE NEGATIVE Final    Comment:        The GeneXpert MRSA Assay (FDA approved for NASAL specimens only), is one component of a comprehensive MRSA colonization surveillance program. It is not intended to diagnose MRSA infection nor to guide or monitor treatment  for MRSA infections.   Urine culture     Status: None   Collection Time: 10/07/15  8:33 AM  Result Value Ref Range Status   Specimen Description URINE, RANDOM  Final   Special Requests NONE  Final   Culture NO GROWTH 1 DAY  Final   Report Status 10/08/2015 FINAL  Final    Radiology Reports Dg Chest Portable 1 View  10/05/2015  CLINICAL DATA:  Generalized chest pain EXAM: PORTABLE CHEST 1 VIEW COMPARISON:  03/19/2015 FINDINGS: Cardiomediastinal silhouette is stable. No acute infiltrate or pleural effusion. No pulmonary edema. No pneumothorax. IMPRESSION: No active disease. Electronically Signed   By: Lahoma Crocker M.D.   On: 10/05/2015 18:24    CBC  Recent Labs Lab 10/05/15 1748 10/05/15 2120 10/06/15 1025 10/08/15 0409  WBC 10.8*  --  8.1 8.7  HGB 11.8*  --  11.1* 10.9*  HCT 37.0  --  34.8* 34.4*  PLT 250  --  214 216  MCV 83.5  --  84.1 83.9  MCH 26.6  --  26.8 26.6  MCHC 31.9  --  31.9 31.7  RDW 15.8*  --  15.8* 15.7*  LYMPHSABS  --  1.6 1.5  --   MONOABS  --  0.8 0.5  --   EOSABS  --  0.4 0.3  --   BASOSABS  --  0.0 0.0  --     Chemistries   Recent Labs Lab 10/05/15 1748 10/05/15 2120 10/06/15 1025 10/08/15 0409  NA 140 137 141 136  K 4.1 4.4 4.2 3.4*  CL 99* 99* 101 98*  CO2 32 29 34* 32  GLUCOSE 159* 121* 130* 139*  BUN 11 11 8 6   CREATININE 1.01* 0.94 0.97 0.77  CALCIUM 8.9 8.5* 8.6* 8.2*  AST  --  19 12*  --   ALT  --  6* 9*  --   ALKPHOS  --  61 59  --   BILITOT  --  1.2 0.8  --    ------------------------------------------------------------------------------------------------------------------ estimated creatinine clearance is 70.5 mL/min (by C-G formula based on Cr of  0.77). ------------------------------------------------------------------------------------------------------------------  Recent Labs  10/05/15 2120  HGBA1C 5.9*   ------------------------------------------------------------------------------------------------------------------ No results for input(s): CHOL, HDL, LDLCALC, TRIG, CHOLHDL, LDLDIRECT in the last 72 hours. ------------------------------------------------------------------------------------------------------------------ No results for input(s): TSH, T4TOTAL, T3FREE, THYROIDAB in the last 72 hours.  Invalid input(s): FREET3 ------------------------------------------------------------------------------------------------------------------ No results for input(s): VITAMINB12, FOLATE, FERRITIN, TIBC, IRON, RETICCTPCT in the last 72 hours.  Coagulation profile  Recent Labs Lab 10/05/15 2120 10/06/15 1025  INR 1.26 1.39    No results for input(s): DDIMER in the last 72 hours.  Cardiac Enzymes  Recent Labs Lab 10/05/15 2120 10/06/15 0330 10/06/15 1025  TROPONINI <0.03 <0.03 <0.03   ------------------------------------------------------------------------------------------------------------------ Invalid input(s): POCBNP   Recent Labs  10/07/15 0832 10/07/15 1235 10/07/15 1741 10/07/15 2015 10/07/15 2226 10/08/15 0722  GLUCAP 145* 167* 167* 219* 157* 163*     Rc Amison M.D. [facility] 10/08/2015, 10:28 AM  Pager: [phone] Between 7am to 7pm - call Pager - [phone]  After 7pm go to [email or web address] - password [email or web address]  Call night coverage person covering after 7pm

## 2015-10-08 NOTE — Care Management Important Message (Signed)
Important Message  Patient Details  Name: [name] MRN: [id number] Date of Birth: [birth year]   Medicare Important Message Given:  Yes-second notification given    Nathen May 10/08/2015, 10:43 AM

## 2015-10-09 ENCOUNTER — Telehealth: Payer: Self-pay | Admitting: Internal Medicine

## 2015-10-09 LAB — CBC
HCT: 34.7 % — ABNORMAL LOW (ref 36.0–46.0)
HEMOGLOBIN: 11.4 g/dL — AB (ref 12.0–15.0)
MCH: 27.4 pg (ref 26.0–34.0)
MCHC: 32.9 g/dL (ref 30.0–36.0)
MCV: 83.4 fL (ref 78.0–100.0)
PLATELETS: 220 10*3/uL (ref 150–400)
RBC: 4.16 MIL/uL (ref 3.87–5.11)
RDW: 15.6 % — ABNORMAL HIGH (ref 11.5–15.5)
WBC: 11.1 10*3/uL — AB (ref 4.0–10.5)

## 2015-10-09 LAB — BASIC METABOLIC PANEL
ANION GAP: 10 (ref 5–15)
BUN: 6 mg/dL (ref 6–20)
CHLORIDE: 97 mmol/L — AB (ref 101–111)
CO2: 31 mmol/L (ref 22–32)
CREATININE: 0.78 mg/dL (ref 0.44–1.00)
Calcium: 8.7 mg/dL — ABNORMAL LOW (ref 8.9–10.3)
GFR calc non Af Amer: >60 mL/min (ref >60)
Glucose, Bld: 176 mg/dL — ABNORMAL HIGH (ref 65–99)
POTASSIUM: 3.9 mmol/L (ref 3.5–5.1)
SODIUM: 138 mmol/L (ref 135–145)

## 2015-10-09 LAB — GLUCOSE, CAPILLARY
GLUCOSE-CAPILLARY: 192 mg/dL — AB (ref 65–99)
GLUCOSE-CAPILLARY: 277 mg/dL — AB (ref 65–99)
Glucose-Capillary: 235 mg/dL — ABNORMAL HIGH (ref 65–99)

## 2015-10-09 LAB — CULTURE, BLOOD (ROUTINE X 2)

## 2015-10-09 MED ORDER — FUROSEMIDE 80 MG PO TABS: 40.0000 mg | ORAL_TABLET | Freq: Two times a day (BID) | ORAL | Status: DC

## 2015-10-09 MED ORDER — DILTIAZEM HCL ER COATED BEADS 120 MG PO CP24
240.0000 mg | ORAL_CAPSULE | Freq: Every morning | ORAL | Status: DC
  Administered 2015-10-09: 240 mg via ORAL
  Filled 2015-10-09: qty 2

## 2015-10-09 MED ORDER — POTASSIUM CHLORIDE CRYS ER 20 MEQ PO TBCR: 20.0000 meq | EXTENDED_RELEASE_TABLET | Freq: Every day | ORAL | Status: DC

## 2015-10-09 MED ORDER — CEPHALEXIN 500 MG PO CAPS: 500.0000 mg | ORAL_CAPSULE | Freq: Three times a day (TID) | ORAL | Status: DC

## 2015-10-09 MED ORDER — FUROSEMIDE 80 MG PO TABS
40.0000 mg | ORAL_TABLET | Freq: Every day | ORAL | Status: DC
Start: 2015-10-09 — End: 2016-01-26

## 2015-10-09 MED ORDER — COLCHICINE 0.6 MG PO TABS: 0.6000 mg | ORAL_TABLET | Freq: Every day | ORAL | Status: DC | PRN

## 2015-10-09 NOTE — Progress Notes (Signed)
Patient discharged to [facility].  Assessment unchanged from morning.  Son was notified by day shift RN.

## 2015-10-09 NOTE — Discharge Summary (Addendum)
Physician Discharge Summary   Patient ID: [name] MRN: [id number] DOB/AGE: [birth year] 74 y.o.  Admit date: 10/05/2015 Discharge date: 10/09/2015  Primary Care Physician:  Shirline Frees, MD  Discharge Diagnoses:   . Atypical Chest pain    Cellulitis . Hypotension . PULMONARY SARCOIDOSIS . Obesity hypoventilation syndrome ([place]) . Hyperlipidemia . Atrial fibrillation, persistent ([place]) . Chronic diastolic CHF (congestive heart failure) ([place]) . Breast cancer of lower-inner quadrant of right female breast ([place]) . Chronic respiratory failure with hypoxia ([place]) . Dementia   Consults: Cardiology   Recommendations for Outpatient Follow-up:  Continue CPAP at night  Losartan currently on hold, had presented with hypotension   Please continue Physical therapy, OOB to chair and increase activity level at the facility to gain strength and endurance  TESTS THAT NEED FOLLOW-UP CBC, BMET   DIET: Carb modified diet  Allergies:   Allergies  Allergen Reactions  . Aspirin Other (See Comments)    Avoids due to being on blood thinners  . Ibuprofen Other (See Comments)    Avoids due to being on blood thinners  . Contrast Media [Iodinated Diagnostic Agents] Nausea And Vomiting  . Pravachol Other (See Comments)    Muscle pain     Discharge Medications:   Medication List    STOP taking these medications        losartan 50 MG tablet  Commonly known as:  COZAAR      TAKE these medications        acetaminophen 500 MG tablet  Commonly known as:  TYLENOL  Take 1,000 mg by mouth every 6 (six) hours as needed. pain     albuterol 108 (90 BASE) MCG/ACT inhaler  Commonly known as:  PROVENTIL HFA;VENTOLIN HFA  Inhale 2 puffs into the lungs 4 (four) times daily as needed for wheezing or shortness of breath.     apixaban 5 MG Tabs tablet  Commonly known as:  ELIQUIS  TAKE 1 TABLET (5 MG TOTAL) BY MOUTH 2 (TWO) TIMES DAILY.     cephALEXin 500 MG capsule  Commonly known  as:  KEFLEX  Take 1 capsule (500 mg total) by mouth 3 (three) times daily. X 4 days  Start taking on:  10/10/2015     colchicine 0.6 MG tablet  Take 1 tablet (0.6 mg total) by mouth daily as needed (gout flare).     diltiazem 240 MG 24 hr capsule  Commonly known as:  CARDIZEM CD  Take 240 mg by mouth every morning.     donepezil 10 MG tablet  Commonly known as:  ARICEPT  Take 10 mg by mouth at bedtime.     exemestane 25 MG tablet  Commonly known as:  AROMASIN  Take 25 mg by mouth daily after breakfast.     furosemide 80 MG tablet  Commonly known as:  LASIX  Take 0.5 tablets (40 mg total) by mouth daily.     glipiZIDE 10 MG tablet  Commonly known as:  GLUCOTROL  Take 10 mg by mouth daily before breakfast.     ketoconazole 2 % cream  Commonly known as:  NIZORAL  Apply 1 application topically daily as needed for irritation.     metFORMIN 500 MG tablet  Commonly known as:  GLUCOPHAGE  Take 500 mg by mouth 2 (two) times daily with a meal.     metoCLOPramide 5 MG tablet  Commonly known as:  REGLAN  Take 5 mg by mouth 2 (two) times daily.  nystatin cream  Commonly known as:  MYCOSTATIN  Apply 1 application topically 2 (two) times daily.     ondansetron 8 MG tablet  Commonly known as:  ZOFRAN  Take 8 mg by mouth every 8 (eight) hours as needed for nausea or vomiting.     OVER THE COUNTER MEDICATION  Inhale 1 application into the lungs at bedtime. CPAP     OYSTER-CAL 500 + D PO  Take 1 tablet by mouth 2 (two) times daily.     potassium chloride SA 20 MEQ tablet  Commonly known as:  K-DUR,KLOR-CON  Take 1 tablet (20 mEq total) by mouth daily.     STOOL SOFTENER PO  Take 1 capsule by mouth daily as needed (FOR CONSTIPATION).     SYMBICORT 160-4.5 MCG/ACT inhaler  Generic drug:  budesonide-formoterol  Inhale 2 puffs into the lungs 2 (two) times daily. Rinse mouth well after use     traZODone 100 MG tablet  Commonly known as:  DESYREL  Take 100 mg by mouth at  bedtime.     vitamin B-12 1000 MCG tablet  Commonly known as:  CYANOCOBALAMIN  Take 1,000 mcg by mouth daily.         Brief H and P: For complete details please refer to admission H and P, but in brief Patient is a 74 year old female with sarcoidosis, pulmonary hypertension, chronic diastolic CHF, essential hypertension, chronic atrial ablation on antifungal medication, dyslipidemia, OSA on CPAP, diabetes presented with chest pain. The patient reported that she was at her baseline but around 2 PM on the day of admission she started having burning chest pain across her chest and shortness of breath. Patient was sent from skilled nursing facility. In the ER, patient was chest pain-free.  Hospital Course:   Atypical chest pain  Resolved, EKG nonacute, troponin negative 3,  - cardiology was consulted. Continue aspirin, Eliquis  - 2-D echo showed EF of 60-65% with normal wall motion, no regional wall motion abnormalities, moderate tricuspid regurgitation. Cardiology recommended no further cardiac workup. Patient's diuretics, ARB, Cardizem or held due to hypotension. Resume the Cardizem at the time of discharge.   Sepsis upon admission, GPC bacteremia 1/2. Presumed source was cellulitis of the groin, UTI -Patient was initially placed on vancomycin. Please continue anti-fungal powder.  - Blood cultures 1/2 positive for gram-positive cocci which resulted back as contaminant -Urine culture showed no growth. - Antibiotics transitioned to oral Keflex for 4 days to complete a full course for cellulitis/rash   pulmonary Sarcoid - continue supportive care, Follow-up outpatient with Dr. Annamaria Boots.  Chronic atrial fibrillation  - [place] Vasc score of 4 - continue Eliquis - Currently Rate controlled, continue Cardizem   Obstructive sleep apnea. - Continue CPAP at night   Chronic diastolic dysfunction/CHF.  - Currently compensated, 2-D echo showed EF of 60-65% - Lasix restarted at half the dose,  40mg  daily ( previously was on 80 mg twice a day). Please check  BMET next week. If BP allows and creatinine function is improved, may increase Lasix to 40 mg twice a day. Patient has a follow-up appointment with her cardiologist Dr. Debara Pickett on 10/27/15.   Early dementia. Supportive care, at risk for delirium, minimize benzodiazepines and narcotics, continue home medications.   DM type II.  CBGs controlled, continue metformin    Day of Discharge BP 132/60 mmHg  Pulse 84  Temp(Src) 98.5 F (36.9 C) (Oral)  Resp 18  Ht 4\' 11"  (1.499 m)  Wt 120.203 kg (265  lb)  BMI 53.49 kg/m2  SpO2 96%  Physical Exam: General: Alert and awake oriented x3 not in any acute distress. HEENT: anicteric sclera, pupils reactive to light and accommodation CVS: S1-S2 clear no murmur rubs or gallops Chest: clear to auscultation bilaterally, no wheezing rales or rhonchi Abdomen: soft nontender, nondistended, normal bowel sounds Extremities: no cyanosis, clubbing or edema noted bilaterally Neuro: Cranial nerves II-XII intact, no focal neurological deficits   The results of significant diagnostics from this hospitalization (including imaging, microbiology, ancillary and laboratory) are listed below for reference.    LAB RESULTS: Basic Metabolic Panel:  Recent Labs Lab 10/08/15 0409 10/09/15 0541  NA 136 138  K 3.4* 3.9  CL 98* 97*  CO2 32 31  GLUCOSE 139* 176*  BUN 6 6  CREATININE 0.77 0.78  CALCIUM 8.2* 8.7*   Liver Function Tests:  Recent Labs Lab 10/05/15 2120 10/06/15 1025  AST 19 12*  ALT 6* 9*  ALKPHOS 61 59  BILITOT 1.2 0.8  PROT 6.0* 5.6*  ALBUMIN 3.0* 2.7*   No results for input(s): LIPASE, AMYLASE in the last 168 hours. No results for input(s): AMMONIA in the last 168 hours. CBC:  Recent Labs Lab 10/06/15 1025 10/08/15 0409 10/09/15 0541  WBC 8.1 8.7 11.1*  NEUTROABS 5.8  --   --   HGB 11.1* 10.9* 11.4*  HCT 34.8* 34.4* 34.7*  MCV 84.1 83.9 83.4  PLT 214 216 220    Cardiac Enzymes:  Recent Labs Lab 10/06/15 0330 10/06/15 1025  TROPONINI <0.03 <0.03   BNP: Invalid input(s): POCBNP CBG:  Recent Labs Lab 10/09/15 0729 10/09/15 1104  GLUCAP 192* 277*    Significant Diagnostic Studies:  Dg Chest Portable 1 View  10/05/2015  CLINICAL DATA:  Generalized chest pain EXAM: PORTABLE CHEST 1 VIEW COMPARISON:  03/19/2015 FINDINGS: Cardiomediastinal silhouette is stable. No acute infiltrate or pleural effusion. No pulmonary edema. No pneumothorax. IMPRESSION: No active disease. Electronically Signed   By: Lahoma Crocker M.D.   On: 10/05/2015 18:24    2D ECHO: Study Conclusions  - Left ventricle: The cavity size was normal. Wall thickness was increased in a pattern of mild LVH. Systolic function was normal. The estimated ejection fraction was in the range of 60% to 65%. Wall motion was normal; there were no regional wall motion abnormalities. - Aortic valve: Moderately calcified annulus. - Left atrium: The atrium was moderately dilated. - Right ventricle: The cavity size was mildly dilated. Systolic function was moderately reduced. - Tricuspid valve: There was moderate regurgitation. - Pulmonary arteries: Systolic pressure was moderately increased. PA peak pressure: 46 mm Hg (S).  Disposition and Follow-up: Discharge Instructions    (HEART FAILURE PATIENTS) Call MD:  Anytime you have any of the following symptoms: 1) 3 pound weight gain in 24 hours or 5 pounds in 1 week 2) shortness of breath, with or without a dry hacking cough 3) swelling in the hands, feet or stomach 4) if you have to sleep on extra pillows at night in order to breathe.    Complete by:  As directed      Diet Carb Modified    Complete by:  As directed      Increase activity slowly    Complete by:  As directed             DISPOSITION: SNF   DISCHARGE FOLLOW-UP Follow-up Information    Follow up with Shirline Frees, MD. Go on 10/21/2015.   Specialty:   Family Medicine  Why:  for hospital follow-up, obtain labs BMET; APPOINTMENT: Wednesday, 10-21-15 @ 12:30pm; BRING:  ID, Insurance, & List of Medications   Contact information:   [place] [address] [phone]       Follow up with Deneise Lever, MD. Go on 10/20/2015.   Specialty:  Pulmonary Disease   Why:  for hospital follow-up; APPOINTMENT:  Per Dr. Annamaria Boots Follow-up will be with Tammy, NP on Tuesday, 10-20-15 @ 11:45am   Contact information:   [place] [address] [phone]       Follow up with Pixie Casino, MD. Go on 10/27/2015.   Specialty:  Cardiology   Why:  for hospital follow-up; APPOINTMENT: Tuesday, 10-27-15 @ 10:15am; ARRIVE @ 10am; BRING: ID, [employer], & List of Medications   Contact information:   [place] [address] [phone]       Follow up with [facility].   Specialty:  [place] information:   [address] [place] [phone]       Time spent on Discharge: 35 minutes   Signed:   RAI,RIPUDEEP M.D. [facility] 10/09/2015, 3:36 PM Pager: [phone]      Coding query  Sepsis presumed at admission due to cellulitis of the groin area, UTI however gram-positive bacteremia 1/2 was contaminant. It was treated with antibiotics   RAI,RIPUDEEP M.D. [facility] 10/16/2015, 6:58 AM  Pager: [phone]

## 2015-10-09 NOTE — Care Management Note (Signed)
Case Management Note  Patient Details  Name: [name] MRN: [id number] Date of Birth: 05/16/1941  Subjective/Objective:     CM following for progression and d/c planning.               Action/Plan: Plan is for pt to d/c to SNF, no HH or DME needs.   Expected Discharge Date:     10/09/2015             Expected Discharge Plan:  Skilled Nursing Facility  In-House Referral:  Clinical Social Work  Discharge planning Services  NA  Post Acute Care Choice:  NA Choice offered to:  NA  DME Arranged:  N/A DME Agency:     HH Arranged:  NA HH Agency:     Status of Service:  Completed, signed off  Medicare Important Message Given:  Yes-second notification given Date Medicare IM Given:    Medicare IM give by:    Date Additional Medicare IM Given:    Additional Medicare Important Message give by:     If discussed at [place] of Stay Meetings, dates discussed:    Additional Comments:  Adron Bene, RN 10/09/2015, 11:22 AM

## 2015-10-09 NOTE — Clinical Social Work Placement (Addendum)
   CLINICAL SOCIAL WORK PLACEMENT  NOTE 10/09/15 - DISCHARGED TO [facility]  Date:  10/09/2015  Patient Details  Name: [name] MRN: [id number] Date of Birth: 03-04-1941  Clinical Social Work is seeking post-discharge placement for this patient at the [facility] level of care (*CSW will initial, date and re-position this form in  chart as items are completed):  Yes   Patient/family provided with [place] Work Department's list of facilities offering this level of care within the geographic area requested by the patient (or if unable, by the patient's family).  Yes   Patient/family informed of their freedom to choose among providers that offer the needed level of care, that participate in Medicare, Medicaid or managed care program needed by the patient, have an available bed and are willing to accept the patient.  Yes   Patient/family informed of [facility]'s ownership interest in [facility] and [facility], as well as of the fact that they are under no obligation to receive care at these facilities.  PASRR submitted to EDS on       PASRR number received on       Existing PASRR number confirmed on  10/09/15     FL2 transmitted to all facilities in geographic area requested by pt/family on       FL2 transmitted to all facilities within larger geographic area on       Patient informed that his/her managed care company has contracts with or will negotiate with certain facilities, including the following:         YES - Patient/family informed of bed offers received.  Patient chooses bed at  [facility]     Physician recommends and patient chooses bed at      Patient to be transferred to  [facility] on  10/09/15.  Patient to be transferred to facility by  ambulance     Patient family notified on  10/09/15 of transfer.  Name of family member notified:   Son, Hanny Elsberry     PHYSICIAN Please sign FL2      Additional Comment:    _______________________________________________ Sable Feil, LCSW 10/09/2015, 6:19 PM

## 2015-10-09 NOTE — Telephone Encounter (Signed)
Called spoke with Gerri and appt scheduled to see TP for HFU on 11/8.

## 2015-10-09 NOTE — Progress Notes (Signed)
Patient's son called requested a PT/activity order, so that the patient has some activity over the weekend.  MD notified.  Jillyn Ledger, MBA, BS, RN

## 2015-10-09 NOTE — Progress Notes (Signed)
Patient not ready for CPAP at this time. Will call when ready. 

## 2015-10-09 NOTE — Telephone Encounter (Signed)
Spoke with Gerri at [facility], states pt is being discharged today and needs a 2 week hospital follow up.  [facility] has no openings.  CY please advise if you're ok with either overbooking pt or her seeing another provider.  Thanks!

## 2015-10-09 NOTE — Progress Notes (Signed)
Attempted to call report to [facility] (SNF), no answer at 17:56.  [facility] called @ 17:58, to request ambulance transport.  Was able to make contact with [facility] (SNF) @ 19:50.  Gave report to Afo at [facility].  Patient to be transported via ambulance.  Notified patient's son [name]) about transport.  Jillyn Ledger, MBA, BS, RN

## 2015-10-09 NOTE — Progress Notes (Signed)
Physical Therapy Treatment Patient Details Name: [name] MRN: [id number] DOB: 1941/01/12 Today's Date: 10/09/2015    History of Present Illness Patient is a 74 year old female with chronic AF on Eliquis, COPD, chronic diastolic CHF (EF 53-66% 44/03), breast cancer, diabetes mellitus, dyslipidemia, hypertension and morbid obesity with hypoventilation syndrome, obstructive sleep apnea and sarcoid.  She presented to the ED with chest pain and SOB.    PT Comments    Pt admitted with above diagnosis. Pt currently with functional limitations due to balance and endurance deficits. Pt only able to sit EOB 5 minutes.  Pt not feeling well.  May be self limiting.  Very little effort given today. Continue PT as able.   Pt will benefit from skilled PT to increase their independence and safety with mobility to allow discharge to the venue listed below.    Follow Up Recommendations  SNF;Supervision for mobility/OOB     Equipment Recommendations  None recommended by PT    Recommendations for Other Services       Precautions / Restrictions Precautions Precautions: Fall Restrictions Weight Bearing Restrictions: No    Mobility  Bed Mobility Overal bed mobility: Needs Assistance Bed Mobility: Supine to Sit     Supine to sit: Max assist;+2 for physical assistance     General bed mobility comments: needed assist for LEs and elevation of trunk  Transfers                 General transfer comment: stated she could not stand  Ambulation/Gait                 Stairs            Wheelchair Mobility    Modified Rankin (Stroke Patients Only)       Balance Overall balance assessment: Needs assistance;History of Falls Sitting-balance support: Bilateral upper extremity supported;Feet supported Sitting balance-Leahy Scale: Poor Sitting balance - Comments: leaning to left in sitting needing min to mod assist at times to stay in upright position.  Sat a total of 5  minutes with varying assist. Performed some LE and UE exercises while sitting.  Pt self limiting.  Postural control: Left lateral lean                          Cognition Arousal/Alertness: Awake/alert Behavior During Therapy: WFL for tasks assessed/performed Overall Cognitive Status: No family/caregiver present to determine baseline cognitive functioning                      Exercises General Exercises - Upper Extremity Shoulder Flexion: AROM;Both;10 reps;Seated Shoulder Horizontal ADduction: AROM;Both;5 reps;Seated General Exercises - Lower Extremity Ankle Circles/Pumps: AROM;Both;10 reps;Seated Long Arc Quad: AROM;Both;10 reps;Seated    General Comments        Pertinent Vitals/Pain Pain Assessment: 0-10 Pain Score: 5  Pain Location: all over Pain Descriptors / Indicators: Aching Pain Intervention(s): Limited activity within patient's tolerance;Monitored during session;Repositioned  VSS with HR 97-119 bpm.    Home Living                      Prior Function            PT Goals (current goals can now be found in the care plan section) Progress towards PT goals: Not progressing toward goals - comment (self limiting today)    Frequency  Min 2X/week    PT Plan Current plan remains appropriate  Co-evaluation             End of Session Equipment Utilized During Treatment: Gait belt;Oxygen Activity Tolerance: Patient limited by fatigue;Patient limited by pain Patient left: in bed;with call bell/phone within reach;with bed alarm set     Time: 0370-9643 PT Time Calculation (min) (ACUTE ONLY): 12 min  Charges:  $Therapeutic Activity: 8-22 mins                    G CodesIrwin Brakeman F 11-02-15, 5:16 PM [employer] [facility] [phone] (pager)

## 2015-10-09 NOTE — Telephone Encounter (Signed)
See if TP can see her please

## 2015-10-10 LAB — CULTURE, BLOOD (ROUTINE X 2): CULTURE: NO GROWTH

## 2015-10-12 NOTE — Clinical Social Work Note (Signed)
CSW received call from [facility] stating the patient was sent to their facility without a PASRR. CSW completed PASRR screen. PASRR = [id number] [place] MSW, [place], [place], [phone]

## 2015-10-13 ENCOUNTER — Non-Acute Institutional Stay (SKILLED_NURSING_FACILITY): Payer: Medicare Other | Admitting: Internal Medicine

## 2015-10-13 DIAGNOSIS — I4819 Other persistent atrial fibrillation: Secondary | ICD-10-CM

## 2015-10-13 DIAGNOSIS — J441 Chronic obstructive pulmonary disease with (acute) exacerbation: Secondary | ICD-10-CM | POA: Diagnosis not present

## 2015-10-13 DIAGNOSIS — I5032 Chronic diastolic (congestive) heart failure: Secondary | ICD-10-CM | POA: Diagnosis not present

## 2015-10-13 DIAGNOSIS — I481 Persistent atrial fibrillation: Secondary | ICD-10-CM

## 2015-10-13 DIAGNOSIS — D869 Sarcoidosis, unspecified: Secondary | ICD-10-CM | POA: Diagnosis not present

## 2015-10-13 NOTE — Progress Notes (Signed)
Patient ID: [name], female   DOB: [birth year], 74 y.o.   MRN: [id number]  Facility; [facility] Chief complaint; admission to SNF post admit to [facility] from 10/24 to 10/28  History; is a patient with many medical issues presented to hospital complaining of chest pain. This was burning quality and retrosternal but not exertionally associated. The cardiology saw the patient after her troponins were negative her EKG was nonacute. She was continued on aspirin and Eliquis. 2-D echo showed an EF of 60-65% with normal wall motion. She was relatively hypotensive on admission therefore her diuretics ARB's Carty's exam were held on admission. Her cardiac exam was resumed, Lasix was reduced from 80 twice a day to 40 a day. She was felt to have sepsis on admission with gram-positive cocci bacteremia 1 added to she it was thought she had cellulitis of the groin and a UTI. 102 gram-positive cocci blood cultures were felt to be contaminants urine culture showed no growth. Cellulitis/rash resolved after oral Keflex.   The patient lives in the local assisted living. She tells me she walks with a walker and is on chronic oxygen. Tells me she can maybe walk 30 feet stopping due to dyspnea not chest pain.  Past Medical History  Diagnosis Date  . Asthma   . Bronchitis   . Hernia   . Anemia   . Sarcoidosis ([place])   . Morbid obesity ([place])   . Sarcoid ([place])   . Dyslipidemia   . Cough   . Wheezing   . Chills   . Constipation   . Bruises easily   . Gout attack     ankle, then wrist and hands  . Hypertension   . Atrial fibrillation, persistent ([place]) 11/01/2013  . Chronic anticoagulation, on Eliquis 11/01/2013  . History of nuclear stress test 02/09/2010    dipyridamole; normal pattern of perfusion in all regions with attenuation artifact in inferior region, low risk   . Fracture of left lower leg 06/1972    "no surgery; just casted it"  . CHF (congestive heart failure) ([place])   . Pneumonia 2012; 2014   . Chronic bronchitis ([place])   . OSA on CPAP   . Type II diabetes mellitus ([place])   . Uterine cancer [facility])     s/p hysterectomy  . Breast cancer, stage 1 ([place]) 10/17/2011    s/p right lumpectomy and XRT  . On home oxygen therapy     "2L prn" (01/24/2014)    Past Surgical History  Procedure Laterality Date  . Total knee arthroplasty Bilateral 03/2010; 06/2011    left; right  . Abdominal hysterectomy  02/04/2005  . Umbilical hernia repair  02/04/2005  . Transthoracic echocardiogram  07/2008    EF, LV size is normal; RVSP normal; mod calcif of MV apparatus  . Breast biopsy Right 01/2011  . Breast lumpectomy Right 01/2011  . Tubal ligation  1970's  . Hernia repair      Current Outpatient Prescriptions on File Prior to Visit  Medication Sig Dispense Refill  . acetaminophen (TYLENOL) 500 MG tablet Take 1,000 mg by mouth every 6 (six) hours as needed. pain    . albuterol (PROVENTIL HFA;VENTOLIN HFA) 108 (90 BASE) MCG/ACT inhaler Inhale 2 puffs into the lungs 4 (four) times daily as needed for wheezing or shortness of breath.    Marland Kitchen apixaban (ELIQUIS) 5 MG TABS tablet TAKE 1 TABLET (5 MG TOTAL) BY MOUTH 2 (TWO) TIMES DAILY. 60 tablet 5  . Calcium Carbonate-Vitamin  D (OYSTER-CAL 500 + D PO) Take 1 tablet by mouth 2 (two) times daily.    . cephALEXin (KEFLEX) 500 MG capsule Take 1 capsule (500 mg total) by mouth 3 (three) times daily. X 4 days 12 capsule 0  . colchicine 0.6 MG tablet Take 1 tablet (0.6 mg total) by mouth daily as needed (gout flare). 60 tablet 0  . diltiazem (CARDIZEM CD) 240 MG 24 hr capsule Take 240 mg by mouth every morning.     Mariane Baumgarten Calcium (STOOL SOFTENER PO) Take 1 capsule by mouth daily as needed (FOR CONSTIPATION).     Marland Kitchen donepezil (ARICEPT) 10 MG tablet Take 10 mg by mouth at bedtime.    Marland Kitchen exemestane (AROMASIN) 25 MG tablet Take 25 mg by mouth daily after breakfast.    . furosemide (LASIX) 80 MG tablet Take 0.5 tablets (40 mg total) by mouth daily.    Marland Kitchen glipiZIDE  (GLUCOTROL) 10 MG tablet Take 10 mg by mouth daily before breakfast.    . ketoconazole (NIZORAL) 2 % cream Apply 1 application topically daily as needed for irritation.     . metFORMIN (GLUCOPHAGE) 500 MG tablet Take 500 mg by mouth 2 (two) times daily with a meal.     . metoCLOPramide (REGLAN) 5 MG tablet Take 5 mg by mouth 2 (two) times daily.    Marland Kitchen nystatin cream (MYCOSTATIN) Apply 1 application topically 2 (two) times daily.    . ondansetron (ZOFRAN) 8 MG tablet Take 8 mg by mouth every 8 (eight) hours as needed for nausea or vomiting.    Marland Kitchen OVER THE COUNTER MEDICATION Inhale 1 application into the lungs at bedtime. CPAP    . potassium chloride SA (K-DUR,KLOR-CON) 20 MEQ tablet Take 1 tablet (20 mEq total) by mouth daily.    . SYMBICORT 160-4.5 MCG/ACT inhaler Inhale 2 puffs into the lungs 2 (two) times daily. Rinse mouth well after use    . traZODone (DESYREL) 100 MG tablet Take 100 mg by mouth at bedtime.     . vitamin B-12 (CYANOCOBALAMIN) 1000 MCG tablet Take 1,000 mcg by mouth daily.     Social; the patient lives in an assisted living. She tells me she walks with a walker. Her exact functional status is not really clear. She is on chronic oxygen.  reports that she has never smoked. She has never used smokeless tobacco. She reports that she does not drink alcohol or use illicit drugs.  family history includes Cervical cancer in her sister; Colon cancer in her father; Diabetes in her brother, mother, and sister; Lung cancer in her brother.   Review of systems; Gen; no weight loss Heent: No oral pain swallowing difficulties or vision difficulties Breast prior breast lumpectomy in 2012 no issues that she is aware of. Respiratory; no shortness of breath, no cough, Cardiac; no exertional chest pain, no palpitations GI; no abnormal pain, no change in bowel habits. She has a known hiatal hernia but no dysphagia GU; no dysuria, no voiding difficulties Musculoskeletal; no joint  pain. Endocrine is unaware of her diabetes control. Neurologic; she has no focal weakness but states she has generalized weakness no numbness. Skin; spoke with the wound care nurse widespread candidal rash in her right gluteal growing greater than left and surrounding pannu   Physical examination Gen. patient is not in any acute distress although appears fatigued Vitals; O2 sat is 95% on 2 L respirations 24 pulse rate 72 blood pressure 124/62 HEENT; eyes appear normal, oral exam is  normal Lymph nonpalpable in the cervical clavicular or axilla rate area. Respiratory; she very shallow air entry bilaterally there is prolonged expiratory phase and expiratory wheezing. No clubbing there is accessory muscle use Cardiac; heart sounds are distant there is no murmurs no S3 her JVP is not elevated. Abdomen; morbidly obese no liver no masses no stigmata of chronic liver disease. GU bladder is not overtly distended. There is no CVA tenderness. Extremities; she has varicosities, stasis dermatitis but no real edema and no related wounds. Vascular; peripheral pulses are palpable Musculoskeletal; bilateral total knee replacements appears stable no active arthritis Neurologic; she is diffusely hyporeflexic. She barely has antigravity strength in the proximal leg flexors or abductors. He did not attempt to ambulate her. Mental status; listed as having mild dementia. Somewhat flat affect not overtly depressed. Skin; she has no pressure areas. She has a widespread likely candidal rash involving the groin area right upper thigh and associated pannus  Impression/plan #1 atypical chest pain. She is not describing neck currently; is was not felt to be cardiac. #2 respiratory distress she is somewhat tachypneic and wheezing with accessory muscle use. I see that she does carry a diagnosis of COPD as well as sarcoidosis. I think she is going to need routine beta agonists. #3 chronic diastolic heart failure, it is  difficult to be certain about this although I don't see obvious evidence of this at the bedside. The patient's Lasix has been reduced. We'll need to monitor her edema, electrolytes and blood pressure. #4; atrial fibrillation heart rate is controlled on anticoagulants. 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 She is on chronic oxygen at 2 L. #7 hypotensive in the hospital this seems to have resolved get I don't really feel the need to push more Lasix at this point #8 extensive groin rash which is likely chronic tinea I left orders for this  I am uncertain about whether this patient will be able to get back to an assisted living level of care. I did not attempt ambulator or transfer her. Major issue currently seems to be some degree of mild respiratory distress. #2

## 2015-10-16 ENCOUNTER — Telehealth: Payer: Self-pay | Admitting: Internal Medicine

## 2015-10-16 NOTE — Telephone Encounter (Signed)
Called [facility] and spoke with operator, asked to speak with [place], they transferred my call back, no one answered the phone after 10 minutes of phone ringing, no message.  Attempted to call back and line was busy.  wcb

## 2015-10-16 NOTE — Telephone Encounter (Signed)
[name] returned call, may be reached at [phone]

## 2015-10-16 NOTE — Telephone Encounter (Signed)
LM for [facility] to call office back

## 2015-10-19 NOTE — Telephone Encounter (Signed)
Called and spoke with [place] at [facility].  She is requesting that we fax Dr. Janee Morn last Cataract note that indicates patient's settings on her CPAP and oxygen settings.   Faxed notes to [phone] per her request.  Nothing further needed.  Closing encounter

## 2015-10-19 NOTE — Telephone Encounter (Signed)
Return call can be reached @ [phone].Hillery Hunter

## 2015-10-19 NOTE — Telephone Encounter (Signed)
ATC fatima with [facility]. Was transferred to nurse station, line rang numerous times, NA and no VM WCB

## 2015-10-20 ENCOUNTER — Inpatient Hospital Stay: Payer: Medicare Other | Admitting: Adult Health

## 2015-10-20 ENCOUNTER — Non-Acute Institutional Stay (SKILLED_NURSING_FACILITY): Payer: Medicare Other | Admitting: Internal Medicine

## 2015-10-20 DIAGNOSIS — I481 Persistent atrial fibrillation: Secondary | ICD-10-CM | POA: Diagnosis not present

## 2015-10-20 DIAGNOSIS — D869 Sarcoidosis, unspecified: Secondary | ICD-10-CM | POA: Diagnosis not present

## 2015-10-20 DIAGNOSIS — I4819 Other persistent atrial fibrillation: Secondary | ICD-10-CM

## 2015-10-20 DIAGNOSIS — J441 Chronic obstructive pulmonary disease with (acute) exacerbation: Secondary | ICD-10-CM

## 2015-10-27 ENCOUNTER — Ambulatory Visit (INDEPENDENT_AMBULATORY_CARE_PROVIDER_SITE_OTHER): Payer: Medicare Other | Admitting: Internal Medicine

## 2015-10-27 ENCOUNTER — Non-Acute Institutional Stay (SKILLED_NURSING_FACILITY): Payer: Medicare Other | Admitting: Internal Medicine

## 2015-10-27 VITALS — BP 106/62 | HR 70 | Ht 60.0 in | Wt 254.0 lb

## 2015-10-27 DIAGNOSIS — D869 Sarcoidosis, unspecified: Secondary | ICD-10-CM | POA: Diagnosis not present

## 2015-10-27 DIAGNOSIS — I4819 Other persistent atrial fibrillation: Secondary | ICD-10-CM

## 2015-10-27 DIAGNOSIS — G4733 Obstructive sleep apnea (adult) (pediatric): Secondary | ICD-10-CM | POA: Diagnosis not present

## 2015-10-27 DIAGNOSIS — E662 Morbid (severe) obesity with alveolar hypoventilation: Secondary | ICD-10-CM

## 2015-10-27 DIAGNOSIS — Z7901 Long term (current) use of anticoagulants: Secondary | ICD-10-CM

## 2015-10-27 DIAGNOSIS — J441 Chronic obstructive pulmonary disease with (acute) exacerbation: Secondary | ICD-10-CM | POA: Diagnosis not present

## 2015-10-27 DIAGNOSIS — I272 Other secondary pulmonary hypertension: Secondary | ICD-10-CM

## 2015-10-27 DIAGNOSIS — I481 Persistent atrial fibrillation: Secondary | ICD-10-CM | POA: Diagnosis not present

## 2015-10-27 NOTE — Patient Instructions (Signed)
Your physician wants you to follow-up in: 6 months with Dr. Hilty. You will receive a reminder letter in the mail two months in advance. If you don't receive a letter, please call our office to schedule the follow-up appointment.    

## 2015-10-27 NOTE — Progress Notes (Signed)
Patient ID: [name], female   DOB: 26-Jun-1941, 74 y.o.   MRN: [id number]                PROGRESS NOTE  DATE:  10/20/2015        FACILITY: [facility]                 LEVEL OF CARE:   SNF   Acute Visit             CHIEF COMPLAINT:  Follow up respiratory issues.     HISTORY OF PRESENT ILLNESS:  This is a patient whom I admitted to the building last week after a stay at [facility] from 10/05/2015 through 10/09/2015.    She has a complicated respiratory status including COPD and sarcoidosis.  She is on chronic oxygen and wears CPAP at night, I believe for obesity hypoventilation syndrome.    When I saw her last week, she was really quite wheezy.  I put her on routine nebulizers.  The patient states she is short of breath, but seems to be at her baseline.  Occasional dry cough, nonproductive.  I do not get the sense that things are unstable.    Past Medical History  Diagnosis Date  . Asthma   . Bronchitis   . Hernia   . Anemia   . 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 Morbid obesity ([place])   . Sarcoid ([place])   . Dyslipidemia   . Cough   . Wheezing   . Chills   . Constipation   . Bruises easily   . Gout attack     ankle, then wrist and hands  . Hypertension   . Atrial fibrillation, persistent ([place]) 11/01/2013  . Chronic anticoagulation, on Eliquis 11/01/2013  . History of nuclear stress test 02/09/2010    dipyridamole; normal pattern of perfusion in all regions with attenuation artifact in inferior region, low risk   . Fracture of left lower leg 06/1972    "no surgery; just casted it"  . CHF (congestive heart failure) ([place])   . Pneumonia 2012; 2014  . Chronic bronchitis ([place])   . OSA on CPAP   . Type II diabetes mellitus ([place])   . Uterine cancer [facility])     s/p hysterectomy  . Breast cancer, stage 1 ([place]) 10/17/2011    s/p right lumpectomy and XRT  . On home oxygen therapy     "2L prn" (01/24/2014)       CURRENT MEDICATIONS:  Medication list is reviewed.   As noted, I changed  her to albuterol nebulizers last week.    REVIEW OF SYSTEMS:    GENERAL:  The patient states she is comfortable.   HEENT:   No swallowing difficulties.   CHEST/RESPIRATORY:  States her shortness of breath on exertion is at baseline.  Dry cough.      CARDIAC:  No chest pain.   EDEMA/VARICOSITIES:  Extremities:  She has not noted any edema.   GI:  No nausea, vomiting, or diarrhea.      GU:  No dysuria.     PHYSICAL EXAMINATION:   VITAL SIGNS:     PULSE:  82.   RESPIRATIONS:  18 and unlabored.   02 SATURATIONS:  99% on 2 L.   GENERAL APPEARANCE:  The patient is not in any distress.       CHEST/RESPIRATORY:  Air entry is shallow, but there are no crackles or wheezes.  This is fairly clear and better than last  week.  No accessory muscle use.   CARDIOVASCULAR:   CARDIAC:  Heart sounds are normal.  There are no murmurs.   JVP is not elevated.   GASTROINTESTINAL:   ABDOMEN:  Soft, nontender.     CIRCULATION:   EDEMA/VARICOSITIES:  Extremities:  She has venous insufficiency, but no edema.    ASSESSMENT/PLAN:        COPD acute.  She is on chronic oxygen at 2 L and I think she is better.  I am going to continue the nebulizers.    Obesity hypoventilation/sleep apnea.  She is on CPAP at night.  There were apparently issues with regards to the setting.  I have checked Dr. Janee Morn settings here and I will adjust this to 9 cm of water.  This seems to be her home setting.    Chronic diastolic heart failure.  I do not see obvious evidence of this.    Chronic atrial fibrillation.   Heart rate is controlled on anticoagulants.     CPT CODE: 29562

## 2015-10-28 ENCOUNTER — Encounter: Payer: Self-pay | Admitting: Internal Medicine

## 2015-10-28 NOTE — Progress Notes (Signed)
OFFICE NOTE  Chief Complaint:  Hospital follow-up, no complaints  Primary Care Physician: Shirline Frees, MD  HPI:  [name] is a 74 year old female who is morbidly obese and recently had bilateral knee replacement. She also has a history of dyslipidemia, hypertension, sleep apnea, diabetes type 2, sarcoidosis, paroxysmal A-fib on Eliquis.  Unfortunately, she also had breast cancer which I understand she is now in remission for. She was recently admitted to the hospital from skilled nursing facility as she was found to be bradycardic with HR in the 40s. Per pt, this has been associated with dyspnea on exertion and at rest, 2-3 pillow orthopnea, progressively worsening LE swelling. Her pulmonologist increased the dose of Lasix to 40 mg BID but she has not started this piror to this admission. She was found to be in acute diastolic heart failure and was diureses. Her breathing had improved however she was having persistent cough and she was on an ACE inhibitor which was changed to an ARB. She improved fairly quickly with diuresis and was discharged with a weight of 244 pounds. Her weight on hospital follow-up was 236 pounds.  She has chronic dyspnea which is not any worse than it was when she was discharged.  Was recently contacted by home health who noted that she's had progressive increase in her weight over the past 2 months. Specifically, over the last week she's had an increase in her weight now up to 260 pounds. This is associated with marked increase in shortness of breath and abdominal fullness with difficulty breathing. She's had progressively increasing doses of Lasix were divided over the past week including increased to 80 mg in the morning and 40 mg at night and most recently 80 mg twice daily, however she did not report an increase in urination and her weight continues to increase.  [name] was recently hospitalized for  Atypical chest pain and a number of other complaints.  She was in A. fib with RVR. Cardiology was consult and no further workup was recommended. Her diuretics were adjusted and currently she is on 40 mg daily of Lasix. Weight is stable compared to her hospital discharge. Blood pressure is well-controlled today. She appears essentially wheelchair-bound. She is in a nursing home.  PMHx:  Past Medical History  Diagnosis Date  . Asthma   . Bronchitis   . Hernia   . Anemia   . 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 Morbid obesity ([place])   . Sarcoid ([place])   . Dyslipidemia   . Cough   . Wheezing   . Chills   . Constipation   . Bruises easily   . Gout attack     ankle, then wrist and hands  . Hypertension   . Atrial fibrillation, persistent ([place]) 11/01/2013  . Chronic anticoagulation, on Eliquis 11/01/2013  . History of nuclear stress test 02/09/2010    dipyridamole; normal pattern of perfusion in all regions with attenuation artifact in inferior region, low risk   . Fracture of left lower leg 06/1972    "no surgery; just casted it"  . CHF (congestive heart failure) ([place])   . Pneumonia 2012; 2014  . Chronic bronchitis ([place])   . OSA on CPAP   . Type II diabetes mellitus ([place])   . Uterine cancer [facility])     s/p hysterectomy  . Breast cancer, stage 1 ([place]) 10/17/2011    s/p right lumpectomy and XRT  . On home oxygen therapy     "2L  prn" (01/24/2014)    Past Surgical History  Procedure Laterality Date  . Total knee arthroplasty Bilateral 03/2010; 06/2011    left; right  . Abdominal hysterectomy  02/04/2005  . Umbilical hernia repair  02/04/2005  . Transthoracic echocardiogram  07/2008    EF, LV size is normal; RVSP normal; mod calcif of MV apparatus  . Breast biopsy Right 01/2011  . Breast lumpectomy Right 01/2011  . Tubal ligation  1970's  . Hernia repair      FAMHx:  Family History  Problem Relation Age of Onset  . Diabetes Mother   . Colon cancer Father   . Diabetes Brother   . Lung cancer Brother   . Diabetes Sister   . Cervical cancer Sister      SOCHx:   reports that she has never smoked. She has never used smokeless tobacco. She reports that she does not drink alcohol or use illicit drugs.  ALLERGIES:  Allergies  Allergen Reactions  . Aspirin Other (See Comments)    Avoids due to being on blood thinners  . Ibuprofen Other (See Comments)    Avoids due to being on blood thinners  . Contrast Media [Iodinated Diagnostic Agents] Nausea And Vomiting  . Pravachol Other (See Comments)    Muscle pain    ROS: A comprehensive review of systems was negative.  HOME MEDS: Current Outpatient Prescriptions  Medication Sig Dispense Refill  . acetaminophen (TYLENOL) 500 MG tablet Take 1,000 mg by mouth every 6 (six) hours as needed. pain    . albuterol (PROVENTIL HFA;VENTOLIN HFA) 108 (90 BASE) MCG/ACT inhaler Inhale 2 puffs into the lungs 4 (four) times daily as needed for wheezing or shortness of breath.    Marland Kitchen apixaban (ELIQUIS) 5 MG TABS tablet TAKE 1 TABLET (5 MG TOTAL) BY MOUTH 2 (TWO) TIMES DAILY. 60 tablet 5  . Calcium Carbonate-Vitamin D (OYSTER-CAL 500 + D PO) Take 1 tablet by mouth 2 (two) times daily.    . cephALEXin (KEFLEX) 500 MG capsule Take 1 capsule (500 mg total) by mouth 3 (three) times daily. X 4 days 12 capsule 0  . colchicine 0.6 MG tablet Take 1 tablet (0.6 mg total) by mouth daily as needed (gout flare). 60 tablet 0  . diltiazem (CARDIZEM CD) 240 MG 24 hr capsule Take 240 mg by mouth every morning.     Mariane Baumgarten Calcium (STOOL SOFTENER PO) Take 1 capsule by mouth daily as needed (FOR CONSTIPATION).     Marland Kitchen donepezil (ARICEPT) 10 MG tablet Take 10 mg by mouth at bedtime.    Marland Kitchen exemestane (AROMASIN) 25 MG tablet Take 25 mg by mouth daily after breakfast.    . furosemide (LASIX) 80 MG tablet Take 0.5 tablets (40 mg total) by mouth daily.    Marland Kitchen glipiZIDE (GLUCOTROL) 10 MG tablet Take 10 mg by mouth daily before breakfast.    . ketoconazole (NIZORAL) 2 % cream Apply 1 application topically daily as needed for irritation.      . metFORMIN (GLUCOPHAGE) 500 MG tablet Take 500 mg by mouth 2 (two) times daily with a meal.     . metoCLOPramide (REGLAN) 5 MG tablet Take 5 mg by mouth 2 (two) times daily.    Marland Kitchen nystatin cream (MYCOSTATIN) Apply 1 application topically 2 (two) times daily.    . ondansetron (ZOFRAN) 8 MG tablet Take 8 mg by mouth every 8 (eight) hours as needed for nausea or vomiting.    Marland Kitchen OVER THE COUNTER MEDICATION Inhale 1  application into the lungs at bedtime. CPAP    . potassium chloride SA (K-DUR,KLOR-CON) 20 MEQ tablet Take 1 tablet (20 mEq total) by mouth daily.    . SYMBICORT 160-4.5 MCG/ACT inhaler Inhale 2 puffs into the lungs 2 (two) times daily. Rinse mouth well after use    . traZODone (DESYREL) 100 MG tablet Take 100 mg by mouth at bedtime.     . vitamin B-12 (CYANOCOBALAMIN) 1000 MCG tablet Take 1,000 mcg by mouth daily.     No current facility-administered medications for this visit.    LABS/IMAGING: No results found for this or any previous visit (from the past 48 hour(s)). No results found.  VITALS: BP 106/62 mmHg  Pulse 70  Ht 5' (1.524 m)  Wt 254 lb (115.214 kg)  BMI 49.61 kg/m2  EXAM: General appearance: alert, mild distress, morbidly obese and shallow breathing Neck: JVD - 3 cm above sternal notch and no carotid bruit Lungs: diminished breath sounds bibasilar and bilaterally Heart: irregularly irregular rhythm Abdomen: soft, non-tender; bowel sounds normal; no masses,  no organomegaly and obese, protuberant with a fluid wave Extremities: compression stockings in place, trace to 1+ edema Pulses: 2+ and symmetric Skin: Skin color, texture, turgor normal. No rashes or lesions Neurologic: Grossly normal Psych: Mild respiratory distress  EKG: deferred  ASSESSMENT: 1. Chronic diastolic congestive heart failure, NYHA Class III symptoms 2. Persistent atrial fibrillation on Eliquis 3. Hypertension 4. Diabetes type 2 - on insulin 5. Morbid obesity 6. Obstructive sleep  apnea 7. Sarcoidosis 8. Dyslipidemia  PLAN: 1.   Mrs. Schmidtke seems to be stable after recent diuresis. No further chest pain workup was undertaken. 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 She seems to be tolerating eloquence and her A. fib rate is controlled. Plan to continue her current medicines and we'll see her back in 6 months.  Pixie Casino, MD, [facility] Attending Cardiologist [place] C Hilty 10/28/2015, 6:56 PM

## 2015-11-01 NOTE — Progress Notes (Signed)
Patient ID: [name], female   DOB: 03-Jun-1941, 74 y.o.   MRN: [id number]                PROGRESS NOTE  DATE:  10/27/2015         FACILITY: [facility]                     LEVEL OF CARE:   SNF   Acute Visit    CHIEF COMPLAINT:  Follow up respiratory status.    HISTORY OF PRESENT ILLNESS:  I admitted this patient to the building at the beginning of the month after a stay at [facility] from 10/05/2015 through 10/09/2015.    She was really wheezing and working to breathe when she first came in.  I put her on routine nebulizers.  She has a complicated respiratory status including COPD, sarcoidosis, and obstructive sleep apnea.  She wears CPAP at night and may actually have obesity hypoventilation.  She claims to be compliant.      PAST MEDICAL HISTORY/PROBLEM LIST:   Her past medical history is notable for the fact that she is also listed as having asthma.  She is on chronic oxygen at 2 L.     Past Medical History  Diagnosis Date  . Asthma   . Bronchitis   . Hernia   . Anemia   . Sarcoidosis ([place])   . Morbid obesity ([place])   . Sarcoid ([place])   . Dyslipidemia   . Cough   . Wheezing   . Chills   . Constipation   . Bruises easily   . Gout attack     ankle, then wrist and hands  . Hypertension   . Atrial fibrillation, persistent ([place]) 11/01/2013  . Chronic anticoagulation, on Eliquis 11/01/2013  . History of nuclear stress test 02/09/2010    dipyridamole; normal pattern of perfusion in all regions with attenuation artifact in inferior region, low risk   . Fracture of left lower leg 06/1972    "no surgery; just casted it"  . CHF (congestive heart failure) ([place])   . Pneumonia 2012; 2014  . Chronic bronchitis ([place])   . OSA on CPAP   . Type II diabetes mellitus ([place])   . Uterine cancer [facility])     s/p hysterectomy  . Breast cancer, stage 1 ([place]) 10/17/2011    s/p right lumpectomy and XRT  . On home oxygen therapy     "2L prn" (01/24/2014)     CURRENT MEDICATIONS:   Medication list is reviewed.          Os-Cal plus D 1 p.o. b.i.d.      K-Dur 40 mEq daily.    Symbicort 160/4.5, 2 puffs b.i.d.     Eliquis 5 b.i.d.      Colchicine 0.6 b.i.d.      Cardizem CD 240 q.d.      Aricept 10 q.h.s.      Aromasin 25 mg daily.    Glucotrol 10 mg before breakfast.    Cozaar 50 q.d.      Reglan 500 b.i.d.      Glucophage 500 b.i.d.      Trazodone 100 at h.s.      Vitamin B12, 1000 daily.    Lasix 40 mg daily.     *She came here and finished Keflex 500 t.i.d.      REVIEW OF SYSTEMS:    GENERAL:  The patient states she is comfortable.   HEENT:  No swallowing difficulties.   CHEST/RESPIRATORY:  Baseline shortness of breath.  No coughing.  No sputum.    CARDIAC:  No chest pain.   EDEMA/VARICOSITIES:  Extremities:  States she has not noticed any increase in edema.   GI:  No abdominal pain.   No nausea or vomiting.    GU:  No dysuria.     PHYSICAL EXAMINATION:   VITAL SIGNS:     TEMPERATURE:  97.8.    PULSE:  60.    RESPIRATIONS:  20.     BLOOD PRESSURE:  105/60.     02 SATURATIONS:  96% on 2 L.     GENERAL APPEARANCE:  The patient is not in any distress.        CHEST/RESPIRATORY:  Shallow, but otherwise clear air entry bilaterally.    CARDIOVASCULAR:   CARDIAC:  Heart sounds are normal.  I see no evidence of heart failure here.    EDEMA/VARICOSITIES:  Extremities:  She has significant venous stasis, but no obvious edema.   There is no coccyx edema.       GASTROINTESTINAL:   ABDOMEN:  Soft, nontender.     LIVER/SPLEEN/KIDNEYS:  No liver, no spleen.   GENITOURINARY:   BLADDER:  No bladder distention or CVA tenderness.     ASSESSMENT/PLAN:                COPD/sarcoid/?asthma.  She seems to have done better on the albuterol nebulizers.  She is also on a LABA and her Symbicort.  I am going to change the nebulizers to p.r.n.    Atrial fibrillation, with a history of congestive heart failure.  Her heart rate is controlled.  I see no  evidence of heart failure at the bedside.  She is on Lasix at 40 mg a day.  I will recheck her electrolytes.     Sleep apnea/obesity hypoventilation syndrome.   I adjusted her CPAP in accordance with Dr. Janee Morn office notes to 9 cm of water.     CPT CODE: 32440

## 2015-11-10 ENCOUNTER — Non-Acute Institutional Stay (SKILLED_NURSING_FACILITY): Payer: Medicare Other | Admitting: Internal Medicine

## 2015-11-10 DIAGNOSIS — J441 Chronic obstructive pulmonary disease with (acute) exacerbation: Secondary | ICD-10-CM | POA: Diagnosis not present

## 2015-11-10 DIAGNOSIS — J189 Pneumonia, unspecified organism: Secondary | ICD-10-CM | POA: Diagnosis not present

## 2015-11-14 NOTE — Progress Notes (Addendum)
Patient ID: [name], female   DOB: 1941/05/30, 74 y.o.   MRN: [id number]                PROGRESS NOTE  DATE:  11/10/2015         FACILITY: [facility]                     LEVEL OF CARE:   SNF   Acute Visit              CHIEF COMPLAINT:  Follow up right lower lobe pneumonia.      HISTORY OF PRESENT ILLNESS:  Over the holiday weekend, the patient apparently developed a cough.  She had a chest x-ray done on 11/04/2015 that showed right lower lobe airspace disease.  She was given Rocephin once and then Avelox daily for a 10-day course.  She was also given Florastor.    She has a background of significant respiratory issues including COPD, sarcoidosis.  She also has diastolic heart failure with a recent echocardiogram showing an EF that was normal.   She is on chronic oxygen.    Past Medical History  Diagnosis Date  . Asthma   . Bronchitis   . Hernia   . Anemia   . Sarcoidosis ([place])   . Morbid obesity ([place])   . Sarcoid ([place])   . Dyslipidemia   . Cough   . Wheezing   . Chills   . Constipation   . Bruises easily   . Gout attack     ankle, then wrist and hands  . Hypertension   . Atrial fibrillation, persistent ([place]) 11/01/2013  . Chronic anticoagulation, on Eliquis 11/01/2013  . History of nuclear stress test 02/09/2010    dipyridamole; normal pattern of perfusion in all regions with attenuation artifact in inferior region, low risk   . Fracture of left lower leg 06/1972    "no surgery; just casted it"  . CHF (congestive heart failure) ([place])   . Pneumonia 2012; 2014  . Chronic bronchitis ([place])   . OSA on CPAP   . Type II diabetes mellitus ([place])   . Uterine cancer [facility])     s/p hysterectomy  . Breast cancer, stage 1 ([place]) 10/17/2011    s/p right lumpectomy and XRT  . On home oxygen therapy     "2L prn" (01/24/2014)    CURRENT MEDICATIONS:  Medication list is reviewed.            Os-Cal plus D 1 p.o. b.i.d.      K-Dur 40 mEq daily.     Symbicort 160/4.5, 2  puffs b.i.d.      Eliquis 5 b.i.d.     Colchicine 0.6 b.i.d.      Cardizem CD 240 q.d.      Aricept 10 q.h.s.      Aromasin 25 mg daily.     Glucotrol 10 mg before breakfast.    Cozaar 50 q.d.      Reglan 500 b.i.d.      Glucophage 500 b.i.d.      Trazodone 100 at h.s.      Vitamin B12, 1000 daily.      Lasix 40 q.d.       REVIEW OF SYSTEMS:    GENERAL:  The patient states she is comfortable.    HEENT:   No swallowing difficulties.    CHEST/RESPIRATORY:  She is at baseline shortness of breath.  She states she has a dry, nonproductive  cough.  No chest pain.   CARDIAC:  No exertional chest pain.   EDEMA/VARICOSITIES:  States she has not noted any increasing edema.     GI:  No abdominal pain.   No nausea, vomiting, or diarrhea.          GU:  No dysuria.    NEUROLOGICAL:  She is complaining of some dizziness.      PHYSICAL EXAMINATION:   VITAL SIGNS:     TEMPERATURE:  98.2.     PULSE:  64.     RESPIRATIONS:  19.    BLOOD PRESSURE:  128/76.    02 SATURATIONS:  95%.     CHEST/RESPIRATORY:  Indeed, there are crackles in the right lower lobe and also the right middle lobe.  The left lung is clear.  There is no wheezing.  No accessory muscle use.      CARDIOVASCULAR:   CARDIAC:  Heart sounds are irregular, compatible with known atrial fibrillation.  However, her pulse rate is controlled.  There are no murmurs.  Her JVP is not elevated.  She has no coccyx edema.    GASTROINTESTINAL:   ABDOMEN:  No masses.      LIVER/SPLEEN/KIDNEYS:  No liver, no spleen.  No tenderness.      CIRCULATION:   EDEMA/VARICOSITIES:  Extremities:  Venous insufficiency, but no edema.  There is no evidence of a DVT.    ASSESSMENT/PLAN:               Right lower lobe pneumonia; perhaps right middle lobe, as well, which would not have shown up on a portable x-ray.  She is on Avelox and appears to be doing amazingly well given her underlying chronic respiratory disease/failure.    Complaints of  dizziness.  This is not a new complaint, although I note that Avelox can do this sort of thing.  I am going to limit the Avelox to a seven-day course, which should complete tomorrow.    COPD.  On chronic oxygen.  Underlying sarcoidosis.  Miraculously, this actually appears stable.    I have reviewed her last chest x-ray at the end of October at [facility].  She did not have a right lower lobe infiltrate.  I will consider repeating her chest x-ray in a month's time to assure resolution of the current xray findings. The patient has dose allmost too well gvein her underlying lung disease for this degree of a bacterial pneumonia.

## 2015-12-08 ENCOUNTER — Non-Acute Institutional Stay (SKILLED_NURSING_FACILITY): Payer: Medicare Other | Admitting: Internal Medicine

## 2015-12-08 DIAGNOSIS — D869 Sarcoidosis, unspecified: Secondary | ICD-10-CM | POA: Diagnosis not present

## 2015-12-08 DIAGNOSIS — J449 Chronic obstructive pulmonary disease, unspecified: Secondary | ICD-10-CM | POA: Diagnosis not present

## 2015-12-08 DIAGNOSIS — R634 Abnormal weight loss: Secondary | ICD-10-CM | POA: Diagnosis not present

## 2015-12-08 DIAGNOSIS — J9621 Acute and chronic respiratory failure with hypoxia: Secondary | ICD-10-CM | POA: Diagnosis not present

## 2015-12-08 NOTE — Progress Notes (Signed)
Patient ID: [name], female   DOB: November 13, 1941, 74 y.o.   MRN: [id number] Facility; [facility] Chief complaint declining status; memory concerns, declining physical statusraised by the staff History; [name] is a lady who was admitted here in November. She has multifaceted respiratory issues including sarcoidosis, obesity hypoventilation/obstructive sleep apnea,COPD. She is on chronic oxygen. She was treated earlier in November for right lower lobe pneumonia although when I saw her she did almost remarkably well which may be somewhat concerned that this actually wasn't a bacterial pneumonitis. Review of previous chest x-raysdid not show any evidence of a right lower lobe infiltrate.  There've been numerous concerns raised by her family about declining cognition.I spoke to one of her sons 2 weeks agowho told me that she is making repetitive phone calls etc. I said I would review this and I actually had her on my list of people to see. She does not carry a diagnosis of cognitive issues.Apparently they have been quite insistent on sending her to a neurologist and were angry enough to demand and [place] to to send her to another facility and I was made aware of this today by the facility administrator and social worker.  Further I perceived notification from the therapist through the ursing staff that she is no longer is functional She apparently used to be able to stand and pivot transfer  Now she is a maximum assist of 2 people. She apparently had a fall transferring yesterday  The patient really doesn't to me anything. She denies fever or cough shortness of breath. She does admit to some dysuria. She does not feel excessively weak. She has no complaints of pain  Past Medical History  Diagnosis Date  . Asthma   . Bronchitis   . Hernia   . Anemia   . Sarcoidosis ([place])   . Morbid obesity ([place])   . Sarcoid ([place])   . Dyslipidemia   . Cough   . Wheezing   . Chills   . Constipation   .  Bruises easily   . Gout attack     ankle, then wrist and hands  . Hypertension   . Atrial fibrillation, persistent ([place]) 11/01/2013  . Chronic anticoagulation, on Eliquis 11/01/2013  . History of nuclear stress test 02/09/2010    dipyridamole; normal pattern of perfusion in all regions with attenuation artifact in inferior region, low risk   . Fracture of left lower leg 06/1972    "no surgery; just casted it"  . CHF (congestive heart failure) ([place])   . Pneumonia 2012; 2014  . Chronic bronchitis ([place])   . OSA on CPAP   . Type II diabetes mellitus ([place])   . Uterine cancer [facility])     s/p hysterectomy  . Breast cancer, stage 1 ([place]) 10/17/2011    s/p right lumpectomy and XRT  . On home oxygen therapy     "2L prn" (01/24/2014)    Past Surgical History  Procedure Laterality Date  . Total knee arthroplasty Bilateral 03/2010; 06/2011    left; right  . Abdominal hysterectomy  02/04/2005  . Umbilical hernia repair  02/04/2005  . Transthoracic echocardiogram  07/2008    EF, LV size is normal; RVSP normal; mod calcif of MV apparatus  . Breast biopsy Right 01/2011  . Breast lumpectomy Right 01/2011  . Tubal ligation  1970's  . Hernia repair      current medications cardizem CD 240 daily Aromasin 25 daily Cozaar 50 dailyK Dur 40 mEq daily  Vitamin B12 thousand dailyLasix 40 daily Eliquis 5 mg twice a day Colchicine 0.6Metformin 500 twice a day Calcium 500/200 twice a day Symbicort 160/4.5 puffs twice a day Aricept 10 mgdaily at bedtime Desyrel 100 mg at bedtime Glipizide was discontinued earlier this month due to hypoglycemia  Review of systems HEENT; I have received concerns about eye drainage Respiratory states her breathing is at baseline no wheezing no cough Cardiac;no complaints of chest pain [place] abdominal pain nausea vomiting or diarrhea. Staff reports she is not eating well GU patient states mild dysuria Musculoskeletal she is not complaining of pain Neurologic; denies focal  weakness or numbness Mental status; does not complain of depression  endocrine; blood sugar this morning was 97 before meals lunch at 115 Although I don't see many legible blood sugars in the [facility] it does not appear that this is an issue Skin I spoke with the wound care team there is no issues here.   Physical examination Gen.the patient looks a bit listless but wakes up and converses with me clearly Vitals; O2 sat is 97% on 2respirations 20 and unlabored pulse rate 86 HEENT dry mucous memb no lesions seen.she does appear to have some form of conjunctivitis bilaterally Lymph nonpalpable al clavicular or aary areas Breasts; exam deferred as she said she had a mammogram 1 year ago Espiratory;decreased air entry in the left lower lobe Cardiacheart sounds are irregular compatible with her known A. Fib. There may be some dehydration here. Abdomen; obese. She has a ventral abdominal hernia above the umbilicus although this is not appear to be tender or problematic no liver or spleen is palpable GU no suprapubic or costovertebral angle tenderness or fullness Extremities she does not have any edema no evidence of a DVT Neurologic; no pronator drift, he is weak at her bilateral hip flexors 3+ out of 5and abductors 4 out of 5 reflexes 1+ at the kneks both toes are downgoing. There is no evidence of a sensory level. Mental status; she is orientated to the year month place [place] the dateas December 26. She did name Lala Lund [place]. Said she has been here 6 months and that she came here imarch. She can name the president but not the vice presidentalthough when I told her who it wed able to  Recall it. Remembered 3 out of 4 words at 5 minutes. As mentioned she seems listless but not particularly depressed  Impression/plan #1 multifaceted chronic respiratory failure hypoxia This does not seem to be unstable. She needs a follow-up chest x-ray to look at the right lower lobe infiltrate  If that is  still present she may need a CT scan #2 when I first came in the room I had some concerns about underlying delirium however she does fairly well on mental status testing perhaps not perfect. She will need comprehensive metabolic panel CBC urinalysis and culture. #3 type 2 diabetes on oral agents; her Glucotrol was stopped earlier this month and she remains on metformin #4 atrial fibrillation heart rate is controlled on Eliquis This does not appear to be unstable #5 on Reglanhowever she has no evidence of EPS. #6/ Dementia question mild cognitive impairment.She is on AriceptI did not start this.We will complete a Folstein Mini-Mental Status in addition to what I did above. Any degree of cognitive impairmentis mild #7 it would appear that she is lost 17 pounds in the last 6 weeks; I have come across this by looking at point in clinic care.Current weight is 240 pounds,  257 pounds on 11/16 and even higher than that when she came in the facility at the end of October  n spite of a multitude of complaints, I can find very little at the bedside. She certainly needs lab work urine CNS. As she has some evidence of dehydrationI'm going to put the Lasix and potassium on hold.She has some complaints of dysuria and I'm going to get a urine and give her emperically antibiotics while we wait return of the culture.

## 2015-12-11 ENCOUNTER — Non-Acute Institutional Stay (SKILLED_NURSING_FACILITY): Payer: Medicare Other | Admitting: Internal Medicine

## 2015-12-11 DIAGNOSIS — J189 Pneumonia, unspecified organism: Secondary | ICD-10-CM | POA: Diagnosis not present

## 2015-12-11 DIAGNOSIS — N390 Urinary tract infection, site not specified: Secondary | ICD-10-CM | POA: Diagnosis not present

## 2015-12-11 DIAGNOSIS — I5032 Chronic diastolic (congestive) heart failure: Secondary | ICD-10-CM

## 2015-12-11 DIAGNOSIS — I4819 Other persistent atrial fibrillation: Secondary | ICD-10-CM

## 2015-12-11 DIAGNOSIS — Z794 Long term (current) use of insulin: Secondary | ICD-10-CM

## 2015-12-11 DIAGNOSIS — I481 Persistent atrial fibrillation: Secondary | ICD-10-CM

## 2015-12-11 DIAGNOSIS — E46 Unspecified protein-calorie malnutrition: Secondary | ICD-10-CM | POA: Diagnosis not present

## 2015-12-11 DIAGNOSIS — E118 Type 2 diabetes mellitus with unspecified complications: Secondary | ICD-10-CM

## 2015-12-11 DIAGNOSIS — F0390 Unspecified dementia without behavioral disturbance: Secondary | ICD-10-CM

## 2015-12-11 DIAGNOSIS — F329 Major depressive disorder, single episode, unspecified: Secondary | ICD-10-CM

## 2015-12-11 DIAGNOSIS — C50311 Malignant neoplasm of lower-inner quadrant of right female breast: Secondary | ICD-10-CM | POA: Diagnosis not present

## 2015-12-11 DIAGNOSIS — R531 Weakness: Secondary | ICD-10-CM | POA: Diagnosis not present

## 2015-12-11 DIAGNOSIS — J449 Chronic obstructive pulmonary disease, unspecified: Secondary | ICD-10-CM | POA: Diagnosis not present

## 2015-12-11 DIAGNOSIS — I10 Essential (primary) hypertension: Secondary | ICD-10-CM

## 2015-12-13 NOTE — Progress Notes (Signed)
Patient ID: [name], female   DOB: June 20, 1941, 75 y.o.   MRN: [id number]     [facility]   PCP: Shirline Frees, MD  Code Status: full code  Allergies  Allergen Reactions  . Aspirin Other (See Comments)    Avoids due to being on blood thinners  . Ibuprofen Other (See Comments)    Avoids due to being on blood thinners  . Contrast Media [Iodinated Diagnostic Agents] Nausea And Vomiting  . Pravachol Other (See Comments)    Muscle pain    Chief Complaint  Patient presents with  . New Admit To SNF     HPI:  75 y.o. patient is here for long term care. She was residing in another SNF prior to this. She has PMH of CHF, dementia, sarcoidosis, HTN, COPD, Afib, DM, gait abnormality among others. She is currently on antibiotics for UTI and PNA starting 12/10/15. She is alert and oriented and denies any concern this am.  Review of Systems:  Constitutional: Negative for fever, chills.  HENT: Negative for headache, congestion, nasal discharge   Eyes: Negative for double vision and discharge.  Respiratory: Positive for cough. Negative for shortness of breath and wheezing.   Cardiovascular: Negative for chest pain, palpitations, leg swelling.  Gastrointestinal: Negative for heartburn, nausea, vomiting, abdominal pain Genitourinary: Negative for dysuria and flank pain.  Musculoskeletal: Negative for back pain, falls Skin: Negative for rash.  Neurological: Negative for dizziness Psychiatric/Behavioral: Negative for depression.    Past Medical History  Diagnosis Date  . Asthma   . Bronchitis   . Hernia   . Anemia   . Sarcoidosis ([place])   . Morbid obesity ([place])   . Sarcoid ([place])   . Dyslipidemia   . Cough   . Wheezing   . Chills   . Constipation   . Bruises easily   . Gout attack     ankle, then wrist and hands  . Hypertension   . Atrial fibrillation, persistent ([place]) 11/01/2013  . Chronic anticoagulation, on Eliquis 11/01/2013  .  History of nuclear stress test 02/09/2010    dipyridamole; normal pattern of perfusion in all regions with attenuation artifact in inferior region, low risk   . Fracture of left lower leg 06/1972    "no surgery; just casted it"  . CHF (congestive heart failure) ([place])   . Pneumonia 2012; 2014  . Chronic bronchitis ([place])   . OSA on CPAP   . Type II diabetes mellitus ([place])   . Uterine cancer [facility])     s/p hysterectomy  . Breast cancer, stage 1 ([place]) 10/17/2011    s/p right lumpectomy and XRT  . On home oxygen therapy     "2L prn" (01/24/2014)   Past Surgical History  Procedure Laterality Date  . Total knee arthroplasty Bilateral 03/2010; 06/2011    left; right  . Abdominal hysterectomy  02/04/2005  . Umbilical hernia repair  02/04/2005  . Transthoracic echocardiogram  07/2008    EF, LV size is normal; RVSP normal; mod calcif of MV apparatus  . Breast biopsy Right 01/2011  . Breast lumpectomy Right 01/2011  . Tubal ligation  1970's  . Hernia repair     Social History:   reports that she has never smoked. She has never used smokeless tobacco. She reports that she does not drink alcohol or use illicit drugs.  Family History  Problem Relation Age of Onset  . Diabetes Mother   . Colon cancer Father   .  Diabetes Brother   . Lung cancer Brother   . Diabetes Sister   . Cervical cancer Sister     Medications:   Medication List       This list is accurate as of: 12/11/15 11:59 PM.  Always use your most recent med list.               acetaminophen 500 MG tablet  Commonly known as:  TYLENOL  Take 1,000 mg by mouth every 6 (six) hours as needed. pain     albuterol 108 (90 Base) MCG/ACT inhaler  Commonly known as:  PROVENTIL HFA;VENTOLIN HFA  Inhale 2 puffs into the lungs 4 (four) times daily as needed for wheezing or shortness of breath.     amoxicillin-clavulanate 875-125 MG tablet  Commonly known as:  AUGMENTIN  Take 1 tablet by mouth 2 (two) times daily.     apixaban 5 MG Tabs  tablet  Commonly known as:  ELIQUIS  TAKE 1 TABLET (5 MG TOTAL) BY MOUTH 2 (TWO) TIMES DAILY.     ciprofloxacin 500 MG tablet  Commonly known as:  CIPRO  Take 500 mg by mouth 2 (two) times daily.     colchicine 0.6 MG tablet  Take 1 tablet (0.6 mg total) by mouth daily as needed (gout flare).     diltiazem 240 MG 24 hr capsule  Commonly known as:  CARDIZEM CD  Take 240 mg by mouth every morning.     donepezil 10 MG tablet  Commonly known as:  ARICEPT  Take 10 mg by mouth at bedtime.     exemestane 25 MG tablet  Commonly known as:  AROMASIN  Take 25 mg by mouth daily after breakfast.     furosemide 80 MG tablet  Commonly known as:  LASIX  Take 0.5 tablets (40 mg total) by mouth daily.     glipiZIDE 10 MG tablet  Commonly known as:  GLUCOTROL  Take 10 mg by mouth daily before breakfast.     losartan 50 MG tablet  Commonly known as:  COZAAR  Take 50 mg by mouth daily.     meclizine 25 MG tablet  Commonly known as:  ANTIVERT  Take 25 mg by mouth 3 (three) times daily as needed for dizziness.     metFORMIN 500 MG tablet  Commonly known as:  GLUCOPHAGE  Take 500 mg by mouth 2 (two) times daily with a meal.     metoCLOPramide 5 MG tablet  Commonly known as:  REGLAN  Take 5 mg by mouth 2 (two) times daily.     mirtazapine 15 MG disintegrating tablet  Commonly known as:  REMERON SOL-TAB  Take 15 mg by mouth at bedtime.     ondansetron 8 MG tablet  Commonly known as:  ZOFRAN  Take 8 mg by mouth every 8 (eight) hours as needed for nausea or vomiting.     OYSTER-CAL 500 + D PO  Take 1 tablet by mouth 2 (two) times daily.     STOOL SOFTENER PO  Take 1 capsule by mouth daily as needed (FOR CONSTIPATION).     SYMBICORT 160-4.5 MCG/ACT inhaler  Generic drug:  budesonide-formoterol  Inhale 2 puffs into the lungs 2 (two) times daily. Rinse mouth well after use     traZODone 100 MG tablet  Commonly known as:  DESYREL  Take 50 mg by mouth at bedtime.     vitamin B-12  1000 MCG tablet  Commonly known as:  CYANOCOBALAMIN  Take 1,000 mcg by mouth daily.         Physical Exam: Filed Vitals:   12/11/15 1542  BP: 102/62  Pulse: 82  Temp: 97 F (36.1 C)  Resp: 18  SpO2: 98%    General- elderly female, obese, in no acute distress Head- normocephalic, atraumatic Nose- normal nasal mucosa, no maxillary or frontal sinus tenderness, no nasal discharge Throat- moist mucus membrane  Eyes- PERRLA, EOMI, no pallor, no icterus, no discharge, normal conjunctiva, normal sclera Neck- no cervical lymphadenopathy Cardiovascular- irregular heart rate, no murmurs, no leg edema Respiratory- bilateral decreased air entry, no wheeze, no rhonchi, no crackles, no use of accessory muscles Abdomen- bowel sounds present, soft, non tender Musculoskeletal- able to move all 4 extremities, generalized weakness more to lower extremities Neurological- no focal deficit, alert and oriented to person, place and time Skin- warm and dry, intact skin Psychiatry- normal mood and affect    Labs reviewed: Basic Metabolic Panel:  Recent Labs  10/06/15 1025 10/08/15 0409 10/09/15 0541  NA 141 136 138  K 4.2 3.4* 3.9  CL 101 98* 97*  CO2 34* 32 31  GLUCOSE 130* 139* 176*  BUN 8 6 6   CREATININE 0.97 0.77 0.78  CALCIUM 8.6* 8.2* 8.7*   Liver Function Tests:  Recent Labs  10/05/15 2120 10/06/15 1025  AST 19 12*  ALT 6* 9*  ALKPHOS 61 59  BILITOT 1.2 0.8  PROT 6.0* 5.6*  ALBUMIN 3.0* 2.7*   No results for input(s): LIPASE, AMYLASE in the last 8760 hours. No results for input(s): AMMONIA in the last 8760 hours. CBC:  Recent Labs  10/05/15 2120 10/06/15 1025 10/08/15 0409 10/09/15 0541  WBC  --  8.1 8.7 11.1*  NEUTROABS 7.6 5.8  --   --   HGB  --  11.1* 10.9* 11.4*  HCT  --  34.8* 34.4* 34.7*  MCV  --  84.1 83.9 83.4  PLT  --  214 216 220   Cardiac Enzymes:  Recent Labs  10/05/15 2120 10/06/15 0330 10/06/15 1025  TROPONINI <0.03 <0.03 <0.03    BNP: Invalid input(s): POCBNP CBG:  Recent Labs  10/09/15 0729 10/09/15 1104 10/09/15 1613  GLUCAP 192* 277* 235*    Radiological Exams: Dg Chest Portable 1 View  10/05/2015  CLINICAL DATA:  Generalized chest pain EXAM: PORTABLE CHEST 1 VIEW COMPARISON:  03/19/2015 FINDINGS: Cardiomediastinal silhouette is stable. No acute infiltrate or pleural effusion. No pulmonary edema. No pneumothorax. IMPRESSION: No active disease. Electronically Signed   By: Lahoma Crocker M.D.   On: 10/05/2015 18:24     Assessment/Plan  Generalized weakness Will have her work with physical therapy and occupational therapy team to help with gait training and muscle strengthening exercises.fall precautions. Skin care. Encourage to be out of bed.   CAP Continue and complete course of augmentin, monitor breathing, wbc and temp curve  UTI Denies symptom, hydration to be maintained. Continue and complete course of ciprofloxacin on 12/14/15  Protein calorie malnutrition Will need to monitor her po intake. Currently on remeron. Monitor weight twice a week. Will have dietary team see her  afib Rate controlled. Continue cardizem 240 mg daily for rate control. Continue eliquis for anticoagulation  Chronic diastolic chf Monitor weight. Continue losartan and lasix. Not on kcl supplement. Check bmp  HTN Monitor bp, continue losartan   Copd Breathing stable, continue symbicort and prn albuterol  Dementia Continue aricept, to provide assistance with ADLs  DM Check a1c, monitor cbg, continue metformin and  glipizide  Depression Mood stable, continue trazodone and remeron and monitor mood  History of breast cancer Continue exemestane for now   Goals of care: long term care   Labs/tests ordered: cbc, cmp, a1c  Family/ staff Communication: reviewed care plan with patient and nursing supervisor    Blanchie Serve, MD  [place] [phone] (Monday-Friday 8 am - 5 pm) [phone]  (afterhours)

## 2015-12-15 LAB — HEMOGLOBIN A1C: HEMOGLOBIN A1C: 5.4

## 2015-12-15 LAB — BASIC METABOLIC PANEL
BUN: 21 mg/dL (ref 4–21)
CREATININE: 0.9 mg/dL (ref 0.5–1.1)
Glucose: 66 mg/dL
POTASSIUM: 3.9 mmol/L (ref 3.4–5.3)
Sodium: 143 mmol/L (ref 137–147)

## 2015-12-15 LAB — CBC AND DIFFERENTIAL
HCT: 39 % (ref 36–46)
HEMOGLOBIN: 11.5 g/dL — AB (ref 12.0–16.0)
Neutrophils Absolute: 5 /uL
Platelets: 215 10*3/uL (ref 150–399)
WBC: 7.7 10^3/mL

## 2015-12-15 LAB — HEPATIC FUNCTION PANEL
ALK PHOS: 78 U/L (ref 25–125)
ALT: 14 U/L (ref 7–35)
AST: 14 U/L (ref 13–35)
Bilirubin, Total: 0.4 mg/dL

## 2016-01-05 ENCOUNTER — Non-Acute Institutional Stay (SKILLED_NURSING_FACILITY): Payer: Medicare Other | Admitting: Adult Health

## 2016-01-05 ENCOUNTER — Encounter: Payer: Self-pay | Admitting: Adult Health

## 2016-01-05 DIAGNOSIS — E46 Unspecified protein-calorie malnutrition: Secondary | ICD-10-CM

## 2016-01-05 DIAGNOSIS — M109 Gout, unspecified: Secondary | ICD-10-CM

## 2016-01-05 DIAGNOSIS — C50311 Malignant neoplasm of lower-inner quadrant of right female breast: Secondary | ICD-10-CM

## 2016-01-05 DIAGNOSIS — F0390 Unspecified dementia without behavioral disturbance: Secondary | ICD-10-CM | POA: Diagnosis not present

## 2016-01-05 DIAGNOSIS — F329 Major depressive disorder, single episode, unspecified: Secondary | ICD-10-CM | POA: Diagnosis not present

## 2016-01-05 DIAGNOSIS — I481 Persistent atrial fibrillation: Secondary | ICD-10-CM | POA: Diagnosis not present

## 2016-01-05 DIAGNOSIS — G4700 Insomnia, unspecified: Secondary | ICD-10-CM

## 2016-01-05 DIAGNOSIS — I10 Essential (primary) hypertension: Secondary | ICD-10-CM | POA: Diagnosis not present

## 2016-01-05 DIAGNOSIS — R531 Weakness: Secondary | ICD-10-CM | POA: Diagnosis not present

## 2016-01-05 DIAGNOSIS — E118 Type 2 diabetes mellitus with unspecified complications: Secondary | ICD-10-CM | POA: Diagnosis not present

## 2016-01-05 DIAGNOSIS — I4819 Other persistent atrial fibrillation: Secondary | ICD-10-CM

## 2016-01-05 DIAGNOSIS — J449 Chronic obstructive pulmonary disease, unspecified: Secondary | ICD-10-CM

## 2016-01-05 DIAGNOSIS — I5032 Chronic diastolic (congestive) heart failure: Secondary | ICD-10-CM | POA: Diagnosis not present

## 2016-01-05 DIAGNOSIS — Z794 Long term (current) use of insulin: Secondary | ICD-10-CM

## 2016-01-05 NOTE — Progress Notes (Signed)
Patient ID: [name], female   DOB: 07-Nov-1941, 75 y.o.   MRN: [id number]    DATE:  01/05/16  MRN:  [id number]  BIRTHDAY: [birth year]  Facility:  Nursing Home Location:  [place] Room Number: [id number]  LEVEL OF CARE:  SNF [phone])  Contact Information    Name Relation Home Work Mobile   Mancusi,[name] Son [phone]   Gomez,Anthony Son [phone]  [phone]       Code Status History    Date Active Date Inactive Code Status Order ID Comments User Context   10/05/2015  9:08 PM 10/09/2015 11:03 PM Full Code [id number]  Lavina Hamman, MD ED   04/17/2014  8:08 PM 04/23/2014  5:07 PM Full Code [id number]  Albertine Patricia, MD ED   01/24/2014  6:18 PM 01/30/2014  4:18 PM Full Code [id number]  Brett Canales, PA-C Inpatient   11/01/2013  1:03 AM 11/07/2013  6:49 PM Full Code [id number]  Louellen Molder, MD Inpatient   08/19/2013  1:52 AM 08/26/2013  8:31 PM Full Code [id number]  Phillips Grout, MD Inpatient   05/03/2013  5:51 PM 05/14/2013  7:10 PM Full Code [id number]  Venetia Maxon Rama, MD Inpatient       Chief Complaint  Patient presents with  . Medical Management of Chronic Issues    Generalized weakness, chronic diastolic CHF, atrial fibrillation, hypertension, COPD, gout, diabetes mellitus type 2, breast cancer, protein calorie malnutrition, insomnia, depression and dementia    HISTORY OF PRESENT ILLNESS:   This is a 75 year old female who is a long-term resident at [facility]. She is being seen for a routine visit. Metformin was recently discontinued due to low blood sugars. She is still taking Glucotrol 10 mg daily for diabetes mellitus type 2. Her blood pressure has been stable and currently taking Cozaar and diltiazem. She is still having BLE generalized weakness despite having short-term rehabilitation.  PAST MEDICAL HISTORY:  Past Medical History  Diagnosis Date  . Asthma   . Bronchitis   . Hernia   . Anemia   .  Sarcoidosis ([place])   . Morbid obesity ([place])   . Sarcoid ([place])   . Dyslipidemia   . Cough   . Wheezing   . Chills   . Constipation   . Bruises easily   . Gout attack     ankle, then wrist and hands  . Hypertension   . Atrial fibrillation, persistent ([place]) 11/01/2013  . Chronic anticoagulation, on Eliquis 11/01/2013  . History of nuclear stress test 02/09/2010    dipyridamole; normal pattern of perfusion in all regions with attenuation artifact in inferior region, low risk   . Fracture of left lower leg 06/1972    "no surgery; just casted it"  . CHF (congestive heart failure) ([place])   . Pneumonia 2012; 2014  . Chronic bronchitis ([place])   . OSA on CPAP   . Type II diabetes mellitus ([place])   . Uterine cancer [facility])     s/p hysterectomy  . Breast cancer, stage 1 ([place]) 10/17/2011    s/p right lumpectomy and XRT  . On home oxygen therapy     "2L prn" (01/24/2014)     CURRENT MEDICATIONS: Reviewed  Patient's Medications  New Prescriptions   No medications on file  Previous Medications   ACETAMINOPHEN (TYLENOL) 500 MG TABLET    Take 1,000 mg by mouth every 6 (six) hours as needed. pain   ALBUTEROL (PROVENTIL) (2.5  MG/3ML) 0.083% NEBULIZER SOLUTION    Take by nebulization every 6 (six) hours as needed for wheezing or shortness of breath.   APIXABAN (ELIQUIS) 5 MG TABS TABLET    TAKE 1 TABLET (5 MG TOTAL) BY MOUTH 2 (TWO) TIMES DAILY.   CALCIUM CARB-CHOLECALCIFEROL 500-200 MG-UNIT TABS    Take 1 tablet by mouth 2 (two) times daily.   COLCHICINE 0.6 MG TABLET    Take 0.6 mg by mouth 2 (two) times daily.   DILTIAZEM (CARDIZEM CD) 240 MG 24 HR CAPSULE    Take 240 mg by mouth every morning.    DOCUSATE SODIUM (COLACE) 100 MG CAPSULE    Take 100 mg by mouth daily as needed for mild constipation.   DONEPEZIL (ARICEPT) 10 MG TABLET    Take 10 mg by mouth at bedtime.   EXEMESTANE (AROMASIN) 25 MG TABLET    Take 25 mg by mouth daily after breakfast.   FUROSEMIDE (LASIX) 80 MG TABLET    Take 0.5  tablets (40 mg total) by mouth daily.   GLIPIZIDE (GLUCOTROL) 10 MG TABLET    Take 10 mg by mouth daily before breakfast.   LOPERAMIDE (IMODIUM) 2 MG CAPSULE    Take 4 mg by mouth as needed for diarrhea or loose stools.   LOSARTAN (COZAAR) 50 MG TABLET    Take 50 mg by mouth daily.   MECLIZINE (ANTIVERT) 25 MG TABLET    Take 25 mg by mouth 2 (two) times daily. BID x 1 week   METOCLOPRAMIDE (REGLAN) 5 MG TABLET    Take 5 mg by mouth 2 (two) times daily.   MIRTAZAPINE (REMERON SOL-TAB) 15 MG DISINTEGRATING TABLET    Take 15 mg by mouth at bedtime.   NYSTATIN (MYCOSTATIN) POWDER    Apply topically 2 (two) times daily. Apply to bilateral breast folds and abdominal fold BID x14 days.   ONDANSETRON (ZOFRAN) 8 MG TABLET    Take 8 mg by mouth every 8 (eight) hours as needed for nausea or vomiting.   POLLEN EXTRACTS (PROSTAT PO)    Take 30 mLs by mouth 2 (two) times daily.   SYMBICORT 160-4.5 MCG/ACT INHALER    Inhale 2 puffs into the lungs 2 (two) times daily. Rinse mouth well after use   TRAZODONE (DESYREL) 100 MG TABLET    Take 50 mg by mouth at bedtime.    UNABLE TO FIND    Med Name: MedPass 2.0 Sugar Free 120 mL PO BID for nutritional support   VITAMIN B-12 (CYANOCOBALAMIN) 1000 MCG TABLET    Take 1,000 mcg by mouth daily.  Modified Medications   No medications on file  Discontinued Medications   ALBUTEROL (PROVENTIL HFA;VENTOLIN HFA) 108 (90 BASE) MCG/ACT INHALER    Inhale 2 puffs into the lungs 4 (four) times daily as needed for wheezing or shortness of breath.   AMOXICILLIN-CLAVULANATE (AUGMENTIN) 875-125 MG TABLET    Take 1 tablet by mouth 2 (two) times daily.   CALCIUM CARBONATE-VITAMIN D (OYSTER-CAL 500 + D PO)    Take 1 tablet by mouth 2 (two) times daily.   CIPROFLOXACIN (CIPRO) 500 MG TABLET    Take 500 mg by mouth 2 (two) times daily.   COLCHICINE 0.6 MG TABLET    Take 1 tablet (0.6 mg total) by mouth daily as needed (gout flare).   DOCUSATE CALCIUM (STOOL SOFTENER PO)    Take 1 capsule  by mouth daily as needed (FOR CONSTIPATION).    METFORMIN (GLUCOPHAGE) 500 MG TABLET  Take 500 mg by mouth 2 (two) times daily with a meal.    UNABLE TO FIND    Med Name: nystatin powder - apply to bilateral breast folds and abdominal fold BID x 14 days, stop 01/15/16     Allergies  Allergen Reactions  . Aspirin Other (See Comments)    Avoids due to being on blood thinners  . Ibuprofen Other (See Comments)    Avoids due to being on blood thinners  . Contrast Media [Iodinated Diagnostic Agents] Nausea And Vomiting  . Pravachol Other (See Comments)    Muscle pain     REVIEW OF SYSTEMS:  GENERAL: no change in appetite, no fatigue, no weight changes, no fever, chills or weakness EYES: Denies change in vision, dry eyes, eye pain, itching or discharge EARS: Denies change in hearing, ringing in ears, or earache NOSE: Denies nasal congestion or epistaxis MOUTH and THROAT: Denies oral discomfort, gingival pain or bleeding, pain from teeth or hoarseness   RESPIRATORY: no cough, SOB, DOE, wheezing, hemoptysis CARDIAC: no chest pain GI: no abdominal pain, diarrhea, constipation, heart burn, nausea or vomiting GU: Denies dysuria, frequency, hematuria, incontinence, or discharge PSYCHIATRIC: Denies feeling of depression or anxiety. No report of hallucinations, insomnia, paranoia, or agitation   PHYSICAL EXAMINATION  GENERAL APPEARANCE: Well nourished. In no acute distress. Obese HEAD: Normal in size and contour. No evidence of trauma EYES: Lids open and close normally. No blepharitis, entropion or ectropion. PERRL. Conjunctivae are clear and sclerae are white. Lenses are without opacity EARS: Pinnae are normal. Patient hears normal voice tunes of the examiner MOUTH and THROAT: Lips are without lesions. Oral mucosa is moist and without lesions. Tongue is normal in shape, size, and color and without lesions NECK: supple, trachea midline, no neck masses, no thyroid tenderness, no  thyromegaly LYMPHATICS: no LAN in the neck, no supraclavicular LAN RESPIRATORY: breathing is even & unlabored, BS CTAB; uses O2 @2L /min via [place] CARDIAC: Irregularly irregular, no murmur,no extra heart sounds, no edema GI: abdomen soft, normal BS, no masses, no tenderness, no hepatomegaly, no splenomegaly EXTREMITIES:  Able to move 4 extremities; BLE generalized weakness;  PSYCHIATRIC: Alert and oriented X 3. Affect and behavior are appropriate  LABS/RADIOLOGY: Labs reviewed: 12/15/15  WBC 7.7 hemoglobin 11.5 hematocrit 38.8 MCV 89.4 platelet 215 hemoglobin A1c 5.4 sodium 143 potassium 3.9 glucose 66 BUN 21 creatinine 0.93 calcium 8.4 total protein 5.2 albumin 3.11 globulin 2.1 total bilirubin 0.45 alkaline phosphatase 78 SGOT 14 SGPT 14 Basic Metabolic Panel:  Recent Labs  10/06/15 1025 10/08/15 0409 10/09/15 0541  NA 141 136 138  K 4.2 3.4* 3.9  CL 101 98* 97*  CO2 34* 32 31  GLUCOSE 130* 139* 176*  BUN 8 6 6   CREATININE 0.97 0.77 0.78  CALCIUM 8.6* 8.2* 8.7*   Liver Function Tests:  Recent Labs  10/05/15 2120 10/06/15 1025  AST 19 12*  ALT 6* 9*  ALKPHOS 61 59  BILITOT 1.2 0.8  PROT 6.0* 5.6*  ALBUMIN 3.0* 2.7*   CBC:  Recent Labs  10/05/15 2120 10/06/15 1025 10/08/15 0409 10/09/15 0541  WBC  --  8.1 8.7 11.1*  NEUTROABS 7.6 5.8  --   --   HGB  --  11.1* 10.9* 11.4*  HCT  --  34.8* 34.4* 34.7*  MCV  --  84.1 83.9 83.4  PLT  --  214 216 220   Cardiac Enzymes:  Recent Labs  10/05/15 2120 10/06/15 0330 10/06/15 1025  TROPONINI <0.03 <0.03 <0.03  CBG:  Recent Labs  10/09/15 0729 10/09/15 1104 10/09/15 1613  GLUCAP 192* 277* 235*     ASSESSMENT/PLAN:  Generalized weakness - for transition to restorative program; continue to use sit-to-stand lift  Chronic diastolic heart failure - continue Lasix 40 mg  by mouth daily   Atrial fibrillation - rate controlled; continue Eliquis 5 mg 1 tab by mouth tice a day and diltiazem 24 hours CD 240 mg 1  capsule by mouth every morning  Hypertension - continue Cozaar 50 mg 1 tab by mouth daily and diltiazem 24 hours CD 240 mg 1 capsule by mouth every morning  Gout - continue Colcrys 0.6 mg 1 tab by mouth twice a day  COPD - stable; continue O2 at 2 L/minute, Symbicort 160-4.5 g inhaler 2 puffs twice a day and albuterol when necessary  Diabetes mellitus, type II - hemoglobin A1c 5.4; metformin was recently discontinued and continue Glucotrol 10 mg 1 tab by mouth daily  Breast cancer, right lower inner quadrant - continue Aromasin 25 mg 1 tab by mouth every morning  Protein calorie malnutrition - albumin 3.11; continue Protstat 30 mg by mouth twice a day  Insomnia - continue trazodone 50 mg 1 tab by mouth daily at bedtime  Depression - mood this is stable; continue Remeron 15 mg 1 tab by mouth daily at bedtime  Dementia - continue Aricept 10 mg 1 tab by mouth daily at bedtime    Goals of care:  Long-term care     [facility], [facility] [phone]

## 2016-01-26 ENCOUNTER — Non-Acute Institutional Stay (SKILLED_NURSING_FACILITY): Payer: Medicare Other | Admitting: Adult Health

## 2016-01-26 ENCOUNTER — Encounter: Payer: Self-pay | Admitting: Adult Health

## 2016-01-26 DIAGNOSIS — G4700 Insomnia, unspecified: Secondary | ICD-10-CM | POA: Diagnosis not present

## 2016-01-26 DIAGNOSIS — I481 Persistent atrial fibrillation: Secondary | ICD-10-CM

## 2016-01-26 DIAGNOSIS — F0390 Unspecified dementia without behavioral disturbance: Secondary | ICD-10-CM | POA: Diagnosis not present

## 2016-01-26 DIAGNOSIS — C50311 Malignant neoplasm of lower-inner quadrant of right female breast: Secondary | ICD-10-CM

## 2016-01-26 DIAGNOSIS — I4819 Other persistent atrial fibrillation: Secondary | ICD-10-CM

## 2016-01-26 DIAGNOSIS — E46 Unspecified protein-calorie malnutrition: Secondary | ICD-10-CM

## 2016-01-26 DIAGNOSIS — B372 Candidiasis of skin and nail: Secondary | ICD-10-CM | POA: Diagnosis not present

## 2016-01-26 DIAGNOSIS — F329 Major depressive disorder, single episode, unspecified: Secondary | ICD-10-CM

## 2016-01-26 DIAGNOSIS — E119 Type 2 diabetes mellitus without complications: Secondary | ICD-10-CM

## 2016-01-26 DIAGNOSIS — I5032 Chronic diastolic (congestive) heart failure: Secondary | ICD-10-CM | POA: Diagnosis not present

## 2016-01-26 DIAGNOSIS — M109 Gout, unspecified: Secondary | ICD-10-CM | POA: Diagnosis not present

## 2016-01-26 DIAGNOSIS — I10 Essential (primary) hypertension: Secondary | ICD-10-CM | POA: Diagnosis not present

## 2016-01-26 DIAGNOSIS — J449 Chronic obstructive pulmonary disease, unspecified: Secondary | ICD-10-CM

## 2016-01-26 NOTE — Progress Notes (Signed)
Patient ID: [name], female   DOB: [birth year], 75 y.o.   MRN: [id number]    DATE:     01/26/16  MRN:  [id number]  BIRTHDAY: 12-22-1940  Facility:  Nursing Home Location:  [place] Room Number: [id number]  LEVEL OF CARE:  SNF [phone])  Contact Information    Name Relation Home Work Mobile   Corker,[name] Son [phone]   Nordhoff,Anthony Son [phone]  [phone]       Code Status History    Date Active Date Inactive Code Status Order ID Comments User Context   10/05/2015  9:08 PM 10/09/2015 11:03 PM Full Code [id number]  Lavina Hamman, MD ED   04/17/2014  8:08 PM 04/23/2014  5:07 PM Full Code [id number]  Albertine Patricia, MD ED   01/24/2014  6:18 PM 01/30/2014  4:18 PM Full Code [id number]  Brett Canales, PA-C Inpatient   11/01/2013  1:03 AM 11/07/2013  6:49 PM Full Code [id number]  Louellen Molder, MD Inpatient   08/19/2013  1:52 AM 08/26/2013  8:31 PM Full Code [id number]  Phillips Grout, MD Inpatient   05/03/2013  5:51 PM 05/14/2013  7:10 PM Full Code [id number]  Venetia Maxon Rama, MD Inpatient       Chief Complaint  Patient presents with  . Medical Management of Chronic Issues    HISTORY OF PRESENT ILLNESS:   This is a 75 year old female who is a long-term resident at [facility]. She is being seen for a routine visit. Noted resident to have erythematous rashes under bilateral breast, abdominal folds and groins. BP logs noted to be well-controlled -  117/69, 120/78 and 117/64.  She currently takes Cozaar and Diltiazem for hypertension.  No complaints of insomnia and currently takes  Trazodone 50 mg Q HS .    PAST MEDICAL HISTORY:  Past Medical History  Diagnosis Date  . Asthma   . Bronchitis   . Hernia   . Anemia   . Sarcoidosis ([place])   . Morbid obesity ([place])   . Sarcoid ([place])   . Dyslipidemia   . Cough   . Wheezing   . Chills   . Constipation   . Bruises easily   . Gout attack     ankle, then wrist and hands   . Hypertension   . Atrial fibrillation, persistent ([place]) 11/01/2013  . Chronic anticoagulation, on Eliquis 11/01/2013  . History of nuclear stress test 02/09/2010    dipyridamole; normal pattern of perfusion in all regions with attenuation artifact in inferior region, low risk   . Fracture of left lower leg 06/1972    "no surgery; just casted it"  . CHF (congestive heart failure) ([place])   . Pneumonia 2012; 2014  . Chronic bronchitis ([place])   . OSA on CPAP   . Type II diabetes mellitus ([place])   . Uterine cancer [facility])     s/p hysterectomy  . Breast cancer, stage 1 ([place]) 10/17/2011    s/p right lumpectomy and XRT  . On home oxygen therapy     "2L prn" (01/24/2014)  . Generalized weakness   . Chronic diastolic CHF (congestive heart failure) ([place])   . Dementia without behavioral disturbance   . Gout without tophus      CURRENT MEDICATIONS: Reviewed  Patient's Medications  New Prescriptions   No medications on file  Previous Medications   ACETAMINOPHEN (TYLENOL) 500 MG TABLET    Take 1,000 mg by mouth every  6 (six) hours as needed. pain   ALBUTEROL (PROVENTIL) (2.5 MG/3ML) 0.083% NEBULIZER SOLUTION    Take by nebulization every 6 (six) hours as needed for wheezing or shortness of breath.   APIXABAN (ELIQUIS) 5 MG TABS TABLET    TAKE 1 TABLET (5 MG TOTAL) BY MOUTH 2 (TWO) TIMES DAILY.   CALCIUM CARB-CHOLECALCIFEROL 500-200 MG-UNIT TABS    Take 1 tablet by mouth 2 (two) times daily. Reported on 01/26/2016   COLCHICINE 0.6 MG TABLET    Take 0.6 mg by mouth 2 (two) times daily.   DILTIAZEM (CARDIZEM CD) 240 MG 24 HR CAPSULE    Take 240 mg by mouth every morning.    DOCUSATE SODIUM (COLACE) 100 MG CAPSULE    Take 100 mg by mouth daily as needed for mild constipation.   DONEPEZIL (ARICEPT) 10 MG TABLET    Take 10 mg by mouth at bedtime.   EXEMESTANE (AROMASIN) 25 MG TABLET    Take 25 mg by mouth daily after breakfast.   FUROSEMIDE (LASIX) 40 MG TABLET    Take 40 mg by mouth daily.   GLIPIZIDE  (GLUCOTROL) 5 MG TABLET    Take 5 mg by mouth daily before breakfast.   LOPERAMIDE (IMODIUM) 2 MG CAPSULE    Take 4 mg by mouth as needed for diarrhea or loose stools.   LOSARTAN (COZAAR) 50 MG TABLET    Take 50 mg by mouth daily.   METOCLOPRAMIDE (REGLAN) 5 MG TABLET    Take 5 mg by mouth 2 (two) times daily.   MIRTAZAPINE (REMERON SOL-TAB) 15 MG DISINTEGRATING TABLET    Take 15 mg by mouth at bedtime.   ONDANSETRON (ZOFRAN) 8 MG TABLET    Take 8 mg by mouth every 8 (eight) hours as needed for nausea or vomiting.   OXYGEN    Inhale 2 L/min into the lungs continuous.    POLLEN EXTRACTS (PROSTAT PO)    Take 30 mLs by mouth 2 (two) times daily.   SYMBICORT 160-4.5 MCG/ACT INHALER    Inhale 2 puffs into the lungs 2 (two) times daily. Rinse mouth well after use   TRAZODONE (DESYREL) 50 MG TABLET    Take 50 mg by mouth at bedtime.   VITAMIN B-12 (CYANOCOBALAMIN) 1000 MCG TABLET    Take 1,000 mcg by mouth daily.  Modified Medications   No medications on file  Discontinued Medications   FUROSEMIDE (LASIX) 80 MG TABLET    Take 0.5 tablets (40 mg total) by mouth daily.   GLIPIZIDE (GLUCOTROL) 10 MG TABLET    Take 10 mg by mouth daily before breakfast.   MECLIZINE (ANTIVERT) 25 MG TABLET    Take 25 mg by mouth 2 (two) times daily. BID x 1 week   NYSTATIN (MYCOSTATIN) POWDER    Apply topically 2 (two) times daily. Apply to bilateral breast folds and abdominal fold BID x14 days.   TRAZODONE (DESYREL) 100 MG TABLET    Take 50 mg by mouth at bedtime.    UNABLE TO FIND    Med Name: MedPass 2.0 Sugar Free 120 mL PO BID for nutritional support     Allergies  Allergen Reactions  . Aspirin Other (See Comments)    Avoids due to being on blood thinners  . Ibuprofen Other (See Comments)    Avoids due to being on blood thinners  . Contrast Media [Iodinated Diagnostic Agents] Nausea And Vomiting  . Pravachol Other (See Comments)    Muscle pain  REVIEW OF SYSTEMS:  GENERAL: no change in appetite, no  fatigue, no weight changes, no fever, chills or weakness EYES: Denies change in vision, dry eyes, eye pain, itching or discharge EARS: Denies change in hearing, ringing in ears, or earache NOSE: Denies nasal congestion or epistaxis MOUTH and THROAT: Denies oral discomfort, gingival pain or bleeding, pain from teeth or hoarseness   RESPIRATORY: no cough, SOB, DOE, wheezing, hemoptysis CARDIAC: no chest pain GI: no abdominal pain, diarrhea, constipation, heart burn, nausea or vomiting GU: Denies dysuria, frequency, hematuria, incontinence, or discharge PSYCHIATRIC: Denies feeling of depression or anxiety. No report of hallucinations, insomnia, paranoia, or agitation   PHYSICAL EXAMINATION  GENERAL APPEARANCE: Well nourished. In no acute distress. Obese SKIN:  Has erythematous rashes under bilateral breast, abdominal folds and groin HEAD: Normal in size and contour. No evidence of trauma EYES: Lids open and close normally. No blepharitis, entropion or ectropion. PERRL. Conjunctivae are clear and sclerae are white. Lenses are without opacity EARS: Pinnae are normal. Patient hears normal voice tunes of the examiner MOUTH and THROAT: Lips are without lesions. Oral mucosa is moist and without lesions. Tongue is normal in shape, size, and color and without lesions NECK: supple, trachea midline, no neck masses, no thyroid tenderness, no thyromegaly LYMPHATICS: no LAN in the neck, no supraclavicular LAN RESPIRATORY: breathing is even & unlabored, BS CTAB; uses O2 @2L /min via [place] CARDIAC: Irregularly irregular, no murmur,no extra heart sounds, no edema GI: abdomen soft, normal BS, no masses, no tenderness, no hepatomegaly, no splenomegaly EXTREMITIES:  Able to move 4 extremities; BLE generalized weakness;  PSYCHIATRIC: Alert and oriented X 3. Affect and behavior are appropriate  LABS/RADIOLOGY: Labs reviewed: 12/15/15  WBC 7.7 hemoglobin 11.5 hematocrit 38.8 MCV 89.4 platelet 215 hemoglobin A1c 5.4  sodium 143 potassium 3.9 glucose 66 BUN 21 creatinine 0.93 calcium 8.4 total protein 5.2 albumin 3.11 globulin 2.1 total bilirubin 0.45 alkaline phosphatase 78 SGOT 14 SGPT 14 Basic Metabolic Panel:  Recent Labs  10/06/15 1025 10/08/15 0409 10/09/15 0541 12/15/15  NA 141 136 138 143  K 4.2 3.4* 3.9 3.9  CL 101 98* 97*  --   CO2 34* 32 31  --   GLUCOSE 130* 139* 176*  --   BUN 8 6 6 21   CREATININE 0.97 0.77 0.78 0.9  CALCIUM 8.6* 8.2* 8.7*  --    Liver Function Tests:  Recent Labs  10/05/15 2120 10/06/15 1025 12/15/15  AST 19 12* 14  ALT 6* 9* 14  ALKPHOS 61 59 78  BILITOT 1.2 0.8  --   PROT 6.0* 5.6*  --   ALBUMIN 3.0* 2.7*  --    CBC:  Recent Labs  10/05/15 2120 10/06/15 1025 10/08/15 0409 10/09/15 0541 12/15/15  WBC  --  8.1 8.7 11.1* 7.7  NEUTROABS 7.6 5.8  --   --  5  HGB  --  11.1* 10.9* 11.4* 11.5*  HCT  --  34.8* 34.4* 34.7* 39  MCV  --  84.1 83.9 83.4  --   PLT  --  214 216 220 215   Cardiac Enzymes:  Recent Labs  10/05/15 2120 10/06/15 0330 10/06/15 1025  TROPONINI <0.03 <0.03 <0.03   CBG:  Recent Labs  10/09/15 0729 10/09/15 1104 10/09/15 1613  GLUCAP 192* 277* 235*     ASSESSMENT/PLAN:  Protein calorie malnutrition - continue Protstat 30 mg by mouth twice a day  Candida skin - start on Nystatin 100,000 units/gm cream apply to erythematous rashes under bilateral  breast, abdominal folds and groin BID X 3 weeks then PRN  Depression - mood this is stable; continue Remeron 15 mg 1 tab by mouth daily at bedtime  Chronic diastolic heart failure -  No SOB; continue Lasix 40 mg  by mouth daily   Insomnia - continue trazodone 50 mg 1 tab by mouth daily at bedtime  Atrial fibrillation - rate controlled; continue Eliquis 5 mg 1 tab by mouth tice a day and diltiazem 24 hours CD 240 mg 1 capsule by mouth every morning  Hypertension - continue Cozaar 50 mg 1 tab by mouth daily and diltiazem 24 hours CD 240 mg 1 capsule by mouth every  morning  Gout - continue Colcrys 0.6 mg 1 tab by mouth twice a day  COPD - stable; continue O2 at 2 L/minute, Symbicort 160-4.5 g inhaler 2 puffs twice a day and albuterol when necessary  Diabetes mellitus, type II - continue Glucotrol 5 mg 1 tab by mouth daily  Breast cancer, right lower inner quadrant - continue Aromasin 25 mg 1 tab by mouth every morning  Dementia - continue Aricept 10 mg 1 tab by mouth daily at bedtime       Goals of care:  Long-term care     [facility], NP [facility] [phone]

## 2016-02-16 ENCOUNTER — Encounter: Payer: Self-pay | Admitting: Adult Health

## 2016-02-16 ENCOUNTER — Non-Acute Institutional Stay (SKILLED_NURSING_FACILITY): Payer: Medicare Other | Admitting: Adult Health

## 2016-02-16 DIAGNOSIS — F0390 Unspecified dementia without behavioral disturbance: Secondary | ICD-10-CM | POA: Diagnosis not present

## 2016-02-16 DIAGNOSIS — I10 Essential (primary) hypertension: Secondary | ICD-10-CM | POA: Diagnosis not present

## 2016-02-16 DIAGNOSIS — I5032 Chronic diastolic (congestive) heart failure: Secondary | ICD-10-CM

## 2016-02-16 DIAGNOSIS — E119 Type 2 diabetes mellitus without complications: Secondary | ICD-10-CM | POA: Diagnosis not present

## 2016-02-16 DIAGNOSIS — F329 Major depressive disorder, single episode, unspecified: Secondary | ICD-10-CM

## 2016-02-16 DIAGNOSIS — E46 Unspecified protein-calorie malnutrition: Secondary | ICD-10-CM

## 2016-02-16 DIAGNOSIS — I481 Persistent atrial fibrillation: Secondary | ICD-10-CM | POA: Diagnosis not present

## 2016-02-16 DIAGNOSIS — G4700 Insomnia, unspecified: Secondary | ICD-10-CM | POA: Diagnosis not present

## 2016-02-16 DIAGNOSIS — C50311 Malignant neoplasm of lower-inner quadrant of right female breast: Secondary | ICD-10-CM

## 2016-02-16 DIAGNOSIS — M109 Gout, unspecified: Secondary | ICD-10-CM

## 2016-02-16 DIAGNOSIS — I4819 Other persistent atrial fibrillation: Secondary | ICD-10-CM

## 2016-02-16 DIAGNOSIS — J449 Chronic obstructive pulmonary disease, unspecified: Secondary | ICD-10-CM | POA: Diagnosis not present

## 2016-02-16 NOTE — Progress Notes (Signed)
Patient ID: [name], female   DOB: May 29, 1941, 75 y.o.   MRN: [id number]    DATE:     02/16/16  MRN:  [id number]  BIRTHDAY: 23-Jun-1941  Facility:  Nursing Home Location:  [place] Room Number: [id number]  LEVEL OF CARE:  SNF [phone])  Contact Information    Name Relation Home Work Mobile   Bodnar,[name] Son [phone]   Shepperson,Anthony Son [phone]  [phone]       Code Status History    Date Active Date Inactive Code Status Order ID Comments User Context   10/05/2015  9:08 PM 10/09/2015 11:03 PM Full Code [id number]  Lavina Hamman, MD ED   04/17/2014  8:08 PM 04/23/2014  5:07 PM Full Code [id number]  Albertine Patricia, MD ED   01/24/2014  6:18 PM 01/30/2014  4:18 PM Full Code [id number]  Brett Canales, PA-C Inpatient   11/01/2013  1:03 AM 11/07/2013  6:49 PM Full Code [id number]  Louellen Molder, MD Inpatient   08/19/2013  1:52 AM 08/26/2013  8:31 PM Full Code [id number]  Phillips Grout, MD Inpatient   05/03/2013  5:51 PM 05/14/2013  7:10 PM Full Code [id number]  Venetia Maxon Rama, MD Inpatient       Chief Complaint  Patient presents with  . Medical Management of Chronic Issues    HISTORY OF PRESENT ILLNESS:   This is a 75 year old female who is a long-term resident at [facility]. She is being seen for a routine visit. She has OSA and has CPAP ordered @ [facility]. She has been refusing to use CPAP machine and says that she cannot tolerate them. No SOB noted. She continues to use O2 @ 2L/min via [place]. Her heart rate is well-controlled and currently on Diltiazem and Eliquis for Atrial fibrillation. Review of CBGs - 126, 106, 108, 98, 96. She is currently on Glucotrol 5 mg daily. No complaints of extremity pain. She is currently on Colchicine 0.6 mg BID   PAST MEDICAL HISTORY:  Past Medical History  Diagnosis Date  . Asthma   . Bronchitis   . Hernia   . Anemia   . Sarcoidosis ([place])   . Morbid obesity ([place])   . Sarcoid ([place])   .  Dyslipidemia   . Cough   . Wheezing   . Chills   . Constipation   . Bruises easily   . Gout attack     ankle, then wrist and hands  . Hypertension   . Atrial fibrillation, persistent ([place]) 11/01/2013  . Chronic anticoagulation, on Eliquis 11/01/2013  . History of nuclear stress test 02/09/2010    dipyridamole; normal pattern of perfusion in all regions with attenuation artifact in inferior region, low risk   . Fracture of left lower leg 06/1972    "no surgery; just casted it"  . CHF (congestive heart failure) ([place])   . Pneumonia 2012; 2014  . Chronic bronchitis ([place])   . OSA on CPAP   . Type II diabetes mellitus ([place])   . Uterine cancer [facility])     s/p hysterectomy  . Breast cancer, stage 1 ([place]) 10/17/2011    s/p right lumpectomy and XRT  . On home oxygen therapy     "2L prn" (01/24/2014)  . Generalized weakness   . Chronic diastolic CHF (congestive heart failure) ([place])   . Dementia without behavioral disturbance   . Gout without tophus      CURRENT MEDICATIONS: Reviewed  Medication List       This list is accurate as of: 02/16/16  5:59 PM.  Always use your most recent med list.               acetaminophen 500 MG tablet  Commonly known as:  TYLENOL  Take 1,000 mg by mouth every 6 (six) hours as needed. pain     albuterol (2.5 MG/3ML) 0.083% nebulizer solution  Commonly known as:  PROVENTIL  Take by nebulization every 6 (six) hours as needed for wheezing or shortness of breath.     apixaban 5 MG Tabs tablet  Commonly known as:  ELIQUIS  TAKE 1 TABLET (5 MG TOTAL) BY MOUTH 2 (TWO) TIMES DAILY.     Calcium Carb-Cholecalciferol 500-200 MG-UNIT Tabs  Take 1 tablet by mouth 2 (two) times daily. Reported on 01/26/2016     diltiazem 240 MG 24 hr capsule  Commonly known as:  CARDIZEM CD  Take 240 mg by mouth every morning.     docusate sodium 100 MG capsule  Commonly known as:  COLACE  Take 100 mg by mouth daily as needed for mild constipation.     donepezil 10 MG  tablet  Commonly known as:  ARICEPT  Take 10 mg by mouth at bedtime.     exemestane 25 MG tablet  Commonly known as:  AROMASIN  Take 25 mg by mouth daily after breakfast.     furosemide 40 MG tablet  Commonly known as:  LASIX  Take 40 mg by mouth daily.     loperamide 2 MG capsule  Commonly known as:  IMODIUM  Take 4 mg by mouth as needed for diarrhea or loose stools.     losartan 25 MG tablet  Commonly known as:  COZAAR  Take 25 mg by mouth daily.     metoCLOPramide 5 MG tablet  Commonly known as:  REGLAN  Take 5 mg by mouth 2 (two) times daily.     mirtazapine 15 MG disintegrating tablet  Commonly known as:  REMERON SOL-TAB  Take 15 mg by mouth at bedtime.     ondansetron 8 MG tablet  Commonly known as:  ZOFRAN  Take 8 mg by mouth every 8 (eight) hours as needed for nausea or vomiting.     OXYGEN  Inhale 2 L/min into the lungs continuous.     PROSTAT PO  Take 30 mLs by mouth 2 (two) times daily.     SYMBICORT 160-4.5 MCG/ACT inhaler  Generic drug:  budesonide-formoterol  Inhale 2 puffs into the lungs 2 (two) times daily. Rinse mouth well after use     traZODone 50 MG tablet  Commonly known as:  DESYREL  Take 50 mg by mouth at bedtime.     vitamin B-12 1000 MCG tablet  Commonly known as:  CYANOCOBALAMIN  Take 1,000 mcg by mouth daily.          Allergies  Allergen Reactions  . Aspirin Other (See Comments)    Avoids due to being on blood thinners  . Ibuprofen Other (See Comments)    Avoids due to being on blood thinners  . Contrast Media [Iodinated Diagnostic Agents] Nausea And Vomiting  . Pravachol Other (See Comments)    Muscle pain     REVIEW OF SYSTEMS:  GENERAL: no change in appetite, no fatigue, no weight changes, no fever, chills or weakness EYES: Denies change in vision, dry eyes, eye pain, itching or discharge EARS: Denies change in hearing, ringing in  ears, or earache NOSE: Denies nasal congestion or epistaxis MOUTH and THROAT: Denies  oral discomfort, gingival pain or bleeding, pain from teeth or hoarseness   RESPIRATORY: no cough, SOB, DOE, wheezing, hemoptysis CARDIAC: no chest pain GI: no abdominal pain, diarrhea, constipation, heart burn, nausea or vomiting GU: Denies dysuria, frequency, hematuria, incontinence, or discharge PSYCHIATRIC: Denies feeling of depression or anxiety. No report of hallucinations, insomnia, paranoia, or agitation   PHYSICAL EXAMINATION  GENERAL APPEARANCE: Well nourished. In no acute distress. Obese SKIN:  Skin is warm and dry HEAD: Normal in size and contour. No evidence of trauma EYES: Lids open and close normally. No blepharitis, entropion or ectropion. PERRL. Conjunctivae are clear and sclerae are white. Lenses are without opacity EARS: Pinnae are normal. Patient hears normal voice tunes of the examiner MOUTH and THROAT: Lips are without lesions. Oral mucosa is moist and without lesions. Tongue is normal in shape, size, and color and without lesions NECK: supple, trachea midline, no neck masses, no thyroid tenderness, no thyromegaly LYMPHATICS: no LAN in the neck, no supraclavicular LAN RESPIRATORY: breathing is even & unlabored, BS CTAB; uses O2 @2L /min via [place] CARDIAC: Irregularly irregular, no murmur,no extra heart sounds, no edema GI: abdomen soft, normal BS, no masses, no tenderness, no hepatomegaly, no splenomegaly EXTREMITIES:  Able to move 4 extremities; BLE generalized weakness;  PSYCHIATRIC: Alert and oriented X 3. Affect and behavior are appropriate  LABS/RADIOLOGY: Labs reviewed: 12/15/15  WBC 7.7 hemoglobin 11.5 hematocrit 38.8 MCV 89.4 platelet 215 hemoglobin A1c 5.4 sodium 143 potassium 3.9 glucose 66 BUN 21 creatinine 0.93 calcium 8.4 total protein 5.2 albumin 3.11 globulin 2.1 total bilirubin 0.45 alkaline phosphatase 78 SGOT 14 SGPT 14 Basic Metabolic Panel:  Recent Labs  10/06/15 1025 10/08/15 0409 10/09/15 0541 12/15/15  NA 141 136 138 143  K 4.2 3.4* 3.9 3.9   CL 101 98* 97*  --   CO2 34* 32 31  --   GLUCOSE 130* 139* 176*  --   BUN 8 6 6 21   CREATININE 0.97 0.77 0.78 0.9  CALCIUM 8.6* 8.2* 8.7*  --    Liver Function Tests:  Recent Labs  10/05/15 2120 10/06/15 1025 12/15/15  AST 19 12* 14  ALT 6* 9* 14  ALKPHOS 61 59 78  BILITOT 1.2 0.8  --   PROT 6.0* 5.6*  --   ALBUMIN 3.0* 2.7*  --    CBC:  Recent Labs  10/05/15 2120 10/06/15 1025 10/08/15 0409 10/09/15 0541 12/15/15  WBC  --  8.1 8.7 11.1* 7.7  NEUTROABS 7.6 5.8  --   --  5  HGB  --  11.1* 10.9* 11.4* 11.5*  HCT  --  34.8* 34.4* 34.7* 39  MCV  --  84.1 83.9 83.4  --   PLT  --  214 216 220 215   Cardiac Enzymes:  Recent Labs  10/05/15 2120 10/06/15 0330 10/06/15 1025  TROPONINI <0.03 <0.03 <0.03   CBG:  Recent Labs  10/09/15 0729 10/09/15 1104 10/09/15 1613  GLUCAP 192* 277* 235*     ASSESSMENT/PLAN:  Protein calorie malnutrition - continue Protstat 30 mg by mouth twice a day  Depression - mood this is stable; continue Remeron 15 mg 1 tab by mouth daily at bedtime  Chronic diastolic heart failure -  No SOB; continue Lasix 40 mg  by mouth daily   Insomnia - continue trazodone 50 mg 1 tab by mouth daily at bedtime  Atrial fibrillation - rate controlled; continue Eliquis 5  mg 1 tab by mouth tice a day and diltiazem 24 hours CD 240 mg 1 capsule by mouth every morning; check CBC  Hypertension - continue Cozaar 25 mg 1 tab by mouth daily and diltiazem 24 hours CD 240 mg 1 capsule by mouth every morning; BP/HR BID X 1 week; check CMP  Gout - discontinue Colcrys 0.6 mg 1 tab by mouth twice a day; monitor for gout re-currence  COPD - stable; continue O2 at 2 L/minute, Symbicort 160-4.5 g inhaler 2 puffs twice a day and albuterol when necessary  Diabetes mellitus, type II - discontinue Glucotrol; check hgbA1c, urine microalbumin and CBG Q D X 1 week  Breast cancer, right lower inner quadrant - continue Aromasin 25 mg 1 tab by mouth every  morning  Dementia - continue Aricept 10 mg 1 tab by mouth daily at bedtime       Goals of care:  Long-term care     [facility], NP [facility] [phone]

## 2016-02-17 LAB — BASIC METABOLIC PANEL
BUN: 28 mg/dL — AB (ref 4–21)
Creatinine: 0.9 mg/dL (ref 0.5–1.1)
Glucose: 89 mg/dL
Potassium: 3.8 mmol/L (ref 3.4–5.3)
Sodium: 146 mmol/L (ref 137–147)

## 2016-02-17 LAB — HEMOGLOBIN A1C: Hemoglobin A1C: 4.8

## 2016-02-17 LAB — CBC AND DIFFERENTIAL
HEMATOCRIT: 38 % (ref 36–46)
HEMOGLOBIN: 11.7 g/dL — AB (ref 12.0–16.0)
Neutrophils Absolute: 4 /uL
Platelets: 185 10*3/uL (ref 150–399)
WBC: 6.8 10^3/mL

## 2016-02-17 LAB — HEPATIC FUNCTION PANEL
ALT: 9 U/L (ref 7–35)
AST: 10 U/L — AB (ref 13–35)
Alkaline Phosphatase: 60 U/L (ref 25–125)
BILIRUBIN, TOTAL: 0.6 mg/dL

## 2016-02-17 LAB — MICROALBUMIN, URINE: Microalb, Ur: 15.9

## 2016-02-18 ENCOUNTER — Non-Acute Institutional Stay (SKILLED_NURSING_FACILITY): Payer: Medicare Other | Admitting: Adult Health

## 2016-02-18 ENCOUNTER — Encounter: Payer: Self-pay | Admitting: Adult Health

## 2016-02-18 DIAGNOSIS — N390 Urinary tract infection, site not specified: Secondary | ICD-10-CM | POA: Diagnosis not present

## 2016-02-18 NOTE — Progress Notes (Signed)
Patient ID: [name], female   DOB: 1941/07/07, 75 y.o.   MRN: [id number]    DATE:     02/18/16  MRN:  [id number]  BIRTHDAY: Jun 07, 1941  Facility:  Nursing Home Location:  [place] [facility] [place] Room Number: [id number]  LEVEL OF CARE:  SNF [phone])      Contact Information    Name Relation Home Work Mobile   Tullius,[name] Son [phone]   Dimitri,Anthony Son [phone]  [phone]       Code Status History    Date Active Date Inactive Code Status Order ID Comments User Context   10/05/2015  9:08 PM 10/09/2015 11:03 PM Full Code [id number]  Lavina Hamman, MD ED   04/17/2014  8:08 PM 04/23/2014  5:07 PM Full Code [id number]  Albertine Patricia, MD ED   01/24/2014  6:18 PM 01/30/2014  4:18 PM Full Code [id number]  Brett Canales, PA-C Inpatient   11/01/2013  1:03 AM 11/07/2013  6:49 PM Full Code [id number]  Louellen Molder, MD Inpatient   08/19/2013  1:52 AM 08/26/2013  8:31 PM Full Code [id number]  Phillips Grout, MD Inpatient   05/03/2013  5:51 PM 05/14/2013  7:10 PM Full Code [id number]  Venetia Maxon Rama, MD Inpatient       Chief Complaint  Patient presents with  . Acute Visit    ESBL UTI    HISTORY OF PRESENT ILLNESS:   This is a 75 year old female who had urine culture > 100,000 CFU/ml Klebsiella pneumoniae SSP pneumoniae, ESBL +. No fever reported. Urine culture obtained due to periods of confusion.   PAST MEDICAL HISTORY:  Past Medical History  Diagnosis Date  . Asthma   . Bronchitis   . Hernia   . Anemia   . Sarcoidosis ([place])   . Morbid obesity ([place])   . Sarcoid ([place])   . Dyslipidemia   . Cough   . Wheezing   . Chills   . Constipation   . Bruises easily   . Gout attack     ankle, then wrist and hands  . Hypertension   . Atrial fibrillation, persistent ([place]) 11/01/2013  . Chronic anticoagulation, on Eliquis 11/01/2013  . History of nuclear stress test 02/09/2010    dipyridamole; normal pattern of perfusion in all regions  with attenuation artifact in inferior region, low risk   . Fracture of left lower leg 06/1972    "no surgery; just casted it"  . Chronic diastolic CHF (congestive heart failure) ([place])   . Pneumonia 2012; 2014  . Chronic bronchitis ([place])   . OSA on CPAP   . Type II diabetes mellitus ([place])   . Uterine cancer [facility])     s/p hysterectomy  . Breast cancer, stage 1 ([place]) 10/17/2011    s/p right lumpectomy and XRT  . On home oxygen therapy     "2L prn" (01/24/2014)  . Generalized weakness   . Dementia without behavioral disturbance   . Gout without tophus   . COPD (chronic obstructive pulmonary disease) ([place])   . Protein calorie malnutrition ([place])   . Insomnia      CURRENT MEDICATIONS: Reviewed    Medication List       This list is accurate as of: 02/18/16 11:59 PM.  Always use your most recent med list.               acetaminophen 500 MG tablet  Commonly known as:  TYLENOL  Take 1,000  mg by mouth every 6 (six) hours as needed. pain     albuterol (2.5 MG/3ML) 0.083% nebulizer solution  Commonly known as:  PROVENTIL  Take by nebulization every 6 (six) hours as needed for wheezing or shortness of breath.     apixaban 5 MG Tabs tablet  Commonly known as:  ELIQUIS  TAKE 1 TABLET (5 MG TOTAL) BY MOUTH 2 (TWO) TIMES DAILY.     Calcium Carb-Cholecalciferol 500-200 MG-UNIT Tabs  Take 1 tablet by mouth 2 (two) times daily. Reported on 01/26/2016     diltiazem 240 MG 24 hr capsule  Commonly known as:  CARDIZEM CD  Take 240 mg by mouth every morning.     docusate sodium 100 MG capsule  Commonly known as:  COLACE  Take 100 mg by mouth daily as needed for mild constipation.     donepezil 10 MG tablet  Commonly known as:  ARICEPT  Take 10 mg by mouth at bedtime.     exemestane 25 MG tablet  Commonly known as:  AROMASIN  Take 25 mg by mouth daily after breakfast.     furosemide 40 MG tablet  Commonly known as:  LASIX  Take 40 mg by mouth daily.     loperamide 2 MG capsule   Commonly known as:  IMODIUM  Take 4 mg by mouth as needed for diarrhea or loose stools.     losartan 25 MG tablet  Commonly known as:  COZAAR  Take 25 mg by mouth daily.     metoCLOPramide 5 MG tablet  Commonly known as:  REGLAN  Take 5 mg by mouth 2 (two) times daily.     mirtazapine 15 MG disintegrating tablet  Commonly known as:  REMERON SOL-TAB  Take 15 mg by mouth at bedtime.     nystatin cream  Commonly known as:  MYCOSTATIN  Apply 1 application topically 2 (two) times daily as needed for dry skin. Apply to rashes under bilateral breast folds and groin     ondansetron 8 MG tablet  Commonly known as:  ZOFRAN  Take 8 mg by mouth every 8 (eight) hours as needed for nausea or vomiting.     OXYGEN  Inhale 2 L/min into the lungs continuous.     PROSTAT PO  Take 30 mLs by mouth 2 (two) times daily.     SYMBICORT 160-4.5 MCG/ACT inhaler  Generic drug:  budesonide-formoterol  Inhale 2 puffs into the lungs 2 (two) times daily. Rinse mouth well after use     traZODone 50 MG tablet  Commonly known as:  DESYREL  Take 50 mg by mouth at bedtime.     vitamin B-12 1000 MCG tablet  Commonly known as:  CYANOCOBALAMIN  Take 1,000 mcg by mouth daily.          Allergies  Allergen Reactions  . Aspirin Other (See Comments)    Avoids due to being on blood thinners  . Ibuprofen Other (See Comments)    Avoids due to being on blood thinners  . Contrast Media [Iodinated Diagnostic Agents] Nausea And Vomiting  . Pravachol Other (See Comments)    Muscle pain     REVIEW OF SYSTEMS:  GENERAL: no change in appetite, no fatigue, no weight changes, no fever, chills or weakness EYES: Denies change in vision, dry eyes, eye pain, itching or discharge EARS: Denies change in hearing, ringing in ears, or earache NOSE: Denies nasal congestion or epistaxis MOUTH and THROAT: Denies oral discomfort, gingival pain or  bleeding, pain from teeth or hoarseness   RESPIRATORY: no cough, SOB, DOE,  wheezing, hemoptysis CARDIAC: no chest pain GI: no abdominal pain, diarrhea, constipation, heart burn, nausea or vomiting GU: Denies dysuria, frequency, hematuria, incontinence, or discharge PSYCHIATRIC: Denies feeling of depression or anxiety. No report of hallucinations, insomnia, paranoia, or agitation   PHYSICAL EXAMINATION  GENERAL APPEARANCE: Well nourished. In no acute distress. Obese SKIN:  Skin is warm and dry HEAD: Normal in size and contour. No evidence of trauma EYES: Lids open and close normally. No blepharitis, entropion or ectropion. PERRL. Conjunctivae are clear and sclerae are white. Lenses are without opacity EARS: Pinnae are normal. Patient hears normal voice tunes of the examiner MOUTH and THROAT: Lips are without lesions. Oral mucosa is moist and without lesions. Tongue is normal in shape, size, and color and without lesions NECK: supple, trachea midline, no neck masses, no thyroid tenderness, no thyromegaly LYMPHATICS: no LAN in the neck, no supraclavicular LAN RESPIRATORY: breathing is even & unlabored, BS CTAB; uses O2 @2L /min via [place] CARDIAC: Irregularly irregular, no murmur,no extra heart sounds, no edema GI: abdomen soft, normal BS, no masses, no tenderness, no hepatomegaly, no splenomegaly EXTREMITIES:  Able to move 4 extremities; BLE generalized weakness;  PSYCHIATRIC: Alert and oriented X 3. Affect and behavior are appropriate  LABS/RADIOLOGY: Labs reviewed: 12/15/15  WBC 7.7 hemoglobin 11.5 hematocrit 38.8 MCV 89.4 platelet 215 hemoglobin A1c 5.4 sodium 143 potassium 3.9 glucose 66 BUN 21 creatinine 0.93 calcium 8.4 total protein 5.2 albumin 3.11 globulin 2.1 total bilirubin 0.45 alkaline phosphatase 78 SGOT 14 SGPT 14 Basic Metabolic Panel:  Recent Labs  10/06/15 1025 10/08/15 0409 10/09/15 0541 12/15/15 02/17/16  NA 141 136 138 143 146  K 4.2 3.4* 3.9 3.9 3.8  CL 101 98* 97*  --   --   CO2 34* 32 31  --   --   GLUCOSE 130* 139* 176*  --   --   BUN  8 6 6 21  28*  CREATININE 0.97 0.77 0.78 0.9 0.9  CALCIUM 8.6* 8.2* 8.7*  --   --    Liver Function Tests:  Recent Labs  10/05/15 2120 10/06/15 1025 12/15/15 02/17/16  AST 19 12* 14 10*  ALT 6* 9* 14 9  ALKPHOS 61 59 78 60  BILITOT 1.2 0.8  --   --   PROT 6.0* 5.6*  --   --   ALBUMIN 3.0* 2.7*  --   --    CBC:  Recent Labs  10/06/15 1025 10/08/15 0409 10/09/15 0541 12/15/15 02/17/16  WBC 8.1 8.7 11.1* 7.7 6.8  NEUTROABS 5.8  --   --  5 4  HGB 11.1* 10.9* 11.4* 11.5* 11.7*  HCT 34.8* 34.4* 34.7* 39 38  MCV 84.1 83.9 83.4  --   --   PLT 214 216 220 215 185   Cardiac Enzymes:  Recent Labs  10/05/15 2120 10/06/15 0330 10/06/15 1025  TROPONINI <0.03 <0.03 <0.03   CBG:  Recent Labs  10/09/15 0729 10/09/15 1104 10/09/15 1613  GLUCAP 192* 277* 235*     ASSESSMENT/PLAN:  UTI - start Meropenem 1 gm IV daily X 10 days and Florastor 250 mg 1 capsule PO BID X 13 days; contact precaution   Durenda Age, NP [employer] [phone]

## 2016-03-17 ENCOUNTER — Ambulatory Visit: Payer: Medicare Other | Admitting: Internal Medicine

## 2016-03-17 ENCOUNTER — Non-Acute Institutional Stay (SKILLED_NURSING_FACILITY): Payer: Medicare Other | Admitting: Adult Health

## 2016-03-17 ENCOUNTER — Encounter: Payer: Self-pay | Admitting: Adult Health

## 2016-03-17 DIAGNOSIS — K5901 Slow transit constipation: Secondary | ICD-10-CM | POA: Diagnosis not present

## 2016-03-17 DIAGNOSIS — E46 Unspecified protein-calorie malnutrition: Secondary | ICD-10-CM | POA: Diagnosis not present

## 2016-03-17 DIAGNOSIS — I481 Persistent atrial fibrillation: Secondary | ICD-10-CM | POA: Diagnosis not present

## 2016-03-17 DIAGNOSIS — E119 Type 2 diabetes mellitus without complications: Secondary | ICD-10-CM | POA: Diagnosis not present

## 2016-03-17 DIAGNOSIS — J449 Chronic obstructive pulmonary disease, unspecified: Secondary | ICD-10-CM

## 2016-03-17 DIAGNOSIS — I5032 Chronic diastolic (congestive) heart failure: Secondary | ICD-10-CM | POA: Diagnosis not present

## 2016-03-17 DIAGNOSIS — G4700 Insomnia, unspecified: Secondary | ICD-10-CM | POA: Diagnosis not present

## 2016-03-17 DIAGNOSIS — F0390 Unspecified dementia without behavioral disturbance: Secondary | ICD-10-CM

## 2016-03-17 DIAGNOSIS — I4819 Other persistent atrial fibrillation: Secondary | ICD-10-CM

## 2016-03-17 DIAGNOSIS — C50311 Malignant neoplasm of lower-inner quadrant of right female breast: Secondary | ICD-10-CM

## 2016-03-17 DIAGNOSIS — I10 Essential (primary) hypertension: Secondary | ICD-10-CM | POA: Diagnosis not present

## 2016-03-17 DIAGNOSIS — F329 Major depressive disorder, single episode, unspecified: Secondary | ICD-10-CM

## 2016-03-17 NOTE — Progress Notes (Signed)
Patient ID: [name], female   DOB: [birth year], 75 y.o.   MRN: [id number]    DATE:     03/17/16  MRN:  [id number]  BIRTHDAY: 04/07/1941  Facility:  Nursing Home Location:  [place] [facility] [place] Room Number: [id number]  LEVEL OF CARE:  SNF [phone])      Contact Information    Name Relation Home Work Mobile   Blucher,[name] Son [phone]   Quevedo,Anthony Son [phone]  [phone]       Code Status History    Date Active Date Inactive Code Status Order ID Comments User Context   10/05/2015  9:08 PM 10/09/2015 11:03 PM Full Code [id number]  Lavina Hamman, MD ED   04/17/2014  8:08 PM 04/23/2014  5:07 PM Full Code [id number]  Albertine Patricia, MD ED   01/24/2014  6:18 PM 01/30/2014  4:18 PM Full Code [id number]  Brett Canales, PA-C Inpatient   11/01/2013  1:03 AM 11/07/2013  6:49 PM Full Code [id number]  Louellen Molder, MD Inpatient   08/19/2013  1:52 AM 08/26/2013  8:31 PM Full Code [id number]  Phillips Grout, MD Inpatient   05/03/2013  5:51 PM 05/14/2013  7:10 PM Full Code [id number]  Venetia Maxon Rama, MD Inpatient       Chief Complaint  Patient presents with  . Medical Management of Chronic Issues    HISTORY OF PRESENT ILLNESS:   This is a 75 year old female who is a long-term resident at [facility]. She is being seen for a routine visit. She was recently treated for UTI with Meropenem IV. Her atrial fibrillation is rate-controlled and currently taking Diltiazem. No SOB has been noted. Latest hgbA1c 4.8  .    PAST MEDICAL HISTORY:  Past Medical History  Diagnosis Date  . Asthma   . Bronchitis   . Hernia   . Anemia   . Sarcoidosis ([place])   . Morbid obesity ([place])   . Sarcoid ([place])   . Dyslipidemia   . Cough   . Wheezing   . Chills   . Constipation   . Bruises easily   . Gout attack     ankle, then wrist and hands  . Hypertension   . Atrial fibrillation, persistent ([place]) 11/01/2013  . Chronic anticoagulation, on Eliquis  11/01/2013  . History of nuclear stress test 02/09/2010    dipyridamole; normal pattern of perfusion in all regions with attenuation artifact in inferior region, low risk   . Fracture of left lower leg 06/1972    "no surgery; just casted it"  . Chronic diastolic CHF (congestive heart failure) ([place])   . Pneumonia 2012; 2014  . Chronic bronchitis ([place])   . OSA on CPAP   . Type II diabetes mellitus ([place])   . Uterine cancer [facility])     s/p hysterectomy  . Breast cancer, stage 1 ([place]) 10/17/2011    s/p right lumpectomy and XRT  . On home oxygen therapy     "2L prn" (01/24/2014)  . Generalized weakness   . Dementia without behavioral disturbance   . Gout without tophus   . COPD (chronic obstructive pulmonary disease) ([place])   . Protein calorie malnutrition ([place])   . Insomnia      CURRENT MEDICATIONS: Reviewed    Medication List       This list is accurate as of: 03/17/16 11:59 PM.  Always use your most recent med list.  acetaminophen 500 MG tablet  Commonly known as:  TYLENOL  Take 1,000 mg by mouth every 6 (six) hours as needed. pain     albuterol (2.5 MG/3ML) 0.083% nebulizer solution  Commonly known as:  PROVENTIL  Take by nebulization every 6 (six) hours as needed for wheezing or shortness of breath.     apixaban 5 MG Tabs tablet  Commonly known as:  ELIQUIS  TAKE 1 TABLET (5 MG TOTAL) BY MOUTH 2 (TWO) TIMES DAILY.     Calcium Carb-Cholecalciferol 500-200 MG-UNIT Tabs  Take 1 tablet by mouth 2 (two) times daily. Reported on 01/26/2016     diltiazem 240 MG 24 hr capsule  Commonly known as:  CARDIZEM CD  Take 240 mg by mouth every morning.     docusate sodium 100 MG capsule  Commonly known as:  COLACE  Take 100 mg by mouth daily as needed for mild constipation.     donepezil 10 MG tablet  Commonly known as:  ARICEPT  Take 10 mg by mouth at bedtime.     exemestane 25 MG tablet  Commonly known as:  AROMASIN  Take 25 mg by mouth daily after breakfast.      furosemide 40 MG tablet  Commonly known as:  LASIX  Take 40 mg by mouth daily.     loperamide 2 MG capsule  Commonly known as:  IMODIUM  Take 4 mg by mouth as needed for diarrhea or loose stools.     losartan 25 MG tablet  Commonly known as:  COZAAR  Take 25 mg by mouth daily.     metoCLOPramide 5 MG tablet  Commonly known as:  REGLAN  Take 5 mg by mouth 2 (two) times daily.     mirtazapine 15 MG disintegrating tablet  Commonly known as:  REMERON SOL-TAB  Take 15 mg by mouth at bedtime.     nystatin cream  Commonly known as:  MYCOSTATIN  Apply 1 application topically 2 (two) times daily as needed for dry skin. Apply to rashes under bilateral breast folds and groin     ondansetron 8 MG tablet  Commonly known as:  ZOFRAN  Take 8 mg by mouth every 8 (eight) hours as needed for nausea or vomiting.     OXYGEN  Inhale 2 L/min into the lungs continuous.     PROSTAT PO  Take 30 mLs by mouth 2 (two) times daily.     SYMBICORT 160-4.5 MCG/ACT inhaler  Generic drug:  budesonide-formoterol  Inhale 2 puffs into the lungs 2 (two) times daily. Rinse mouth well after use     traZODone 50 MG tablet  Commonly known as:  DESYREL  Take 50 mg by mouth at bedtime.     vitamin B-12 1000 MCG tablet  Commonly known as:  CYANOCOBALAMIN  Take 1,000 mcg by mouth daily.          Allergies  Allergen Reactions  . Aspirin Other (See Comments)    Avoids due to being on blood thinners  . Ibuprofen Other (See Comments)    Avoids due to being on blood thinners  . Contrast Media [Iodinated Diagnostic Agents] Nausea And Vomiting  . Pravachol Other (See Comments)    Muscle pain     REVIEW OF SYSTEMS:  GENERAL: no change in appetite, no fatigue, no weight changes, no fever, chills or weakness EYES: Denies change in vision, dry eyes, eye pain, itching or discharge EARS: Denies change in hearing, ringing in ears, or earache NOSE: Denies  nasal congestion or epistaxis MOUTH and THROAT:  Denies oral discomfort, gingival pain or bleeding, pain from teeth or hoarseness   RESPIRATORY: no cough, SOB, DOE, wheezing, hemoptysis CARDIAC: no chest pain GI: no abdominal pain, diarrhea, constipation, heart burn, nausea or vomiting GU: Denies dysuria, frequency, hematuria, incontinence, or discharge PSYCHIATRIC: Denies feeling of depression or anxiety. No report of hallucinations, insomnia, paranoia, or agitation   PHYSICAL EXAMINATION  GENERAL APPEARANCE: Well nourished. In no acute distress. Obese SKIN:  Skin is warm and dry HEAD: Normal in size and contour. No evidence of trauma EYES: Lids open and close normally. No blepharitis, entropion or ectropion. PERRL. Conjunctivae are clear and sclerae are white. Lenses are without opacity EARS: Pinnae are normal. Patient hears normal voice tunes of the examiner MOUTH and THROAT: Lips are without lesions. Oral mucosa is moist and without lesions. Tongue is normal in shape, size, and color and without lesions NECK: supple, trachea midline, no neck masses, no thyroid tenderness, no thyromegaly LYMPHATICS: no LAN in the neck, no supraclavicular LAN RESPIRATORY: breathing is even & unlabored, BS CTAB; uses O2 @2L /min via [place] CARDIAC: Irregularly irregular, no murmur,no extra heart sounds, no edema GI: abdomen soft, normal BS, no masses, no tenderness, no hepatomegaly, no splenomegaly EXTREMITIES:  Able to move 4 extremities; BLE generalized weakness;  PSYCHIATRIC: Alert and oriented X 3. Affect and behavior are appropriate  LABS/RADIOLOGY: Labs reviewed: Basic Metabolic Panel:  Recent Labs  10/06/15 1025 10/08/15 0409 10/09/15 0541 12/15/15 02/17/16  NA 141 136 138 143 146  K 4.2 3.4* 3.9 3.9 3.8  CL 101 98* 97*  --   --   CO2 34* 32 31  --   --   GLUCOSE 130* 139* 176*  --   --   BUN 8 6 6 21  28*  CREATININE 0.97 0.77 0.78 0.9 0.9  CALCIUM 8.6* 8.2* 8.7*  --   --    Liver Function Tests:  Recent Labs  10/05/15 2120  10/06/15 1025 12/15/15 02/17/16  AST 19 12* 14 10*  ALT 6* 9* 14 9  ALKPHOS 61 59 78 60  BILITOT 1.2 0.8  --   --   PROT 6.0* 5.6*  --   --   ALBUMIN 3.0* 2.7*  --   --    CBC:  Recent Labs  10/06/15 1025 10/08/15 0409 10/09/15 0541 12/15/15 02/17/16  WBC 8.1 8.7 11.1* 7.7 6.8  NEUTROABS 5.8  --   --  5 4  HGB 11.1* 10.9* 11.4* 11.5* 11.7*  HCT 34.8* 34.4* 34.7* 39 38  MCV 84.1 83.9 83.4  --   --   PLT 214 216 220 215 185   Cardiac Enzymes:  Recent Labs  10/05/15 2120 10/06/15 0330 10/06/15 1025  TROPONINI <0.03 <0.03 <0.03   CBG:  Recent Labs  10/09/15 0729 10/09/15 1104 10/09/15 1613  GLUCAP 192* 277* 235*     ASSESSMENT/PLAN:  Protein calorie malnutrition - continue Protstat 30 mg by mouth twice a day  Depression - mood this is stable; continue Remeron 15 mg 1 tab by mouth daily at bedtime  Chronic diastolic heart failure -  No SOB; continue Lasix 40 mg  by mouth daily   Insomnia - continue trazodone 50 mg 1 tab by mouth daily at bedtime  Atrial fibrillation - rate controlled; continue Eliquis 5 mg 1 tab by mouth tice a day and diltiazem 24 hours CD 240 mg 1 capsule by mouth every morning  Hypertension - continue Cozaar 25 mg  1 tab by mouth daily and diltiazem 24 hours CD 240 mg 1 capsule by mouth every morning  Constipation - continue Colace 100 mg daily PRN  COPD - stable; continue O2 at 2 L/minute, Symbicort 160-4.5 g inhaler 2 puffs twice a day and albuterol when necessary, albuterol PRN  Diabetes mellitus, type II - diet-controlled Lab Results  Component Value Date   HGBA1C 4.8 02/17/2016    Breast cancer, right lower inner quadrant - continue Aromasin 25 mg 1 tab by mouth every morning  Dementia - continue Aricept 10 mg 1 tab by mouth daily at bedtime       Goals of care:  Long-term care     Durenda Age, NP [facility] [phone]

## 2016-04-08 LAB — CBC AND DIFFERENTIAL
HCT: 37 % (ref 36–46)
Hemoglobin: 11.6 g/dL — AB (ref 12.0–16.0)
Platelets: 214 K/µL (ref 150–399)
WBC: 6.9 10*3/mL

## 2016-04-08 LAB — BASIC METABOLIC PANEL
BUN: 22 mg/dL — AB (ref 4–21)
CREATININE: 0.8 mg/dL (ref 0.5–1.1)
GLUCOSE: 149 mg/dL
Potassium: 3.8 mmol/L (ref 3.4–5.3)
SODIUM: 142 mmol/L (ref 137–147)

## 2016-04-12 ENCOUNTER — Encounter: Payer: Self-pay | Admitting: Adult Health

## 2016-04-12 NOTE — Progress Notes (Signed)
Patient ID: [name], female   DOB: [birth year], 75 y.o.   MRN: [id number]

## 2016-04-13 NOTE — Progress Notes (Signed)
This encounter was created in error - please disregard.

## 2016-04-14 LAB — TSH: TSH: 4.41 u[IU]/mL (ref 0.41–5.90)

## 2016-04-25 ENCOUNTER — Encounter: Payer: Self-pay | Admitting: Internal Medicine

## 2016-04-25 ENCOUNTER — Ambulatory Visit (INDEPENDENT_AMBULATORY_CARE_PROVIDER_SITE_OTHER): Payer: Medicare Other | Admitting: Internal Medicine

## 2016-04-25 VITALS — BP 101/71 | HR 83 | Ht 60.0 in | Wt 232.0 lb

## 2016-04-25 DIAGNOSIS — I5032 Chronic diastolic (congestive) heart failure: Secondary | ICD-10-CM | POA: Diagnosis not present

## 2016-04-25 DIAGNOSIS — J9611 Chronic respiratory failure with hypoxia: Secondary | ICD-10-CM

## 2016-04-25 DIAGNOSIS — I482 Chronic atrial fibrillation, unspecified: Secondary | ICD-10-CM

## 2016-04-25 DIAGNOSIS — F0390 Unspecified dementia without behavioral disturbance: Secondary | ICD-10-CM

## 2016-04-25 DIAGNOSIS — D869 Sarcoidosis, unspecified: Secondary | ICD-10-CM

## 2016-04-25 NOTE — Progress Notes (Signed)
OFFICE NOTE  Chief Complaint:  No complaints  Primary Care Physician: Shirline Frees, MD  HPI:  [name] is a 75 year old female who is morbidly obese and recently had bilateral knee replacement. She also has a history of dyslipidemia, hypertension, sleep apnea, diabetes type 2, sarcoidosis, paroxysmal A-fib on Eliquis.  Unfortunately, she also had breast cancer which I understand she is now in remission for. She was recently admitted to the hospital from skilled nursing facility as she was found to be bradycardic with HR in the 40s. Per pt, this has been associated with dyspnea on exertion and at rest, 2-3 pillow orthopnea, progressively worsening LE swelling. Her pulmonologist increased the dose of Lasix to 40 mg BID but she has not started this piror to this admission. She was found to be in acute diastolic heart failure and was diureses. Her breathing had improved however she was having persistent cough and she was on an ACE inhibitor which was changed to an ARB. She improved fairly quickly with diuresis and was discharged with a weight of 244 pounds. Her weight on hospital follow-up was 236 pounds.  She has chronic dyspnea which is not any worse than it was when she was discharged.  Was recently contacted by home health who noted that she's had progressive increase in her weight over the past 2 months. Specifically, over the last week she's had an increase in her weight now up to 260 pounds. This is associated with marked increase in shortness of breath and abdominal fullness with difficulty breathing. She's had progressively increasing doses of Lasix were divided over the past week including increased to 80 mg in the morning and 40 mg at night and most recently 80 mg twice daily, however she did not report an increase in urination and her weight continues to increase.  [name] was recently hospitalized for  Atypical chest pain and a number of other complaints. She was in A. fib  with RVR. Cardiology was consult and no further workup was recommended. Her diuretics were adjusted and currently she is on 40 mg daily of Lasix. Weight is stable compared to her hospital discharge. Blood pressure is well-controlled today. She appears essentially wheelchair-bound. She is in a nursing home.  04/25/2016  [name] returns today for follow-up. She is now at [facility]. She appears to have declined somewhat since her last office visit. She is now essentially wheelchair-bound. She appears pale although recent laboratory work shows no evidence of anemia. In March her hemoglobin A1c was 4.8, H&H of 11 and 37, creatinine of 0.93. Weight today was 232 pounds at the nursing home but she was unable to be weighed here because she's in her wheelchair. She does carry a diagnosis of dementia and depression. She seemed quite flat today. She has significant hearing loss. She denies any chest pain or shortness of breath which is worse when she is off of her oxygen which is 2 L continuous. Her atrial fibrillation is rate controlled and she is on Eliquis 5 mg twice a day as well as diltiazem.  PMHx:  Past Medical History  Diagnosis Date  . Asthma   . Bronchitis   . Hernia   . Anemia   . Sarcoidosis ([place])   . Morbid obesity ([place])   . Sarcoid ([place])   . Dyslipidemia   . Cough   . Wheezing   . Chills   . Constipation   . Bruises easily   . Gout attack  ankle, then wrist and hands  . Hypertension   . Atrial fibrillation, persistent ([place]) 11/01/2013  . Chronic anticoagulation, on Eliquis 11/01/2013  . History of nuclear stress test 02/09/2010    dipyridamole; normal pattern of perfusion in all regions with attenuation artifact in inferior region, low risk   . Fracture of left lower leg 06/1972    "no surgery; just casted it"  . Chronic diastolic CHF (congestive heart failure) ([place])   . Pneumonia 2012; 2014  . Chronic bronchitis ([place])   . OSA on CPAP   . Type II diabetes mellitus ([place])     . Uterine cancer [facility])     s/p hysterectomy  . Breast cancer, stage 1 ([place]) 10/17/2011    s/p right lumpectomy and XRT  . On home oxygen therapy     "2L prn" (01/24/2014)  . Generalized weakness   . Dementia without behavioral disturbance   . Gout without tophus   . COPD (chronic obstructive pulmonary disease) ([place])   . Protein calorie malnutrition ([place])   . Insomnia     Past Surgical History  Procedure Laterality Date  . Total knee arthroplasty Bilateral 03/2010; 06/2011    left; right  . Abdominal hysterectomy  02/04/2005  . Umbilical hernia repair  02/04/2005  . Transthoracic echocardiogram  07/2008    EF, LV size is normal; RVSP normal; mod calcif of MV apparatus  . Breast biopsy Right 01/2011  . Breast lumpectomy Right 01/2011  . Tubal ligation  1970's  . Hernia repair      FAMHx:  Family History  Problem Relation Age of Onset  . Diabetes Mother   . Colon cancer Father   . Diabetes Brother   . Lung cancer Brother   . Diabetes Sister   . Cervical cancer Sister     SOCHx:   reports that she has never smoked. She has never used smokeless tobacco. She reports that she does not drink alcohol or use illicit drugs.  ALLERGIES:  Allergies  Allergen Reactions  . Aspirin Other (See Comments)    Avoids due to being on blood thinners  . Ibuprofen Other (See Comments)    Avoids due to being on blood thinners  . Contrast Media [Iodinated Diagnostic Agents] Nausea And Vomiting  . Pravachol Other (See Comments)    Muscle pain    ROS: A comprehensive review of systems was negative.  HOME MEDS: Current Outpatient Prescriptions  Medication Sig Dispense Refill  . acetaminophen (TYLENOL) 500 MG tablet Take 1,000 mg by mouth every 6 (six) hours as needed. pain    . albuterol (PROVENTIL) (2.5 MG/3ML) 0.083% nebulizer solution Take by nebulization every 6 (six) hours as needed for wheezing or shortness of breath.    Marland Kitchen apixaban (ELIQUIS) 5 MG TABS tablet TAKE 1 TABLET (5 MG TOTAL)  BY MOUTH 2 (TWO) TIMES DAILY. 60 tablet 5  . Calcium Carb-Cholecalciferol 500-200 MG-UNIT TABS Take 1 tablet by mouth 2 (two) times daily. Reported on 01/26/2016    . diltiazem (CARDIZEM CD) 240 MG 24 hr capsule Take 240 mg by mouth every morning.     . docusate sodium (COLACE) 100 MG capsule Take 100 mg by mouth daily as needed for mild constipation.    Marland Kitchen donepezil (ARICEPT) 10 MG tablet Take 10 mg by mouth at bedtime.    Marland Kitchen exemestane (AROMASIN) 25 MG tablet Take 25 mg by mouth daily after breakfast.    . furosemide (LASIX) 40 MG tablet Take 40 mg by mouth daily.    Marland Kitchen  loperamide (IMODIUM) 2 MG capsule Take 4 mg by mouth as needed for diarrhea or loose stools.    Marland Kitchen losartan (COZAAR) 25 MG tablet Take 25 mg by mouth daily.    . metoCLOPramide (REGLAN) 5 MG tablet Take 5 mg by mouth 2 (two) times daily.    . mirtazapine (REMERON SOL-TAB) 15 MG disintegrating tablet Take 15 mg by mouth at bedtime.    Marland Kitchen nystatin cream (MYCOSTATIN) Apply 1 application topically 2 (two) times daily as needed for dry skin. Apply to rashes under bilateral breast folds and groin    . ondansetron (ZOFRAN) 8 MG tablet Take 8 mg by mouth every 8 (eight) hours as needed for nausea or vomiting.    . OXYGEN Inhale 2 L/min into the lungs continuous.     . Pollen Extracts (PROSTAT PO) Take 30 mLs by mouth 2 (two) times daily.    . SYMBICORT 160-4.5 MCG/ACT inhaler Inhale 2 puffs into the lungs 2 (two) times daily. Rinse mouth well after use    . traZODone (DESYREL) 50 MG tablet Take 50 mg by mouth at bedtime.    . vitamin B-12 (CYANOCOBALAMIN) 1000 MCG tablet Take 1,000 mcg by mouth daily.     No current facility-administered medications for this visit.    LABS/IMAGING: No results found for this or any previous visit (from the past 48 hour(s)). No results found.  VITALS: BP 101/71 mmHg  Pulse 83  Ht 5' (1.524 m)  Wt 232 lb (105.235 kg)  BMI 45.31 kg/m2  EXAM: General appearance: alert, mild distress, morbidly obese  and shallow breathing Neck: JVD - 3 cm above sternal notch and no carotid bruit Lungs: diminished breath sounds bibasilar and bilaterally Heart: irregularly irregular rhythm Abdomen: soft, non-tender; bowel sounds normal; no masses,  no organomegaly and obese, protuberant with a fluid wave Extremities: compression stockings in place, trace to 1+ edema Pulses: 2+ and symmetric Skin: Skin color, texture, turgor normal. No rashes or lesions Neurologic: Grossly normal Psych: Mild respiratory distress  EKG: Atrial fibrillation with controlled ventricular response at 83  ASSESSMENT: 1. Chronic diastolic congestive heart failure, NYHA Class III symptoms 2. Persistent atrial fibrillation on Eliquis 3. Hypertension 4. Diabetes type 2 - on insulin 5. Morbid obesity 6. Obstructive sleep apnea 7. Sarcoidosis 8. Pulmonary hypertension on chronic 2 L nasal cannula oxygen 9. Dyslipidemia 10. Wheelchair-bound 11. Depression/dementia  PLAN: 1.   Mrs. Lavoie appears to have declined somewhat since her recent post hospital visit. She has difficulty hearing, appears somewhat disheveled and pale. She is on 2 L oxygen continuous independent in a wheelchair. Weight is actually down to 232 lbs from 254 lbs - 6 months prior. She does not appear volume overloaded on exam. A. fib is rate controlled. There is no sign of bleeding on eloquence and her recent H&H in March was stable. Will plan to continue her current medications and I'm happy to see her back annually or sooner as necessary.  Pixie Casino, MD, [facility] Attending Cardiologist [place] 04/25/2016, 2:30 PM

## 2016-04-25 NOTE — Patient Instructions (Signed)
Your physician wants you to follow-up in: 1 year with Dr. Hilty. You will receive a reminder letter in the mail two months in advance. If you don't receive a letter, please call our office to schedule the follow-up appointment.  

## 2016-05-02 ENCOUNTER — Other Ambulatory Visit: Payer: Self-pay | Admitting: Internal Medicine

## 2016-05-03 ENCOUNTER — Encounter: Payer: Self-pay | Admitting: Adult Health

## 2016-05-03 NOTE — Progress Notes (Signed)
This encounter was created in error - please disregard.

## 2016-05-04 ENCOUNTER — Encounter: Payer: Self-pay | Admitting: Adult Health

## 2016-05-04 NOTE — Progress Notes (Signed)
This encounter was created in error - please disregard.

## 2016-05-05 ENCOUNTER — Other Ambulatory Visit: Payer: Self-pay | Admitting: Internal Medicine

## 2016-05-05 DIAGNOSIS — Z853 Personal history of malignant neoplasm of breast: Secondary | ICD-10-CM

## 2016-05-05 DIAGNOSIS — R410 Disorientation, unspecified: Secondary | ICD-10-CM

## 2016-05-10 ENCOUNTER — Ambulatory Visit (HOSPITAL_COMMUNITY)
Admission: RE | Admit: 2016-05-10 | Discharge: 2016-05-10 | Disposition: A | Discharge status: Home / Self Care | Payer: Medicare Other | Source: Ambulatory Visit | Attending: Internal Medicine | Admitting: Internal Medicine

## 2016-05-10 DIAGNOSIS — R410 Disorientation, unspecified: Secondary | ICD-10-CM

## 2016-05-10 DIAGNOSIS — Z853 Personal history of malignant neoplasm of breast: Secondary | ICD-10-CM | POA: Diagnosis not present

## 2016-05-10 DIAGNOSIS — G319 Degenerative disease of nervous system, unspecified: Secondary | ICD-10-CM | POA: Insufficient documentation

## 2016-05-17 ENCOUNTER — Non-Acute Institutional Stay (SKILLED_NURSING_FACILITY): Payer: Medicare Other | Admitting: Internal Medicine

## 2016-05-17 ENCOUNTER — Encounter: Payer: Self-pay | Admitting: Internal Medicine

## 2016-05-17 DIAGNOSIS — E1165 Type 2 diabetes mellitus with hyperglycemia: Secondary | ICD-10-CM | POA: Insufficient documentation

## 2016-05-17 DIAGNOSIS — I5032 Chronic diastolic (congestive) heart failure: Secondary | ICD-10-CM

## 2016-05-17 DIAGNOSIS — I110 Hypertensive heart disease with heart failure: Secondary | ICD-10-CM | POA: Diagnosis not present

## 2016-05-17 DIAGNOSIS — C50311 Malignant neoplasm of lower-inner quadrant of right female breast: Secondary | ICD-10-CM

## 2016-05-17 DIAGNOSIS — I482 Chronic atrial fibrillation, unspecified: Secondary | ICD-10-CM

## 2016-05-17 DIAGNOSIS — Z794 Long term (current) use of insulin: Secondary | ICD-10-CM | POA: Insufficient documentation

## 2016-05-17 DIAGNOSIS — E119 Type 2 diabetes mellitus without complications: Secondary | ICD-10-CM

## 2016-05-17 DIAGNOSIS — F0390 Unspecified dementia without behavioral disturbance: Secondary | ICD-10-CM | POA: Insufficient documentation

## 2016-05-17 DIAGNOSIS — E669 Obesity, unspecified: Secondary | ICD-10-CM | POA: Diagnosis not present

## 2016-05-17 DIAGNOSIS — E1169 Type 2 diabetes mellitus with other specified complication: Secondary | ICD-10-CM

## 2016-05-17 NOTE — Progress Notes (Signed)
Patient ID: [name], female   DOB: 12/23/1940, 75 y.o.   MRN: [id number]     [facility]   PCP: Shirline Frees, MD  Code Status: full code  Allergies  Allergen Reactions  . Aspirin Other (See Comments)    Avoids due to being on blood thinners  . Ibuprofen Other (See Comments)    Avoids due to being on blood thinners  . Contrast Media [Iodinated Diagnostic Agents] Nausea And Vomiting  . Pravachol Other (See Comments)    Muscle pain    Chief Complaint  Patient presents with  . Medical Management of Chronic Issues    Routine Visit    Advanced Directives 10/07/2015  Does patient have an advance directive? No  Copy of advanced directive(s) in chart? -    HPI:  75 y.o. patient is see for routine visit. She had CT head done with concern for brain mets from her breast cancer per son request. she has been pleasantly confused per nursing staff. She feeds herself. She has been working with restorative therapy and uses a walker with them. She uses a wheelchair other times. She requires assistance with ADLs. No fall reported. No new skin concerns. She has PMH of CHF, dementia, sarcoidosis, HTN, COPD, Afib, DM, gait abnormality among others.   Review of Systems:  Constitutional: Negative for fever  HENT: Negative for headache, congestion Eyes: Negative for double vision and discharge.  Respiratory: Negative for shortness of breath and wheezing.   Cardiovascular: Negative for chest pain, palpitations, leg swelling.  Gastrointestinal: Negative for heartburn, nausea, vomiting, abdominal pain Genitourinary: Negative for dysuria.  Musculoskeletal: Negative for falls Skin: Negative for rash.  Neurological: Negative for dizziness    Past Medical History  Diagnosis Date  . Asthma   . Bronchitis   . Hernia   . Anemia   . Sarcoidosis ([place])   . Morbid obesity ([place])   . Sarcoid ([place])   . Dyslipidemia   . Cough   . Wheezing   . Chills   .  Constipation   . Bruises easily   . Gout attack     ankle, then wrist and hands  . Hypertension   . Atrial fibrillation, persistent ([place]) 11/01/2013  . Chronic anticoagulation, on Eliquis 11/01/2013  . History of nuclear stress test 02/09/2010    dipyridamole; normal pattern of perfusion in all regions with attenuation artifact in inferior region, low risk   . Fracture of left lower leg 06/1972    "no surgery; just casted it"  . Chronic diastolic CHF (congestive heart failure) ([place])   . Pneumonia 2012; 2014  . Chronic bronchitis ([place])   . OSA on CPAP   . Type II diabetes mellitus ([place])   . Uterine cancer [facility])     s/p hysterectomy  . Breast cancer, stage 1 ([place]) 10/17/2011    s/p right lumpectomy and XRT  . On home oxygen therapy     "2L prn" (01/24/2014)  . Generalized weakness   . Dementia without behavioral disturbance   . Gout without tophus   . COPD (chronic obstructive pulmonary disease) ([place])   . Protein calorie malnutrition ([place])   . Insomnia    Past Surgical History  Procedure Laterality Date  . Total knee arthroplasty Bilateral 03/2010; 06/2011    left; right  . Abdominal hysterectomy  02/04/2005  . Umbilical hernia repair  02/04/2005  . Transthoracic echocardiogram  07/2008    EF, LV size is normal; RVSP normal;  mod calcif of MV apparatus  . Breast biopsy Right 01/2011  . Breast lumpectomy Right 01/2011  . Tubal ligation  1970's  . Hernia repair     Social History:   reports that she has never smoked. She has never used smokeless tobacco. She reports that she does not drink alcohol or use illicit drugs.  Family History  Problem Relation Age of Onset  . Diabetes Mother   . Colon cancer Father   . Diabetes Brother   . Lung cancer Brother   . Diabetes Sister   . Cervical cancer Sister     Medications:   Medication List       This list is accurate as of: 05/17/16  1:50 PM.  Always use your most recent med list.               acetaminophen 500 MG tablet    Commonly known as:  TYLENOL  Take 1,000 mg by mouth every 6 (six) hours as needed. pain     albuterol (2.5 MG/3ML) 0.083% nebulizer solution  Commonly known as:  PROVENTIL  Take by nebulization every 6 (six) hours as needed for wheezing or shortness of breath.     apixaban 5 MG Tabs tablet  Commonly known as:  ELIQUIS  TAKE 1 TABLET (5 MG TOTAL) BY MOUTH 2 (TWO) TIMES DAILY.     Calcium Carb-Cholecalciferol 500-200 MG-UNIT Tabs  Take 1 tablet by mouth 2 (two) times daily. Reported on 01/26/2016     diltiazem 240 MG 24 hr capsule  Commonly known as:  CARDIZEM CD  Take 240 mg by mouth every morning.     docusate sodium 100 MG capsule  Commonly known as:  COLACE  Take 100 mg by mouth daily as needed for mild constipation.     donepezil 10 MG tablet  Commonly known as:  ARICEPT  Take 10 mg by mouth at bedtime.     exemestane 25 MG tablet  Commonly known as:  AROMASIN  Take 25 mg by mouth daily after breakfast.     furosemide 40 MG tablet  Commonly known as:  LASIX  Take 40 mg by mouth daily.     loperamide 2 MG capsule  Commonly known as:  IMODIUM  Take 4 mg by mouth as needed for diarrhea or loose stools.     losartan 25 MG tablet  Commonly known as:  COZAAR  Take 25 mg by mouth daily.     metoCLOPramide 5 MG tablet  Commonly known as:  REGLAN  Take 5 mg by mouth 2 (two) times daily.     nystatin cream  Commonly known as:  MYCOSTATIN  Apply 1 application topically 2 (two) times daily as needed for dry skin. Apply to rashes under bilateral breast folds and groin     ondansetron 8 MG tablet  Commonly known as:  ZOFRAN  Take 8 mg by mouth every 8 (eight) hours as needed for nausea or vomiting.     OXYGEN  Inhale 2 L/min into the lungs continuous.     PROSTAT PO  Take 30 mLs by mouth 2 (two) times daily.     SYMBICORT 160-4.5 MCG/ACT inhaler  Generic drug:  budesonide-formoterol  Inhale 2 puffs into the lungs 2 (two) times daily. Rinse mouth well after use      traZODone 50 MG tablet  Commonly known as:  DESYREL  Take 50 mg by mouth at bedtime.     vitamin B-12 1000 MCG tablet  Commonly known as:  CYANOCOBALAMIN  Take 1,000 mcg by mouth daily.         Physical Exam: Filed Vitals:   05/17/16 1338  BP: 125/72  Pulse: 51  Temp: 98.8 F (37.1 C)  TempSrc: Oral  Resp: 20  Height: 5\' 4"  (1.626 m)  Weight: 233 lb 12.8 oz (106.051 kg)  SpO2: 98%  Body mass index is 40.11 kg/(m^2).   General- elderly female, morbidly obese, in no acute distress Head- normocephalic, atraumatic Nose- no maxillary or frontal sinus tenderness, no nasal discharge Throat- moist mucus membrane  Eyes- PERRLA, EOMI, no pallor, no icterus, no discharge, normal conjunctiva, normal sclera Neck- no cervical lymphadenopathy Cardiovascular- irregular heart rate, no murmurs, trace leg edema Respiratory- bilateral equal air entry, no wheeze, no rhonchi, no crackles, no use of accessory muscles, on o2 Abdomen- bowel sounds present, soft, non tender Musculoskeletal- able to move all 4 extremities, generalized weakness more to lower extremities Neurological- alert and oriented to person Skin- warm and dry, intact skin Psychiatry- somewhat flat affect    Labs reviewed: Basic Metabolic Panel:  Recent Labs  10/06/15 1025 10/08/15 0409 10/09/15 0541 12/15/15 02/17/16 04/08/16  NA 141 136 138 143 146 142  K 4.2 3.4* 3.9 3.9 3.8 3.8  CL 101 98* 97*  --   --   --   CO2 34* 32 31  --   --   --   GLUCOSE 130* 139* 176*  --   --   --   BUN 8 6 6 21  28* 22*  CREATININE 0.97 0.77 0.78 0.9 0.9 0.8  CALCIUM 8.6* 8.2* 8.7*  --   --   --    Liver Function Tests:  Recent Labs  10/05/15 2120 10/06/15 1025 12/15/15 02/17/16  AST 19 12* 14 10*  ALT 6* 9* 14 9  ALKPHOS 61 59 78 60  BILITOT 1.2 0.8  --   --   PROT 6.0* 5.6*  --   --   ALBUMIN 3.0* 2.7*  --   --    No results for input(s): LIPASE, AMYLASE in the last 8760 hours. No results for input(s): AMMONIA in  the last 8760 hours. CBC:  Recent Labs  10/06/15 1025 10/08/15 0409 10/09/15 0541 12/15/15 02/17/16 04/08/16  WBC 8.1 8.7 11.1* 7.7 6.8 6.9  NEUTROABS 5.8  --   --  5 4  --   HGB 11.1* 10.9* 11.4* 11.5* 11.7* 11.6*  HCT 34.8* 34.4* 34.7* 39 38 37  MCV 84.1 83.9 83.4  --   --   --   PLT 214 216 220 215 185 214   Cardiac Enzymes:  Recent Labs  10/05/15 2120 10/06/15 0330 10/06/15 1025  TROPONINI <0.03 <0.03 <0.03   BNP: Invalid input(s): POCBNP CBG:  Recent Labs  10/09/15 0729 10/09/15 1104 10/09/15 1613  GLUCAP 192* 277* 235*   Lipid Panel  No results found for: CHOL, TRIG, HDL, CHOLHDL, VLDL, LDLCALC, LDLDIRECT   05/10/16 ct head- no acute intracranial abnormality, stable mild to moderate generalized atrophy and moderate chronic microvascular ischemic changes of white matter   Assessment/Plan  Hypertensive heart disease With heart failure. BP reading on review 101-125/52-70 mostly with one reading of 145/70. continue losartan 25 mg daily. Reviewed BMP  Chronic diastolic chf Appears euvolemic. Continue lasix 40 mg daily and losartan 25 mg daily and monitor  DM Lab Results  Component Value Date   HGBA1C 4.8 02/17/2016   a1c suggestive of controlled diabetes. cbg 110-170. Currently off  all medications. Continue losartan. Will need her diabetic foot and eye exam. Check lipid panel and a1c  afib Rate controlled. Reviewed cardiology notes. Continue cardizem 240 mg daily for rate control. Continue eliquis for anticoagulation  Dementia Continue aricept, to provide assistance with ADLs. Reviewed CT head result  breast cancer Continue exemestane for now. No mets noted to her brain on ct imaging   Labs/tests ordered: lipid panel, a1c  Family/ staff Communication: reviewed care plan with patient and nursing supervisor    Blanchie Serve, MD  [place] [phone] (Monday-Friday 8 am - 5 pm) [phone] (afterhours)

## 2016-05-18 LAB — LIPID PANEL
Cholesterol: 140 mg/dL (ref 0–200)
HDL: 40 mg/dL (ref 35–70)
LDL Cholesterol: 79 mg/dL
TRIGLYCERIDES: 104 mg/dL (ref 40–160)

## 2016-05-26 LAB — CBC AND DIFFERENTIAL
HCT: 35 % — AB (ref 36–46)
Hemoglobin: 10.9 g/dL — AB (ref 12.0–16.0)
PLATELETS: 252 10*3/uL (ref 150–399)
WBC: 7 10*3/mL

## 2016-05-26 LAB — HEPATIC FUNCTION PANEL
ALT: 5 U/L — AB (ref 7–35)
AST: 8 U/L — AB (ref 13–35)
Alkaline Phosphatase: 57 U/L (ref 25–125)
Bilirubin, Total: 0.6 mg/dL

## 2016-05-26 LAB — BASIC METABOLIC PANEL
BUN: 19 mg/dL (ref 4–21)
CREATININE: 0.8 mg/dL (ref 0.5–1.1)
GLUCOSE: 185 mg/dL
POTASSIUM: 4.1 mmol/L (ref 3.4–5.3)
Sodium: 142 mmol/L (ref 137–147)

## 2016-07-12 ENCOUNTER — Non-Acute Institutional Stay (SKILLED_NURSING_FACILITY): Payer: Medicare Other | Admitting: Internal Medicine

## 2016-07-12 ENCOUNTER — Encounter: Payer: Self-pay | Admitting: Internal Medicine

## 2016-07-12 DIAGNOSIS — R7989 Other specified abnormal findings of blood chemistry: Secondary | ICD-10-CM | POA: Diagnosis not present

## 2016-07-12 DIAGNOSIS — F0390 Unspecified dementia without behavioral disturbance: Secondary | ICD-10-CM | POA: Diagnosis not present

## 2016-07-12 DIAGNOSIS — I482 Chronic atrial fibrillation, unspecified: Secondary | ICD-10-CM

## 2016-07-12 DIAGNOSIS — Z794 Long term (current) use of insulin: Secondary | ICD-10-CM | POA: Diagnosis not present

## 2016-07-12 DIAGNOSIS — E118 Type 2 diabetes mellitus with unspecified complications: Secondary | ICD-10-CM

## 2016-07-12 DIAGNOSIS — I130 Hypertensive heart and chronic kidney disease with heart failure and stage 1 through stage 4 chronic kidney disease, or unspecified chronic kidney disease: Secondary | ICD-10-CM

## 2016-07-12 DIAGNOSIS — I509 Heart failure, unspecified: Secondary | ICD-10-CM | POA: Diagnosis not present

## 2016-07-12 DIAGNOSIS — I1310 Hypertensive heart and chronic kidney disease without heart failure, with stage 1 through stage 4 chronic kidney disease, or unspecified chronic kidney disease: Secondary | ICD-10-CM | POA: Insufficient documentation

## 2016-07-12 DIAGNOSIS — R5381 Other malaise: Secondary | ICD-10-CM | POA: Diagnosis not present

## 2016-07-12 DIAGNOSIS — N182 Chronic kidney disease, stage 2 (mild): Secondary | ICD-10-CM | POA: Diagnosis not present

## 2016-07-12 NOTE — Progress Notes (Signed)
Patient ID: [name], female   DOB: 07/21/1941, 75 y.o.   MRN: [id number]     [facility]   PCP: Blanchie Serve, MD  Code Status: Full Code  Allergies  Allergen Reactions  . Aspirin Other (See Comments)    Avoids due to being on blood thinners  . Ibuprofen Other (See Comments)    Avoids due to being on blood thinners  . Contrast Media [Iodinated Diagnostic Agents] Nausea And Vomiting  . Pravachol Other (See Comments)    Muscle pain    Chief Complaint  Patient presents with  . Medical Management of Chronic Issues    Routine Visit    Advanced Directives 10/07/2015  Does patient have an advance directive? No  Type of Advance Directive -  Copy of advanced directive(s) in chart? -  Pre-existing out of facility DNR order (yellow form or pink MOST form) -    HPI:  75 y.o. patient is see for routine visit. She is pleasantly confused per nursing staff. She had increased confusion with increased ammonia level and lactulose has been helpful. She feeds herself. She is now to work with therapy team with her ongoing decline.  She requires assistance with ADLs. She has advanced dementia, CKD, diastolic chf among others.    Review of Systems: limited HPI and ROS Constitutional: Negative for fever  HENT: Negative for headache Respiratory: Negative for shortness of breath  Cardiovascular: Negative for chest pain Gastrointestinal: Negative for heartburn, nausea, vomiting, abdominal pain Genitourinary: Negative for dysuria.  Musculoskeletal: Negative for fall. She is on wheelchair.  Skin: Negative for rash.  Neurological: Negative for dizziness    Past Medical History:  Diagnosis Date  . Anemia   . Asthma   . Atrial fibrillation, persistent ([place]) 11/01/2013  . Breast cancer, stage 1 ([place]) 10/17/2011   s/p right lumpectomy and XRT  . Bronchitis   . Bruises easily   . Chills   . Chronic anticoagulation, on Eliquis 11/01/2013  . Chronic  bronchitis ([place])   . Chronic diastolic CHF (congestive heart failure) ([place])   . Constipation   . COPD (chronic obstructive pulmonary disease) ([place])   . Cough   . Dementia without behavioral disturbance   . Dyslipidemia   . Fracture of left lower leg 06/1972   "no surgery; just casted it"  . Generalized weakness   . Gout attack    ankle, then wrist and hands  . Gout without tophus   . Hernia   . History of nuclear stress test 02/09/2010   dipyridamole; normal pattern of perfusion in all regions with attenuation artifact in inferior region, low risk   . Hypertension   . Insomnia   . Morbid obesity ([place])   . On home oxygen therapy    "2L prn" (01/24/2014)  . OSA on CPAP   . Pneumonia 2012; 2014  . Protein calorie malnutrition ([place])   . Sarcoid ([place])   . Sarcoidosis ([place])   . Type II diabetes mellitus ([place])   . Uterine cancer [facility])    s/p hysterectomy  . Wheezing    Past Surgical History:  Procedure Laterality Date  . ABDOMINAL HYSTERECTOMY  02/04/2005  . BREAST BIOPSY Right 01/2011  . BREAST LUMPECTOMY Right 01/2011  . HERNIA REPAIR    . TOTAL KNEE ARTHROPLASTY Bilateral 03/2010; 06/2011   left; right  . TRANSTHORACIC ECHOCARDIOGRAM  07/2008   EF, LV size is normal; RVSP normal; mod calcif of MV apparatus  . TUBAL  LIGATION  1970's  . UMBILICAL HERNIA REPAIR  02/04/2005   Social History:   reports that she has never smoked. She has never used smokeless tobacco. She reports that she does not drink alcohol or use drugs.  Family History  Problem Relation Age of Onset  . Diabetes Mother   . Colon cancer Father   . Diabetes Brother   . Diabetes Sister   . Lung cancer Brother   . Cervical cancer Sister     Medications:   Medication List       Accurate as of 07/12/16  3:40 PM. Always use your most recent med list.          acetaminophen 500 MG tablet Commonly known as:  TYLENOL Take 1,000 mg by mouth every 6 (six) hours as needed. pain   albuterol (2.5 MG/3ML) 0.083%  nebulizer solution Commonly known as:  PROVENTIL Take by nebulization every 6 (six) hours as needed for wheezing or shortness of breath.   apixaban 5 MG Tabs tablet Commonly known as:  ELIQUIS TAKE 1 TABLET (5 MG TOTAL) BY MOUTH 2 (TWO) TIMES DAILY.   Calcium Carb-Cholecalciferol 500-200 MG-UNIT Tabs Take 1 tablet by mouth 2 (two) times daily. Reported on 01/26/2016   diltiazem 240 MG 24 hr capsule Commonly known as:  CARDIZEM CD Take 240 mg by mouth every morning.   docusate sodium 100 MG capsule Commonly known as:  COLACE Take 100 mg by mouth daily as needed for mild constipation.   donepezil 10 MG tablet Commonly known as:  ARICEPT Take 10 mg by mouth at bedtime.   exemestane 25 MG tablet Commonly known as:  AROMASIN Take 25 mg by mouth daily after breakfast.   furosemide 40 MG tablet Commonly known as:  LASIX Take 40 mg by mouth daily.   lactulose 10 GM/15ML solution Commonly known as:  CHRONULAC Take by mouth daily. Give 15 mL by mouth   losartan 25 MG tablet Commonly known as:  COZAAR Take 25 mg by mouth daily.   metoCLOPramide 5 MG tablet Commonly known as:  REGLAN Take 5 mg by mouth 2 (two) times daily.   nystatin cream Commonly known as:  MYCOSTATIN Apply 1 application topically 2 (two) times daily as needed for dry skin. Apply to rashes under bilateral breast folds and groin   ondansetron 8 MG tablet Commonly known as:  ZOFRAN Take 8 mg by mouth every 8 (eight) hours as needed for nausea or vomiting.   OXYGEN Inhale 2 L/min into the lungs continuous.   SYMBICORT 160-4.5 MCG/ACT inhaler Generic drug:  budesonide-formoterol Inhale 2 puffs into the lungs 2 (two) times daily. Rinse mouth well after use   traZODone 50 MG tablet Commonly known as:  DESYREL Take 50 mg by mouth at bedtime.   vitamin B-12 1000 MCG tablet Commonly known as:  CYANOCOBALAMIN Take 1,000 mcg by mouth daily.        Physical Exam: Vitals:   07/12/16 1529  BP: 130/60    Pulse: 76  Resp: 20  Temp: 98 F (36.7 C)  TempSrc: Oral  SpO2: 96%  Weight: 221 lb 1.6 oz (100.3 kg)  Height: 5\' 4"  (1.626 m)  Body mass index is 37.95 kg/m.   General- elderly female, morbidly obese, in no acute distress Head- normocephalic, atraumatic Throat- moist mucus membrane  Eyes- PERRLA, EOMI, no pallor, no icterus, no discharge Neck- no cervical lymphadenopathy Cardiovascular- irregular heart rate, no murmurs, trace leg edema Respiratory- bilateral equal air entry, no wheeze,  no rhonchi, no crackles, no use of accessory muscles, on o2 Abdomen- bowel sounds present, soft, non tender Musculoskeletal- able to move all 4 extremities, generalized weakness more to lower extremities and can wheel herself on wheelchair Neurological- alert and oriented to person Skin- warm and dry    Labs reviewed: Basic Metabolic Panel:  Recent Labs  10/06/15 1025 10/08/15 0409 10/09/15 0541  02/17/16 04/08/16 05/26/16  NA 141 136 138  < > 146 142 142  K 4.2 3.4* 3.9  < > 3.8 3.8 4.1  CL 101 98* 97*  --   --   --   --   CO2 34* 32 31  --   --   --   --   GLUCOSE 130* 139* 176*  --   --   --   --   BUN 8 6 6   < > 28* 22* 19  CREATININE 0.97 0.77 0.78  < > 0.9 0.8 0.8  CALCIUM 8.6* 8.2* 8.7*  --   --   --   --   < > = values in this interval not displayed. Liver Function Tests:  Recent Labs  10/05/15 2120 10/06/15 1025 12/15/15 02/17/16 05/26/16  AST 19 12* 14 10* 8*  ALT 6* 9* 14 9 5*  ALKPHOS 61 59 78 60 57  BILITOT 1.2 0.8  --   --   --   PROT 6.0* 5.6*  --   --   --   ALBUMIN 3.0* 2.7*  --   --   --    No results for input(s): LIPASE, AMYLASE in the last 8760 hours. No results for input(s): AMMONIA in the last 8760 hours. CBC:  Recent Labs  10/06/15 1025 10/08/15 0409 10/09/15 0541  12/15/15 02/17/16 04/08/16 05/26/16  WBC 8.1 8.7 11.1*  < > 7.7 6.8 6.9 7.0  NEUTROABS 5.8  --   --   --  5 4  --   --   HGB 11.1* 10.9* 11.4*  --  11.5* 11.7* 11.6* 10.9*  HCT  34.8* 34.4* 34.7*  --  39 38 37 35*  MCV 84.1 83.9 83.4  --   --   --   --   --   PLT 214 216 220  --  215 185 214 252  < > = values in this interval not displayed. Cardiac Enzymes:  Recent Labs  10/05/15 2120 10/06/15 0330 10/06/15 1025  TROPONINI <0.03 <0.03 <0.03   BNP: Invalid input(s): POCBNP CBG:  Recent Labs  10/09/15 0729 10/09/15 1104 10/09/15 1613  GLUCAP 192* 277* 235*   Lipid Panel     Component Value Date/Time   CHOL 140 05/18/2016   TRIG 104 05/18/2016   HDL 40 05/18/2016   LDLCALC 79 05/18/2016     05/10/16 ct head- no acute intracranial abnormality, stable mild to moderate generalized atrophy and moderate chronic microvascular ischemic changes of white matter   Assessment/Plan  Elevated ammonia level Per nursing she had increased confusion and was found to have elevated ammonia level. She was started on lactulose and clinical improvement has been noted. On lab review she has normal liver function test. Unclear of etiology at present. She has advanced dementia and has no acute behavioral issues. Monitor clinically for now. Her ammonia level has trended downwards since starting of lactulose. Concern of unexplained hepatic encephalopathy. Will monitor her.  Physical deconditioning Will have patient work with PT/OT as tolerated to regain strength and restore function.  Fall precautions are in place.  Dm type 2 Lab Results  Component Value Date   HGBA1C 4.8 02/17/2016   Off oral hypoglycemic agent and insulin. Monitor clinically  ckd stage 2 Monitor bmp for now  Hypertensive heart and renal disease Continue cozaar 25 mg daily, diltiazem cd 240 mg daily and her eliquis  Chronic afib Continue diltiazem cd 240 mg daily and eliquis  Dementia Continue aricept, to provide assistance with ADLs. Reviewed CT head result    Blanchie Serve, MD  [facility] [phone] (Monday-Friday 8 am - 5 pm) [phone] (afterhours)

## 2016-09-14 ENCOUNTER — Encounter: Payer: Self-pay | Admitting: Internal Medicine

## 2016-09-14 ENCOUNTER — Non-Acute Institutional Stay (SKILLED_NURSING_FACILITY): Payer: Medicare Other | Admitting: Internal Medicine

## 2016-09-14 DIAGNOSIS — N182 Chronic kidney disease, stage 2 (mild): Secondary | ICD-10-CM

## 2016-09-14 DIAGNOSIS — E538 Deficiency of other specified B group vitamins: Secondary | ICD-10-CM | POA: Diagnosis not present

## 2016-09-14 DIAGNOSIS — F0150 Vascular dementia without behavioral disturbance: Secondary | ICD-10-CM | POA: Diagnosis not present

## 2016-09-14 DIAGNOSIS — E669 Obesity, unspecified: Secondary | ICD-10-CM | POA: Diagnosis not present

## 2016-09-14 DIAGNOSIS — I5032 Chronic diastolic (congestive) heart failure: Secondary | ICD-10-CM | POA: Diagnosis not present

## 2016-09-14 DIAGNOSIS — E1169 Type 2 diabetes mellitus with other specified complication: Secondary | ICD-10-CM | POA: Diagnosis not present

## 2016-09-14 DIAGNOSIS — J449 Chronic obstructive pulmonary disease, unspecified: Secondary | ICD-10-CM | POA: Diagnosis not present

## 2016-09-14 DIAGNOSIS — C50311 Malignant neoplasm of lower-inner quadrant of right female breast: Secondary | ICD-10-CM

## 2016-09-14 DIAGNOSIS — I482 Chronic atrial fibrillation, unspecified: Secondary | ICD-10-CM

## 2016-09-14 DIAGNOSIS — I130 Hypertensive heart and chronic kidney disease with heart failure and stage 1 through stage 4 chronic kidney disease, or unspecified chronic kidney disease: Secondary | ICD-10-CM

## 2016-09-14 DIAGNOSIS — K5909 Other constipation: Secondary | ICD-10-CM | POA: Diagnosis not present

## 2016-09-14 DIAGNOSIS — Z0000 Encounter for general adult medical examination without abnormal findings: Secondary | ICD-10-CM

## 2016-09-14 DIAGNOSIS — F5104 Psychophysiologic insomnia: Secondary | ICD-10-CM | POA: Diagnosis not present

## 2016-09-14 NOTE — Progress Notes (Signed)
Patient ID: [name], female   DOB: 07/23/1941, 75 y.o.   MRN: [id number]     [facility]   PCP: Blanchie Serve, MD  Code Status: Full Code  Allergies  Allergen Reactions  . Aspirin Other (See Comments)    Avoids due to being on blood thinners  . Ibuprofen Other (See Comments)    Avoids due to being on blood thinners  . Contrast Media [Iodinated Diagnostic Agents] Nausea And Vomiting  . Pravachol Other (See Comments)    Muscle pain    Chief Complaint  Patient presents with  . Annual Exam    Annual Visit    Advanced Directives 10/07/2015  Does patient have an advance directive? No  Type of Advance Directive -  Copy of advanced directive(s) in chart? -  Pre-existing out of facility DNR order (yellow form or pink MOST form) -    HPI:  75 y.o. patient is see for annual visit. She is pleasantly confused and participates minimally in HPI and ROS. She requires assistance with ADLs. She has advanced dementia, obesity, CKD, diastolic chf among others.    Review of Systems: limited HPI and ROS Constitutional: Negative for fever  HENT: Negative for headache Respiratory: Negative for shortness of breath  Cardiovascular: Negative for chest pain Gastrointestinal: Negative for heartburn, nausea, vomiting, abdominal pain Genitourinary: Negative for dysuria.  Musculoskeletal: Negative for fall. She is on wheelchair.  Skin: Negative for rash.  Neurological: Negative for dizziness    Past Medical History:  Diagnosis Date  . Anemia   . Asthma   . Atrial fibrillation, persistent ([place]) 11/01/2013  . Breast cancer, stage 1 ([place]) 10/17/2011   s/p right lumpectomy and XRT  . Bronchitis   . Bruises easily   . Chills   . Chronic anticoagulation, on Eliquis 11/01/2013  . Chronic bronchitis ([place])   . Chronic diastolic CHF (congestive heart failure) ([place])   . Constipation   . COPD (chronic obstructive pulmonary disease) ([place])   . Cough   .  Dementia without behavioral disturbance   . Dyslipidemia   . Fracture of left lower leg 06/1972   "no surgery; just casted it"  . Generalized weakness   . Gout attack    ankle, then wrist and hands  . Gout without tophus   . Hernia   . History of nuclear stress test 02/09/2010   dipyridamole; normal pattern of perfusion in all regions with attenuation artifact in inferior region, low risk   . Hypertension   . Insomnia   . Morbid obesity ([place])   . On home oxygen therapy    "2L prn" (01/24/2014)  . OSA on CPAP   . Pneumonia 2012; 2014  . Protein calorie malnutrition ([place])   . Sarcoid ([place])   . Sarcoidosis ([place])   . Type II diabetes mellitus ([place])   . Uterine cancer [facility])    s/p hysterectomy  . Wheezing    Past Surgical History:  Procedure Laterality Date  . ABDOMINAL HYSTERECTOMY  02/04/2005  . BREAST BIOPSY Right 01/2011  . BREAST LUMPECTOMY Right 01/2011  . HERNIA REPAIR    . TOTAL KNEE ARTHROPLASTY Bilateral 03/2010; 06/2011   left; right  . TRANSTHORACIC ECHOCARDIOGRAM  07/2008   EF, LV size is normal; RVSP normal; mod calcif of MV apparatus  . TUBAL LIGATION  1970's  . UMBILICAL HERNIA REPAIR  02/04/2005   Social History:   reports that she has never smoked. She has never used smokeless  tobacco. She reports that she does not drink alcohol or use drugs.  Family History  Problem Relation Age of Onset  . Diabetes Mother   . Colon cancer Father   . Diabetes Brother   . Diabetes Sister   . Lung cancer Brother   . Cervical cancer Sister     Medications:   Medication List       Accurate as of 09/14/16  1:13 PM. Always use your most recent med list.          albuterol (2.5 MG/3ML) 0.083% nebulizer solution Commonly known as:  PROVENTIL Take by nebulization every 6 (six) hours as needed for wheezing or shortness of breath.   apixaban 5 MG Tabs tablet Commonly known as:  ELIQUIS TAKE 1 TABLET (5 MG TOTAL) BY MOUTH 2 (TWO) TIMES DAILY.   Calcium Carb-Cholecalciferol  500-200 MG-UNIT Tabs Take 1 tablet by mouth 2 (two) times daily. Reported on 01/26/2016   diltiazem 240 MG 24 hr capsule Commonly known as:  CARDIZEM CD Take 240 mg by mouth every morning.   docusate sodium 100 MG capsule Commonly known as:  COLACE Take 100 mg by mouth daily as needed for mild constipation.   donepezil 10 MG tablet Commonly known as:  ARICEPT Take 10 mg by mouth at bedtime.   exemestane 25 MG tablet Commonly known as:  AROMASIN Take 25 mg by mouth daily after breakfast.   furosemide 40 MG tablet Commonly known as:  LASIX Take 40 mg by mouth daily.   lactulose 10 GM/15ML solution Commonly known as:  CHRONULAC Take by mouth daily. Give 15 mL by mouth   losartan 25 MG tablet Commonly known as:  COZAAR Take 25 mg by mouth daily.   metoCLOPramide 5 MG tablet Commonly known as:  REGLAN Take 5 mg by mouth 2 (two) times daily.   nystatin cream Commonly known as:  MYCOSTATIN Apply 1 application topically 2 (two) times daily as needed for dry skin. Apply to rashes under bilateral breast folds and groin   ondansetron 8 MG tablet Commonly known as:  ZOFRAN Take 8 mg by mouth every 8 (eight) hours as needed for nausea or vomiting.   OXYGEN Inhale 2 L/min into the lungs continuous.   SYMBICORT 160-4.5 MCG/ACT inhaler Generic drug:  budesonide-formoterol Inhale 2 puffs into the lungs 2 (two) times daily. Rinse mouth well after use   traZODone 50 MG tablet Commonly known as:  DESYREL Take 50 mg by mouth at bedtime.   vitamin B-12 1000 MCG tablet Commonly known as:  CYANOCOBALAMIN Take 1,000 mcg by mouth daily.        Physical Exam: Vitals:   09/14/16 1307  BP: 133/79  Resp: 20  Temp: 98 F (36.7 C)  TempSrc: Oral  SpO2: 96%  Weight: 206 lb 9.6 oz (93.7 kg)  Height: 5\' 4"  (1.626 m)  Body mass index is 35.46 kg/m.  Wt Readings from Last 3 Encounters:  09/14/16 206 lb 9.6 oz (93.7 kg)  07/12/16 221 lb 1.6 oz (100.3 kg)  05/17/16 233 lb 12.8  oz (106.1 kg)    General- elderly female, morbidly obese, in no acute distress Head- normocephalic, atraumatic Throat- moist mucus membrane  Eyes- PERRLA, EOMI, no pallor, no icterus, no discharge Neck- no cervical lymphadenopathy Cardiovascular- irregular heart rate, no murmur, trace leg edema Respiratory- bilateral equal air entry, no wheeze, no rhonchi, no crackles, no use of accessory muscles, on o2 Abdomen- bowel sounds present, soft, non tender Musculoskeletal- able to move  all 4 extremities, generalized weakness more to lower extremities and can wheel herself on wheelchair Neurological- alert and oriented to person Skin- warm and dry    Labs reviewed: Basic Metabolic Panel:  Recent Labs  10/06/15 1025 10/08/15 0409 10/09/15 0541  02/17/16 04/08/16 05/26/16  NA 141 136 138  < > 146 142 142  K 4.2 3.4* 3.9  < > 3.8 3.8 4.1  CL 101 98* 97*  --   --   --   --   CO2 34* 32 31  --   --   --   --   GLUCOSE 130* 139* 176*  --   --   --   --   BUN 8 6 6   < > 28* 22* 19  CREATININE 0.97 0.77 0.78  < > 0.9 0.8 0.8  CALCIUM 8.6* 8.2* 8.7*  --   --   --   --   < > = values in this interval not displayed. Liver Function Tests:  Recent Labs  10/05/15 2120 10/06/15 1025 12/15/15 02/17/16 05/26/16  AST 19 12* 14 10* 8*  ALT 6* 9* 14 9 5*  ALKPHOS 61 59 78 60 57  BILITOT 1.2 0.8  --   --   --   PROT 6.0* 5.6*  --   --   --   ALBUMIN 3.0* 2.7*  --   --   --    No results for input(s): LIPASE, AMYLASE in the last 8760 hours. No results for input(s): AMMONIA in the last 8760 hours. CBC:  Recent Labs  10/06/15 1025 10/08/15 0409 10/09/15 0541  12/15/15 02/17/16 04/08/16 05/26/16  WBC 8.1 8.7 11.1*  < > 7.7 6.8 6.9 7.0  NEUTROABS 5.8  --   --   --  5 4  --   --   HGB 11.1* 10.9* 11.4*  --  11.5* 11.7* 11.6* 10.9*  HCT 34.8* 34.4* 34.7*  --  39 38 37 35*  MCV 84.1 83.9 83.4  --   --   --   --   --   PLT 214 216 220  --  215 185 214 252  < > = values in this interval not  displayed.  Lipid Panel     Component Value Date/Time   CHOL 140 05/18/2016   TRIG 104 05/18/2016   HDL 40 05/18/2016   LDLCALC 79 05/18/2016     05/10/16 ct head- no acute intracranial abnormality, stable mild to moderate generalized atrophy and moderate chronic microvascular ischemic changes of white matter  Immunization History  Administered Date(s) Administered  . Influenza Split 09/22/2011, 09/02/2012  . Influenza,inj,Quad PF,36+ Mos 08/22/2013, 09/15/2015  . PPD Test 12/24/2015    Assessment/Plan  Annual exam She is to receive her influenza vaccine. Reviewed her labs. She is under total care and gets supportive care for her dementia. Fall precautions and pressure ulcer prophylaxis are in place  Chronic afib Continue diltiazem cd 240 mg daily for rate control and eliquis for stroke prophylaxis.   Chronic diastolic chf Stable. Continue cozaar and lasix. Continue o2. Bmp reviewed   Dm type 2 Lab Results  Component Value Date   HGBA1C 4.8 02/17/2016   Off oral hypoglycemic agent and insulin. Monitor clinically  ckd stage 2 Monitor bmp for now  Hypertensive heart and renal disease Continue cozaar with lasix. Monitor bp and bmp  Breast cancer Continue aromasin  Dementia Continue aricept, to provide assistance with ADLs. Reviewed CT head result  Chronic constipation  Continue lactulose and colace current regimen  b12 def Continue b12 supplement  Insomnia Continue trazodone 50 mg qhs and monitor  COPD Stable, continue o2 and bronchodilators.    Blanchie Serve, MD  [facility] [phone] (Monday-Friday 8 am - 5 pm) [phone] (afterhours)

## 2016-10-19 ENCOUNTER — Non-Acute Institutional Stay (SKILLED_NURSING_FACILITY): Payer: Medicare Other | Admitting: Internal Medicine

## 2016-10-19 ENCOUNTER — Encounter: Payer: Self-pay | Admitting: Internal Medicine

## 2016-10-19 DIAGNOSIS — I482 Chronic atrial fibrillation, unspecified: Secondary | ICD-10-CM

## 2016-10-19 DIAGNOSIS — G4709 Other insomnia: Secondary | ICD-10-CM

## 2016-10-19 DIAGNOSIS — J449 Chronic obstructive pulmonary disease, unspecified: Secondary | ICD-10-CM

## 2016-10-19 DIAGNOSIS — I5032 Chronic diastolic (congestive) heart failure: Secondary | ICD-10-CM

## 2016-10-19 DIAGNOSIS — I130 Hypertensive heart and chronic kidney disease with heart failure and stage 1 through stage 4 chronic kidney disease, or unspecified chronic kidney disease: Secondary | ICD-10-CM | POA: Diagnosis not present

## 2016-10-19 NOTE — Progress Notes (Signed)
Patient ID: [name], female   DOB: 06/27/1941, 75 y.o.   MRN: [id number]     [facility]   PCP: Blanchie Serve, MD  Code Status: Full Code  Allergies  Allergen Reactions  . Aspirin Other (See Comments)    Avoids due to being on blood thinners  . Ibuprofen Other (See Comments)    Avoids due to being on blood thinners  . Contrast Media [Iodinated Diagnostic Agents] Nausea And Vomiting  . Pravachol Other (See Comments)    Muscle pain    Chief Complaint  Patient presents with  . Medical Management of Chronic Issues    Routine Visit    Advanced Directives 10/07/2015  Does patient have an advance directive? No  Type of Advance Directive -  Copy of advanced directive(s) in chart? -  Pre-existing out of facility DNR order (yellow form or pink MOST form) -    HPI:  76 y.o. patient is see for routine visit. She feeds herself. No fall reported. No pressure ulcer or skin concerns from nursing. She continues to be on oxygen by nasal canula. She has advanced dementia, CKD, diastolic chf among others.    Review of Systems: limited HPI and ROS Constitutional: Negative for fever  HENT: Negative for headache Respiratory: Negative for shortness of breath  Cardiovascular: Negative for chest pain Gastrointestinal: Negative for nausea, vomiting, abdominal pain Genitourinary: Negative for dysuria.  Musculoskeletal: Negative for fall. She is on wheelchair.   Neurological: Negative for dizziness    Past Medical History:  Diagnosis Date  . Anemia   . Asthma   . Atrial fibrillation, persistent ([place]) 11/01/2013  . Breast cancer, stage 1 ([place]) 10/17/2011   s/p right lumpectomy and XRT  . Bronchitis   . Bruises easily   . Chills   . Chronic anticoagulation, on Eliquis 11/01/2013  . Chronic bronchitis ([place])   . Chronic diastolic CHF (congestive heart failure) ([place])   . Constipation   . COPD (chronic obstructive pulmonary disease) ([place])   .  Cough   . Dementia without behavioral disturbance   . Dyslipidemia   . Fracture of left lower leg 06/1972   "no surgery; just casted it"  . Generalized weakness   . Gout attack    ankle, then wrist and hands  . Gout without tophus   . Hernia   . History of nuclear stress test 02/09/2010   dipyridamole; normal pattern of perfusion in all regions with attenuation artifact in inferior region, low risk   . Hypertension   . Insomnia   . Morbid obesity ([place])   . On home oxygen therapy    "2L prn" (01/24/2014)  . OSA on CPAP   . Pneumonia 2012; 2014  . Protein calorie malnutrition ([place])   . Sarcoid ([place])   . Sarcoidosis ([place])   . Type II diabetes mellitus ([place])   . Uterine cancer [facility])    s/p hysterectomy  . Wheezing    Past Surgical History:  Procedure Laterality Date  . ABDOMINAL HYSTERECTOMY  02/04/2005  . BREAST BIOPSY Right 01/2011  . BREAST LUMPECTOMY Right 01/2011  . HERNIA REPAIR    . TOTAL KNEE ARTHROPLASTY Bilateral 03/2010; 06/2011   left; right  . TRANSTHORACIC ECHOCARDIOGRAM  07/2008   EF, LV size is normal; RVSP normal; mod calcif of MV apparatus  . TUBAL LIGATION  1970's  . UMBILICAL HERNIA REPAIR  02/04/2005   Social History:   reports that she has never smoked. She  has never used smokeless tobacco. She reports that she does not drink alcohol or use drugs.  Family History  Problem Relation Age of Onset  . Diabetes Mother   . Colon cancer Father   . Diabetes Brother   . Diabetes Sister   . Lung cancer Brother   . Cervical cancer Sister     Medications:   Medication List       Accurate as of 10/19/16  2:36 PM. Always use your most recent med list.          albuterol (2.5 MG/3ML) 0.083% nebulizer solution Commonly known as:  PROVENTIL Take by nebulization every 6 (six) hours as needed for wheezing or shortness of breath.   apixaban 5 MG Tabs tablet Commonly known as:  ELIQUIS TAKE 1 TABLET (5 MG TOTAL) BY MOUTH 2 (TWO) TIMES DAILY.   Calcium  Carb-Cholecalciferol 500-200 MG-UNIT Tabs Take 1 tablet by mouth 2 (two) times daily. Reported on 01/26/2016   diltiazem 240 MG 24 hr capsule Commonly known as:  CARDIZEM CD Take 240 mg by mouth every morning.   docusate sodium 100 MG capsule Commonly known as:  COLACE Take 100 mg by mouth daily as needed for mild constipation.   donepezil 10 MG tablet Commonly known as:  ARICEPT Take 10 mg by mouth at bedtime.   exemestane 25 MG tablet Commonly known as:  AROMASIN Take 25 mg by mouth daily after breakfast.   furosemide 40 MG tablet Commonly known as:  LASIX Take 40 mg by mouth daily.   lactulose 10 GM/15ML solution Commonly known as:  CHRONULAC Take by mouth daily. Give 15 mL by mouth   losartan 25 MG tablet Commonly known as:  COZAAR Take 25 mg by mouth daily.   metoCLOPramide 5 MG tablet Commonly known as:  REGLAN Take 5 mg by mouth 2 (two) times daily.   nystatin cream Commonly known as:  MYCOSTATIN Apply 1 application topically 2 (two) times daily as needed for dry skin. Apply to rashes under bilateral breast folds and groin   ondansetron 8 MG tablet Commonly known as:  ZOFRAN Take 8 mg by mouth every 8 (eight) hours as needed for nausea or vomiting.   OXYGEN Inhale 2 L/min into the lungs continuous.   SYMBICORT 160-4.5 MCG/ACT inhaler Generic drug:  budesonide-formoterol Inhale 2 puffs into the lungs 2 (two) times daily. Rinse mouth well after use   traZODone 50 MG tablet Commonly known as:  DESYREL Take 50 mg by mouth at bedtime.   vitamin B-12 1000 MCG tablet Commonly known as:  CYANOCOBALAMIN Take 1,000 mcg by mouth daily.        Physical Exam: Vitals:   10/19/16 1431  BP: (!) 92/47  Pulse: 65  Resp: 18  Temp: 97 F (36.1 C)  TempSrc: Oral  SpO2: 92%  Weight: 205 lb (93 kg)  Height: 5\' 4"  (1.626 m)  Body mass index is 35.19 kg/m.  Recheck of BP 112/72  General- elderly female, obese, in no acute distress Head- normocephalic,  atraumatic Eyes- no pallor, no icterus, no discharge Neck- no cervical lymphadenopathy Cardiovascular- irregular heart rate, no murmurs, trace leg edema Respiratory- bilateral equal air entry, no wheeze, no rhonchi, no crackles, no use of accessory muscles, on o2 Abdomen- bowel sounds present, soft, non tender Musculoskeletal- able to move all 4 extremities, generalized weakness more to lower extremities and can wheel herself on wheelchair Neurological- alert and oriented to person Skin- warm and dry    Labs reviewed:  Basic Metabolic Panel:  Recent Labs  02/17/16 04/08/16 05/26/16  NA 146 142 142  K 3.8 3.8 4.1  BUN 28* 22* 19  CREATININE 0.9 0.8 0.8   Liver Function Tests:  Recent Labs  12/15/15 02/17/16 05/26/16  AST 14 10* 8*  ALT 14 9 5*  ALKPHOS 78 60 57   No results for input(s): LIPASE, AMYLASE in the last 8760 hours. No results for input(s): AMMONIA in the last 8760 hours. CBC:  Recent Labs  12/15/15 02/17/16 04/08/16 05/26/16  WBC 7.7 6.8 6.9 7.0  NEUTROABS 5 4  --   --   HGB 11.5* 11.7* 11.6* 10.9*  HCT 39 38 37 35*  PLT 215 185 214 252   Cardiac Enzymes: No results for input(s): CKTOTAL, CKMB, CKMBINDEX, TROPONINI in the last 8760 hours. BNP: Invalid input(s): POCBNP CBG: No results for input(s): GLUCAP in the last 8760 hours. Lipid Panel     Component Value Date/Time   CHOL 140 05/18/2016   TRIG 104 05/18/2016   HDL 40 05/18/2016   LDLCALC 79 05/18/2016     05/10/16 ct head- no acute intracranial abnormality, stable mild to moderate generalized atrophy and moderate chronic microvascular ischemic changes of white matter   Assessment/Plan  Insomnia Stable on trazodone, monitor, no changes made  Chronic afib Rate controlled. Continue eliquis for stroke prophylaxis. Continue diltiazem for rate control  Chronic diastolic chf Continue lasix and cozaar, monitor BP reading. Monitor her breathing. Continue oxygen  Hypertensive heart and renal  disease Continue cozaar and laisx, monitor bmp  COPD Stable breathing. Continue her o2 and bronchodilators. Monitor for signs of exacerbation   Blanchie Serve, MD Internal Medicine [facility] [address] [place], [place] [address] Cell Phone (Monday-Friday 8 am - 5 pm): [phone] On Call: [phone] and follow prompts after 5 pm and on weekends Office Phone: [phone] Office Fax: [phone]

## 2016-10-27 LAB — CBC AND DIFFERENTIAL
HEMATOCRIT: 39 % (ref 36–46)
HEMOGLOBIN: 12.5 g/dL (ref 12.0–16.0)
Platelets: 250 10*3/uL (ref 150–399)
WBC: 7.9 10^3/mL

## 2016-10-27 LAB — LIPID PANEL
CHOLESTEROL: 181 mg/dL (ref 0–200)
HDL: 44 mg/dL (ref 35–70)
LDL CALC: 103 mg/dL
TRIGLYCERIDES: 173 mg/dL — AB (ref 40–160)

## 2016-10-27 LAB — BASIC METABOLIC PANEL
BUN: 14 mg/dL (ref 4–21)
Creatinine: 1 mg/dL (ref 0.5–1.1)
Glucose: 102 mg/dL
POTASSIUM: 4.1 mmol/L (ref 3.4–5.3)
SODIUM: 142 mmol/L (ref 137–147)

## 2016-10-27 LAB — HEMOGLOBIN A1C: Hemoglobin A1C: 5.8

## 2016-10-27 LAB — HEPATIC FUNCTION PANEL
ALT: 5 U/L — AB (ref 7–35)
AST: 10 U/L — AB (ref 13–35)
Alkaline Phosphatase: 55 U/L (ref 25–125)
Bilirubin, Total: 0.6 mg/dL

## 2016-11-25 ENCOUNTER — Encounter: Payer: Self-pay | Admitting: Internal Medicine

## 2016-11-25 ENCOUNTER — Non-Acute Institutional Stay (SKILLED_NURSING_FACILITY): Payer: Medicare Other | Admitting: Internal Medicine

## 2016-11-25 DIAGNOSIS — K5909 Other constipation: Secondary | ICD-10-CM | POA: Diagnosis not present

## 2016-11-25 DIAGNOSIS — I130 Hypertensive heart and chronic kidney disease with heart failure and stage 1 through stage 4 chronic kidney disease, or unspecified chronic kidney disease: Secondary | ICD-10-CM | POA: Diagnosis not present

## 2016-11-25 DIAGNOSIS — I482 Chronic atrial fibrillation, unspecified: Secondary | ICD-10-CM

## 2016-11-25 DIAGNOSIS — J9611 Chronic respiratory failure with hypoxia: Secondary | ICD-10-CM

## 2016-11-25 NOTE — Progress Notes (Signed)
Patient ID: [name], female   DOB: 1941-09-15, 75 y.o.   MRN: [id number]     [facility]   PCP: Blanchie Serve, MD  Code Status: Full Code  Allergies  Allergen Reactions  . Aspirin Other (See Comments)    Avoids due to being on blood thinners  . Ibuprofen Other (See Comments)    Avoids due to being on blood thinners  . Contrast Media [Iodinated Diagnostic Agents] Nausea And Vomiting  . Pravachol Other (See Comments)    Muscle pain    Chief Complaint  Patient presents with  . Medical Management of Chronic Issues    Routine Visit    Advanced Directives 10/07/2015  Does Patient Have a Medical Advance Directive? No  Type of Advance Directive -  [place] in Chart? -  Pre-existing out of facility DNR order (yellow form or pink MOST form) -    HPI:  75 y.o. patient is see for routine visit. She Denies any concerning this visit. No new concerns from nursing staff. She feeds herself. No fall reported. No pressure ulcer or skin concerns from nursing. She continues to be on oxygen by nasal canula. She has advanced dementia, CKD, diastolic chf among others. Low blood pressure reading on review. Blood pressure reading 90/53, 106/71, 104/66   Review of Systems: limited HPI and ROS Constitutional: Negative for fever  HENT: Negative for headache Respiratory: Negative for shortness of breath  Cardiovascular: Negative for chest pain Gastrointestinal: Negative for nausea, vomiting, abdominal pain Genitourinary: Negative for dysuria.  Musculoskeletal: Negative for fall. She is on wheelchair.   Neurological: Negative for dizziness    Past Medical History:  Diagnosis Date  . Anemia   . Asthma   . Atrial fibrillation, persistent ([place]) 11/01/2013  . Breast cancer, stage 1 ([place]) 10/17/2011   s/p right lumpectomy and XRT  . Bronchitis   . Bruises easily   . Chills   . Chronic anticoagulation, on Eliquis 11/01/2013    . Chronic bronchitis ([place])   . Chronic diastolic CHF (congestive heart failure) ([place])   . Constipation   . COPD (chronic obstructive pulmonary disease) ([place])   . Cough   . Dementia without behavioral disturbance   . Dyslipidemia   . Fracture of left lower leg 06/1972   "no surgery; just casted it"  . Generalized weakness   . Gout attack    ankle, then wrist and hands  . Gout without tophus   . Hernia   . History of nuclear stress test 02/09/2010   dipyridamole; normal pattern of perfusion in all regions with attenuation artifact in inferior region, low risk   . Hypertension   . Insomnia   . Morbid obesity ([place])   . On home oxygen therapy    "2L prn" (01/24/2014)  . OSA on CPAP   . Pneumonia 2012; 2014  . Protein calorie malnutrition ([place])   . Sarcoid ([place])   . Sarcoidosis ([place])   . Type II diabetes mellitus ([place])   . Uterine cancer [facility])    s/p hysterectomy  . Wheezing    Past Surgical History:  Procedure Laterality Date  . ABDOMINAL HYSTERECTOMY  02/04/2005  . BREAST BIOPSY Right 01/2011  . BREAST LUMPECTOMY Right 01/2011  . HERNIA REPAIR    . TOTAL KNEE ARTHROPLASTY Bilateral 03/2010; 06/2011   left; right  . TRANSTHORACIC ECHOCARDIOGRAM  07/2008   EF, LV size is normal; RVSP normal; mod calcif of MV  apparatus  . TUBAL LIGATION  1970's  . UMBILICAL HERNIA REPAIR  02/04/2005   Social History:   reports that she has never smoked. She has never used smokeless tobacco. She reports that she does not drink alcohol or use drugs.  Family History  Problem Relation Age of Onset  . Diabetes Mother   . Colon cancer Father   . Diabetes Brother   . Diabetes Sister   . Lung cancer Brother   . Cervical cancer Sister     Medications: Allergies as of 11/25/2016      Reactions   Aspirin Other (See Comments)   Avoids due to being on blood thinners   Ibuprofen Other (See Comments)   Avoids due to being on blood thinners   Contrast Media [iodinated Diagnostic Agents] Nausea And  Vomiting   Pravachol Other (See Comments)   Muscle pain      Medication List       Accurate as of 11/25/16  1:23 PM. Always use your most recent med list.          albuterol (2.5 MG/3ML) 0.083% nebulizer solution Commonly known as:  PROVENTIL Take by nebulization every 6 (six) hours as needed for wheezing or shortness of breath.   apixaban 5 MG Tabs tablet Commonly known as:  ELIQUIS TAKE 1 TABLET (5 MG TOTAL) BY MOUTH 2 (TWO) TIMES DAILY.   Calcium Carb-Cholecalciferol 500-200 MG-UNIT Tabs Take 1 tablet by mouth 2 (two) times daily. Reported on 01/26/2016   diltiazem 240 MG 24 hr capsule Commonly known as:  CARDIZEM CD Take 240 mg by mouth every morning.   docusate sodium 100 MG capsule Commonly known as:  COLACE Take 100 mg by mouth daily as needed for mild constipation.   donepezil 10 MG tablet Commonly known as:  ARICEPT Take 10 mg by mouth at bedtime.   exemestane 25 MG tablet Commonly known as:  AROMASIN Take 25 mg by mouth daily after breakfast.   furosemide 40 MG tablet Commonly known as:  LASIX Take 40 mg by mouth daily.   hydrocerin Crea Apply 1 application topically as needed.   lactulose 10 GM/15ML solution Commonly known as:  CHRONULAC Take by mouth daily. Give 15 mL by mouth   losartan 25 MG tablet Commonly known as:  COZAAR Take 25 mg by mouth daily.   metoCLOPramide 5 MG tablet Commonly known as:  REGLAN Take 5 mg by mouth 2 (two) times daily.   nystatin cream Commonly known as:  MYCOSTATIN Apply 1 application topically 2 (two) times daily as needed for dry skin. Apply to rashes under bilateral breast folds and groin   ondansetron 8 MG tablet Commonly known as:  ZOFRAN Take 8 mg by mouth every 8 (eight) hours as needed for nausea or vomiting.   OXYGEN Inhale 2 L/min into the lungs continuous.   SYMBICORT 160-4.5 MCG/ACT inhaler Generic drug:  budesonide-formoterol Inhale 2 puffs into the lungs 2 (two) times daily. Rinse mouth well  after use   traZODone 50 MG tablet Commonly known as:  DESYREL Take 50 mg by mouth at bedtime.   vitamin B-12 1000 MCG tablet Commonly known as:  CYANOCOBALAMIN Take 1,000 mcg by mouth daily.        Physical Exam: Vitals:   11/25/16 1316  BP: (!) 90/53  Pulse: 70  Resp: 20  Temp: 97 F (36.1 C)  TempSrc: Oral  SpO2: 96%  Weight: 201 lb 12.8 oz (91.5 kg)  Height: 5\' 4"  (1.626 m)  Body mass index is 34.64 kg/m.  Wt Readings from Last 3 Encounters:  11/25/16 201 lb 12.8 oz (91.5 kg)  10/19/16 205 lb (93 kg)  09/14/16 206 lb 9.6 oz (93.7 kg)   General- elderly female, obese, in no acute distress Head- normocephalic, atraumatic Eyes- no pallor, no icterus, no discharge Neck- no cervical lymphadenopathy Cardiovascular- irregular heart rate, no murmurs, trace leg edema Respiratory- bilateral equal air entry, no wheeze, no rhonchi, no crackles, no use of accessory muscles, on o2 Abdomen- bowel sounds present, soft, non tender Musculoskeletal- able to move all 4 extremities, generalized weakness more to lower extremities and can wheel herself on wheelchair Neurological- alert and oriented to person only Skin- warm and dry    Labs reviewed: Basic Metabolic Panel:  Recent Labs  04/08/16 05/26/16 10/27/16  NA 142 142 142  K 3.8 4.1 4.1  BUN 22* 19 14  CREATININE 0.8 0.8 1.0   Liver Function Tests:  Recent Labs  02/17/16 05/26/16 10/27/16  AST 10* 8* 10*  ALT 9 5* 5*  ALKPHOS 60 57 55   No results for input(s): LIPASE, AMYLASE in the last 8760 hours. No results for input(s): AMMONIA in the last 8760 hours. CBC:  Recent Labs  12/15/15 02/17/16 04/08/16 05/26/16 10/27/16  WBC 7.7 6.8 6.9 7.0 7.9  NEUTROABS 5 4  --   --   --   HGB 11.5* 11.7* 11.6* 10.9* 12.5  HCT 39 38 37 35* 39  PLT 215 185 214 252 250   Cardiac Enzymes: No results for input(s): CKTOTAL, CKMB, CKMBINDEX, TROPONINI in the last 8760 hours. BNP: Invalid input(s): POCBNP CBG: No results  for input(s): GLUCAP in the last 8760 hours. Lipid Panel     Component Value Date/Time   CHOL 181 10/27/2016   TRIG 173 (A) 10/27/2016   HDL 44 10/27/2016   LDLCALC 103 10/27/2016     05/10/16 ct head- no acute intracranial abnormality, stable mild to moderate generalized atrophy and moderate chronic microvascular ischemic changes of white matter   Assessment/Plan  Atrial fibrillation Chronic, stable. Continue diltiazem 240 mg daily for rate control. Continue ELiquis 5 mg twice a day for stroke prophylaxis.  Chronic constipation Continue lactulose daily and Colace daily as needed for constipation. Monitor bowel movement.  Chronic respiratory failure Stable breathing. Continue her oxygen by nasal cannula. Continue her bronchodilators.   Hypertensive heart and renal disease Low blood pressure reading on review. Currently on Cozaar 25 mg daily and Lasix 40 mg daily. Change Lasix to 20 mg daily for now. Check blood pressure twice a day for 2 weeks.     Blanchie Serve, MD Internal Medicine [facility] [address] [place], [place] [address] Cell Phone (Monday-Friday 8 am - 5 pm): [phone] On Call: [phone] and follow prompts after 5 pm and on weekends Office Phone: [phone] Office Fax: [phone]

## 2017-02-17 ENCOUNTER — Non-Acute Institutional Stay (SKILLED_NURSING_FACILITY): Payer: Medicare Other | Admitting: Internal Medicine

## 2017-02-17 ENCOUNTER — Encounter: Payer: Self-pay | Admitting: Internal Medicine

## 2017-02-17 DIAGNOSIS — I482 Chronic atrial fibrillation, unspecified: Secondary | ICD-10-CM

## 2017-02-17 DIAGNOSIS — E7220 Disorder of urea cycle metabolism, unspecified: Secondary | ICD-10-CM

## 2017-02-17 DIAGNOSIS — C50311 Malignant neoplasm of lower-inner quadrant of right female breast: Secondary | ICD-10-CM

## 2017-02-17 DIAGNOSIS — N183 Chronic kidney disease, stage 3 unspecified: Secondary | ICD-10-CM

## 2017-02-17 DIAGNOSIS — I130 Hypertensive heart and chronic kidney disease with heart failure and stage 1 through stage 4 chronic kidney disease, or unspecified chronic kidney disease: Secondary | ICD-10-CM | POA: Diagnosis not present

## 2017-02-17 NOTE — Progress Notes (Signed)
Patient ID: [name], female   DOB: 10/05/1941, 76 y.o.   MRN: [id number]     [facility]   PCP: Blanchie Serve, MD  Code Status: DNR  Allergies  Allergen Reactions  . Aspirin Other (See Comments)    Avoids due to being on blood thinners  . Ibuprofen Other (See Comments)    Avoids due to being on blood thinners  . Contrast Media [Iodinated Diagnostic Agents] Nausea And Vomiting  . Pravachol Other (See Comments)    Muscle pain    Chief Complaint  Patient presents with  . Medical Management of Chronic Issues    Routine Visit     Advanced Directives 02/17/2017  Does Patient Have a Medical Advance Directive? Yes  Type of Advance Directive Out of facility DNR (pink MOST or yellow form)  Does patient want to make changes to medical advance directive? No - Patient declined  Copy of [place] in Chart? -  Pre-existing out of facility DNR order (yellow form or pink MOST form) -    HPI:  76 y.o. patient is seen today for routine visit. She Denies any concern this visit. No new concerns from nursing staff. She Is out of bed daily and feeds herself. She is transferred via [place]. She takes her medications. No fall reported. No pressure ulcer or skin concerns from nursing. Reviewed her blood pressure readings with most of the readings on the lower side with systolic blood pressure in 12W and diastolic blood pressure in 50s.She has advanced dementia, CKD, diastolic chf among others.   Review of Systems: limited HPI and ROS Constitutional: Negative for fever  HENT: Negative for headache and congestion Respiratory: Negative for shortness of breath , cough Cardiovascular: Negative for chest pain, palpitations Gastrointestinal: Negative for nausea, vomiting, abdominal pain. She had a bowel movement this morning. Genitourinary: Negative for dysuria.  Musculoskeletal: Negative for fall. She is on wheelchair.   Neurological:  Negative for dizziness    Past Medical History:  Diagnosis Date  . Anemia   . Asthma   . Atrial fibrillation, persistent ([place]) 11/01/2013  . Breast cancer, stage 1 ([place]) 10/17/2011   s/p right lumpectomy and XRT  . Bronchitis   . Bruises easily   . Chills   . Chronic anticoagulation, on Eliquis 11/01/2013  . Chronic bronchitis ([place])   . Chronic diastolic CHF (congestive heart failure) ([place])   . Constipation   . COPD (chronic obstructive pulmonary disease) ([place])   . Cough   . Dementia without behavioral disturbance   . Dyslipidemia   . Fracture of left lower leg 06/1972   "no surgery; just casted it"  . Generalized weakness   . Gout attack    ankle, then wrist and hands  . Gout without tophus   . Hernia   . History of nuclear stress test 02/09/2010   dipyridamole; normal pattern of perfusion in all regions with attenuation artifact in inferior region, low risk   . Hypertension   . Insomnia   . Morbid obesity ([place])   . On home oxygen therapy    "2L prn" (01/24/2014)  . OSA on CPAP   . Pneumonia 2012; 2014  . Protein calorie malnutrition ([place])   . Sarcoid ([place])   . Sarcoidosis ([place])   . Type II diabetes mellitus ([place])   . Uterine cancer [facility])    s/p hysterectomy  . Wheezing    Past Surgical History:  Procedure Laterality Date  .  ABDOMINAL HYSTERECTOMY  02/04/2005  . BREAST BIOPSY Right 01/2011  . BREAST LUMPECTOMY Right 01/2011  . HERNIA REPAIR    . TOTAL KNEE ARTHROPLASTY Bilateral 03/2010; 06/2011   left; right  . TRANSTHORACIC ECHOCARDIOGRAM  07/2008   EF, LV size is normal; RVSP normal; mod calcif of MV apparatus  . TUBAL LIGATION  1970's  . UMBILICAL HERNIA REPAIR  02/04/2005   Social History:   reports that she has never smoked. She has never used smokeless tobacco. She reports that she does not drink alcohol or use drugs.  Family History  Problem Relation Age of Onset  . Diabetes Mother   . Colon cancer Father   . Diabetes Brother   . Diabetes Sister   .  Lung cancer Brother   . Cervical cancer Sister     Medications: Allergies as of 02/17/2017      Reactions   Aspirin Other (See Comments)   Avoids due to being on blood thinners   Ibuprofen Other (See Comments)   Avoids due to being on blood thinners   Contrast Media [iodinated Diagnostic Agents] Nausea And Vomiting   Pravachol Other (See Comments)   Muscle pain      Medication List       Accurate as of 02/17/17  1:05 PM. Always use your most recent med list.          albuterol (2.5 MG/3ML) 0.083% nebulizer solution Commonly known as:  PROVENTIL Take by nebulization every 6 (six) hours as needed for wheezing or shortness of breath.   apixaban 5 MG Tabs tablet Commonly known as:  ELIQUIS TAKE 1 TABLET (5 MG TOTAL) BY MOUTH 2 (TWO) TIMES DAILY.   Calcium Carb-Cholecalciferol 500-200 MG-UNIT Tabs Take 1 tablet by mouth 2 (two) times daily. Reported on 01/26/2016   diltiazem 240 MG 24 hr capsule Commonly known as:  CARDIZEM CD Take 240 mg by mouth every morning.   docusate sodium 100 MG capsule Commonly known as:  COLACE Take 100 mg by mouth daily as needed for mild constipation.   donepezil 10 MG tablet Commonly known as:  ARICEPT Take 10 mg by mouth at bedtime.   exemestane 25 MG tablet Commonly known as:  AROMASIN Take 25 mg by mouth daily after breakfast.   furosemide 20 MG tablet Commonly known as:  LASIX Take 20 mg by mouth daily.   hydrocerin Crea Apply 1 application topically daily as needed.   lactulose 10 GM/15ML solution Commonly known as:  CHRONULAC Take by mouth daily. Give 15 mL by mouth   losartan 25 MG tablet Commonly known as:  COZAAR Take 25 mg by mouth daily.   metoCLOPramide 5 MG tablet Commonly known as:  REGLAN Take 5 mg by mouth 2 (two) times daily.   ondansetron 8 MG tablet Commonly known as:  ZOFRAN Take 8 mg by mouth every 8 (eight) hours as needed for nausea or vomiting.   OXYGEN Inhale 2 L/min into the lungs continuous.     SYMBICORT 160-4.5 MCG/ACT inhaler Generic drug:  budesonide-formoterol Inhale 2 puffs into the lungs 2 (two) times daily. Rinse mouth well after use   traZODone 50 MG tablet Commonly known as:  DESYREL Take 50 mg by mouth at bedtime.   vitamin B-12 1000 MCG tablet Commonly known as:  CYANOCOBALAMIN Take 1,000 mcg by mouth daily.        Physical Exam: Vitals:   02/17/17 1251  BP: 99/64  Pulse: 64  Resp: 18  Temp: (!) 96.9  F (36.1 C)  TempSrc: Oral  SpO2: 97%  Weight: 200 lb 12.8 oz (91.1 kg)  Height: 5\' 4"  (1.626 m)  Body mass index is 34.47 kg/m.  Wt Readings from Last 3 Encounters:  02/17/17 200 lb 12.8 oz (91.1 kg)  11/25/16 201 lb 12.8 oz (91.5 kg)  10/19/16 205 lb (93 kg)   General- elderly female, obese, in no acute distress Head- normocephalic, atraumatic Eyes- no pallor, no icterus, no discharge Neck- no cervical lymphadenopathy Cardiovascular- irregular heart rate, no murmur, trace leg edema Respiratory- bilateral equal air entry, no wheeze, no rhonchi, no crackles, no use of accessory muscles Abdomen- bowel sounds present, soft, non tender Musculoskeletal- able to move all 4 extremities, generalized weakness more to lower extremities and can wheel herself on wheelchair Neurological- alert and oriented to person and place but not to time Skin- warm and dry    Labs reviewed: Basic Metabolic Panel:  Recent Labs  04/08/16 05/26/16 10/27/16  NA 142 142 142  K 3.8 4.1 4.1  BUN 22* 19 14  CREATININE 0.8 0.8 1.0   Liver Function Tests:  Recent Labs  05/26/16 10/27/16  AST 8* 10*  ALT 5* 5*  ALKPHOS 57 55   No results for input(s): LIPASE, AMYLASE in the last 8760 hours. No results for input(s): AMMONIA in the last 8760 hours. CBC:  Recent Labs  04/08/16 05/26/16 10/27/16  WBC 6.9 7.0 7.9  HGB 11.6* 10.9* 12.5  HCT 37 35* 39  PLT 214 252 250   Cardiac Enzymes: No results for input(s): CKTOTAL, CKMB, CKMBINDEX, TROPONINI in the last  8760 hours. BNP: Invalid input(s): POCBNP CBG: No results for input(s): GLUCAP in the last 8760 hours. Lipid Panel     Component Value Date/Time   CHOL 181 10/27/2016   TRIG 173 (A) 10/27/2016   HDL 44 10/27/2016   LDLCALC 103 10/27/2016     05/10/16 ct head- no acute intracranial abnormality, stable mild to moderate generalized atrophy and moderate chronic microvascular ischemic changes of white matter   Assessment/Plan  Hypertensive heart and renal disease Blood pressure reading has been low on review. Currently on Cozaar 25 mg daily, Lasix 20 mg daily and diltiazem 240 mg daily. With systolic blood pressure and upper 80s and low 96V and diastolic blood pressure in 50s, discontinue Cozaar. Add holding parameters to diltiazem. Check blood pressure twice a day for 2 weeks to evaluate further  Atrial fibrillation Chronic, stable. Continue diltiazem 240 mg daily for rate control but add holding parameters given soft blood pressure reading. Continue ELiquis 5 mg twice a day for stroke prophylaxis.  breast cancer Continue aromasin on a daily basis and monitor  Chronic kidney disease stage III Reviewed BMP. Monitor clinically. Discontinuing Cozaar for now given the soft blood pressure reading.  Hyperammonemia Reportedly patient has change in mental status and on lab work has elevated ammonia level in both responds well to lactulose. On lab review, her liver function tests are normal. Reviewed CT scan of the abdomen from 05/03/13 showing diffuse hepatic steatosis without focal hepatic parenchymal abnormality. Also has history of cholelithiasis without cholecystitis. Will get ultrasound of the liver to evaluate further. Continue lactulose for now    [facility], MD Internal Medicine [facility] [place], [place] [address] Cell Phone (Monday-Friday 8 am - 5 pm): [phone] On Call: [phone] and follow prompts after 5 pm and on  weekends Office Phone: [phone] Office Fax: [phone]

## 2017-02-21 LAB — LIPID PANEL
Cholesterol: 148 mg/dL (ref 0–200)
HDL: 40 mg/dL (ref 35–70)
LDL CALC: 85 mg/dL
Triglycerides: 11 mg/dL — AB (ref 40–160)
Triglycerides: 116 mg/dL (ref 40–160)

## 2017-02-21 LAB — MICROALBUMIN, URINE: Microalb, Ur: 29.2

## 2017-02-21 LAB — HEPATIC FUNCTION PANEL
ALT: 3 U/L — AB (ref 7–35)
AST: 8 U/L — AB (ref 13–35)
Alkaline Phosphatase: 51 U/L (ref 25–125)
BILIRUBIN, TOTAL: 0.6 mg/dL

## 2017-02-21 LAB — BASIC METABOLIC PANEL
BUN: 20 mg/dL (ref 4–21)
CREATININE: 1 mg/dL (ref 0.5–1.1)
Glucose: 102 mg/dL
Potassium: 4 mmol/L (ref 3.4–5.3)
Sodium: 143 mmol/L (ref 137–147)

## 2017-02-21 LAB — TSH: TSH: 4.59 u[IU]/mL (ref 0.41–5.90)

## 2017-02-21 LAB — HEMOGLOBIN A1C: HEMOGLOBIN A1C: 5.7

## 2017-03-29 ENCOUNTER — Encounter: Payer: Self-pay | Admitting: Internal Medicine

## 2017-03-29 ENCOUNTER — Non-Acute Institutional Stay (SKILLED_NURSING_FACILITY): Payer: Medicare Other | Admitting: Internal Medicine

## 2017-03-29 DIAGNOSIS — E538 Deficiency of other specified B group vitamins: Secondary | ICD-10-CM | POA: Diagnosis not present

## 2017-03-29 DIAGNOSIS — Z7901 Long term (current) use of anticoagulants: Secondary | ICD-10-CM

## 2017-03-29 DIAGNOSIS — F0280 Dementia in other diseases classified elsewhere without behavioral disturbance: Secondary | ICD-10-CM | POA: Diagnosis not present

## 2017-03-29 DIAGNOSIS — G309 Alzheimer's disease, unspecified: Secondary | ICD-10-CM | POA: Diagnosis not present

## 2017-03-29 NOTE — Progress Notes (Signed)
Patient ID: [name], female   DOB: May 15, 1941, 76 y.o.   MRN: [id number]     [facility]   PCP: Blanchie Serve, MD  Code Status: DNR  Allergies  Allergen Reactions  . Aspirin Other (See Comments)    Avoids due to being on blood thinners  . Ibuprofen Other (See Comments)    Avoids due to being on blood thinners  . Contrast Media [Iodinated Diagnostic Agents] Nausea And Vomiting  . Pravachol Other (See Comments)    Muscle pain    Chief Complaint  Patient presents with  . Medical Management of Chronic Issues    Routine Visit     Advanced Directives 02/17/2017  Does Patient Have a Medical Advance Directive? Yes  Type of Advance Directive Out of facility DNR (pink MOST or yellow form)  Does patient want to make changes to medical advance directive? No - Patient declined  Copy of [place] in Chart? -  Pre-existing out of facility DNR order (yellow form or pink MOST form) -    HPI:  76 y.o. patient is seen today for routine visit. No new concern from nursing staff.  Review of Systems: limited HPI and ROS Constitutional: Negative for fever  HENT: Negative for headache Respiratory: Negative for shortness of breath Cardiovascular: Negative for chest pain, palpitation Gastrointestinal: Negative for nausea, vomiting, abdominal pain. Genitourinary: Negative for dysuria.  Musculoskeletal: Negative for fall. She is on wheelchair.   Neurological: Negative for dizziness    Past Medical History:  Diagnosis Date  . Anemia   . Asthma   . Atrial fibrillation, persistent ([place]) 11/01/2013  . Breast cancer, stage 1 ([place]) 10/17/2011   s/p right lumpectomy and XRT  . Bronchitis   . Bruises easily   . Chills   . Chronic anticoagulation, on Eliquis 11/01/2013  . Chronic bronchitis ([place])   . Chronic diastolic CHF (congestive heart failure) ([place])   . Constipation   . COPD (chronic obstructive pulmonary disease) ([place])   .  Cough   . Dementia without behavioral disturbance   . Dyslipidemia   . Fracture of left lower leg 06/1972   "no surgery; just casted it"  . Generalized weakness   . Gout attack    ankle, then wrist and hands  . Gout without tophus   . Hernia   . History of nuclear stress test 02/09/2010   dipyridamole; normal pattern of perfusion in all regions with attenuation artifact in inferior region, low risk   . Hypertension   . Insomnia   . Morbid obesity ([place])   . On home oxygen therapy    "2L prn" (01/24/2014)  . OSA on CPAP   . Pneumonia 2012; 2014  . Protein calorie malnutrition ([place])   . Sarcoid   . Sarcoidosis   . Type II diabetes mellitus ([place])   . Uterine cancer [facility])    s/p hysterectomy  . Wheezing    Past Surgical History:  Procedure Laterality Date  . ABDOMINAL HYSTERECTOMY  02/04/2005  . BREAST BIOPSY Right 01/2011  . BREAST LUMPECTOMY Right 01/2011  . HERNIA REPAIR    . TOTAL KNEE ARTHROPLASTY Bilateral 03/2010; 06/2011   left; right  . TRANSTHORACIC ECHOCARDIOGRAM  07/2008   EF, LV size is normal; RVSP normal; mod calcif of MV apparatus  . TUBAL LIGATION  1970's  . UMBILICAL HERNIA REPAIR  02/04/2005   Social History:   reports that she has never smoked. She has never used  smokeless tobacco. She reports that she does not drink alcohol or use drugs.  Family History  Problem Relation Age of Onset  . Diabetes Mother   . Colon cancer Father   . Diabetes Brother   . Diabetes Sister   . Lung cancer Brother   . Cervical cancer Sister     Medications: Allergies as of 03/29/2017      Reactions   Aspirin Other (See Comments)   Avoids due to being on blood thinners   Ibuprofen Other (See Comments)   Avoids due to being on blood thinners   Contrast Media [iodinated Diagnostic Agents] Nausea And Vomiting   Pravachol Other (See Comments)   Muscle pain      Medication List       Accurate as of 03/29/17  2:24 PM. Always use your most recent med list.            albuterol (2.5 MG/3ML) 0.083% nebulizer solution Commonly known as:  PROVENTIL Take by nebulization every 6 (six) hours as needed for wheezing or shortness of breath.   apixaban 5 MG Tabs tablet Commonly known as:  ELIQUIS TAKE 1 TABLET (5 MG TOTAL) BY MOUTH 2 (TWO) TIMES DAILY.   Calcium Carb-Cholecalciferol 500-200 MG-UNIT Tabs Take 1 tablet by mouth 2 (two) times daily. Reported on 01/26/2016   diltiazem 240 MG 24 hr capsule Commonly known as:  CARDIZEM CD Take 240 mg by mouth every morning.   docusate sodium 100 MG capsule Commonly known as:  COLACE Take 100 mg by mouth daily as needed for mild constipation.   donepezil 10 MG tablet Commonly known as:  ARICEPT Take 10 mg by mouth at bedtime.   exemestane 25 MG tablet Commonly known as:  AROMASIN Take 25 mg by mouth daily after breakfast.   furosemide 20 MG tablet Commonly known as:  LASIX Take 20 mg by mouth daily.   hydrocerin Crea Apply 1 application topically daily as needed.   lactulose 10 GM/15ML solution Commonly known as:  CHRONULAC Take by mouth daily. Give 15 mL by mouth   metoCLOPramide 5 MG tablet Commonly known as:  REGLAN Take 5 mg by mouth 2 (two) times daily.   ondansetron 8 MG tablet Commonly known as:  ZOFRAN Take 8 mg by mouth every 8 (eight) hours as needed for nausea or vomiting.   OXYGEN Inhale 2 L/min into the lungs continuous.   SYMBICORT 160-4.5 MCG/ACT inhaler Generic drug:  budesonide-formoterol Inhale 2 puffs into the lungs 2 (two) times daily. Rinse mouth well after use   traZODone 50 MG tablet Commonly known as:  DESYREL Take 50 mg by mouth at bedtime.   vitamin B-12 1000 MCG tablet Commonly known as:  CYANOCOBALAMIN Take 1,000 mcg by mouth daily.        Physical Exam: Vitals:   03/29/17 1414  BP: 111/60  Pulse: 60  Resp: 18  Temp: (!) 96.8 F (36 C)  TempSrc: Oral  SpO2: 97%  Weight: 196 lb 6.4 oz (89.1 kg)  Height: 5\' 4"  (1.626 m)  Body mass index is  33.71 kg/m.  Wt Readings from Last 3 Encounters:  03/29/17 196 lb 6.4 oz (89.1 kg)  02/17/17 200 lb 12.8 oz (91.1 kg)  11/25/16 201 lb 12.8 oz (91.5 kg)   General- elderly female, obese, in no acute distress Head- normocephalic, atraumatic Eyes- no pallor, no icterus, no discharge Neck- no cervical lymphadenopathy Cardiovascular- irregular heart rate, no murmur, trace leg edema Respiratory- bilateral equal air entry Abdomen-  bowel sounds present, soft, non tender Musculoskeletal- able to move all 4 extremities, on wheelchair Neurological- alert and oriented to self Skin- warm and dry    Labs reviewed: Basic Metabolic Panel:  Recent Labs  05/26/16 10/27/16 02/21/17  NA 142 142 143  K 4.1 4.1 4.0  BUN 19 14 20   CREATININE 0.8 1.0 1.0   Liver Function Tests:  Recent Labs  05/26/16 10/27/16 02/21/17  AST 8* 10* 8*  ALT 5* 5* 3*  ALKPHOS 57 55 51   No results for input(s): LIPASE, AMYLASE in the last 8760 hours. No results for input(s): AMMONIA in the last 8760 hours. CBC:  Recent Labs  04/08/16 05/26/16 10/27/16  WBC 6.9 7.0 7.9  HGB 11.6* 10.9* 12.5  HCT 37 35* 39  PLT 214 252 250    Lipid Panel     Component Value Date/Time   CHOL 148 02/21/2017   TRIG 11 (A) 02/21/2017   TRIG 116 02/21/2017   HDL 40 02/21/2017   LDLCALC 85 02/21/2017   Microalbumin w/out creatinine, urine 02/21/17 29.2 mg/L  05/10/16 ct head- no acute intracranial abnormality, stable mild to moderate generalized atrophy and moderate chronic microvascular ischemic changes of white matter   Assessment/Plan  b12 deficiency Continue b12 1000 mcg daily  Alzheimer's Dementia Continue aricept 10 mg qhs, provide supportive care and monitor  Long term use of anticoagulation No bleed and fall reported, continue eliquis 5 mg bid and monitor   Blanchie Serve, MD Internal Medicine [facility] [place], [place] [address] Cell Phone  (Monday-Friday 8 am - 5 pm): [phone] On Call: [phone] and follow prompts after 5 pm and on weekends Office Phone: [phone] Office Fax: [phone]

## 2017-04-25 ENCOUNTER — Ambulatory Visit: Payer: Medicare Other | Admitting: Internal Medicine

## 2017-05-02 ENCOUNTER — Non-Acute Institutional Stay (SKILLED_NURSING_FACILITY): Payer: Medicare Other

## 2017-05-02 DIAGNOSIS — Z0000 Encounter for general adult medical examination without abnormal findings: Secondary | ICD-10-CM

## 2017-05-02 NOTE — Progress Notes (Signed)
Quick Notes   Health Maintenance: PNA 23, ordered.     Abnormal Screen: None     Patient Concerns: None      Nurse Concerns: none

## 2017-05-02 NOTE — Progress Notes (Signed)
Quick Notes   Health Maintenance: PNA 23, ordered     Abnormal Screen: None     Patient Concerns: None     Nurse Concerns: None

## 2017-05-02 NOTE — Patient Instructions (Signed)
[name] , Thank you for taking time to come for your Medicare Wellness Visit. I appreciate your ongoing commitment to your health goals. Please review the following plan we discussed and let me know if I can assist you in the future.   Screening recommendations/referrals: Colonoscopy up to date, pt long term Mammogram up to date, pt long term Bone Density up to date Recommended yearly ophthalmology/optometry visit for glaucoma screening and checkup Recommended yearly dental visit for hygiene and checkup  Vaccinations: Influenza vaccine due 09/05/2017 Pneumococcal vaccine 23 due, ordered Tdap vaccine not in records Shingles vaccine not in records  Advanced directives: Need copy for chart  Conditions/risks identified: none  Next appointment: none upcoming   Preventive Care 4 Years and Older, Female Preventive care refers to lifestyle choices and visits with your health care provider that can promote health and wellness. What does preventive care include?  A yearly physical exam. This is also called an annual well check.  Dental exams once or twice a year.  Routine eye exams. Ask your health care provider how often you should have your eyes checked.  Personal lifestyle choices, including:  Daily care of your teeth and gums.  Regular physical activity.  Eating a healthy diet.  Avoiding tobacco and drug use.  Limiting alcohol use.  Practicing safe sex.  Taking low-dose aspirin every day.  Taking vitamin and mineral supplements as recommended by your health care provider. What happens during an annual well check? The services and screenings done by your health care provider during your annual well check will depend on your age, overall health, lifestyle risk factors, and family history of disease. Counseling  Your health care provider may ask you questions about your:  Alcohol use.  Tobacco use.  Drug use.  Emotional well-being.  Home and relationship  well-being.  Sexual activity.  Eating habits.  History of falls.  Memory and ability to understand (cognition).  Work and work Statistician.  Reproductive health. Screening  You may have the following tests or measurements:  Height, weight, and BMI.  Blood pressure.  Lipid and cholesterol levels. These may be checked every 5 years, or more frequently if you are over 83 years old.  Skin check.  Lung cancer screening. You may have this screening every year starting at age 62 if you have a 30-pack-year history of smoking and currently smoke or have quit within the past 15 years.  Fecal occult blood test (FOBT) of the stool. You may have this test every year starting at age 38.  Flexible sigmoidoscopy or colonoscopy. You may have a sigmoidoscopy every 5 years or a colonoscopy every 10 years starting at age 17.  Hepatitis C blood test.  Hepatitis B blood test.  Sexually transmitted disease (STD) testing.  Diabetes screening. This is done by checking your blood sugar (glucose) after you have not eaten for a while (fasting). You may have this done every 1-3 years.  Bone density scan. This is done to screen for osteoporosis. You may have this done starting at age 28.  Mammogram. This may be done every 1-2 years. Talk to your health care provider about how often you should have regular mammograms. Talk with your health care provider about your test results, treatment options, and if necessary, the need for more tests. Vaccines  Your health care provider may recommend certain vaccines, such as:  Influenza vaccine. This is recommended every year.  Tetanus, diphtheria, and acellular pertussis (Tdap, Td) vaccine. You may need a Td  booster every 10 years.  Zoster vaccine. You may need this after age 55.  Pneumococcal 13-valent conjugate (PCV13) vaccine. One dose is recommended after age 47.  Pneumococcal polysaccharide (PPSV23) vaccine. One dose is recommended after age  39. Talk to your health care provider about which screenings and vaccines you need and how often you need them. This information is not intended to replace advice given to you by your health care provider. Make sure you discuss any questions you have with your health care provider. Document Released: 12/25/2015 Document Revised: 08/17/2016 Document Reviewed: 09/29/2015 Elsevier Interactive Patient Education  2017 [place] Prevention in the Home Falls can cause injuries. They can happen to people of all ages. There are many things you can do to make your home safe and to help prevent falls. What can I do on the outside of my home?  Regularly fix the edges of walkways and driveways and fix any cracks.  Remove anything that might make you trip as you walk through a door, such as a raised step or threshold.  Trim any bushes or trees on the path to your home.  Use bright outdoor lighting.  Clear any walking paths of anything that might make someone trip, such as rocks or tools.  Regularly check to see if handrails are loose or broken. Make sure that both sides of any steps have handrails.  Any raised decks and porches should have guardrails on the edges.  Have any leaves, snow, or ice cleared regularly.  Use sand or salt on walking paths during winter.  Clean up any spills in your garage right away. This includes oil or grease spills. What can I do in the bathroom?  Use night lights.  Install grab bars by the toilet and in the tub and shower. Do not use towel bars as grab bars.  Use non-skid mats or decals in the tub or shower.  If you need to sit down in the shower, use a plastic, non-slip stool.  Keep the floor dry. Clean up any water that spills on the floor as soon as it happens.  Remove soap buildup in the tub or shower regularly.  Attach bath mats securely with double-sided non-slip rug tape.  Do not have throw rugs and other things on the floor that can make  you trip. What can I do in the bedroom?  Use night lights.  Make sure that you have a light by your bed that is easy to reach.  Do not use any sheets or blankets that are too big for your bed. They should not hang down onto the floor.  Have a firm chair that has side arms. You can use this for support while you get dressed.  Do not have throw rugs and other things on the floor that can make you trip. What can I do in the kitchen?  Clean up any spills right away.  Avoid walking on wet floors.  Keep items that you use a lot in easy-to-reach places.  If you need to reach something above you, use a strong step stool that has a grab bar.  Keep electrical cords out of the way.  Do not use floor polish or wax that makes floors slippery. If you must use wax, use non-skid floor wax.  Do not have throw rugs and other things on the floor that can make you trip. What can I do with my stairs?  Do not leave any items on the stairs.  Make sure that there are handrails on both sides of the stairs and use them. Fix handrails that are broken or loose. Make sure that handrails are as long as the stairways.  Check any carpeting to make sure that it is firmly attached to the stairs. Fix any carpet that is loose or worn.  Avoid having throw rugs at the top or bottom of the stairs. If you do have throw rugs, attach them to the floor with carpet tape.  Make sure that you have a light switch at the top of the stairs and the bottom of the stairs. If you do not have them, ask someone to add them for you. What else can I do to help prevent falls?  Wear shoes that:  Do not have high heels.  Have rubber bottoms.  Are comfortable and fit you well.  Are closed at the toe. Do not wear sandals.  If you use a stepladder:  Make sure that it is fully opened. Do not climb a closed stepladder.  Make sure that both sides of the stepladder are locked into place.  Ask someone to hold it for you, if  possible.  Clearly mark and make sure that you can see:  Any grab bars or handrails.  First and last steps.  Where the edge of each step is.  Use tools that help you move around (mobility aids) if they are needed. These include:  Canes.  Walkers.  Scooters.  Crutches.  Turn on the lights when you go into a dark area. Replace any light bulbs as soon as they burn out.  Set up your furniture so you have a clear path. Avoid moving your furniture around.  If any of your floors are uneven, fix them.  If there are any pets around you, be aware of where they are.  Review your medicines with your doctor. Some medicines can make you feel dizzy. This can increase your chance of falling. Ask your doctor what other things that you can do to help prevent falls. This information is not intended to replace advice given to you by your health care provider. Make sure you discuss any questions you have with your health care provider. Document Released: 09/24/2009 Document Revised: 05/05/2016 Document Reviewed: 01/02/2015 Elsevier Interactive Patient Education  2017 [employer].

## 2017-05-02 NOTE — Progress Notes (Signed)
Subjective:   [name] is a 76 y.o. female who presents for an Initial Medicare Annual Wellness Visit at [place].        Objective:    Today's Vitals   05/02/17 1054  BP: 102/64  Pulse: 61  Temp: 97.3 F (36.3 C)  TempSrc: Oral  SpO2: 98%  Weight: 196 lb (88.9 kg)  Height: 5\' 4"  (1.626 m)   Body mass index is 33.64 kg/m.   Current Medications (verified) Outpatient Encounter Prescriptions as of 05/02/2017  Medication Sig  . albuterol (PROVENTIL) (2.5 MG/3ML) 0.083% nebulizer solution Take by nebulization every 6 (six) hours as needed for wheezing or shortness of breath.  Marland Kitchen apixaban (ELIQUIS) 5 MG TABS tablet TAKE 1 TABLET (5 MG TOTAL) BY MOUTH 2 (TWO) TIMES DAILY.  . Calcium Carb-Cholecalciferol 500-200 MG-UNIT TABS Take 1 tablet by mouth 2 (two) times daily. Reported on 01/26/2016  . diltiazem (CARDIZEM CD) 240 MG 24 hr capsule Take 240 mg by mouth every morning.   . docusate sodium (COLACE) 100 MG capsule Take 100 mg by mouth daily as needed for mild constipation.  Marland Kitchen donepezil (ARICEPT) 10 MG tablet Take 10 mg by mouth at bedtime.  Marland Kitchen exemestane (AROMASIN) 25 MG tablet Take 25 mg by mouth daily after breakfast.  . furosemide (LASIX) 20 MG tablet Take 20 mg by mouth daily.  . hydrocerin (EUCERIN) CREA Apply 1 application topically daily as needed.   . lactulose (CHRONULAC) 10 GM/15ML solution Take by mouth daily. Give 15 mL by mouth  . metoCLOPramide (REGLAN) 5 MG tablet Take 5 mg by mouth 2 (two) times daily.  . ondansetron (ZOFRAN) 8 MG tablet Take 8 mg by mouth every 8 (eight) hours as needed for nausea or vomiting.  . OXYGEN Inhale 2 L/min into the lungs continuous.   . SYMBICORT 160-4.5 MCG/ACT inhaler Inhale 2 puffs into the lungs 2 (two) times daily. Rinse mouth well after use  . traZODone (DESYREL) 50 MG tablet Take 50 mg by mouth at bedtime.  . vitamin B-12 (CYANOCOBALAMIN) 1000 MCG tablet Take 1,000 mcg by mouth daily.   No  facility-administered encounter medications on file as of 05/02/2017.     Allergies (verified) Aspirin; Ibuprofen; Contrast media [iodinated diagnostic agents]; and Pravachol   History: Past Medical History:  Diagnosis Date  . Anemia   . Asthma   . Atrial fibrillation, persistent ([place]) 11/01/2013  . Breast cancer, stage 1 ([place]) 10/17/2011   s/p right lumpectomy and XRT  . Bronchitis   . Bruises easily   . Chills   . Chronic anticoagulation, on Eliquis 11/01/2013  . Chronic bronchitis ([place])   . Chronic diastolic CHF (congestive heart failure) ([place])   . Constipation   . COPD (chronic obstructive pulmonary disease) ([place])   . Cough   . Dementia without behavioral disturbance   . Dyslipidemia   . Fracture of left lower leg 06/1972   "no surgery; just casted it"  . Generalized weakness   . Gout attack    ankle, then wrist and hands  . Gout without tophus   . Hernia   . History of nuclear stress test 02/09/2010   dipyridamole; normal pattern of perfusion in all regions with attenuation artifact in inferior region, low risk   . Hypertension   . Insomnia   . Morbid obesity ([place])   . On home oxygen therapy    "2L prn" (01/24/2014)  . OSA on CPAP   . Pneumonia 2012;  2014  . Protein calorie malnutrition ([place])   . Sarcoid   . Sarcoidosis   . Type II diabetes mellitus ([place])   . Uterine cancer [facility])    s/p hysterectomy  . Wheezing    Past Surgical History:  Procedure Laterality Date  . ABDOMINAL HYSTERECTOMY  02/04/2005  . BREAST BIOPSY Right 01/2011  . BREAST LUMPECTOMY Right 01/2011  . HERNIA REPAIR    . TOTAL KNEE ARTHROPLASTY Bilateral 03/2010; 06/2011   left; right  . TRANSTHORACIC ECHOCARDIOGRAM  07/2008   EF, LV size is normal; RVSP normal; mod calcif of MV apparatus  . TUBAL LIGATION  1970's  . UMBILICAL HERNIA REPAIR  02/04/2005   Family History  Problem Relation Age of Onset  . Diabetes Mother   . Colon cancer Father   . Diabetes Brother   . Diabetes Sister   . Lung  cancer Brother   . Cervical cancer Sister    Social History   Occupational History  . RETIRED Retired    Scientist, water quality   Social History Main Topics  . Smoking status: Never Smoker  . Smokeless tobacco: Never Used  . Alcohol use No  . Drug use: No  . Sexual activity: No    Tobacco Counseling Counseling given: Not Answered   Activities of Daily Living In your present state of health, do you have any difficulty performing the following activities: 05/02/2017  Hearing? N  Vision? Y  Difficulty concentrating or making decisions? Y  Walking or climbing stairs? Y  Dressing or bathing? Y  Doing errands, shopping? Y  Preparing Food and eating ? Y  Using the Toilet? Y  In the past six months, have you accidently leaked urine? N  Do you have problems with loss of bowel control? N  Managing your Medications? Y  Managing your Finances? Y  Housekeeping or managing your Housekeeping? Y  Some recent data might be hidden    Immunizations and Health Maintenance Immunization History  Administered Date(s) Administered  . Influenza Split 09/22/2011, 09/02/2012  . Influenza,inj,Quad PF,36+ Mos 08/22/2013, 09/15/2015  . Influenza-Unspecified 09/05/2016  . PPD Test 12/24/2015   There are no preventive care reminders to display for this patient.  Patient Care Team: Blanchie Serve, MD as PCP - General (Internal Medicine)  Indicate any recent Medical Services you may have received from other than [facility] providers in the past year (date may be approximate).     Assessment:   This is a routine wellness examination for [name].   Hearing/Vision screen No exam data present  Dietary issues and exercise activities discussed: Current Exercise Habits: The patient does not participate in regular exercise at present, Exercise limited by: respiratory conditions(s)  Goals    . Wean off O2          Patient would like to be able to not be on 2L O2 24/7      Depression Screen PHQ 2/9 Scores  05/02/2017  PHQ - 2 Score 0    Fall Risk Fall Risk  05/02/2017  Falls in the past year? No    Cognitive Function:     6CIT Screen 05/02/2017  What Year? 0 points  What month? 0 points  What time? 0 points  Count back from 20 0 points  Months in reverse 0 points  Repeat phrase 0 points  Total Score 0    Screening Tests Health Maintenance  Topic Date Due  . TETANUS/TDAP  12/12/2018 (Originally 09/26/1960)  . FOOT EXAM  06/29/2017  .  INFLUENZA VACCINE  07/12/2017  . OPHTHALMOLOGY EXAM  08/09/2017  . HEMOGLOBIN A1C  08/24/2017  . PNA vac Low Risk Adult (2 of 2 - PPSV23) 09/05/2017  . URINE MICROALBUMIN  02/21/2018  . DEXA SCAN  Completed      Plan:    I have personally reviewed and addressed the Medicare Annual Wellness questionnaire and have noted the following in the patient's chart:  A. Medical and social history B. Use of alcohol, tobacco or illicit drugs  C. Current medications and supplements D. Functional ability and status E.  Nutritional status F.  Physical activity G. Advance directives H. List of other physicians I.  Hospitalizations, surgeries, and ER visits in previous 12 months J.  [place] to include hearing, vision, cognitive, depression L. Referrals and appointments - none  In addition, I have reviewed and discussed with patient certain preventive protocols, quality metrics, and best practice recommendations. A written personalized care plan for preventive services as well as general preventive health recommendations were provided to patient.  See attached scanned questionnaire for additional information.   Signed,   Rich Reining, RN Nurse Health Advisor    I agree with above and was available to answer medical question/ concerns from [place].

## 2017-05-05 ENCOUNTER — Telehealth: Payer: Self-pay

## 2017-05-05 NOTE — Telephone Encounter (Signed)
Pt son called to schedule an appt follow with Dr.Gudena soon. Pt son states that she had completed 1yrs of aromasin and per Dr.Ingram, pt will need to follow up with Dr.Gudena prior to discontinuing medication. Pt is now living in a nursing facility d/t worsening dementia. Pt son is the poa and will be present at time of appt. Scheduled pt to see Dr.Gudena on 05/19/17. Pt son verbally confirmed time/date.

## 2017-05-10 ENCOUNTER — Ambulatory Visit (INDEPENDENT_AMBULATORY_CARE_PROVIDER_SITE_OTHER): Payer: Medicare Other | Admitting: Internal Medicine

## 2017-05-10 ENCOUNTER — Encounter: Payer: Self-pay | Admitting: Internal Medicine

## 2017-05-10 VITALS — BP 122/64 | HR 54 | Ht 60.0 in | Wt 196.0 lb

## 2017-05-10 DIAGNOSIS — I482 Chronic atrial fibrillation, unspecified: Secondary | ICD-10-CM

## 2017-05-10 DIAGNOSIS — R609 Edema, unspecified: Secondary | ICD-10-CM | POA: Diagnosis not present

## 2017-05-10 DIAGNOSIS — I272 Pulmonary hypertension, unspecified: Secondary | ICD-10-CM | POA: Diagnosis not present

## 2017-05-10 DIAGNOSIS — Z7901 Long term (current) use of anticoagulants: Secondary | ICD-10-CM

## 2017-05-10 DIAGNOSIS — R600 Localized edema: Secondary | ICD-10-CM

## 2017-05-10 NOTE — Progress Notes (Signed)
OFFICE NOTE  Chief Complaint:  No complaints  Primary Care Physician: Blanchie Serve, MD  HPI:  [name] is a 76 year old female who is morbidly obese and recently had bilateral knee replacement. She also has a history of dyslipidemia, hypertension, sleep apnea, diabetes type 2, sarcoidosis, paroxysmal A-fib on Eliquis.  Unfortunately, she also had breast cancer which I understand she is now in remission for. She was recently admitted to the hospital from skilled nursing facility as she was found to be bradycardic with HR in the 40s. Per pt, this has been associated with dyspnea on exertion and at rest, 2-3 pillow orthopnea, progressively worsening LE swelling. Her pulmonologist increased the dose of Lasix to 40 mg BID but she has not started this piror to this admission. She was found to be in acute diastolic heart failure and was diureses. Her breathing had improved however she was having persistent cough and she was on an ACE inhibitor which was changed to an ARB. She improved fairly quickly with diuresis and was discharged with a weight of 244 pounds. Her weight on hospital follow-up was 236 pounds.  She has chronic dyspnea which is not any worse than it was when she was discharged.  Was recently contacted by home health who noted that she's had progressive increase in her weight over the past 2 months. Specifically, over the last week she's had an increase in her weight now up to 260 pounds. This is associated with marked increase in shortness of breath and abdominal fullness with difficulty breathing. She's had progressively increasing doses of Lasix were divided over the past week including increased to 80 mg in the morning and 40 mg at night and most recently 80 mg twice daily, however she did not report an increase in urination and her weight continues to increase.  [name] was recently hospitalized for  Atypical chest pain and a number of other complaints. She was in A. fib  with RVR. Cardiology was consult and no further workup was recommended. Her diuretics were adjusted and currently she is on 40 mg daily of Lasix. Weight is stable compared to her hospital discharge. Blood pressure is well-controlled today. She appears essentially wheelchair-bound. She is in a nursing home.  04/25/2016  [name] returns today for follow-up. She is now at [facility] ([place]). She appears to have declined somewhat since her last office visit. She is now essentially wheelchair-bound. She appears pale although recent laboratory work shows no evidence of anemia. In March her hemoglobin A1c was 4.8, H&H of 11 and 37, creatinine of 0.93. Weight today was 232 pounds at the nursing home but she was unable to be weighed here because she's in her wheelchair. She does carry a diagnosis of dementia and depression. She seemed quite flat today. She has significant hearing loss. She denies any chest pain or shortness of breath which is worse when she is off of her oxygen which is 2 L continuous. Her atrial fibrillation is rate controlled and she is on Eliquis 5 mg twice a day as well as diltiazem.  05/10/2017  [name] returns today for follow-up. She continues to be a [employer]. She is accompanied by her son today. She's had some worsening dementia. Amazingly she's had about 45 pound weight loss since I last saw her. Is is attributed to dietary changes. Apparently they've been working with a nutritionist. She's had some reduction in blood pressure medications and actually has a very normal blood pressure today  122/64. There are no acute complaints. She remains on continuous oxygen but was not wearing it today. There is no concern for any GI bleeding on Eliquis. She remains in persistent atrial fibrillation which is likely permanent at this point.  PMHx:  Past Medical History:  Diagnosis Date  . Anemia   . Asthma   . Atrial fibrillation, persistent ([place]) 11/01/2013  . Breast cancer, stage 1 ([place])  10/17/2011   s/p right lumpectomy and XRT  . Bronchitis   . Bruises easily   . Chills   . Chronic anticoagulation, on Eliquis 11/01/2013  . Chronic bronchitis ([place])   . Chronic diastolic CHF (congestive heart failure) ([place])   . Constipation   . COPD (chronic obstructive pulmonary disease) ([place])   . Cough   . Dementia without behavioral disturbance   . Dyslipidemia   . Fracture of left lower leg 06/1972   "no surgery; just casted it"  . Generalized weakness   . Gout attack    ankle, then wrist and hands  . Gout without tophus   . Hernia   . History of nuclear stress test 02/09/2010   dipyridamole; normal pattern of perfusion in all regions with attenuation artifact in inferior region, low risk   . Hypertension   . Insomnia   . Morbid obesity ([place])   . On home oxygen therapy    "2L prn" (01/24/2014)  . OSA on CPAP   . Pneumonia 2012; 2014  . Protein calorie malnutrition ([place])   . Sarcoid   . Sarcoidosis   . Type II diabetes mellitus ([place])   . Uterine cancer [facility])    s/p hysterectomy  . Wheezing     Past Surgical History:  Procedure Laterality Date  . ABDOMINAL HYSTERECTOMY  02/04/2005  . BREAST BIOPSY Right 01/2011  . BREAST LUMPECTOMY Right 01/2011  . HERNIA REPAIR    . TOTAL KNEE ARTHROPLASTY Bilateral 03/2010; 06/2011   left; right  . TRANSTHORACIC ECHOCARDIOGRAM  07/2008   EF, LV size is normal; RVSP normal; mod calcif of MV apparatus  . TUBAL LIGATION  1970's  . UMBILICAL HERNIA REPAIR  02/04/2005    FAMHx:  Family History  Problem Relation Age of Onset  . Diabetes Mother   . Colon cancer Father   . Diabetes Brother   . Diabetes Sister   . Lung cancer Brother   . Cervical cancer Sister     SOCHx:   reports that she has never smoked. She has never used smokeless tobacco. She reports that she does not drink alcohol or use drugs.  ALLERGIES:  Allergies  Allergen Reactions  . Aspirin Other (See Comments)    Avoids due to being on blood thinners  . Ibuprofen  Other (See Comments)    Avoids due to being on blood thinners  . Contrast Media [Iodinated Diagnostic Agents] Nausea And Vomiting  . Pravachol Other (See Comments)    Muscle pain    ROS: A comprehensive review of systems was negative.  HOME MEDS: Current Outpatient Prescriptions  Medication Sig Dispense Refill  . albuterol (PROVENTIL) (2.5 MG/3ML) 0.083% nebulizer solution Take by nebulization every 6 (six) hours as needed for wheezing or shortness of breath.    Marland Kitchen apixaban (ELIQUIS) 5 MG TABS tablet TAKE 1 TABLET (5 MG TOTAL) BY MOUTH 2 (TWO) TIMES DAILY. 60 tablet 5  . Calcium Carb-Cholecalciferol 500-200 MG-UNIT TABS Take 1 tablet by mouth 2 (two) times daily. Reported on 01/26/2016    . diltiazem (CARDIZEM CD) 240  MG 24 hr capsule Take 240 mg by mouth every morning.     . docusate sodium (COLACE) 100 MG capsule Take 100 mg by mouth daily as needed for mild constipation.    Marland Kitchen donepezil (ARICEPT) 10 MG tablet Take 10 mg by mouth at bedtime.    Marland Kitchen exemestane (AROMASIN) 25 MG tablet Take 25 mg by mouth daily after breakfast.    . furosemide (LASIX) 20 MG tablet Take 20 mg by mouth daily.    . hydrocerin (EUCERIN) CREA Apply 1 application topically daily as needed.     . lactulose (CHRONULAC) 10 GM/15ML solution Take by mouth daily. Give 15 mL by mouth    . metoCLOPramide (REGLAN) 5 MG tablet Take 5 mg by mouth 2 (two) times daily.    . ondansetron (ZOFRAN) 8 MG tablet Take 8 mg by mouth every 8 (eight) hours as needed for nausea or vomiting.    . OXYGEN Inhale 2 L/min into the lungs continuous.     . SYMBICORT 160-4.5 MCG/ACT inhaler Inhale 2 puffs into the lungs 2 (two) times daily. Rinse mouth well after use    . traZODone (DESYREL) 50 MG tablet Take 50 mg by mouth at bedtime.    . vitamin B-12 (CYANOCOBALAMIN) 1000 MCG tablet Take 1,000 mcg by mouth daily.     No current facility-administered medications for this visit.     LABS/IMAGING: No results found for this or any previous  visit (from the past 48 hour(s)). No results found.  VITALS: Ht 5' (1.524 m)   Wt 196 lb (88.9 kg)   BMI 38.28 kg/m   EXAM: General appearance: alert, mild distress, morbidly obese and shallow breathing Neck: no carotid bruit and no JVD Lungs: diminished breath sounds bilaterally Heart: irregularly irregular rhythm Abdomen: soft, non-tender; bowel sounds normal; no masses,  no organomegaly Extremities: Right lower extremity edema Pulses: 2+ and symmetric Skin: Skin color, texture, turgor normal. No rashes or lesions Neurologic: Grossly normal Psych: Flat affect  EKG: Atrial fibrillation with slow ventricular response of 54  ASSESSMENT: 1. Chronic diastolic congestive heart failure, NYHA Class III symptoms 2. Persistent atrial fibrillation on Eliquis 3. Hypertension 4. Diabetes type 2 - on insulin 5. Morbid obesity 6. Obstructive sleep apnea 7. Sarcoidosis 8. Pulmonary hypertension on chronic 2 L nasal cannula oxygen 9. Dyslipidemia 10. Wheelchair-bound 11. Depression/dementia  PLAN: 1.   Mrs. Dittmer has had significant weight loss of over 40 pounds in last year. There is no evidence of decompensated heart failure. She has persistent A. fib which is likely permanent now on Eliquis. She is pulmonary hypertension but is not wearing oxygen today. A. fib is rate controlled. She would benefit from a compression stocking which is worn in the past but was not fitted today. I advised using that on the right lower extremity as she does have asymmetric lower extremity edema. It's unlikely this would be related to DVT as she is anticoagulated on Eliquis.  Follow up annually or sooner as necessary.  Pixie Casino, MD, [facility] Attending Cardiologist [place] 05/10/2017, 3:44 PM

## 2017-05-10 NOTE — Patient Instructions (Signed)
Medication Instructions:  Your physician recommends that you continue on your current medications as directed. Please refer to the Current Medication list given to you today.  Follow-Up: Your physician wants you to follow-up in: 1 year with Dr. Hilty.  You will receive a reminder letter in the mail two months in advance. If you don't receive a letter, please call our office to schedule the follow-up appointment.   Any Other Special Instructions Will Be Listed Below (If Applicable).     If you need a refill on your cardiac medications before your next appointment, please call your pharmacy.   

## 2017-05-11 NOTE — Addendum Note (Signed)
Addended by: Waylan Rocher on: 05/11/2017 09:46 AM   Modules accepted: Orders

## 2017-05-19 ENCOUNTER — Encounter: Payer: Self-pay | Admitting: Hematology and Oncology

## 2017-05-19 ENCOUNTER — Ambulatory Visit (HOSPITAL_BASED_OUTPATIENT_CLINIC_OR_DEPARTMENT_OTHER): Payer: Medicare Other | Admitting: Hematology and Oncology

## 2017-05-19 DIAGNOSIS — Z170 Estrogen receptor positive status [ER+]: Secondary | ICD-10-CM

## 2017-05-19 DIAGNOSIS — C50311 Malignant neoplasm of lower-inner quadrant of right female breast: Secondary | ICD-10-CM | POA: Diagnosis not present

## 2017-05-19 NOTE — Assessment & Plan Note (Signed)
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 And Aromasin 25 mg daily .   Surveillance: 1. Mammograms 04/20/2017 at [facility]: Benign, breast density category B 2. breast exam 05/19/2017: No palpable lumps or nodules  Aromasin toxicities: Tolerated it extremely well. Since she had completed 5 years of therapy, we discussed the pros and cons of extended adjuvant therapy and determined that she does not need to continue beyond 5 years.  Patient will be seen on an as-needed basis.

## 2017-05-19 NOTE — Progress Notes (Signed)
Patient Care Team: Blanchie Serve, MD as PCP - General (Internal Medicine)  DIAGNOSIS:  Encounter Diagnosis  Name Primary?  . Malignant neoplasm of lower-inner quadrant of right breast of female, estrogen receptor positive ([place])     SUMMARY OF ONCOLOGIC HISTORY:   Breast cancer of lower-inner quadrant of right female breast ([place])   12/28/2010 Initial Diagnosis    Right breast needle biopsy: Invasive mammary cancer with extravasated mucin grade 1 invasive ductal carcinoma, ER 100%, PR present, Ki-67 80%, HER-2 negative ratio 1.06      02/08/2011 Surgery    Right breast lumpectomy: Benign breast parenchyma with fibroadenomatoid nodules, 2 SLN negative, based on biopsy size tumor size 0.8 and 0.6 cm      03/28/2011 - 04/25/2011 Radiation Therapy    Adjuvant radiation therapy to the breast      06/03/2011 -  Anti-estrogen oral therapy     Aromasin 25 mg daily: For osteoporosis patient is taking Fosamax       CHIEF COMPLIANT: Follow-up on Aromasin therapy  INTERVAL HISTORY: [name] is a 76 year old with above-mentioned history of right breast cancer treated with lumpectomy radiation and had been on Aromasin therapy for the past 6 years. She was not seen by [place] for the past couple of years because of advanced Alzheimer's disease and wheelchair-bound. She however had been taking the Aromasin. She was seen by Dr. Dalbert Batman who suggested that the patient see [place] back to discuss the pros and cons of Aromasin therapy after 5 years. She does not answer any questions.  REVIEW OF SYSTEMS:   Constitutional: Denies fevers, chills or abnormal weight loss Eyes: Denies blurriness of vision Ears, nose, mouth, throat, and face: Denies mucositis or sore throat Respiratory: Denies cough, dyspnea or wheezes Cardiovascular: Denies palpitation, chest discomfort Gastrointestinal:  Denies nausea, heartburn or change in bowel habits Skin: Denies abnormal skin rashes Lymphatics: Denies new  lymphadenopathy or easy bruising Neurological: Generalized weakness wheelchair-bound Behavioral/Psych: Dementia  Extremities: No lower extremity edema Breast:  denies any pain or lumps or nodules in either breasts All other systems were reviewed with the patient and are negative.  I have reviewed the past medical history, past surgical history, social history and family history with the patient and they are unchanged from previous note.  ALLERGIES:  is allergic to aspirin; ibuprofen; contrast media [iodinated diagnostic agents]; and pravachol.  MEDICATIONS:  Current Outpatient Prescriptions  Medication Sig Dispense Refill  . albuterol (PROVENTIL) (2.5 MG/3ML) 0.083% nebulizer solution Take by nebulization every 6 (six) hours as needed for wheezing or shortness of breath.    Marland Kitchen apixaban (ELIQUIS) 5 MG TABS tablet TAKE 1 TABLET (5 MG TOTAL) BY MOUTH 2 (TWO) TIMES DAILY. 60 tablet 5  . Calcium Carb-Cholecalciferol 500-200 MG-UNIT TABS Take 1 tablet by mouth 2 (two) times daily. Reported on 01/26/2016    . diltiazem (CARDIZEM CD) 240 MG 24 hr capsule Take 240 mg by mouth every morning.     . docusate sodium (COLACE) 100 MG capsule Take 100 mg by mouth daily as needed for mild constipation.    Marland Kitchen donepezil (ARICEPT) 10 MG tablet Take 10 mg by mouth at bedtime.    Marland Kitchen exemestane (AROMASIN) 25 MG tablet Take 25 mg by mouth daily after breakfast.    . furosemide (LASIX) 20 MG tablet Take 20 mg by mouth daily.    . hydrocerin (EUCERIN) CREA Apply 1 application topically daily as needed.     . lactulose (CHRONULAC) 10 GM/15ML solution Take  by mouth daily. Give 15 mL by mouth    . metoCLOPramide (REGLAN) 5 MG tablet Take 5 mg by mouth 2 (two) times daily.    . ondansetron (ZOFRAN) 8 MG tablet Take 8 mg by mouth every 8 (eight) hours as needed for nausea or vomiting.    . OXYGEN Inhale 2 L/min into the lungs continuous.     . SYMBICORT 160-4.5 MCG/ACT inhaler Inhale 2 puffs into the lungs 2 (two) times  daily. Rinse mouth well after use    . traZODone (DESYREL) 50 MG tablet Take 25 mg by mouth at bedtime.     . vitamin B-12 (CYANOCOBALAMIN) 1000 MCG tablet Take 1,000 mcg by mouth daily.     No current facility-administered medications for this visit.     PHYSICAL EXAMINATION: ECOG PERFORMANCE STATUS: 3 - Symptomatic, >50% confined to bed  Vitals:   05/19/17 0949  BP: 102/64  Pulse: (!) 56  Resp: 17  Temp: 98.2 F (36.8 C)   Filed Weights   05/19/17 0949  Weight: 195 lb (88.5 kg)    GENERAL:alert, no distress and comfortable SKIN: skin color, texture, turgor are normal, no rashes or significant lesions EYES: normal, Conjunctiva are pink and non-injected, sclera clear OROPHARYNX:no exudate, no erythema and lips, buccal mucosa, and tongue normal  NECK: supple, thyroid normal size, non-tender, without nodularity LYMPH:  no palpable lymphadenopathy in the cervical, axillary or inguinal LUNGS: clear to auscultation and percussion with normal breathing effort HEART: regular rate & rhythm and no murmurs and no lower extremity edema ABDOMEN:abdomen soft, non-tender and normal bowel sounds MUSCULOSKELETAL:no cyanosis of digits and no clubbing  NEURO: Advanced Alzheimer's dementia EXTREMITIES: No lower extremity edema BREAST: No palpable masses or nodules in either right or left breasts. No palpable axillary supraclavicular or infraclavicular adenopathy no breast tenderness or nipple discharge. (exam performed in the presence of a chaperone)  LABORATORY DATA:  I have reviewed the data as listed   Chemistry      Component Value Date/Time   NA 143 02/21/2017   NA 142 10/02/2014 1302   K 4.0 02/21/2017   K 4.3 10/02/2014 1302   CL 97 (L) 10/09/2015 0541   CL 98 03/08/2013 0959   CO2 31 10/09/2015 0541   CO2 30 (H) 10/02/2014 1302   BUN 20 02/21/2017   BUN 19.7 10/02/2014 1302   CREATININE 1.0 02/21/2017   CREATININE 0.78 10/09/2015 0541   CREATININE 0.8 10/02/2014 1302   GLU  102 02/21/2017      Component Value Date/Time   CALCIUM 8.7 (L) 10/09/2015 0541   CALCIUM 9.9 10/02/2014 1302   ALKPHOS 51 02/21/2017   ALKPHOS 101 10/02/2014 1302   AST 8 (A) 02/21/2017   AST 18 10/02/2014 1302   ALT 3 (A) 02/21/2017   ALT 21 10/02/2014 1302   BILITOT 0.8 10/06/2015 1025   BILITOT 0.60 10/02/2014 1302       Lab Results  Component Value Date   WBC 7.9 10/27/2016   HGB 12.5 10/27/2016   HCT 39 10/27/2016   MCV 83.4 10/09/2015   PLT 250 10/27/2016   NEUTROABS 4 02/17/2016    ASSESSMENT & PLAN:  Breast cancer of lower-inner quadrant of right female breast (HCC) 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 And Aromasin 25 mg daily .   Surveillance: 1. Mammograms 04/20/2017 at [facility]: Benign, breast density category B 2. breast exam 05/19/2017: No palpable lumps or nodules  Aromasin toxicities: Tolerated it extremely well. Since she had completed 5 years of therapy, we discussed the pros and cons of extended adjuvant therapy and determined that she does not need to continue any further on Aromasin.  Patient will be seen on an as-needed basis.   I spent 15 minutes talking to the patient of which more than half was spent in counseling and coordination of care.  No orders of the defined types were placed in this encounter.  The patient has a good understanding of the overall plan. she agrees with it. she will call with any problems that may develop before the next visit here.   Rulon Eisenmenger, MD 05/19/17

## 2018-03-18 ENCOUNTER — Other Ambulatory Visit: Payer: Self-pay

## 2018-03-18 ENCOUNTER — Encounter (HOSPITAL_COMMUNITY): Payer: Self-pay

## 2018-03-18 ENCOUNTER — Inpatient Hospital Stay (HOSPITAL_COMMUNITY)
Admission: EM | Admit: 2018-03-18 | Discharge: 2018-03-23 | DRG: 813 | Disposition: A | Discharge status: Skilled Nursing Facility | Payer: Medicare Other | Attending: Internal Medicine | Admitting: Internal Medicine

## 2018-03-18 ENCOUNTER — Emergency Department (HOSPITAL_COMMUNITY): Payer: Medicare Other

## 2018-03-18 DIAGNOSIS — D693 Immune thrombocytopenic purpura: Secondary | ICD-10-CM | POA: Diagnosis present

## 2018-03-18 DIAGNOSIS — N189 Chronic kidney disease, unspecified: Secondary | ICD-10-CM | POA: Diagnosis present

## 2018-03-18 DIAGNOSIS — Z7401 Bed confinement status: Secondary | ICD-10-CM

## 2018-03-18 DIAGNOSIS — E1169 Type 2 diabetes mellitus with other specified complication: Secondary | ICD-10-CM | POA: Diagnosis present

## 2018-03-18 DIAGNOSIS — M810 Age-related osteoporosis without current pathological fracture: Secondary | ICD-10-CM | POA: Diagnosis present

## 2018-03-18 DIAGNOSIS — Z79899 Other long term (current) drug therapy: Secondary | ICD-10-CM

## 2018-03-18 DIAGNOSIS — Z853 Personal history of malignant neoplasm of breast: Secondary | ICD-10-CM

## 2018-03-18 DIAGNOSIS — E860 Dehydration: Secondary | ICD-10-CM | POA: Diagnosis not present

## 2018-03-18 DIAGNOSIS — I482 Chronic atrial fibrillation, unspecified: Secondary | ICD-10-CM | POA: Diagnosis present

## 2018-03-18 DIAGNOSIS — G4733 Obstructive sleep apnea (adult) (pediatric): Secondary | ICD-10-CM | POA: Diagnosis not present

## 2018-03-18 DIAGNOSIS — I481 Persistent atrial fibrillation: Secondary | ICD-10-CM | POA: Diagnosis present

## 2018-03-18 DIAGNOSIS — E1122 Type 2 diabetes mellitus with diabetic chronic kidney disease: Secondary | ICD-10-CM | POA: Diagnosis present

## 2018-03-18 DIAGNOSIS — E669 Obesity, unspecified: Secondary | ICD-10-CM | POA: Diagnosis not present

## 2018-03-18 DIAGNOSIS — R0989 Other specified symptoms and signs involving the circulatory and respiratory systems: Secondary | ICD-10-CM

## 2018-03-18 DIAGNOSIS — M109 Gout, unspecified: Secondary | ICD-10-CM | POA: Diagnosis present

## 2018-03-18 DIAGNOSIS — J9611 Chronic respiratory failure with hypoxia: Secondary | ICD-10-CM | POA: Diagnosis not present

## 2018-03-18 DIAGNOSIS — D860 Sarcoidosis of lung: Secondary | ICD-10-CM | POA: Diagnosis present

## 2018-03-18 DIAGNOSIS — N179 Acute kidney failure, unspecified: Secondary | ICD-10-CM | POA: Diagnosis not present

## 2018-03-18 DIAGNOSIS — Z7901 Long term (current) use of anticoagulants: Secondary | ICD-10-CM

## 2018-03-18 DIAGNOSIS — E785 Hyperlipidemia, unspecified: Secondary | ICD-10-CM | POA: Diagnosis present

## 2018-03-18 DIAGNOSIS — I130 Hypertensive heart and chronic kidney disease with heart failure and stage 1 through stage 4 chronic kidney disease, or unspecified chronic kidney disease: Secondary | ICD-10-CM | POA: Diagnosis present

## 2018-03-18 DIAGNOSIS — Z9981 Dependence on supplemental oxygen: Secondary | ICD-10-CM

## 2018-03-18 DIAGNOSIS — J40 Bronchitis, not specified as acute or chronic: Secondary | ICD-10-CM | POA: Diagnosis present

## 2018-03-18 DIAGNOSIS — E1165 Type 2 diabetes mellitus with hyperglycemia: Secondary | ICD-10-CM | POA: Diagnosis present

## 2018-03-18 DIAGNOSIS — J441 Chronic obstructive pulmonary disease with (acute) exacerbation: Secondary | ICD-10-CM | POA: Diagnosis present

## 2018-03-18 DIAGNOSIS — Z8049 Family history of malignant neoplasm of other genital organs: Secondary | ICD-10-CM

## 2018-03-18 DIAGNOSIS — Z9989 Dependence on other enabling machines and devices: Secondary | ICD-10-CM

## 2018-03-18 DIAGNOSIS — K625 Hemorrhage of anus and rectum: Secondary | ICD-10-CM | POA: Diagnosis not present

## 2018-03-18 DIAGNOSIS — Z801 Family history of malignant neoplasm of trachea, bronchus and lung: Secondary | ICD-10-CM | POA: Diagnosis not present

## 2018-03-18 DIAGNOSIS — K5731 Diverticulosis of large intestine without perforation or abscess with bleeding: Secondary | ICD-10-CM

## 2018-03-18 DIAGNOSIS — K922 Gastrointestinal hemorrhage, unspecified: Secondary | ICD-10-CM | POA: Diagnosis not present

## 2018-03-18 DIAGNOSIS — D696 Thrombocytopenia, unspecified: Secondary | ICD-10-CM | POA: Diagnosis present

## 2018-03-18 DIAGNOSIS — F0390 Unspecified dementia without behavioral disturbance: Secondary | ICD-10-CM | POA: Diagnosis present

## 2018-03-18 DIAGNOSIS — Z91041 Radiographic dye allergy status: Secondary | ICD-10-CM

## 2018-03-18 DIAGNOSIS — J449 Chronic obstructive pulmonary disease, unspecified: Secondary | ICD-10-CM | POA: Diagnosis present

## 2018-03-18 DIAGNOSIS — F0280 Dementia in other diseases classified elsewhere without behavioral disturbance: Secondary | ICD-10-CM | POA: Diagnosis not present

## 2018-03-18 DIAGNOSIS — G309 Alzheimer's disease, unspecified: Secondary | ICD-10-CM | POA: Diagnosis not present

## 2018-03-18 DIAGNOSIS — Z7189 Other specified counseling: Secondary | ICD-10-CM | POA: Diagnosis not present

## 2018-03-18 DIAGNOSIS — Z794 Long term (current) use of insulin: Secondary | ICD-10-CM | POA: Diagnosis present

## 2018-03-18 DIAGNOSIS — Z515 Encounter for palliative care: Secondary | ICD-10-CM | POA: Diagnosis not present

## 2018-03-18 DIAGNOSIS — Z6837 Body mass index (BMI) 37.0-37.9, adult: Secondary | ICD-10-CM

## 2018-03-18 DIAGNOSIS — Z886 Allergy status to analgesic agent status: Secondary | ICD-10-CM

## 2018-03-18 DIAGNOSIS — Z888 Allergy status to other drugs, medicaments and biological substances status: Secondary | ICD-10-CM

## 2018-03-18 DIAGNOSIS — Z8542 Personal history of malignant neoplasm of other parts of uterus: Secondary | ICD-10-CM

## 2018-03-18 DIAGNOSIS — D62 Acute posthemorrhagic anemia: Secondary | ICD-10-CM | POA: Diagnosis present

## 2018-03-18 DIAGNOSIS — Z96653 Presence of artificial knee joint, bilateral: Secondary | ICD-10-CM | POA: Diagnosis present

## 2018-03-18 DIAGNOSIS — Z800 Family history of malignant neoplasm of digestive organs: Secondary | ICD-10-CM | POA: Diagnosis not present

## 2018-03-18 DIAGNOSIS — I5032 Chronic diastolic (congestive) heart failure: Secondary | ICD-10-CM | POA: Diagnosis present

## 2018-03-18 LAB — COMPREHENSIVE METABOLIC PANEL
ALBUMIN: 3.2 g/dL — AB (ref 3.5–5.0)
ALK PHOS: 63 U/L (ref 38–126)
ALT: 10 U/L — ABNORMAL LOW (ref 14–54)
ANION GAP: 10 (ref 5–15)
AST: 14 U/L — ABNORMAL LOW (ref 15–41)
BILIRUBIN TOTAL: 0.7 mg/dL (ref 0.3–1.2)
BUN: 22 mg/dL — ABNORMAL HIGH (ref 6–20)
CALCIUM: 9.6 mg/dL (ref 8.9–10.3)
CO2: 32 mmol/L (ref 22–32)
CREATININE: 0.83 mg/dL (ref 0.44–1.00)
Chloride: 98 mmol/L — ABNORMAL LOW (ref 101–111)
GFR calc Af Amer: >60 mL/min (ref >60)
GFR calc non Af Amer: >60 mL/min (ref >60)
GLUCOSE: 148 mg/dL — AB (ref 65–99)
Potassium: 4.3 mmol/L (ref 3.5–5.1)
Sodium: 140 mmol/L (ref 135–145)
Total Protein: 6.7 g/dL (ref 6.5–8.1)

## 2018-03-18 LAB — CBC WITH DIFFERENTIAL/PLATELET
Basophils Absolute: 0 10*3/uL (ref 0.0–0.1)
Basophils Relative: 0 %
Eosinophils Absolute: 0.4 10*3/uL (ref 0.0–0.7)
Eosinophils Relative: 3 %
HCT: 40.5 % (ref 36.0–46.0)
HEMOGLOBIN: 12.8 g/dL (ref 12.0–15.0)
LYMPHS ABS: 1.4 10*3/uL (ref 0.7–4.0)
LYMPHS PCT: 12 %
MCH: 25.7 pg — ABNORMAL LOW (ref 26.0–34.0)
MCHC: 31.6 g/dL (ref 30.0–36.0)
MCV: 81.2 fL (ref 78.0–100.0)
MONOS PCT: 8 %
Monocytes Absolute: 0.9 10*3/uL (ref 0.1–1.0)
NEUTROS PCT: 77 %
Neutro Abs: 9.2 10*3/uL — ABNORMAL HIGH (ref 1.7–7.7)
Platelets: <5 10*3/uL — CL (ref 150–400)
RBC: 4.99 MIL/uL (ref 3.87–5.11)
RDW: 15.2 % (ref 11.5–15.5)
WBC: 11.8 10*3/uL — ABNORMAL HIGH (ref 4.0–10.5)

## 2018-03-18 LAB — PROTIME-INR
INR: 1.67
Prothrombin Time: 19.6 seconds — ABNORMAL HIGH (ref 11.4–15.2)

## 2018-03-18 LAB — TYPE AND SCREEN
ABO/RH(D): O NEG
Antibody Screen: NEGATIVE

## 2018-03-18 LAB — APTT: APTT: 37 s — AB (ref 24–36)

## 2018-03-18 LAB — FIBRINOGEN: Fibrinogen: 553 mg/dL — ABNORMAL HIGH (ref 210–475)

## 2018-03-18 LAB — TECHNOLOGIST SMEAR REVIEW

## 2018-03-18 MED ORDER — BACITRACIN ZINC 500 UNIT/GM EX OINT
TOPICAL_OINTMENT | Freq: Two times a day (BID) | CUTANEOUS | Status: DC
Start: 2018-03-18 — End: 2018-03-23
  Administered 2018-03-19 – 2018-03-23 (×9): via TOPICAL
  Filled 2018-03-18 (×13): qty 28.35

## 2018-03-18 MED ORDER — SODIUM CHLORIDE 0.9 % IV SOLN
Freq: Once | INTRAVENOUS | Status: AC
  Administered 2018-03-18: 23:00:00 via INTRAVENOUS

## 2018-03-18 MED ORDER — ACETAMINOPHEN 325 MG PO TABS: 650.0000 mg | ORAL_TABLET | Freq: Four times a day (QID) | ORAL | Status: DC | PRN

## 2018-03-18 MED ORDER — DEXAMETHASONE SODIUM PHOSPHATE 10 MG/ML IJ SOLN
20.0000 mg | INTRAMUSCULAR | Status: DC
  Administered 2018-03-19 (×2): 20 mg via INTRAVENOUS
  Filled 2018-03-18 (×2): qty 2

## 2018-03-18 MED ORDER — ONDANSETRON HCL 4 MG/2ML IJ SOLN: 4.0000 mg | Freq: Four times a day (QID) | INTRAMUSCULAR | Status: DC | PRN

## 2018-03-18 MED ORDER — DONEPEZIL HCL 10 MG PO TABS
10.0000 mg | ORAL_TABLET | Freq: Every day | ORAL | Status: DC
  Administered 2018-03-19 – 2018-03-22 (×4): 10 mg via ORAL
  Filled 2018-03-18 (×5): qty 1

## 2018-03-18 MED ORDER — ZINC OXIDE 40 % EX OINT
TOPICAL_OINTMENT | Freq: Three times a day (TID) | CUTANEOUS | Status: DC
  Administered 2018-03-19 – 2018-03-23 (×11): via TOPICAL
  Filled 2018-03-18: qty 57

## 2018-03-18 MED ORDER — BISACODYL 5 MG PO TBEC: 5.0000 mg | DELAYED_RELEASE_TABLET | Freq: Every day | ORAL | Status: DC | PRN

## 2018-03-18 MED ORDER — POLYVINYL ALCOHOL 1.4 % OP SOLN
1.0000 [drp] | Freq: Three times a day (TID) | OPHTHALMIC | Status: DC
  Administered 2018-03-19 – 2018-03-23 (×13): 1 [drp] via OPHTHALMIC
  Filled 2018-03-18: qty 15

## 2018-03-18 MED ORDER — VENLAFAXINE HCL ER 37.5 MG PO CP24
37.5000 mg | ORAL_CAPSULE | Freq: Every day | ORAL | Status: DC
  Administered 2018-03-19 – 2018-03-23 (×5): 37.5 mg via ORAL
  Filled 2018-03-18 (×6): qty 1

## 2018-03-18 MED ORDER — ACETAMINOPHEN 650 MG RE SUPP: 650.0000 mg | Freq: Four times a day (QID) | RECTAL | Status: DC | PRN

## 2018-03-18 MED ORDER — MOMETASONE FURO-FORMOTEROL FUM 200-5 MCG/ACT IN AERO
2.0000 | INHALATION_SPRAY | Freq: Two times a day (BID) | RESPIRATORY_TRACT | Status: DC
  Administered 2018-03-20 – 2018-03-23 (×7): 2 via RESPIRATORY_TRACT
  Filled 2018-03-18: qty 8.8

## 2018-03-18 MED ORDER — MAGNESIUM CITRATE PO SOLN: 1.0000 | Freq: Once | ORAL | Status: DC | PRN

## 2018-03-18 MED ORDER — OXYCODONE HCL 5 MG PO TABS: 5.0000 mg | ORAL_TABLET | Freq: Four times a day (QID) | ORAL | Status: DC | PRN

## 2018-03-18 MED ORDER — PROPYLENE GLYCOL 0.6 % OP SOLN: Freq: Three times a day (TID) | OPHTHALMIC | Status: DC

## 2018-03-18 MED ORDER — POLYETHYLENE GLYCOL 3350 17 G PO PACK: 17.0000 g | PACK | Freq: Every day | ORAL | Status: DC | PRN

## 2018-03-18 MED ORDER — ALBUTEROL SULFATE (2.5 MG/3ML) 0.083% IN NEBU: 2.5000 mg | INHALATION_SOLUTION | RESPIRATORY_TRACT | Status: DC | PRN

## 2018-03-18 MED ORDER — ONDANSETRON HCL 4 MG PO TABS: 4.0000 mg | ORAL_TABLET | Freq: Four times a day (QID) | ORAL | Status: DC | PRN

## 2018-03-18 MED ORDER — FUROSEMIDE 20 MG PO TABS
20.0000 mg | ORAL_TABLET | Freq: Every day | ORAL | Status: DC
  Administered 2018-03-19 – 2018-03-21 (×3): 20 mg via ORAL
  Filled 2018-03-18 (×3): qty 1

## 2018-03-18 MED ORDER — METOCLOPRAMIDE HCL 5 MG PO TABS
5.0000 mg | ORAL_TABLET | Freq: Two times a day (BID) | ORAL | Status: DC
  Administered 2018-03-19 – 2018-03-23 (×9): 5 mg via ORAL
  Filled 2018-03-18 (×11): qty 1

## 2018-03-18 MED ORDER — IMMUNE GLOBULIN (HUMAN) 10 GM/100ML IV SOLN
1.0000 g/kg | INTRAVENOUS | Status: AC
  Administered 2018-03-19 (×2): 90 g via INTRAVENOUS
  Filled 2018-03-18: qty 800
  Filled 2018-03-18: qty 100

## 2018-03-18 NOTE — Consult Note (Signed)
[place] CONSULT NOTE  Patient Care Team: Patient, No Pcp Per as PCP - General (General Practice)  CHIEF COMPLAINTS/PURPOSE OF CONSULTATION: A Acute thrombocytopenia  HISTORY OF PRESENTING ILLNESS:  Sharon TREMBLAY 77 y.o. female is here because of thrombocytopenia. HER-2 sons, Elberta Fortis and [name] were present.  Both of them were her healthcare medical power of attorney She was found to have abnormal CBC from blood count from the ER visit. The patient have advanced dementia.  History is not possible. The patient was seen by Dr. Lindi Adie a year ago for breast cancer status post right lumpectomy and radiation treatment. Over the last few days, according to HER-2 sons, she was noted to have diffuse rash. The patient has incontinence and wears an adult diaper.  She was noted to have rectal bleeding and hence was brought in. The patient have chronic atrial fibrillation requiring chronic anticoagulation therapy for stroke prevention. There were no spontaneous epistaxis or hematuria Family members did not report any recent infection requiring antibiotics therapy.  She has chronic cough from bronchitis.  There were no reported recent diarrhea. Her past medical past surgical history, social history, family history and medications were reviewed from electronic records. MEDICAL HISTORY:  Past Medical History:  Diagnosis Date  . Anemia   . Asthma   . Atrial fibrillation, persistent ([place]) 11/01/2013  . Breast cancer, stage 1 ([place]) 10/17/2011   s/p right lumpectomy and XRT  . Bronchitis   . Bruises easily   . Chills   . Chronic anticoagulation, on Eliquis 11/01/2013  . Chronic bronchitis ([place])   . Chronic diastolic CHF (congestive heart failure) ([place])   . Constipation   . COPD (chronic obstructive pulmonary disease) ([place])   . Cough   . Dementia without behavioral disturbance   . Dyslipidemia   . Fracture of left lower leg 06/1972   "no surgery; just casted it"  . Generalized  weakness   . Gout attack    ankle, then wrist and hands  . Gout without tophus   . Hernia   . History of nuclear stress test 02/09/2010   dipyridamole; normal pattern of perfusion in all regions with attenuation artifact in inferior region, low risk   . Hypertension   . Insomnia   . Morbid obesity ([place])   . On home oxygen therapy    "2L prn" (01/24/2014)  . OSA on CPAP   . Pneumonia 2012; 2014  . Protein calorie malnutrition ([place])   . Sarcoid   . Sarcoidosis   . Type II diabetes mellitus ([place])   . Uterine cancer [facility])    s/p hysterectomy  . Wheezing     SURGICAL HISTORY: Past Surgical History:  Procedure Laterality Date  . ABDOMINAL HYSTERECTOMY  02/04/2005  . BREAST BIOPSY Right 01/2011  . BREAST LUMPECTOMY Right 01/2011  . HERNIA REPAIR    . TOTAL KNEE ARTHROPLASTY Bilateral 03/2010; 06/2011   left; right  . TRANSTHORACIC ECHOCARDIOGRAM  07/2008   EF, LV size is normal; RVSP normal; mod calcif of MV apparatus  . TUBAL LIGATION  1970's  . UMBILICAL HERNIA REPAIR  02/04/2005    SOCIAL HISTORY: Social History   Socioeconomic History  . Marital status: Divorced    Spouse name: Not on file  . Number of children: 2  . Years of education: Not on file  . Highest education level: Not on file  Occupational History  . Occupation: RETIRED    Employer: RETIRED    CommentBuilding services engineer  Social  Needs  . Financial resource strain: Not on file  . Food insecurity:    Worry: Not on file    Inability: Not on file  . Transportation needs:    Medical: Not on file    Non-medical: Not on file  Tobacco Use  . Smoking status: Never Smoker  . Smokeless tobacco: Never Used  Substance and Sexual Activity  . Alcohol use: No  . Drug use: No  . Sexual activity: Never    Birth control/protection: Post-menopausal, Surgical  Lifestyle  . Physical activity:    Days per week: Not on file    Minutes per session: Not on file  . Stress: Not on file  Relationships  . Social connections:    Talks  on phone: Not on file    Gets together: Not on file    Attends religious service: Not on file    Active member of club or organization: Not on file    Attends meetings of clubs or organizations: Not on file    Relationship status: Not on file  . Intimate partner violence:    Fear of current or ex partner: Not on file    Emotionally abused: Not on file    Physically abused: Not on file    Forced sexual activity: Not on file  Other Topics Concern  . Not on file  Social History Narrative   Widowed.  Lives alone.  Ambulates with a walker.    FAMILY HISTORY: Family History  Problem Relation Age of Onset  . Diabetes Mother   . Colon cancer Father   . Diabetes Brother   . Diabetes Sister   . Lung cancer Brother   . Cervical cancer Sister   . Thrombocytopenia Neg Hx   . Bleeding Disorder Neg Hx     ALLERGIES:  is allergic to aspirin; ibuprofen; contrast media [iodinated diagnostic agents]; and pravachol.  MEDICATIONS:  Current Facility-Administered Medications  Medication Dose Route Frequency Provider Last Rate Last Dose  . 0.9 %  sodium chloride infusion   Intravenous Once Short, Noah Delaine, MD      . acetaminophen (TYLENOL) tablet 650 mg  650 mg Oral Q6H PRN Short, Noah Delaine, MD       Or  . acetaminophen (TYLENOL) suppository 650 mg  650 mg Rectal Q6H PRN Short, Mackenzie, MD      . albuterol (PROVENTIL) (2.5 MG/3ML) 0.083% nebulizer solution 2.5 mg  2.5 mg Nebulization Q4H PRN Short, Mackenzie, MD      . bacitracin ointment   Topical BID Short, Mackenzie, MD      . bisacodyl (DULCOLAX) EC tablet 5 mg  5 mg Oral Daily PRN Short, Mackenzie, MD      . donepezil (ARICEPT) tablet 10 mg  10 mg Oral Hettie Holstein, MD      . Derrill Memo ON 03/19/2018] furosemide (LASIX) tablet 20 mg  20 mg Oral Daily Short, Mackenzie, MD      . liver oil-zinc oxide (DESITIN) 40 % ointment   Topical TID Short, Noah Delaine, MD      . magnesium citrate solution 1 Bottle  1 Bottle Oral Once PRN Short,  Mackenzie, MD      . metoCLOPramide (REGLAN) tablet 5 mg  5 mg Oral BID Short, Mackenzie, MD      . mometasone-formoterol (DULERA) 200-5 MCG/ACT inhaler 2 puff  2 puff Inhalation BID Short, Mackenzie, MD      . ondansetron (ZOFRAN) tablet 4 mg  4 mg Oral Q6H PRN Janece Canterbury, MD  Or  . ondansetron (ZOFRAN) injection 4 mg  4 mg Intravenous Q6H PRN Short, Mackenzie, MD      . oxyCODONE (Oxy IR/ROXICODONE) immediate release tablet 5 mg  5 mg Oral Q6H PRN Short, Mackenzie, MD      . polyethylene glycol (MIRALAX / GLYCOLAX) packet 17 g  17 g Oral Daily PRN Short, Mackenzie, MD      . Propylene Glycol 0.6 % SOLN   Both Eyes TID Janece Canterbury, MD      . Derrill Memo ON 03/19/2018] venlafaxine XR (EFFEXOR-XR) 24 hr capsule 37.5 mg  37.5 mg Oral Q breakfast Short, Noah Delaine, MD       Current Outpatient Medications  Medication Sig Dispense Refill  . albuterol (PROVENTIL) (2.5 MG/3ML) 0.083% nebulizer solution Take by nebulization every 6 (six) hours as needed for wheezing or shortness of breath.    Marland Kitchen apixaban (ELIQUIS) 5 MG TABS tablet TAKE 1 TABLET (5 MG TOTAL) BY MOUTH 2 (TWO) TIMES DAILY. 60 tablet 5  . Calcium Carb-Cholecalciferol 500-200 MG-UNIT TABS Take 1 tablet by mouth 2 (two) times daily. Reported on 01/26/2016    . diltiazem (CARDIZEM CD) 240 MG 24 hr capsule Take 240 mg by mouth every morning.     . docusate sodium (COLACE) 100 MG capsule Take 100 mg by mouth daily as needed for mild constipation.    Marland Kitchen donepezil (ARICEPT) 10 MG tablet Take 10 mg by mouth at bedtime.    . furosemide (LASIX) 20 MG tablet Take 20 mg by mouth daily.    . hydrocerin (EUCERIN) CREA Apply 1 application topically daily as needed (dry feet).     Marland Kitchen lactulose (CHRONULAC) 10 GM/15ML solution Take by mouth daily. Give 15 mL by mouth    . metoCLOPramide (REGLAN) 5 MG tablet Take 5 mg by mouth 2 (two) times daily.    . ondansetron (ZOFRAN) 8 MG tablet Take 8 mg by mouth every 8 (eight) hours as needed for nausea or  vomiting.    . OXYGEN Inhale 2 L/min into the lungs continuous.     Marland Kitchen Propylene Glycol (SYSTANE COMPLETE OP) Place 1 drop into both eyes 3 (three) times daily.    . SYMBICORT 160-4.5 MCG/ACT inhaler Inhale 2 puffs into the lungs 2 (two) times daily. Rinse mouth well after use    . venlafaxine XR (EFFEXOR-XR) 37.5 MG 24 hr capsule Take 37.5 mg by mouth daily with breakfast.    . Vitamin D, Ergocalciferol, (DRISDOL) 50000 units CAPS capsule Take 50,000 Units by mouth every 28 (twenty-eight) days.      REVIEW OF SYSTEMS: Unable to obtain.  She appears comfortable.  The patient denies pain All other systems were reviewed with the patient and are negative.  PHYSICAL EXAMINATION: ECOG PERFORMANCE STATUS: 3 - Symptomatic, >50% confined to bed  Vitals:   03/18/18 1630 03/18/18 1700  BP: 104/79 115/67  Pulse: (!) 58 66  Resp: (!) 27 (!) 24  Temp:    SpO2: (!) 87% 96%   There were no vitals filed for this visit.  GENERAL:alert, no distress and comfortable.  She looks somewhat pale SKIN: Extensive petechiae rashes and bruises are noted EYES: normal, conjunctiva are pink and non-injected, sclera clear OROPHARYNX: Noted oral bleeding NECK: Well-healed thyroidectomy scar LYMPH:  no palpable lymphadenopathy in the cervical, axillary or inguinal LUNGS: clear to auscultation and percussion with normal breathing effort HEART: regular rate & rhythm and no murmurs with mild bilateral lower extremity edema ABDOMEN:abdomen soft, non-tender and normal bowel sounds.  Abdominal wall hernia is noted.  Multiple surgical scars Musculoskeletal:no cyanosis of digits and no clubbing  PSYCH: Intermittently alert NEURO: Unable to examine Bilateral breasts examination is performed.  Well-healed lumpectomy scar on the right breast.  Noted inverted nipple on the right  LABORATORY DATA:  I have reviewed the data as listed CBC Latest Ref Rng & Units 03/18/2018 03/18/2018 10/27/2016  WBC 4.0 - 10.5 K/uL 13.7(H)  11.8(H) 7.9  Hemoglobin 12.0 - 15.0 g/dL 12.6 12.8 12.5  Hematocrit 36.0 - 46.0 % 39.9 40.5 39  Platelets 150 - 400 K/uL <5(LL) <5(LL) 250   I have reviewed her peripheral blood smear in the lab.  Noted some mild leukocytosis with reactive looking lymphocyte.  Occasional hyperlobated white blood cells are noted.  Absolute thrombocytopenia with no platelets detected.  No evidence of platelet clumping.  Normal morphology of red blood cell  RADIOGRAPHIC STUDIES: I have personally reviewed the radiological images as listed and agreed with the findings in the report. Dg Chest Port 1 View  Result Date: 03/18/2018 CLINICAL DATA:  Shortness of breath today EXAM: PORTABLE CHEST 1 VIEW COMPARISON:  October 05, 2015 FINDINGS: The heart size and mediastinal contours are within normal limits. Mild chronic increased pulmonary interstitium is identified bilaterally unchanged. There is no focal pneumonia or pleural effusion. The visualized skeletal structures are stable. IMPRESSION: No active cardiopulmonary disease. Electronically Signed   By: Abelardo Diesel M.D.   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   She is noted to have mild leukocytosis although there were no reported recent infection According to her chart, the patient had history of B12 deficiency.  I will recheck serum vitamin B12 and folate acid level tomorrow We discussed the risks, benefits, side effects of treatment with platelet transfusion to stop the bleeding and his sons agreed to proceed We also discussed about the risk, benefit, side effects of corticosteroid treatment and IVIG, including risk of infusion reaction and numerous side effects that could be associated with steroid treatment. Due to her age, I will put her on IV dexamethasone,  20 mg daily for 4 days and IVIG at the rate of 1 g/kg daily IV for x2 days She will get platelet count checked daily If she did not respond to the treatment, further investigation would need to be performed such as CT imaging to rule out malignancy and potentially bone marrow aspirate and biopsy  Advanced dementia It is possible that the patient may have exacerbation of dementia/delirium with steroid therapy. Her sons want her to get all the treatment possible to treat her acute ITP  Chronic atrial fibrillation on anticoagulation therapy We will hold anticoagulation treatment Her sons are warned about potential risk of bleeding now due to low platelet count and potential thrombosis with discontinuation of anticoagulation therapy  Goals of care discussion The patient had a DNR order in NH However, both her sons rescinded the DNR order while she is hospitalized and wants everything possible done  Discharge planning She will likely be here for the next 2 to 3 days. Further discussion needs to happen prior to discharge about long-term follow-up and balancing between the risk of bleeding with possibility of recurrence of ITP and the risk of clotting if she goes on too long without anticoagulation therapy I think  palliative care consult is highly appropriate in this situation Please call if questions arise Dr. Lindi Adie will see the patient tomorrow for further follow-up  All questions were answered.     Heath Lark, MD 03/18/2018 6:43 PM

## 2018-03-18 NOTE — H&P (Addendum)
History and Physical    [name] FGH:[id number] DOB: [birth year] DOA: 03/18/2018  Referring MD/NP/PA: Blanchie Dessert PCP: Patient, No Pcp Per   Patient coming from: SNF  Chief Complaint: rectal bleeding  HPI: [name] is a 77 y.o. female with history of diabetes mellitus type 2, COPD, OSA, chronic diastolic heart failure, persistent atrial fibrillation on Eliquis, dementia, and diverticulosis  who presented from skilled nursing with rectal bleeding.  At baseline she recognizes her sons, knows where she is most of the time, but has problems with [name]-term memory and time.  This past week, 1 of the caretakers noticed that she was not acting correctly and had wondered if any medication changes have been made.  They also noticed this week that she had developed an unusual rash that look like bruising on her arms and legs.  This morning, she had obvious rectal bleeding and was transported via EMS to the hospital.  She has not had any fevers, chills, difficulty breathing, vomiting or diarrhea in the last few weeks.  She has not been on any antibiotics recently.  There is no family history of any platelet disorders or bleeding disorders and the patient has personally never had any problems with these issues.  ED Course: Vital signs afebrile, pulse in the 50s-70s, blood pressure in the low 100s-110s over 60s-70s, oxygen saturations in the mid 90s on 2 L nasal cannula.  Labs: White blood cell count 11.8, hemoglobin 12.8, platelets less than 5.  INR 1.67 (on Eliquis).  Rectal exam demonstrated obvious stool mixed with red and dark red blood.   Review of Systems: Limited by patient's dementia.  She denies any pains, congestion, difficulty breathing, nausea, abdominal discomfort.  She has had some vulvar itching.  Complete 12 point review of systems reviewed with patient and negative except as mentioned above.    Past Medical History:  Diagnosis Date  . Anemia   . Asthma   . Atrial  fibrillation, persistent ([place]) 11/01/2013  . Breast cancer, stage 1 ([place]) 10/17/2011   s/p right lumpectomy and XRT  . Bronchitis   . Bruises easily   . Chills   . Chronic anticoagulation, on Eliquis 11/01/2013  . Chronic bronchitis ([place])   . Chronic diastolic CHF (congestive heart failure) ([place])   . Constipation   . COPD (chronic obstructive pulmonary disease) ([place])   . Cough   . Dementia without behavioral disturbance   . Dyslipidemia   . Fracture of left lower leg 06/1972   "no surgery; just casted it"  . Generalized weakness   . Gout attack    ankle, then wrist and hands  . Gout without tophus   . Hernia   . History of nuclear stress test 02/09/2010   dipyridamole; normal pattern of perfusion in all regions with attenuation artifact in inferior region, low risk   . Hypertension   . Insomnia   . Morbid obesity ([place])   . On home oxygen therapy    "2L prn" (01/24/2014)  . OSA on CPAP   . Pneumonia 2012; 2014  . Protein calorie malnutrition ([place])   . Sarcoid   . Sarcoidosis   . Type II diabetes mellitus ([place])   . Uterine cancer [facility])    s/p hysterectomy  . Wheezing     Past Surgical History:  Procedure Laterality Date  . ABDOMINAL HYSTERECTOMY  02/04/2005  . BREAST BIOPSY Right 01/2011  . BREAST LUMPECTOMY Right 01/2011  . HERNIA REPAIR    . TOTAL  KNEE ARTHROPLASTY Bilateral 03/2010; 06/2011   left; right  . TRANSTHORACIC ECHOCARDIOGRAM  07/2008   EF, LV size is normal; RVSP normal; mod calcif of MV apparatus  . TUBAL LIGATION  1970's  . UMBILICAL HERNIA REPAIR  02/04/2005     reports that she has never smoked. She has never used smokeless tobacco. She reports that she does not drink alcohol or use drugs.  Allergies  Allergen Reactions  . Aspirin Other (See Comments)    Avoids due to being on blood thinners  . Ibuprofen Other (See Comments)    Avoids due to being on blood thinners  . Contrast Media [Iodinated Diagnostic Agents] Nausea And Vomiting  . Pravachol Other  (See Comments)    Muscle pain    Family History  Problem Relation Age of Onset  . Diabetes Mother   . Colon cancer Father   . Diabetes Brother   . Diabetes Sister   . Lung cancer Brother   . Cervical cancer Sister   . Thrombocytopenia Neg Hx   . Bleeding Disorder Neg Hx     Prior to Admission medications   Medication Sig Start Date End Date Taking? Authorizing Provider  albuterol (PROVENTIL) (2.5 MG/3ML) 0.083% nebulizer solution Take by nebulization every 6 (six) hours as needed for wheezing or shortness of breath.   Yes [provider]  apixaban (ELIQUIS) 5 MG TABS tablet TAKE 1 TABLET (5 MG TOTAL) BY MOUTH 2 (TWO) TIMES DAILY. 10/22/14  Yes Hilty, Nadean Corwin, MD  Calcium Carb-Cholecalciferol 500-200 MG-UNIT TABS Take 1 tablet by mouth 2 (two) times daily. Reported on 01/26/2016   Yes [provider]  diltiazem (CARDIZEM CD) 240 MG 24 hr capsule Take 240 mg by mouth every morning.  01/20/14  Yes [provider]  docusate sodium (COLACE) 100 MG capsule Take 100 mg by mouth daily as needed for mild constipation.   Yes [provider]  donepezil (ARICEPT) 10 MG tablet Take 10 mg by mouth at bedtime.   Yes [provider]  furosemide (LASIX) 20 MG tablet Take 20 mg by mouth daily.   Yes [provider]  hydrocerin (EUCERIN) CREA Apply 1 application topically daily as needed (dry feet).    Yes [provider]  lactulose (CHRONULAC) 10 GM/15ML solution Take by mouth daily. Give 15 mL by mouth   Yes [provider]  metoCLOPramide (REGLAN) 5 MG tablet Take 5 mg by mouth 2 (two) times daily.   Yes [provider]  ondansetron (ZOFRAN) 8 MG tablet Take 8 mg by mouth every 8 (eight) hours as needed for nausea or vomiting.   Yes [provider]  OXYGEN Inhale 2 L/min into the lungs continuous.    Yes [provider]  Propylene Glycol (SYSTANE COMPLETE OP) Place 1 drop into both eyes 3 (three) times  daily.   Yes [provider]  SYMBICORT 160-4.5 MCG/ACT inhaler Inhale 2 puffs into the lungs 2 (two) times daily. Rinse mouth well after use 09/04/15  Yes [provider]  venlafaxine XR (EFFEXOR-XR) 37.5 MG 24 hr capsule Take 37.5 mg by mouth daily with breakfast.   Yes [provider]  Vitamin D, Ergocalciferol, (DRISDOL) 50000 units CAPS capsule Take 50,000 Units by mouth every 28 (twenty-eight) days.   Yes [provider]    Physical Exam: Vitals:   03/18/18 1539 03/18/18 1600 03/18/18 1630 03/18/18 1700  BP: 100/69 116/72 104/79 115/67  Pulse: 79 77 (!) 58 66  Resp: 18 (!)  23 (!) 27 (!) 24  Temp:      TempSrc:      SpO2: 96% 96% (!) 87% 96%    Constitutional:  NAD, calm, comfortable, asleep but arousable.  Appears pale Eyes: PERRL, lids and conjunctivae mildly injected ENMT:  Moist mucous membranes.  Oropharynx nonerythematous, blood blister on her lower lip and on her tongue with petechiae on her soft palate Neck:  No nuchal rigidity, no masses Respiratory:  No wheezes or rhonchi.  Rales heard throughout Cardiovascular: IRRR, no murmurs / rubs / gallops.  2+ radial pulses. Abdomen:  Normal active bowel sounds, soft, nondistended, nontender.  Midline scar Musculoskeletal: Normal muscle tone and bulk.  No contractures.  Skin:  no abrasions or ulcers.  Petechia and purpura on bilateral arms, bilateral legs.  She has a skin tear on her labia anteriorly with some bruising/purpura and petechia. Neurologic:  No facial droop.  Moves both arms and legs spontaneously. Sensation intact to light touch, strength 4/5 throughout Psychiatric:  Alert and oriented to person only. Blunted affect.   Labs on Admission: I have personally reviewed following labs and imaging studies  CBC: Recent Labs  Lab 03/18/18 1410  WBC 11.8*  NEUTROABS 9.2*  HGB 12.8  HCT 40.5  MCV 81.2  PLT <5*   Basic Metabolic Panel: Recent Labs  Lab 03/18/18 1410  NA 140  K  4.3  CL 98*  CO2 32  GLUCOSE 148*  BUN 22*  CREATININE 0.83  CALCIUM 9.6   GFR: CrCl cannot be calculated (Unknown ideal weight.). Liver Function Tests: Recent Labs  Lab 03/18/18 1410  AST 14*  ALT 10*  ALKPHOS 63  BILITOT 0.7  PROT 6.7  ALBUMIN 3.2*   No results for input(s): LIPASE, AMYLASE in the last 168 hours. No results for input(s): AMMONIA in the last 168 hours. Coagulation Profile: Recent Labs  Lab 03/18/18 1410  INR 1.67   Cardiac Enzymes: No results for input(s): CKTOTAL, CKMB, CKMBINDEX, TROPONINI in the last 168 hours. BNP (last 3 results) No results for input(s): PROBNP in the last 8760 hours. HbA1C: No results for input(s): HGBA1C in the last 72 hours. CBG: No results for input(s): GLUCAP in the last 168 hours. Lipid Profile: No results for input(s): CHOL, HDL, LDLCALC, TRIG, CHOLHDL, LDLDIRECT in the last 72 hours. Thyroid Function Tests: No results for input(s): TSH, T4TOTAL, FREET4, T3FREE, THYROIDAB in the last 72 hours. Anemia Panel: No results for input(s): VITAMINB12, FOLATE, FERRITIN, TIBC, IRON, RETICCTPCT in the last 72 hours. Urine analysis:    Component Value Date/Time   COLORURINE YELLOW 10/06/2015 1210   APPEARANCEUR CLEAR 10/06/2015 1210   LABSPEC 1.014 10/06/2015 1210   PHURINE 6.0 10/06/2015 1210   GLUCOSEU NEGATIVE 10/06/2015 1210   HGBUR NEGATIVE 10/06/2015 1210   BILIRUBINUR NEGATIVE 10/06/2015 1210   KETONESUR NEGATIVE 10/06/2015 1210   PROTEINUR NEGATIVE 10/06/2015 1210   UROBILINOGEN 1.0 10/06/2015 1210   NITRITE NEGATIVE 10/06/2015 1210   LEUKOCYTESUR SMALL (A) 10/06/2015 1210   Sepsis Labs: @LABRCNTIP (procalcitonin:4,lacticidven:4) )No results found for this or any previous visit (from the past 240 hour(s)).   Radiological Exams on Admission: Dg Chest Port 1 View  Result Date: 03/18/2018 CLINICAL DATA:  Shortness of breath today EXAM: PORTABLE CHEST 1 VIEW COMPARISON:  October 05, 2015 FINDINGS: The heart size  and mediastinal contours are within normal limits. Mild chronic increased pulmonary interstitium is identified bilaterally unchanged. There is no focal pneumonia or pleural effusion. The visualized skeletal structures are stable. IMPRESSION: No  active cardiopulmonary disease. Electronically Signed   By: Abelardo Diesel M.D.   On: 03/18/2018 17:27    EKG: Independently reviewed. A-fib, low voltage  Assessment/Plan Principal Problem:   Thrombocytopenia ([place]) Active Problems:   Obstructive sleep apnea   COPD (chronic obstructive pulmonary disease) (HCC)   Chronic respiratory failure with hypoxia (HCC)   Chronic atrial fibrillation (HCC)   Chronic anticoagulation, on Eliquis   Chronic diastolic CHF (congestive heart failure) (HCC)   Diabetes mellitus type 2 in obese (HCC)   Dementia without behavioral disturbance   Diverticular hemorrhage   Rectal bleeding secondary most likely to to diverticulosis with thrombocytopenia -Transfuse 2 packets of platelets -Posttransfusion platelet level -Check fibrinogen, Adams TS 13, d-dimer, antiplatelet antibodies -Repeat CBC to confirm low platelets -Hold Eliquis, NSAIDs, any antiplatelet agents  Thrombocytopenia, may be due to severe GI hemorrhage, but degree of thrombocytopenia would be unusual.  Also she has had some mental status changes and evidence of petechia and purpura for the last week.  I expect that a repeat CBC will again demonstrate severe thrombocytopenia.  Differential diagnosis includes ITP with DIC and TTP less likely.   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 on 2L home oxygen -  Continue symbicort and prn albuterol  Ostructive sleep apnea on CPAP  chronic diastolic heart failure with rales on exam suggestive of exacerbation -  CXR:   No acute abnl -  Continue daily lasix -  Daily weights and strict I/O -  May need additional lasix with transfusions  persistent atrial fibrillation -  hold Eliquis 2/2 hemorrhage and thrombocytopenia -  Hold oral diltiazem until bleeding controlled  Distant history of breast and uterine cancers currently in remission  dementia  -  Continue Aricept and venlafaxine   DVT prophylaxis: none for now (already has bruising on legs and platelets undetectable).  Add once plt count starts rising. Code Status: full code (was DNR at facility, but sons rescinded for now)  I placed a palliative care consultation for ongoing discussions and sons were amenable.   Family Communication:  Patient and two sons at bedside Disposition Plan: to SNF in several days Consults called: Hematology/oncology, Dr. Alvy Bimler  Admission status: stepdown, inpatient   Janece Canterbury MD [facility] Pager [phone]  If 7PM-7AM, please contact night-coverage [email or web address] Password [email or web address]  03/18/2018, 5:30 PM

## 2018-03-18 NOTE — Progress Notes (Signed)
CPAP offered in presence of son and patient refuses to wear even at her nursing facility is non-compliant.  Offered if patient changes her mind.

## 2018-03-18 NOTE — ED Triage Notes (Signed)
Staff at [facility] noted pt. To have bloody stool when they cleansed her earlier today. She arrives here in no distress. She is oriented to her situation and tells [place] she is not having any pain nor discomfort. We maintain her usual continuous O2 at 2.5 l.p.m.

## 2018-03-18 NOTE — ED Notes (Signed)
Bed: YW73 Expected date:  Expected time:  Means of arrival:  Comments: 77 yo rectal bleeding, thinners

## 2018-03-18 NOTE — ED Provider Notes (Signed)
[facility] DEPT Provider Note   CSN: [id number] Arrival date & time: 03/18/18  1340     History   Chief Complaint Chief Complaint  Patient presents with  . GI Bleeding    HPI [name] is a 77 y.o. female.  Patient is a 77 year old female with a history of atrial fibrillation on Eliquis, CHF, COPD on 2 L nasal cannula constantly, anemia, sarcoidosis, CKD, diabetes who is in a facility and bedbound presenting today with bleeding from her rectum.  Staff states she seemed Fine yesterday and this morning when they changed her depends there was blood in stool present.  It continued to ooze out and they sent her for further evaluation.  Patient denies any abdominal pain, shortness of breath, chest pain.  She was eating lunch per paramedics when they arrived.  In the past patient has a history of lower GI bleeding.  The history is provided by the patient, the EMS personnel and the nursing home.    Past Medical History:  Diagnosis Date  . Anemia   . Asthma   . Atrial fibrillation, persistent ([place]) 11/01/2013  . Breast cancer, stage 1 ([place]) 10/17/2011   s/p right lumpectomy and XRT  . Bronchitis   . Bruises easily   . Chills   . Chronic anticoagulation, on Eliquis 11/01/2013  . Chronic bronchitis ([place])   . Chronic diastolic CHF (congestive heart failure) ([place])   . Constipation   . COPD (chronic obstructive pulmonary disease) ([place])   . Cough   . Dementia without behavioral disturbance   . Dyslipidemia   . Fracture of left lower leg 06/1972   "no surgery; just casted it"  . Generalized weakness   . Gout attack    ankle, then wrist and hands  . Gout without tophus   . Hernia   . History of nuclear stress test 02/09/2010   dipyridamole; normal pattern of perfusion in all regions with attenuation artifact in inferior region, low risk   . Hypertension   . Insomnia   . Morbid obesity ([place])   . On home oxygen therapy    "2L prn" (01/24/2014)  . OSA  on CPAP   . Pneumonia 2012; 2014  . Protein calorie malnutrition ([place])   . Sarcoid   . Sarcoidosis   . Type II diabetes mellitus ([place])   . Uterine cancer [facility])    s/p hysterectomy  . Wheezing     Patient Active Problem List   Diagnosis Date Noted  . 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 CKD (chronic kidney disease) stage 2, GFR 60-89 ml/min 07/12/2016  . Hypertensive heart disease with heart failure ([place]) 05/17/2016  . Diabetes mellitus type 2 in obese ([place]) 05/17/2016  . Dementia without behavioral disturbance 05/17/2016  . Hypotension 10/05/2015  . Osteoporosis 10/02/2014  . Vertigo 04/19/2014  . Pre-syncope 04/17/2014  . Pulmonary hypertension- PA pressure 46 mmHg 01/29/2014  . Hypokalemia 01/27/2014  . Acute on chronic diastolic heart failure ([place]) 01/24/2014  . Hypertension 11/27/2013  . Constipation 11/27/2013  . Chronic diastolic CHF (congestive heart failure) ([place]) 11/18/2013  . Bradycardia 11/01/2013  . Chronic atrial fibrillation ([place]) 11/01/2013  . Chronic anticoagulation, on Eliquis 11/01/2013  . COPD (chronic obstructive pulmonary disease) ([place]) 08/19/2013  . Chronic respiratory failure with hypoxia ([place]) 08/19/2013  . Weakness generalized 08/19/2013  . Lung nodule seen on imaging study 08/18/2013  . Nausea and vomiting 08/18/2013  . Lower GI bleed 05/18/2013  . Aspiration pneumonia (  [place]) 05/12/2013  . Hyperlipidemia 05/03/2013  . COPD exacerbation ([place]) 11/20/2012  . Gout 11/17/2012  . Hypocalcemia 11/17/2012  . Upper respiratory infection 12/17/2011  . Breast cancer of lower-inner quadrant of right female breast ([place]) 10/17/2011  . Obesity hypoventilation syndrome ([place]) 09/24/2011  . Peripheral edema 09/24/2011  . Anemia 06/01/2011  . 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  . Bronchitis 05/27/2008  . Obstructive sleep apnea 05/27/2008    Past Surgical History:  Procedure Laterality Date  . ABDOMINAL HYSTERECTOMY  02/04/2005  . BREAST BIOPSY  Right 01/2011  . BREAST LUMPECTOMY Right 01/2011  . HERNIA REPAIR    . TOTAL KNEE ARTHROPLASTY Bilateral 03/2010; 06/2011   left; right  . TRANSTHORACIC ECHOCARDIOGRAM  07/2008   EF, LV size is normal; RVSP normal; mod calcif of MV apparatus  . TUBAL LIGATION  1970's  . UMBILICAL HERNIA REPAIR  02/04/2005     OB History   None      Home Medications    Prior to Admission medications   Medication Sig Start Date End Date Taking? Authorizing Provider  albuterol (PROVENTIL) (2.5 MG/3ML) 0.083% nebulizer solution Take by nebulization every 6 (six) hours as needed for wheezing or shortness of breath.    [provider]  apixaban (ELIQUIS) 5 MG TABS tablet TAKE 1 TABLET (5 MG TOTAL) BY MOUTH 2 (TWO) TIMES DAILY. 10/22/14   Hilty, Nadean Corwin, MD  Calcium Carb-Cholecalciferol 500-200 MG-UNIT TABS Take 1 tablet by mouth 2 (two) times daily. Reported on 01/26/2016    [provider]  diltiazem (CARDIZEM CD) 240 MG 24 hr capsule Take 240 mg by mouth every morning.  01/20/14   [provider]  docusate sodium (COLACE) 100 MG capsule Take 100 mg by mouth daily as needed for mild constipation.    [provider]  donepezil (ARICEPT) 10 MG tablet Take 10 mg by mouth at bedtime.    [provider]  exemestane (AROMASIN) 25 MG tablet Take 25 mg by mouth daily after breakfast.    [provider]  furosemide (LASIX) 20 MG tablet Take 20 mg by mouth daily.    [provider]  hydrocerin (EUCERIN) CREA Apply 1 application topically daily as needed.     [provider]  lactulose (CHRONULAC) 10 GM/15ML solution Take by mouth daily. Give 15 mL by mouth    [provider]  metoCLOPramide (REGLAN) 5 MG tablet Take 5 mg by mouth 2 (two) times daily.    [provider]  ondansetron (ZOFRAN) 8 MG tablet Take 8 mg by mouth every 8 (eight) hours as needed for nausea or vomiting.    [provider]  OXYGEN Inhale 2 L/min into  the lungs continuous.     [provider]  SYMBICORT 160-4.5 MCG/ACT inhaler Inhale 2 puffs into the lungs 2 (two) times daily. Rinse mouth well after use 09/04/15   [provider]  traZODone (DESYREL) 50 MG tablet Take 25 mg by mouth at bedtime.     [provider]  vitamin B-12 (CYANOCOBALAMIN) 1000 MCG tablet Take 1,000 mcg by mouth daily.    [provider]    Family History Family History  Problem Relation Age of Onset  . Diabetes Mother   . Colon cancer Father   . Diabetes Brother   . Diabetes Sister   . Lung cancer Brother   . Cervical cancer Sister     Social History Social History   Tobacco Use  . Smoking status: Never Smoker  .  Smokeless tobacco: Never Used  Substance Use Topics  . Alcohol use: No  . Drug use: No     Allergies   Aspirin; Ibuprofen; Contrast media [iodinated diagnostic agents]; and Pravachol   Review of Systems Review of Systems  All other systems reviewed and are negative.    Physical Exam Updated Vital Signs BP 108/70 (BP Location: Right Arm)   Pulse 75   Temp 98.7 F (37.1 C) (Oral)   Resp 16   SpO2 96%   Physical Exam  Constitutional: She is oriented to person, place, and time. She appears well-developed and well-nourished. No distress.  Obese  HENT:  Head: Normocephalic and atraumatic.  Mouth/Throat: Oropharynx is clear and moist.  2 small lesions on the tongue and gum with dried blood.  Dry mucous membranes  Eyes: Pupils are equal, round, and reactive to light. Conjunctivae and EOM are normal.  Neck: Normal range of motion. Neck supple.  Cardiovascular: Normal rate, regular rhythm and intact distal pulses.  No murmur heard. Pulmonary/Chest: Effort normal and breath sounds normal. No respiratory distress. She has no wheezes. She has no rales.  Abdominal: Soft. She exhibits no distension. There is no tenderness. There is no rebound and no guarding.  Genitourinary:     Genitourinary  Comments: Blood present mixed with brown stool on rectal exam  Musculoskeletal: Normal range of motion. She exhibits no edema or tenderness.  Small areas of healing ecchymosis over the upper arms  Neurological: She is alert and oriented to person, place, and time.  Moves upper extremities without difficulty.  Lower extremities she does not move freely  Skin: Skin is warm and dry. No rash noted. No erythema. There is pallor.  Psychiatric:  Quiet but will answer yes and no questions  Nursing note and vitals reviewed.    ED Treatments / Results  Labs (all labs ordered are listed, but only abnormal results are displayed) Labs Reviewed  CBC WITH DIFFERENTIAL/PLATELET - Abnormal; Notable for the following components:      Result Value   WBC 11.8 (*)    MCH 25.7 (*)    Neutro Abs 9.2 (*)    All other components within normal limits  COMPREHENSIVE METABOLIC PANEL - Abnormal; Notable for the following components:   Chloride 98 (*)    Glucose, Bld 148 (*)    BUN 22 (*)    Albumin 3.2 (*)    AST 14 (*)    ALT 10 (*)    All other components within normal limits  PROTIME-INR - Abnormal; Notable for the following components:   Prothrombin Time 19.6 (*)    All other components within normal limits  APTT - Abnormal; Notable for the following components:   aPTT 37 (*)    All other components within normal limits  TYPE AND SCREEN    EKG None  Radiology No results found.  Procedures Procedures (including critical care time)  Medications Ordered in ED Medications - No data to display   Initial Impression / Assessment and Plan / ED Course  I have reviewed the triage vital signs and the nursing notes.  Pertinent labs & imaging results that were available during my care of the patient were reviewed by me and considered in my medical decision making (see chart for details).     Elderly female with multiple medical problems presenting today with evidence of lower GI bleeding.   Patient has frank blood mixed with stool on exam but no reproducible abdominal pain.  Patient does  take Eliquis for atrial fibrillation and does have a prior history of lower GI bleeding.  She denies any shortness of breath or chest pain.  Vital signs are reassuring.  Symptoms started within the last 12 hours as patient had normal diapers yesterday per facility.  Patient last was seen for GI bleeding in 2014 thought to be due to a diverticular bleed in the setting of anticoagulation. CBC, CMP, INR, PTT and type and screen pending  3:01 PM Initial hemoglobin is stable at 12.  CMP without acute findings and PT T and INR as above.  Feel that patient would benefit from observation to ensure bleeding does not continue when hemoglobin remains stable.  Also patient will need to hold Eliquis.  Final Clinical Impressions(s) / ED Diagnoses   Final diagnoses:  Lower GI bleed    ED Discharge Orders    None       Blanchie Dessert, MD 03/18/18 1502

## 2018-03-19 ENCOUNTER — Encounter (HOSPITAL_COMMUNITY): Payer: Self-pay

## 2018-03-19 DIAGNOSIS — E1169 Type 2 diabetes mellitus with other specified complication: Secondary | ICD-10-CM

## 2018-03-19 DIAGNOSIS — K5731 Diverticulosis of large intestine without perforation or abscess with bleeding: Secondary | ICD-10-CM

## 2018-03-19 DIAGNOSIS — G309 Alzheimer's disease, unspecified: Secondary | ICD-10-CM

## 2018-03-19 DIAGNOSIS — K922 Gastrointestinal hemorrhage, unspecified: Secondary | ICD-10-CM

## 2018-03-19 DIAGNOSIS — J449 Chronic obstructive pulmonary disease, unspecified: Secondary | ICD-10-CM

## 2018-03-19 DIAGNOSIS — J9611 Chronic respiratory failure with hypoxia: Secondary | ICD-10-CM

## 2018-03-19 DIAGNOSIS — F0280 Dementia in other diseases classified elsewhere without behavioral disturbance: Secondary | ICD-10-CM

## 2018-03-19 DIAGNOSIS — E669 Obesity, unspecified: Secondary | ICD-10-CM

## 2018-03-19 LAB — BASIC METABOLIC PANEL
Anion gap: 9 (ref 5–15)
BUN: 29 mg/dL — ABNORMAL HIGH (ref 6–20)
CALCIUM: 9.2 mg/dL (ref 8.9–10.3)
CO2: 29 mmol/L (ref 22–32)
CREATININE: 0.74 mg/dL (ref 0.44–1.00)
Chloride: 96 mmol/L — ABNORMAL LOW (ref 101–111)
Glucose, Bld: 214 mg/dL — ABNORMAL HIGH (ref 65–99)
Potassium: 4.5 mmol/L (ref 3.5–5.1)
Sodium: 134 mmol/L — ABNORMAL LOW (ref 135–145)

## 2018-03-19 LAB — CBC WITH DIFFERENTIAL/PLATELET
BASOS ABS: 0 10*3/uL (ref 0.0–0.1)
BASOS PCT: 0 %
BASOS PCT: 0 %
Basophils Absolute: 0 10*3/uL (ref 0.0–0.1)
EOS ABS: 0 10*3/uL (ref 0.0–0.7)
EOS PCT: 0 %
Eosinophils Absolute: 0 10*3/uL (ref 0.0–0.7)
Eosinophils Relative: 0 %
HCT: 34.4 % — ABNORMAL LOW (ref 36.0–46.0)
HEMATOCRIT: 33 % — AB (ref 36.0–46.0)
Hemoglobin: 10.9 g/dL — ABNORMAL LOW (ref 12.0–15.0)
Hemoglobin: 10.9 g/dL — ABNORMAL LOW (ref 12.0–15.0)
Lymphocytes Relative: 8 %
Lymphocytes Relative: 9 %
Lymphs Abs: 0.6 10*3/uL — ABNORMAL LOW (ref 0.7–4.0)
Lymphs Abs: 0.5 10*3/uL — ABNORMAL LOW (ref 0.7–4.0)
MCH: 25.7 pg — ABNORMAL LOW (ref 26.0–34.0)
MCH: 26.3 pg (ref 26.0–34.0)
MCHC: 31.7 g/dL (ref 30.0–36.0)
MCHC: 33 g/dL (ref 30.0–36.0)
MCV: 81.1 fL (ref 78.0–100.0)
MCV: 79.7 fL (ref 78.0–100.0)
MONO ABS: 0.2 10*3/uL (ref 0.1–1.0)
MONO ABS: 0.1 10*3/uL (ref 0.1–1.0)
MONOS PCT: 2 %
Monocytes Relative: 1 %
NEUTROS ABS: 5.7 10*3/uL (ref 1.7–7.7)
NEUTROS PCT: 90 %
Neutro Abs: 6.8 10*3/uL (ref 1.7–7.7)
Neutrophils Relative %: 90 %
Platelets: 14 10*3/uL — CL (ref 150–400)
RBC: 4.24 MIL/uL (ref 3.87–5.11)
RBC: 4.14 MIL/uL (ref 3.87–5.11)
RDW: 15.3 % (ref 11.5–15.5)
RDW: 15.1 % (ref 11.5–15.5)
WBC: 7.6 10*3/uL (ref 4.0–10.5)
WBC: 6.2 10*3/uL (ref 4.0–10.5)

## 2018-03-19 LAB — CBC
HEMATOCRIT: 39.9 % (ref 36.0–46.0)
HEMOGLOBIN: 12.6 g/dL (ref 12.0–15.0)
MCH: 25.9 pg — ABNORMAL LOW (ref 26.0–34.0)
MCHC: 31.6 g/dL (ref 30.0–36.0)
MCV: 82.1 fL (ref 78.0–100.0)
Platelets: <5 10*3/uL — CL (ref 150–400)
RBC: 4.86 MIL/uL (ref 3.87–5.11)
RDW: 15.3 % (ref 11.5–15.5)
WBC: 13.7 10*3/uL — AB (ref 4.0–10.5)

## 2018-03-19 LAB — PREPARE PLATELET PHERESIS
UNIT DIVISION: 0
UNIT DIVISION: 0

## 2018-03-19 LAB — MRSA PCR SCREENING: MRSA BY PCR: POSITIVE — AB

## 2018-03-19 LAB — BPAM PLATELET PHERESIS
BLOOD PRODUCT EXPIRATION DATE: 201904082359
BLOOD PRODUCT EXPIRATION DATE: 201904082359
ISSUE DATE / TIME: 201904071957
ISSUE DATE / TIME: 201904072158
UNIT TYPE AND RH: 6200
UNIT TYPE AND RH: 9500

## 2018-03-19 LAB — VITAMIN B12: Vitamin B-12: 568 pg/mL (ref 180–914)

## 2018-03-19 LAB — SAVE SMEAR

## 2018-03-19 LAB — CBG MONITORING, ED: GLUCOSE-CAPILLARY: 191 mg/dL — AB (ref 65–99)

## 2018-03-19 MED ORDER — CHLORHEXIDINE GLUCONATE CLOTH 2 % EX PADS
6.0000 | MEDICATED_PAD | Freq: Every day | CUTANEOUS | Status: DC
  Administered 2018-03-20 – 2018-03-23 (×4): 6 via TOPICAL

## 2018-03-19 MED ORDER — MUPIROCIN 2 % EX OINT
1.0000 "application " | TOPICAL_OINTMENT | Freq: Two times a day (BID) | CUTANEOUS | Status: DC
  Administered 2018-03-19 – 2018-03-22 (×7): 1 via NASAL
  Filled 2018-03-19: qty 22

## 2018-03-19 NOTE — ED Notes (Signed)
Stretcher locked and in low position,Side rails upright. Call light given and within reach.  Comfort measures provided. °

## 2018-03-19 NOTE — Progress Notes (Signed)
PROGRESS NOTE    [name]  NAT:[id number] DOB: [birth year] DOA: 03/18/2018 PCP: [name], DO    Brief Narrative:  [name] is a 77 y.o. female with history of diabetes mellitus type 2, COPD, OSA, chronic diastolic heart failure, persistent atrial fibrillation on Eliquis, dementia, and diverticulosis  who presented from skilled nursing with rectal bleeding. On admission Labs showed White blood cell count 11.8, hemoglobin 12.8, platelets less than 5.  INR 1.67 (on Eliquis).  Rectal exam demonstrated obvious stool mixed with red and dark red blood. She was admitted to stepdown for further evaluation. Hematology consulted and recommendations given.         Assessment & Plan:   Principal Problem:   Thrombocytopenia ([place]) Active Problems:   Obstructive sleep apnea   COPD (chronic obstructive pulmonary disease) (HCC)   Chronic respiratory failure with hypoxia (HCC)   Chronic atrial fibrillation (HCC)   Chronic anticoagulation, on Eliquis   Chronic diastolic CHF (congestive heart failure) ([place])   Diabetes mellitus type 2 in obese (HCC)   Dementia without behavioral disturbance   Diverticular hemorrhage     Rectal bleed Possibly sec to severe thrombocytopenia, with underlying diverticulosis and being on eliquis.  She underwent 2 units of platelet transfusion and repeat cbc is pending.  Fibrinogen is 553 D dimer  And anti platelet antibodies pending.  Hold all anti coagulants, anti platelet agents.    Severe thrombocytopenia with platelet count less than 5000: S/p 2 units of platelet transfusion, repeat count is pending.  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  Hematology consulted and recommendations given to start her on IVIG for 2 days and IV steroids for 4 days. Hopefully she responds in the next 48 hours, if she does not respond, will probably need bone marrow biopsy. Discussed the above with the patient's son at bedside and answered all his questions.      Acute blood loss anemia:  Baseline hemoglobin is around 12, stable around 10.  Transfuse to keep hemoglobin greater than 7.   Dementia without behavioral disturbance: No signs of agitation.   Chronic atrial fibrillation :  Rate controlled. On eliquis prior to admission, which is being held for severe thrombocytopenia.    COPD: No wheezing heard.    Chronic respiratory failure: Stable.   Type 2 Diabetes mellitus:  CBG (last 3)  Recent Labs    03/19/18 0835  GLUCAP 191*   Resume SSI.       DVT prophylaxis: none, severe thrombocytopenia.  Code Status:  Full code.  Family Communication: discussed with son at bedside in ED.  Disposition Plan: clinical improvement and improvement in platelet count.    Consultants:   Hematology.    Procedures:none.    Antimicrobials: none.    Subjective: No chest pain or sob. Not in any kind of distress.   Objective: Vitals:   03/19/18 1000 03/19/18 1100 03/19/18 1130 03/19/18 1200  BP: 125/78 112/73 116/62   Pulse: (!) 59 (!) 48 60   Resp: (!) 22 17 18    Temp:    98.4 F (36.9 C)  TempSrc:    Oral  SpO2: 97% 97% 99%   Weight:      Height:        Intake/Output Summary (Last 24 hours) at 03/19/2018 1320 Last data filed at 03/19/2018 0819 Gross per 24 hour  Intake 2526 ml  Output -  Net 2526 ml   Filed Weights   03/18/18 2026 03/19/18 0841  Weight: 88.5 kg (  195 lb) 90.7 kg (200 lb)    Examination:  General exam: Appears somnolent. Dry mouth and mucous membranes.  Respiratory system: diminished at bases.  Respiratory effort normal. Cardiovascular system: S1 & S2 heard, RRR. No JVD, murmurs, . No pedal edema. Gastrointestinal system: Abdomen is nondistended, soft and nontender. No organomegaly or masses felt. Normal bowel sounds heard. Central nervous system: somnolent, woke up briefly Extremities: trace edema, no cyanosis or clubbing.  Skin: bruising over the left arm.  Psychiatry: couldn't be assessed.      Data Reviewed: I have personally reviewed following labs and imaging studies  CBC: Recent Labs  Lab 03/18/18 1410 03/18/18 1728 03/19/18 0816 03/19/18 1246  WBC 11.8* 13.7* 7.6 6.2  NEUTROABS 9.2*  --  6.8 5.7  HGB 12.8 12.6 10.9* 10.9*  HCT 40.5 39.9 34.4* 33.0*  MCV 81.2 82.1 81.1 79.7  PLT <5* <5* <5* 14*   Basic Metabolic Panel: Recent Labs  Lab 03/18/18 1410 03/19/18 0816  NA 140 134*  K 4.3 4.5  CL 98* 96*  CO2 32 29  GLUCOSE 148* 214*  BUN 22* 29*  CREATININE 0.83 0.74  CALCIUM 9.6 9.2   GFR: Estimated Creatinine Clearance: 59.5 mL/min (by C-G formula based on SCr of 0.74 mg/dL). Liver Function Tests: Recent Labs  Lab 03/18/18 1410  AST 14*  ALT 10*  ALKPHOS 63  BILITOT 0.7  PROT 6.7  ALBUMIN 3.2*   No results for input(s): LIPASE, AMYLASE in the last 168 hours. No results for input(s): AMMONIA in the last 168 hours. Coagulation Profile: Recent Labs  Lab 03/18/18 1410  INR 1.67   Cardiac Enzymes: No results for input(s): CKTOTAL, CKMB, CKMBINDEX, TROPONINI in the last 168 hours. BNP (last 3 results) No results for input(s): PROBNP in the last 8760 hours. HbA1C: No results for input(s): HGBA1C in the last 72 hours. CBG: Recent Labs  Lab 03/19/18 0835  GLUCAP 191*   Lipid Profile: No results for input(s): CHOL, HDL, LDLCALC, TRIG, CHOLHDL, LDLDIRECT in the last 72 hours. Thyroid Function Tests: No results for input(s): TSH, T4TOTAL, FREET4, T3FREE, THYROIDAB in the last 72 hours. Anemia Panel: Recent Labs    03/19/18 0822  VITAMINB12 568   Sepsis Labs: No results for input(s): PROCALCITON, LATICACIDVEN in the last 168 hours.  No results found for this or any previous visit (from the past 240 hour(s)).       Radiology Studies: Dg Chest Port 1 View  Result Date: 03/18/2018 CLINICAL DATA:  Shortness of breath today EXAM: PORTABLE CHEST 1 VIEW COMPARISON:  October 05, 2015 FINDINGS: The heart size and mediastinal contours  are within normal limits. Mild chronic increased pulmonary interstitium is identified bilaterally unchanged. There is no focal pneumonia or pleural effusion. The visualized skeletal structures are stable. IMPRESSION: No active cardiopulmonary disease. Electronically Signed   By: Abelardo Diesel M.D.   On: 03/18/2018 17:27        Scheduled Meds: . bacitracin   Topical BID  . dexamethasone  20 mg Intravenous Q24H  . donepezil  10 mg Oral QHS  . furosemide  20 mg Oral Daily  . liver oil-zinc oxide   Topical TID  . metoCLOPramide  5 mg Oral BID  . mometasone-formoterol  2 puff Inhalation BID  . polyvinyl alcohol  1 drop Both Eyes TID  . venlafaxine XR  37.5 mg Oral Q breakfast   Continuous Infusions: . IMMUNE GLOBULIN 10% (HUMAN) IV - For Fluid Restriction Only Stopped (03/19/18 0645)  LOS: 1 day    Time spent: 35 minutes.     Hosie Poisson, MD [facility] Pager [phone]  If 7PM-7AM, please contact night-coverage [email or web address] Password [email or web address] 03/19/2018, 1:20 PM

## 2018-03-19 NOTE — ED Notes (Signed)
Stretcher locked and in low position,Side rails upright. Call light given and within reach.  Comfort measures provided. Family at bedside.

## 2018-03-19 NOTE — Progress Notes (Signed)
Patient declines nocturnal CPAP. Family at bedside agrees that she is noncompliant even at home. Order changed to prn per RT protocol.

## 2018-03-19 NOTE — Progress Notes (Signed)
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 Also received platelet infusion Continue to monitor the platelet counts. It could take several days for the numbers to improve. Transfuse platelets for bleeding Will follow

## 2018-03-19 NOTE — Clinical Social Work Note (Addendum)
Clinical Social Work Assessment  Patient Details  Name: [name] MRN: [id number] Date of Birth: Aug 13, 1941  Date of referral:  03/19/18               Reason for consult:  Discharge Planning                Permission sought to share information with:    Permission granted to share information::     Name::        Agency::     Relationship::     Contact Information:     Housing/Transportation Living arrangements for the past 2 months:  [place] of Information:  Adult Children([name], [name]) Patient Interpreter Needed:  None Criminal Activity/Legal Involvement Pertinent to Current Situation/Hospitalization:    Significant Relationships:  Adult Children Lives with:  Facility Resident Do you feel safe going back to the place where you live?  Yes Need for family participation in patient care:     Care giving concerns:  Patient from [facility], admitted to hospital due to bloody stool. She arrives here in no distress. She is oriented to her situation and tells [place] she is not having any pain nor discomfort.    Social Worker assessment / plan:  CSW intern met with patients son [name], [phone] via bedside to discuss discharge planning. Sharon informed this Probation officer that patient has been residing at [facility] for about three years now in there long-term care. Sharon informed this Probation officer that the plan is for patient to return to [facility] once stable for discharge.   CSW intern assessed for supports outside of the hospital and [name] informed this Probation officer that patient has several family members as supports.   Employment status:  Retired Nurse, adult PT Recommendations:  Not assessed at this time Information / Referral to community resources:  [place]  Patient/Family's Response to care:  Patient and family are appreciative of care being provided by the ED team.  Patient/Family's Understanding of  and Emotional Response to Diagnosis, Current Treatment, and Prognosis:  Patient and family understand the current diagnosis and treatment plan in place. Patient and family currently understand patients functional abilities.  Emotional Assessment Appearance:  Appears stated age Attitude/Demeanor/Rapport:    Affect (typically observed):  Unable to Assess Orientation:   to situation Alcohol / Substance use:    Psych involvement (Current and /or in the community):  No (Comment)  Discharge Needs  Concerns to be addressed:  No discharge needs identified Readmission within the last 30 days:  No Current discharge risk:  None Barriers to Discharge:  No Barriers Identified   [name], Student-Social Work 03/19/2018, 10:05 AM

## 2018-03-19 NOTE — ED Notes (Signed)
ED TO INPATIENT HANDOFF REPORT  Name/Age/Gender [name] 77 y.o. female  Code Status    Code Status Orders  (From admission, onward)        Start     Ordered   03/18/18 1738  Full code  Continuous     03/18/18 1739    Code Status History    Date Active Date Inactive Code Status Order ID Comments User Context   10/05/2015 2108 10/09/2015 2303 Full Code [id number]  Lavina Hamman, MD ED   04/17/2014 2008 04/23/2014 1707 Full Code [id number]  Elgergawy, Silver Huguenin, MD ED   01/24/2014 1818 01/30/2014 1618 Full Code [id number]  Brett Canales, PA-C Inpatient   11/01/2013 0103 11/07/2013 1849 Full Code [id number]  Louellen Molder, MD Inpatient   08/19/2013 0152 08/26/2013 2031 Full Code [id number]  Phillips Grout, MD Inpatient   05/03/2013 1751 05/14/2013 1910 Full Code [id number]  Rama, Venetia Maxon, MD Inpatient    Advance Directive Documentation     Most Recent Value  Type of Advance Directive  -- [son tim hcpoa]  Pre-existing out of facility DNR order (yellow form or pink MOST form)  -  "MOST" Form in Place?  -      Home/SNF/Other Home  Chief Complaint GI Bleed  Level of Care/Admitting Diagnosis ED Disposition    ED Disposition Condition [place]: [facility] [100102]  Level of Care: Stepdown [14]  Admit to SDU based on following criteria: Other see comments  Comments: at risk for severe hemorrhage  Diagnosis: Thrombocytopenia ([place]) [063016]  Admitting Physician: Janece Canterbury [phone]  Attending Physician: Janece Canterbury [phone]  Estimated length of stay: 5 - 7 days  Certification:: I certify this patient will need inpatient services for at least 2 midnights  PT Class (Do Not Modify): Inpatient [101]  PT Acc Code (Do Not Modify): Private [1]       Medical History Past Medical History:  Diagnosis Date  . Anemia   . Asthma   . Atrial fibrillation, persistent ([place]) 11/01/2013  . Breast cancer, stage 1 ([place]) 10/17/2011   s/p  right lumpectomy and XRT  . Bronchitis   . Bruises easily   . Chills   . Chronic anticoagulation, on Eliquis 11/01/2013  . Chronic bronchitis ([place])   . Chronic diastolic CHF (congestive heart failure) ([place])   . Constipation   . COPD (chronic obstructive pulmonary disease) ([place])   . Cough   . Dementia without behavioral disturbance   . Dyslipidemia   . Fracture of left lower leg 06/1972   "no surgery; just casted it"  . Generalized weakness   . Gout attack    ankle, then wrist and hands  . Gout without tophus   . Hernia   . History of nuclear stress test 02/09/2010   dipyridamole; normal pattern of perfusion in all regions with attenuation artifact in inferior region, low risk   . Hypertension   . Insomnia   . Morbid obesity ([place])   . On home oxygen therapy    "2L prn" (01/24/2014)  . OSA on CPAP   . Pneumonia 2012; 2014  . Protein calorie malnutrition ([place])   . Sarcoid   . Sarcoidosis   . Type II diabetes mellitus ([place])   . Uterine cancer [facility])    s/p hysterectomy  . Wheezing     Allergies Allergies  Allergen Reactions  . Aspirin Other (See Comments)    Avoids due to being  on blood thinners  . Ibuprofen Other (See Comments)    Avoids due to being on blood thinners  . Contrast Media [Iodinated Diagnostic Agents] Nausea And Vomiting  . Pravachol Other (See Comments)    Muscle pain    IV Location/Drains/Wounds Patient Lines/Drains/Airways Status   Active Line/Drains/Airways    Name:   Placement date:   Placement time:   Site:   Days:   Peripheral IV 03/18/18 Left;Posterior Forearm   03/18/18    1412    Forearm   1   Peripheral IV 03/19/18 Left Antecubital   03/19/18    0112    Antecubital   less than 1          Labs/Imaging Results for orders placed or performed during the hospital encounter of 03/18/18 (from the past 48 hour(s))  CBC with Differential/Platelet     Status: Abnormal   Collection Time: 03/18/18  2:10 PM  Result Value Ref Range   WBC 11.8 (H)  4.0 - 10.5 K/uL   RBC 4.99 3.87 - 5.11 MIL/uL   Hemoglobin 12.8 12.0 - 15.0 g/dL   HCT 40.5 36.0 - 46.0 %   MCV 81.2 78.0 - 100.0 fL   MCH 25.7 (L) 26.0 - 34.0 pg   MCHC 31.6 30.0 - 36.0 g/dL   RDW 15.2 11.5 - 15.5 %   Platelets <5 (LL) 150 - 400 K/uL    Comment: SPECIMEN CHECKED FOR CLOTS REPEATED TO VERIFY CRITICAL RESULT CALLED TO, READ BACK BY AND VERIFIED WITH: medina ENJOSEPH BLACK,M 03/18/18 16:06  PLATELET COUNT CONFIRMED BY SMEAR    Neutrophils Relative % 77 %   Neutro Abs 9.2 (H) 1.7 - 7.7 K/uL   Lymphocytes Relative 12 %   Lymphs Abs 1.4 0.7 - 4.0 K/uL   Monocytes Relative 8 %   Monocytes Absolute 0.9 0.1 - 1.0 K/uL   Eosinophils Relative 3 %   Eosinophils Absolute 0.4 0.0 - 0.7 K/uL   Basophils Relative 0 %   Basophils Absolute 0.0 0.0 - 0.1 K/uL    Comment: Performed at [facility], [place] [address] [place], [place] [address]  Comprehensive metabolic panel     Status: Abnormal   Collection Time: 03/18/18  2:10 PM  Result Value Ref Range   Sodium 140 135 - 145 mmol/L   Potassium 4.3 3.5 - 5.1 mmol/L   Chloride 98 (L) 101 - 111 mmol/L   CO2 32 22 - 32 mmol/L   Glucose, Bld 148 (H) 65 - 99 mg/dL   BUN 22 (H) 6 - 20 mg/dL   Creatinine, Ser 0.83 0.44 - 1.00 mg/dL   Calcium 9.6 8.9 - 10.3 mg/dL   Total Protein 6.7 6.5 - 8.1 g/dL   Albumin 3.2 (L) 3.5 - 5.0 g/dL   AST 14 (L) 15 - 41 U/L   ALT 10 (L) 14 - 54 U/L   Alkaline Phosphatase 63 38 - 126 U/L   Total Bilirubin 0.7 0.3 - 1.2 mg/dL   GFR calc non Af Amer >60 >60 mL/min   GFR calc Af Amer >60 >60 mL/min    Comment: (NOTE) The eGFR has been calculated using the CKD EPI equation. This calculation has not been validated in all clinical situations. eGFR's persistently <60 mL/min signify possible Chronic Kidney Disease.    Anion gap 10 5 - 15    Comment: Performed at [facility], [place] [address] [place], [place] [address]  Protime-INR     Status: Abnormal  Collection  Time: 03/18/18  2:10 PM  Result Value Ref Range   Prothrombin Time 19.6 (H) 11.4 - 15.2 seconds   INR 1.67     Comment: Performed at [facility], [place] [address] [place], [place] [address]  APTT     Status: Abnormal   Collection Time: 03/18/18  2:10 PM  Result Value Ref Range   aPTT 37 (H) 24 - 36 seconds    Comment:        IF BASELINE aPTT IS ELEVATED, SUGGEST PATIENT RISK ASSESSMENT BE USED TO DETERMINE APPROPRIATE ANTICOAGULANT THERAPY. Performed at [facility], [place] [address] [place], [place] [address]   Type and screen     Status: None   Collection Time: 03/18/18  2:10 PM  Result Value Ref Range   ABO/RH(D) O NEG    Antibody Screen NEG    Sample Expiration      03/21/2018 Performed at [facility], [place] [address], [place], [place] [address]   Prepare Pheresed Platelets     Status: None   Collection Time: 03/18/18  4:27 PM  Result Value Ref Range   Unit Number [id number]    Blood Component Type PLTPHER LR2    Unit division 00    Status of Unit ISSUED,FINAL    Transfusion Status      OK TO TRANSFUSE Performed at [place] [address] [place], [place] [address]    Unit Number [id number]    Blood Component Type PLTPHER LR2    Unit division 00    Status of Unit ISSUED,FINAL    Transfusion Status OK TO TRANSFUSE   CBC     Status: Abnormal   Collection Time: 03/18/18  5:28 PM  Result Value Ref Range   WBC 13.7 (H) 4.0 - 10.5 K/uL   RBC 4.86 3.87 - 5.11 MIL/uL   Hemoglobin 12.6 12.0 - 15.0 g/dL   HCT 39.9 36.0 - 46.0 %   MCV 82.1 78.0 - 100.0 fL   MCH 25.9 (L) 26.0 - 34.0 pg   MCHC 31.6 30.0 - 36.0 g/dL   RDW 15.3 11.5 - 15.5 %   Platelets <5 (LL) 150 - 400 K/uL    Comment: CONSISTENT WITH PREVIOUS RESULT CRITICAL VALUE NOTED.  VALUE IS CONSISTENT WITH PREVIOUSLY REPORTED AND CALLED VALUE. Performed at [facility], [place] [address], [place], [place]  [address]   Fibrinogen     Status: Abnormal   Collection Time: 03/18/18  5:28 PM  Result Value Ref Range   Fibrinogen 553 (H) 210 - 475 mg/dL    Comment: Performed at [facility], [place] [address] [place], [place] [address]  Technologist smear review     Status: None   Collection Time: 03/18/18  5:28 PM  Result Value Ref Range   Tech Review MORPHOLOGY UNREMARKABLE     Comment: PLATELET COUNT CONFIRMED BY SMEAR Performed at [facility] [address] [place], [place] [address]   Save smear     Status: None   Collection Time: 03/18/18  5:28 PM  Result Value Ref Range   Smear Review SMEAR STAINED AND AVAILABLE FOR REVIEW     Comment: Performed at [facility], [place] [address], [place],  [address]  CBC with Differential/Platelet     Status: Abnormal   Collection Time: 03/19/18  8:16 AM  Result Value Ref Range   WBC 7.6 4.0 - 10.5 K/uL  RBC 4.24 3.87 - 5.11 MIL/uL   Hemoglobin 10.9 (L) 12.0 - 15.0 g/dL   HCT 34.4 (L) 36.0 - 46.0 %   MCV 81.1 78.0 - 100.0 fL   MCH 25.7 (L) 26.0 - 34.0 pg   MCHC 31.7 30.0 - 36.0 g/dL   RDW 15.3 11.5 - 15.5 %   Platelets <5 (LL) 150 - 400 K/uL    Comment: REPEATED TO VERIFY SPECIMEN CHECKED FOR CLOTS CONSISTENT WITH PREVIOUS RESULT CRITICAL VALUE NOTED.  VALUE IS CONSISTENT WITH PREVIOUSLY REPORTED AND CALLED VALUE.    Neutrophils Relative % 90 %   Neutro Abs 6.8 1.7 - 7.7 K/uL   Lymphocytes Relative 8 %   Lymphs Abs 0.6 (L) 0.7 - 4.0 K/uL   Monocytes Relative 2 %   Monocytes Absolute 0.2 0.1 - 1.0 K/uL   Eosinophils Relative 0 %   Eosinophils Absolute 0.0 0.0 - 0.7 K/uL   Basophils Relative 0 %   Basophils Absolute 0.0 0.0 - 0.1 K/uL    Comment: Performed at [facility], [place] [address] [place], [place] [address]  Basic metabolic panel     Status: Abnormal   Collection Time: 03/19/18  8:16 AM  Result Value Ref Range   Sodium 134 (L) 135 - 145 mmol/L    Potassium 4.5 3.5 - 5.1 mmol/L   Chloride 96 (L) 101 - 111 mmol/L   CO2 29 22 - 32 mmol/L   Glucose, Bld 214 (H) 65 - 99 mg/dL   BUN 29 (H) 6 - 20 mg/dL   Creatinine, Ser 0.74 0.44 - 1.00 mg/dL   Calcium 9.2 8.9 - 10.3 mg/dL   GFR calc non Af Amer >60 >60 mL/min   GFR calc Af Amer >60 >60 mL/min    Comment: (NOTE) The eGFR has been calculated using the CKD EPI equation. This calculation has not been validated in all clinical situations. eGFR's persistently <60 mL/min signify possible Chronic Kidney Disease.    Anion gap 9 5 - 15    Comment: Performed at [facility], [place] [address], [place], [place] [address]  CBG monitoring, ED     Status: Abnormal   Collection Time: 03/19/18  8:35 AM  Result Value Ref Range   Glucose-Capillary 191 (H) 65 - 99 mg/dL   Dg Chest Port 1 View  Result Date: 03/18/2018 CLINICAL DATA:  Shortness of breath today EXAM: PORTABLE CHEST 1 VIEW COMPARISON:  October 05, 2015 FINDINGS: The heart size and mediastinal contours are within normal limits. Mild chronic increased pulmonary interstitium is identified bilaterally unchanged. There is no focal pneumonia or pleural effusion. The visualized skeletal structures are stable. IMPRESSION: No active cardiopulmonary disease. Electronically Signed   By: Abelardo Diesel M.D.   On: 03/18/2018 17:27    Pending Labs Unresulted Labs (From admission, onward)   Start     Ordered   03/19/18 0843  [id number] Activity  Once,   R     03/19/18 0843   03/19/18 0500  CBC with Differential/Platelet  Daily,   R    Question:  Specimen collection method  Answer:  IV Team   03/18/18 1732   03/19/18 8657  Basic metabolic panel  Daily,   R    Question:  Specimen collection method  Answer:  IV Team   03/18/18 1732   03/19/18 0500  Folate RBC  Tomorrow morning,   R     03/18/18 1845   03/19/18 0500  Vitamin B12  Tomorrow morning,  R     03/18/18 1845   03/18/18 1631  Platelet Antibody Profile  (Platelet Antibodies  PNL(DRCT+INDRCT) (PNL))  STAT,   R     03/18/18 1631      Vitals/Pain Today's Vitals   03/19/18 0841 03/19/18 0900 03/19/18 1000 03/19/18 1100  BP:  109/79 125/78 112/73  Pulse:  69 (!) 59 65  Resp:  17 (!) 22 18  Temp:      TempSrc:      SpO2:  98% 97% 98%  Weight: 200 lb (90.7 kg)     Height:      PainSc:        Isolation Precautions No active isolations  Medications Medications  bacitracin ointment ( Topical Not Given 03/19/18 0124)  liver oil-zinc oxide (DESITIN) 40 % ointment ( Topical Not Given 03/18/18 2300)  albuterol (PROVENTIL) (2.5 MG/3ML) 0.083% nebulizer solution 2.5 mg (has no administration in time range)  donepezil (ARICEPT) tablet 10 mg (10 mg Oral Not Given 03/18/18 2300)  metoCLOPramide (REGLAN) tablet 5 mg (5 mg Oral Not Given 03/18/18 2300)  mometasone-formoterol (DULERA) 200-5 MCG/ACT inhaler 2 puff (2 puffs Inhalation Not Given 03/19/18 1126)  venlafaxine XR (EFFEXOR-XR) 24 hr capsule 37.5 mg (has no administration in time range)  acetaminophen (TYLENOL) tablet 650 mg (has no administration in time range)    Or  acetaminophen (TYLENOL) suppository 650 mg (has no administration in time range)  oxyCODONE (Oxy IR/ROXICODONE) immediate release tablet 5 mg (has no administration in time range)  polyethylene glycol (MIRALAX / GLYCOLAX) packet 17 g (has no administration in time range)  bisacodyl (DULCOLAX) EC tablet 5 mg (has no administration in time range)  magnesium citrate solution 1 Bottle (has no administration in time range)  ondansetron (ZOFRAN) tablet 4 mg (has no administration in time range)    Or  ondansetron (ZOFRAN) injection 4 mg (has no administration in time range)  furosemide (LASIX) tablet 20 mg (has no administration in time range)  dexamethasone (DECADRON) injection 20 mg (20 mg Intravenous Given 03/19/18 0123)  Immune Globulin 10% (PRIVIGEN) IV infusion 90 g (0 g/kg  88.5 kg Intravenous Stopped 03/19/18 0645)  polyvinyl alcohol (LIQUIFILM TEARS)  1.4 % ophthalmic solution 1 drop (1 drop Both Eyes Not Given 03/18/18 2300)  0.9 %  sodium chloride infusion ( Intravenous Stopped 03/19/18 0819)    Mobility walks

## 2018-03-19 NOTE — ED Notes (Signed)
LABS TO BE DRAWN 1HR AFTER BLOOD PRODUCTS.

## 2018-03-20 DIAGNOSIS — Z515 Encounter for palliative care: Secondary | ICD-10-CM

## 2018-03-20 DIAGNOSIS — D693 Immune thrombocytopenic purpura: Secondary | ICD-10-CM

## 2018-03-20 DIAGNOSIS — Z7189 Other specified counseling: Secondary | ICD-10-CM

## 2018-03-20 LAB — BASIC METABOLIC PANEL
ANION GAP: 6 (ref 5–15)
BUN: 39 mg/dL — ABNORMAL HIGH (ref 6–20)
CO2: 30 mmol/L (ref 22–32)
CREATININE: 0.8 mg/dL (ref 0.44–1.00)
Calcium: 8.7 mg/dL — ABNORMAL LOW (ref 8.9–10.3)
Chloride: 96 mmol/L — ABNORMAL LOW (ref 101–111)
Glucose, Bld: 277 mg/dL — ABNORMAL HIGH (ref 65–99)
Potassium: 4.1 mmol/L (ref 3.5–5.1)
SODIUM: 132 mmol/L — AB (ref 135–145)

## 2018-03-20 LAB — CBC WITH DIFFERENTIAL/PLATELET
BASOS ABS: 0 10*3/uL (ref 0.0–0.1)
BASOS PCT: 0 %
EOS ABS: 0 10*3/uL (ref 0.0–0.7)
EOS PCT: 0 %
HCT: 28.9 % — ABNORMAL LOW (ref 36.0–46.0)
Hemoglobin: 9.4 g/dL — ABNORMAL LOW (ref 12.0–15.0)
Lymphocytes Relative: 5 %
Lymphs Abs: 0.5 10*3/uL — ABNORMAL LOW (ref 0.7–4.0)
MCH: 25.8 pg — ABNORMAL LOW (ref 26.0–34.0)
MCHC: 32.5 g/dL (ref 30.0–36.0)
MCV: 79.4 fL (ref 78.0–100.0)
MONO ABS: 0.3 10*3/uL (ref 0.1–1.0)
MONOS PCT: 2 %
Neutro Abs: 9.7 10*3/uL — ABNORMAL HIGH (ref 1.7–7.7)
Neutrophils Relative %: 93 %
PLATELETS: 56 10*3/uL — AB (ref 150–400)
RBC: 3.64 MIL/uL — ABNORMAL LOW (ref 3.87–5.11)
RDW: 14.5 % (ref 11.5–15.5)
WBC: 10.5 10*3/uL (ref 4.0–10.5)

## 2018-03-20 LAB — GLUCOSE, CAPILLARY: Glucose-Capillary: 200 mg/dL — ABNORMAL HIGH (ref 65–99)

## 2018-03-20 LAB — FOLATE RBC
FOLATE, HEMOLYSATE: 344.5 ng/mL
Folate, RBC: 1057 ng/mL (ref >498)
HEMATOCRIT: 32.6 % — AB (ref 34.0–46.6)

## 2018-03-20 LAB — ADAMTS13 ACTIVITY REFLEX

## 2018-03-20 LAB — ADAMTS13 ACTIVITY: ADAMTS 13 ACTIVITY: 86.5 % (ref >66.8)

## 2018-03-20 MED ORDER — DEXAMETHASONE SODIUM PHOSPHATE 10 MG/ML IJ SOLN
10.0000 mg | INTRAMUSCULAR | Status: AC
  Administered 2018-03-20 – 2018-03-21 (×2): 10 mg via INTRAVENOUS
  Filled 2018-03-20 (×2): qty 1

## 2018-03-20 MED ORDER — LORAZEPAM 0.5 MG PO TABS
0.5000 mg | ORAL_TABLET | Freq: Four times a day (QID) | ORAL | Status: DC | PRN
  Administered 2018-03-20 – 2018-03-22 (×3): 0.5 mg via ORAL
  Filled 2018-03-20 (×3): qty 1

## 2018-03-20 NOTE — Consult Note (Addendum)
Consultation Note Date: 03/20/2018   Patient Name: [name]  DOB: 03/17/1941  MRN: [id number]  Age / Sex: 77 y.o., female  PCP: [name], DO Referring Physician: Hosie Poisson, MD  Reason for Consultation: Establishing goals of care  HPI/Patient Profile: 77 y.o. female  with past medical history of dementia, afib on eliquis, breast and uterine cancer, DM, OSA on CPAP, obesity, HTN, diastolic CHF, COPD, sarcoidosis, anemia, asthma admitted on 03/18/2018 with rectal bleeding from SNF. On admission, labs revealed WBC 11.8, hemoglobin 12.8, platelets less than 5. INR 1.67 on eliquis. Rectal exam revealed stool with red/dark red blood. 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 Receiving IVIG x2 days and steroids. S/p 2 units of platelet transfusion. If patient does not respond to IVIG and steroids, may need CT r/o malignancy and/or bone marrow biopsy. Out of facility DNR but sons have requested FULL code this hospitalization. Palliative medicine consultation for goals of care.   Clinical Assessment and Goals of Care: I have reviewed medical records, discussed with care team, completed patient assessment and met with son [name]) in conference room to discuss diagnosis, [place], EOL wishes, disposition and options.  Patient is resting comfortably this afternoon. No s/s of pain or discomfort.   Introduced Palliative Medicine as specialized medical care for people living with serious illness. It focuses on providing relief from the symptoms and stress of a serious illness. The goal is to improve quality of life for both the patient and the family.  We discussed a brief life review of the patient. Patient is divorced. She has two adult sons. Worked for [employer] for many years. [name] speaks of dementia diagnosis about 5-6 years ago. He describes his mother as a very "clean" person. Tim and his brother Jonni Sanger) became  concerned with memory loss/dementia when they would find his mother's apartment messy. Very unlike her.   She has lived at [facility] for 3-4 years. Prior to this, living at ALF but required more care. Prior to hospitalization, baseline is wheelchair bound and requiring assist with ADL's. Appetite fair. She will feed herself with encouragement from staff.  Discussed hospital diagnoses and interventions. Also spent time discussing dementia trajectory and expectations from this chronic, progressive disease.   [name] has a good understanding of dementia disease trajectory. He also has a good understanding of hospital diagnosis and concern with severely low platelets. He recalls his conversation with Dr. Alvy Bimler with plan of IVIG x2 and steroids x4 days. He speaks of this being "viral" but if her platelets do not show improvement after interventions, he understands further testing for possible malignancy including CT and/or bone marrow biopsy.   Advanced directives, concepts specific to code status, artifical feeding and hydration, and rehospitalization were considered and discussed. Tim tells me he is documented medical/HCPOA. Brother Jonni Sanger) is very close with Tim and the patient. Jonni Sanger is also involved with decision making but [name] speaks of him being very "emotional" and wanting "everything done" including resuscitation/feeding tube. [name] tells me she had a 'DNR  in the facility' but they reversed this to FULL code and wish for "everything" to be done during this hospitalization.   I reviewed in detail MOST form and educated on medication recommendation against heroic measures (resuscitation/life support/feeding tube) at EOL with underlying dementia. Discussed risks with placing feeding tube in patient's with dementia. Tim tells me he does not want her to be a "vegetable" and speaks of a church member who had feeding tube with dementia and very poor quality of life. Tim seems realistic with expectations of  dementia but again speaks of his brothers emotions with not wanting to lose their mother. Tim (who is a Theme park manager) states "we all will die."   [place] copy given. Encouraged Tim to continuing conversations with brother, Jonni Sanger, regarding [place] and consider their mother's wishes regarding heroic measures. Tim understands they are faced with big decisions as dementia progresses and if platelets do not improve.   We also discussed underlying afib on eliquis. Risks with re-starting anticoagulation in the future with thrombocytopenia but also risk for clots/stroke without anticoagulation with afib.  At this point, [name] is "hoping for the best" and continued improvement in platelets. He also appreciates frank conversations regarding next steps if platelets do not improve.   Palliative Care services outpatient were explained and offered. [name] is agreeable with future follow-up from outpatient palliative. He understands this can easily transition to hospice services when appropriate.   Questions and concerns were addressed. PMT contact information given. Spiritual support provided.    SUMMARY OF RECOMMENDATIONS    Son/HCPOA wishes for continued FULL code/FULL scope.  Watchful waiting. Continue to treat the treatable. Son hopeful for improvement in platelet function. He also understands big decisions him and brother are faced with if platelet function does NOT improve.   Introduced and educated on MOST form. Educated on disease trajectory of dementia. Explained heroic measures including resuscitation/life support/feeding tube are not recommended for patients with underlying dementia. Son verbalizes to me he would NOT want her to live as a "vegetable."   Son interested in outpatient palliative follow-up at SNF with discharged.  PMT will follow clinical status.   Code Status/Advance Care Planning:  Full code-educated on medical recommendation for DNR with underlying dementia.  Symptom Management:    Per attending  Palliative Prophylaxis:   Aspiration, Delirium Protocol, Oral Care and Turn Reposition  Additional Recommendations (Limitations, Scope, Preferences):  Full Scope Treatment  Psycho-social/Spiritual:   Desire for further Chaplaincy support:yes  Additional Recommendations: Caregiving  Support/Resources  Prognosis:   Unable to determine  Discharge Planning: To Be Determined      Primary Diagnoses: Present on Admission: . Chronic atrial fibrillation ([place]) . Chronic diastolic CHF (congestive heart failure) ([place]) . Chronic respiratory failure with hypoxia ([place]) . COPD (chronic obstructive pulmonary disease) ([place]) . Dementia without behavioral disturbance . Diabetes mellitus type 2 in obese ([place]) . Obstructive sleep apnea   I have reviewed the medical record, interviewed the patient and family, and examined the patient. The following aspects are pertinent.  Past Medical History:  Diagnosis Date  . Anemia   . Asthma   . Atrial fibrillation, persistent ([place]) 11/01/2013  . Breast cancer, stage 1 ([place]) 10/17/2011   s/p right lumpectomy and XRT  . Bronchitis   . Bruises easily   . Chills   . Chronic anticoagulation, on Eliquis 11/01/2013  . Chronic bronchitis ([place])   . Chronic diastolic CHF (congestive heart failure) ([place])   . Constipation   . COPD (chronic obstructive pulmonary  disease) ([place])   . Cough   . Dementia without behavioral disturbance   . Dyslipidemia   . Fracture of left lower leg 06/1972   "no surgery; just casted it"  . Generalized weakness   . Gout attack    ankle, then wrist and hands  . Gout without tophus   . Hernia   . History of nuclear stress test 02/09/2010   dipyridamole; normal pattern of perfusion in all regions with attenuation artifact in inferior region, low risk   . Hypertension   . Insomnia   . Morbid obesity ([place])   . On home oxygen therapy    "2L prn" (01/24/2014)  . OSA on CPAP   . Pneumonia 2012; 2014  .  Protein calorie malnutrition ([place])   . Sarcoid   . Sarcoidosis   . Type II diabetes mellitus ([place])   . Uterine cancer [facility])    s/p hysterectomy  . Wheezing    Social History   Socioeconomic History  . Marital status: Divorced    Spouse name: Not on file  . Number of children: 2  . Years of education: Not on file  . Highest education level: Not on file  Occupational History  . Occupation: RETIRED    Employer: RETIRED    CommentBuilding services engineer  Social Needs  . Financial resource strain: Not on file  . Food insecurity:    Worry: Not on file    Inability: Not on file  . Transportation needs:    Medical: Not on file    Non-medical: Not on file  Tobacco Use  . Smoking status: Never Smoker  . Smokeless tobacco: Never Used  Substance and Sexual Activity  . Alcohol use: No  . Drug use: No  . Sexual activity: Never    Birth control/protection: Post-menopausal, Surgical  Lifestyle  . Physical activity:    Days per week: Not on file    Minutes per session: Not on file  . Stress: Not on file  Relationships  . Social connections:    Talks on phone: Not on file    Gets together: Not on file    Attends religious service: Not on file    Active member of club or organization: Not on file    Attends meetings of clubs or organizations: Not on file    Relationship status: Not on file  Other Topics Concern  . Not on file  Social History Narrative   Widowed.  Lives alone.  Ambulates with a walker.   Family History  Problem Relation Age of Onset  . Diabetes Mother   . Colon cancer Father   . Diabetes Brother   . Diabetes Sister   . Lung cancer Brother   . Cervical cancer Sister   . Thrombocytopenia Neg Hx   . Bleeding Disorder Neg Hx    Scheduled Meds: . bacitracin   Topical BID  . Chlorhexidine Gluconate Cloth  6 each Topical Q0600  . dexamethasone  20 mg Intravenous Q24H  . donepezil  10 mg Oral QHS  . furosemide  20 mg Oral Daily  . liver oil-zinc oxide   Topical TID  .  metoCLOPramide  5 mg Oral BID  . mometasone-formoterol  2 puff Inhalation BID  . mupirocin ointment  1 application Nasal BID  . polyvinyl alcohol  1 drop Both Eyes TID  . venlafaxine XR  37.5 mg Oral Q breakfast   Continuous Infusions: PRN Meds:.acetaminophen **OR** acetaminophen, albuterol, bisacodyl, magnesium citrate, ondansetron **OR** ondansetron (ZOFRAN)  IV, oxyCODONE, polyethylene glycol Medications Prior to Admission:  Prior to Admission medications   Medication Sig Start Date End Date Taking? Authorizing Provider  albuterol (PROVENTIL) (2.5 MG/3ML) 0.083% nebulizer solution Take by nebulization every 6 (six) hours as needed for wheezing or shortness of breath.   Yes [provider]  apixaban (ELIQUIS) 5 MG TABS tablet TAKE 1 TABLET (5 MG TOTAL) BY MOUTH 2 (TWO) TIMES DAILY. 10/22/14  Yes Hilty, Nadean Corwin, MD  Calcium Carb-Cholecalciferol 500-200 MG-UNIT TABS Take 1 tablet by mouth 2 (two) times daily. Reported on 01/26/2016   Yes [provider]  diltiazem (CARDIZEM CD) 240 MG 24 hr capsule Take 240 mg by mouth every morning.  01/20/14  Yes [provider]  docusate sodium (COLACE) 100 MG capsule Take 100 mg by mouth daily as needed for mild constipation.   Yes [provider]  donepezil (ARICEPT) 10 MG tablet Take 10 mg by mouth at bedtime.   Yes [provider]  furosemide (LASIX) 20 MG tablet Take 20 mg by mouth daily.   Yes [provider]  hydrocerin (EUCERIN) CREA Apply 1 application topically daily as needed (dry feet).    Yes [provider]  lactulose (CHRONULAC) 10 GM/15ML solution Take by mouth daily. Give 15 mL by mouth   Yes [provider]  metoCLOPramide (REGLAN) 5 MG tablet Take 5 mg by mouth 2 (two) times daily.   Yes [provider]  ondansetron (ZOFRAN) 8 MG tablet Take 8 mg by mouth every 8 (eight) hours as needed for nausea or vomiting.   Yes [provider]  OXYGEN Inhale 2  L/min into the lungs continuous.    Yes [provider]  Propylene Glycol (SYSTANE COMPLETE OP) Place 1 drop into both eyes 3 (three) times daily.   Yes [provider]  SYMBICORT 160-4.5 MCG/ACT inhaler Inhale 2 puffs into the lungs 2 (two) times daily. Rinse mouth well after use 09/04/15  Yes [provider]  venlafaxine XR (EFFEXOR-XR) 37.5 MG 24 hr capsule Take 37.5 mg by mouth daily with breakfast.   Yes [provider]  Vitamin D, Ergocalciferol, (DRISDOL) 50000 units CAPS capsule Take 50,000 Units by mouth every 28 (twenty-eight) days.   Yes [provider]   Allergies  Allergen Reactions  . Aspirin Other (See Comments)    Avoids due to being on blood thinners  . Ibuprofen Other (See Comments)    Avoids due to being on blood thinners  . Contrast Media [Iodinated Diagnostic Agents] Nausea And Vomiting  . Pravachol Other (See Comments)    Muscle pain   Review of Systems  Unable to perform ROS: Dementia   Physical Exam  Constitutional: She is easily aroused. She appears ill.  HENT:  Head: Normocephalic and atraumatic.  Cardiovascular: Regular rhythm.  Pulmonary/Chest: No accessory muscle usage. No tachypnea. No respiratory distress.  Neurological: She is alert and easily aroused.  Pleasantly confused  Skin: Skin is warm and dry.  Psychiatric: Her speech is delayed. She is inattentive.  Nursing note and vitals reviewed.   Vital Signs: BP (!) 122/57   Pulse (!) 44   Temp 97.9 F (36.6 C) (Oral)   Resp 18   Ht 5' 1"  (1.549 m)   Wt 89.4 kg (197 lb 1.5 oz)   SpO2 99%   BMI 37.24 kg/m  Pain Scale: 0-10   Pain Score: Asleep   SpO2: SpO2: 99 % O2 Device:SpO2: 99 % O2 Flow Rate: .O2 Flow Rate (  L/min): 2 L/min  IO: Intake/output summary:   Intake/Output Summary (Last 24 hours) at 03/20/2018 1625 Last data filed at 03/20/2018 1240 Gross per 24 hour  Intake 1718.28 ml  Output 1000 ml  Net 718.28 ml    LBM: Last BM Date:  03/19/18 Baseline Weight: Weight: 88.5 kg (195 lb) Most recent weight: Weight: 89.4 kg (197 lb 1.5 oz)     Palliative Assessment/Data: PPS 40%   Flowsheet Rows     Most Recent Value  Intake Tab  Referral Department  Hospitalist  Unit at Time of Referral  ICU  Palliative Care Primary Diagnosis  -- Lewanda Rife, severe thrombocytopenia]  Date Notified  03/18/18  Palliative Care Type  New Palliative care  Reason for referral  Clarify Goals of Care  Date of Admission  03/18/18  Date first seen by Palliative Care  03/20/18  # of days IP prior to Palliative referral  0  Clinical Assessment  Palliative Performance Scale Score  40%  Psychosocial & Spiritual Assessment  Palliative Care Outcomes  Patient/Family meeting held?  Yes  Who was at the meeting?  son [name])  [place] goals of care, Provided psychosocial or spiritual support, Counseled regarding hospice, ACP counseling assistance      Time In/Out: 1350-1505 Time Total: 71mn Greater than 50%  of this time was spent counseling and coordinating care related to the above assessment and plan.  Signed by:  MIhor Dow FNP-C Palliative Medicine Team  Phone: [phone]: [phone]  Please contact Palliative Medicine Team phone at [phone] questions and concerns.  For individual provider: See AShea Evans

## 2018-03-20 NOTE — Progress Notes (Signed)
PMT NP to meet with son [name]) at bedside at [facility], [facility] Palliative Medicine Team  Phone: [phone] Fax: [phone]

## 2018-03-20 NOTE — Progress Notes (Signed)
HEMATOLOGY-ONCOLOGY PROGRESS NOTE  SUBJECTIVE: Patient is in bed. Bruises on arms. Family notes that shes been quite tired but talks to them and has told them that shes been hungry. Rectal bleeding has improved  OBJECTIVE: REVIEW OF SYSTEMS:   Constitutional: Denies fevers, chills or abnormal weight loss Eyes: Denies blurriness of vision Ears, nose, mouth, throat, and face: Denies mucositis or sore throat Respiratory: Denies cough, dyspnea or wheezes Gastrointestinal:  Rectal bleeding Skin: bruises  Neurological:able to move all extremities Behavioral/Psych: Mood is stable, dementia  All other systems were reviewed with the patient and are negative.  I have reviewed the past medical history, past surgical history, social history and family history with the patient and they are unchanged from previous note.   PHYSICAL EXAMINATION: ECOG PERFORMANCE STATUS: 4 - Bedbound  Vitals:   03/20/18 1400 03/20/18 1625  BP: (!) 122/57   Pulse:    Resp: 18   Temp:  97.9 F (36.6 C)  SpO2:     Filed Weights   03/18/18 2026 03/19/18 0841 03/20/18 0500  Weight: 195 lb (88.5 kg) 200 lb (90.7 kg) 197 lb 1.5 oz (89.4 kg)    GENERAL:alert, no distress and comfortable SKIN: bruisings EYES: normal, Conjunctiva are pink and non-injected, sclera clear OROPHARYNX:no exudate, no erythema and lips, buccal mucosa, and tongue normal  LUNGS: clear to auscultation and percussion with normal breathing effort HEART: irregular ABDOMEN:abdomen soft, non-tender and normal bowel sounds Musculoskeletal:no cyanosis of digits and no clubbing  NEURO: dementia, moves all extremities, muscle wasting UE  LABORATORY DATA:  I have reviewed the data as listed CMP Latest Ref Rng & Units 03/20/2018 03/19/2018 03/18/2018  Glucose 65 - 99 mg/dL 277(H) 214(H) 148(H)  BUN 6 - 20 mg/dL 39(H) 29(H) 22(H)  Creatinine 0.44 - 1.00 mg/dL 0.80 0.74 0.83  Sodium 135 - 145 mmol/L 132(L) 134(L) 140  Potassium 3.5 - 5.1 mmol/L 4.1  4.5 4.3  Chloride 101 - 111 mmol/L 96(L) 96(L) 98(L)  CO2 22 - 32 mmol/L 30 29 32  Calcium 8.9 - 10.3 mg/dL 8.7(L) 9.2 9.6  Total Protein 6.5 - 8.1 g/dL - - 6.7  Total Bilirubin 0.3 - 1.2 mg/dL - - 0.7  Alkaline Phos 38 - 126 U/L - - 63  AST 15 - 41 U/L - - 14(L)  ALT 14 - 54 U/L - - 10(L)    Lab Results  Component Value Date   WBC 10.5 03/20/2018   HGB 9.4 (L) 03/20/2018   HCT 28.9 (L) 03/20/2018   MCV 79.4 03/20/2018   PLT 56 (L) 03/20/2018   NEUTROABS 9.7 (H) 03/20/2018    ASSESSMENT AND PLAN: 1. ITP: received IVIG and gets decadron. I will start to taper Dexamethasone. Hope to see full normalization of platelet counts in a few days Due to viral illness Once bleeding has stopped, she may be able to restart anti-coagulation if its necessary. Patients son at bed side 2. Multiple chronic medical illnesses

## 2018-03-20 NOTE — Progress Notes (Signed)
PROGRESS NOTE    Sharon ELSEY  XTG:[id number] DOB: 11-21-1941 DOA: 03/18/2018 PCP: [name], DO    Brief Narrative:  [name] is a 77 y.o. female with history of diabetes mellitus type 2, COPD, OSA, chronic diastolic heart failure, persistent atrial fibrillation on Eliquis, dementia, and diverticulosis  who presented from skilled nursing with rectal bleeding. On admission Labs showed White blood cell count 11.8, hemoglobin 12.8, platelets less than 5.  INR 1.67 (on Eliquis).  Rectal exam demonstrated obvious stool mixed with red and dark red blood. She was admitted to stepdown for further evaluation. Hematology consulted and recommendations given.         Assessment & Plan:   Principal Problem:   Thrombocytopenia ([place]) Active Problems:   Obstructive sleep apnea   COPD (chronic obstructive pulmonary disease) (HCC)   Chronic respiratory failure with hypoxia (HCC)   Chronic atrial fibrillation (HCC)   Chronic anticoagulation, on Eliquis   Chronic diastolic CHF (congestive heart failure) ([place])   Diabetes mellitus type 2 in obese (HCC)   Dementia without behavioral disturbance   Diverticular hemorrhage     Rectal bleed Possibly sec to severe thrombocytopenia, with underlying diverticulosis and being on eliquis.  She underwent 2 units of platelet transfusion and repeat cbc shows hemoglobin of 10.9 and platelets of 14,000. Repeat cbc this am still pending.  Fibrinogen is 553, b12 leve is 568.  D dimer  And anti platelet antibodies pending.  Hold all anti coagulants, anti platelet agents.  Rectal bleeding has improved.    Severe thrombocytopenia with platelet count less than 5000: S/p 2 units of platelet transfusion, platelets are 14,000.  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  Hematology consulted and recommendations given to start her on IVIG for 2 days and IV steroids for 4 days. Hopefully she responds in the next 48 hours, if she does not respond, will  probably need bone marrow biopsy. Discussed the above with the patient's son at bedside and answered all his questions.  No new questions at this time.     Acute blood loss anemia:  Baseline hemoglobin is around 12, stable around 10.  Transfuse to keep hemoglobin greater than 7.    Dementia without behavioral disturbance: No signs of agitation.   Chronic atrial fibrillation :  Rate controlled. On eliquis prior to admission, which is being held for severe thrombocytopenia.    COPD: No wheezing heard.    Chronic respiratory failure: Stable.   Type 2 Diabetes mellitus:  CBG (last 3)  Recent Labs    03/19/18 0835 03/20/18 0717  GLUCAP 191* 200*   Resume SSI.  Pt started eating. No changes in meds at this time.       DVT prophylaxis: none, severe thrombocytopenia.  Code Status:  Full code.  Family Communication: discussed with son at bedside   Disposition Plan: clinical improvement and improvement in platelet count.    Consultants:   Hematology.    Procedures:none.    Antimicrobials: none.    Subjective: Denies any complaints, is eating breakfast. Objective: Vitals:   03/20/18 0600 03/20/18 0749 03/20/18 0930 03/20/18 0931  BP: 132/67     Pulse: (!) 44     Resp: 18     Temp:  (!) 97.4 F (36.3 C)    TempSrc:  Oral    SpO2: 100%  100% 99%  Weight:      Height:        Intake/Output Summary (Last 24 hours) at 03/20/2018 1106 Last  data filed at 03/20/2018 0500 Gross per 24 hour  Intake 1238.28 ml  Output 1000 ml  Net 238.28 ml   Filed Weights   03/18/18 2026 03/19/18 0841 03/20/18 0500  Weight: 88.5 kg (195 lb) 90.7 kg (200 lb) 89.4 kg (197 lb 1.5 oz)    Examination:  General exam: in good spirits , comfortable.  Respiratory system: air entry fair today, no wheezing heard.   Respiratory effort normal. Cardiovascular system: S1 & S2 heard, RRR. No JVD, murmurs, .  Gastrointestinal system: Abdomen is soft NT ND BS+ Central nervous system:  somnolent, woke up briefly Extremities: trace edema, no cyanosis or clubbing.  Skin: bruising over the left arm.  Psychiatry: mood and affect normal.    Data Reviewed: I have personally reviewed following labs and imaging studies  CBC: Recent Labs  Lab 03/18/18 1410 03/18/18 1728 03/19/18 0816 03/19/18 1246  WBC 11.8* 13.7* 7.6 6.2  NEUTROABS 9.2*  --  6.8 5.7  HGB 12.8 12.6 10.9* 10.9*  HCT 40.5 39.9 34.4* 33.0*  MCV 81.2 82.1 81.1 79.7  PLT <5* <5* <5* 14*   Basic Metabolic Panel: Recent Labs  Lab 03/18/18 1410 03/19/18 0816  NA 140 134*  K 4.3 4.5  CL 98* 96*  CO2 32 29  GLUCOSE 148* 214*  BUN 22* 29*  CREATININE 0.83 0.74  CALCIUM 9.6 9.2   GFR: Estimated Creatinine Clearance: 58.9 mL/min (by C-G formula based on SCr of 0.74 mg/dL). Liver Function Tests: Recent Labs  Lab 03/18/18 1410  AST 14*  ALT 10*  ALKPHOS 63  BILITOT 0.7  PROT 6.7  ALBUMIN 3.2*   No results for input(s): LIPASE, AMYLASE in the last 168 hours. No results for input(s): AMMONIA in the last 168 hours. Coagulation Profile: Recent Labs  Lab 03/18/18 1410  INR 1.67   Cardiac Enzymes: No results for input(s): CKTOTAL, CKMB, CKMBINDEX, TROPONINI in the last 168 hours. BNP (last 3 results) No results for input(s): PROBNP in the last 8760 hours. HbA1C: No results for input(s): HGBA1C in the last 72 hours. CBG: Recent Labs  Lab 03/19/18 0835 03/20/18 0717  GLUCAP 191* 200*   Lipid Profile: No results for input(s): CHOL, HDL, LDLCALC, TRIG, CHOLHDL, LDLDIRECT in the last 72 hours. Thyroid Function Tests: No results for input(s): TSH, T4TOTAL, FREET4, T3FREE, THYROIDAB in the last 72 hours. Anemia Panel: Recent Labs    03/19/18 0822  VITAMINB12 568   Sepsis Labs: No results for input(s): PROCALCITON, LATICACIDVEN in the last 168 hours.  Recent Results (from the past 240 hour(s))  MRSA PCR Screening     Status: Abnormal   Collection Time: 03/19/18 12:36 PM  Result  Value Ref Range Status   MRSA by PCR POSITIVE (A) NEGATIVE Final    Comment:        The GeneXpert MRSA Assay (FDA approved for NASAL specimens only), is one component of a comprehensive MRSA colonization surveillance program. It is not intended to diagnose MRSA infection nor to guide or monitor treatment for MRSA infections. RESULT CALLED TO, READ BACK BY AND VERIFIED WITH: D.CARPENTER AT 1549 ON 03/19/18 BY N.THOMPSON Performed at [facility], [place] [address], [place], [place] [address]          Radiology Studies: Dg Chest Port 1 View  Result Date: 03/18/2018 CLINICAL DATA:  Shortness of breath today EXAM: PORTABLE CHEST 1 VIEW COMPARISON:  October 05, 2015 FINDINGS: The heart size and mediastinal contours are within normal limits. Mild chronic increased  pulmonary interstitium is identified bilaterally unchanged. There is no focal pneumonia or pleural effusion. The visualized skeletal structures are stable. IMPRESSION: No active cardiopulmonary disease. Electronically Signed   By: Abelardo Diesel M.D.   On: 03/18/2018 17:27        Scheduled Meds: . bacitracin   Topical BID  . Chlorhexidine Gluconate Cloth  6 each Topical Q0600  . dexamethasone  20 mg Intravenous Q24H  . donepezil  10 mg Oral QHS  . furosemide  20 mg Oral Daily  . liver oil-zinc oxide   Topical TID  . metoCLOPramide  5 mg Oral BID  . mometasone-formoterol  2 puff Inhalation BID  . mupirocin ointment  1 application Nasal BID  . polyvinyl alcohol  1 drop Both Eyes TID  . venlafaxine XR  37.5 mg Oral Q breakfast   Continuous Infusions:    LOS: 2 days    Time spent: 35 minutes.     Hosie Poisson, MD [facility] Pager [phone]  If 7PM-7AM, please contact night-coverage [email or web address] Password [email or web address] 03/20/2018, 11:06 AM

## 2018-03-20 NOTE — Progress Notes (Signed)
Inpatient Diabetes Program Recommendations  AACE/ADA: New Consensus Statement on Inpatient Glycemic Control (2015)  Target Ranges:  Prepandial:   less than 140 mg/dL      Peak postprandial:   less than 180 mg/dL (1-2 hours)      Critically ill patients:  140 - 180 mg/dL   Lab Results  Component Value Date   GLUCAP 200 (H) 03/20/2018   HGBA1C 5.7 02/21/2017  Results for [name], [name] (MRN [id number]) as of 03/20/2018 13:32  Ref. Range 03/19/2018 08:16 03/20/2018 11:25  Glucose Latest Ref Range: 65 - 99 mg/dL 214 (H) 277 (H)   Inpatient Diabetes Program Recommendations:    Note elevated lab glucose and no history of Diabetes.  Patient is on IV steroids.  If appropriate, consider checking blood sugars and adding Novolog correction while on steroids.   Thanks,  Adah Perl, RN, BC-ADM Inpatient Diabetes Coordinator Pager [phone]

## 2018-03-21 LAB — PLATELET ANTIBODY PROFILE
GLYCOPROTEIN IV ANTIBODY: NEGATIVE
HLA Ab Ser Ql EIA: NEGATIVE
IA/IIA ANTIBODY: NEGATIVE
IB/IX ANTIBODY: NEGATIVE
IIB/IIIA ANTIBODY: NEGATIVE

## 2018-03-21 LAB — CBC WITH DIFFERENTIAL/PLATELET
BASOS ABS: 0 10*3/uL (ref 0.0–0.1)
BASOS PCT: 0 %
EOS ABS: 0 10*3/uL (ref 0.0–0.7)
Eosinophils Relative: 0 %
HCT: 29.4 % — ABNORMAL LOW (ref 36.0–46.0)
HEMOGLOBIN: 9.5 g/dL — AB (ref 12.0–15.0)
Lymphocytes Relative: 6 %
Lymphs Abs: 0.6 10*3/uL — ABNORMAL LOW (ref 0.7–4.0)
MCH: 25.7 pg — ABNORMAL LOW (ref 26.0–34.0)
MCHC: 32.3 g/dL (ref 30.0–36.0)
MCV: 79.7 fL (ref 78.0–100.0)
Monocytes Absolute: 0.2 10*3/uL (ref 0.1–1.0)
Monocytes Relative: 2 %
NEUTROS ABS: 9.7 10*3/uL — AB (ref 1.7–7.7)
NEUTROS PCT: 92 %
Platelets: 105 10*3/uL — ABNORMAL LOW (ref 150–400)
RBC: 3.69 MIL/uL — AB (ref 3.87–5.11)
RDW: 14.5 % (ref 11.5–15.5)
WBC: 10.5 10*3/uL (ref 4.0–10.5)

## 2018-03-21 LAB — BASIC METABOLIC PANEL
ANION GAP: 9 (ref 5–15)
BUN: 53 mg/dL — ABNORMAL HIGH (ref 6–20)
CHLORIDE: 96 mmol/L — AB (ref 101–111)
CO2: 29 mmol/L (ref 22–32)
CREATININE: 1.12 mg/dL — AB (ref 0.44–1.00)
Calcium: 8.8 mg/dL — ABNORMAL LOW (ref 8.9–10.3)
GFR calc non Af Amer: 46 mL/min — ABNORMAL LOW (ref >60)
GFR, EST AFRICAN AMERICAN: 53 mL/min — AB (ref >60)
Glucose, Bld: 227 mg/dL — ABNORMAL HIGH (ref 65–99)
POTASSIUM: 4.5 mmol/L (ref 3.5–5.1)
SODIUM: 134 mmol/L — AB (ref 135–145)

## 2018-03-21 LAB — GLUCOSE, CAPILLARY
GLUCOSE-CAPILLARY: 230 mg/dL — AB (ref 65–99)
GLUCOSE-CAPILLARY: 279 mg/dL — AB (ref 65–99)
GLUCOSE-CAPILLARY: 194 mg/dL — AB (ref 65–99)
Glucose-Capillary: 207 mg/dL — ABNORMAL HIGH (ref 65–99)

## 2018-03-21 MED ORDER — INSULIN ASPART 100 UNIT/ML ~~LOC~~ SOLN
0.0000 [IU] | Freq: Three times a day (TID) | SUBCUTANEOUS | Status: DC
Start: 2018-03-21 — End: 2018-03-23
  Administered 2018-03-21: 5 [IU] via SUBCUTANEOUS
  Administered 2018-03-21: 2 [IU] via SUBCUTANEOUS
  Administered 2018-03-22: 5 [IU] via SUBCUTANEOUS
  Administered 2018-03-22 (×2): 3 [IU] via SUBCUTANEOUS

## 2018-03-21 MED ORDER — SODIUM CHLORIDE 0.9 % IV SOLN
INTRAVENOUS | Status: DC
Start: 2018-03-21 — End: 2018-03-22
  Administered 2018-03-21 – 2018-03-22 (×3): via INTRAVENOUS

## 2018-03-21 MED ORDER — INSULIN ASPART 100 UNIT/ML ~~LOC~~ SOLN
0.0000 [IU] | Freq: Every day | SUBCUTANEOUS | Status: DC
  Administered 2018-03-21: 2 [IU] via SUBCUTANEOUS

## 2018-03-21 NOTE — Progress Notes (Signed)
PROGRESS NOTE    [name]  VVO:[id number] DOB: 1941-06-05 DOA: 03/18/2018 PCP: [name], DO    Brief Narrative:  [name] is a 77 y.o. female with history of diabetes mellitus type 2, COPD, OSA, chronic diastolic heart failure, persistent atrial fibrillation on Eliquis, dementia, and diverticulosis  who presented from skilled nursing with rectal bleeding. On admission Labs showed White blood cell count 11.8, hemoglobin 12.8, platelets less than 5.  INR 1.67 (on Eliquis).  Rectal exam demonstrated obvious stool mixed with red and dark red blood. She was admitted to stepdown for further evaluation. Hematology consulted and recommendations given.         Assessment & Plan:   Principal Problem:   Thrombocytopenia ([place]) Active Problems:   Obstructive sleep apnea   COPD (chronic obstructive pulmonary disease) (HCC)   Chronic respiratory failure with hypoxia (HCC)   Chronic atrial fibrillation (HCC)   Chronic anticoagulation, on Eliquis   Chronic diastolic CHF (congestive heart failure) (HCC)   Diabetes mellitus type 2 in obese (HCC)   Dementia without behavioral disturbance   Diverticular hemorrhage   Acute ITP ([place])   Palliative care by specialist   Goals of care, counseling/discussion     Rectal bleed Possibly sec to severe thrombocytopenia, with underlying diverticulosis and being on eliquis.  S/p 2 units of platelet transfusion on admission.  Hemoglobin stabilized around 9. Platelets have improved to 105000. No signs of bleeding.  Fibrinogen is 553, b12 leve is 568.  D dimer  And anti platelet antibodies pending.  Hold all anti coagulants, anti platelet agents.     Severe thrombocytopenia with platelet count less than 5000: S/p 2 units of platelet transfusion, platelets are 105000  Differential include ITP vs marrow failure.  Hematology consulted and recommendations given to start her on IVIG for 2 days and IV steroids for 4 days. She has responded to  IVIG.  Start tapering decadron.     Acute blood loss anemia:  Baseline hemoglobin is around 12, stable between 9 to 10.  Transfuse to keep hemoglobin greater than 7.  Vit b12 wnl .   Dementia without behavioral disturbance: No signs of agitation.   Chronic atrial fibrillation :  Rate controlled. On eliquis prior to admission, which is being held for severe thrombocytopenia.    COPD: No wheezing heard.    Chronic respiratory failure: Stable.   Type 2 Diabetes mellitus:  CBG (last 3)  Recent Labs    03/19/18 0835 03/20/18 0717 03/21/18 0737  GLUCAP 191* 200* 230*   Resume SSI.  Pt started eating. No changes in meds at this time.    Acute kidney injury:  Suspect from dehydration, acute blood loss . Hydrate her gently and recheck renal parameters in am. Get UA.      DVT prophylaxis: none, severe thrombocytopenia.  Code Status:  Full code.  Family Communication: none at bedside   Disposition Plan: tranfer to the floor.    Consultants:   Hematology.    Procedures:none.    Antimicrobials: none.    Subjective: Pleasantly confused, able to answer simple questions.   Objective: Vitals:   03/21/18 0600 03/21/18 0700 03/21/18 0800 03/21/18 0825  BP: 137/66  139/65   Pulse: 70  65 76  Resp: (!) 23  20 14   Temp:  97.9 F (36.6 C)    TempSrc:  Oral    SpO2: 97%  98% 93%  Weight:      Height:  Intake/Output Summary (Last 24 hours) at 03/21/2018 0928 Last data filed at 03/21/2018 0810 Gross per 24 hour  Intake 1200 ml  Output 1250 ml  Net -50 ml   Filed Weights   03/19/18 0841 03/20/18 0500 03/21/18 0319  Weight: 90.7 kg (200 lb) 89.4 kg (197 lb 1.5 oz) 89.4 kg (197 lb 1.5 oz)    Examination:  General exam: calm and comfortable, in mittens.  Respiratory system: no wheezing or rhonchi. Air entry fair.  Cardiovascular system: S1 & S2 heard, RRR. No JVD, murmurs, .  Gastrointestinal system: Abdomen is soft non tender non distended bowel  sounds heard.  Central nervous system: alert and comfortable. Able to answer simple questions.  Extremities: trace edema, no cyanosis   Skin: bruising over the left arm.  Psychiatry: mood and affect normal.    Data Reviewed: I have personally reviewed following labs and imaging studies  CBC: Recent Labs  Lab 03/18/18 1410 03/18/18 1728 03/19/18 0816 03/19/18 0822 03/19/18 1246 03/20/18 1125 03/21/18 0338  WBC 11.8* 13.7* 7.6  --  6.2 10.5 10.5  NEUTROABS 9.2*  --  6.8  --  5.7 9.7* 9.7*  HGB 12.8 12.6 10.9*  --  10.9* 9.4* 9.5*  HCT 40.5 39.9 34.4* 32.6* 33.0* 28.9* 29.4*  MCV 81.2 82.1 81.1  --  79.7 79.4 79.7  PLT <5* <5* <5*  --  14* 56* 151*   Basic Metabolic Panel: Recent Labs  Lab 03/18/18 1410 03/19/18 0816 03/20/18 1125 03/21/18 0338  NA 140 134* 132* 134*  K 4.3 4.5 4.1 4.5  CL 98* 96* 96* 96*  CO2 32 29 30 29   GLUCOSE 148* 214* 277* 227*  BUN 22* 29* 39* 53*  CREATININE 0.83 0.74 0.80 1.12*  CALCIUM 9.6 9.2 8.7* 8.8*   GFR: Estimated Creatinine Clearance: 42.1 mL/min (A) (by C-G formula based on SCr of 1.12 mg/dL (H)). Liver Function Tests: Recent Labs  Lab 03/18/18 1410  AST 14*  ALT 10*  ALKPHOS 63  BILITOT 0.7  PROT 6.7  ALBUMIN 3.2*   No results for input(s): LIPASE, AMYLASE in the last 168 hours. No results for input(s): AMMONIA in the last 168 hours. Coagulation Profile: Recent Labs  Lab 03/18/18 1410  INR 1.67   Cardiac Enzymes: No results for input(s): CKTOTAL, CKMB, CKMBINDEX, TROPONINI in the last 168 hours. BNP (last 3 results) No results for input(s): PROBNP in the last 8760 hours. HbA1C: No results for input(s): HGBA1C in the last 72 hours. CBG: Recent Labs  Lab 03/19/18 0835 03/20/18 0717 03/21/18 0737  GLUCAP 191* 200* 230*   Lipid Profile: No results for input(s): CHOL, HDL, LDLCALC, TRIG, CHOLHDL, LDLDIRECT in the last 72 hours. Thyroid Function Tests: No results for input(s): TSH, T4TOTAL, FREET4, T3FREE,  THYROIDAB in the last 72 hours. Anemia Panel: Recent Labs    03/19/18 0822  VITAMINB12 568   Sepsis Labs: No results for input(s): PROCALCITON, LATICACIDVEN in the last 168 hours.  Recent Results (from the past 240 hour(s))  MRSA PCR Screening     Status: Abnormal   Collection Time: 03/19/18 12:36 PM  Result Value Ref Range Status   MRSA by PCR POSITIVE (A) NEGATIVE Final    Comment:        The GeneXpert MRSA Assay (FDA approved for NASAL specimens only), is one component of a comprehensive MRSA colonization surveillance program. It is not intended to diagnose MRSA infection nor to guide or monitor treatment for MRSA infections. RESULT CALLED TO, READ BACK  BY AND VERIFIED WITH: D.CARPENTER AT 3094 ON 03/19/18 BY N.THOMPSON Performed at [facility], [place] [address], [place], [place] [address]          Radiology Studies: No results found.      Scheduled Meds: . bacitracin   Topical BID  . Chlorhexidine Gluconate Cloth  6 each Topical Q0600  . dexamethasone  10 mg Intravenous Q24H  . donepezil  10 mg Oral QHS  . furosemide  20 mg Oral Daily  . insulin aspart  0-5 Units Subcutaneous QHS  . insulin aspart  0-9 Units Subcutaneous TID WC  . liver oil-zinc oxide   Topical TID  . metoCLOPramide  5 mg Oral BID  . mometasone-formoterol  2 puff Inhalation BID  . mupirocin ointment  1 application Nasal BID  . polyvinyl alcohol  1 drop Both Eyes TID  . venlafaxine XR  37.5 mg Oral Q breakfast   Continuous Infusions: . sodium chloride 75 mL/hr at 03/21/18 0810     LOS: 3 days    Time spent: 35 minutes.     Hosie Poisson, MD [facility] Pager [phone]  If 7PM-7AM, please contact night-coverage [email or web address] Password [facility] 03/21/2018, 9:28 AM

## 2018-03-21 NOTE — Plan of Care (Signed)
  Problem: Nutrition: Goal: Adequate nutrition will be maintained Outcome: Progressing   Problem: Elimination: Goal: Will not experience complications related to bowel motility Outcome: Progressing   Problem: Skin Integrity: Goal: Risk for impaired skin integrity will decrease Outcome: Progressing   

## 2018-03-21 NOTE — Progress Notes (Signed)
Pt declined cpap stating she does not use it at home.  Pt was encouraged to call should she change her mind.

## 2018-03-21 NOTE — Progress Notes (Signed)
Daily Progress Note    Patient Name: [name]       Date: 03/21/2018 DOB: [birth year]  Age: 77 y.o. MRN#: [id number] Attending Physician: Hosie Poisson, MD Primary Care Physician: [name], DO Admit Date: 03/18/2018  Reason for Consultation/Follow-up: Establishing goals of care  Subjective: Patient awake, alert, oriented to person and placed. Denies pain or discomfort. Ready to eat lunch, which was delivered during my visit.   GOC:  Son, [name], at bedside. Reviewed labs and vitals. Answered questions regarding plan of care. He is hopeful that she will continue to show improvement in platelets as well as cognitive/nutritional status. He tells me she is back to baseline today--asking normal repetitive questions to him and whispering to herself (as she does at the nursing facility).   Tim has PMT contact information. He is appreciative of support from palliative and agreeable with continued support from outpatient palliative services.   Length of Stay: 3  Current Medications: Scheduled Meds:  . bacitracin   Topical BID  . Chlorhexidine Gluconate Cloth  6 each Topical Q0600  . dexamethasone  10 mg Intravenous Q24H  . donepezil  10 mg Oral QHS  . insulin aspart  0-5 Units Subcutaneous QHS  . insulin aspart  0-9 Units Subcutaneous TID WC  . liver oil-zinc oxide   Topical TID  . metoCLOPramide  5 mg Oral BID  . mometasone-formoterol  2 puff Inhalation BID  . mupirocin ointment  1 application Nasal BID  . polyvinyl alcohol  1 drop Both Eyes TID  . venlafaxine XR  37.5 mg Oral Q breakfast    Continuous Infusions: . sodium chloride 75 mL/hr at 03/21/18 0810    PRN Meds: acetaminophen **OR** acetaminophen, albuterol, bisacodyl, LORazepam, magnesium citrate, ondansetron **OR**  ondansetron (ZOFRAN) IV, oxyCODONE, polyethylene glycol  Physical Exam  Constitutional: She is cooperative.  HENT:  Head: Normocephalic and atraumatic.  Cardiovascular: Regular rhythm.  Pulmonary/Chest: No accessory muscle usage. No tachypnea. No respiratory distress.  Neurological: She is alert.  Pleasantly confused  Skin: Skin is warm and dry. Ecchymosis noted. There is pallor.  Psychiatric: Her speech is delayed. Cognition and memory are impaired. She is inattentive.  Nursing note and vitals reviewed.          Vital Signs: BP 139/65   Pulse 76   Temp 97.9  F (36.6 C) (Oral)   Resp 14   Ht 5' 1" (1.549 m)   Wt 89.4 kg (197 lb 1.5 oz)   SpO2 95%   BMI 37.24 kg/m  SpO2: SpO2: 95 % O2 Device: O2 Device: Nasal Cannula O2 Flow Rate: O2 Flow Rate (L/min): 2 L/min  Intake/output summary:   Intake/Output Summary (Last 24 hours) at 03/21/2018 1137 Last data filed at 03/21/2018 1042 Gross per 24 hour  Intake 1200 ml  Output 1650 ml  Net -450 ml   LBM: Last BM Date: 03/19/18 Baseline Weight: Weight: 88.5 kg (195 lb) Most recent weight: Weight: 89.4 kg (197 lb 1.5 oz)       Palliative Assessment/Data: PPS 40%   Flowsheet Rows     Most Recent Value  Intake Tab  Referral Department  Hospitalist  Unit at Time of Referral  ICU  Palliative Care Primary Diagnosis  -- Lewanda Rife, severe thrombocytopenia]  Date Notified  03/18/18  Palliative Care Type  New Palliative care  Reason for referral  Clarify Goals of Care  Date of Admission  03/18/18  Date first seen by Palliative Care  03/20/18  # of days IP prior to Palliative referral  0  Clinical Assessment  Palliative Performance Scale Score  40%  Psychosocial & Spiritual Assessment  Palliative Care Outcomes  Patient/Family meeting held?  Yes  Who was at the meeting?  son [name])  [place] goals of care, Provided psychosocial or spiritual support, Counseled regarding hospice, ACP counseling assistance       Patient Active Problem List   Diagnosis Date Noted  . Acute ITP ([place])   . Palliative care by specialist   . Goals of care, counseling/discussion   . Diverticular hemorrhage 03/18/2018  . Thrombocytopenia ([place]) 03/18/2018  . 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 CKD (chronic kidney disease) stage 2, GFR 60-89 ml/min 07/12/2016  . Hypertensive heart disease with heart failure ([place]) 05/17/2016  . Diabetes mellitus type 2 in obese ([place]) 05/17/2016  . Dementia without behavioral disturbance 05/17/2016  . Hypotension 10/05/2015  . Osteoporosis 10/02/2014  . Vertigo 04/19/2014  . Pre-syncope 04/17/2014  . Pulmonary hypertension- PA pressure 46 mmHg 01/29/2014  . Hypokalemia 01/27/2014  . Acute on chronic diastolic heart failure ([place]) 01/24/2014  . Hypertension 11/27/2013  . Constipation 11/27/2013  . Chronic diastolic CHF (congestive heart failure) ([place]) 11/18/2013  . Bradycardia 11/01/2013  . Chronic atrial fibrillation ([place]) 11/01/2013  . Chronic anticoagulation, on Eliquis 11/01/2013  . COPD (chronic obstructive pulmonary disease) ([place]) 08/19/2013  . Chronic respiratory failure with hypoxia ([place]) 08/19/2013  . Weakness generalized 08/19/2013  . Lung nodule seen on imaging study 08/18/2013  . Nausea and vomiting 08/18/2013  . Lower GI bleed 05/18/2013  . Aspiration pneumonia ([place]) 05/12/2013  . Hyperlipidemia 05/03/2013  . COPD exacerbation ([place]) 11/20/2012  . Gout 11/17/2012  . Hypocalcemia 11/17/2012  . Upper respiratory infection 12/17/2011  . Breast cancer of lower-inner quadrant of right female breast ([place]) 10/17/2011  . Obesity hypoventilation syndrome ([place]) 09/24/2011  . Peripheral edema 09/24/2011  . Anemia 06/01/2011  . 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  . Bronchitis 05/27/2008  . Obstructive sleep apnea 05/27/2008    Palliative Care Assessment & Plan   Patient Profile: 77 y.o. female  with past medical history of dementia, afib on eliquis,  breast and uterine cancer, DM, OSA on CPAP, obesity, HTN, diastolic CHF, COPD, sarcoidosis, anemia, asthma admitted on 03/18/2018 with rectal bleeding from SNF. On admission, labs revealed  WBC 11.8, hemoglobin 12.8, platelets less than 5. INR 1.67 on eliquis. Rectal exam revealed stool with red/dark red blood. 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 Receiving IVIG x2 days and steroids. S/p 2 units of platelet transfusion. If patient does not respond to IVIG and steroids, may need CT r/o malignancy and/or bone marrow biopsy. Out of facility DNR but sons have requested FULL code this hospitalization. Palliative medicine consultation for goals of care.   Assessment: Thrombocytopenia Acute ITP Dementia Chronic afib Chronic diastolic CHF Diverticulitis  Recommendations/Plan:  FULL code/FULL scope  Continue watchful waiting. Platelets improving.   May benefit from continued support from outpatient palliative at SNF with underlying dementia.   Goals of Care and Additional Recommendations:  Limitations on Scope of Treatment: Full Scope Treatment  Code Status: FULL   Code Status Orders  (From admission, onward)        Start     Ordered   03/18/18 1738  Full code  Continuous     03/18/18 1739    Code Status History    Date Active Date Inactive Code Status Order ID Comments User Context   10/05/2015 2108 10/09/2015 2303 Full Code [id number]  Lavina Hamman, MD ED   04/17/2014 2008 04/23/2014 1707 Full Code [id number]  Elgergawy, Silver Huguenin, MD ED   01/24/2014 1818 01/30/2014 1618 Full Code [id number]  Brett Canales, PA-C Inpatient   11/01/2013 0103 11/07/2013 1849 Full Code [id number]  Louellen Molder, MD Inpatient   08/19/2013 0152 08/26/2013 2031 Full Code [id number]  Phillips Grout, MD Inpatient   05/03/2013 1751 05/14/2013 1910 Full Code [id number]  Rama, Venetia Maxon, MD Inpatient    Advance Directive Documentation     Most Recent Value  Type of Advance Directive  -- [son tim hcpoa]    Pre-existing out of facility DNR order (yellow form or pink MOST form)  -  "MOST" Form in Place?  -       Prognosis:   Unable to determine  Discharge Planning:  To Be Determined  Care plan was discussed with patient and son  Thank you for allowing the Palliative Medicine Team to assist in the care of this patient.   Time In: 1110 Time Out: 1130 Total Time 16mn Prolonged Time Billed  no      Greater than 50%  of this time was spent counseling and coordinating care related to the above assessment and plan.  MIhor Dow FNP-C Palliative Medicine Team  Phone: [phone]: [phone] Please contact Palliative Medicine Team phone at [phone] questions and concerns.

## 2018-03-22 DIAGNOSIS — Z7189 Other specified counseling: Secondary | ICD-10-CM

## 2018-03-22 DIAGNOSIS — Z515 Encounter for palliative care: Secondary | ICD-10-CM

## 2018-03-22 LAB — CBC WITH DIFFERENTIAL/PLATELET
BASOS PCT: 0 %
Basophils Absolute: 0 10*3/uL (ref 0.0–0.1)
EOS ABS: 0 10*3/uL (ref 0.0–0.7)
Eosinophils Relative: 0 %
HCT: 30.2 % — ABNORMAL LOW (ref 36.0–46.0)
HEMOGLOBIN: 9.7 g/dL — AB (ref 12.0–15.0)
Lymphocytes Relative: 5 %
Lymphs Abs: 0.4 10*3/uL — ABNORMAL LOW (ref 0.7–4.0)
MCH: 26.1 pg (ref 26.0–34.0)
MCHC: 32.1 g/dL (ref 30.0–36.0)
MCV: 81.2 fL (ref 78.0–100.0)
MONO ABS: 0.2 10*3/uL (ref 0.1–1.0)
MONOS PCT: 3 %
Neutro Abs: 6.6 10*3/uL (ref 1.7–7.7)
Neutrophils Relative %: 92 %
Platelets: 155 10*3/uL (ref 150–400)
RBC: 3.72 MIL/uL — ABNORMAL LOW (ref 3.87–5.11)
RDW: 14.8 % (ref 11.5–15.5)
WBC: 7.2 10*3/uL (ref 4.0–10.5)

## 2018-03-22 LAB — BASIC METABOLIC PANEL
Anion gap: 8 (ref 5–15)
BUN: 41 mg/dL — ABNORMAL HIGH (ref 6–20)
CALCIUM: 8.5 mg/dL — AB (ref 8.9–10.3)
CO2: 28 mmol/L (ref 22–32)
CREATININE: 0.77 mg/dL (ref 0.44–1.00)
Chloride: 100 mmol/L — ABNORMAL LOW (ref 101–111)
GFR calc non Af Amer: >60 mL/min (ref >60)
Glucose, Bld: 244 mg/dL — ABNORMAL HIGH (ref 65–99)
Potassium: 4.2 mmol/L (ref 3.5–5.1)
SODIUM: 136 mmol/L (ref 135–145)

## 2018-03-22 LAB — GLUCOSE, CAPILLARY
GLUCOSE-CAPILLARY: 217 mg/dL — AB (ref 65–99)
GLUCOSE-CAPILLARY: 260 mg/dL — AB (ref 65–99)
Glucose-Capillary: 216 mg/dL — ABNORMAL HIGH (ref 65–99)

## 2018-03-22 MED ORDER — APIXABAN 5 MG PO TABS
5.0000 mg | ORAL_TABLET | Freq: Two times a day (BID) | ORAL | Status: DC
  Administered 2018-03-22 – 2018-03-23 (×2): 5 mg via ORAL
  Filled 2018-03-22 (×3): qty 1

## 2018-03-22 NOTE — Care Management Important Message (Signed)
Important Message  Patient Details  Name: SANDIA PFUND MRN: [id number] Date of Birth: June 17, 1941   Medicare Important Message Given:  Yes    Kerin Salen 03/22/2018, 10:13 AMImportant Message  Patient Details  Name: TASHEENA WAMBOLT MRN: [id number] Date of Birth: [birth year]   Medicare Important Message Given:  Yes    Kerin Salen 03/22/2018, 10:13 AM

## 2018-03-22 NOTE — Progress Notes (Signed)
Daily Progress Note    Patient Name: [name]       Date: 03/22/2018 DOB: 1941/05/22  Age: 77 y.o. MRN#: [id number] Attending Physician: Hosie Poisson, MD Primary Care Physician: [name], DO Admit Date: 03/18/2018  Reason for Consultation/Follow-up: Establishing goals of care  Subjective: Patient wakes to voice. Denies pain or discomfort. Resting comfortably during my visit.   GOC:  Son, [name], at bedside. Reviewed labs and vitals. Answered questions regarding plan of care. He is reassured that her platelets are now up to 155. He tells me she is back to baseline but experiencing some delirium last night and this morning. Tim tells me she may be discharged today.   We again discussed disease trajectory of dementia. I encouraged him to continue conversations with his brother regarding goals of care and what his mother's wishes would be regarding heroic interventions. Tim tells me he has worked in [employer] with many elderly individuals and has a good understanding of expectations with advancing dementia. He tells me he prays for his family (especially brother) who has a hard time accepting she will not live forever.   Discussed outpatient palliative services to follow at SNF. Tim knows he can contact PCP for outpatient palliative referral.   Emotional/spiritual support provided. Therapeutic listening as [name] shares stories of his mother raising two "ornery" boys.    Length of Stay: 4  Current Medications: Scheduled Meds:  . bacitracin   Topical BID  . Chlorhexidine Gluconate Cloth  6 each Topical Q0600  . donepezil  10 mg Oral QHS  . insulin aspart  0-5 Units Subcutaneous QHS  . insulin aspart  0-9 Units Subcutaneous TID WC  . liver oil-zinc oxide   Topical TID  .  metoCLOPramide  5 mg Oral BID  . mometasone-formoterol  2 puff Inhalation BID  . mupirocin ointment  1 application Nasal BID  . polyvinyl alcohol  1 drop Both Eyes TID  . venlafaxine XR  37.5 mg Oral Q breakfast    Continuous Infusions: . sodium chloride 75 mL/hr at 03/22/18 0754    PRN Meds: acetaminophen **OR** acetaminophen, albuterol, bisacodyl, LORazepam, magnesium citrate, ondansetron **OR** ondansetron (ZOFRAN) IV, oxyCODONE, polyethylene glycol  Physical Exam  Constitutional: She is cooperative.  HENT:  Head: Normocephalic and atraumatic.  Cardiovascular: Regular rhythm.  Pulmonary/Chest: No  accessory muscle usage. No tachypnea. No respiratory distress.  Neurological: She is alert.  Pleasantly confused  Skin: Skin is warm and dry. Ecchymosis noted. There is pallor.  Psychiatric: Her speech is delayed. Cognition and memory are impaired. She is inattentive.  Nursing note and vitals reviewed.          Vital Signs: BP (!) 145/75 (BP Location: Left Arm)   Pulse 78   Temp (!) 97.5 F (36.4 C) (Oral)   Resp 20   Ht _0  (1.549 m)   Wt 89.4 kg (197 lb 1.5 oz)   SpO2 96%   BMI 37.24 kg/m  SpO2: SpO2: 96 % O2 Device: O2 Device: Nasal Cannula O2 Flow Rate: O2 Flow Rate (L/min): 2 L/min  Intake/output summary:   Intake/Output Summary (Last 24 hours) at 03/22/2018 1016 Last data filed at 03/22/2018 9702 Gross per 24 hour  Intake 1877.5 ml  Output 3600 ml  Net -1722.5 ml   LBM: Last BM Date: 03/19/18 Baseline Weight: Weight: 88.5 kg (195 lb) Most recent weight: Weight: 89.4 kg (197 lb 1.5 oz)       Palliative Assessment/Data: PPS 40%   Flowsheet Rows     Most Recent Value  Intake Tab  Referral Department  Hospitalist  Unit at Time of Referral  ICU  Palliative Care Primary Diagnosis  -- [dementia, severe thrombocytopenia]  Date Notified  03/18/18  Palliative Care Type  New Palliative care  Reason for referral  Clarify Goals of Care  Date of Admission   03/18/18  Date first seen by Palliative Care  03/20/18  # of days IP prior to Palliative referral  0  Clinical Assessment  Palliative Performance Scale Score  40%  Psychosocial & Spiritual Assessment  Palliative Care Outcomes  Patient/Family meeting held?  Yes  Who was at the meeting?  son [name])  [place] goals of care, Provided psychosocial or spiritual support, Counseled regarding hospice, ACP counseling assistance      Patient Active Problem List   Diagnosis Date Noted  . Acute ITP ([place])   . Palliative care by specialist   . Goals of care, counseling/discussion   . Diverticular hemorrhage 03/18/2018  . Thrombocytopenia ([place]) 03/18/2018  . 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 CKD (chronic kidney disease) stage 2, GFR 60-89 ml/min 07/12/2016  . Hypertensive heart disease with heart failure ([place]) 05/17/2016  . Diabetes mellitus type 2 in obese ([place]) 05/17/2016  . Dementia without behavioral disturbance 05/17/2016  . Hypotension 10/05/2015  . Osteoporosis 10/02/2014  . Vertigo 04/19/2014  . Pre-syncope 04/17/2014  . Pulmonary hypertension- PA pressure 46 mmHg 01/29/2014  . Hypokalemia 01/27/2014  . Acute on chronic diastolic heart failure ([place]) 01/24/2014  . Hypertension 11/27/2013  . Constipation 11/27/2013  . Chronic diastolic CHF (congestive heart failure) ([place]) 11/18/2013  . Bradycardia 11/01/2013  . Chronic atrial fibrillation ([place]) 11/01/2013  . Chronic anticoagulation, on Eliquis 11/01/2013  . COPD (chronic obstructive pulmonary disease) ([place]) 08/19/2013  . Chronic respiratory failure with hypoxia ([place]) 08/19/2013  . Weakness generalized 08/19/2013  . Lung nodule seen on imaging study 08/18/2013  . Nausea and vomiting 08/18/2013  . Lower GI bleed 05/18/2013  . Aspiration pneumonia ([place]) 05/12/2013  . Hyperlipidemia 05/03/2013  . COPD exacerbation ([place]) 11/20/2012  . Gout 11/17/2012  . Hypocalcemia 11/17/2012  . Upper  respiratory infection 12/17/2011  . Breast cancer of lower-inner quadrant of right female breast ([place]) 10/17/2011  . Obesity hypoventilation syndrome ([place]) 09/24/2011  .  Peripheral edema 09/24/2011  . Anemia 06/01/2011  . 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  . Bronchitis 05/27/2008  . Obstructive sleep apnea 05/27/2008    Palliative Care Assessment & Plan   Patient Profile: 77 y.o. female  with past medical history of dementia, afib on eliquis, breast and uterine cancer, DM, OSA on CPAP, obesity, HTN, diastolic CHF, COPD, sarcoidosis, anemia, asthma admitted on 03/18/2018 with rectal bleeding from SNF. On admission, labs revealed WBC 11.8, hemoglobin 12.8, platelets less than 5. INR 1.67 on eliquis. Rectal exam revealed stool with red/dark red blood. 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 Receiving IVIG x2 days and steroids. S/p 2 units of platelet transfusion. If patient does not respond to IVIG and steroids, may need CT r/o malignancy and/or bone marrow biopsy. Out of facility DNR but sons have requested FULL code this hospitalization. Palliative medicine consultation for goals of care.   Assessment: Thrombocytopenia Acute ITP Dementia Chronic afib Chronic diastolic CHF Diverticulitis  Recommendations/Plan:  FULL code/FULL scope  Continue watchful waiting. Platelets improving.   SW notified of recommendation for outpatient palliative referral at SNF on discharge.   Goals of Care and Additional Recommendations:  Limitations on Scope of Treatment: Full Scope Treatment  Code Status: FULL   Code Status Orders  (From admission, onward)        Start     Ordered   03/18/18 1738  Full code  Continuous     03/18/18 1739    Code Status History    Date Active Date Inactive Code Status Order ID Comments User Context   10/05/2015 2108 10/09/2015 2303 Full Code [id number]  Lavina Hamman, MD ED   04/17/2014 2008 04/23/2014 1707 Full Code [id number]  Elgergawy, Silver Huguenin, MD ED    01/24/2014 1818 01/30/2014 1618 Full Code [id number]  Brett Canales, PA-C Inpatient   11/01/2013 0103 11/07/2013 1849 Full Code [id number]  Louellen Molder, MD Inpatient   08/19/2013 0152 08/26/2013 2031 Full Code [id number]  Phillips Grout, MD Inpatient   05/03/2013 1751 05/14/2013 1910 Full Code [id number]  Rama, Venetia Maxon, MD Inpatient    Advance Directive Documentation     Most Recent Value  Type of Advance Directive  -- [son tim hcpoa]  Pre-existing out of facility DNR order (yellow form or pink MOST form)  -  "MOST" Form in Place?  -       Prognosis:   Unable to determine  Discharge Planning:  To Be Determined  Care plan was discussed with patient and son  Thank you for allowing the Palliative Medicine Team to assist in the care of this patient.   Time In: 0950 Time Out: 1015 Total Time 78mn Prolonged Time Billed  no      Greater than 50%  of this time was spent counseling and coordinating care related to the above assessment and plan.  MIhor Dow FNP-C Palliative Medicine Team  Phone: [phone]: [phone] Please contact Palliative Medicine Team phone at [phone] questions and concerns.

## 2018-03-22 NOTE — NC FL2 (Signed)
[place] LEVEL OF CARE SCREENING TOOL     IDENTIFICATION  Patient Name: [name] Birthdate: 11/26/1941 Sex: female Admission Date (Current Location): 03/18/2018  [facility] [place] [facility] and [place] Number:  Herbalist and Address:  [facility],  [place] [address], [place]      Provider Number: [id number]  Attending Physician Name and Address:  Hosie Poisson, MD  Relative Name and Phone Number:       Current Level of Care: Hospital Recommended Level of Care: [place] Prior Approval Number:    Date Approved/Denied:   PASRR Number:    Discharge Plan: SNF    Current Diagnoses: Patient Active Problem List   Diagnosis Date Noted  . Acute ITP ([place])   . Palliative care by specialist   . Goals of care, counseling/discussion   . Diverticular hemorrhage 03/18/2018  . Thrombocytopenia ([place]) 03/18/2018  . 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 CKD (chronic kidney disease) stage 2, GFR 60-89 ml/min 07/12/2016  . Hypertensive heart disease with heart failure ([place]) 05/17/2016  . Diabetes mellitus type 2 in obese ([place]) 05/17/2016  . Dementia without behavioral disturbance 05/17/2016  . Hypotension 10/05/2015  . Osteoporosis 10/02/2014  . Vertigo 04/19/2014  . Pre-syncope 04/17/2014  . Pulmonary hypertension- PA pressure 46 mmHg 01/29/2014  . Hypokalemia 01/27/2014  . Acute on chronic diastolic heart failure ([place]) 01/24/2014  . Hypertension 11/27/2013  . Constipation 11/27/2013  . Chronic diastolic CHF (congestive heart failure) ([place]) 11/18/2013  . Bradycardia 11/01/2013  . Chronic atrial fibrillation ([place]) 11/01/2013  . Chronic anticoagulation, on Eliquis 11/01/2013  . COPD (chronic obstructive pulmonary disease) ([place]) 08/19/2013  . Chronic respiratory failure with hypoxia ([place]) 08/19/2013  . Weakness generalized 08/19/2013  . Lung nodule seen on imaging study 08/18/2013  . Nausea and vomiting 08/18/2013  .  Lower GI bleed 05/18/2013  . Aspiration pneumonia ([place]) 05/12/2013  . Hyperlipidemia 05/03/2013  . COPD exacerbation ([place]) 11/20/2012  . Gout 11/17/2012  . Hypocalcemia 11/17/2012  . Upper respiratory infection 12/17/2011  . Breast cancer of lower-inner quadrant of right female breast ([place]) 10/17/2011  . Obesity hypoventilation syndrome ([place]) 09/24/2011  . Peripheral edema 09/24/2011  . Anemia 06/01/2011  . 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  . Bronchitis 05/27/2008  . Obstructive sleep apnea 05/27/2008    Orientation RESPIRATION BLADDER Height & Weight     Self  Normal Incontinent Weight: 197 lb 1.5 oz (89.4 kg) Height:  5\' 1"  (154.9 cm)  BEHAVIORAL SYMPTOMS/MOOD NEUROLOGICAL BOWEL NUTRITION STATUS      Incontinent Diet(Carb modified, fluid consistency)  AMBULATORY STATUS COMMUNICATION OF NEEDS Skin     Verbally Other (Comment)(MRSA)                       Personal Care Assistance Level of Assistance  Bathing, Feeding, Dressing Bathing Assistance: Maximum assistance Feeding assistance: Maximum assistance Dressing Assistance: Maximum assistance     Functional Limitations Info        Speech Info: Adequate    SPECIAL CARE FACTORS FREQUENCY  PT (By licensed PT), OT (By licensed OT)     PT Frequency: 5x OT Frequency: 5x            Contractures      Additional Factors Info  Code Status, Allergies Code Status Info: Full Allergies Info: Aspirin, Ibuprofen, Contrast Media Iodinated Diagnostic Agents, Pravachol           Current Medications (03/22/2018):  This is the  current hospital active medication list Current Facility-Administered Medications  Medication Dose Route Frequency Provider Last Rate Last Dose  . 0.9 %  sodium chloride infusion   Intravenous Continuous Hosie Poisson, MD 75 mL/hr at 03/22/18 0754    . acetaminophen (TYLENOL) tablet 650 mg  650 mg Oral Q6H PRN Hosie Poisson, MD       Or  . acetaminophen (TYLENOL) suppository 650 mg  650 mg  Rectal Q6H PRN Hosie Poisson, MD      . albuterol (PROVENTIL) (2.5 MG/3ML) 0.083% nebulizer solution 2.5 mg  2.5 mg Nebulization Q4H PRN Hosie Poisson, MD      . bacitracin ointment   Topical BID Hosie Poisson, MD      . bisacodyl (DULCOLAX) EC tablet 5 mg  5 mg Oral Daily PRN Hosie Poisson, MD      . Chlorhexidine Gluconate Cloth 2 % PADS 6 each  6 each Topical [id number] Hosie Poisson, MD   6 each at 03/22/18 0615  . donepezil (ARICEPT) tablet 10 mg  10 mg Oral QHS Hosie Poisson, MD   10 mg at 03/21/18 2202  . insulin aspart (novoLOG) injection 0-5 Units  0-5 Units Subcutaneous QHS Hosie Poisson, MD   2 Units at 03/21/18 2259  . insulin aspart (novoLOG) injection 0-9 Units  0-9 Units Subcutaneous TID WC Hosie Poisson, MD   3 Units at 03/22/18 0753  . liver oil-zinc oxide (DESITIN) 40 % ointment   Topical TID Hosie Poisson, MD      . LORazepam (ATIVAN) tablet 0.5 mg  0.5 mg Oral Q6H PRN Hosie Poisson, MD   0.5 mg at 03/21/18 2202  . magnesium citrate solution 1 Bottle  1 Bottle Oral Once PRN Hosie Poisson, MD      . metoCLOPramide (REGLAN) tablet 5 mg  5 mg Oral BID Hosie Poisson, MD   5 mg at 03/21/18 2202  . mometasone-formoterol (DULERA) 200-5 MCG/ACT inhaler 2 puff  2 puff Inhalation BID Hosie Poisson, MD   2 puff at 03/22/18 0817  . mupirocin ointment (BACTROBAN) 2 % 1 application  1 application Nasal BID Hosie Poisson, MD   1 application at 28/36/62 2203  . ondansetron (ZOFRAN) tablet 4 mg  4 mg Oral Q6H PRN Hosie Poisson, MD       Or  . ondansetron (ZOFRAN) injection 4 mg  4 mg Intravenous Q6H PRN Hosie Poisson, MD      . oxyCODONE (Oxy IR/ROXICODONE) immediate release tablet 5 mg  5 mg Oral Q6H PRN Hosie Poisson, MD      . polyethylene glycol (MIRALAX / GLYCOLAX) packet 17 g  17 g Oral Daily PRN Hosie Poisson, MD      . polyvinyl alcohol (LIQUIFILM TEARS) 1.4 % ophthalmic solution 1 drop  1 drop Both Eyes TID Hosie Poisson, MD   1 drop at 03/21/18 2206  . venlafaxine XR (EFFEXOR-XR) 24 hr  capsule 37.5 mg  37.5 mg Oral Q breakfast Hosie Poisson, MD   37.5 mg at 03/22/18 9476     Discharge Medications: Please see discharge summary for a list of discharge medications.  Relevant Imaging Results:  Relevant Lab Results:   Additional Information    Nickie Retort Work [phone]

## 2018-03-22 NOTE — Progress Notes (Addendum)
ANTICOAGULATION CONSULT NOTE - Initial Consult  Pharmacy Consult for apixaban Indication: atrial fibrillation  Allergies  Allergen Reactions  . Aspirin Other (See Comments)    Avoids due to being on blood thinners  . Ibuprofen Other (See Comments)    Avoids due to being on blood thinners  . Contrast Media [Iodinated Diagnostic Agents] Nausea And Vomiting  . Pravachol Other (See Comments)    Muscle pain    Patient Measurements: Height: 5\' 1"  (154.9 cm) Weight: 197 lb 1.5 oz (89.4 kg) IBW/kg (Calculated) : 47.8 Heparin Dosing Weight:   Vital Signs: Temp: 97.5 F (36.4 C) (04/11 0612) Temp Source: Oral (04/11 0612) BP: 145/75 (04/11 0612) Pulse Rate: 78 (04/11 0612)  Labs: Recent Labs    03/20/18 1125 03/21/18 0338 03/22/18 0600  HGB 9.4* 9.5* 9.7*  HCT 28.9* 29.4* 30.2*  PLT 56* 105* 155  CREATININE 0.80 1.12* 0.77    Estimated Creatinine Clearance: 58.9 mL/min (by C-G formula based on SCr of 0.77 mg/dL).   Medical History: Past Medical History:  Diagnosis Date  . Anemia   . Asthma   . Atrial fibrillation, persistent ([place]) 11/01/2013  . Breast cancer, stage 1 ([place]) 10/17/2011   s/p right lumpectomy and XRT  . Bronchitis   . Bruises easily   . Chills   . Chronic anticoagulation, on Eliquis 11/01/2013  . Chronic bronchitis ([place])   . Chronic diastolic CHF (congestive heart failure) ([place])   . Constipation   . COPD (chronic obstructive pulmonary disease) ([place])   . Cough   . Dementia without behavioral disturbance   . Dyslipidemia   . Fracture of left lower leg 06/1972   "no surgery; just casted it"  . Generalized weakness   . Gout attack    ankle, then wrist and hands  . Gout without tophus   . Hernia   . History of nuclear stress test 02/09/2010   dipyridamole; normal pattern of perfusion in all regions with attenuation artifact in inferior region, low risk   . Hypertension   . Insomnia   . Morbid obesity ([place])   . On home oxygen therapy    "2L prn"  (01/24/2014)  . OSA on CPAP   . Pneumonia 2012; 2014  . Protein calorie malnutrition ([place])   . Sarcoid   . Sarcoidosis   . Type II diabetes mellitus ([place])   . Uterine cancer [facility])    s/p hysterectomy  . Wheezing    Assessment: 56 YOF presenting 4/7 with rectal bleeding related to severe thrombocytopenia and apixaban.  Patient is on apixaban for history of atrial fibrillation.  Her platelet counts have recovered following IVIG and steroids.  Pharmacy asked to resumed apixaban.  Today, 03/22/2018  Renal: SCr WNL (= 0.77 mg/dl)  CBC: anemia but Hgb stable,  Thrombocytopenia - resolved  Pharmacy to follow peripherally  Goal of Therapy:   Dose per patient-specific parameters   Plan:   Resume apixaban 5mg  PO BID   Does not meet criteria for dose reduction per mfg recommendations.    Monitor for bleeding  Doreene Eland, PharmD, BCPS.   Pager: [phone] 03/22/2018 3:14 PM

## 2018-03-22 NOTE — Progress Notes (Signed)
PROGRESS NOTE    [name]  MWN:[id number] DOB: [birth year] DOA: 03/18/2018 PCP: [name], DO    Brief Narrative:  [name] is a 77 y.o. female with history of diabetes mellitus type 2, COPD, OSA, chronic diastolic heart failure, persistent atrial fibrillation on Eliquis, dementia, and diverticulosis  who presented from skilled nursing with rectal bleeding. On admission Labs showed White blood cell count 11.8, hemoglobin 12.8, platelets less than 5.  INR 1.67 (on Eliquis).  Rectal exam demonstrated obvious stool mixed with red and dark red blood. She was admitted to stepdown for further evaluation. Hematology consulted and recommendations given.         Assessment & Plan:   Principal Problem:   Thrombocytopenia ([place]) Active Problems:   Obstructive sleep apnea   COPD (chronic obstructive pulmonary disease) (HCC)   Chronic respiratory failure with hypoxia (HCC)   Chronic atrial fibrillation (HCC)   Chronic anticoagulation, on Eliquis   Chronic diastolic CHF (congestive heart failure) (HCC)   Diabetes mellitus type 2 in obese (HCC)   Dementia without behavioral disturbance   Diverticular hemorrhage   Acute ITP ([place])   Palliative care by specialist   Goals of care, counseling/discussion     Rectal bleed Possibly sec to severe thrombocytopenia, with underlying diverticulosis and being on eliquis.  S/p 2 units of platelet transfusion on admission.  Hemoglobin stabilized around 9. Platelets have improved to 155,000. No signs of bleeding.  Fibrinogen is 553, b12 leve is 568.  Rectal bleed resolved.     Severe thrombocytopenia with platelet count less than 5000: S/p 2 units of platelet transfusion, platelets are 155,000 Differential include ITP vs marrow failure.  Hematology consulted and recommendations given pt received IV ig and decadron .  Plan for discharge in am if no bleeding on eliquis.      Acute blood loss anemia:  Baseline hemoglobin is around  12, stable between 9 to 10.  Transfuse to keep hemoglobin greater than 7.  Vit b12 wnl .   Dementia without behavioral disturbance: No signs of agitation.   Chronic atrial fibrillation :  Rate controlled. On eliquis prior to admission, which is being held for severe thrombocytopenia.  Now as platelets improved, will start her on eliquis and monitor for bleeding, and if no bleeding, plan to discharge in am.    COPD: No wheezing heard.    Chronic respiratory failure: Stable.   Type 2 Diabetes mellitus:  CBG (last 3)  Recent Labs    03/22/18 0734 03/22/18 1208 03/22/18 1737  GLUCAP 217* 260* 216*   Resume SSI.  Will start her on novolog 2 units, TIDAC.    Acute kidney injury:  Suspect from dehydration, acute blood loss . Hydrated her gently  And repeat renal parameters are back to normal.     DVT prophylaxis: none, severe thrombocytopenia.  Code Status:  Full code.  Family Communication: none at bedside   Disposition Plan: tranfer to the floor.    Consultants:   Hematology.    Procedures:none.    Antimicrobials: none.    Subjective: Comfortable. No new complaints.   Objective: Vitals:   03/21/18 2135 03/22/18 0612 03/22/18 0819 03/22/18 1500  BP: (!) 151/89 (!) 145/75  128/83  Pulse: 79 78  94  Resp: 20 20  20   Temp: 97.9 F (36.6 C) (!) 97.5 F (36.4 C)  98.5 F (36.9 C)  TempSrc: Oral Oral  Oral  SpO2: 96% 94% 96% 96%  Weight:  Height:        Intake/Output Summary (Last 24 hours) at 03/22/2018 1836 Last data filed at 03/22/2018 0616 Gross per 24 hour  Intake 1095 ml  Output 1800 ml  Net -705 ml   Filed Weights   03/19/18 0841 03/20/18 0500 03/21/18 0319  Weight: 90.7 kg (200 lb) 89.4 kg (197 lb 1.5 oz) 89.4 kg (197 lb 1.5 oz)    Examination:  General exam: calm and comfortable, not in distress.  Respiratory system: good air entry bilateral. No wheezing or rhonchi.   Cardiovascular system: S1 & S2 heard, RRR. No JVD, murmurs, .   Gastrointestinal system: Abdomen is soft non tender non distended bowel sounds.  Central nervous system: alert and comfortable. Able to answer simple questions.  Extremities: trace edema, no cyanosis   Skin: bruising over the left arm.  Psychiatry: mood and affect normal.    Data Reviewed: I have personally reviewed following labs and imaging studies  CBC: Recent Labs  Lab 03/19/18 0816 03/19/18 0822 03/19/18 1246 03/20/18 1125 03/21/18 0338 03/22/18 0600  WBC 7.6  --  6.2 10.5 10.5 7.2  NEUTROABS 6.8  --  5.7 9.7* 9.7* 6.6  HGB 10.9*  --  10.9* 9.4* 9.5* 9.7*  HCT 34.4* 32.6* 33.0* 28.9* 29.4* 30.2*  MCV 81.1  --  79.7 79.4 79.7 81.2  PLT <5*  --  14* 56* 105* 366   Basic Metabolic Panel: Recent Labs  Lab 03/18/18 1410 03/19/18 0816 03/20/18 1125 03/21/18 0338 03/22/18 0600  NA 140 134* 132* 134* 136  K 4.3 4.5 4.1 4.5 4.2  CL 98* 96* 96* 96* 100*  CO2 32 29 30 29 28   GLUCOSE 148* 214* 277* 227* 244*  BUN 22* 29* 39* 53* 41*  CREATININE 0.83 0.74 0.80 1.12* 0.77  CALCIUM 9.6 9.2 8.7* 8.8* 8.5*   GFR: Estimated Creatinine Clearance: 58.9 mL/min (by C-G formula based on SCr of 0.77 mg/dL). Liver Function Tests: Recent Labs  Lab 03/18/18 1410  AST 14*  ALT 10*  ALKPHOS 63  BILITOT 0.7  PROT 6.7  ALBUMIN 3.2*   No results for input(s): LIPASE, AMYLASE in the last 168 hours. No results for input(s): AMMONIA in the last 168 hours. Coagulation Profile: Recent Labs  Lab 03/18/18 1410  INR 1.67   Cardiac Enzymes: No results for input(s): CKTOTAL, CKMB, CKMBINDEX, TROPONINI in the last 168 hours. BNP (last 3 results) No results for input(s): PROBNP in the last 8760 hours. HbA1C: No results for input(s): HGBA1C in the last 72 hours. CBG: Recent Labs  Lab 03/21/18 1646 03/21/18 2257 03/22/18 0734 03/22/18 1208 03/22/18 1737  GLUCAP 194* 207* 217* 260* 216*   Lipid Profile: No results for input(s): CHOL, HDL, LDLCALC, TRIG, CHOLHDL, LDLDIRECT in  the last 72 hours. Thyroid Function Tests: No results for input(s): TSH, T4TOTAL, FREET4, T3FREE, THYROIDAB in the last 72 hours. Anemia Panel: No results for input(s): VITAMINB12, FOLATE, FERRITIN, TIBC, IRON, RETICCTPCT in the last 72 hours. Sepsis Labs: No results for input(s): PROCALCITON, LATICACIDVEN in the last 168 hours.  Recent Results (from the past 240 hour(s))  MRSA PCR Screening     Status: Abnormal   Collection Time: 03/19/18 12:36 PM  Result Value Ref Range Status   MRSA by PCR POSITIVE (A) NEGATIVE Final    Comment:        The GeneXpert MRSA Assay (FDA approved for NASAL specimens only), is one component of a comprehensive MRSA colonization surveillance program. It is not intended to  diagnose MRSA infection nor to guide or monitor treatment for MRSA infections. RESULT CALLED TO, READ BACK BY AND VERIFIED WITH: D.CARPENTER AT 1549 ON 03/19/18 BY N.THOMPSON Performed at [facility], [place] [address] [place], [place] [address]          Radiology Studies: No results found.      Scheduled Meds: . apixaban  5 mg Oral BID  . bacitracin   Topical BID  . Chlorhexidine Gluconate Cloth  6 each Topical Q0600  . donepezil  10 mg Oral QHS  . insulin aspart  0-5 Units Subcutaneous QHS  . insulin aspart  0-9 Units Subcutaneous TID WC  . liver oil-zinc oxide   Topical TID  . metoCLOPramide  5 mg Oral BID  . mometasone-formoterol  2 puff Inhalation BID  . mupirocin ointment  1 application Nasal BID  . polyvinyl alcohol  1 drop Both Eyes TID  . venlafaxine XR  37.5 mg Oral Q breakfast   Continuous Infusions:    LOS: 4 days    Time spent: 35 minutes.     Hosie Poisson, MD [facility] Pager [phone]  If 7PM-7AM, please contact night-coverage [email or web address] Password [facility] 03/22/2018, 6:36 PM

## 2018-03-22 NOTE — Progress Notes (Signed)
Pt declined cpap again tonight.  Pt was encouraged to call should she change her mind.

## 2018-03-22 NOTE — Progress Notes (Signed)
MD aware patient has no IV access. OK to leave without IV. Planned DC 4/12.

## 2018-03-22 NOTE — NC FL2 (Addendum)
[place] LEVEL OF CARE SCREENING TOOL     IDENTIFICATION  Patient Name: [name] Birthdate: 19-Jan-1941 Sex: female Admission Date (Current Location): 03/18/2018  [facility] and [place] Number:  Herbalist and Address:  [facility],  [place] [address] [place]      Provider Number: [id number]  Attending Physician Name and Address:  Hosie Poisson, MD  Relative Name and Phone Number:       Current Level of Care: Hospital Recommended Level of Care: [place] Prior Approval Number:    Date Approved/Denied:   PASRR Number:   [id number]   Discharge Plan: SNF    Current Diagnoses: Patient Active Problem List   Diagnosis Date Noted  . Acute ITP ([place])   . Palliative care by specialist   . Goals of care, counseling/discussion   . Diverticular hemorrhage 03/18/2018  . Thrombocytopenia ([place]) 03/18/2018  . 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 CKD (chronic kidney disease) stage 2, GFR 60-89 ml/min 07/12/2016  . Hypertensive heart disease with heart failure ([place]) 05/17/2016  . Diabetes mellitus type 2 in obese ([place]) 05/17/2016  . Dementia without behavioral disturbance 05/17/2016  . Hypotension 10/05/2015  . Osteoporosis 10/02/2014  . Vertigo 04/19/2014  . Pre-syncope 04/17/2014  . Pulmonary hypertension- PA pressure 46 mmHg 01/29/2014  . Hypokalemia 01/27/2014  . Acute on chronic diastolic heart failure ([place]) 01/24/2014  . Hypertension 11/27/2013  . Constipation 11/27/2013  . Chronic diastolic CHF (congestive heart failure) ([place]) 11/18/2013  . Bradycardia 11/01/2013  . Chronic atrial fibrillation ([place]) 11/01/2013  . Chronic anticoagulation, on Eliquis 11/01/2013  . COPD (chronic obstructive pulmonary disease) () 08/19/2013  . Chronic respiratory failure with hypoxia ([place]) 08/19/2013  . Weakness generalized 08/19/2013  . Lung nodule seen on imaging study 08/18/2013  . Nausea and vomiting  08/18/2013  . Lower GI bleed 05/18/2013  . Aspiration pneumonia ([place]) 05/12/2013  . Hyperlipidemia 05/03/2013  . COPD exacerbation ([place]) 11/20/2012  . Gout 11/17/2012  . Hypocalcemia 11/17/2012  . Upper respiratory infection 12/17/2011  . Breast cancer of lower-inner quadrant of right female breast ([place]) 10/17/2011  . Obesity hypoventilation syndrome ([place]) 09/24/2011  . Peripheral edema 09/24/2011  . Anemia 06/01/2011  . 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  . Bronchitis 05/27/2008  . Obstructive sleep apnea 05/27/2008    Orientation RESPIRATION BLADDER Height & Weight     Self  O2(2 liters ) Incontinent Weight: 197 lb 1.5 oz (89.4 kg) Height:  5\' 1"  (154.9 cm)  BEHAVIORAL SYMPTOMS/MOOD NEUROLOGICAL BOWEL NUTRITION STATUS      Incontinent Diet(Diet Carb Modified Fluid consistency: Thin)  AMBULATORY STATUS COMMUNICATION OF NEEDS Skin     Verbally Normal                       Personal Care Assistance Level of Assistance  Bathing, Feeding, Dressing Bathing Assistance: Maximum assistance Feeding assistance: Limited assistance Dressing Assistance: Maximum assistance     Functional Limitations Info        Speech Info: Adequate    SPECIAL CARE FACTORS FREQUENCY  PT (By licensed PT), OT (By licensed OT)     PT Frequency: 5x OT Frequency: 5x            Contractures      Additional Factors Info  Code Status, Allergies Code Status Info: Full Code  Allergies Info: ASPIRIN, IBUPROFEN, CONTRAST MEDIA IODINATED DIAGNOSTIC AGENTS, PRAVACHOL  Current Medications (03/22/2018):  This is the current hospital active medication list Current Facility-Administered Medications  Medication Dose Route Frequency Provider Last Rate Last Dose  . 0.9 %  sodium chloride infusion   Intravenous Continuous Hosie Poisson, MD 75 mL/hr at 03/22/18 0754    . acetaminophen (TYLENOL) tablet 650 mg  650 mg Oral Q6H PRN Hosie Poisson, MD       Or  . acetaminophen (TYLENOL)  suppository 650 mg  650 mg Rectal Q6H PRN Hosie Poisson, MD      . albuterol (PROVENTIL) (2.5 MG/3ML) 0.083% nebulizer solution 2.5 mg  2.5 mg Nebulization Q4H PRN Hosie Poisson, MD      . bacitracin ointment   Topical BID Hosie Poisson, MD      . bisacodyl (DULCOLAX) EC tablet 5 mg  5 mg Oral Daily PRN Hosie Poisson, MD      . Chlorhexidine Gluconate Cloth 2 % PADS 6 each  6 each Topical [id number] Hosie Poisson, MD   6 each at 03/22/18 0615  . donepezil (ARICEPT) tablet 10 mg  10 mg Oral QHS Hosie Poisson, MD   10 mg at 03/21/18 2202  . insulin aspart (novoLOG) injection 0-5 Units  0-5 Units Subcutaneous QHS Hosie Poisson, MD   2 Units at 03/21/18 2259  . insulin aspart (novoLOG) injection 0-9 Units  0-9 Units Subcutaneous TID WC Hosie Poisson, MD   3 Units at 03/22/18 0753  . liver oil-zinc oxide (DESITIN) 40 % ointment   Topical TID Hosie Poisson, MD      . LORazepam (ATIVAN) tablet 0.5 mg  0.5 mg Oral Q6H PRN Hosie Poisson, MD   0.5 mg at 03/21/18 2202  . magnesium citrate solution 1 Bottle  1 Bottle Oral Once PRN Hosie Poisson, MD      . metoCLOPramide (REGLAN) tablet 5 mg  5 mg Oral BID Hosie Poisson, MD   5 mg at 03/21/18 2202  . mometasone-formoterol (DULERA) 200-5 MCG/ACT inhaler 2 puff  2 puff Inhalation BID Hosie Poisson, MD   2 puff at 03/22/18 0817  . mupirocin ointment (BACTROBAN) 2 % 1 application  1 application Nasal BID Hosie Poisson, MD   1 application at 60/45/40 2203  . ondansetron (ZOFRAN) tablet 4 mg  4 mg Oral Q6H PRN Hosie Poisson, MD       Or  . ondansetron (ZOFRAN) injection 4 mg  4 mg Intravenous Q6H PRN Hosie Poisson, MD      . oxyCODONE (Oxy IR/ROXICODONE) immediate release tablet 5 mg  5 mg Oral Q6H PRN Hosie Poisson, MD      . polyethylene glycol (MIRALAX / GLYCOLAX) packet 17 g  17 g Oral Daily PRN Hosie Poisson, MD      . polyvinyl alcohol (LIQUIFILM TEARS) 1.4 % ophthalmic solution 1 drop  1 drop Both Eyes TID Hosie Poisson, MD   1 drop at 03/21/18 2206  .  venlafaxine XR (EFFEXOR-XR) 24 hr capsule 37.5 mg  37.5 mg Oral Q breakfast Hosie Poisson, MD   37.5 mg at 03/22/18 9811     Discharge Medications: Please see discharge summary for a list of discharge medications.  Relevant Imaging Results:  Relevant Lab Results:   Additional Information SS#: [id number]  Weston Anna, LCSW

## 2018-03-23 DIAGNOSIS — I5032 Chronic diastolic (congestive) heart failure: Secondary | ICD-10-CM

## 2018-03-23 LAB — CBC WITH DIFFERENTIAL/PLATELET
BASOS PCT: 0 %
Basophils Absolute: 0 10*3/uL (ref 0.0–0.1)
Eosinophils Absolute: 0 10*3/uL (ref 0.0–0.7)
Eosinophils Relative: 0 %
HEMATOCRIT: 32.3 % — AB (ref 36.0–46.0)
Hemoglobin: 10.1 g/dL — ABNORMAL LOW (ref 12.0–15.0)
Lymphocytes Relative: 11 %
Lymphs Abs: 0.9 10*3/uL (ref 0.7–4.0)
MCH: 25.7 pg — ABNORMAL LOW (ref 26.0–34.0)
MCHC: 31.3 g/dL (ref 30.0–36.0)
MCV: 82.2 fL (ref 78.0–100.0)
MONO ABS: 0.9 10*3/uL (ref 0.1–1.0)
MONOS PCT: 11 %
NEUTROS ABS: 6.1 10*3/uL (ref 1.7–7.7)
Neutrophils Relative %: 78 %
Platelets: 221 10*3/uL (ref 150–400)
RBC: 3.93 MIL/uL (ref 3.87–5.11)
RDW: 15 % (ref 11.5–15.5)
WBC: 7.9 10*3/uL (ref 4.0–10.5)

## 2018-03-23 LAB — BASIC METABOLIC PANEL
Anion gap: 9 (ref 5–15)
BUN: 40 mg/dL — ABNORMAL HIGH (ref 6–20)
CALCIUM: 8.6 mg/dL — AB (ref 8.9–10.3)
CO2: 29 mmol/L (ref 22–32)
Chloride: 99 mmol/L — ABNORMAL LOW (ref 101–111)
Creatinine, Ser: 0.75 mg/dL (ref 0.44–1.00)
GLUCOSE: 126 mg/dL — AB (ref 65–99)
Potassium: 4.1 mmol/L (ref 3.5–5.1)
Sodium: 137 mmol/L (ref 135–145)

## 2018-03-23 LAB — GLUCOSE, CAPILLARY: GLUCOSE-CAPILLARY: 116 mg/dL — AB (ref 65–99)

## 2018-03-23 NOTE — Discharge Summary (Signed)
Physician Discharge Summary  [name] PFX:[id number] DOB: [birth year] DOA: 03/18/2018  PCP: [name], DO  Admit date: 03/18/2018 Discharge date: 03/23/2018  Admitted From: [facility].  Disposition:  [facility].   Recommendations for Outpatient Follow-up:  1. Follow up with PCP in 1-2 weeks 2. Please obtain BMP/CBC in one week Please follow up with Dr Lindi Adie next week as recommended.   Discharge Condition:guarded.  CODE STATUS: full code.  Diet recommendation: Heart Healthy   Brief/Interim Summary: Sharon G Myersis a 77 y.o.femalewith history ofdiabetes mellitus type 2, COPD, OSA, chronic diastolic heart failure, persistent atrial fibrillation on Eliquis, dementia, and diverticulosiswho presented from skilled nursing with rectal bleeding. On admission Labs showedWhite blood cell count 11.8, hemoglobin 12.8, platelets less than 5.INR 1.67(on Eliquis).Rectal exam demonstrated obvious stool mixed with red and dark red blood. She was admitted to stepdown for further evaluation. Hematology consulted and recommendations given.     Discharge Diagnoses:  Principal Problem:   Thrombocytopenia ([place]) Active Problems:   Obstructive sleep apnea   COPD (chronic obstructive pulmonary disease) (HCC)   Chronic respiratory failure with hypoxia (HCC)   Chronic atrial fibrillation (HCC)   Chronic anticoagulation, on Eliquis   Chronic diastolic CHF (congestive heart failure) (HCC)   Diabetes mellitus type 2 in obese (HCC)   Dementia without behavioral disturbance   Diverticular hemorrhage   Acute ITP ([place])   Palliative care by specialist   Goals of care, counseling/discussion  Rectal bleed Possibly sec to severe thrombocytopenia, with underlying diverticulosis and being on eliquis.  S/p 2 units of platelet transfusion on admission.  Hemoglobin stabilized around 9. Platelets have improved to 221,000. No signs of bleeding.  Fibrinogen is 553, b12 leve is 568.  Rectal bleed  resolved.     Severe thrombocytopenia with platelet count less than 5000: S/p 2 units of platelet transfusion, platelets are 221,000 Differential include ITP vs marrow failure.  Hematology consulted and recommendations given pt received IV ig and decadron . Her platelet count improved. Plan for discharge in am if no bleeding on eliquis.    AKI: resolved with hydration.     Acute blood loss anemia:  Baseline hemoglobin is around 12, stable between 9 to 10.  Vit b12 wnl .   Dementia without behavioral disturbance: No signs of agitation.   Chronic atrial fibrillation :  Rate controlled. On eliquis prior to admission, which was held for severe thrombocytopenia.  Now as platelets improved,started her back on eliquis, no signs of bleeding.    Chronic diastolic heart failure: she appears euvolemic.    COPD: No wheezing and on 2 lit of Gas Oxygen.    Discharge Instructions  Discharge Instructions    Diet - low sodium heart healthy   Complete by:  As directed    Discharge instructions   Complete by:  As directed    Follow up with Dr Lindi Adie next week.     Allergies as of 03/23/2018      Reactions   Aspirin Other (See Comments)   Avoids due to being on blood thinners   Ibuprofen Other (See Comments)   Avoids due to being on blood thinners   Contrast Media [iodinated Diagnostic Agents] Nausea And Vomiting   Pravachol Other (See Comments)   Muscle pain      Medication List    TAKE these medications   albuterol (2.5 MG/3ML) 0.083% nebulizer solution Commonly known as:  PROVENTIL Take by nebulization every 6 (six) hours as needed for wheezing or shortness  of breath.   apixaban 5 MG Tabs tablet Commonly known as:  ELIQUIS TAKE 1 TABLET (5 MG TOTAL) BY MOUTH 2 (TWO) TIMES DAILY.   Calcium Carb-Cholecalciferol 500-200 MG-UNIT Tabs Take 1 tablet by mouth 2 (two) times daily. Reported on 01/26/2016   diltiazem 240 MG 24 hr capsule Commonly known as:  CARDIZEM  CD Take 240 mg by mouth every morning.   docusate sodium 100 MG capsule Commonly known as:  COLACE Take 100 mg by mouth daily as needed for mild constipation.   donepezil 10 MG tablet Commonly known as:  ARICEPT Take 10 mg by mouth at bedtime.   furosemide 20 MG tablet Commonly known as:  LASIX Take 20 mg by mouth daily.   hydrocerin Crea Apply 1 application topically daily as needed (dry feet).   lactulose 10 GM/15ML solution Commonly known as:  CHRONULAC Take by mouth daily. Give 15 mL by mouth   metoCLOPramide 5 MG tablet Commonly known as:  REGLAN Take 5 mg by mouth 2 (two) times daily.   ondansetron 8 MG tablet Commonly known as:  ZOFRAN Take 8 mg by mouth every 8 (eight) hours as needed for nausea or vomiting.   OXYGEN Inhale 2 L/min into the lungs continuous.   SYMBICORT 160-4.5 MCG/ACT inhaler Generic drug:  budesonide-formoterol Inhale 2 puffs into the lungs 2 (two) times daily. Rinse mouth well after use   SYSTANE COMPLETE OP Place 1 drop into both eyes 3 (three) times daily.   venlafaxine XR 37.5 MG 24 hr capsule Commonly known as:  EFFEXOR-XR Take 37.5 mg by mouth daily with breakfast.   Vitamin D (Ergocalciferol) 50000 units Caps capsule Commonly known as:  DRISDOL Take 50,000 Units by mouth every 28 (twenty-eight) days.       Allergies  Allergen Reactions  . Aspirin Other (See Comments)    Avoids due to being on blood thinners  . Ibuprofen Other (See Comments)    Avoids due to being on blood thinners  . Contrast Media [Iodinated Diagnostic Agents] Nausea And Vomiting  . Pravachol Other (See Comments)    Muscle pain    Consultations:  Hematology Dr Lindi Adie.    Procedures/Studies: Dg Chest Port 1 View  Result Date: 03/18/2018 CLINICAL DATA:  Shortness of breath today EXAM: PORTABLE CHEST 1 VIEW COMPARISON:  October 05, 2015 FINDINGS: The heart size and mediastinal contours are within normal limits. Mild chronic increased pulmonary  interstitium is identified bilaterally unchanged. There is no focal pneumonia or pleural effusion. The visualized skeletal structures are stable. IMPRESSION: No active cardiopulmonary disease. Electronically Signed   By: Abelardo Diesel M.D.   On: 03/18/2018 17:27    None.    Subjective:  No new complaints.  Discharge Exam: Vitals:   03/23/18 0555 03/23/18 0752  BP: 127/68   Pulse: 90   Resp: 19   Temp: 97.8 F (36.6 C)   SpO2: 99% 98%   Vitals:   03/22/18 1952 03/22/18 2130 03/23/18 0555 03/23/18 0752  BP:  126/73 127/68   Pulse:  68 90   Resp:  19 19   Temp:  97.9 F (36.6 C) 97.8 F (36.6 C)   TempSrc:  Oral Oral   SpO2: 96% 100% 99% 98%  Weight:      Height:        General: Pt is alert, awake, not in acute distress Cardiovascular: RRR, S1/S2 +, no rubs, no gallops Respiratory: CTA bilaterally, no wheezing, no rhonchi Abdominal: Soft, NT, ND, bowel sounds +  Extremities: no edema, no cyanosis    The results of significant diagnostics from this hospitalization (including imaging, microbiology, ancillary and laboratory) are listed below for reference.     Microbiology: Recent Results (from the past 240 hour(s))  MRSA PCR Screening     Status: Abnormal   Collection Time: 03/19/18 12:36 PM  Result Value Ref Range Status   MRSA by PCR POSITIVE (A) NEGATIVE Final    Comment:        The GeneXpert MRSA Assay (FDA approved for NASAL specimens only), is one component of a comprehensive MRSA colonization surveillance program. It is not intended to diagnose MRSA infection nor to guide or monitor treatment for MRSA infections. RESULT CALLED TO, READ BACK BY AND VERIFIED WITH: D.CARPENTER AT 1549 ON 03/19/18 BY N.THOMPSON Performed at [facility], [place] [address], [place], [place] [address]      Labs: BNP (last 3 results) No results for input(s): BNP in the last 8760 hours. Basic Metabolic Panel: Recent Labs  Lab 03/19/18 0816 03/20/18 1125  03/21/18 0338 03/22/18 0600 03/23/18 0550  NA 134* 132* 134* 136 137  K 4.5 4.1 4.5 4.2 4.1  CL 96* 96* 96* 100* 99*  CO2 29 30 29 28 29   GLUCOSE 214* 277* 227* 244* 126*  BUN 29* 39* 53* 41* 40*  CREATININE 0.74 0.80 1.12* 0.77 0.75  CALCIUM 9.2 8.7* 8.8* 8.5* 8.6*   Liver Function Tests: Recent Labs  Lab 03/18/18 1410  AST 14*  ALT 10*  ALKPHOS 63  BILITOT 0.7  PROT 6.7  ALBUMIN 3.2*   No results for input(s): LIPASE, AMYLASE in the last 168 hours. No results for input(s): AMMONIA in the last 168 hours. CBC: Recent Labs  Lab 03/19/18 1246 03/20/18 1125 03/21/18 0338 03/22/18 0600 03/23/18 0550  WBC 6.2 10.5 10.5 7.2 7.9  NEUTROABS 5.7 9.7* 9.7* 6.6 6.1  HGB 10.9* 9.4* 9.5* 9.7* 10.1*  HCT 33.0* 28.9* 29.4* 30.2* 32.3*  MCV 79.7 79.4 79.7 81.2 82.2  PLT 14* 56* 105* 155 221   Cardiac Enzymes: No results for input(s): CKTOTAL, CKMB, CKMBINDEX, TROPONINI in the last 168 hours. BNP: Invalid input(s): POCBNP CBG: Recent Labs  Lab 03/21/18 2257 03/22/18 0734 03/22/18 1208 03/22/18 1737 03/23/18 0758  GLUCAP 207* 217* 260* 216* 116*   D-Dimer No results for input(s): DDIMER in the last 72 hours. Hgb A1c No results for input(s): HGBA1C in the last 72 hours. Lipid Profile No results for input(s): CHOL, HDL, LDLCALC, TRIG, CHOLHDL, LDLDIRECT in the last 72 hours. Thyroid function studies No results for input(s): TSH, T4TOTAL, T3FREE, THYROIDAB in the last 72 hours.  Invalid input(s): FREET3 Anemia work up No results for input(s): VITAMINB12, FOLATE, FERRITIN, TIBC, IRON, RETICCTPCT in the last 72 hours. Urinalysis    Component Value Date/Time   COLORURINE YELLOW 10/06/2015 1210   APPEARANCEUR CLEAR 10/06/2015 1210   LABSPEC 1.014 10/06/2015 1210   PHURINE 6.0 10/06/2015 1210   GLUCOSEU NEGATIVE 10/06/2015 1210   HGBUR NEGATIVE 10/06/2015 1210   BILIRUBINUR NEGATIVE 10/06/2015 1210   KETONESUR NEGATIVE 10/06/2015 1210   PROTEINUR NEGATIVE  10/06/2015 1210   UROBILINOGEN 1.0 10/06/2015 1210   NITRITE NEGATIVE 10/06/2015 1210   LEUKOCYTESUR SMALL (A) 10/06/2015 1210   Sepsis Labs Invalid input(s): PROCALCITONIN,  WBC,  LACTICIDVEN Microbiology Recent Results (from the past 240 hour(s))  MRSA PCR Screening     Status: Abnormal   Collection Time: 03/19/18 12:36 PM  Result Value Ref Range Status   MRSA  by PCR POSITIVE (A) NEGATIVE Final    Comment:        The GeneXpert MRSA Assay (FDA approved for NASAL specimens only), is one component of a comprehensive MRSA colonization surveillance program. It is not intended to diagnose MRSA infection nor to guide or monitor treatment for MRSA infections. RESULT CALLED TO, READ BACK BY AND VERIFIED WITH: D.CARPENTER AT 1549 ON 03/19/18 BY N.THOMPSON Performed at [facility], [place] [address] [place],  [address]      Time coordinating discharge: 35 minutes.  SIGNED:   Hosie Poisson, MD  [facility] 03/23/2018, 10:25 AM Pager   If 7PM-7AM, please contact night-coverage [email or web address] Password [email or web address]

## 2018-03-23 NOTE — Progress Notes (Signed)
Patient from [facility].  LCSW confirmed return with facility.   LCSW faxed dc docs to facility.   Patient will transport by [facility].   RN report number: [phone]   SUG

## 2018-03-23 NOTE — Progress Notes (Signed)
PT Cancellation Note  Patient Details Name: [name] MRN: [id number] DOB: 04-06-1941   Cancelled Treatment:    Reason Eval/Treat Not Completed: Other (comment);PT screened, no needs identified, will sign off(from SNF, back to SNF--[facility]-defer eval)   [facility] 03/23/2018, 11:35 AM

## 2018-03-23 NOTE — Progress Notes (Signed)
Patient from [facility].   Patient will return at dc.

## 2018-03-23 NOTE — Progress Notes (Signed)
[facility] arrived for patient. Son [name] notified. [facility] called and report provided.

## 2018-03-26 ENCOUNTER — Telehealth: Payer: Self-pay | Admitting: *Deleted

## 2018-03-26 NOTE — Telephone Encounter (Signed)
This RN contacted nurse at [facility] per need for weekly CBC's with differential post d/c from hospital 03/23/2018  Phone number [phone] Pt is in room 906 on the [place] hall.  Spoke with pt's nurse, Belenda Cruise per above and faxed order to her at [phone].

## 2018-03-30 ENCOUNTER — Telehealth: Payer: Self-pay

## 2018-03-30 NOTE — Telephone Encounter (Signed)
Returned pt son call regarding if she needed to be seen by Dr. Lindi Adie or not after being discharged from hospital. She is currently at a facility and they are doing weekly cbc w/diff checks. I informed him as long as they fax those results to [place] and her counts are in good standing we will not need to see her in the office. Fax number given and he stated he will relay message to facility to fax those lab results to [place]. No further questions at this time.  Cyndia Bent RN

## 2018-04-09 ENCOUNTER — Other Ambulatory Visit: Payer: Self-pay

## 2018-04-09 ENCOUNTER — Encounter (HOSPITAL_COMMUNITY): Payer: Self-pay | Admitting: Internal Medicine

## 2018-04-09 ENCOUNTER — Inpatient Hospital Stay (HOSPITAL_COMMUNITY)
Admission: EM | Admit: 2018-04-09 | Discharge: 2018-04-17 | DRG: 377 | Disposition: A | Discharge status: Skilled Nursing Facility | Payer: Medicare Other | Attending: Internal Medicine | Admitting: Internal Medicine

## 2018-04-09 DIAGNOSIS — I272 Pulmonary hypertension, unspecified: Secondary | ICD-10-CM | POA: Diagnosis present

## 2018-04-09 DIAGNOSIS — Z7401 Bed confinement status: Secondary | ICD-10-CM

## 2018-04-09 DIAGNOSIS — D693 Immune thrombocytopenic purpura: Secondary | ICD-10-CM | POA: Diagnosis present

## 2018-04-09 DIAGNOSIS — Z8542 Personal history of malignant neoplasm of other parts of uterus: Secondary | ICD-10-CM | POA: Diagnosis not present

## 2018-04-09 DIAGNOSIS — K5731 Diverticulosis of large intestine without perforation or abscess with bleeding: Secondary | ICD-10-CM | POA: Diagnosis not present

## 2018-04-09 DIAGNOSIS — E669 Obesity, unspecified: Secondary | ICD-10-CM | POA: Diagnosis not present

## 2018-04-09 DIAGNOSIS — I482 Chronic atrial fibrillation, unspecified: Secondary | ICD-10-CM | POA: Diagnosis present

## 2018-04-09 DIAGNOSIS — K921 Melena: Secondary | ICD-10-CM | POA: Diagnosis present

## 2018-04-09 DIAGNOSIS — Z794 Long term (current) use of insulin: Secondary | ICD-10-CM | POA: Diagnosis present

## 2018-04-09 DIAGNOSIS — Z853 Personal history of malignant neoplasm of breast: Secondary | ICD-10-CM

## 2018-04-09 DIAGNOSIS — E1165 Type 2 diabetes mellitus with hyperglycemia: Secondary | ICD-10-CM | POA: Diagnosis present

## 2018-04-09 DIAGNOSIS — Z91041 Radiographic dye allergy status: Secondary | ICD-10-CM

## 2018-04-09 DIAGNOSIS — G4733 Obstructive sleep apnea (adult) (pediatric): Secondary | ICD-10-CM | POA: Diagnosis present

## 2018-04-09 DIAGNOSIS — Z6832 Body mass index (BMI) 32.0-32.9, adult: Secondary | ICD-10-CM | POA: Diagnosis not present

## 2018-04-09 DIAGNOSIS — G309 Alzheimer's disease, unspecified: Secondary | ICD-10-CM

## 2018-04-09 DIAGNOSIS — E1169 Type 2 diabetes mellitus with other specified complication: Secondary | ICD-10-CM | POA: Diagnosis not present

## 2018-04-09 DIAGNOSIS — N182 Chronic kidney disease, stage 2 (mild): Secondary | ICD-10-CM | POA: Diagnosis present

## 2018-04-09 DIAGNOSIS — Z90710 Acquired absence of both cervix and uterus: Secondary | ICD-10-CM | POA: Diagnosis not present

## 2018-04-09 DIAGNOSIS — E43 Unspecified severe protein-calorie malnutrition: Secondary | ICD-10-CM | POA: Diagnosis present

## 2018-04-09 DIAGNOSIS — Z66 Do not resuscitate: Secondary | ICD-10-CM | POA: Diagnosis present

## 2018-04-09 DIAGNOSIS — Z800 Family history of malignant neoplasm of digestive organs: Secondary | ICD-10-CM

## 2018-04-09 DIAGNOSIS — I5032 Chronic diastolic (congestive) heart failure: Secondary | ICD-10-CM | POA: Diagnosis present

## 2018-04-09 DIAGNOSIS — I130 Hypertensive heart and chronic kidney disease with heart failure and stage 1 through stage 4 chronic kidney disease, or unspecified chronic kidney disease: Secondary | ICD-10-CM | POA: Diagnosis present

## 2018-04-09 DIAGNOSIS — J441 Chronic obstructive pulmonary disease with (acute) exacerbation: Secondary | ICD-10-CM | POA: Diagnosis present

## 2018-04-09 DIAGNOSIS — I481 Persistent atrial fibrillation: Secondary | ICD-10-CM | POA: Diagnosis present

## 2018-04-09 DIAGNOSIS — E785 Hyperlipidemia, unspecified: Secondary | ICD-10-CM | POA: Diagnosis present

## 2018-04-09 DIAGNOSIS — F0390 Unspecified dementia without behavioral disturbance: Secondary | ICD-10-CM | POA: Diagnosis present

## 2018-04-09 DIAGNOSIS — J449 Chronic obstructive pulmonary disease, unspecified: Secondary | ICD-10-CM | POA: Diagnosis present

## 2018-04-09 DIAGNOSIS — G4700 Insomnia, unspecified: Secondary | ICD-10-CM | POA: Diagnosis present

## 2018-04-09 DIAGNOSIS — D631 Anemia in chronic kidney disease: Secondary | ICD-10-CM | POA: Diagnosis present

## 2018-04-09 DIAGNOSIS — Z7901 Long term (current) use of anticoagulants: Secondary | ICD-10-CM | POA: Diagnosis not present

## 2018-04-09 DIAGNOSIS — E1122 Type 2 diabetes mellitus with diabetic chronic kidney disease: Secondary | ICD-10-CM | POA: Diagnosis present

## 2018-04-09 DIAGNOSIS — Z96653 Presence of artificial knee joint, bilateral: Secondary | ICD-10-CM | POA: Diagnosis present

## 2018-04-09 DIAGNOSIS — C50311 Malignant neoplasm of lower-inner quadrant of right female breast: Secondary | ICD-10-CM | POA: Diagnosis not present

## 2018-04-09 DIAGNOSIS — Z886 Allergy status to analgesic agent status: Secondary | ICD-10-CM

## 2018-04-09 DIAGNOSIS — Z79811 Long term (current) use of aromatase inhibitors: Secondary | ICD-10-CM

## 2018-04-09 DIAGNOSIS — Z8049 Family history of malignant neoplasm of other genital organs: Secondary | ICD-10-CM

## 2018-04-09 DIAGNOSIS — Z888 Allergy status to other drugs, medicaments and biological substances status: Secondary | ICD-10-CM

## 2018-04-09 DIAGNOSIS — T380X5A Adverse effect of glucocorticoids and synthetic analogues, initial encounter: Secondary | ICD-10-CM | POA: Diagnosis not present

## 2018-04-09 DIAGNOSIS — M109 Gout, unspecified: Secondary | ICD-10-CM | POA: Diagnosis present

## 2018-04-09 DIAGNOSIS — D508 Other iron deficiency anemias: Secondary | ICD-10-CM | POA: Diagnosis not present

## 2018-04-09 DIAGNOSIS — Z9981 Dependence on supplemental oxygen: Secondary | ICD-10-CM | POA: Diagnosis not present

## 2018-04-09 DIAGNOSIS — I10 Essential (primary) hypertension: Secondary | ICD-10-CM | POA: Diagnosis present

## 2018-04-09 DIAGNOSIS — D696 Thrombocytopenia, unspecified: Secondary | ICD-10-CM | POA: Diagnosis present

## 2018-04-09 DIAGNOSIS — Z801 Family history of malignant neoplasm of trachea, bronchus and lung: Secondary | ICD-10-CM

## 2018-04-09 DIAGNOSIS — F0280 Dementia in other diseases classified elsewhere without behavioral disturbance: Secondary | ICD-10-CM

## 2018-04-09 DIAGNOSIS — Z170 Estrogen receptor positive status [ER+]: Secondary | ICD-10-CM

## 2018-04-09 DIAGNOSIS — D649 Anemia, unspecified: Secondary | ICD-10-CM | POA: Diagnosis present

## 2018-04-09 DIAGNOSIS — K922 Gastrointestinal hemorrhage, unspecified: Secondary | ICD-10-CM | POA: Diagnosis present

## 2018-04-09 DIAGNOSIS — D860 Sarcoidosis of lung: Secondary | ICD-10-CM | POA: Diagnosis present

## 2018-04-09 DIAGNOSIS — Z923 Personal history of irradiation: Secondary | ICD-10-CM

## 2018-04-09 DIAGNOSIS — Z833 Family history of diabetes mellitus: Secondary | ICD-10-CM

## 2018-04-09 DIAGNOSIS — K5790 Diverticulosis of intestine, part unspecified, without perforation or abscess without bleeding: Secondary | ICD-10-CM | POA: Diagnosis present

## 2018-04-09 LAB — COMPREHENSIVE METABOLIC PANEL
ALT: 13 U/L — ABNORMAL LOW (ref 14–54)
ANION GAP: 11 (ref 5–15)
AST: 19 U/L (ref 15–41)
Albumin: 2.8 g/dL — ABNORMAL LOW (ref 3.5–5.0)
Alkaline Phosphatase: 55 U/L (ref 38–126)
BILIRUBIN TOTAL: 0.4 mg/dL (ref 0.3–1.2)
BUN: 23 mg/dL — ABNORMAL HIGH (ref 6–20)
CO2: 30 mmol/L (ref 22–32)
Calcium: 9.4 mg/dL (ref 8.9–10.3)
Chloride: 98 mmol/L — ABNORMAL LOW (ref 101–111)
Creatinine, Ser: 0.78 mg/dL (ref 0.44–1.00)
GFR calc Af Amer: >60 mL/min (ref >60)
GFR calc non Af Amer: >60 mL/min (ref >60)
GLUCOSE: 177 mg/dL — AB (ref 65–99)
POTASSIUM: 4.4 mmol/L (ref 3.5–5.1)
Sodium: 139 mmol/L (ref 135–145)
TOTAL PROTEIN: 6.8 g/dL (ref 6.5–8.1)

## 2018-04-09 LAB — DIFFERENTIAL
BASOS PCT: 0 %
Basophils Absolute: 0 10*3/uL (ref 0.0–0.1)
EOS ABS: 0.1 10*3/uL (ref 0.0–0.7)
Eosinophils Relative: 1 %
LYMPHS ABS: 1.3 10*3/uL (ref 0.7–4.0)
Lymphocytes Relative: 13 %
MONO ABS: 0.6 10*3/uL (ref 0.1–1.0)
Monocytes Relative: 6 %
Neutro Abs: 8 10*3/uL — ABNORMAL HIGH (ref 1.7–7.7)
Neutrophils Relative %: 80 %

## 2018-04-09 LAB — CBC
HEMATOCRIT: 35.1 % — AB (ref 36.0–46.0)
HEMOGLOBIN: 10.9 g/dL — AB (ref 12.0–15.0)
MCH: 25 pg — ABNORMAL LOW (ref 26.0–34.0)
MCHC: 31.1 g/dL (ref 30.0–36.0)
MCV: 80.5 fL (ref 78.0–100.0)
Platelets: 6 10*3/uL — CL (ref 150–400)
RBC: 4.36 MIL/uL (ref 3.87–5.11)
RDW: 15.3 % (ref 11.5–15.5)
WBC: 9.9 10*3/uL (ref 4.0–10.5)

## 2018-04-09 LAB — POC OCCULT BLOOD, ED: FECAL OCCULT BLD: POSITIVE — AB

## 2018-04-09 LAB — PROTIME-INR
INR: 1.49
PROTHROMBIN TIME: 17.8 s — AB (ref 11.4–15.2)

## 2018-04-09 LAB — TYPE AND SCREEN
ABO/RH(D): O NEG
Antibody Screen: NEGATIVE

## 2018-04-09 LAB — FIBRINOGEN: Fibrinogen: 616 mg/dL — ABNORMAL HIGH (ref 210–475)

## 2018-04-09 LAB — APTT: aPTT: 36 seconds (ref 24–36)

## 2018-04-09 MED ORDER — DEXAMETHASONE SODIUM PHOSPHATE 10 MG/ML IJ SOLN
20.0000 mg | INTRAMUSCULAR | Status: DC
  Administered 2018-04-09 – 2018-04-11 (×3): 20 mg via INTRAVENOUS
  Filled 2018-04-09 (×3): qty 2

## 2018-04-09 MED ORDER — SODIUM CHLORIDE 0.9 % IV SOLN
Freq: Once | INTRAVENOUS | Status: AC
  Administered 2018-04-10: 03:00:00 via INTRAVENOUS

## 2018-04-09 NOTE — ED Provider Notes (Addendum)
[facility] DEPT Provider Note   CSN: [id number] Arrival date & time: 04/09/18  1512     History   Chief Complaint Chief Complaint  Patient presents with  . Rectal Bleeding  . abnormal lab value    low platelet count    HPI [name] is a 77 y.o. female.  HPI  A LEVEL 5 CAVEAT PERTAINS DUE TO DEMENTIA.  Patient presents with complaints of rectal bleeding and low platelets.  She resides at [facility] and nurse practitioner there noted blood in her stool this morning.  She checked her blood work and platelets were noted to be 3000.  She was discharged from the hospital earlier this month for similar presentation.  She was treated with platelet transfusion and IVIG at that time and at discharge platelets were 221,000.  1 week ago per paperwork from nursing home platelets were 2 94,000.  She continues to take eliquis.    Past Medical History:  Diagnosis Date  . Anemia   . Asthma   . Atrial fibrillation, persistent ([place]) 11/01/2013  . Breast cancer, stage 1 ([place]) 10/17/2011   s/p right lumpectomy and XRT  . Bronchitis   . Bruises easily   . Chills   . Chronic anticoagulation, on Eliquis 11/01/2013  . Chronic bronchitis ([place])   . Chronic diastolic CHF (congestive heart failure) ([place])   . Constipation   . COPD (chronic obstructive pulmonary disease) ([place])   . Cough   . Dementia without behavioral disturbance   . Dyslipidemia   . Fracture of left lower leg 06/1972   "no surgery; just casted it"  . Generalized weakness   . Gout attack    ankle, then wrist and hands  . Gout without tophus   . Hernia   . History of nuclear stress test 02/09/2010   dipyridamole; normal pattern of perfusion in all regions with attenuation artifact in inferior region, low risk   . Hypertension   . Insomnia   . Morbid obesity ([place])   . On home oxygen therapy    "2L prn" (01/24/2014)  . OSA on CPAP   . Pneumonia 2012; 2014  . Protein calorie malnutrition ([place])    . Sarcoid   . Sarcoidosis   . Type II diabetes mellitus ([place])   . Uterine cancer [facility])    s/p hysterectomy  . Wheezing     Patient Active Problem List   Diagnosis Date Noted  . Acute ITP ([place])   . Palliative care by specialist   . Goals of care, counseling/discussion   . Diverticular hemorrhage 03/18/2018  . Thrombocytopenia ([place]) 03/18/2018  . 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 CKD (chronic kidney disease) stage 2, GFR 60-89 ml/min 07/12/2016  . Hypertensive heart disease with heart failure ([place]) 05/17/2016  . Diabetes mellitus type 2 in obese ([place]) 05/17/2016  . Dementia without behavioral disturbance 05/17/2016  . Hypotension 10/05/2015  . Osteoporosis 10/02/2014  . Vertigo 04/19/2014  . Pre-syncope 04/17/2014  . Pulmonary hypertension- PA pressure 46 mmHg 01/29/2014  . Hypokalemia 01/27/2014  . Acute on chronic diastolic heart failure ([place]) 01/24/2014  . Hypertension 11/27/2013  . Constipation 11/27/2013  . Chronic diastolic CHF (congestive heart failure) ([place]) 11/18/2013  . Bradycardia 11/01/2013  . Chronic atrial fibrillation ([place]) 11/01/2013  . Chronic anticoagulation, on Eliquis 11/01/2013  . COPD (chronic obstructive pulmonary disease) ([place]) 08/19/2013  . Chronic respiratory failure with hypoxia ([place]) 08/19/2013  . Weakness generalized 08/19/2013  . Lung nodule  seen on imaging study 08/18/2013  . Nausea and vomiting 08/18/2013  . Lower GI bleed 05/18/2013  . Aspiration pneumonia ([place]) 05/12/2013  . Hyperlipidemia 05/03/2013  . COPD exacerbation ([place]) 11/20/2012  . Gout 11/17/2012  . Hypocalcemia 11/17/2012  . Upper respiratory infection 12/17/2011  . Breast cancer of lower-inner quadrant of right female breast ([place]) 10/17/2011  . Obesity hypoventilation syndrome ([place]) 09/24/2011  . Peripheral edema 09/24/2011  . Anemia 06/01/2011  . 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  . Bronchitis 05/27/2008  . Obstructive sleep apnea 05/27/2008    Past  Surgical History:  Procedure Laterality Date  . ABDOMINAL HYSTERECTOMY  02/04/2005  . BREAST BIOPSY Right 01/2011  . BREAST LUMPECTOMY Right 01/2011  . HERNIA REPAIR    . TOTAL KNEE ARTHROPLASTY Bilateral 03/2010; 06/2011   left; right  . TRANSTHORACIC ECHOCARDIOGRAM  07/2008   EF, LV size is normal; RVSP normal; mod calcif of MV apparatus  . TUBAL LIGATION  1970's  . UMBILICAL HERNIA REPAIR  02/04/2005     OB History   None      Home Medications    Prior to Admission medications   Medication Sig Start Date End Date Taking? Authorizing Provider  apixaban (ELIQUIS) 5 MG TABS tablet TAKE 1 TABLET (5 MG TOTAL) BY MOUTH 2 (TWO) TIMES DAILY. 10/22/14  Yes Hilty, Nadean Corwin, MD  Calcium Carb-Cholecalciferol 500-200 MG-UNIT TABS Take 1 tablet by mouth 2 (two) times daily. Reported on 01/26/2016   Yes [provider]  diltiazem (CARDIZEM CD) 240 MG 24 hr capsule Take 240 mg by mouth every morning.  01/20/14  Yes [provider]  docusate sodium (COLACE) 100 MG capsule Take 100 mg by mouth daily as needed for mild constipation.   Yes [provider]  donepezil (ARICEPT) 10 MG tablet Take 10 mg by mouth at bedtime.   Yes [provider]  furosemide (LASIX) 20 MG tablet Take 20 mg by mouth daily.   Yes [provider]  Infant Care Products (BABY [facility] EX) Apply topically. Wash both eyes with diluted baby shampoo every 4 hours while awake   Yes [provider]  lactulose (CHRONULAC) 10 GM/15ML solution Take by mouth daily. Give 15 mL by mouth   Yes [provider]  metoCLOPramide (REGLAN) 5 MG tablet Take 5 mg by mouth 2 (two) times daily.   Yes [provider]  NYSTATIN powder Apply Nystatin powder under breast and elbow fold 3 times daily for 2 weeks 04/09/18  Yes [provider]  OXYGEN Inhale 2 L/min into the lungs continuous.    Yes [provider]  Propylene Glycol (SYSTANE COMPLETE OP) Place 1 drop into both  eyes 3 (three) times daily.   Yes [provider]  SYMBICORT 160-4.5 MCG/ACT inhaler Inhale 2 puffs into the lungs 2 (two) times daily. Rinse mouth well after use 09/04/15  Yes [provider]  venlafaxine XR (EFFEXOR-XR) 37.5 MG 24 hr capsule Take 37.5 mg by mouth daily with breakfast.   Yes [provider]  Vitamin D, Ergocalciferol, (DRISDOL) 50000 units CAPS capsule Take 50,000 Units by mouth every 28 (twenty-eight) days.   Yes [provider]  albuterol (PROVENTIL) (2.5 MG/3ML) 0.083% nebulizer solution Take by nebulization every 6 (six) hours as needed for wheezing or shortness of breath.    [provider]  ondansetron (ZOFRAN) 8 MG tablet Take 8 mg by mouth every 8 (eight) hours as needed for nausea or vomiting.    [provider]  Family History Family History  Problem Relation Age of Onset  . Diabetes Mother   . Colon cancer Father   . Diabetes Brother   . Diabetes Sister   . Lung cancer Brother   . Cervical cancer Sister   . Thrombocytopenia Neg Hx   . Bleeding Disorder Neg Hx     Social History Social History   Tobacco Use  . Smoking status: Never Smoker  . Smokeless tobacco: Never Used  Substance Use Topics  . Alcohol use: No  . Drug use: No     Allergies   Aspirin; Ibuprofen; Contrast media [iodinated diagnostic agents]; and Pravachol   Review of Systems Review of Systems  UNABLE TO OBTAIN ROS DUE TO LEVEL 5 CAVEAT   Physical Exam Updated Vital Signs BP 115/70 (BP Location: Right Arm)   Pulse 86   Temp 97.9 F (36.6 C) (Oral)   Resp (!) 25   SpO2 100%  Vitals reviewed Physical Exam  Physical Examination: General appearance - alert, well appearing, and in no distress Mental status - alert, oriented to person not to time or place Eyes - no conjunctival injection, no scleral icterus Mouth - mucous membranes moist, pharynx normal without lesions Neck - supple, no significant adenopathy Chest -  clear to auscultation, no wheezes, rales or rhonchi, symmetric air entry Heart - normal rate, regular rhythm, normal S1, S2, no murmurs, rubs, clicks or gallops Abdomen - soft, nontender, nondistended, no masses or organomegaly Rectal - soft brown stool, no red blood or melena Neurological - alert, oriented x 1, cranial nerves grossly intact, normal tone Extremities - peripheral pulses normal, no pedal edema, no clubbing or cyanosis Skin - normal coloration and turgor, diffuse purpura over arms and legs   ED Treatments / Results  Labs (all labs ordered are listed, but only abnormal results are displayed) Labs Reviewed  COMPREHENSIVE METABOLIC PANEL - Abnormal; Notable for the following components:      Result Value   Chloride 98 (*)    Glucose, Bld 177 (*)    BUN 23 (*)    Albumin 2.8 (*)    ALT 13 (*)    All other components within normal limits  CBC - Abnormal; Notable for the following components:   Hemoglobin 10.9 (*)    HCT 35.1 (*)    MCH 25.0 (*)    Platelets 6 (*)    All other components within normal limits  PROTIME-INR - Abnormal; Notable for the following components:   Prothrombin Time 17.8 (*)    All other components within normal limits  FIBRINOGEN - Abnormal; Notable for the following components:   Fibrinogen 616 (*)    All other components within normal limits  DIFFERENTIAL - Abnormal; Notable for the following components:   Neutro Abs 8.0 (*)    All other components within normal limits  POC OCCULT BLOOD, ED - Abnormal; Notable for the following components:   Fecal Occult Bld POSITIVE (*)    All other components within normal limits  APTT  OCCULT BLOOD X 1 CARD TO LAB, STOOL  CBC WITH DIFFERENTIAL/PLATELET  URINALYSIS, ROUTINE W REFLEX MICROSCOPIC  TYPE AND SCREEN  PREPARE PLATELET PHERESIS    EKG None  Radiology No results found.  Procedures Procedures (including critical care time)  Medications Ordered in ED Medications  0.9 %  sodium  chloride infusion (has no administration in time range)  dexamethasone (DECADRON) injection 20 mg (has no administration in time range)   CRITICAL CARE Performed by:  MABE, MARTHA L Total critical care time: 35 minutes Critical care time was exclusive of separately billable procedures and treating other patients due to rectal bleeding and thrombpcytopenic Critical care was necessary to treat or prevent imminent or life-threatening deterioration. Critical care was time spent personally by me on the following activities: development of treatment plan with patient and/or surrogate as well as nursing, discussions with consultants, evaluation of patient's response to treatment, examination of patient, obtaining history from patient or surrogate, ordering and performing treatments and interventions, ordering and review of laboratory studies, ordering and review of radiographic studies, pulse oximetry and re-evaluation of patient's condition.  Initial Impression / Assessment and Plan / ED Course  I have reviewed the triage vital signs and the nursing notes.  Pertinent labs & imaging results that were available during my care of the patient were reviewed by me and considered in my medical decision making (see chart for details).  7:15 PM  D/w Dr. Roel Cluck, hospitalist for admission.  She will see patient in the ED.  Platelets have been ordered.  Pt remains in stable condition.      Patient presenting with concern for thrombocytopenia from nursing home.  She was treated in the hospital for similar findings several weeks ago.  Platelets responded to transfusion and IVIG per chart review and she was restarted on her Eliquis.  Today in the ED she is asymptomatic except for purpura on her arms and legs which family state are in the process of improving from her prior hospitalization.  She has no active bleeding and stool is soft and brown on rectal exam.  Her platelets are 6000.  Her hemoglobin is stable at 11.1.   She has no abdominal tenderness on exam and her vital signs are reassuring.  Platelets were ordered for transfusion and discussed with hospitalist for admission for further management and treatment.  Final Clinical Impressions(s) / ED Diagnoses   Final diagnoses:  Thrombocytopenia [facility])    ED Discharge Orders    None       Mabe, Forbes Cellar, MD 04/09/18 2345    Pixie Casino, MD 04/25/18 [phone]

## 2018-04-09 NOTE — ED Notes (Signed)
Bed: LP37 Expected date:  Expected time:  Means of arrival:  Comments: 77 yo rectal bleed

## 2018-04-09 NOTE — ED Notes (Signed)
ED TO INPATIENT HANDOFF REPORT  Name/Age/Gender [name] 77 y.o. female  Code Status Code Status History    Date Active Date Inactive Code Status Order ID Comments User Context   03/18/2018 1739 03/23/2018 1615 Full Code [id number]  Janece Canterbury, MD ED   10/05/2015 2108 10/09/2015 2303 Full Code [id number]  Lavina Hamman, MD ED   04/17/2014 2008 04/23/2014 1707 Full Code [id number]  Elgergawy, Silver Huguenin, MD ED   01/24/2014 1818 01/30/2014 1618 Full Code [id number]  Brett Canales, PA-C Inpatient   11/01/2013 0103 11/07/2013 1849 Full Code [id number]  Louellen Molder, MD Inpatient   08/19/2013 0152 08/26/2013 2031 Full Code [id number]  Phillips Grout, MD Inpatient   05/03/2013 1751 05/14/2013 1910 Full Code [id number]  Rama, Venetia Maxon, MD Inpatient    Advance Directive Documentation     Most Recent Value  Type of Advance Directive  Healthcare Power of Attorney  Pre-existing out of facility DNR order (yellow form or pink MOST form)  -  "MOST" Form in Place?  -      Home/SNF/Other Nursing Home  Chief Complaint rectal bleeding, abnormal labs  Level of Care/Admitting Diagnosis ED Disposition    ED Disposition Condition [place]: [facility] [[id number]]  Level of Care: Stepdown [14]  Admit to SDU based on following criteria: Other see comments  Comments: severe thrombocytopenia with Gi bleed  Diagnosis: Thrombocytopenia ([place]) [924462]  Admitting Physician: Toy Baker [[id number]]  Attending Physician: Toy Baker [[id number]]  Estimated length of stay: 3 - 4 days  Certification:: I certify this patient will need inpatient services for at least 2 midnights  PT Class (Do Not Modify): Inpatient [101]  PT Acc Code (Do Not Modify): Private [1]       Medical History Past Medical History:  Diagnosis Date  . Anemia   . Asthma   . Atrial fibrillation, persistent ([place]) 11/01/2013  . Breast cancer, stage 1 ([place]) 10/17/2011   s/p right  lumpectomy and XRT  . Bronchitis   . Bruises easily   . Chills   . Chronic anticoagulation, on Eliquis 11/01/2013  . Chronic bronchitis ([place])   . Chronic diastolic CHF (congestive heart failure) ([place])   . Constipation   . COPD (chronic obstructive pulmonary disease) ([place])   . Cough   . Dementia without behavioral disturbance   . Dyslipidemia   . Fracture of left lower leg 06/1972   "no surgery; just casted it"  . Generalized weakness   . Gout attack    ankle, then wrist and hands  . Gout without tophus   . Hernia   . History of nuclear stress test 02/09/2010   dipyridamole; normal pattern of perfusion in all regions with attenuation artifact in inferior region, low risk   . Hypertension   . Insomnia   . Morbid obesity ([place])   . On home oxygen therapy    "2L prn" (01/24/2014)  . OSA on CPAP   . Pneumonia 2012; 2014  . Protein calorie malnutrition ([place])   . Sarcoid   . Sarcoidosis   . Type II diabetes mellitus ([place])   . Uterine cancer [facility])    s/p hysterectomy  . Wheezing     Allergies Allergies  Allergen Reactions  . Aspirin Other (See Comments)    Avoids due to being on blood thinners  . Ibuprofen Other (See Comments)    Avoids due to being on blood thinners  . Contrast Media [Iodinated  Diagnostic Agents] Nausea And Vomiting  . Pravachol Other (See Comments)    Muscle pain    IV Location/Drains/Wounds Patient Lines/Drains/Airways Status   Active Line/Drains/Airways    Name:   Placement date:   Placement time:   Site:   Days:   Peripheral IV 04/09/18 Right Forearm   04/09/18    1539    Forearm   less than 1   External Urinary Catheter   03/19/18    1800    -   21          Labs/Imaging Results for orders placed or performed during the hospital encounter of 04/09/18 (from the past 48 hour(s))  Comprehensive metabolic panel     Status: Abnormal   Collection Time: 04/09/18  4:04 PM  Result Value Ref Range   Sodium 139 135 - 145 mmol/L   Potassium 4.4 3.5 - 5.1  mmol/L   Chloride 98 (L) 101 - 111 mmol/L   CO2 30 22 - 32 mmol/L   Glucose, Bld 177 (H) 65 - 99 mg/dL   BUN 23 (H) 6 - 20 mg/dL   Creatinine, Ser 0.78 0.44 - 1.00 mg/dL   Calcium 9.4 8.9 - 10.3 mg/dL   Total Protein 6.8 6.5 - 8.1 g/dL   Albumin 2.8 (L) 3.5 - 5.0 g/dL   AST 19 15 - 41 U/L   ALT 13 (L) 14 - 54 U/L   Alkaline Phosphatase 55 38 - 126 U/L   Total Bilirubin 0.4 0.3 - 1.2 mg/dL   GFR calc non Af Amer >60 >60 mL/min   GFR calc Af Amer >60 >60 mL/min    Comment: (NOTE) The eGFR has been calculated using the CKD EPI equation. This calculation has not been validated in all clinical situations. eGFR's persistently <60 mL/min signify possible Chronic Kidney Disease.    Anion gap 11 5 - 15    Comment: Performed at [facility], [place] [address], [place], [place] [address]  CBC     Status: Abnormal   Collection Time: 04/09/18  4:04 PM  Result Value Ref Range   WBC 9.9 4.0 - 10.5 K/uL   RBC 4.36 3.87 - 5.11 MIL/uL   Hemoglobin 10.9 (L) 12.0 - 15.0 g/dL   HCT 35.1 (L) 36.0 - 46.0 %   MCV 80.5 78.0 - 100.0 fL   MCH 25.0 (L) 26.0 - 34.0 pg   MCHC 31.1 30.0 - 36.0 g/dL   RDW 15.3 11.5 - 15.5 %   Platelets 6 (LL) 150 - 400 K/uL    Comment: REPEATED TO VERIFY SPECIMEN CHECKED FOR CLOTS PLATELET COUNT CONFIRMED BY SMEAR CRITICAL RESULT CALLED TO, READ BACK BY AND VERIFIED WITH: Dolores Hoose [id number] @ 53 BY J SCOTTON Performed at [facility], [place] [address] [place], [place] [address]   Type and screen [place]     Status: None   Collection Time: 04/09/18  4:04 PM  Result Value Ref Range   ABO/RH(D) O NEG    Antibody Screen NEG    Sample Expiration      04/12/2018 Performed at [facility], [place] [address] [place], [place] [address]   Protime-INR     Status: Abnormal   Collection Time: 04/09/18  4:04 PM  Result Value Ref Range   Prothrombin Time 17.8 (H) 11.4 - 15.2 seconds   INR 1.49      Comment: Performed at [facility], [address] [place],  [place] [address]  Fibrinogen     Status: Abnormal   Collection Time: 04/09/18  4:04 PM  Result Value Ref Range   Fibrinogen 616 (H) 210 - 475 mg/dL    Comment: Performed at [facility], [place] [address] [place], [place] [address]  PTT     Status: None   Collection Time: 04/09/18  4:04 PM  Result Value Ref Range   aPTT 36 24 - 36 seconds    Comment: Performed at [facility], [place] [address] [place], [place] [address]  Differential     Status: Abnormal   Collection Time: 04/09/18  4:04 PM  Result Value Ref Range   Neutrophils Relative % 80 %   Neutro Abs 8.0 (H) 1.7 - 7.7 K/uL   Lymphocytes Relative 13 %   Lymphs Abs 1.3 0.7 - 4.0 K/uL   Monocytes Relative 6 %   Monocytes Absolute 0.6 0.1 - 1.0 K/uL   Eosinophils Relative 1 %   Eosinophils Absolute 0.1 0.0 - 0.7 K/uL   Basophils Relative 0 %   Basophils Absolute 0.0 0.0 - 0.1 K/uL    Comment: Performed at [facility], [place] [address] [place], [place] [address]  Prepare Pheresed Platelets     Status: None (Preliminary result)   Collection Time: 04/09/18  5:29 PM  Result Value Ref Range   Unit Number [id number]    Blood Component Type PLTPHER LR1    Unit division 00    Status of Unit ISSUED    Transfusion Status      OK TO TRANSFUSE Performed at [place] [address], [place], [place] [address]    Unit Number [id number]    Blood Component Type PLTPHER LR1    Unit division 00    Status of Unit ALLOCATED    Transfusion Status OK TO TRANSFUSE   POC occult blood, ED     Status: Abnormal   Collection Time: 04/09/18  6:44 PM  Result Value Ref Range   Fecal Occult Bld POSITIVE (A) NEGATIVE   No results found.  Pending Labs Unresulted Labs (From admission, onward)   Start     Ordered   04/09/18 1924  Urinalysis, Routine w reflex microscopic  Once,   R      04/09/18 1923   04/09/18 1922  CBC with Differential/Platelet  Add-on,   R     04/09/18 1921   04/09/18 1610  Occult blood card to lab, stool Provider will collect  Once,   STAT    Question:  Specimen to be collected by?  Answer:  Provider will collect   04/09/18 1609   Signed and Held  Magnesium  Tomorrow morning,   R    Comments:  Call MD if <1.5    Signed and Held   Signed and Held  Phosphorus  Tomorrow morning,   R     Signed and Held   Signed and Held  TSH  Once,   R    Comments:  Cancel if already done within 1 month and notify MD    Signed and Held   Signed and Held  Comprehensive metabolic panel  Once,   R    Comments:  Cal MD for K<3.5 or >5.0    Signed and Held   Signed and Held  CBC  Once,   R    Comments:  Call for hg <8.0    Signed and Held  Vitals/Pain Today's Vitals   04/09/18 2135 04/09/18 2136 04/09/18 2140 04/09/18 2145  BP: 117/78 117/78 119/67 119/70  Pulse: 80 81 86 62  Resp: (!) 24 (!) 23 (!) 22 20  Temp:      TempSrc:      SpO2: 100% 100% 99% 100%    Isolation Precautions No active isolations  Medications Medications  0.9 %  sodium chloride infusion (has no administration in time range)  dexamethasone (DECADRON) injection 20 mg (has no administration in time range)    Mobility non-ambulatory

## 2018-04-09 NOTE — H&P (Signed)
[name] OXB:[id number] DOB: [birth year] DOA: 04/09/2018     PCP: [name], DO   Outpatient Specialists:   CARDS:  Dr. Debara Pickett    Oncology  Dr. Lindi Adie Patient arrived to ER on 04/09/18 at 1512  Patient coming from:  From facility [facility]  Chief Complaint:  Chief Complaint  Patient presents with  . Rectal Bleeding  . abnormal lab value    low platelet count    HPI: [name] is a 77 y.o. female with medical history significant of breast CA, diabetes mellitus type 2, COPD, OSA, chronic diastolic heart failure, persistent atrial fibrillation on Eliquis, dementia, and diverticulosis, possible ITP  history of pulmonary hypertension    Presented with   Blood in depends yesterday rechecked CBC and noted plt 3 brought to ER.  Family noted diffuse rash synovitis persistent from prior patient's Eliquis has been restarted her last dose was today She has dementia at baseline per family currently appears to be at her baseline Last admittion for rectal bleeding 3 wks ago at that time she required transfusions of 2 packets of platelets to bleeding felt to be secondary to severe thrombocytopenia during that time her platelet count was 5 after receiving 2.  Units of platelets count improved to 221 and bleeding resolved differential included ITP versus bone marrow failure hematology consulted recommended IVIG and Decadron. (on IV dexamethasone, 20 mg daily for 4 days and IVIG at the rate of 1 g/kg daily IV for x2 days)   Regarding pertinent Chronic problems: History of cancer status post lumpectomy 2012 and radiation therapy treated with Aromasin   While in ER:  Noted to have platelets down to 6 globin is stable at 10.9  ER provider did not noticed bright red blood per rectum but stool is Hemoccult positive Following Medications were ordered in ER: Medications  0.9 %  sodium chloride infusion (has no administration in time range)    Significant initial  Findings: Abnormal  Labs Reviewed  COMPREHENSIVE METABOLIC PANEL - Abnormal; Notable for the following components:      Result Value   Chloride 98 (*)    Glucose, Bld 177 (*)    BUN 23 (*)    Albumin 2.8 (*)    ALT 13 (*)    All other components within normal limits  CBC - Abnormal; Notable for the following components:   Hemoglobin 10.9 (*)    HCT 35.1 (*)    MCH 25.0 (*)    Platelets 6 (*)    All other components within normal limits  PROTIME-INR - Abnormal; Notable for the following components:   Prothrombin Time 17.8 (*)    All other components within normal limits  FIBRINOGEN - Abnormal; Notable for the following components:   Fibrinogen 616 (*)    All other components within normal limits  POC OCCULT BLOOD, ED - Abnormal; Notable for the following components:   Fecal Occult Bld POSITIVE (*)    All other components within normal limits     Na  139 K 4.4  Cr stable,  Lab Results  Component Value Date   CREATININE 0.78 04/09/2018   CREATININE 0.75 03/23/2018   CREATININE 0.77 03/22/2018    Fibrinogen 616  WBC 9.9  HG/HCT  Stable     Component Value Date/Time   HGB 10.9 (L) 04/09/2018 1604   HGB 14.4 10/02/2014 1302   HCT 35.1 (L) 04/09/2018 1604   HCT 32.6 (L) 03/19/2018 0822   HCT 44.8  10/02/2014 1302     Troponin (Point of Care Test) No results for input(s): TROPIPOC in the last 72 hours.    Lactic Acid, Venous    Component Value Date/Time   LATICACIDVEN 1.3 10/05/2015 2320      UA ordered   CT HEAD * NON acute  CXR - *NON acute  CTabd/pelvis - *nonacute  ECG:  Not obtained     ED Triage Vitals  Enc Vitals Group     BP 04/09/18 1536 137/72     Pulse Rate 04/09/18 1536 67     Resp 04/09/18 1536 16     Temp --      Temp src --      SpO2 04/09/18 1526 97 %     Weight --      Height --      Head Circumference --      Peak Flow --      Pain Score --      Pain Loc --      Pain Edu? --      Excl. in [place]? --   TMAX(24)@       Latest  Blood pressure  121/73, pulse 74, resp. rate (!) 25, SpO2 100 %.    I Called:     Dr. Julien Nordmann They Recommend* Will see in AM*  in ER  Hospitalist was called for admission for thrombocytopenia severe rectal bleeding   Review of Systems:    Pertinent positives include:  fatigue, blood in stool,   Constitutional:  No weight loss, night sweats, Fevers, chills, weight loss  HEENT:  No headaches, Difficulty swallowing,Tooth/dental problems,Sore throat,  No sneezing, itching, ear ache, nasal congestion, post nasal drip,  Cardio-vascular:  No chest pain, Orthopnea, PND, anasarca, dizziness, palpitations.no Bilateral lower extremity swelling  GI:  No heartburn, indigestion, abdominal pain, nausea, vomiting, diarrhea, change in bowel habits, loss of appetite, melena,hematemesis Resp:  no shortness of breath at rest. No dyspnea on exertion, No excess mucus, no productive cough, No non-productive cough, No coughing up of blood.No change in color of mucus.No wheezing. Skin:  no rash or lesions. No jaundice GU:  no dysuria, change in color of urine, no urgency or frequency. No straining to urinate.  No flank pain.  Musculoskeletal:  No joint pain or no joint swelling. No decreased range of motion. No back pain.  Psych:  No change in mood or affect. No depression or anxiety. No memory loss.  Neuro: no localizing neurological complaints, no tingling, no weakness, no double vision, no gait abnormality, no slurred speech, no confusion  As per HPI otherwise 10 point review of systems negative.   Past Medical History:   Past Medical History:  Diagnosis Date  . Anemia   . Asthma   . Atrial fibrillation, persistent ([place]) 11/01/2013  . Breast cancer, stage 1 ([place]) 10/17/2011   s/p right lumpectomy and XRT  . Bronchitis   . Bruises easily   . Chills   . Chronic anticoagulation, on Eliquis 11/01/2013  . Chronic bronchitis ([place])   . Chronic diastolic CHF (congestive heart failure) ([place])   . Constipation     . COPD (chronic obstructive pulmonary disease) ([place])   . Cough   . Dementia without behavioral disturbance   . Dyslipidemia   . Fracture of left lower leg 06/1972   "no surgery; just casted it"  . Generalized weakness   . Gout attack    ankle, then wrist and hands  . Gout without tophus   .  Hernia   . History of nuclear stress test 02/09/2010   dipyridamole; normal pattern of perfusion in all regions with attenuation artifact in inferior region, low risk   . Hypertension   . Insomnia   . Morbid obesity ([place])   . On home oxygen therapy    "2L prn" (01/24/2014)  . OSA on CPAP   . Pneumonia 2012; 2014  . Protein calorie malnutrition ([place])   . Sarcoid   . Sarcoidosis   . Type II diabetes mellitus ([place])   . Uterine cancer [facility])    s/p hysterectomy  . Wheezing       Past Surgical History:  Procedure Laterality Date  . ABDOMINAL HYSTERECTOMY  02/04/2005  . BREAST BIOPSY Right 01/2011  . BREAST LUMPECTOMY Right 01/2011  . HERNIA REPAIR    . TOTAL KNEE ARTHROPLASTY Bilateral 03/2010; 06/2011   left; right  . TRANSTHORACIC ECHOCARDIOGRAM  07/2008   EF, LV size is normal; RVSP normal; mod calcif of MV apparatus  . TUBAL LIGATION  1970's  . UMBILICAL HERNIA REPAIR  02/04/2005    Social History:  Ambulatory  bed bound     reports that she has never smoked. She has never used smokeless tobacco. She reports that she does not drink alcohol or use drugs.     Family History:   Family History  Problem Relation Age of Onset  . Diabetes Mother   . Colon cancer Father   . Diabetes Brother   . Diabetes Sister   . Lung cancer Brother   . Cervical cancer Sister   . Thrombocytopenia Neg Hx   . Bleeding Disorder Neg Hx     Allergies: Allergies  Allergen Reactions  . Aspirin Other (See Comments)    Avoids due to being on blood thinners  . Ibuprofen Other (See Comments)    Avoids due to being on blood thinners  . Contrast Media [Iodinated Diagnostic Agents] Nausea And Vomiting  .  Pravachol Other (See Comments)    Muscle pain     Prior to Admission medications   Medication Sig Start Date End Date Taking? Authorizing Provider  apixaban (ELIQUIS) 5 MG TABS tablet TAKE 1 TABLET (5 MG TOTAL) BY MOUTH 2 (TWO) TIMES DAILY. 10/22/14  Yes Hilty, Nadean Corwin, MD  Calcium Carb-Cholecalciferol 500-200 MG-UNIT TABS Take 1 tablet by mouth 2 (two) times daily. Reported on 01/26/2016   Yes [provider]  diltiazem (CARDIZEM CD) 240 MG 24 hr capsule Take 240 mg by mouth every morning.  01/20/14  Yes [provider]  docusate sodium (COLACE) 100 MG capsule Take 100 mg by mouth daily as needed for mild constipation.   Yes [provider]  donepezil (ARICEPT) 10 MG tablet Take 10 mg by mouth at bedtime.   Yes [provider]  furosemide (LASIX) 20 MG tablet Take 20 mg by mouth daily.   Yes [provider]  Infant Care Products (BABY [facility] EX) Apply topically. Wash both eyes with diluted baby shampoo every 4 hours while awake   Yes [provider]  lactulose (CHRONULAC) 10 GM/15ML solution Take by mouth daily. Give 15 mL by mouth   Yes [provider]  metoCLOPramide (REGLAN) 5 MG tablet Take 5 mg by mouth 2 (two) times daily.   Yes [provider]  NYSTATIN powder Apply Nystatin powder under breast and elbow fold 3 times daily for 2 weeks 04/09/18  Yes [provider]  OXYGEN Inhale 2 L/min into the lungs continuous.  Yes [provider]  Propylene Glycol (SYSTANE COMPLETE OP) Place 1 drop into both eyes 3 (three) times daily.   Yes [provider]  SYMBICORT 160-4.5 MCG/ACT inhaler Inhale 2 puffs into the lungs 2 (two) times daily. Rinse mouth well after use 09/04/15  Yes [provider]  venlafaxine XR (EFFEXOR-XR) 37.5 MG 24 hr capsule Take 37.5 mg by mouth daily with breakfast.   Yes [provider]  Vitamin D, Ergocalciferol, (DRISDOL) 50000 units CAPS capsule Take 50,000  Units by mouth every 28 (twenty-eight) days.   Yes [provider]  albuterol (PROVENTIL) (2.5 MG/3ML) 0.083% nebulizer solution Take by nebulization every 6 (six) hours as needed for wheezing or shortness of breath.    [provider]  ondansetron (ZOFRAN) 8 MG tablet Take 8 mg by mouth every 8 (eight) hours as needed for nausea or vomiting.    [provider]   Physical Exam: Blood pressure 121/73, pulse 74, resp. rate (!) 25, SpO2 100 %. 1. General:  in No Acute distress   Chronically ill  -appearing 2. Psychological: Somnolent not oriented 3. Head/ENT:     Dry Mucous Membranes                          Head Non traumatic, neck supple                           Poor Dentition 4. SKIN:   decreased Skin turgor,  Skin clean Dry and intact no rash, petichia noted 5. Heart: Regular rate and rhythm no Murmur, no Rub or gallop 6. Lungs:  Clear to auscultation bilaterally, no wheezes or crackles   7. Abdomen: Soft,  non-tender, Non distended  Obese bowel sounds present 8. Lower extremities: no clubbing, cyanosis, or edema 9. Neurologically Grossly intact, moving all 4 extremities equally  10. MSK: Normal range of motion   LABS:     Recent Labs  Lab 04/09/18 1604  WBC 9.9  HGB 10.9*  HCT 35.1*  MCV 80.5  PLT 6*   Basic Metabolic Panel: Recent Labs  Lab 04/09/18 1604  NA 139  K 4.4  CL 98*  CO2 30  GLUCOSE 177*  BUN 23*  CREATININE 0.78  CALCIUM 9.4      Recent Labs  Lab 04/09/18 1604  AST 19  ALT 13*  ALKPHOS 55  BILITOT 0.4  PROT 6.8  ALBUMIN 2.8*   No results for input(s): LIPASE, AMYLASE in the last 168 hours. No results for input(s): AMMONIA in the last 168 hours.    HbA1C: No results for input(s): HGBA1C in the last 72 hours. CBG: No results for input(s): GLUCAP in the last 168 hours.    Urine analysis:    Component Value Date/Time   COLORURINE YELLOW 10/06/2015 1210   APPEARANCEUR CLEAR 10/06/2015 1210   LABSPEC 1.014  10/06/2015 1210   PHURINE 6.0 10/06/2015 1210   GLUCOSEU NEGATIVE 10/06/2015 1210   HGBUR NEGATIVE 10/06/2015 1210   BILIRUBINUR NEGATIVE 10/06/2015 1210   KETONESUR NEGATIVE 10/06/2015 1210   PROTEINUR NEGATIVE 10/06/2015 1210   UROBILINOGEN 1.0 10/06/2015 1210   NITRITE NEGATIVE 10/06/2015 1210   LEUKOCYTESUR SMALL (A) 10/06/2015 1210      Cultures:    Component Value Date/Time   SDES URINE, RANDOM 10/07/2015 0833   SPECREQUEST NONE 10/07/2015 0833   CULT NO GROWTH 1 DAY 10/07/2015 0833   REPTSTATUS 10/08/2015 FINAL 10/07/2015  [place] on Admission: No results found.  Chart has been reviewed    Assessment/Plan   77 y.o. female with medical history significant of breast CA, diabetes mellitus type 2, COPD, OSA, chronic diastolic heart failure, persistent atrial fibrillation on Eliquis, dementia, and diverticulosis, possible ITP  history of pulmonary hypertension  Admitted for recurrent thrombocytopenia and rectal bleeding  Present on Admission: . Acute ITP (HCC) recurrent discussed with oncology for now restart on steroids defer to oncology in the morning regarding other options . Anemia hemoglobin currently stable continue to monitor . Breast cancer of lower-inner quadrant of right female breast ([place]) currently stable in remission . Chronic atrial fibrillation (HCC) -           - CHA2DS2 vas score 6 : hold anticoagulation secondary to   recurrent bleeding         -  Rate control:  Currently controlled with   Diltiazem  will continue    . Chronic diastolic CHF (congestive heart failure) (HCC) currently appears to be slightly on a dry side continue to monitor avoid fluid overload . CKD (chronic kidney disease) stage 2, GFR 60-89 ml/min currently stable avoid nephrotoxic medications . COPD (chronic obstructive pulmonary disease) (HCC) chronic stable continue home medications . Diabetes mellitus type 2 in obese (HCC) order sliding scale and monitor .  Dementia without behavioral disturbance -monitor for any evidence of sundowning . Diverticular hemorrhage -spontaneous in the setting of thrombocytopenia in the past was resolved once thrombocytopenia has improved . Hyperlipidemia - stable continue home medications . Hypertension continue home medications with holding parameters . Lower GI bleed in the setting of thrombocytopenia will need to correct thrombocytopenia first in the past have resolved after his been corrected if it does not would benefit from GI consult   . Thrombocytopenia ([place]) in the setting of ITP given acute bleeding and platelet count below 10 we will transfuse 2 units  Other plan as per orders.  DVT prophylaxis:  SCD   Code Status:   DNR/DNI   as per  family  I had personally discussed CODE STATUS with patient and family  I had spent 74min discussing goals of care and CODE STATUS  Family Communication:   Family  at  Bedside  plan of care was discussed with  Sons,   Disposition Plan:                             Back to current facility when stable                                Consults called: oncology   Admission status:   inpatient     Level of care     SDU      Maikol Grassia 04/10/2018, 1:10 AM    [facility]  Pager [phone]   after 2 AM please page floor coverage PA If 7AM-7PM, please contact the day team taking care of the patient  [email or web address]  Password [email or web address]

## 2018-04-09 NOTE — ED Triage Notes (Signed)
Patient arrived via [facility] from [facility] with rectal bleeding. Patient had blood work done today at nursing facility where they discovered that she has an abnormally low platelet count. Pt takes eloquis for a-fib daily. She has had a dose today.   A/O to self and situation at baseline.

## 2018-04-09 NOTE — ED Notes (Signed)
Date and time results received: 04/09/18 17:24   Test: Platelet  Critical Value: 6.0   Name of Provider Notified: Dr. Marcha Dutton via telephone and primary provider via radio system.   Orders Received? Or Actions Taken?: Will continue to monitor and await new orders.

## 2018-04-10 ENCOUNTER — Other Ambulatory Visit: Payer: Self-pay

## 2018-04-10 DIAGNOSIS — D693 Immune thrombocytopenic purpura: Secondary | ICD-10-CM

## 2018-04-10 LAB — URINALYSIS, ROUTINE W REFLEX MICROSCOPIC
BILIRUBIN URINE: NEGATIVE
GLUCOSE, UA: NEGATIVE mg/dL
KETONES UR: NEGATIVE mg/dL
NITRITE: POSITIVE — AB
Protein, ur: 100 mg/dL — AB
RBC / HPF: >50 RBC/hpf — ABNORMAL HIGH (ref 0–5)
SPECIFIC GRAVITY, URINE: 1.014 (ref 1.005–1.030)
WBC, UA: >50 WBC/hpf — ABNORMAL HIGH (ref 0–5)
pH: 5 (ref 5.0–8.0)

## 2018-04-10 LAB — CBC
HEMATOCRIT: 32.1 % — AB (ref 36.0–46.0)
HEMATOCRIT: 33.6 % — AB (ref 36.0–46.0)
HEMOGLOBIN: 10.1 g/dL — AB (ref 12.0–15.0)
HEMOGLOBIN: 10.7 g/dL — AB (ref 12.0–15.0)
MCH: 25.1 pg — AB (ref 26.0–34.0)
MCH: 25.4 pg — ABNORMAL LOW (ref 26.0–34.0)
MCHC: 31.5 g/dL (ref 30.0–36.0)
MCHC: 31.8 g/dL (ref 30.0–36.0)
MCV: 79.7 fL (ref 78.0–100.0)
MCV: 79.8 fL (ref 78.0–100.0)
PLATELETS: 15 10*3/uL — AB (ref 150–400)
Platelets: 16 10*3/uL — CL (ref 150–400)
RBC: 4.03 MIL/uL (ref 3.87–5.11)
RBC: 4.21 MIL/uL (ref 3.87–5.11)
RDW: 15.6 % — ABNORMAL HIGH (ref 11.5–15.5)
RDW: 15.2 % (ref 11.5–15.5)
WBC: 7.5 10*3/uL (ref 4.0–10.5)
WBC: 6.6 10*3/uL (ref 4.0–10.5)

## 2018-04-10 LAB — COMPREHENSIVE METABOLIC PANEL
ALK PHOS: 55 U/L (ref 38–126)
ALK PHOS: 53 U/L (ref 38–126)
ALT: 13 U/L — ABNORMAL LOW (ref 14–54)
ALT: 17 U/L (ref 14–54)
ANION GAP: 10 (ref 5–15)
AST: 16 U/L (ref 15–41)
AST: 18 U/L (ref 15–41)
Albumin: 2.8 g/dL — ABNORMAL LOW (ref 3.5–5.0)
Albumin: 3.1 g/dL — ABNORMAL LOW (ref 3.5–5.0)
Anion gap: 10 (ref 5–15)
BILIRUBIN TOTAL: 0.7 mg/dL (ref 0.3–1.2)
BILIRUBIN TOTAL: 0.7 mg/dL (ref 0.3–1.2)
BUN: 20 mg/dL (ref 6–20)
BUN: 25 mg/dL — ABNORMAL HIGH (ref 6–20)
CALCIUM: 9.4 mg/dL (ref 8.9–10.3)
CALCIUM: 9.3 mg/dL (ref 8.9–10.3)
CO2: 32 mmol/L (ref 22–32)
CO2: 30 mmol/L (ref 22–32)
Chloride: 99 mmol/L — ABNORMAL LOW (ref 101–111)
Chloride: 100 mmol/L — ABNORMAL LOW (ref 101–111)
Creatinine, Ser: 0.69 mg/dL (ref 0.44–1.00)
Creatinine, Ser: 0.7 mg/dL (ref 0.44–1.00)
GFR calc Af Amer: >60 mL/min (ref >60)
Glucose, Bld: 135 mg/dL — ABNORMAL HIGH (ref 65–99)
Glucose, Bld: 185 mg/dL — ABNORMAL HIGH (ref 65–99)
POTASSIUM: 4.4 mmol/L (ref 3.5–5.1)
Potassium: 4 mmol/L (ref 3.5–5.1)
Sodium: 141 mmol/L (ref 135–145)
Sodium: 140 mmol/L (ref 135–145)
TOTAL PROTEIN: 6.8 g/dL (ref 6.5–8.1)
TOTAL PROTEIN: 7.1 g/dL (ref 6.5–8.1)

## 2018-04-10 LAB — BPAM PLATELET PHERESIS
BLOOD PRODUCT EXPIRATION DATE: 201905012359
Blood Product Expiration Date: 201905012359
ISSUE DATE / TIME: 201904292023
ISSUE DATE / TIME: 201904292327
Unit Type and Rh: 600
Unit Type and Rh: 6200

## 2018-04-10 LAB — PREPARE PLATELET PHERESIS
UNIT DIVISION: 0
Unit division: 0

## 2018-04-10 LAB — GLUCOSE, CAPILLARY: GLUCOSE-CAPILLARY: 181 mg/dL — AB (ref 65–99)

## 2018-04-10 LAB — TSH: TSH: 1.622 u[IU]/mL (ref 0.350–4.500)

## 2018-04-10 LAB — MAGNESIUM: MAGNESIUM: 1.6 mg/dL — AB (ref 1.7–2.4)

## 2018-04-10 LAB — PHOSPHORUS: PHOSPHORUS: 4.1 mg/dL (ref 2.5–4.6)

## 2018-04-10 MED ORDER — ACETAMINOPHEN 325 MG PO TABS
650.0000 mg | ORAL_TABLET | ORAL | Status: DC
  Administered 2018-04-11 – 2018-04-12 (×2): 650 mg via ORAL
  Filled 2018-04-10 (×3): qty 2

## 2018-04-10 MED ORDER — MOMETASONE FURO-FORMOTEROL FUM 200-5 MCG/ACT IN AERO
2.0000 | INHALATION_SPRAY | Freq: Two times a day (BID) | RESPIRATORY_TRACT | Status: DC
  Administered 2018-04-10 – 2018-04-17 (×14): 2 via RESPIRATORY_TRACT
  Filled 2018-04-10: qty 8.8

## 2018-04-10 MED ORDER — DIPHENHYDRAMINE HCL 25 MG PO CAPS
25.0000 mg | ORAL_CAPSULE | ORAL | Status: DC
  Administered 2018-04-11 – 2018-04-12 (×2): 25 mg via ORAL
  Filled 2018-04-10 (×3): qty 1

## 2018-04-10 MED ORDER — HYDROCODONE-ACETAMINOPHEN 5-325 MG PO TABS: 1.0000 | ORAL_TABLET | ORAL | Status: DC | PRN

## 2018-04-10 MED ORDER — DILTIAZEM HCL ER COATED BEADS 120 MG PO CP24
240.0000 mg | ORAL_CAPSULE | Freq: Every morning | ORAL | Status: DC
  Administered 2018-04-11: 240 mg via ORAL
  Filled 2018-04-10: qty 2

## 2018-04-10 MED ORDER — ALBUTEROL SULFATE (2.5 MG/3ML) 0.083% IN NEBU: 2.5000 mg | INHALATION_SOLUTION | RESPIRATORY_TRACT | Status: DC | PRN

## 2018-04-10 MED ORDER — VENLAFAXINE HCL ER 37.5 MG PO CP24
37.5000 mg | ORAL_CAPSULE | Freq: Every day | ORAL | Status: DC
  Administered 2018-04-12 – 2018-04-17 (×6): 37.5 mg via ORAL
  Filled 2018-04-10 (×8): qty 1

## 2018-04-10 MED ORDER — DIPHENHYDRAMINE HCL 25 MG PO CAPS
25.0000 mg | ORAL_CAPSULE | Freq: Once | ORAL | Status: AC
  Administered 2018-04-10: 25 mg via ORAL
  Filled 2018-04-10: qty 1

## 2018-04-10 MED ORDER — METOCLOPRAMIDE HCL 5 MG PO TABS
5.0000 mg | ORAL_TABLET | Freq: Two times a day (BID) | ORAL | Status: DC
  Administered 2018-04-10 – 2018-04-17 (×14): 5 mg via ORAL
  Filled 2018-04-10 (×14): qty 1

## 2018-04-10 MED ORDER — SODIUM CHLORIDE 0.9 % IV SOLN
Freq: Once | INTRAVENOUS | Status: AC
  Administered 2018-04-11: 01:00:00 via INTRAVENOUS

## 2018-04-10 MED ORDER — INSULIN ASPART 100 UNIT/ML ~~LOC~~ SOLN
0.0000 [IU] | Freq: Every day | SUBCUTANEOUS | Status: DC
  Administered 2018-04-11: 4 [IU] via SUBCUTANEOUS
  Administered 2018-04-12: 3 [IU] via SUBCUTANEOUS
  Administered 2018-04-13 – 2018-04-16 (×3): 2 [IU] via SUBCUTANEOUS

## 2018-04-10 MED ORDER — ONDANSETRON HCL 4 MG PO TABS: 4.0000 mg | ORAL_TABLET | Freq: Four times a day (QID) | ORAL | Status: DC | PRN

## 2018-04-10 MED ORDER — INSULIN ASPART 100 UNIT/ML ~~LOC~~ SOLN
0.0000 [IU] | Freq: Three times a day (TID) | SUBCUTANEOUS | Status: DC
  Administered 2018-04-11 (×2): 3 [IU] via SUBCUTANEOUS
  Administered 2018-04-11: 2 [IU] via SUBCUTANEOUS
  Administered 2018-04-12 (×2): 9 [IU] via SUBCUTANEOUS
  Administered 2018-04-12 – 2018-04-13 (×2): 5 [IU] via SUBCUTANEOUS
  Administered 2018-04-13 (×2): 3 [IU] via SUBCUTANEOUS
  Administered 2018-04-14: 2 [IU] via SUBCUTANEOUS
  Administered 2018-04-14 – 2018-04-15 (×3): 3 [IU] via SUBCUTANEOUS
  Administered 2018-04-15: 5 [IU] via SUBCUTANEOUS
  Administered 2018-04-15: 3 [IU] via SUBCUTANEOUS
  Administered 2018-04-16 (×3): 5 [IU] via SUBCUTANEOUS
  Administered 2018-04-17: 9 [IU] via SUBCUTANEOUS
  Administered 2018-04-17: 5 [IU] via SUBCUTANEOUS

## 2018-04-10 MED ORDER — DONEPEZIL HCL 10 MG PO TABS
10.0000 mg | ORAL_TABLET | Freq: Every day | ORAL | Status: DC
  Administered 2018-04-10 – 2018-04-16 (×7): 10 mg via ORAL
  Filled 2018-04-10 (×7): qty 1

## 2018-04-10 MED ORDER — SODIUM CHLORIDE 0.9 % IV SOLN
INTRAVENOUS | Status: AC
  Administered 2018-04-10 (×2): via INTRAVENOUS

## 2018-04-10 MED ORDER — ACETAMINOPHEN 650 MG RE SUPP: 650.0000 mg | Freq: Four times a day (QID) | RECTAL | Status: DC | PRN

## 2018-04-10 MED ORDER — SODIUM CHLORIDE 0.9 % IV SOLN
Freq: Once | INTRAVENOUS | Status: AC
  Administered 2018-04-10: 21:00:00 via INTRAVENOUS

## 2018-04-10 MED ORDER — ACETAMINOPHEN 325 MG PO TABS: 650.0000 mg | ORAL_TABLET | Freq: Four times a day (QID) | ORAL | Status: DC | PRN

## 2018-04-10 MED ORDER — ONDANSETRON HCL 4 MG/2ML IJ SOLN: 4.0000 mg | Freq: Four times a day (QID) | INTRAMUSCULAR | Status: DC | PRN

## 2018-04-10 MED ORDER — IMMUNE GLOBULIN (HUMAN) 10 GM/100ML IV SOLN
90.0000 g | INTRAVENOUS | Status: AC
  Administered 2018-04-11 – 2018-04-12 (×2): 90 g via INTRAVENOUS
  Filled 2018-04-10: qty 800
  Filled 2018-04-10: qty 900
  Filled 2018-04-10: qty 100

## 2018-04-10 MED ORDER — ACETAMINOPHEN 325 MG PO TABS
650.0000 mg | ORAL_TABLET | Freq: Once | ORAL | Status: AC
  Administered 2018-04-10: 650 mg via ORAL
  Filled 2018-04-10: qty 2

## 2018-04-10 NOTE — ED Notes (Signed)
ED TO INPATIENT HANDOFF REPORT  Name/Age/Gender [name] 77 y.o. female  Code Status Code Status History    Date Active Date Inactive Code Status Order ID Comments User Context   03/18/2018 1739 03/23/2018 1615 Full Code [id number]  Janece Canterbury, MD ED   10/05/2015 2108 10/09/2015 2303 Full Code [id number]  Lavina Hamman, MD ED   04/17/2014 2008 04/23/2014 1707 Full Code [id number]  Elgergawy, Silver Huguenin, MD ED   01/24/2014 1818 01/30/2014 1618 Full Code [id number]  Brett Canales, PA-C Inpatient   11/01/2013 0103 11/07/2013 1849 Full Code [id number]  Louellen Molder, MD Inpatient   08/19/2013 0152 08/26/2013 2031 Full Code [id number]  Phillips Grout, MD Inpatient   05/03/2013 1751 05/14/2013 1910 Full Code [id number]  Rama, Venetia Maxon, MD Inpatient    Advance Directive Documentation     Most Recent Value  Type of Advance Directive  Healthcare Power of Attorney  Pre-existing out of facility DNR order (yellow form or pink MOST form)  -  "MOST" Form in Place?  -      Home/SNF/Other SNF  Chief Complaint rectal bleeding, abnormal labs  Level of Care/Admitting Diagnosis ED Disposition    ED Disposition Condition [place]: [facility] [100102]  Level of Care: Stepdown [14]  Admit to SDU based on following criteria: Other see comments  Comments: severe thrombocytopenia with Gi bleed  Diagnosis: Thrombocytopenia ([place]) [283662]  Admitting Physician: Toy Baker [[id number]]  Attending Physician: Toy Baker [[id number]]  Estimated length of stay: 3 - 4 days  Certification:: I certify this patient will need inpatient services for at least 2 midnights  PT Class (Do Not Modify): Inpatient [101]  PT Acc Code (Do Not Modify): Private [1]       Medical History Past Medical History:  Diagnosis Date  . Anemia   . Asthma   . Atrial fibrillation, persistent ([place]) 11/01/2013  . Breast cancer, stage 1 ([place]) 10/17/2011   s/p right lumpectomy and XRT   . Bronchitis   . Bruises easily   . Chills   . Chronic anticoagulation, on Eliquis 11/01/2013  . Chronic bronchitis ([place])   . Chronic diastolic CHF (congestive heart failure) ([place])   . Constipation   . COPD (chronic obstructive pulmonary disease) ([place])   . Cough   . Dementia without behavioral disturbance   . Dyslipidemia   . Fracture of left lower leg 06/1972   "no surgery; just casted it"  . Generalized weakness   . Gout attack    ankle, then wrist and hands  . Gout without tophus   . Hernia   . History of nuclear stress test 02/09/2010   dipyridamole; normal pattern of perfusion in all regions with attenuation artifact in inferior region, low risk   . Hypertension   . Insomnia   . Morbid obesity ([place])   . On home oxygen therapy    "2L prn" (01/24/2014)  . OSA on CPAP   . Pneumonia 2012; 2014  . Protein calorie malnutrition ([place])   . Sarcoid   . Sarcoidosis   . Type II diabetes mellitus ([place])   . Uterine cancer [facility])    s/p hysterectomy  . Wheezing     Allergies Allergies  Allergen Reactions  . Aspirin Other (See Comments)    Avoids due to being on blood thinners  . Ibuprofen Other (See Comments)    Avoids due to being on blood thinners  . Contrast Media [[employer]] Nausea And Vomiting  . Pravachol Other (See Comments)    Muscle pain    IV Location/Drains/Wounds Patient Lines/Drains/Airways Status   Active Line/Drains/Airways    Name:   Placement date:   Placement time:   Site:   Days:   Peripheral IV 04/09/18 Right Forearm   04/09/18    1539    Forearm   1   External Urinary Catheter   03/19/18    1800    -   22          Labs/Imaging Results for orders placed or performed during the hospital encounter of 04/09/18 (from the past 48 hour(s))  Comprehensive metabolic panel     Status: Abnormal   Collection Time: 04/09/18  4:04 PM  Result Value Ref Range   Sodium 139 135 - 145 mmol/L   Potassium 4.4 3.5 - 5.1 mmol/L   Chloride 98 (L)  101 - 111 mmol/L   CO2 30 22 - 32 mmol/L   Glucose, Bld 177 (H) 65 - 99 mg/dL   BUN 23 (H) 6 - 20 mg/dL   Creatinine, Ser 0.78 0.44 - 1.00 mg/dL   Calcium 9.4 8.9 - 10.3 mg/dL   Total Protein 6.8 6.5 - 8.1 g/dL   Albumin 2.8 (L) 3.5 - 5.0 g/dL   AST 19 15 - 41 U/L   ALT 13 (L) 14 - 54 U/L   Alkaline Phosphatase 55 38 - 126 U/L   Total Bilirubin 0.4 0.3 - 1.2 mg/dL   GFR calc non Af Amer >60 >60 mL/min   GFR calc Af Amer >60 >60 mL/min    Comment: (NOTE) The eGFR has been calculated using the CKD EPI equation. This calculation has not been validated in all clinical situations. eGFR's persistently <60 mL/min signify possible Chronic Kidney Disease.    Anion gap 11 5 - 15    Comment: Performed at [facility], [place] [address], [place], [place] [address]  CBC     Status: Abnormal   Collection Time: 04/09/18  4:04 PM  Result Value Ref Range   WBC 9.9 4.0 - 10.5 K/uL   RBC 4.36 3.87 - 5.11 MIL/uL   Hemoglobin 10.9 (L) 12.0 - 15.0 g/dL   HCT 35.1 (L) 36.0 - 46.0 %   MCV 80.5 78.0 - 100.0 fL   MCH 25.0 (L) 26.0 - 34.0 pg   MCHC 31.1 30.0 - 36.0 g/dL   RDW 15.3 11.5 - 15.5 %   Platelets 6 (LL) 150 - 400 K/uL    Comment: REPEATED TO VERIFY SPECIMEN CHECKED FOR CLOTS PLATELET COUNT CONFIRMED BY SMEAR CRITICAL RESULT CALLED TO, READ BACK BY AND VERIFIED WITH: Dolores Hoose [id number] @ 73 BY J SCOTTON Performed at [facility], [place] [address], [place], [place] [address]   Type and screen [place]     Status: None   Collection Time: 04/09/18  4:04 PM  Result Value Ref Range   ABO/RH(D) O NEG    Antibody Screen NEG    Sample Expiration      04/12/2018 Performed at [facility], [place] [address] [place], [place] [address]   Protime-INR     Status: Abnormal   Collection Time: 04/09/18  4:04 PM  Result Value Ref Range   Prothrombin Time 17.8 (H) 11.4 - 15.2 seconds   INR 1.49     Comment: Performed at [facility], [place] [address], [place], [place] [address]  Fibrinogen     Status: Abnormal   Collection Time: 04/09/18  4:04 PM  Result Value Ref Range   Fibrinogen 616 (H) 210 - 475 mg/dL    Comment: Performed at [facility], [place] [address], [place], [place] [address]  PTT     Status: None   Collection Time: 04/09/18  4:04 PM  Result Value Ref Range   aPTT 36 24 - 36 seconds    Comment: Performed at [facility], [place] [address], [place], [place] [address]  Differential     Status: Abnormal   Collection Time: 04/09/18  4:04 PM  Result Value Ref Range   Neutrophils Relative % 80 %   Neutro Abs 8.0 (H) 1.7 - 7.7 K/uL   Lymphocytes Relative 13 %   Lymphs Abs 1.3 0.7 - 4.0 K/uL   Monocytes Relative 6 %   Monocytes Absolute 0.6 0.1 - 1.0 K/uL   Eosinophils Relative 1 %   Eosinophils Absolute 0.1 0.0 - 0.7 K/uL   Basophils Relative 0 %   Basophils Absolute 0.0 0.0 - 0.1 K/uL    Comment: Performed at [facility], [place] [address], [place], [place] [address]  Prepare Pheresed Platelets     Status: None   Collection Time: 04/09/18  5:29 PM  Result Value Ref Range   Unit Number [id number]    Blood Component Type PLTPHER LR1    Unit division 00    Status of Unit ISSUED,FINAL    Transfusion Status      OK TO TRANSFUSE Performed at [place] [address] [place], [place] [address]    Unit Number [id number]    Blood Component Type PLTPHER LR1    Unit division 00    Status of Unit ISSUED,FINAL    Transfusion Status OK TO TRANSFUSE   POC occult blood, ED     Status: Abnormal   Collection Time: 04/09/18  6:44 PM  Result Value Ref Range   Fecal Occult Bld POSITIVE (A) NEGATIVE  Urinalysis, Routine w reflex microscopic     Status: Abnormal   Collection Time: 04/10/18  3:31 AM  Result Value Ref Range   Color, Urine YELLOW YELLOW   APPearance TURBID (A) CLEAR   Specific Gravity, Urine  1.014 1.005 - 1.030   pH 5.0 5.0 - 8.0   Glucose, UA NEGATIVE NEGATIVE mg/dL   Hgb urine dipstick LARGE (A) NEGATIVE   Bilirubin Urine NEGATIVE NEGATIVE   Ketones, ur NEGATIVE NEGATIVE mg/dL   Protein, ur 100 (A) NEGATIVE mg/dL   Nitrite POSITIVE (A) NEGATIVE   Leukocytes, UA LARGE (A) NEGATIVE   RBC / HPF >50 (H) 0 - 5 RBC/hpf   WBC, UA >50 (H) 0 - 5 WBC/hpf   Bacteria, UA RARE (A) NONE SEEN   WBC Clumps PRESENT     Comment: Performed at [facility], [place] [address] [place] [address], [place], [place] [address]  CBC     Status: Abnormal   Collection Time: 04/10/18  5:48 AM  Result Value Ref Range   WBC 7.5 4.0 - 10.5 K/uL   RBC 4.03 3.87 - 5.11 MIL/uL   Hemoglobin 10.1 (L) 12.0 - 15.0 g/dL   HCT 32.1 (L) 36.0 - 46.0 %   MCV 79.7 78.0 - 100.0 fL   MCH 25.1 (L) 26.0 - 34.0 pg   MCHC 31.5 30.0 - 36.0 g/dL   RDW 15.6 (H) 11.5 - 15.5 %   Platelets 15 (LL) 150 -  400 K/uL    Comment: CRITICAL VALUE NOTED.  VALUE IS CONSISTENT WITH PREVIOUSLY REPORTED AND CALLED VALUE. Performed at [facility], [place] [address], [place], [place] [address]   Comprehensive metabolic panel     Status: Abnormal   Collection Time: 04/10/18  5:48 AM  Result Value Ref Range   Sodium 141 135 - 145 mmol/L   Potassium 4.0 3.5 - 5.1 mmol/L   Chloride 99 (L) 101 - 111 mmol/L   CO2 32 22 - 32 mmol/L   Glucose, Bld 135 (H) 65 - 99 mg/dL   BUN 20 6 - 20 mg/dL   Creatinine, Ser 0.69 0.44 - 1.00 mg/dL   Calcium 9.4 8.9 - 10.3 mg/dL   Total Protein 6.8 6.5 - 8.1 g/dL   Albumin 2.8 (L) 3.5 - 5.0 g/dL   AST 16 15 - 41 U/L   ALT 13 (L) 14 - 54 U/L   Alkaline Phosphatase 55 38 - 126 U/L   Total Bilirubin 0.7 0.3 - 1.2 mg/dL   GFR calc non Af Amer >60 >60 mL/min   GFR calc Af Amer >60 >60 mL/min    Comment: (NOTE) The eGFR has been calculated using the CKD EPI equation. This calculation has not been validated in all clinical situations. eGFR's persistently <60 mL/min signify possible Chronic  Kidney Disease.    Anion gap 10 5 - 15    Comment: Performed at [facility], [place] [address], [place], [place] [address]   No results found.  Pending Labs Unresulted Labs (From admission, onward)   Start     Ordered   04/11/18 0500  CBC  Daily,   R     04/10/18 0535   04/11/18 9528  Basic metabolic panel  Daily,   R     04/10/18 0535   04/09/18 1922  CBC with Differential/Platelet  Add-on,   R     04/09/18 1921   04/09/18 1610  Occult blood card to lab, stool Provider will collect  Once,   STAT    Question:  Specimen to be collected by?  Answer:  Provider will collect   04/09/18 1609   Signed and Held  Magnesium  Tomorrow morning,   R    Comments:  Call MD if <1.5    Signed and Held   Signed and Held  Phosphorus  Tomorrow morning,   R     Signed and Held   Signed and Held  TSH  Once,   R    Comments:  Cancel if already done within 1 month and notify MD    Signed and Held   Signed and Held  Comprehensive metabolic panel  Once,   R    Comments:  Cal MD for K<3.5 or >5.0    Signed and Held   Signed and Held  CBC  Once,   R    Comments:  Call for hg <8.0    Signed and Held      Vitals/Pain Today's Vitals   04/10/18 1600 04/10/18 1619 04/10/18 1700 04/10/18 1731  BP: 109/75 109/75 112/65 112/65  Pulse: (!) 59 67 65 65  Resp: 15 19 18 17   Temp:      TempSrc:      SpO2: 100% 100% 100% 100%  PainSc:        Isolation Precautions No active isolations  Medications Medications  dexamethasone (DECADRON) injection 20 mg (20 mg Intravenous Given 04/09/18 2100)  donepezil (ARICEPT) tablet 10  mg (has no administration in time range)  Immune Globulin 10% (PRIVIGEN) IV infusion 1 g/kg (has no administration in time range)  0.9 %  sodium chloride infusion ( Intravenous New Bag/Given 04/10/18 0303)    Mobility non-ambulatory

## 2018-04-10 NOTE — ED Notes (Signed)
Report given to RN

## 2018-04-10 NOTE — Progress Notes (Signed)
[facility]                                                                              Patient Demographics  [name], is a 77 y.o. female, DOB - [birth year], QMG:[id number]  Admit date - 04/09/2018   Admitting Physician Toy Baker, MD  Outpatient Primary MD for the patient is [name], DO  Outpatient specialists:   LOS - 1  days   Medical records reviewed and are as summarized below:    Chief Complaint  Patient presents with  . Rectal Bleeding  . abnormal lab value    low platelet count       Brief summary   Patient is a 77 year old female with breast CA, diabetes mellitus type 2, COPD, OSA, chronic diastolic heart failure, persistent atrial fibrillation on Eliquis, dementia, and diverticulosis, possible ITP history of pulmonary hypertensionpresented with rectal bleeding at the skilled nursing facility. CBC was checked and platelets were noted 3, patient takes Eliquis for atrial fibrillation and had a dose on 4/29. Patient has dementia and unable to provide much history, patient was admitted 3 weeks ago with similar history and was noticed to have a platelet count of 5, received 2 units of platelets, IVIG and dexamethasone by hematology. Patient was admitted for further work-up, platelets had gone down to 6 at the time of admission. No further bleeding was noticed.  Assessment & Plan    Principal Problem:   Acute ITP (HCC) with the thrombocytopenia, anemia Platelets 6 at the time of admission, was transfused 2 units of platelets pheresis, improved to 15 K -Continue dexamethasone 20 mg IV every 24 hours -Discussed with oncology, Dr. Alen Blew, who will evaluate patient for further recommendations.  Active Problems: Rectal bleeding Secondary to #1, patient also has a history of diverticulosis.  If no significant improvement or rebleeding, may need GI evaluation.  Chronic diastolic CHF -Currently compensated, may need to restart Lasix  with transfusions  Dementia Currently appears to be close to baseline, monitor for sundowning Continue Aricept  Hypertension -Currently stable  Chronic atrial fibrillation -Currently rate controlled, Eliquis was held from the previous admission and patient had restarted it. -Continue to hold anticoagulation   COPD -No wheezing    Breast cancer of lower-inner quadrant of right female breast ([place]) -Distant history, currently in remission    CKD (chronic kidney disease) stage 2, GFR 60-89 ml/min -Creatinine currently at baseline  Code Status: Full code DVT Prophylaxis:   SCD's Family Communication: Discussed in detail with the patient, all imaging results, lab results explained to the patient and daughter at the bedside   Disposition Plan:   Time Spent in minutes 35 minutes  Procedures:  None  Consultants:   Hematology, Dr. Alen Blew  Antimicrobials:      Medications  Scheduled Meds: . dexamethasone  20 mg Intravenous Q24H   Continuous Infusions: PRN Meds:.   Antibiotics   Anti-infectives (From admission, onward)   None        Subjective:   [name] was seen and examined today.  No new complaints, no rectal bleeding at this time. Patient denies dizziness, chest pain, shortness of  breath,  N/V/D/C, new weakness, numbess, tingling. No acute abdominal pain.  Objective:   Vitals:   04/10/18 0734 04/10/18 0848 04/10/18 1006 04/10/18 1111  BP: 113/78 110/74 118/66 116/81  Pulse: 69 70 69 68  Resp: 17 (!) 21 (!) 22 20  Temp:      TempSrc:      SpO2: 100% 100% 100% 100%    Intake/Output Summary (Last 24 hours) at 04/10/2018 1357 Last data filed at 04/10/2018 0301 Gross per 24 hour  Intake 1575 ml  Output -  Net 1575 ml     Wt Readings from Last 3 Encounters:  03/21/18 89.4 kg (197 lb 1.5 oz)  05/19/17 88.5 kg (195 lb)  05/10/17 88.9 kg (196 lb)     Exam  General: Alert and oriented x 2, NAD  Eyes:   HEENT:    Cardiovascular: S1  S2 auscultated, iregular   Respiratory: Clear to auscultation bilaterally, no wheezing, rales or rhonchi  Gastrointestinal: Soft, nontender, nondistended, + bowel sounds  Ext: no pedal edema bilaterally  Neuro: no new deficits  Musculoskeletal: No digital cyanosis, clubbing  Skin: No rashes  Psych: alert and awake    Data Reviewed:  I have personally reviewed following labs and imaging studies  Micro Results No results found for this or any previous visit (from the past 240 hour(s)).  Radiology Reports Dg Chest Port 1 View  Result Date: 03/18/2018 CLINICAL DATA:  Shortness of breath today EXAM: PORTABLE CHEST 1 VIEW COMPARISON:  October 05, 2015 FINDINGS: The heart size and mediastinal contours are within normal limits. Mild chronic increased pulmonary interstitium is identified bilaterally unchanged. There is no focal pneumonia or pleural effusion. The visualized skeletal structures are stable. IMPRESSION: No active cardiopulmonary disease. Electronically Signed   By: Abelardo Diesel M.D.   On: 03/18/2018 17:27    Lab Data:  CBC: Recent Labs  Lab 04/09/18 1604 04/10/18 0548  WBC 9.9 7.5  NEUTROABS 8.0*  --   HGB 10.9* 10.1*  HCT 35.1* 32.1*  MCV 80.5 79.7  PLT 6* 15*   Basic Metabolic Panel: Recent Labs  Lab 04/09/18 1604 04/10/18 0548  NA 139 141  K 4.4 4.0  CL 98* 99*  CO2 30 32  GLUCOSE 177* 135*  BUN 23* 20  CREATININE 0.78 0.69  CALCIUM 9.4 9.4   GFR: CrCl cannot be calculated (Unknown ideal weight.). Liver Function Tests: Recent Labs  Lab 04/09/18 1604 04/10/18 0548  AST 19 16  ALT 13* 13*  ALKPHOS 55 55  BILITOT 0.4 0.7  PROT 6.8 6.8  ALBUMIN 2.8* 2.8*   No results for input(s): LIPASE, AMYLASE in the last 168 hours. No results for input(s): AMMONIA in the last 168 hours. Coagulation Profile: Recent Labs  Lab 04/09/18 1604  INR 1.49   Cardiac Enzymes: No results for input(s): CKTOTAL, CKMB, CKMBINDEX, TROPONINI in the last 168  hours. BNP (last 3 results) No results for input(s): PROBNP in the last 8760 hours. HbA1C: No results for input(s): HGBA1C in the last 72 hours. CBG: No results for input(s): GLUCAP in the last 168 hours. Lipid Profile: No results for input(s): CHOL, HDL, LDLCALC, TRIG, CHOLHDL, LDLDIRECT in the last 72 hours. Thyroid Function Tests: No results for input(s): TSH, T4TOTAL, FREET4, T3FREE, THYROIDAB in the last 72 hours. Anemia Panel: No results for input(s): VITAMINB12, FOLATE, FERRITIN, TIBC, IRON, RETICCTPCT in the last 72 hours. Urine analysis:    Component Value Date/Time   COLORURINE YELLOW 04/10/2018 [place]  TURBID (A) 04/10/2018 0331   LABSPEC 1.014 04/10/2018 0331   PHURINE 5.0 04/10/2018 0331   GLUCOSEU NEGATIVE 04/10/2018 0331   HGBUR LARGE (A) 04/10/2018 0331   BILIRUBINUR NEGATIVE 04/10/2018 0331   KETONESUR NEGATIVE 04/10/2018 0331   PROTEINUR 100 (A) 04/10/2018 0331   UROBILINOGEN 1.0 10/06/2015 1210   NITRITE POSITIVE (A) 04/10/2018 0331   LEUKOCYTESUR LARGE (A) 04/10/2018 0331     Jyll Tomaro M.D. [facility] 04/10/2018, 1:57 PM  Pager: [phone] Between 7am to 7pm - call Pager - [phone]  After 7pm go to [email or web address] - password [email or web address]  Call night coverage person covering after 7pm

## 2018-04-10 NOTE — Progress Notes (Signed)
I was asked to evaluate this patient with acute ITP and bleeding.  He is currently receiving dexamethasone received platelet transfusion.  Patient is well-known to Dr. Lindi Adie who will be seeing the patient later on today.  He is aware of her hospitalization and will help manage her with the primary team.

## 2018-04-10 NOTE — Progress Notes (Signed)
Hematology I will see the patient later today. On reviewing the chart, patient has a relapse of ITP with severe thrombocytopenia. Decadron started already Will give IVIG tomorrow and day after

## 2018-04-11 LAB — CBC
HCT: 28.4 % — ABNORMAL LOW (ref 36.0–46.0)
HEMATOCRIT: 31.2 % — AB (ref 36.0–46.0)
HEMOGLOBIN: 9.9 g/dL — AB (ref 12.0–15.0)
HEMOGLOBIN: 9 g/dL — AB (ref 12.0–15.0)
MCH: 25 pg — ABNORMAL LOW (ref 26.0–34.0)
MCH: 25 pg — AB (ref 26.0–34.0)
MCHC: 31.7 g/dL (ref 30.0–36.0)
MCHC: 31.7 g/dL (ref 30.0–36.0)
MCV: 78.8 fL (ref 78.0–100.0)
MCV: 78.9 fL (ref 78.0–100.0)
PLATELETS: 50 10*3/uL — AB (ref 150–400)
Platelets: 31 10*3/uL — ABNORMAL LOW (ref 150–400)
RBC: 3.96 MIL/uL (ref 3.87–5.11)
RBC: 3.6 MIL/uL — AB (ref 3.87–5.11)
RDW: 15.1 % (ref 11.5–15.5)
RDW: 14.9 % (ref 11.5–15.5)
WBC: 8.3 10*3/uL (ref 4.0–10.5)
WBC: 8.9 10*3/uL (ref 4.0–10.5)

## 2018-04-11 LAB — BASIC METABOLIC PANEL
ANION GAP: 14 (ref 5–15)
BUN: 27 mg/dL — ABNORMAL HIGH (ref 6–20)
CHLORIDE: 98 mmol/L — AB (ref 101–111)
CO2: 27 mmol/L (ref 22–32)
Calcium: 8.9 mg/dL (ref 8.9–10.3)
Creatinine, Ser: 0.63 mg/dL (ref 0.44–1.00)
GFR calc non Af Amer: >60 mL/min (ref >60)
GLUCOSE: 180 mg/dL — AB (ref 65–99)
Potassium: 3.9 mmol/L (ref 3.5–5.1)
Sodium: 139 mmol/L (ref 135–145)

## 2018-04-11 LAB — GLUCOSE, CAPILLARY
GLUCOSE-CAPILLARY: 175 mg/dL — AB (ref 65–99)
GLUCOSE-CAPILLARY: 244 mg/dL — AB (ref 65–99)
Glucose-Capillary: 247 mg/dL — ABNORMAL HIGH (ref 65–99)
Glucose-Capillary: 304 mg/dL — ABNORMAL HIGH (ref 65–99)

## 2018-04-11 LAB — TROPONIN I

## 2018-04-11 MED ORDER — ATROPINE SULFATE 1 MG/10ML IJ SOSY
PREFILLED_SYRINGE | INTRAMUSCULAR | Status: AC
  Filled 2018-04-11: qty 10

## 2018-04-11 MED ORDER — FUROSEMIDE 20 MG PO TABS
10.0000 mg | ORAL_TABLET | Freq: Every day | ORAL | Status: DC
  Administered 2018-04-11 – 2018-04-17 (×7): 10 mg via ORAL
  Filled 2018-04-11 (×7): qty 1

## 2018-04-11 NOTE — Progress Notes (Signed)
HEMATOLOGY-ONCOLOGY PROGRESS NOTE This note is for my interaction on 4/31/19 SUBJECTIVE: patient resting, Advanced dementia, Bruising on arms OBJECTIVE: REVIEW OF SYSTEMS:   Not done because patient was resting and did not wake up to call  PHYSICAL EXAMINATION: ECOG PERFORMANCE STATUS: 4 - Bedbound  Vitals:   04/11/18 1500 04/11/18 1600  BP: (!) 94/54   Pulse: (!) 58   Resp: 19   Temp:  (!) 97.5 F (36.4 C)  SpO2: 99%    Filed Weights   04/10/18 1815  Weight: 197 lb 1.5 oz (89.4 kg)    GENERALSleeping SKIN: Bruising EYES: normal, Conjunctiva are pink and non-injected, sclera clear LUNGS: clear to auscultation and percussion with normal breathing effort HEART: regular rate & rhythm and no murmurs and no lower extremity edema ABDOMEN:abdomen soft, non-tender and normal bowel sounds NEURO: dementia  LABORATORY DATA:  I have reviewed the data as listed CMP Latest Ref Rng & Units 04/11/2018 04/10/2018 04/10/2018  Glucose 65 - 99 mg/dL 180(H) 185(H) 135(H)  BUN 6 - 20 mg/dL 27(H) 25(H) 20  Creatinine 0.44 - 1.00 mg/dL 0.63 0.70 0.69  Sodium 135 - 145 mmol/L 139 140 141  Potassium 3.5 - 5.1 mmol/L 3.9 4.4 4.0  Chloride 101 - 111 mmol/L 98(L) 100(L) 99(L)  CO2 22 - 32 mmol/L 27 30 32  Calcium 8.9 - 10.3 mg/dL 8.9 9.3 9.4  Total Protein 6.5 - 8.1 g/dL - 7.1 6.8  Total Bilirubin 0.3 - 1.2 mg/dL - 0.7 0.7  Alkaline Phos 38 - 126 U/L - 53 55  AST 15 - 41 U/L - 18 16  ALT 14 - 54 U/L - 17 13(L)    Lab Results  Component Value Date   WBC 8.9 04/11/2018   HGB 9.0 (L) 04/11/2018   HCT 28.4 (L) 04/11/2018   MCV 78.9 04/11/2018   PLT 50 (L) 04/11/2018   NEUTROABS 8.0 (H) 04/09/2018    ASSESSMENT AND PLAN: 1. Acute ITP: On dexamethasone, adding IVIG for 5/1 and 5/2 2. Patient will need a slow taper of steroids Will follow

## 2018-04-11 NOTE — Progress Notes (Addendum)
Patient ID: [name], female   DOB: Nov 08, 1941, 77 y.o.   MRN: [id number]                                                                PROGRESS NOTE                                                                                                                                                                                                             Patient Demographics:    [name], is a 77 y.o. female, DOB - 1941/03/17, [id number]:[id number]  Admit date - 04/09/2018   Admitting Physician Toy Baker, MD  Outpatient Primary MD for the patient is [name], DO  LOS - 2  Outpatient Specialists:     Chief Complaint  Patient presents with  . Rectal Bleeding  . abnormal lab value    low platelet count       Brief Narrative   77 y.o. female with medical history significant of breast CA, diabetes mellitus type 2, COPD, OSA, chronic diastolic heart failure, persistent atrial fibrillation on Eliquis, dementia, and diverticulosis, possible ITP history of pulmonary hypertension   Presented with   Blood in depends yesterday rechecked CBC and noted plt 3 brought to ER.  Family noted diffuse rash synovitis persistent from prior patient's Eliquis has been restarted her last dose was today She has dementia at baseline per family currently appears to be at her baseline Last admittion for rectal bleeding 3 wks ago at that time she required transfusions of 2 packets of platelets to bleeding felt to be secondary to severe thrombocytopenia during that time her platelet count was 5 after receiving 2.  Units of platelets count improved to 221 and bleeding resolved differential included ITP versus bone marrow failure hematology consulted recommended IVIG and Decadron. (on IV dexamethasone, 20 mg daily for 4 days and IVIG at the rate of 1 g/kg daily IV for x2 days)   Regarding pertinent Chronic problems: History of cancer status post lumpectomy 2012 and radiation therapy treated with  Aromasin   While in ER:  Noted to have platelets down to 6 globin is stable at 10.9  ER provider did not noticed bright red blood per rectum but stool is Hemoccult positive     Subjective:    Sharon  Larson today seem sleepy,  axox2  Almost 3 (person, city, year gave as 2018), afebrile, currently receiving plt  No headache, No chest pain, No abdominal pain - No Nausea, No new weakness tingling or numbness, No Cough - SOB.   Assessment  & Plan :    Principal Problem:   Acute ITP ([place]) Active Problems:   Obstructive sleep apnea   Anemia   Breast cancer of lower-inner quadrant of right female breast (HCC)   Hyperlipidemia   Lower GI bleed   COPD (chronic obstructive pulmonary disease) (HCC)   Chronic atrial fibrillation (HCC)   Chronic diastolic CHF (congestive heart failure) (HCC)   Hypertension   Diabetes mellitus type 2 in obese (HCC)   Dementia without behavioral disturbance   CKD (chronic kidney disease) stage 2, GFR 60-89 ml/min   Acute ITP (HCC) with the thrombocytopenia, anemia Platelets 6 at the time of admission, was transfused 2 units of platelets pheresis, improved to 15 K -Continue dexamethasone 20 mg IV every 24 hours -Anticipate IVIG per Gudena's note -appreciate oncology input Check cbc in am  Active Problems: Rectal bleeding  Hgb relatively stable Secondary to #1, patient also has a history of diverticulosis.  If no significant improvement or rebleeding, may need GI evaluation.   Chronic diastolic CHF Currently compensated,  restart lasix at 10mg  po qday (04/11/2018)  Dementia Currently appears to be close to baseline, monitor for sundowning Continue Aricept  Hypertension Currently stable  Chronic atrial fibrillation Currently rate controlled, Eliquis was held from the previous admission and patient had restarted it. Continue to hold anticoagulation   COPD No wheezing  Breast cancer of lower-inner quadrant of right female breast  ([place]) Distant history, currently in remission    CKD (chronic kidney disease) stage 2, GFR 60-89 ml/min -Creatinine currently at baseline    Code Status :  Full code  Family Communication  : w patient  Disposition Plan  :    Remain stepdown  Barriers For Discharge :   Consults  :  oncology  Procedures  :   DVT Prophylaxis  :  SCDs   Lab Results  Component Value Date   PLT 31 (L) 04/11/2018    Antibiotics  :  none  Anti-infectives (From admission, onward)   None        Objective:   Vitals:   04/11/18 0400 04/11/18 0452 04/11/18 0500 04/11/18 0545  BP: (!) 106/48  (!) 104/55 (!) 111/58  Pulse: 73  83 (!) 123  Resp: 16  19 12   Temp:  97.8 F (36.6 C)  98.2 F (36.8 C)  TempSrc:  Axillary  Axillary  SpO2: 93%  (!) 87% 99%  Weight:      Height:        Wt Readings from Last 3 Encounters:  04/10/18 89.4 kg (197 lb 1.5 oz)  03/21/18 89.4 kg (197 lb 1.5 oz)  05/19/17 88.5 kg (195 lb)     Intake/Output Summary (Last 24 hours) at 04/11/2018 0608 Last data filed at 04/11/2018 0452 Gross per 24 hour  Intake 574 ml  Output -  Net 574 ml     Physical Exam  Awake Alert, Oriented X 3, No new F.N deficits, Normal affect [place].AT,PERRAL Supple Neck,No JVD, No cervical lymphadenopathy appriciated.  Symmetrical Chest wall movement, Good air movement bilaterally, CTAB RRR,No Gallops,Rubs or new Murmurs, No Parasternal Heave +ve B.Sounds, Abd Soft, No tenderness, No organomegaly appriciated, No rebound - guarding or rigidity. No Cyanosis, Clubbing or edema Slight  bruise on right forearm    Data Review:    CBC Recent Labs  Lab 04/09/18 1604 04/10/18 0548 04/10/18 1826 04/11/18 0306  WBC 9.9 7.5 6.6 8.3  HGB 10.9* 10.1* 10.7* 9.9*  HCT 35.1* 32.1* 33.6* 31.2*  PLT 6* 15* 16* 31*  MCV 80.5 79.7 79.8 78.8  MCH 25.0* 25.1* 25.4* 25.0*  MCHC 31.1 31.5 31.8 31.7  RDW 15.3 15.6* 15.2 15.1  LYMPHSABS 1.3  --   --   --   MONOABS 0.6  --   --   --   EOSABS  0.1  --   --   --   BASOSABS 0.0  --   --   --     Chemistries  Recent Labs  Lab 04/09/18 1604 04/10/18 0548 04/10/18 1826 04/11/18 0306  NA 139 141 140 139  K 4.4 4.0 4.4 3.9  CL 98* 99* 100* 98*  CO2 30 32 30 27  GLUCOSE 177* 135* 185* 180*  BUN 23* 20 25* 27*  CREATININE 0.78 0.69 0.70 0.63  CALCIUM 9.4 9.4 9.3 8.9  MG  --   --  1.6*  --   AST 19 16 18   --   ALT 13* 13* 17  --   ALKPHOS 55 55 53  --   BILITOT 0.4 0.7 0.7  --    ------------------------------------------------------------------------------------------------------------------ No results for input(s): CHOL, HDL, LDLCALC, TRIG, CHOLHDL, LDLDIRECT in the last 72 hours.  Lab Results  Component Value Date   HGBA1C 5.7 02/21/2017   ------------------------------------------------------------------------------------------------------------------ Recent Labs    04/10/18 1826  TSH 1.622   ------------------------------------------------------------------------------------------------------------------ No results for input(s): VITAMINB12, FOLATE, FERRITIN, TIBC, IRON, RETICCTPCT in the last 72 hours.  Coagulation profile Recent Labs  Lab 04/09/18 1604  INR 1.49    No results for input(s): DDIMER in the last 72 hours.  Cardiac Enzymes No results for input(s): CKMB, TROPONINI, MYOGLOBIN in the last 168 hours.  Invalid input(s): CK ------------------------------------------------------------------------------------------------------------------    Component Value Date/Time   BNP 63.8 03/24/2014 1757    Inpatient Medications  Scheduled Meds: . acetaminophen  650 mg Oral Q24H  . dexamethasone  20 mg Intravenous Q24H  . diltiazem  240 mg Oral q morning - 10a  . diphenhydrAMINE  25 mg Oral Q24H  . donepezil  10 mg Oral QHS  . insulin aspart  0-5 Units Subcutaneous QHS  . insulin aspart  0-9 Units Subcutaneous TID WC  . metoCLOPramide  5 mg Oral BID  . mometasone-formoterol  2 puff Inhalation BID   . venlafaxine XR  37.5 mg Oral Q breakfast   Continuous Infusions: . IMMUNE GLOBULIN 10% (HUMAN) IV - For Fluid Restriction Only     PRN Meds:.acetaminophen **OR** acetaminophen, albuterol, HYDROcodone-acetaminophen, ondansetron **OR** ondansetron (ZOFRAN) IV  Micro Results No results found for this or any previous visit (from the past 240 hour(s)).  Radiology Reports Dg Chest Port 1 View  Result Date: 03/18/2018 CLINICAL DATA:  Shortness of breath today EXAM: PORTABLE CHEST 1 VIEW COMPARISON:  October 05, 2015 FINDINGS: The heart size and mediastinal contours are within normal limits. Mild chronic increased pulmonary interstitium is identified bilaterally unchanged. There is no focal pneumonia or pleural effusion. The visualized skeletal structures are stable. IMPRESSION: No active cardiopulmonary disease. Electronically Signed   By: Abelardo Diesel M.D.   On: 03/18/2018 17:27    Time Spent in minutes  30   Jani Gravel M.D on 04/11/2018 at 6:08 AM  Between 7am to 7pm - Pager -  [phone]    After 7pm go to [email or web address] - password [facility]  [facility] -  Office  [phone]

## 2018-04-12 ENCOUNTER — Encounter (HOSPITAL_COMMUNITY): Payer: Self-pay | Admitting: *Deleted

## 2018-04-12 LAB — COMPREHENSIVE METABOLIC PANEL
ALT: 16 U/L (ref 14–54)
AST: 19 U/L (ref 15–41)
Albumin: 2.5 g/dL — ABNORMAL LOW (ref 3.5–5.0)
Alkaline Phosphatase: 51 U/L (ref 38–126)
Anion gap: 9 (ref 5–15)
BILIRUBIN TOTAL: 0.6 mg/dL (ref 0.3–1.2)
BUN: 48 mg/dL — AB (ref 6–20)
CO2: 27 mmol/L (ref 22–32)
CREATININE: 1.06 mg/dL — AB (ref 0.44–1.00)
Calcium: 8.4 mg/dL — ABNORMAL LOW (ref 8.9–10.3)
Chloride: 96 mmol/L — ABNORMAL LOW (ref 101–111)
GFR, EST AFRICAN AMERICAN: 57 mL/min — AB (ref >60)
GFR, EST NON AFRICAN AMERICAN: 49 mL/min — AB (ref >60)
Glucose, Bld: 308 mg/dL — ABNORMAL HIGH (ref 65–99)
POTASSIUM: 4.3 mmol/L (ref 3.5–5.1)
Sodium: 132 mmol/L — ABNORMAL LOW (ref 135–145)
TOTAL PROTEIN: 7.5 g/dL (ref 6.5–8.1)

## 2018-04-12 LAB — CBC
HEMATOCRIT: 28.1 % — AB (ref 36.0–46.0)
Hemoglobin: 8.9 g/dL — ABNORMAL LOW (ref 12.0–15.0)
MCH: 25.1 pg — AB (ref 26.0–34.0)
MCHC: 31.7 g/dL (ref 30.0–36.0)
MCV: 79.4 fL (ref 78.0–100.0)
Platelets: 84 10*3/uL — ABNORMAL LOW (ref 150–400)
RBC: 3.54 MIL/uL — ABNORMAL LOW (ref 3.87–5.11)
RDW: 14.7 % (ref 11.5–15.5)
WBC: 6.9 10*3/uL (ref 4.0–10.5)

## 2018-04-12 LAB — BPAM PLATELET PHERESIS
Blood Product Expiration Date: 201905020327
Blood Product Expiration Date: 201905032359
ISSUE DATE / TIME: 201905010021
ISSUE DATE / TIME: 201905010553
Unit Type and Rh: 6200
Unit Type and Rh: 6200

## 2018-04-12 LAB — PREPARE PLATELET PHERESIS
Unit division: 0
Unit division: 0

## 2018-04-12 LAB — GLUCOSE, CAPILLARY
GLUCOSE-CAPILLARY: 277 mg/dL — AB (ref 65–99)
GLUCOSE-CAPILLARY: 381 mg/dL — AB (ref 65–99)
Glucose-Capillary: 394 mg/dL — ABNORMAL HIGH (ref 65–99)
Glucose-Capillary: 287 mg/dL — ABNORMAL HIGH (ref 65–99)

## 2018-04-12 LAB — TROPONIN I: Troponin I: <0.03 ng/mL (ref <0.03)

## 2018-04-12 MED ORDER — DEXAMETHASONE SODIUM PHOSPHATE 10 MG/ML IJ SOLN
10.0000 mg | INTRAMUSCULAR | Status: DC
  Administered 2018-04-12 – 2018-04-16 (×5): 10 mg via INTRAVENOUS
  Filled 2018-04-12 (×5): qty 1

## 2018-04-12 NOTE — Progress Notes (Signed)
HEMATOLOGY-ONCOLOGY PROGRESS NOTE  SUBJECTIVE: Patient is awake and alert today. She had bradycardia during IVIG infusion which was short lasting. She was able to continue IVIG and finish it . Her bleeding appears to be better  OBJECTIVE: REVIEW OF SYSTEMS:   Constitutional: Denies fevers, chills or abnormal weight loss Eyes: Denies blurriness of vision Ears, nose, mouth, throat, and face: Denies mucositis or sore throat Respiratory: Denies cough, dyspnea or wheezes Cardiovascular: Denies palpitation, chest discomfort Gastrointestinal:  Denies nausea Skin: Bruises Lymphatics: Denies new lymphadenopathy or easy bruising Neurological:Awake and answers questions Extremities: No lower extremity edema  All other systems were reviewed with the patient and are negative.  I have reviewed the past medical history, past surgical history, social history and family history with the patient and they are unchanged from previous note.   PHYSICAL EXAMINATION: ECOG PERFORMANCE STATUS: 3 - Symptomatic, >50% confined to bed  Vitals:   04/12/18 1540 04/12/18 1545  BP:  119/74  Pulse:  86  Resp:  19  Temp: 97.9 F (36.6 C)   SpO2:  91%   Filed Weights   04/10/18 1815  Weight: 197 lb 1.5 oz (89.4 kg)    GENERAL:alert, no distress and comfortable SKIN: skin color, texture, turgor are normal, no rashes or significant lesions EYES: normal, Conjunctiva are pink and non-injected, sclera clear OROPHARYNX:no exudate, no erythema and lips, buccal mucosa, and tongue normal  NECK: supple, thyroid normal size, non-tender, without nodularity LYMPH:  no palpable lymphadenopathy in the cervical, axillary or inguinal LUNGS: clear to auscultation and percussion with normal breathing effort HEART: regular rate & rhythm and no murmurs and no lower extremity edema ABDOMEN:abdomen soft, non-tender and normal bowel sounds Musculoskeletal:no cyanosis of digits and no clubbing  NEURO: alert & oriented x 3  with fluent speech, no focal motor/sensory deficits  LABORATORY DATA:  I have reviewed the data as listed CMP Latest Ref Rng & Units 04/12/2018 04/11/2018 04/10/2018  Glucose 65 - 99 mg/dL 308(H) 180(H) 185(H)  BUN 6 - 20 mg/dL 48(H) 27(H) 25(H)  Creatinine 0.44 - 1.00 mg/dL 1.06(H) 0.63 0.70  Sodium 135 - 145 mmol/L 132(L) 139 140  Potassium 3.5 - 5.1 mmol/L 4.3 3.9 4.4  Chloride 101 - 111 mmol/L 96(L) 98(L) 100(L)  CO2 22 - 32 mmol/L 27 27 30   Calcium 8.9 - 10.3 mg/dL 8.4(L) 8.9 9.3  Total Protein 6.5 - 8.1 g/dL 7.5 - 7.1  Total Bilirubin 0.3 - 1.2 mg/dL 0.6 - 0.7  Alkaline Phos 38 - 126 U/L 51 - 53  AST 15 - 41 U/L 19 - 18  ALT 14 - 54 U/L 16 - 17    Lab Results  Component Value Date   WBC 6.9 04/12/2018   HGB 8.9 (L) 04/12/2018   HCT 28.1 (L) 04/12/2018   MCV 79.4 04/12/2018   PLT 84 (L) 04/12/2018   NEUTROABS 8.0 (H) 04/09/2018    ASSESSMENT AND PLAN: 1. Acute ITP: Platelet count has improved to 84. 2. I will decrease the dosage of dexamethasone to 10 mg 3. Plan is to slowly taper dexamethasone over the next 2 to 3 weeks. 4. Hyperglycemia: Due to steroids Will follow

## 2018-04-12 NOTE — Progress Notes (Signed)
Inpatient Diabetes Program Recommendations  AACE/ADA: New Consensus Statement on Inpatient Glycemic Control (2015)  Target Ranges:  Prepandial:   less than 140 mg/dL      Peak postprandial:   less than 180 mg/dL (1-2 hours)      Critically ill patients:  140 - 180 mg/dL   Results for [name], [name] (MRN [id number]) as of 04/12/2018 09:29  Ref. Range 04/11/2018 07:29 04/11/2018 11:28 04/11/2018 16:00 04/11/2018 21:07  Glucose-Capillary Latest Ref Range: 65 - 99 mg/dL 175 (H) 247 (H) 244 (H) 304 (H)   Results for [name], [name] (MRN [id number]) as of 04/12/2018 09:29  Ref. Range 04/12/2018 07:36  Glucose-Capillary Latest Ref Range: 65 - 99 mg/dL 277 (H)     Home DM Meds: None listed  Current Insulin Orders: Novolog Sensitive Correction Scale/ SSI (0-9 units) TID AC + HS      Note patient getting Decadron 20 mg daily.  Has History of Type 2 DM diet controlled at home.  CBGs elevated in hospital likely due to steroids.   MD- Please consider the following in-hospital insulin adjustments while patient getting IV steroids:  1. Start Lantus 13 units daily (0.15 units/kg dosing based on weight of 89 kg)  2. Start Novolog Meal Coverage: Novolog 3 units TID with meals (hold if pt eats <50% of meal)      --Will follow patient during hospitalization--  Wyn Quaker RN, MSN, CDE Diabetes Coordinator Inpatient Glycemic Control Team Team Pager: [phone] (8a-5p)

## 2018-04-12 NOTE — Progress Notes (Signed)
04/12/18 1125  Notified Dr.Kim of patient's 2.2 pause. Per MD continue to monitor.

## 2018-04-12 NOTE — Plan of Care (Signed)
  Problem: Safety: Goal: Ability to remain free from injury will improve Outcome: Progressing   Problem: Skin Integrity: Goal: Risk for impaired skin integrity will decrease Outcome: Progressing   Problem: Pain Managment: Goal: General experience of comfort will improve Outcome: Progressing   

## 2018-04-12 NOTE — Progress Notes (Signed)
Patient ID: [name], female   DOB: [birth year], 77 y.o.   MRN: [id number]                                                                PROGRESS NOTE                                                                                                                                                                                                             Patient Demographics:    [name], is a 77 y.o. female, DOB - May 26, 1941, TDV:[id number]  Admit date - 04/09/2018   Admitting Physician Toy Baker, MD  Outpatient Primary MD for the patient is [name], DO  LOS - 3  Outpatient Specialists: Nicholas Lose  Chief Complaint  Patient presents with  . Rectal Bleeding  . abnormal lab value    low platelet count       Brief Narrative   77 y.o.femalewith medical history significant of breast CA,diabetes mellitus type 2, COPD, OSA, chronic diastolic heart failure, persistent atrial fibrillation on Eliquis, dementia, and diverticulosis, possible ITPhistory of pulmonary hypertension   Presented withBlood in depends yesterday rechecked CBC and noted plt 3 brought to ER.Family noted diffuse rash synovitis persistent from prior patient's Eliquis has been restarted her last dose was today She has dementia at baseline per family currently appears to be at her baseline Last admittion for rectal bleeding 3 wks agoat that time she required transfusions of 2 packets of platelets to bleeding felt to be secondary to severe thrombocytopenia during that time her platelet count was5after receiving 2.Units of platelets count improved to 221 and bleeding resolved differential included ITP versus bone marrow failure hematology consulted recommended IVIG and Decadron. (on IV dexamethasone, 20 mg daily for 4 days and IVIG at the rate of 1 g/kg daily IV for x2 days)  Regarding pertinent Chronic problems:History of cancer status post lumpectomy 2012 and radiation therapy  treated with Aromasin  While in ER:  Noted to have platelets down to 6 globin is stable at 10.9  ER provider did not noticed bright red blood per rectum but stool is Hemoccult positive      Subjective:    [name] today is doing well, no bleeding. Afebrile overnite.  ivig was stopped due to bradycardia yesterday.  Her heart rate improved.    No headache, No chest pain, No abdominal pain - No Nausea, No new weakness tingling or numbness, No Cough - SOB.    Assessment  & Plan :    Principal Problem:   Acute ITP ([place]) Active Problems:   Obstructive sleep apnea   Anemia   Breast cancer of lower-inner quadrant of right female breast (HCC)   Hyperlipidemia   Lower GI bleed   COPD (chronic obstructive pulmonary disease) (HCC)   Chronic atrial fibrillation (HCC)   Chronic diastolic CHF (congestive heart failure) (HCC)   Hypertension   Diabetes mellitus type 2 in obese (HCC)   Dementia without behavioral disturbance   CKD (chronic kidney disease) stage 2, GFR 60-89 ml/min     Acute ITP (HCC) with the thrombocytopenia, anemia Platelets 6 at the time of admission, was transfused 2 units of platelets pheresis, improved to 15 K=> 84 -Continue dexamethasone 20 mg IV every 24 hours - IVIG stopped due to bradycardia -appreciate oncology input Check cbc in am  Active Problems: Rectal bleeding  Hgb rstable Secondary to #1, patient also has a history of diverticulosis.   Chronic diastolic CHF Currently compensated,  restart lasix at 10mg  po qday (04/11/2018) Increase lasix to baseline at 20mg  po qday (04/12/2018)  Dementia Currently appears to be close to baseline, monitor for sundowning Continue Aricept  Hypertension Currently stable  Chronic atrial fibrillation Currently rate controlled, Eliquis was held from the previous admission and patient had restarted it. Continue to hold anticoagulation   COPD No wheezing  Breast cancer of lower-inner quadrant  of right female breast ([place]) Distant history, currently in remission  CKD (chronic kidney disease) stage 2, GFR 60-89 ml/min -Creatinine currently at baseline    Code Status :  Full code  Family Communication  : w patient  Disposition Plan  :    Remain stepdown  Barriers For Discharge :   Consults  :  oncology  Procedures  :   DVT Prophylaxis  :  SCDs   RecentLabs       Lab Results  Component Value Date   PLT 31 (L) 04/11/2018      Antibiotics  :  none        Anti-infectives (From admission, onward)   None        Objective:   Vitals:   04/12/18 0200 04/12/18 0300 04/12/18 0352 04/12/18 0400  BP: (!) 98/57 (!) 103/54  (!) 102/52  Pulse: (!) 49 60  61  Resp: (!) 21 (!) 25    Temp:   98.1 F (36.7 C)   TempSrc:   Oral   SpO2: 92% 91%  93%  Weight:      Height:        Wt Readings from Last 3 Encounters:  04/10/18 89.4 kg (197 lb 1.5 oz)  03/21/18 89.4 kg (197 lb 1.5 oz)  05/19/17 88.5 kg (195 lb)     Intake/Output Summary (Last 24 hours) at 04/12/2018 0526 Last data filed at 04/11/2018 1800 Gross per 24 hour  Intake 420 ml  Output 550 ml  Net -130 ml     Physical Exam  Awake Alert, Oriented X 3, No new F.N deficits, Normal affect [place].AT,PERRAL Supple Neck,No JVD, No cervical lymphadenopathy appriciated.  Symmetrical Chest wall movement, Good air movement bilaterally, CTAB Irr, irr, s1, s2,  +ve B.Sounds, Abd Soft, No tenderness, No organomegaly appriciated, No rebound - guarding or rigidity. No Cyanosis, Clubbing or edema, No new Rash  or bruise  Pacer pads on    Data Review:    CBC Recent Labs  Lab 04/09/18 1604 04/10/18 0548 04/10/18 1826 04/11/18 0306 04/11/18 1156 04/12/18 0306  WBC 9.9 7.5 6.6 8.3 8.9 6.9  HGB 10.9* 10.1* 10.7* 9.9* 9.0* 8.9*  HCT 35.1* 32.1* 33.6* 31.2* 28.4* 28.1*  PLT 6* 15* 16* 31* 50* 84*  MCV 80.5 79.7 79.8 78.8 78.9 79.4  MCH 25.0* 25.1* 25.4* 25.0* 25.0* 25.1*  MCHC 31.1 31.5  31.8 31.7 31.7 31.7  RDW 15.3 15.6* 15.2 15.1 14.9 14.7  LYMPHSABS 1.3  --   --   --   --   --   MONOABS 0.6  --   --   --   --   --   EOSABS 0.1  --   --   --   --   --   BASOSABS 0.0  --   --   --   --   --     Chemistries  Recent Labs  Lab 04/09/18 1604 04/10/18 0548 04/10/18 1826 04/11/18 0306 04/12/18 0306  NA 139 141 140 139 132*  K 4.4 4.0 4.4 3.9 4.3  CL 98* 99* 100* 98* 96*  CO2 30 32 30 27 27   GLUCOSE 177* 135* 185* 180* 308*  BUN 23* 20 25* 27* 48*  CREATININE 0.78 0.69 0.70 0.63 1.06*  CALCIUM 9.4 9.4 9.3 8.9 8.4*  MG  --   --  1.6*  --   --   AST 19 16 18   --  19  ALT 13* 13* 17  --  16  ALKPHOS 55 55 53  --  51  BILITOT 0.4 0.7 0.7  --  0.6   ------------------------------------------------------------------------------------------------------------------ No results for input(s): CHOL, HDL, LDLCALC, TRIG, CHOLHDL, LDLDIRECT in the last 72 hours.  Lab Results  Component Value Date   HGBA1C 5.7 02/21/2017   ------------------------------------------------------------------------------------------------------------------ Recent Labs    04/10/18 1826  TSH 1.622   ------------------------------------------------------------------------------------------------------------------ No results for input(s): VITAMINB12, FOLATE, FERRITIN, TIBC, IRON, RETICCTPCT in the last 72 hours.  Coagulation profile Recent Labs  Lab 04/09/18 1604  INR 1.49    No results for input(s): DDIMER in the last 72 hours.  Cardiac Enzymes Recent Labs  Lab 04/11/18 1534 04/11/18 2050 04/12/18 0306  TROPONINI <0.03 <0.03 <0.03   ------------------------------------------------------------------------------------------------------------------    Component Value Date/Time   BNP 63.8 03/24/2014 1757    Inpatient Medications  Scheduled Meds: . acetaminophen  650 mg Oral Q24H  . dexamethasone  20 mg Intravenous Q24H  . diphenhydrAMINE  25 mg Oral Q24H  . donepezil  10 mg  Oral QHS  . furosemide  10 mg Oral Daily  . insulin aspart  0-5 Units Subcutaneous QHS  . insulin aspart  0-9 Units Subcutaneous TID WC  . metoCLOPramide  5 mg Oral BID  . mometasone-formoterol  2 puff Inhalation BID  . venlafaxine XR  37.5 mg Oral Q breakfast   Continuous Infusions: . IMMUNE GLOBULIN 10% (HUMAN) IV - For Fluid Restriction Only     PRN Meds:.acetaminophen **OR** acetaminophen, albuterol, HYDROcodone-acetaminophen, ondansetron **OR** ondansetron (ZOFRAN) IV  Micro Results No results found for this or any previous visit (from the past 240 hour(s)).  Radiology Reports Dg Chest Port 1 View  Result Date: 03/18/2018 CLINICAL DATA:  Shortness of breath today EXAM: PORTABLE CHEST 1 VIEW COMPARISON:  October 05, 2015 FINDINGS: The heart size and mediastinal contours are within normal limits. Mild chronic increased pulmonary interstitium is identified  bilaterally unchanged. There is no focal pneumonia or pleural effusion. The visualized skeletal structures are stable. IMPRESSION: No active cardiopulmonary disease. Electronically Signed   By: Abelardo Diesel M.D.   On: 03/18/2018 17:27    Time Spent in minutes  30   Jani Gravel M.D on 04/12/2018 at 5:26 AM  Between 7am to 7pm - Pager - [phone]  After 7pm go to [email or web address] - password [facility]  [facility] -  Office  [phone]

## 2018-04-13 ENCOUNTER — Encounter (HOSPITAL_COMMUNITY): Payer: Self-pay

## 2018-04-13 LAB — CBC
HCT: 27.6 % — ABNORMAL LOW (ref 36.0–46.0)
Hemoglobin: 8.7 g/dL — ABNORMAL LOW (ref 12.0–15.0)
MCH: 25.1 pg — ABNORMAL LOW (ref 26.0–34.0)
MCHC: 31.5 g/dL (ref 30.0–36.0)
MCV: 79.5 fL (ref 78.0–100.0)
Platelets: 130 10*3/uL — ABNORMAL LOW (ref 150–400)
RBC: 3.47 MIL/uL — AB (ref 3.87–5.11)
RDW: 15 % (ref 11.5–15.5)
WBC: 7.1 10*3/uL (ref 4.0–10.5)

## 2018-04-13 LAB — GLUCOSE, CAPILLARY
GLUCOSE-CAPILLARY: 249 mg/dL — AB (ref 65–99)
Glucose-Capillary: 223 mg/dL — ABNORMAL HIGH (ref 65–99)

## 2018-04-13 LAB — COMPREHENSIVE METABOLIC PANEL
ALBUMIN: 2.4 g/dL — AB (ref 3.5–5.0)
ALT: 17 U/L (ref 14–54)
AST: 17 U/L (ref 15–41)
Alkaline Phosphatase: 45 U/L (ref 38–126)
Anion gap: 10 (ref 5–15)
BUN: 50 mg/dL — AB (ref 6–20)
CO2: 27 mmol/L (ref 22–32)
Calcium: 8.4 mg/dL — ABNORMAL LOW (ref 8.9–10.3)
Chloride: 101 mmol/L (ref 101–111)
Creatinine, Ser: 0.92 mg/dL (ref 0.44–1.00)
GFR calc Af Amer: >60 mL/min (ref >60)
GFR, EST NON AFRICAN AMERICAN: 58 mL/min — AB (ref >60)
Glucose, Bld: 223 mg/dL — ABNORMAL HIGH (ref 65–99)
POTASSIUM: 4.1 mmol/L (ref 3.5–5.1)
SODIUM: 138 mmol/L (ref 135–145)
Total Bilirubin: 0.5 mg/dL (ref 0.3–1.2)
Total Protein: 8.8 g/dL — ABNORMAL HIGH (ref 6.5–8.1)

## 2018-04-13 MED ORDER — ORAL CARE MOUTH RINSE
15.0000 mL | Freq: Two times a day (BID) | OROMUCOSAL | Status: DC
  Administered 2018-04-13 – 2018-04-17 (×8): 15 mL via OROMUCOSAL

## 2018-04-13 MED ORDER — PRO-STAT SUGAR FREE PO LIQD
30.0000 mL | Freq: Two times a day (BID) | ORAL | Status: DC
  Administered 2018-04-13 – 2018-04-17 (×9): 30 mL via ORAL
  Filled 2018-04-13 (×9): qty 30

## 2018-04-13 MED ORDER — POLYETHYLENE GLYCOL 3350 17 G PO PACK
17.0000 g | PACK | Freq: Once | ORAL | Status: AC
Start: 2018-04-13 — End: 2018-04-13
  Administered 2018-04-13: 17 g via ORAL
  Filled 2018-04-13: qty 1

## 2018-04-13 NOTE — Progress Notes (Signed)
Results for ROSELL, KHOURI (MRN [id number]) as of 04/13/2018 14:05  Ref. Range 04/12/2018 07:36 04/12/2018 12:35 04/12/2018 15:33 04/12/2018 21:11 04/13/2018 07:32  Glucose-Capillary Latest Ref Range: 65 - 99 mg/dL 277 (H) 394 (H) 381 (H) 287 (H) 249 (H)  Noted that blood sugars continue to be greater than 200 mg/dl. Recommend adding Lantus 10 units daily along with Novolog SENSITIVE correction scale TID & Hs as ordered while patient in the hospital and on steroids.   Harvel Ricks RN BSN CDE Diabetes Coordinator Pager: [phone]  8am-5pm

## 2018-04-13 NOTE — Consult Note (Signed)
Referring Provider:  [place] Primary Care Physician:  [name], DO Primary Gastroenterologist:  Dr. Oletta Lamas  Reason for Consultation:  GI bleed  HPI: [name] is a 77 y.o. female with past medical history of breast cancer, CHF, atrial fibrillation on Eliquis  history of dementia, history of diabetes,history of ITP history of COPD, history of pulmonary hypertension admitted to the hospital for further evaluation of GI bleed and abnormal platelet counts. GI is consulted for further evaluation.  patient was found to have a severe thrombocytopenia with platelet count of 6. Patient with known history of ITP. Currently followed by hematology. Currently on Decadron and IVIG.  Patient seen and examined at bedside. Son at bedside.patient is denying any rectal bleeding for last few weeks but according to family member patient had a bleeding episode during weekend. Denied further bleeding episodes during hospitalization. Denies abdominal pain. Denies nausea vomiting. Complaining of fatigue and weakness. Complaining of generalized discomfort. Denied any fever or chills. Baseline dementia.  Last colonoscopy in April 2013 showed small tubular adenoma, diverticulosis and internal hemorrhoids. Repeat was recommended in 5 years.    Past Medical History:  Diagnosis Date  . Anemia   . Asthma   . Atrial fibrillation, persistent ([place]) 11/01/2013  . Breast cancer, stage 1 ([place]) 10/17/2011   s/p right lumpectomy and XRT  . Bronchitis   . Bruises easily   . Chills   . Chronic anticoagulation, on Eliquis 11/01/2013  . Chronic bronchitis ([place])   . Chronic diastolic CHF (congestive heart failure) ([place])   . Constipation   . COPD (chronic obstructive pulmonary disease) ([place])   . Cough   . Dementia without behavioral disturbance   . Dyslipidemia   . Fracture of left lower leg 06/1972   "no surgery; just casted it"  . Generalized weakness   . Gout attack    ankle, then wrist and hands  . Gout without  tophus   . Hernia   . History of nuclear stress test 02/09/2010   dipyridamole; normal pattern of perfusion in all regions with attenuation artifact in inferior region, low risk   . Hypertension   . Insomnia   . Morbid obesity ([place])   . On home oxygen therapy    "2L prn" (01/24/2014)  . OSA on CPAP   . Pneumonia 2012; 2014  . Protein calorie malnutrition ([place])   . Sarcoid   . Sarcoidosis   . Type II diabetes mellitus ([place])   . Uterine cancer [facility])    s/p hysterectomy  . Wheezing     Past Surgical History:  Procedure Laterality Date  . ABDOMINAL HYSTERECTOMY  02/04/2005  . BREAST BIOPSY Right 01/2011  . BREAST LUMPECTOMY Right 01/2011  . HERNIA REPAIR    . TOTAL KNEE ARTHROPLASTY Bilateral 03/2010; 06/2011   left; right  . TRANSTHORACIC ECHOCARDIOGRAM  07/2008   EF, LV size is normal; RVSP normal; mod calcif of MV apparatus  . TUBAL LIGATION  1970's  . UMBILICAL HERNIA REPAIR  02/04/2005    Prior to Admission medications   Medication Sig Start Date End Date Taking? Authorizing Provider  apixaban (ELIQUIS) 5 MG TABS tablet TAKE 1 TABLET (5 MG TOTAL) BY MOUTH 2 (TWO) TIMES DAILY. 10/22/14  Yes Hilty, Nadean Corwin, MD  Calcium Carb-Cholecalciferol 500-200 MG-UNIT TABS Take 1 tablet by mouth 2 (two) times daily. Reported on 01/26/2016   Yes [provider]  diltiazem (CARDIZEM CD) 240 MG 24 hr capsule Take 240 mg by mouth every morning.  01/20/14  Yes [provider]  docusate sodium (COLACE) 100 MG capsule Take 100 mg by mouth daily as needed for mild constipation.   Yes [provider]  donepezil (ARICEPT) 10 MG tablet Take 10 mg by mouth at bedtime.   Yes [provider]  furosemide (LASIX) 20 MG tablet Take 20 mg by mouth daily.   Yes [provider]  Infant Care Products (BABY [facility] EX) Apply topically. Wash both eyes with diluted baby shampoo every 4 hours while awake   Yes [provider]  lactulose (CHRONULAC) 10 GM/15ML solution Take  by mouth daily. Give 15 mL by mouth   Yes [provider]  metoCLOPramide (REGLAN) 5 MG tablet Take 5 mg by mouth 2 (two) times daily.   Yes [provider]  NYSTATIN powder Apply Nystatin powder under breast and elbow fold 3 times daily for 2 weeks 04/09/18  Yes [provider]  OXYGEN Inhale 2 L/min into the lungs continuous.    Yes [provider]  Propylene Glycol (SYSTANE COMPLETE OP) Place 1 drop into both eyes 3 (three) times daily.   Yes [provider]  SYMBICORT 160-4.5 MCG/ACT inhaler Inhale 2 puffs into the lungs 2 (two) times daily. Rinse mouth well after use 09/04/15  Yes [provider]  venlafaxine XR (EFFEXOR-XR) 37.5 MG 24 hr capsule Take 37.5 mg by mouth daily with breakfast.   Yes [provider]  Vitamin D, Ergocalciferol, (DRISDOL) 50000 units CAPS capsule Take 50,000 Units by mouth every 28 (twenty-eight) days.   Yes [provider]  albuterol (PROVENTIL) (2.5 MG/3ML) 0.083% nebulizer solution Take by nebulization every 6 (six) hours as needed for wheezing or shortness of breath.    [provider]  ondansetron (ZOFRAN) 8 MG tablet Take 8 mg by mouth every 8 (eight) hours as needed for nausea or vomiting.    [provider]    Scheduled Meds: . acetaminophen  650 mg Oral Q24H  . dexamethasone  10 mg Intravenous Q24H  . diphenhydrAMINE  25 mg Oral Q24H  . donepezil  10 mg Oral QHS  . feeding supplement (PRO-STAT SUGAR FREE 64)  30 mL Oral BID  . furosemide  10 mg Oral Daily  . insulin aspart  0-5 Units Subcutaneous QHS  . insulin aspart  0-9 Units Subcutaneous TID WC  . mouth rinse  15 mL Mouth Rinse BID  . metoCLOPramide  5 mg Oral BID  . mometasone-formoterol  2 puff Inhalation BID  . venlafaxine XR  37.5 mg Oral Q breakfast   Continuous Infusions: PRN Meds:.acetaminophen **OR** acetaminophen, albuterol, HYDROcodone-acetaminophen, ondansetron **OR** ondansetron (ZOFRAN)  IV  Allergies as of 04/09/2018 - Review Complete 04/09/2018  Allergen Reaction Noted  . Aspirin Other (See Comments) 05/03/2013  . Ibuprofen Other (See Comments) 05/03/2013  . Contrast media [iodinated diagnostic agents] Nausea And Vomiting 05/11/2013  . Pravachol Other (See Comments) 04/20/2011    Family History  Problem Relation Age of Onset  . Diabetes Mother   . Colon cancer Father   . Diabetes Brother   . Diabetes Sister   . Lung cancer Brother   . Cervical cancer Sister   . Thrombocytopenia Neg Hx   . Bleeding Disorder Neg Hx     Social History   Socioeconomic History  . Marital status: Divorced    Spouse name: Not on file  . Number of children: 2  . Years of education: Not on file  . Highest education level: Not on  file  Occupational History  . Occupation: RETIRED    Employer: RETIRED    CommentBuilding services engineer  Social Needs  . Financial resource strain: Not on file  . Food insecurity:    Worry: Not on file    Inability: Not on file  . Transportation needs:    Medical: Not on file    Non-medical: Not on file  Tobacco Use  . Smoking status: Never Smoker  . Smokeless tobacco: Never Used  Substance and Sexual Activity  . Alcohol use: No  . Drug use: No  . Sexual activity: Never    Birth control/protection: Post-menopausal, Surgical  Lifestyle  . Physical activity:    Days per week: Not on file    Minutes per session: Not on file  . Stress: Not on file  Relationships  . Social connections:    Talks on phone: Not on file    Gets together: Not on file    Attends religious service: Not on file    Active member of club or organization: Not on file    Attends meetings of clubs or organizations: Not on file    Relationship status: Not on file  . Intimate partner violence:    Fear of current or ex partner: Not on file    Emotionally abused: Not on file    Physically abused: Not on file    Forced sexual activity: Not on file  Other Topics Concern  . Not on  file  Social History Narrative   Widowed.  Lives alone.  Ambulates with a walker.    Review of Systems: All negative except as stated above in HPI.  Physical Exam: Vital signs: Vitals:   04/13/18 1200 04/13/18 1300  BP: (!) 146/81 (!) 149/93  Pulse: 81 89  Resp: (!) 23 19  Temp:    SpO2: 93% 93%   Last BM Date: 04/13/18 General:  Elderly and chronically ill-appearing patient. Not in acute distress. HEENT : normocephalic, atraumatic.Mucous members moist. Heart. RRR Lungs. Clear to auscultate. Anterior exam only. Abdomen. Soft, nontender, nondistended, bowel sounds present. LE : no significant edema GI:  Lab Results: Recent Labs    04/11/18 1156 04/12/18 0306 04/13/18 0313  WBC 8.9 6.9 7.1  HGB 9.0* 8.9* 8.7*  HCT 28.4* 28.1* 27.6*  PLT 50* 84* 130*   BMET Recent Labs    04/11/18 0306 04/12/18 0306 04/13/18 0313  NA 139 132* 138  K 3.9 4.3 4.1  CL 98* 96* 101  CO2 27 27 27   GLUCOSE 180* 308* 223*  BUN 27* 48* 50*  CREATININE 0.63 1.06* 0.92  CALCIUM 8.9 8.4* 8.4*   LFT Recent Labs    04/13/18 0313  PROT 8.8*  ALBUMIN 2.4*  AST 17  ALT 17  ALKPHOS 45  BILITOT 0.5   PT/INR No results for input(s): LABPROT, INR in the last 72 hours.   Studies/Results: No results found.  Impression/Plan: - recurrent rectal bleeding in setting of severe thrombocytopenia with platelet count of 6. - Acute ITP - atrial fibrillation. Eliquis on hold  - Multiple medical co morbidities including CHF, pulmonary hypertension, dementia   Recommendations ------------------------- - Continue supportive care for now. Management of ITP per hematology. - family  wants to pursue further workup for her rectal bleeding once acute issues are resolved.because of multiple comorbidities,I'm not sure she would be able to handle prep for colonoscopy as well as colonoscopy procedure itself. - patient lives in nursing home. Best  time to do procedure will be  during her hospital stay.  Consider flexible sigmoidoscopy once acute issues are resolved probably on Monday. - GI will follow   LOS: 4 days   Otis Brace  MD, FACP 04/13/2018, 2:51 PM  Contact #  [phone]

## 2018-04-13 NOTE — Progress Notes (Signed)
Hematology: Platelet Count: 130. If patient gets discharged over the weekend, please send her on steroid taper Dexamethasone 4 mg daily X 7 days Dexamethasone 2 mg daily X 14 days Dexamethasone 1 mg daily X 14 days  I will see her in 4 weeks and recheck her labs

## 2018-04-13 NOTE — Progress Notes (Signed)
Patient ID: [name], female   DOB: [birth year], 77 y.o.   MRN: [id number]                                                                PROGRESS NOTE                                                                                                                                                                                                             Patient Demographics:    [name], is a 77 y.o. female, DOB - July 11, 1941, JKK:[id number]  Admit date - 04/09/2018   Admitting Physician Toy Baker, MD  Outpatient Primary MD for the patient is [name], DO  LOS - 4  Outpatient Specialists:    Chief Complaint  Patient presents with  . Rectal Bleeding  . abnormal lab value    low platelet count       Brief Narrative  77 y.o.femalewith medical history significant of breast CA,diabetes mellitus type 2, COPD, OSA, chronic diastolic heart failure, persistent atrial fibrillation on Eliquis, dementia, and diverticulosis, possible ITPhistory of pulmonary hypertension   Presented withBlood in depends yesterday rechecked CBC and noted plt 3 brought to ER.Family noted diffuse rash synovitis persistent from prior patient's Eliquis has been restarted her last dose was today She has dementia at baseline per family currently appears to be at her baseline Last admittion for rectal bleeding 3 wks agoat that time she required transfusions of 2 packets of platelets to bleeding felt to be secondary to severe thrombocytopenia during that time her platelet count was5after receiving 2.Units of platelets count improved to 221 and bleeding resolved differential included ITP versus bone marrow failure hematology consulted recommended IVIG and Decadron. (on IV dexamethasone, 20 mg daily for 4 days and IVIG at the rate of 1 g/kg daily IV for x2 days)  Regarding pertinent Chronic problems:History of cancer status post lumpectomy 2012 and radiation therapy treated with  Aromasin  While in ER:  Noted to have platelets down to 6 globin is stable at 10.9  ER provider did not noticed bright red blood per rectum but stool is Hemoccult positive     Subjective:    [name] today has been afebrile.  Denies bleeding.  Pt has not had rectal bleeding since admission.  No  further bradycardia since initial episode 5/1 w ivig  No headache, No chest pain, No abdominal pain - No Nausea, No new weakness tingling or numbness, No Cough - SOB.   Assessment  & Plan :    Principal Problem:   Acute ITP ([place]) Active Problems:   Obstructive sleep apnea   Anemia   Breast cancer of lower-inner quadrant of right female breast (HCC)   Hyperlipidemia   Lower GI bleed   COPD (chronic obstructive pulmonary disease) (HCC)   Chronic atrial fibrillation (HCC)   Chronic diastolic CHF (congestive heart failure) ([place])   Hypertension   Diabetes mellitus type 2 in obese (HCC)   Dementia without behavioral disturbance   CKD (chronic kidney disease) stage 2, GFR 60-89 ml/min    Acute ITP (HCC) with the thrombocytopenia, anemia Platelets 6 at the time of admission, was transfused 2 units of platelets pheresis, improved to 15 K=> 130 -Continue dexamethasone , change from 20mg  iv qday to 10mg  I v qday (5/3) - IVIG= bradycardia 5/1, no further episode -appreciate oncology input Check cbc in am  Active Problems: Rectal bleedingHgb stable, no rectal bleeding since admission Secondary to #1, patient also has a history of diverticulosis,  Last colonoscopy 2010 by Laurence Spates Requested consult from [facility] GI per family request, appreciate input  Chronic diastolic CHF Currently compensated, restart lasixat 10mg  po qday (04/11/2018) Increase lasix to baseline at 20mg  po qday (04/12/2018)  Dementia Currently appears to be close to baseline, monitor for sundowning Continue Aricept  Hypertension Currently stable  Chronic atrial fibrillation Currently rate  controlled, Eliquis was held from the previous admission and patient had restarted it. Continue to hold anticoagulation    COPD No wheezing  Breast cancer of lower-inner quadrant of right female breast ([place]) Distant history, currently in remission  CKD (chronic kidney disease) stage 2, GFR 60-89 ml/min -Creatinine currently at baseline  Severe protein calorie malnutrition Start prostat 30 ml po bid  Dm2 Cont fsbs ac and qhs, Iss   Code Status:Full code  Family Communication :w patient  Disposition Plan:Remain stepdown  Barriers For Discharge:  Consults :oncology  Procedures :   DVT Prophylaxis: SCDs      Lab Results  Component Value Date   PLT 130 (L) 04/13/2018    Antibiotics  :  none  Anti-infectives (From admission, onward)   None        Objective:   Vitals:   04/13/18 0100 04/13/18 0200 04/13/18 0300 04/13/18 0315  BP: 127/90 (!) 144/98 (!) 140/102   Pulse: 69 74 89   Resp: (!) 26 (!) 24 19   Temp:    97.6 F (36.4 C)  TempSrc:    Oral  SpO2: 93% 93% 97%   Weight:      Height:        Wt Readings from Last 3 Encounters:  04/10/18 89.4 kg (197 lb 1.5 oz)  03/21/18 89.4 kg (197 lb 1.5 oz)  05/19/17 88.5 kg (195 lb)     Intake/Output Summary (Last 24 hours) at 04/13/2018 0519 Last data filed at 04/13/2018 0200 Gross per 24 hour  Intake 1063.93 ml  Output 2900 ml  Net -1836.07 ml     Physical Exam  Awake Alert, Oriented X 3, No new F.N deficits, Normal affect [place].AT,PERRAL Supple Neck,No JVD, No cervical lymphadenopathy appriciated.  Symmetrical Chest wall movement, Good air movement bilaterally, CTAB RRR,No Gallops,Rubs or new Murmurs, No Parasternal Heave +ve B.Sounds, Abd Soft, No tenderness, No organomegaly appriciated, No rebound -  guarding or rigidity. No Cyanosis, Clubbing or edema, No new Rash or bruise     Data Review:    CBC Recent Labs  Lab 04/09/18 1604  04/10/18 1826  04/11/18 0306 04/11/18 1156 04/12/18 0306 04/13/18 0313  WBC 9.9   < > 6.6 8.3 8.9 6.9 7.1  HGB 10.9*   < > 10.7* 9.9* 9.0* 8.9* 8.7*  HCT 35.1*   < > 33.6* 31.2* 28.4* 28.1* 27.6*  PLT 6*   < > 16* 31* 50* 84* 130*  MCV 80.5   < > 79.8 78.8 78.9 79.4 79.5  MCH 25.0*   < > 25.4* 25.0* 25.0* 25.1* 25.1*  MCHC 31.1   < > 31.8 31.7 31.7 31.7 31.5  RDW 15.3   < > 15.2 15.1 14.9 14.7 15.0  LYMPHSABS 1.3  --   --   --   --   --   --   MONOABS 0.6  --   --   --   --   --   --   EOSABS 0.1  --   --   --   --   --   --   BASOSABS 0.0  --   --   --   --   --   --    < > = values in this interval not displayed.    Chemistries  Recent Labs  Lab 04/09/18 1604 04/10/18 0548 04/10/18 1826 04/11/18 0306 04/12/18 0306 04/13/18 0313  NA 139 141 140 139 132* 138  K 4.4 4.0 4.4 3.9 4.3 4.1  CL 98* 99* 100* 98* 96* 101  CO2 30 32 30 27 27 27   GLUCOSE 177* 135* 185* 180* 308* 223*  BUN 23* 20 25* 27* 48* 50*  CREATININE 0.78 0.69 0.70 0.63 1.06* 0.92  CALCIUM 9.4 9.4 9.3 8.9 8.4* 8.4*  MG  --   --  1.6*  --   --   --   AST 19 16 18   --  19 17  ALT 13* 13* 17  --  16 17  ALKPHOS 55 55 53  --  51 45  BILITOT 0.4 0.7 0.7  --  0.6 0.5   ------------------------------------------------------------------------------------------------------------------ No results for input(s): CHOL, HDL, LDLCALC, TRIG, CHOLHDL, LDLDIRECT in the last 72 hours.  Lab Results  Component Value Date   HGBA1C 5.7 02/21/2017   ------------------------------------------------------------------------------------------------------------------ Recent Labs    04/10/18 1826  TSH 1.622   ------------------------------------------------------------------------------------------------------------------ No results for input(s): VITAMINB12, FOLATE, FERRITIN, TIBC, IRON, RETICCTPCT in the last 72 hours.  Coagulation profile Recent Labs  Lab 04/09/18 1604  INR 1.49    No results for input(s): DDIMER in the last 72  hours.  Cardiac Enzymes Recent Labs  Lab 04/11/18 1534 04/11/18 2050 04/12/18 0306  TROPONINI <0.03 <0.03 <0.03   ------------------------------------------------------------------------------------------------------------------    Component Value Date/Time   BNP 63.8 03/24/2014 1757    Inpatient Medications  Scheduled Meds: . acetaminophen  650 mg Oral Q24H  . dexamethasone  10 mg Intravenous Q24H  . diphenhydrAMINE  25 mg Oral Q24H  . donepezil  10 mg Oral QHS  . furosemide  10 mg Oral Daily  . insulin aspart  0-5 Units Subcutaneous QHS  . insulin aspart  0-9 Units Subcutaneous TID WC  . metoCLOPramide  5 mg Oral BID  . mometasone-formoterol  2 puff Inhalation BID  . venlafaxine XR  37.5 mg Oral Q breakfast   Continuous Infusions: PRN Meds:.acetaminophen **OR** acetaminophen, albuterol, HYDROcodone-acetaminophen, ondansetron **OR** ondansetron (  ZOFRAN) IV  Micro Results No results found for this or any previous visit (from the past 240 hour(s)).  Radiology Reports Dg Chest Port 1 View  Result Date: 03/18/2018 CLINICAL DATA:  Shortness of breath today EXAM: PORTABLE CHEST 1 VIEW COMPARISON:  October 05, 2015 FINDINGS: The heart size and mediastinal contours are within normal limits. Mild chronic increased pulmonary interstitium is identified bilaterally unchanged. There is no focal pneumonia or pleural effusion. The visualized skeletal structures are stable. IMPRESSION: No active cardiopulmonary disease. Electronically Signed   By: Abelardo Diesel M.D.   On: 03/18/2018 17:27    Time Spent in minutes  30   Jani Gravel M.D on 04/13/2018 at 5:19 AM  Between 7am to 7pm - Pager - [phone]   After 7pm go to [email or web address] - password [facility]  [facility] -  Office  [phone]

## 2018-04-13 NOTE — Care Management Note (Addendum)
Case Management Note  Patient Details  Name: [name] MRN: [id number] Date of Birth: 09-09-1941  Subjective/Objective:                  Rectal bleeding/hgb stable/?source of bleeding  Action/Plan: Date: Apr 13, 2018 Velva Harman, BSN, [place], [place] Chart and notes review for patient progress and needs. Will follow for case management and discharge needs. No cm or discharge needs present at time of this review./ possible safety in the home. Next review date: [id number]  Expected Discharge Date:  (UNKNOWN)               Expected Discharge Plan:     In-House Referral:     Discharge planning Services     Post Acute Care Choice:    Choice offered to:     DME Arranged:    DME Agency:     HH Arranged:    HH Agency:     Status of Service:     If discussed at [employer], dates discussed:    Additional Comments:  Leeroy Cha, RN 04/13/2018, 8:29 AM

## 2018-04-14 LAB — COMPREHENSIVE METABOLIC PANEL
ALT: 18 U/L (ref 14–54)
ANION GAP: 7 (ref 5–15)
AST: 22 U/L (ref 15–41)
Albumin: 2.5 g/dL — ABNORMAL LOW (ref 3.5–5.0)
Alkaline Phosphatase: 46 U/L (ref 38–126)
BUN: 51 mg/dL — ABNORMAL HIGH (ref 6–20)
CALCIUM: 8.1 mg/dL — AB (ref 8.9–10.3)
CO2: 26 mmol/L (ref 22–32)
CREATININE: 0.86 mg/dL (ref 0.44–1.00)
Chloride: 101 mmol/L (ref 101–111)
Glucose, Bld: 237 mg/dL — ABNORMAL HIGH (ref 65–99)
POTASSIUM: 4.5 mmol/L (ref 3.5–5.1)
SODIUM: 134 mmol/L — AB (ref 135–145)
Total Bilirubin: 0.5 mg/dL (ref 0.3–1.2)
Total Protein: 8.5 g/dL — ABNORMAL HIGH (ref 6.5–8.1)

## 2018-04-14 LAB — GLUCOSE, CAPILLARY
GLUCOSE-CAPILLARY: 193 mg/dL — AB (ref 65–99)
Glucose-Capillary: 234 mg/dL — ABNORMAL HIGH (ref 65–99)
Glucose-Capillary: 211 mg/dL — ABNORMAL HIGH (ref 65–99)
Glucose-Capillary: 214 mg/dL — ABNORMAL HIGH (ref 65–99)
Glucose-Capillary: 180 mg/dL — ABNORMAL HIGH (ref 65–99)

## 2018-04-14 LAB — CBC
HCT: 29.7 % — ABNORMAL LOW (ref 36.0–46.0)
Hemoglobin: 9.3 g/dL — ABNORMAL LOW (ref 12.0–15.0)
MCH: 25.1 pg — AB (ref 26.0–34.0)
MCHC: 31.3 g/dL (ref 30.0–36.0)
MCV: 80.3 fL (ref 78.0–100.0)
PLATELETS: 171 10*3/uL (ref 150–400)
RBC: 3.7 MIL/uL — ABNORMAL LOW (ref 3.87–5.11)
RDW: 15.2 % (ref 11.5–15.5)
WBC: 8.1 10*3/uL (ref 4.0–10.5)

## 2018-04-14 NOTE — Progress Notes (Signed)
[facility] Progress Note  ANNALAURA SAUSEDA 76 y.o. 1941-08-29  CC:  GI bleed/rectal bleeding  Subjective: no acute events overnight. Son at bedside. No further evidence of bleeding.  ROS :not able to obtain   Objective: Vital signs in last 24 hours: Vitals:   04/14/18 0719 04/14/18 0800  BP:    Pulse:    Resp:    Temp:  (!) 97.5 F (36.4 C)  SpO2: 99%     Physical Exam:  Abdomen. Soft, nontender, nondistended, bowel sounds present.  Lab Results: Recent Labs    04/13/18 0313 04/14/18 0321  NA 138 134*  K 4.1 4.5  CL 101 101  CO2 27 26  GLUCOSE 223* 237*  BUN 50* 51*  CREATININE 0.92 0.86  CALCIUM 8.4* 8.1*   Recent Labs    04/13/18 0313 04/14/18 0321  AST 17 22  ALT 17 18  ALKPHOS 45 46  BILITOT 0.5 0.5  PROT 8.8* 8.5*  ALBUMIN 2.4* 2.5*   Recent Labs    04/13/18 0313 04/14/18 0321  WBC 7.1 8.1  HGB 8.7* 9.3*  HCT 27.6* 29.7*  MCV 79.5 80.3  PLT 130* 171   No results for input(s): LABPROT, INR in the last 72 hours.    Assessment/Plan: - recurrent rectal bleeding in setting of severe thrombocytopenia with platelet count of 6. - Acute ITP : resolved. Platelet counts normal now.Management of ITP per hematology. - atrial fibrillation. Eliquis on hold  - Multiple medical co morbidities including CHF, pulmonary hypertension, dementia   Recommendations ------------------------- - patient's hemoglobin stable.No evidence of ongoing rectal bleeding. Platelet count improved to normal. - discussed with patient's family again. Plan for flexible sigmoidoscopy tentatively on Monday. - Continue supportive care for now.   - family  wants to pursue further workup for her rectal bleeding once acute issues are resolved. Because of multiple comorbidities,I'm not sure she would be able to handle prep for colonoscopy as well as colonoscopy procedure itself. - patient lives in nursing home. Best  time to do procedure will be during her hospital stay.   - GI will follow     Otis Brace MD, [place] 04/14/2018, 11:36 AM  Contact #  [phone]

## 2018-04-14 NOTE — Progress Notes (Signed)
Patient ID: [name], female   DOB: [birth year], 76 y.o.   MRN: [id number]                                                                PROGRESS NOTE                                                                                                                                                                                                             Patient Demographics:    [name], is a 77 y.o. female, DOB - 10-21-1941, [id number]  Admit date - 04/09/2018   Admitting Physician Sharon Baker, MD  Outpatient Primary MD for the patient is Sharon Morning, DO  LOS - 5  Outpatient Specialists:     Chief Complaint  Patient presents with  . Rectal Bleeding  . abnormal lab value    low platelet count       Brief Narrative    77 y.o.femalewith medical history significant of breast CA,diabetes mellitus type 2, COPD, OSA, chronic diastolic heart failure, persistent atrial fibrillation on Eliquis, dementia, and diverticulosis, possible ITPhistory of pulmonary hypertension   Presented withBlood in depends yesterday rechecked CBC and noted plt 3 brought to ER.Family noted diffuse rash synovitis persistent from prior patient's Eliquis has been restarted her last dose was today She has dementia at baseline per family currently appears to be at her baseline Last admittion for rectal bleeding 3 wks agoat that time she required transfusions of 2 packets of platelets to bleeding felt to be secondary to severe thrombocytopenia during that time her platelet count was5after receiving 2.Units of platelets count improved to 221 and bleeding resolved differential included ITP versus bone marrow failure hematology consulted recommended IVIG and Decadron. (on IV dexamethasone, 20 mg daily for 4 days and IVIG at the rate of 1 g/kg daily IV for x2 days)  Regarding pertinent Chronic problems:History of cancer status post lumpectomy 2012 and radiation therapy treated with  Aromasin  While in ER:  Noted to have platelets down to 6 globin is stable at 10.9  ER provider did not noticed bright red blood per rectum but stool is Hemoccult positive      Subjective:    [name] today states no brbpr.  Doing well denies fever, chills, cp, palp, sob, n/v,  diarrhea, brbpr, black stool. Appears to have eaten all her lunch.     Assessment  & Plan :    Principal Problem:   Acute ITP ([name]) Active Problems:   Obstructive sleep apnea   Anemia   Breast cancer of lower-inner quadrant of right female breast (HCC)   Hyperlipidemia   Lower GI bleed   COPD (chronic obstructive pulmonary disease) (HCC)   Chronic atrial fibrillation (HCC)   Chronic diastolic CHF (congestive heart failure) ([name])   Hypertension   Diabetes mellitus type 2 in obese (HCC)   Dementia without behavioral disturbance   CKD (chronic kidney disease) stage 2, GFR 60-89 ml/min     Acute ITP (HCC) with the thrombocytopenia, anemia Platelets 6 at the time of admission, was transfused 2 units of platelets pheresis, improved to 15 K=> 170 -Continue dexamethasone , change from 20mg  iv qday to 10mg  I v qday (5/3) - IVIG= bradycardia 5/1, no further episode -appreciate oncology input Check cbc in am  Active Problems: Rectal bleedingHgb stable, no rectal bleeding since admission Secondary to #1, patient also has a history of diverticulosis,  Last colonoscopy 2010, 2014 by [name] Gastroenterology consult, flex sig for Monday, appreciate input  Chronic diastolic CHF Currently compensated, restart lasixat 10mg  po qday (04/11/2018) Increase lasix to baseline at 20mg  po qday (04/12/2018)  Dementia Currently appears to be close to baseline, monitor for sundowning Continue Aricept  Hypertension Currently stable  Chronic atrial fibrillation Currently rate controlled, Eliquis was held from the previous admission and patient had restarted it. Continue to hold  anticoagulation    COPD No wheezing  Breast cancer of lower-inner quadrant of right female breast ([name]) Distant history, currently in remission  CKD (chronic kidney disease) stage 2, GFR 60-89 ml/min -Creatinine currently at baseline  Severe protein calorie malnutrition Start prostat 30 ml po bid (5/3)  Dm2 Cont fsbs ac and qhs, Iss   Code Status:Full code  Family Communication :w patient  Disposition Plan:Remain stepdown  Barriers For Discharge:  Consults :oncology, gastroenterology  Procedures :   DVT Prophylaxis: SCDs       Lab Results  Component Value Date   PLT 171 04/14/2018    Antibiotics  :  none  Anti-infectives (From admission, onward)   None        Objective:   Vitals:   04/14/18 0345 04/14/18 0719 04/14/18 0800 04/14/18 1200  BP:    (!) 158/78  Pulse:    68  Resp:    19  Temp: (!) 96.6 F (35.9 C)  (!) 97.5 F (36.4 C)   TempSrc: Axillary  Oral   SpO2:  99%  96%  Weight:      Height:        Wt Readings from Last 3 Encounters:  04/10/18 89.4 kg (197 lb 1.5 oz)  03/21/18 89.4 kg (197 lb 1.5 oz)  05/19/17 88.5 kg (195 lb)     Intake/Output Summary (Last 24 hours) at 04/14/2018 1258 Last data filed at 04/14/2018 1000 Gross per 24 hour  Intake 240 ml  Output 3700 ml  Net -3460 ml     Physical Exam  Awake Alert, Oriented X 3, No new F.N deficits, Normal affect [name].AT,PERRAL Supple Neck,No JVD, No cervical lymphadenopathy appriciated.  Symmetrical Chest wall movement, Good air movement bilaterally, CTAB RRR,No Gallops,Rubs or new Murmurs, No Parasternal Heave +ve B.Sounds, Abd Soft, No tenderness, No organomegaly appriciated, No rebound - guarding or rigidity. No Cyanosis, Clubbing or edema, No new Rash or  bruise       Data Review:    CBC Recent Labs  Lab 04/09/18 1604  04/11/18 0306 04/11/18 1156 04/12/18 0306 04/13/18 0313 04/14/18 0321  WBC 9.9   < > 8.3 8.9 6.9 7.1 8.1    HGB 10.9*   < > 9.9* 9.0* 8.9* 8.7* 9.3*  HCT 35.1*   < > 31.2* 28.4* 28.1* 27.6* 29.7*  PLT 6*   < > 31* 50* 84* 130* 171  MCV 80.5   < > 78.8 78.9 79.4 79.5 80.3  MCH 25.0*   < > 25.0* 25.0* 25.1* 25.1* 25.1*  MCHC 31.1   < > 31.7 31.7 31.7 31.5 31.3  RDW 15.3   < > 15.1 14.9 14.7 15.0 15.2  LYMPHSABS 1.3  --   --   --   --   --   --   MONOABS 0.6  --   --   --   --   --   --   EOSABS 0.1  --   --   --   --   --   --   BASOSABS 0.0  --   --   --   --   --   --    < > = values in this interval not displayed.    Chemistries  Recent Labs  Lab 04/10/18 0548 04/10/18 1826 04/11/18 0306 04/12/18 0306 04/13/18 0313 04/14/18 0321  NA 141 140 139 132* 138 134*  K 4.0 4.4 3.9 4.3 4.1 4.5  CL 99* 100* 98* 96* 101 101  CO2 32 30 27 27 27 26   GLUCOSE 135* 185* 180* 308* 223* 237*  BUN 20 25* 27* 48* 50* 51*  CREATININE 0.69 0.70 0.63 1.06* 0.92 0.86  CALCIUM 9.4 9.3 8.9 8.4* 8.4* 8.1*  MG  --  1.6*  --   --   --   --   AST 16 18  --  19 17 22   ALT 13* 17  --  16 17 18   ALKPHOS 55 53  --  51 45 46  BILITOT 0.7 0.7  --  0.6 0.5 0.5   ------------------------------------------------------------------------------------------------------------------ No results for input(s): CHOL, HDL, LDLCALC, TRIG, CHOLHDL, LDLDIRECT in the last 72 hours.  Lab Results  Component Value Date   HGBA1C 5.7 02/21/2017   ------------------------------------------------------------------------------------------------------------------ No results for input(s): TSH, T4TOTAL, T3FREE, THYROIDAB in the last 72 hours.  Invalid input(s): FREET3 ------------------------------------------------------------------------------------------------------------------ No results for input(s): VITAMINB12, FOLATE, FERRITIN, TIBC, IRON, RETICCTPCT in the last 72 hours.  Coagulation profile Recent Labs  Lab 04/09/18 1604  INR 1.49    No results for input(s): DDIMER in the last 72 hours.  Cardiac Enzymes Recent  Labs  Lab 04/11/18 1534 04/11/18 2050 04/12/18 0306  TROPONINI <0.03 <0.03 <0.03   ------------------------------------------------------------------------------------------------------------------    Component Value Date/Time   BNP 63.8 03/24/2014 1757    Inpatient Medications  Scheduled Meds: . acetaminophen  650 mg Oral Q24H  . dexamethasone  10 mg Intravenous Q24H  . diphenhydrAMINE  25 mg Oral Q24H  . donepezil  10 mg Oral QHS  . feeding supplement (PRO-STAT SUGAR FREE 64)  30 mL Oral BID  . furosemide  10 mg Oral Daily  . insulin aspart  0-5 Units Subcutaneous QHS  . insulin aspart  0-9 Units Subcutaneous TID WC  . mouth rinse  15 mL Mouth Rinse BID  . metoCLOPramide  5 mg Oral BID  . mometasone-formoterol  2 puff Inhalation BID  . venlafaxine XR  37.5 mg Oral Q breakfast   Continuous Infusions: PRN Meds:.acetaminophen **OR** acetaminophen, albuterol, HYDROcodone-acetaminophen, ondansetron **OR** ondansetron (ZOFRAN) IV  Micro Results No results found for this or any previous visit (from the past 240 hour(s)).  Radiology Reports Dg Chest Port 1 View  Result Date: 03/18/2018 CLINICAL DATA:  Shortness of breath today EXAM: PORTABLE CHEST 1 VIEW COMPARISON:  October 05, 2015 FINDINGS: The heart size and mediastinal contours are within normal limits. Mild chronic increased pulmonary interstitium is identified bilaterally unchanged. There is no focal pneumonia or pleural effusion. The visualized skeletal structures are stable. IMPRESSION: No active cardiopulmonary disease. Electronically Signed   By: Abelardo Diesel M.D.   On: 03/18/2018 17:27    Time Spent in minutes  30   Jani Gravel M.D on 04/14/2018 at 12:58 PM  Between 7am to 7pm - Pager - [phone]    After 7pm go to [email or web address] - password [facility]  [facility] -  Office  [phone]

## 2018-04-15 LAB — CBC
HEMATOCRIT: 32.3 % — AB (ref 36.0–46.0)
HEMOGLOBIN: 10 g/dL — AB (ref 12.0–15.0)
MCH: 25 pg — AB (ref 26.0–34.0)
MCHC: 31 g/dL (ref 30.0–36.0)
MCV: 80.8 fL (ref 78.0–100.0)
Platelets: 211 10*3/uL (ref 150–400)
RBC: 4 MIL/uL (ref 3.87–5.11)
RDW: 15.4 % (ref 11.5–15.5)
WBC: 7.4 10*3/uL (ref 4.0–10.5)

## 2018-04-15 LAB — COMPREHENSIVE METABOLIC PANEL
ALBUMIN: 2.4 g/dL — AB (ref 3.5–5.0)
ALT: 17 U/L (ref 14–54)
ANION GAP: 9 (ref 5–15)
AST: 20 U/L (ref 15–41)
Alkaline Phosphatase: 41 U/L (ref 38–126)
BILIRUBIN TOTAL: 0.5 mg/dL (ref 0.3–1.2)
BUN: 51 mg/dL — ABNORMAL HIGH (ref 6–20)
CO2: 26 mmol/L (ref 22–32)
Calcium: 8.2 mg/dL — ABNORMAL LOW (ref 8.9–10.3)
Chloride: 99 mmol/L — ABNORMAL LOW (ref 101–111)
Creatinine, Ser: 0.79 mg/dL (ref 0.44–1.00)
GFR calc Af Amer: >60 mL/min (ref >60)
GLUCOSE: 286 mg/dL — AB (ref 65–99)
POTASSIUM: 4.8 mmol/L (ref 3.5–5.1)
Sodium: 134 mmol/L — ABNORMAL LOW (ref 135–145)
TOTAL PROTEIN: 7.9 g/dL (ref 6.5–8.1)

## 2018-04-15 LAB — GLUCOSE, CAPILLARY
GLUCOSE-CAPILLARY: 213 mg/dL — AB (ref 65–99)
Glucose-Capillary: 245 mg/dL — ABNORMAL HIGH (ref 65–99)
Glucose-Capillary: 269 mg/dL — ABNORMAL HIGH (ref 65–99)
Glucose-Capillary: 229 mg/dL — ABNORMAL HIGH (ref 65–99)

## 2018-04-15 MED ORDER — DILTIAZEM HCL 30 MG PO TABS
15.0000 mg | ORAL_TABLET | Freq: Three times a day (TID) | ORAL | Status: DC
  Administered 2018-04-15 – 2018-04-17 (×8): 15 mg via ORAL
  Filled 2018-04-15 (×8): qty 1

## 2018-04-15 MED ORDER — FLEET ENEMA 7-19 GM/118ML RE ENEM
1.0000 | ENEMA | Freq: Once | RECTAL | Status: AC
  Administered 2018-04-16: 1 via RECTAL
  Filled 2018-04-15: qty 1

## 2018-04-15 MED ORDER — AMLODIPINE BESYLATE 5 MG PO TABS: 2.5000 mg | ORAL_TABLET | Freq: Every day | ORAL | Status: DC

## 2018-04-15 NOTE — H&P (View-Only) (Signed)
[facility] Progress Note  [name] 77 y.o. 07-20-1941  CC:  GI bleed/rectal bleeding  Subjective: no acute events overnight. Son at bedside. No further evidence of bleeding. Eating lunch without any discomfort     Objective: Vital signs in last 24 hours: Vitals:   04/15/18 0820 04/15/18 1002  BP: (!) 162/84 (!) 150/90  Pulse: 64   Resp: 18   Temp:    SpO2: 97%     Physical Exam: GEN : A/O X 3. NAD  Abdomen. Soft, nontender, nondistended, bowel sounds present.  Lab Results: Recent Labs    04/14/18 0321 04/15/18 0327  NA 134* 134*  K 4.5 4.8  CL 101 99*  CO2 26 26  GLUCOSE 237* 286*  BUN 51* 51*  CREATININE 0.86 0.79  CALCIUM 8.1* 8.2*   Recent Labs    04/14/18 0321 04/15/18 0327  AST 22 20  ALT 18 17  ALKPHOS 46 41  BILITOT 0.5 0.5  PROT 8.5* 7.9  ALBUMIN 2.5* 2.4*   Recent Labs    04/14/18 0321 04/15/18 0327  WBC 8.1 7.4  HGB 9.3* 10.0*  HCT 29.7* 32.3*  MCV 80.3 80.8  PLT 171 211   No results for input(s): LABPROT, INR in the last 72 hours.    Assessment/Plan: - recurrent rectal bleeding in setting of severe thrombocytopenia with platelet count of 6. Rectal bleeding resolved now.  - Acute ITP : resolved. Platelet counts normal now.Management of ITP per hematology. - atrial fibrillation. Eliquis on hold  - Multiple medical co morbidities including CHF, pulmonary hypertension, dementia   Recommendations ------------------------- - patient's hemoglobin stable.No evidence of ongoing rectal bleeding. Platelet count improved to normal. - family  wants to pursue further workup for her rectal bleeding  Because of multiple comorbidities,I'm not sure she would be able to handle prep for colonoscopy as well as colonoscopy procedure itself. - patient lives in nursing home. Best  time to do procedure will be during her hospital stay.   - Plan for un sedated  flexible sigmoidoscopy tomorrow . Eliquis on hold   Risks (bleeding,  infection, bowel perforation that could require surgery, changes in cardiopulmonary systems), benefits (identification and possible treatment of source of symptoms, exclusion of certain causes of symptoms), and alternatives (watchful waiting, radiographic imaging studies, empiric medical treatment)  were explained to patient and family in detail and patient wishes to proceed.  - GI will follow     Otis Brace MD, [place] 04/15/2018, 12:07 PM  Contact #  [phone]

## 2018-04-15 NOTE — Progress Notes (Signed)
[facility] Progress Note  [name] 77 y.o. 05/27/1941  CC:  GI bleed/rectal bleeding  Subjective: no acute events overnight. Son at bedside. No further evidence of bleeding. Eating lunch without any discomfort     Objective: Vital signs in last 24 hours: Vitals:   04/15/18 0820 04/15/18 1002  BP: (!) 162/84 (!) 150/90  Pulse: 64   Resp: 18   Temp:    SpO2: 97%     Physical Exam: GEN : A/O X 3. NAD  Abdomen. Soft, nontender, nondistended, bowel sounds present.  Lab Results: Recent Labs    04/14/18 0321 04/15/18 0327  NA 134* 134*  K 4.5 4.8  CL 101 99*  CO2 26 26  GLUCOSE 237* 286*  BUN 51* 51*  CREATININE 0.86 0.79  CALCIUM 8.1* 8.2*   Recent Labs    04/14/18 0321 04/15/18 0327  AST 22 20  ALT 18 17  ALKPHOS 46 41  BILITOT 0.5 0.5  PROT 8.5* 7.9  ALBUMIN 2.5* 2.4*   Recent Labs    04/14/18 0321 04/15/18 0327  WBC 8.1 7.4  HGB 9.3* 10.0*  HCT 29.7* 32.3*  MCV 80.3 80.8  PLT 171 211   No results for input(s): LABPROT, INR in the last 72 hours.    Assessment/Plan: - recurrent rectal bleeding in setting of severe thrombocytopenia with platelet count of 6. Rectal bleeding resolved now.  - Acute ITP : resolved. Platelet counts normal now.Management of ITP per hematology. - atrial fibrillation. Eliquis on hold  - Multiple medical co morbidities including CHF, pulmonary hypertension, dementia   Recommendations ------------------------- - patient's hemoglobin stable.No evidence of ongoing rectal bleeding. Platelet count improved to normal. - family  wants to pursue further workup for her rectal bleeding  Because of multiple comorbidities,I'm not sure she would be able to handle prep for colonoscopy as well as colonoscopy procedure itself. - patient lives in nursing home. Best  time to do procedure will be during her hospital stay.   - Plan for un sedated  flexible sigmoidoscopy tomorrow . Eliquis on hold   Risks (bleeding,  infection, bowel perforation that could require surgery, changes in cardiopulmonary systems), benefits (identification and possible treatment of source of symptoms, exclusion of certain causes of symptoms), and alternatives (watchful waiting, radiographic imaging studies, empiric medical treatment)  were explained to patient and family in detail and patient wishes to proceed.  - GI will follow     Otis Brace MD, [place] 04/15/2018, 12:07 PM  Contact #  [phone]

## 2018-04-15 NOTE — Progress Notes (Signed)
Patient ID: [name], female   DOB: 10-May-1941, 77 y.o.   MRN: [id number]                                                                PROGRESS NOTE                                                                                                                                                                                                             Patient Demographics:    [name], is a 77 y.o. female, DOB - 1941-01-08, WUJ:[id number]  Admit date - 04/09/2018   Admitting Physician Toy Baker, MD  Outpatient Primary MD for the patient is [name], DO  LOS - 6  Outpatient Specialists:   [facility] Sp[facility])  Chief Complaint  Patient presents with  . Rectal Bleeding  . abnormal lab value    low platelet count       Brief Narrative     77 y.o.femalewith medical history significant of breast CA,diabetes mellitus type 2, COPD, OSA, chronic diastolic heart failure, persistent atrial fibrillation on Eliquis, dementia, and diverticulosis, possible ITPhistory of pulmonary hypertension   Presented withBlood in depends yesterday rechecked CBC and noted plt 3 brought to ER.Family noted diffuse rash synovitis persistent from prior patient's Eliquis has been restarted her last dose was today She has dementia at baseline per family currently appears to be at her baseline Last admittion for rectal bleeding 3 wks agoat that time she required transfusions of 2 packets of platelets to bleeding felt to be secondary to severe thrombocytopenia during that time her platelet count was5after receiving 2.Units of platelets count improved to 221 and bleeding resolved differential included ITP versus bone marrow failure hematology consulted recommended IVIG and Decadron. (on IV dexamethasone, 20 mg daily for 4 days and IVIG at the rate of 1 g/kg daily IV for x2 days)  Regarding pertinent Chronic problems:History of cancer status post lumpectomy 2012 and radiation  therapy treated with Aromasin  While in ER:  Noted to have platelets down to 6 globin is stable at 10.9  ER provider did not noticed bright red blood per rectum but stool is Hemoccult positive     Subjective:    [name] today is doing well.  No rectal bleeding since being admitted.  Her platelets are responding to therapy.  Denies fever, chills, cp, palp, sob, n/v, diarrhea, brbpr, black stool.     No headache, No chest pain, No abdominal pain - No Nausea, No new weakness tingling or numbness, No Cough - SOB.    Assessment  & Plan :    Principal Problem:   Acute ITP ([place]) Active Problems:   Obstructive sleep apnea   Anemia   Breast cancer of lower-inner quadrant of right female breast (HCC)   Hyperlipidemia   Lower GI bleed   COPD (chronic obstructive pulmonary disease) (HCC)   Chronic atrial fibrillation (HCC)   Chronic diastolic CHF (congestive heart failure) ([place])   Hypertension   Diabetes mellitus type 2 in obese (HCC)   Dementia without behavioral disturbance   CKD (chronic kidney disease) stage 2, GFR 60-89 ml/min    Acute ITP (HCC) with the thrombocytopenia, anemia Platelets 6 at the time of admission, was transfused 2 units of platelets pheresis, improved to 15 K=>170 -Continue dexamethasone, change from 20mg  iv qday to 10mg  I v qday (5/3) - IVIG= bradycardia 5/1, no further episode -appreciate oncology input Check cbc in am  Active Problems: Rectal bleedingHgb stable, no rectal bleeding since admission Secondary to #1, patient also has a history of diverticulosis,  Last colonoscopy 2010, 2014 by Laurence Spates Gastroenterology consult, flex sig for Monday, appreciate input NPO after MN except for medication  Chronic diastolic CHF Currently compensated, restart lasixat 10mg  po qday (04/11/2018) Increase lasix to baseline at 20mg  po qday (04/12/2018) Check cmp in am  Dementia Currently appears to be close to baseline, monitor for  sundowning Continue Aricept  Hypertension Start cardizem 15mg  po tid for rate control and bp control (5/5)  Chronic atrial fibrillation Currently rate controlled, Eliquis was held from the previous admission and patient had restarted it. Hr in the 80-100's Restart cardizem 15mg  po tid Continue to hold anticoagulation , will discuss with oncology tomorrow regarding restarting    COPD No wheezing Incentive spirometry  Breast cancer of lower-inner quadrant of right female breast ([place]) Distant history, currently in remission  CKD (chronic kidney disease) stage 2, GFR 60-89 ml/min Creatinine currently at baseline  Severe protein calorie malnutrition Started prostat 30 ml po bid (5/3)  Dm2 Cont fsbs ac and qhs, Iss Will add lantus tomorrow after Flex sig done  Code Status:Full code  Family Communication :w patient  Disposition Plan:Remain stepdown  Barriers For Discharge:  Consults :oncology, gastroenterology  Procedures :   DVT Prophylaxis: SCDs        Lab Results  Component Value Date   PLT 211 04/15/2018    Antibiotics  :  none  Anti-infectives (From admission, onward)   None        Objective:   Vitals:   04/15/18 0600 04/15/18 0700 04/15/18 0728 04/15/18 0820  BP: (!) 166/98   (!) 162/84  Pulse: 79   64  Resp: 20   18  Temp:  97.8 F (36.6 C)    TempSrc:  Oral    SpO2: 93%  95% 97%  Weight:      Height:        Wt Readings from Last 3 Encounters:  04/10/18 89.4 kg (197 lb 1.5 oz)  03/21/18 89.4 kg (197 lb 1.5 oz)  05/19/17 88.5 kg (195 lb)     Intake/Output Summary (Last 24 hours) at 04/15/2018 0922 Last data filed at 04/14/2018 1800 Gross per 24 hour  Intake 720 ml  Output 1400 ml  Net -680 ml     Physical Exam  Awake Alert, Oriented X 3, No new F.N deficits, Normal affect [place].AT,PERRAL Supple Neck,No JVD, No cervical lymphadenopathy appriciated.  Symmetrical Chest wall movement, Good  air movement bilaterally, CTAB Irr, irr, s1, s2 +ve B.Sounds, Abd Soft, No tenderness, No organomegaly appriciated, No rebound - guarding or rigidity. No Cyanosis, Clubbing or edema, No new Rash or bruising, has had bruising on forearms since admission     Data Review:    CBC Recent Labs  Lab 04/09/18 1604  04/11/18 1156 04/12/18 0306 04/13/18 0313 04/14/18 0321 04/15/18 0327  WBC 9.9   < > 8.9 6.9 7.1 8.1 7.4  HGB 10.9*   < > 9.0* 8.9* 8.7* 9.3* 10.0*  HCT 35.1*   < > 28.4* 28.1* 27.6* 29.7* 32.3*  PLT 6*   < > 50* 84* 130* 171 211  MCV 80.5   < > 78.9 79.4 79.5 80.3 80.8  MCH 25.0*   < > 25.0* 25.1* 25.1* 25.1* 25.0*  MCHC 31.1   < > 31.7 31.7 31.5 31.3 31.0  RDW 15.3   < > 14.9 14.7 15.0 15.2 15.4  LYMPHSABS 1.3  --   --   --   --   --   --   MONOABS 0.6  --   --   --   --   --   --   EOSABS 0.1  --   --   --   --   --   --   BASOSABS 0.0  --   --   --   --   --   --    < > = values in this interval not displayed.    Chemistries  Recent Labs  Lab 04/10/18 1826 04/11/18 0306 04/12/18 0306 04/13/18 0313 04/14/18 0321 04/15/18 0327  NA 140 139 132* 138 134* 134*  K 4.4 3.9 4.3 4.1 4.5 4.8  CL 100* 98* 96* 101 101 99*  CO2 30 27 27 27 26 26   GLUCOSE 185* 180* 308* 223* 237* 286*  BUN 25* 27* 48* 50* 51* 51*  CREATININE 0.70 0.63 1.06* 0.92 0.86 0.79  CALCIUM 9.3 8.9 8.4* 8.4* 8.1* 8.2*  MG 1.6*  --   --   --   --   --   AST 18  --  19 17 22 20   ALT 17  --  16 17 18 17   ALKPHOS 53  --  51 45 46 41  BILITOT 0.7  --  0.6 0.5 0.5 0.5   ------------------------------------------------------------------------------------------------------------------ No results for input(s): CHOL, HDL, LDLCALC, TRIG, CHOLHDL, LDLDIRECT in the last 72 hours.  Lab Results  Component Value Date   HGBA1C 5.7 02/21/2017   ------------------------------------------------------------------------------------------------------------------ No results for input(s): TSH, T4TOTAL, T3FREE,  THYROIDAB in the last 72 hours.  Invalid input(s): FREET3 ------------------------------------------------------------------------------------------------------------------ No results for input(s): VITAMINB12, FOLATE, FERRITIN, TIBC, IRON, RETICCTPCT in the last 72 hours.  Coagulation profile Recent Labs  Lab 04/09/18 1604  INR 1.49    No results for input(s): DDIMER in the last 72 hours.  Cardiac Enzymes Recent Labs  Lab 04/11/18 1534 04/11/18 2050 04/12/18 0306  TROPONINI <0.03 <0.03 <0.03   ------------------------------------------------------------------------------------------------------------------    Component Value Date/Time   BNP 63.8 03/24/2014 1757    Inpatient Medications  Scheduled Meds: . acetaminophen  650 mg Oral Q24H  . dexamethasone  10 mg Intravenous Q24H  . diphenhydrAMINE  25 mg Oral Q24H  . donepezil  10 mg Oral  QHS  . feeding supplement (PRO-STAT SUGAR FREE 64)  30 mL Oral BID  . furosemide  10 mg Oral Daily  . insulin aspart  0-5 Units Subcutaneous QHS  . insulin aspart  0-9 Units Subcutaneous TID WC  . mouth rinse  15 mL Mouth Rinse BID  . metoCLOPramide  5 mg Oral BID  . mometasone-formoterol  2 puff Inhalation BID  . venlafaxine XR  37.5 mg Oral Q breakfast   Continuous Infusions: PRN Meds:.acetaminophen **OR** acetaminophen, albuterol, HYDROcodone-acetaminophen, ondansetron **OR** ondansetron (ZOFRAN) IV  Micro Results No results found for this or any previous visit (from the past 240 hour(s)).  Radiology Reports Dg Chest Port 1 View  Result Date: 03/18/2018 CLINICAL DATA:  Shortness of breath today EXAM: PORTABLE CHEST 1 VIEW COMPARISON:  October 05, 2015 FINDINGS: The heart size and mediastinal contours are within normal limits. Mild chronic increased pulmonary interstitium is identified bilaterally unchanged. There is no focal pneumonia or pleural effusion. The visualized skeletal structures are stable. IMPRESSION: No active  cardiopulmonary disease. Electronically Signed   By: Abelardo Diesel M.D.   On: 03/18/2018 17:27    Time Spent in minutes  30   Jani Gravel M.D on 04/15/2018 at 9:22 AM  Between 7am to 7pm - Pager - [phone]    After 7pm go to [email or web address] - password [facility]  [facility] -  Office  [phone]

## 2018-04-16 ENCOUNTER — Encounter (HOSPITAL_COMMUNITY)
Admission: EM | Disposition: A | Discharge status: Skilled Nursing Facility | Payer: Self-pay | Source: Home / Self Care | Attending: Internal Medicine

## 2018-04-16 ENCOUNTER — Encounter (HOSPITAL_COMMUNITY): Payer: Self-pay

## 2018-04-16 HISTORY — PX: FLEXIBLE SIGMOIDOSCOPY: SHX5431

## 2018-04-16 LAB — CBC
HCT: 32.8 % — ABNORMAL LOW (ref 36.0–46.0)
HEMOGLOBIN: 10.2 g/dL — AB (ref 12.0–15.0)
MCH: 25.1 pg — ABNORMAL LOW (ref 26.0–34.0)
MCHC: 31.1 g/dL (ref 30.0–36.0)
MCV: 80.6 fL (ref 78.0–100.0)
PLATELETS: 241 10*3/uL (ref 150–400)
RBC: 4.07 MIL/uL (ref 3.87–5.11)
RDW: 15.6 % — ABNORMAL HIGH (ref 11.5–15.5)
WBC: 8.2 10*3/uL (ref 4.0–10.5)

## 2018-04-16 LAB — COMPREHENSIVE METABOLIC PANEL
ALBUMIN: 2.3 g/dL — AB (ref 3.5–5.0)
ALK PHOS: 44 U/L (ref 38–126)
ALT: 14 U/L (ref 14–54)
ANION GAP: 10 (ref 5–15)
AST: 16 U/L (ref 15–41)
BUN: 55 mg/dL — ABNORMAL HIGH (ref 6–20)
CALCIUM: 8.4 mg/dL — AB (ref 8.9–10.3)
CHLORIDE: 101 mmol/L (ref 101–111)
CO2: 25 mmol/L (ref 22–32)
Creatinine, Ser: 0.77 mg/dL (ref 0.44–1.00)
GFR calc Af Amer: >60 mL/min (ref >60)
GFR calc non Af Amer: >60 mL/min (ref >60)
GLUCOSE: 326 mg/dL — AB (ref 65–99)
POTASSIUM: 4.8 mmol/L (ref 3.5–5.1)
Sodium: 136 mmol/L (ref 135–145)
Total Bilirubin: 0.4 mg/dL (ref 0.3–1.2)
Total Protein: 7.6 g/dL (ref 6.5–8.1)

## 2018-04-16 LAB — GLUCOSE, CAPILLARY
Glucose-Capillary: 262 mg/dL — ABNORMAL HIGH (ref 65–99)
Glucose-Capillary: 281 mg/dL — ABNORMAL HIGH (ref 65–99)
Glucose-Capillary: 268 mg/dL — ABNORMAL HIGH (ref 65–99)
Glucose-Capillary: 250 mg/dL — ABNORMAL HIGH (ref 65–99)

## 2018-04-16 SURGERY — SIGMOIDOSCOPY, FLEXIBLE

## 2018-04-16 MED ORDER — INSULIN GLARGINE 100 UNIT/ML ~~LOC~~ SOLN
10.0000 [IU] | Freq: Every day | SUBCUTANEOUS | Status: DC
  Administered 2018-04-16: 10 [IU] via SUBCUTANEOUS
  Filled 2018-04-16 (×2): qty 0.1

## 2018-04-16 NOTE — Brief Op Note (Signed)
04/09/2018 - 04/16/2018  9:27 AM  PATIENT:  [name]  78 y.o. female  PRE-OPERATIVE DIAGNOSIS:  rectal bleeding  POST-OPERATIVE DIAGNOSIS:  poor prep, no active bleeding  PROCEDURE:  Procedure(s) with comments: FLEXIBLE SIGMOIDOSCOPY (N/A) - No sedation   SURGEON:  Surgeon(s) and Role:    * Camilia Caywood, MD - Primary  Findings/recommendations ---------------------------------- Henrene Pastor poor prep with solid stool throughout the exam. No evidence of active bleeding but visualization was significantly limited  Recommendations ------------------------- - Resume previous diet - Reevaluate need for anticoagulation in patient with intermittent acute ITP. - If anticoagulation is needed, okay to resume from GI standpoint.Findings discussed with patient's son at bedside. - GI will sign off. Call us back if needed  Otis Brace MD, [place] 04/16/2018, 9:32 AM  Contact #  [phone]

## 2018-04-16 NOTE — Progress Notes (Signed)
Inpatient Diabetes Program Recommendations  AACE/ADA: New Consensus Statement on Inpatient Glycemic Control (2015)  Target Ranges:  Prepandial:   less than 140 mg/dL      Peak postprandial:   less than 180 mg/dL (1-2 hours)      Critically ill patients:  140 - 180 mg/dL   Lab Results  Component Value Date   GLUCAP 262 (H) 04/16/2018   HGBA1C 5.7 02/21/2017    Review of Glycemic Control  Blood sugars > goal of 140-180 mg/dL. Needs insulin adjustment. Lantus 10 units QHS just added. Continues with Decadron 10 mg Q24H.   Inpatient Diabetes Program Recommendations:     Increase Novolog to 0-15 units tidwc and hs.  Will continue to follow.  Thank you. Lorenda Peck, RD, LDN, CDE Inpatient Diabetes Coordinator [phone]

## 2018-04-16 NOTE — Progress Notes (Signed)
Patient ID: [name], female   DOB: [birth year], 77 y.o.   MRN: [id number]                                                                PROGRESS NOTE                                                                                                                                                                                                             Patient Demographics:    [name], is a 77 y.o. female, DOB - February 01, 1941, GLO:[id number]  Admit date - 04/09/2018   Admitting Physician Toy Baker, MD  Outpatient Primary MD for the patient is [name], DO  LOS - 7  Outpatient Specialists:     Chief Complaint  Patient presents with  . Rectal Bleeding  . abnormal lab value    low platelet count       Brief Narrative  77 y.o.femalewith medical history significant of breast CA,diabetes mellitus type 2, COPD, OSA, chronic diastolic heart failure, persistent atrial fibrillation on Eliquis, dementia, and diverticulosis, possible ITPhistory of pulmonary hypertension   Presented withBlood in depends yesterday rechecked CBC and noted plt 3 brought to ER.Family noted diffuse rash synovitis persistent from prior patient's Eliquis has been restarted her last dose was today She has dementia at baseline per family currently appears to be at her baseline Last admittion for rectal bleeding 3 wks agoat that time she required transfusions of 2 packets of platelets to bleeding felt to be secondary to severe thrombocytopenia during that time her platelet count was5after receiving 2.Units of platelets count improved to 221 and bleeding resolved differential included ITP versus bone marrow failure hematology consulted recommended IVIG and Decadron. (on IV dexamethasone, 20 mg daily for 4 days and IVIG at the rate of 1 g/kg daily IV for x2 days)  Regarding pertinent Chronic problems:History of cancer status post lumpectomy 2012 and radiation therapy treated with  Aromasin  While in ER:  Noted to have platelets down to 6 globin is stable at 10.9  ER provider did not noticed bright red blood per rectum but stool is Hemoccult positive      Subjective:    [name] today has been afebrile.  No brbpr, no black stool awaiting Flex sig this am.  Tolerating cardizem low dose started yesterday.   No headache, No chest pain, No abdominal pain - No Nausea, No new weakness tingling or numbness, No Cough - SOB.    Assessment  & Plan :    Principal Problem:   Acute ITP ([place]) Active Problems:   Obstructive sleep apnea   Anemia   Breast cancer of lower-inner quadrant of right female breast (HCC)   Hyperlipidemia   Lower GI bleed   COPD (chronic obstructive pulmonary disease) (HCC)   Chronic atrial fibrillation (HCC)   Chronic diastolic CHF (congestive heart failure) ([place])   Hypertension   Diabetes mellitus type 2 in obese (HCC)   Dementia without behavioral disturbance   CKD (chronic kidney disease) stage 2, GFR 60-89 ml/min     Acute ITP (HCC) with the thrombocytopenia, anemia Platelets 6 at the time of admission, was transfused 2 units of platelets pheresis, improved to 15 K=>170 -Continue dexamethasone, change from 20mg  iv qday to 10mg  I v qday (5/3) - IVIG= bradycardia 5/1, no further episode -appreciate oncology input Check cbc in am  Active Problems: Rectal bleedingHgb stable, no rectal bleeding since admission Secondary to #1, patient also has a history of diverticulosis,  Last colonoscopy 2010, 2014by [facility] consult, flex sig today  Chronic diastolic CHF Currently compensated, restart lasixat 10mg  po qday (04/11/2018) Increase lasix to baseline at 20mg  po qday (04/12/2018) Check cmp in am  Dementia Currently appears to be close to baseline, monitor for sundowning Continue Aricept  Hypertension Start cardizem 15mg  po tid for rate control and bp control (5/5)  Chronic atrial  fibrillation Currently rate controlled, Eliquis was held from the previous admission and patient had restarted it. Hr in the 80-100's Restart cardizem 15mg  po tid Continue to hold anticoagulation , will discuss with oncology tomorrow regarding restarting    COPD No wheezing Incentive spirometry  Breast cancer of lower-inner quadrant of right female breast ([place]) Distant history, currently in remission  CKD (chronic kidney disease) stage 2, GFR 60-89 ml/min Creatinine currently at baseline  Severe protein calorie malnutrition Started prostat 30 ml po bid(5/3)  Dm2 Cont fsbs ac and qhs, Iss Will add lantus tomorrow after Flex sig done  Code Status:Full code  Family Communication :w patient  Disposition Plan:Remain stepdown  Barriers For Discharge:  Consults :oncology, gastroenterology  Procedures :   DVT Prophylaxis: SCDs        RecentLabs       Lab Results  Component Value Date   PLT 211 04/15/2018      Antibiotics  :  none        Lab Results  Component Value Date   PLT 241 04/16/2018      Objective:   Vitals:   04/15/18 2200 04/15/18 2300 04/16/18 0000 04/16/18 0400  BP: (!) 143/63 (!) 142/73 (!) 150/92 136/85  Pulse: 85 73 75 73  Resp: (!) 26 10 19 15   Temp:   98 F (36.7 C) (!) 97.4 F (36.3 C)  TempSrc:   Oral Oral  SpO2: 92% 95% 100% 98%  Weight:      Height:        Wt Readings from Last 3 Encounters:  04/10/18 89.4 kg (197 lb 1.5 oz)  03/21/18 89.4 kg (197 lb 1.5 oz)  05/19/17 88.5 kg (195 lb)     Intake/Output Summary (Last 24 hours) at 04/16/2018 0535 Last data filed at 04/15/2018 2200 Gross per 24 hour  Intake 120 ml  Output 600 ml  Net -  480 ml     Physical Exam  Awake Alert, Oriented X 3, No new F.N deficits, Normal affect [place].AT,PERRAL Supple Neck,No JVD, No cervical lymphadenopathy appriciated.  Symmetrical Chest wall movement, Good air movement bilaterally,  CTAB Irr, irr s1, s2 +ve B.Sounds, Abd Soft, No tenderness, No organomegaly appriciated, No rebound - guarding or rigidity. No Cyanosis, Clubbing or edema, No new Rash or bruise     Data Review:    CBC Recent Labs  Lab 04/09/18 1604  04/12/18 0306 04/13/18 0313 04/14/18 0321 04/15/18 0327 04/16/18 0331  WBC 9.9   < > 6.9 7.1 8.1 7.4 8.2  HGB 10.9*   < > 8.9* 8.7* 9.3* 10.0* 10.2*  HCT 35.1*   < > 28.1* 27.6* 29.7* 32.3* 32.8*  PLT 6*   < > 84* 130* 171 211 241  MCV 80.5   < > 79.4 79.5 80.3 80.8 80.6  MCH 25.0*   < > 25.1* 25.1* 25.1* 25.0* 25.1*  MCHC 31.1   < > 31.7 31.5 31.3 31.0 31.1  RDW 15.3   < > 14.7 15.0 15.2 15.4 15.6*  LYMPHSABS 1.3  --   --   --   --   --   --   MONOABS 0.6  --   --   --   --   --   --   EOSABS 0.1  --   --   --   --   --   --   BASOSABS 0.0  --   --   --   --   --   --    < > = values in this interval not displayed.    Chemistries  Recent Labs  Lab 04/10/18 1826  04/12/18 0306 04/13/18 0313 04/14/18 0321 04/15/18 0327 04/16/18 0331  NA 140   < > 132* 138 134* 134* 136  K 4.4   < > 4.3 4.1 4.5 4.8 4.8  CL 100*   < > 96* 101 101 99* 101  CO2 30   < > 27 27 26 26 25   GLUCOSE 185*   < > 308* 223* 237* 286* 326*  BUN 25*   < > 48* 50* 51* 51* 55*  CREATININE 0.70   < > 1.06* 0.92 0.86 0.79 0.77  CALCIUM 9.3   < > 8.4* 8.4* 8.1* 8.2* 8.4*  MG 1.6*  --   --   --   --   --   --   AST 18  --  19 17 22 20 16   ALT 17  --  16 17 18 17 14   ALKPHOS 53  --  51 45 46 41 44  BILITOT 0.7  --  0.6 0.5 0.5 0.5 0.4   < > = values in this interval not displayed.   ------------------------------------------------------------------------------------------------------------------ No results for input(s): CHOL, HDL, LDLCALC, TRIG, CHOLHDL, LDLDIRECT in the last 72 hours.  Lab Results  Component Value Date   HGBA1C 5.7 02/21/2017   ------------------------------------------------------------------------------------------------------------------ No  results for input(s): TSH, T4TOTAL, T3FREE, THYROIDAB in the last 72 hours.  Invalid input(s): FREET3 ------------------------------------------------------------------------------------------------------------------ No results for input(s): VITAMINB12, FOLATE, FERRITIN, TIBC, IRON, RETICCTPCT in the last 72 hours.  Coagulation profile Recent Labs  Lab 04/09/18 1604  INR 1.49    No results for input(s): DDIMER in the last 72 hours.  Cardiac Enzymes Recent Labs  Lab 04/11/18 1534 04/11/18 2050 04/12/18 0306  TROPONINI <0.03 <0.03 <0.03   ------------------------------------------------------------------------------------------------------------------    Component Value Date/Time  BNP 63.8 03/24/2014 1757    Inpatient Medications  Scheduled Meds: . acetaminophen  650 mg Oral Q24H  . dexamethasone  10 mg Intravenous Q24H  . diltiazem  15 mg Oral Q8H  . diphenhydrAMINE  25 mg Oral Q24H  . donepezil  10 mg Oral QHS  . feeding supplement (PRO-STAT SUGAR FREE 64)  30 mL Oral BID  . furosemide  10 mg Oral Daily  . insulin aspart  0-5 Units Subcutaneous QHS  . insulin aspart  0-9 Units Subcutaneous TID WC  . insulin glargine  10 Units Subcutaneous QHS  . mouth rinse  15 mL Mouth Rinse BID  . metoCLOPramide  5 mg Oral BID  . mometasone-formoterol  2 puff Inhalation BID  . sodium phosphate  1 enema Rectal Once  . sodium phosphate  1 enema Rectal Once  . venlafaxine XR  37.5 mg Oral Q breakfast   Continuous Infusions: PRN Meds:.acetaminophen **OR** acetaminophen, albuterol, HYDROcodone-acetaminophen, ondansetron **OR** ondansetron (ZOFRAN) IV  Micro Results No results found for this or any previous visit (from the past 240 hour(s)).  Radiology Reports Dg Chest Port 1 View  Result Date: 03/18/2018 CLINICAL DATA:  Shortness of breath today EXAM: PORTABLE CHEST 1 VIEW COMPARISON:  October 05, 2015 FINDINGS: The heart size and mediastinal contours are within normal limits.  Mild chronic increased pulmonary interstitium is identified bilaterally unchanged. There is no focal pneumonia or pleural effusion. The visualized skeletal structures are stable. IMPRESSION: No active cardiopulmonary disease. Electronically Signed   By: Abelardo Diesel M.D.   On: 03/18/2018 17:27    Time Spent in minutes  30   Jani Gravel M.D on 04/16/2018 at 5:35 AM  Between 7am to 7pm - Pager - [phone]   After 7pm go to [email or web address] - password [facility]  [facility] -  Office  [phone]

## 2018-04-16 NOTE — Interval H&P Note (Signed)
History and Physical Interval Note:  04/16/2018 9:12 AM  [name]  has presented today for surgery, with the diagnosis of rectal bleeding  The various methods of treatment have been discussed with the patient and family. After consideration of risks, benefits and other options for treatment, the patient has consented to  Procedure(s) with comments: FLEXIBLE SIGMOIDOSCOPY (N/A) - No sedation  as a surgical intervention .  The patient's history has been reviewed, patient examined, no change in status, stable for surgery.  I have reviewed the patient's chart and labs.  Questions were answered to the patient's satisfaction.     Garvey Westcott

## 2018-04-16 NOTE — Op Note (Signed)
[facility] Patient Name: [name] Procedure Date: 04/16/2018 MRN: [id number] Attending MD: Otis Brace , MD Date of Birth: 03-23-1941 CSN: [id number] Age: 77 Admit Type: Inpatient Procedure:                Flexible Sigmoidoscopy Indications:              Rectal hemorrhage Providers:                Vista Lawman, RN, Cletis Athens, Technician, Otis Brace, MD Referring MD:              Medicines:                None Complications:            No immediate complications. Estimated Blood Loss:     Estimated blood loss: none. Procedure:                Pre-Anesthesia Assessment:                           - Prior to the procedure, a History and Physical                            was performed, and patient medications and                            allergies were reviewed. The patient's tolerance of                            previous anesthesia was also reviewed. The risks                            and benefits of the procedure and the sedation                            options and risks were discussed with the patient.                            All questions were answered, and informed consent                            was obtained. Prior Anticoagulants: The patient has                            taken Eliquis (apixaban), last dose was 5 days                            prior to procedure. After reviewing the risks and                            benefits, the patient was deemed in satisfactory                            condition to undergo  the procedure.                           After obtaining informed consent, the scope was                            passed under direct vision. The [id number] ([id number])                            scope was introduced through the anus and advanced                            to the 25 cm from the anal verge. The flexible                            sigmoidoscopy was technically difficult and complex                             due to poor bowel prep with stool present. The                            quality of the bowel preparation was poor. Scope In: Scope Out: Findings:      The perianal and digital rectal examinations were normal.      Extensive amounts of solid stool was found at 25 cm proximal to the anus.      There is no endoscopic evidence of bleeding at 25 cm Proximal to the       Anus. Impression:               - Preparation of the colon was poor.                           - Stool at 25 cm proximal to the anus.                           - No specimens collected.                           - The evaluate need for anticoagulation. If                            anticoagulation is needed, okay to resume from GI                            standpoint. Moderate Sedation:      None Recommendation:           - Return patient to hospital ward for ongoing care. Procedure Code(s):        --- Professional ---                           [phone], Sigmoidoscopy, flexible; diagnostic,                            including collection of specimen(s) by brushing or  washing, when performed (separate procedure) Diagnosis Code(s):        --- Professional ---                           K62.5, Hemorrhage of anus and rectum CPT copyright 2017 American Medical Association. All rights reserved. The codes documented in this report are preliminary and upon coder review may  be revised to meet current compliance requirements. Otis Brace, MD Otis Brace, MD 04/16/2018 9:26:53 AM Number of Addenda: 0

## 2018-04-17 LAB — CBC
HEMATOCRIT: 33.3 % — AB (ref 36.0–46.0)
HEMOGLOBIN: 10.4 g/dL — AB (ref 12.0–15.0)
MCH: 25.2 pg — ABNORMAL LOW (ref 26.0–34.0)
MCHC: 31.2 g/dL (ref 30.0–36.0)
MCV: 80.8 fL (ref 78.0–100.0)
Platelets: 232 10*3/uL (ref 150–400)
RBC: 4.12 MIL/uL (ref 3.87–5.11)
RDW: 15.9 % — ABNORMAL HIGH (ref 11.5–15.5)
WBC: 8.5 10*3/uL (ref 4.0–10.5)

## 2018-04-17 LAB — COMPREHENSIVE METABOLIC PANEL
ALK PHOS: 52 U/L (ref 38–126)
ALT: 14 U/L (ref 14–54)
AST: 16 U/L (ref 15–41)
Albumin: 2.3 g/dL — ABNORMAL LOW (ref 3.5–5.0)
Anion gap: 9 (ref 5–15)
BILIRUBIN TOTAL: 0.5 mg/dL (ref 0.3–1.2)
BUN: 50 mg/dL — AB (ref 6–20)
CO2: 25 mmol/L (ref 22–32)
CREATININE: 0.81 mg/dL (ref 0.44–1.00)
Calcium: 8.2 mg/dL — ABNORMAL LOW (ref 8.9–10.3)
Chloride: 99 mmol/L — ABNORMAL LOW (ref 101–111)
GFR calc Af Amer: >60 mL/min (ref >60)
Glucose, Bld: 308 mg/dL — ABNORMAL HIGH (ref 65–99)
Potassium: 4.7 mmol/L (ref 3.5–5.1)
Sodium: 133 mmol/L — ABNORMAL LOW (ref 135–145)
TOTAL PROTEIN: 7.2 g/dL (ref 6.5–8.1)

## 2018-04-17 LAB — GLUCOSE, CAPILLARY
Glucose-Capillary: 266 mg/dL — ABNORMAL HIGH (ref 65–99)
Glucose-Capillary: 312 mg/dL — ABNORMAL HIGH (ref 65–99)
Glucose-Capillary: 185 mg/dL — ABNORMAL HIGH (ref 65–99)

## 2018-04-17 MED ORDER — PRO-STAT SUGAR FREE PO LIQD: 30.0000 mL | Freq: Two times a day (BID) | ORAL | 0 refills | Status: DC

## 2018-04-17 MED ORDER — ORAL CARE MOUTH RINSE: 15.0000 mL | Freq: Two times a day (BID) | OROMUCOSAL | 0 refills | Status: DC

## 2018-04-17 MED ORDER — DILTIAZEM HCL 30 MG PO TABS: 15.0000 mg | ORAL_TABLET | Freq: Three times a day (TID) | ORAL | 0 refills | Status: DC

## 2018-04-17 MED ORDER — INSULIN GLARGINE 100 UNIT/ML ~~LOC~~ SOLN: 10.0000 [IU] | Freq: Every day | SUBCUTANEOUS | 11 refills | Status: DC

## 2018-04-17 MED ORDER — DEXAMETHASONE 2 MG PO TABS: ORAL_TABLET | ORAL | 0 refills | Status: DC

## 2018-04-17 MED ORDER — FUROSEMIDE 20 MG PO TABS
10.0000 mg | ORAL_TABLET | Freq: Every day | ORAL | 0 refills | Status: AC
Start: 2018-04-17 — End: ?

## 2018-04-17 NOTE — NC FL2 (Signed)
[place] LEVEL OF CARE SCREENING TOOL     IDENTIFICATION  Patient Name: [name] Birthdate: June 13, 1941 Sex: female Admission Date (Current Location): 04/09/2018  [facility] and [place] Number:  Herbalist and Address:  [facility],  [place] [address], [place]      Provider Number: [id number]  Attending Physician Name and Address:  Jani Gravel, MD  Relative Name and Phone Number:       Current Level of Care: Hospital Recommended Level of Care: [place] Prior Approval Number:    Date Approved/Denied:   PASRR Number: [id number]  Discharge Plan: SNF    Current Diagnoses: Patient Active Problem List   Diagnosis Date Noted  . Acute ITP ([place])   . Palliative care by specialist   . Goals of care, counseling/discussion   . Diverticular hemorrhage 03/18/2018  . Thrombocytopenia ([place]) 03/18/2018  . 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 CKD (chronic kidney disease) stage 2, GFR 60-89 ml/min 07/12/2016  . Hypertensive heart disease with heart failure ([place]) 05/17/2016  . Diabetes mellitus type 2 in obese ([place]) 05/17/2016  . Dementia without behavioral disturbance 05/17/2016  . Hypotension 10/05/2015  . Osteoporosis 10/02/2014  . Vertigo 04/19/2014  . Pre-syncope 04/17/2014  . Pulmonary hypertension- PA pressure 46 mmHg 01/29/2014  . Hypokalemia 01/27/2014  . Acute on chronic diastolic heart failure ([place]) 01/24/2014  . Hypertension 11/27/2013  . Constipation 11/27/2013  . Chronic diastolic CHF (congestive heart failure) ([place]) 11/18/2013  . Bradycardia 11/01/2013  . Chronic atrial fibrillation ([place]) 11/01/2013  . Chronic anticoagulation, on Eliquis 11/01/2013  . COPD (chronic obstructive pulmonary disease) ([place]) 08/19/2013  . Chronic respiratory failure with hypoxia ([place]) 08/19/2013  . Weakness generalized 08/19/2013  . Lung nodule seen on imaging study 08/18/2013  . Nausea and vomiting 08/18/2013   . Lower GI bleed 05/18/2013  . Aspiration pneumonia ([place]) 05/12/2013  . Hyperlipidemia 05/03/2013  . COPD exacerbation ([place]) 11/20/2012  . Gout 11/17/2012  . Hypocalcemia 11/17/2012  . Upper respiratory infection 12/17/2011  . Breast cancer of lower-inner quadrant of right female breast ([place]) 10/17/2011  . Obesity hypoventilation syndrome ([place]) 09/24/2011  . Peripheral edema 09/24/2011  . Anemia 06/01/2011  . 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  . Bronchitis 05/27/2008  . Obstructive sleep apnea 05/27/2008    Orientation RESPIRATION BLADDER Height & Weight     Self, Place  O2 Incontinent Weight: 197 lb 1.5 oz (89.4 kg) Height:  5\' 5"  (165.1 cm)  BEHAVIORAL SYMPTOMS/MOOD NEUROLOGICAL BOWEL NUTRITION STATUS      Incontinent Diet(see dc summary)  AMBULATORY STATUS COMMUNICATION OF NEEDS Skin   Total Care Verbally Normal                       Personal Care Assistance Level of Assistance  Bathing, Feeding, Dressing Bathing Assistance: Maximum assistance Feeding assistance: Limited assistance Dressing Assistance: Maximum assistance     Functional Limitations Info  Hearing, Sight Sight Info: Adequate Hearing Info: Impaired Speech Info: Adequate    SPECIAL CARE FACTORS FREQUENCY                       Contractures      Additional Factors Info  Code Status, Allergies Code Status Info: Full Code Allergies Info: Aspirin; Ibuprofen; Contrast Media iodinated diagnostic agents; Pravachol           Current Medications (04/17/2018):  This is the current hospital active medication list Current Facility-Administered  Medications  Medication Dose Route Frequency Provider Last Rate Last Dose  . acetaminophen (TYLENOL) tablet 650 mg  650 mg Oral Q6H PRN Toy Baker, MD       Or  . acetaminophen (TYLENOL) suppository 650 mg  650 mg Rectal Q6H PRN Doutova, Anastassia, MD      . acetaminophen (TYLENOL) tablet 650 mg  650 mg Oral Q24H Nicholas Lose, MD   Stopped at  04/13/18 0930  . albuterol (PROVENTIL) (2.5 MG/3ML) 0.083% nebulizer solution 2.5 mg  2.5 mg Nebulization Q4H PRN Doutova, Anastassia, MD      . dexamethasone (DECADRON) injection 10 mg  10 mg Intravenous Q24H Nicholas Lose, MD   10 mg at 04/16/18 2022  . diltiazem (CARDIZEM) tablet 15 mg  15 mg Oral Q8H Jani Gravel, MD   15 mg at 04/17/18 1305  . diphenhydrAMINE (BENADRYL) capsule 25 mg  25 mg Oral Q24H Nicholas Lose, MD   Stopped at 04/13/18 0930  . donepezil (ARICEPT) tablet 10 mg  10 mg Oral QHS Rai, Ripudeep K, MD   10 mg at 04/16/18 2212  . feeding supplement (PRO-STAT SUGAR FREE 64) liquid 30 mL  30 mL Oral BID Jani Gravel, MD   30 mL at 04/17/18 1058  . furosemide (LASIX) tablet 10 mg  10 mg Oral Daily Jani Gravel, MD   10 mg at 04/17/18 1055  . HYDROcodone-acetaminophen (NORCO/VICODIN) 5-325 MG per tablet 1-2 tablet  1-2 tablet Oral Q4H PRN Doutova, Anastassia, MD      . insulin aspart (novoLOG) injection 0-5 Units  0-5 Units Subcutaneous QHS Toy Baker, MD   2 Units at 04/16/18 2213  . insulin aspart (novoLOG) injection 0-9 Units  0-9 Units Subcutaneous TID WC Doutova, Anastassia, MD   9 Units at 04/17/18 1300  . insulin glargine (LANTUS) injection 10 Units  10 Units Subcutaneous QHS Jani Gravel, MD   10 Units at 04/16/18 2212  . MEDLINE mouth rinse  15 mL Mouth Rinse BID Jani Gravel, MD   15 mL at 04/17/18 0900  . metoCLOPramide (REGLAN) tablet 5 mg  5 mg Oral BID Toy Baker, MD   5 mg at 04/17/18 1055  . mometasone-formoterol (DULERA) 200-5 MCG/ACT inhaler 2 puff  2 puff Inhalation BID Toy Baker, MD   2 puff at 04/17/18 0909  . ondansetron (ZOFRAN) tablet 4 mg  4 mg Oral Q6H PRN Toy Baker, MD       Or  . ondansetron (ZOFRAN) injection 4 mg  4 mg Intravenous Q6H PRN Doutova, Anastassia, MD      . venlafaxine XR (EFFEXOR-XR) 24 hr capsule 37.5 mg  37.5 mg Oral Q breakfast Doutova, Anastassia, MD   37.5 mg at 04/17/18 2831     Discharge  Medications: Please see discharge summary for a list of discharge medications.  Relevant Imaging Results:  Relevant Lab Results:   Additional Information SS#: [id number]  Burnis Medin, LCSW

## 2018-04-17 NOTE — Clinical Social Work Note (Signed)
CSW following to assist with discharge to SNF for [name] term care. Patient is a [name] term care resident at [facility]. Patient accompanied by son [name]), patient/patient's son verbalized plan for patient to discharge back to [facility]. CSW spoke with admissions staff member [name] who confirmed patient's ability to return. CSW will continue to follow and assist with discharge planning. Patient recently assessed on 03/19/2018, no psychosocial changes reported see assessment below.  Clinical Social Work Assessment  Patient Details  Name: [name] MRN: [id number] Date of Birth: May 17, 1941  Date of referral:                  Reason for consult:                   Permission sought to share information with:    Permission granted to share information::     Name::        Agency::     Relationship::     Contact Information:     Housing/Transportation Living arrangements for the past 2 months:    Source of Information:    Patient Interpreter Needed:    Criminal Activity/Legal Involvement Pertinent to Current Situation/Hospitalization:    Significant Relationships:    Lives with:    Do you feel safe going back to the place where you live?    Need for family participation in patient care:     Care giving concerns:  Patient from [facility], admitted to hospital due to bloody stool. She arrives here in no distress. She is oriented to her situation and tells [place] she is not having any pain nor discomfort.    Social Worker assessment / plan:  CSW intern met with patients son [name], [phone] via bedside to discuss discharge planning. [name] informed this Probation officer that patient has been residing at [facility] for about three years now in there [facility]-term care. [name] informed this Probation officer that the plan is for patient to return to [facility] once stable for discharge.   CSW intern assessed for supports outside of the hospital and [name] informed this Probation officer that  patient has several family members as supports.   Employment status:    Insurance information:    PT Recommendations:    Information / Referral to community resources:     Patient/Family's Response to care:  Patient and family are appreciative of care being provided by the ED team.  Patient/Family's Understanding of and Emotional Response to Diagnosis, Current Treatment, and Prognosis:  Patient and family understand the current diagnosis and treatment plan in place. Patient and family currently understand patients functional abilities.  Emotional Assessment Appearance:    Attitude/Demeanor/Rapport:    Affect (typically observed):    Orientation:   to situation Alcohol / Substance use:    Psych involvement (Current and /or in the community):     Discharge Needs  Concerns to be addressed:    Readmission within the last 30 days:    Current discharge risk:    Barriers to Discharge:      Sharon Medin, LCSW 04/17/2018, 1:44 PM

## 2018-04-17 NOTE — Progress Notes (Signed)
Report called to [place] at [facility] [phone]. Questions, concerns denied

## 2018-04-17 NOTE — Discharge Summary (Signed)
[name], is a 77 y.o. female  DOB 05-15-1941  MRN [id number].  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, anemia Platelets 6 at the time of admission, was transfused 2 units of platelets pheresis, improved to 15 K=>170 -Continue dexamethasone, change from 20mg  iv qday to 10mg  I v qday (5/3) - IVIG= bradycardia 5/1, no further episode Dexamethasone taper, see below -appreciate oncology input Needs to see oncology in 3-4 weeks please, SNF to please make appt  Active Problems: Rectal bleedingHgb stable, no rectal bleeding since admission Secondary to #1, patient also has a history of diverticulosis,  EGD/ Colonoscopy 5/6=> negative check cbc in 3 days  Chronic diastolic CHF Currently compensated, restart lasixat 10mg  po qday (04/11/2018) Check cmp in 3 days  Dementia Currently appears to be close to baseline, monitor for sundowning Continue Aricept  Hypertension Started cardizem 15mg  po tid for rate control and bp control (5/5) Check bp q shift at SNF x 72 hours   Chronic atrial fibrillation Currently rate controlled, Eliquis was held from the previous admission and patient had restarted it. Hr in the 80-100's Restart cardizem 15mg  po tid No anticoagulation due to hx of bleeding for now.  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]   Discharge Diagnosis  Thrombocytopenia ([place]) [D69.6] Acute ITP ([place]) [D69.3]     Principal Problem:   Acute ITP ([place]) Active Problems:   Obstructive sleep apnea   Anemia   Breast cancer of lower-inner quadrant of right female breast (HCC)   Hyperlipidemia   Lower GI bleed   COPD (chronic obstructive pulmonary disease) (HCC)   Chronic atrial fibrillation (HCC)   Chronic diastolic CHF (congestive heart failure) (HCC)   Hypertension   Diabetes mellitus type 2 in obese (HCC)   Dementia without behavioral disturbance   CKD (chronic kidney disease) stage 2, GFR 60-89 ml/min      Past Medical History:  Diagnosis Date  . Anemia   . Asthma   . Atrial fibrillation, persistent ([place]) 11/01/2013  . Breast cancer, stage 1 ([place]) 10/17/2011   s/p right lumpectomy and XRT  . Bronchitis   . Bruises easily   . Chills   . Chronic anticoagulation, on Eliquis 11/01/2013  . Chronic bronchitis ([place])   . Chronic diastolic CHF (congestive heart failure) ([place])   . Constipation   .  COPD (chronic obstructive pulmonary disease) ([place])   . Cough   . Dementia without behavioral disturbance   . Dyslipidemia   . Fracture of left lower leg 06/1972   "no surgery; just casted it"  . Generalized weakness   . Gout attack    ankle, then wrist and hands  . Gout without tophus   . Hernia   . History of nuclear stress test 02/09/2010   dipyridamole; normal pattern of perfusion in all regions with attenuation artifact in inferior region, low risk   . Hypertension   . Insomnia   . Morbid obesity ([place])   . On home oxygen therapy    "2L prn" (01/24/2014)  . OSA on CPAP   . Pneumonia 2012; 2014  . Protein calorie malnutrition ([place])   . Sarcoid   . Sarcoidosis   . Type II diabetes mellitus ([place])   . Uterine cancer [facility])    s/p hysterectomy  .  Wheezing     Past Surgical History:  Procedure Laterality Date  . ABDOMINAL HYSTERECTOMY  02/04/2005  . BREAST BIOPSY Right 01/2011  . BREAST LUMPECTOMY Right 01/2011  . HERNIA REPAIR    . TOTAL KNEE ARTHROPLASTY Bilateral 03/2010; 06/2011   left; right  . TRANSTHORACIC ECHOCARDIOGRAM  07/2008   EF, LV size is normal; RVSP normal; mod calcif of MV apparatus  . TUBAL LIGATION  1970's  . UMBILICAL HERNIA REPAIR  02/04/2005       HPI  from the history and physical done on the day of admission:     77 y.o.femalewith medical history significant of breast CA,diabetes mellitus type 2, COPD, OSA, chronic diastolic heart failure, persistent atrial fibrillation on Eliquis, dementia, and diverticulosis, possible ITPhistory of pulmonary hypertension   Presented withBlood in depends yesterday rechecked CBC and noted plt 3 brought to ER.Family noted diffuse rash synovitis persistent from prior patient's Eliquis has been restarted her last dose was today She has dementia at baseline per family currently appears to be at her baseline Last admittion for rectal bleeding 3 wks agoat that time she required transfusions of 2 packets of platelets to bleeding felt to be secondary to severe thrombocytopenia during that time her platelet count was5after receiving 2.Units of platelets count improved to 221 and bleeding resolved differential included ITP versus bone marrow failure hematology consulted recommended IVIG and Decadron. (on IV dexamethasone, 20 mg daily for 4 days and IVIG at the rate of 1 g/kg daily IV for x2 days)  Regarding pertinent Chronic problems:History of cancer status post lumpectomy 2012 and radiation therapy treated with Aromasin  While in ER:  Noted to have platelets down to 6 globin is stable at 10.9  ER provider did not noticed bright red blood per rectum but stool is Hemoccult positive       Hospital Course:     Pt was admitted on 4/29 for recurrent  thrombocytopenia, plt =6.  And concern for rectal bleeding. Pt was transfused with 2 packs of platelets.  Hematology consulted and thought her thrombocytopenia was likely related to ITP.  b12 and folate were checked and her folic acid was slightly low. Pt was started on iv decadron at 20mg  iv qday and ivig.    Pt had some difficulty with ivig causing some mild bradycardia but this resolved.  Pt decadron was decreased to 10mg  iv qday on 5/3   Due to concern for rectal bleeding though she has not had any since admission GI consulted and pt had flex sig on  5/6 which was not revealing.   Pt lasix initially held but restarted on lasix at 10mg  po qday 5/1.  Her heart rate increased on 5/5 to 100's and was restarted on low dose cardizem (which had been held at admission) orally for rate control at 15mg  po tid.  Her sugars have been running high probably due to steroid and pt had lantus 10 units [place] qhs added on 5/6.  Her anticoagulation has been held due to concerns for bleeding.  She has not been on anticoagulation at least since prior admission. Pt appears to be doing well and will will be discharge back to SNF.   Follow UP  Follow-up Information    SNF physician in 1-3 days Follow up in 3 day(s).        Nicholas Lose, MD Follow up in 3 week(s).   Specialty:  Hematology and Oncology Contact information: [place] [id number] [phone]            Consults obtained - hematology/oncology  Discharge Condition: stable  Diet and Activity recommendation: See Discharge Instructions below  Discharge Instructions         Discharge Medications     Allergies as of 04/17/2018      Reactions   Aspirin Other (See Comments)   Avoids due to being on blood thinners   Ibuprofen Other (See Comments)   Avoids due to being on blood thinners   Contrast Media [iodinated Diagnostic Agents] Nausea And Vomiting   Pravachol Other (See Comments)   Muscle pain      Medication  List    STOP taking these medications   apixaban 5 MG Tabs tablet Commonly known as:  ELIQUIS   BABY [place] EX   diltiazem 240 MG 24 hr capsule Commonly known as:  CARDIZEM CD     TAKE these medications   albuterol (2.5 MG/3ML) 0.083% nebulizer solution Commonly known as:  PROVENTIL Take by nebulization every 6 (six) hours as needed for wheezing or shortness of breath.   Calcium Carb-Cholecalciferol 500-200 MG-UNIT Tabs Take 1 tablet by mouth 2 (two) times daily. Reported on 01/26/2016   dexamethasone 2 MG tablet Commonly known as:  DECADRON 2 tablet po qday x 7 days then 1 tablet po qday x 7 days then 1/2 tablet po qday x 7 days   diltiazem 30 MG tablet Commonly known as:  CARDIZEM Take 0.5 tablets (15 mg total) by mouth every 8 (eight) hours.   docusate sodium 100 MG capsule Commonly known as:  COLACE Take 100 mg by mouth daily as needed for mild constipation.   donepezil 10 MG tablet Commonly known as:  ARICEPT Take 10 mg by mouth at bedtime.   feeding supplement (PRO-STAT SUGAR FREE 64) Liqd Take 30 mLs by mouth 2 (two) times daily.   furosemide 20 MG tablet Commonly known as:  LASIX Take 0.5 tablets (10 mg total) by mouth daily. What changed:  how much to take   insulin glargine 100 UNIT/ML injection Commonly known as:  LANTUS Inject 0.1 mLs (10 Units total) into the skin at bedtime.   lactulose 10 GM/15ML solution Commonly known as:  CHRONULAC Take by mouth daily. Give 15 mL by mouth   metoCLOPramide 5 MG tablet Commonly known as:  REGLAN Take 5 mg by mouth 2 (two) times daily.   mouth rinse Liqd solution 15 mLs by Mouth Rinse route 2 (two) times daily.   nystatin powder Generic drug:  nystatin Apply Nystatin powder under  breast and elbow fold 3 times daily for 2 weeks   ondansetron 8 MG tablet Commonly known as:  ZOFRAN Take 8 mg by mouth every 8 (eight) hours as needed for nausea or vomiting.   OXYGEN Inhale 2 L/min into the lungs continuous.    SYMBICORT 160-4.5 MCG/ACT inhaler Generic drug:  budesonide-formoterol Inhale 2 puffs into the lungs 2 (two) times daily. Rinse mouth well after use   SYSTANE COMPLETE OP Place 1 drop into both eyes 3 (three) times daily.   venlafaxine XR 37.5 MG 24 hr capsule Commonly known as:  EFFEXOR-XR Take 37.5 mg by mouth daily with breakfast.   Vitamin D (Ergocalciferol) 50000 units Caps capsule Commonly known as:  DRISDOL Take 50,000 Units by mouth every 28 (twenty-eight) days.       Major procedures and Radiology Reports - PLEASE review detailed and final reports for all details, in brief -       Dg Chest Port 1 View  Result Date: 03/18/2018 CLINICAL DATA:  Shortness of breath today EXAM: PORTABLE CHEST 1 VIEW COMPARISON:  October 05, 2015 FINDINGS: The heart size and mediastinal contours are within normal limits. Mild chronic increased pulmonary interstitium is identified bilaterally unchanged. There is no focal pneumonia or pleural effusion. The visualized skeletal structures are stable. IMPRESSION: No active cardiopulmonary disease. Electronically Signed   By: Abelardo Diesel M.D.   On: 03/18/2018 17:27    Micro Results      No results found for this or any previous visit (from the past 240 hour(s)).     Today   Subjective    [name] today is doing well. Not having any bleeding,  Mental status appears to be at baseline.  plt have improved.    no headache,no chest no abdominal pain,no new weakness tingling or numbness, feels much better wants to go home today.   Objective   Blood pressure 127/78, pulse 72, temperature 97.9 F (36.6 C), temperature source Oral, resp. rate 16, height 5\' 5"  (1.651 m), weight 89.4 kg (197 lb 1.5 oz), SpO2 99 %.   Intake/Output Summary (Last 24 hours) at 04/17/2018 1432 Last data filed at 04/17/2018 1300 Gross per 24 hour  Intake 240 ml  Output 1600 ml  Net -1360 ml    Exam Awake Alert, Oriented x 3, No new F.N deficits, Normal  affect [place].AT,PERRAL Supple Neck,No JVD, No cervical lymphadenopathy appriciated.  Symmetrical Chest wall movement, Good air movement bilaterally, CTAB RRR,No Gallops,Rubs or new Murmurs, No Parasternal Heave +ve B.Sounds, Abd Soft, Non tender, No organomegaly appriciated, No rebound -guarding or rigidity. No Cyanosis, Clubbing or edema, No new Rash or bruise   Data Review   CBC w Diff:  Lab Results  Component Value Date   WBC 8.5 04/17/2018   HGB 10.4 (L) 04/17/2018   HGB 14.4 10/02/2014   HCT 33.3 (L) 04/17/2018   HCT 32.6 (L) 03/19/2018   HCT 44.8 10/02/2014   PLT 232 04/17/2018   PLT 184 10/02/2014   LYMPHOPCT 13 04/09/2018   LYMPHOPCT 23.0 10/02/2014   MONOPCT 6 04/09/2018   MONOPCT 7.4 10/02/2014   EOSPCT 1 04/09/2018   EOSPCT 5.9 10/02/2014   BASOPCT 0 04/09/2018   BASOPCT 0.4 10/02/2014    CMP:  Lab Results  Component Value Date   NA 133 (L) 04/17/2018   NA 143 02/21/2017   NA 142 10/02/2014   K 4.7 04/17/2018   K 4.3 10/02/2014   CL 99 (L) 04/17/2018   CL 98  03/08/2013   CO2 25 04/17/2018   CO2 30 (H) 10/02/2014   BUN 50 (H) 04/17/2018   BUN 20 02/21/2017   BUN 19.7 10/02/2014   CREATININE 0.81 04/17/2018   CREATININE 0.8 10/02/2014   GLU 102 02/21/2017   PROT 7.2 04/17/2018   PROT 6.5 10/02/2014   ALBUMIN 2.3 (L) 04/17/2018   ALBUMIN 3.7 10/02/2014   BILITOT 0.5 04/17/2018   BILITOT 0.60 10/02/2014   ALKPHOS 52 04/17/2018   ALKPHOS 101 10/02/2014   AST 16 04/17/2018   AST 18 10/02/2014   ALT 14 04/17/2018   ALT 21 10/02/2014  .   Total Time in preparing paper work, data evaluation and todays exam - 66 minutes  Jani Gravel M.D on 04/17/2018 at 2:32 PM  [facility]   Office  [phone]

## 2018-04-17 NOTE — Progress Notes (Signed)
avs provided to pts son. ptar arrived, pt stable. No change from am assessment

## 2018-04-17 NOTE — Clinical Social Work Placement (Signed)
Patient returning to [facility]. Facility aware of patient's discharge and confirmed patient's ability to return. PTAR contacted, patient's family notified. Patient's RN can call report to [phone] Room 906B, packet complete. CSW signing off, no other needs identified at this time.  CLINICAL SOCIAL WORK PLACEMENT  NOTE  Date:  04/17/2018  Patient Details  Name: [name] MRN: [id number] Date of Birth: 1941/12/12  Clinical Social Work is seeking post-discharge placement for this patient at the [place] level of care (*CSW will initial, date and re-position this form in  chart as items are completed):  Yes   Patient/family provided with [place] Work Department's list of facilities offering this level of care within the geographic area requested by the patient (or if unable, by the patient's family).  Yes   Patient/family informed of their freedom to choose among providers that offer the needed level of care, that participate in Medicare, Medicaid or managed care program needed by the patient, have an available bed and are willing to accept the patient.  Yes   Patient/family informed of [facility]'s ownership interest in [facility] and [facility], as well as of the fact that they are under no obligation to receive care at these facilities.  PASRR submitted to EDS on       PASRR number received on       Existing PASRR number confirmed on 04/17/18     FL2 transmitted to all facilities in geographic area requested by pt/family on 04/17/18     FL2 transmitted to all facilities within larger geographic area on       Patient informed that his/her managed care company has contracts with or will negotiate with certain facilities, including the following:        Yes   Patient/family informed of bed offers received.  Patient chooses bed at [facility]     Physician recommends and patient chooses bed at      Patient to be  transferred to [facility] on 04/17/18.  Patient to be transferred to facility by PTAR     Patient family notified on 04/17/18 of transfer.  Name of family member notified:  Lew Dawes     PHYSICIAN       Additional Comment:    _______________________________________________ Burnis Medin, LCSW 04/17/2018, 3:12 PM

## 2018-04-17 NOTE — Care Management Important Message (Signed)
Important Message  Patient Details  Name: [name] MRN: [id number] Date of Birth: 1941-07-30   Medicare Important Message Given:  Yes    Kerin Salen 04/17/2018, 1:01 PMImportant Message  Patient Details  Name: [name] MRN: [id number] Date of Birth: [birth year]   Medicare Important Message Given:  Yes    Kerin Salen 04/17/2018, 1:01 PM

## 2018-04-19 ENCOUNTER — Telehealth: Payer: Self-pay | Admitting: Internal Medicine

## 2018-04-19 ENCOUNTER — Non-Acute Institutional Stay: Payer: Self-pay | Admitting: Internal Medicine

## 2018-04-19 ENCOUNTER — Encounter (HOSPITAL_COMMUNITY): Payer: Self-pay | Admitting: Gastroenterology

## 2018-04-19 DIAGNOSIS — R413 Other amnesia: Secondary | ICD-10-CM

## 2018-04-19 DIAGNOSIS — R531 Weakness: Secondary | ICD-10-CM

## 2018-04-19 DIAGNOSIS — D693 Immune thrombocytopenic purpura: Secondary | ICD-10-CM

## 2018-04-19 DIAGNOSIS — Z7189 Other specified counseling: Secondary | ICD-10-CM

## 2018-04-19 NOTE — Telephone Encounter (Signed)
New Message   Pt c/o medication issue:  1. Name of Medication: aspirin  2. How are you currently taking this medication (dosage and times per day)?   3. Are you having a reaction (difficulty breathing--STAT)?  4. What is your medication issue? Patient was recently in the hospital because her blood platelets. count was low. While in the hospital they took her off the losartan because of the low blood platelets and they put her on aspirin. But the patient is allergic to aspirin. Please call.

## 2018-04-19 NOTE — Telephone Encounter (Signed)
Spoke with pt son, the patient was recently in the hospital for low platelets. It was felt that the eliquis was part of the problem and it was stopped. It was recommended that the patient take a baby aspirin instead. [facility] is concerned because aspirin is on her allergy list as it is here. Her son does not know what kind of reaction she had if any from aspirin. Notes in the chart under allergies says to avoid because of blood thinners. [facility] ask the son to call us for recommendations regarding the aspirin. Will forward to dr hilty to review and advis.

## 2018-04-19 NOTE — Telephone Encounter (Signed)
Left message to call back  

## 2018-04-19 NOTE — Telephone Encounter (Signed)
New message ° °Pt son verbalized that he is returning call for RN °

## 2018-04-20 ENCOUNTER — Telehealth: Payer: Self-pay | Admitting: *Deleted

## 2018-04-20 DIAGNOSIS — D693 Immune thrombocytopenic purpura: Secondary | ICD-10-CM | POA: Insufficient documentation

## 2018-04-20 DIAGNOSIS — R413 Other amnesia: Secondary | ICD-10-CM | POA: Insufficient documentation

## 2018-04-20 LAB — GLUCOSE, CAPILLARY: GLUCOSE-CAPILLARY: 298 mg/dL — AB (ref 65–99)

## 2018-04-20 NOTE — Telephone Encounter (Signed)
This RN spoke with pt's son and HCPOA - [name] per his inquiry about medication order post recent d/c.  Per [name] - Eliquis was discontinued upon admission. ASA was recommended for use post discharge " but not put on d/c orders "  Per his inquiry with Dr Debara Pickett - daily ASA therapy is ok if approved by Dr Lindi Adie.  Above reviewed with Dr Lindi Adie who gave order for aspirin 81 mg daily.  Noted on allergy list " aspirin and ibuprophen " as contraindicated due to use of blood thinners. Pt is no longer on blood thinners.  This RN called above to [employer] - and obtained contact number for [facility]. Order faxed.

## 2018-04-20 NOTE — Telephone Encounter (Signed)
Spoke with pt son, aware of dr hilty's recommendations.

## 2018-04-20 NOTE — Telephone Encounter (Signed)
I would defer to her hematologist about the low platelets and whether it is safe to go on aspirin. Looks like they have recovered, but she got platelet infusion and steroids - had GI bleeding a month ago, that is why she is off Eliquis.  Dr. Lemmie Evens

## 2018-04-20 NOTE — Progress Notes (Signed)
PALLIATIVE CARE CONSULT VISIT   PATIENT NAME: [name] DOB: 06-10-1941 MRN: [id number]  PRIMARY CARE PROVIDER:   [name], DO  REFERRING PROVIDER:      [name], DO [address] [place], [place] [address]  RESPONSIBLE PARTYChristia Reading Larson(son) [phone]   RECOMMENDATIONS and PLAN:  1. Weakness generalized R53.1:  Chronic state and multifactorial.  No expectation for major improvement and is at her baseline now.  Supportive care by staff with performing ADLs.  Monitor  2. 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  Expect re-occurence in the future.  Discussion with son/POA for expectations and planning.  Monitor and plan on transition to Hospice care.  3. Memory Loss R41.3:  At baseline per staff.  FAST 7c.  On Aricept and Effexor.  Supportive care.  4.  Goals of care counseling/discussion Z71.89:  Telephone discussion with son [name] with post hospitalization updates.  He plans not to re-hospitalize pt and have comfort measures provided via Hospice.  He agrees with antibiotic and IV therapy if necessary but no feeding tube.  He plans on confirming this plan with his brother over next 2 days and we will re-discuss next week.    I spent 30 minutes providing this consultation at [name],  from 1:00PM to 1:30PM. More than 50% of the time in this consultation was spent coordinating communication with pt, son and Sharon Gambler, NP.   HISTORY OF PRESENT ILLNESS: Followup with [name] who was recently hospitalized from 4/29-04/17/18 due to rectal bleeding and acute ITP.  This was a reoccurence from a previous hospitalization.  She also has a hx of dementia. Protein calorie malnutrtion and CKD stage 2.  Staff reports that her cognitive and functional status has returned to baseline in that she is alert but requires total care.  She is eating aprox 50-75% of meals with feeding assist.  No reports of bleeding since returning to  snf.  Palliative Care was asked to help address goals of care.   CODE STATUS: DNR  PPS: 30% Totally bedbound HOSPICE ELIGIBILITY/DIAGNOSIS: Under discussion/Chronic ITP  PAST MEDICAL HISTORY:  Past Medical History:  Diagnosis Date  . Anemia   . Asthma   . Atrial fibrillation, persistent ([place]) 11/01/2013  . Breast cancer, stage 1 ([place]) 10/17/2011   s/p right lumpectomy and XRT  . Bronchitis   . Bruises easily   . Chills   . Chronic anticoagulation, on Eliquis 11/01/2013  . Chronic bronchitis ([place])   . Chronic diastolic CHF (congestive heart failure) ([place])   . Constipation   . COPD (chronic obstructive pulmonary disease) ([place])   . Cough   . Dementia without behavioral disturbance   . Dyslipidemia   . Fracture of left lower leg 06/1972   "no surgery; just casted it"  . Generalized weakness   . Gout attack    ankle, then wrist and hands  . Gout without tophus   . Hernia   . History of nuclear stress test 02/09/2010   dipyridamole; normal pattern of perfusion in all regions with attenuation artifact in inferior region, low risk   . Hypertension   . Insomnia   . Morbid obesity ([place])   . On home oxygen therapy    "2L prn" (01/24/2014)  . OSA on CPAP   . Pneumonia 2012; 2014  . Protein calorie malnutrition ([place])   . Sarcoid   . Sarcoidosis   . Type II diabetes mellitus ([place])   .  Uterine cancer [facility])    s/p hysterectomy  . Wheezing     SOCIAL HX:  Social History   Tobacco Use  . Smoking status: Never Smoker  . Smokeless tobacco: Never Used  Substance Use Topics  . Alcohol use: No    ALLERGIES:  Allergies  Allergen Reactions  . Contrast Media [Iodinated Diagnostic Agents] Nausea And Vomiting  . Pravachol Other (See Comments)    Muscle pain     PERTINENT MEDICATIONS:  Outpatient Encounter Medications as of 04/19/2018  Medication Sig  . albuterol (PROVENTIL) (2.5 MG/3ML) 0.083% nebulizer solution Take by nebulization every 6 (six) hours as needed for wheezing or  shortness of breath.  . Amino Acids-Protein Hydrolys (FEEDING SUPPLEMENT, PRO-STAT SUGAR FREE 64,) LIQD Take 30 mLs by mouth 2 (two) times daily.  . Calcium Carb-Cholecalciferol 500-200 MG-UNIT TABS Take 1 tablet by mouth 2 (two) times daily. Reported on 01/26/2016  . dexamethasone (DECADRON) 2 MG tablet 2 tablet po qday x 7 days then 1 tablet po qday x 7 days then 1/2 tablet po qday x 7 days  . diltiazem (CARDIZEM) 30 MG tablet Take 0.5 tablets (15 mg total) by mouth every 8 (eight) hours.  . docusate sodium (COLACE) 100 MG capsule Take 100 mg by mouth daily as needed for mild constipation.  Marland Kitchen donepezil (ARICEPT) 10 MG tablet Take 10 mg by mouth at bedtime.  . furosemide (LASIX) 20 MG tablet Take 0.5 tablets (10 mg total) by mouth daily.  . insulin glargine (LANTUS) 100 UNIT/ML injection Inject 0.1 mLs (10 Units total) into the skin at bedtime.  Marland Kitchen lactulose (CHRONULAC) 10 GM/15ML solution Take by mouth daily. Give 15 mL by mouth  . metoCLOPramide (REGLAN) 5 MG tablet Take 5 mg by mouth 2 (two) times daily.  Marland Kitchen mouth rinse LIQD solution 15 mLs by Mouth Rinse route 2 (two) times daily.  . NYSTATIN powder Apply Nystatin powder under breast and elbow fold 3 times daily for 2 weeks  . ondansetron (ZOFRAN) 8 MG tablet Take 8 mg by mouth every 8 (eight) hours as needed for nausea or vomiting.  . OXYGEN Inhale 2 L/min into the lungs continuous.   Marland Kitchen Propylene Glycol (SYSTANE COMPLETE OP) Place 1 drop into both eyes 3 (three) times daily.  . SYMBICORT 160-4.5 MCG/ACT inhaler Inhale 2 puffs into the lungs 2 (two) times daily. Rinse mouth well after use  . venlafaxine XR (EFFEXOR-XR) 37.5 MG 24 hr capsule Take 37.5 mg by mouth daily with breakfast.  . Vitamin D, Ergocalciferol, (DRISDOL) 50000 units CAPS capsule Take 50,000 Units by mouth every 28 (twenty-eight) days.   No facility-administered encounter medications on file as of 04/19/2018.     PHYSICAL EXAM:   General: Obese elderly female reclined in  bed and in NAD Cardiovascular: regular rate and rhythm Pulmonary: clear ant fields Abdomen: soft, nontender, + bowel sounds GU: no suprapubic tenderness Extremities: no edema Skin: Purpura noted on forearms Neurological: Alert and oriented to person.  Generalized weakness.  Gonzella Lex, NP-C

## 2018-04-24 ENCOUNTER — Telehealth: Payer: Self-pay | Admitting: Hematology and Oncology

## 2018-04-24 NOTE — Telephone Encounter (Signed)
[name] Please arrange a follow up accordingly with labs ([facility])

## 2018-04-24 NOTE — Telephone Encounter (Signed)
Per Dr.Gudena's note, please call pt and schedule for lab and MD follow up beginning of June. Please call son and confirm time/date. Thank you.

## 2018-04-24 NOTE — Telephone Encounter (Signed)
Patient's son [name] called requesting appointment with VG 5/20 or the week of 6/1. Per son patient has had an emergency and saw VG in hospital and also a few years back and needs to be seen by him again. Son can be reached at [phone]. Message routed to [place].

## 2018-04-26 ENCOUNTER — Non-Acute Institutional Stay: Payer: Self-pay | Admitting: Internal Medicine

## 2018-04-26 ENCOUNTER — Telehealth: Payer: Self-pay | Admitting: *Deleted

## 2018-04-26 DIAGNOSIS — S2020XA Contusion of thorax, unspecified, initial encounter: Secondary | ICD-10-CM

## 2018-04-26 DIAGNOSIS — Z7189 Other specified counseling: Secondary | ICD-10-CM

## 2018-04-26 NOTE — Telephone Encounter (Signed)
Son [name] called requesting to talk to nurse.  Spoke with [name], and was informed that pt had 2 new bruises today - discovered by NP at the nursing home.   NP informed Tim that pt will have labs draw in the am.   [name] would like to hear from Dr. Lindi Adie re: 1.   Is it going to make any difference for pt to keep getting platelets ? 2.   Is it worth it for pt to go back to the hospital. ? [name] understood that Dr. Geralyn Flash nurse will contact him in the am with further instructions from Dr. Lindi Adie. [name]'s    Phone      [phone].

## 2018-04-27 ENCOUNTER — Telehealth: Payer: Self-pay

## 2018-04-27 NOTE — Telephone Encounter (Signed)
Attempted to call [facility] this morning to receive lab results for this patient. Was transferred to nursing station but unable to get through to reach someone. Dr. Lindi Adie would like to speak with patients son after receiving lab work from today.  Cyndia Bent RN

## 2018-04-30 ENCOUNTER — Telehealth: Payer: Self-pay | Admitting: Hematology and Oncology

## 2018-04-30 NOTE — Telephone Encounter (Signed)
Per VG/desk nurse (routed phone call response) scheduled patient for lab/fu 6/3. Spoke with patient's son [name] re appointment date/time.

## 2018-05-02 NOTE — Progress Notes (Signed)
PALLIATIVE CARE CONSULT VISIT   PATIENT NAME: [name] DOB: 1941-02-26 MRN: [id number]  PRIMARY CARE PROVIDER:   Dr. Virgel Bouquet  REFERRING PROVIDER:    Dr. Virgel Bouquet [place], [place]  [address]     RESPONSIBLE PARTY:   [name] [place]ar[place](son)  [phone]   RECOMMENDATIONS and PLAN:  1.  Contusion of trunk, initial encounter S20.20XA:  New onset, new location of ecchymosis in light of ITP.  New occurrences in other locations as well.  Suspected recurrent decrease of platelet level.  Recheck platelets and communicate results with son.   2.  Advanced Care Planning and discussion:  Goals of care have been re-discussed with son [name] who is interested in pt being comfortable.  He agrees with recheck of platelets and monitoring signs of recurrent acute ITP.  He still is mostly in favor of non-hospitalization.  Pt. Is in no distress at this time.  Per joint discussion with son and NP [name], will continue to monitor pt and lab values.  Pt. Is appropriate for Hospice care.    I spent 30 minutes providing this consultation,  from 3:30pm to 4:00pm at [facility]. More than 50% of the time in this consultation was spent coordinating communication with son [name] NP and faciity staff. (Reports that platelet values have decreased to 150 since return from the hospital.  HISTORY OF PRESENT ILLNESS:  Follow-up with [name].  New ecchymosis noted of the mid anterior chest, forearms and RLE.  No reports of external bleeding. Appetite ranges from 25-50% per staff members.  She remains bedbound and is total care. CODE STATUS: DNAR/DNI  PPS: 30% HOSPICE ELIGIBILITY/DIAGNOSIS:  YES/Chronic ITP/ Family is indecisive currently  PAST MEDICAL HISTORY:  Past Medical History:  Diagnosis Date  . Anemia   . Asthma   . Atrial fibrillation, persistent ([place]) 11/01/2013  . Breast cancer, stage 1 ([place]) 10/17/2011   s/p right lumpectomy and XRT  . Bronchitis   . Bruises easily   .  Chills   . Chronic anticoagulation, on Eliquis 11/01/2013  . Chronic bronchitis ([place])   . Chronic diastolic CHF (congestive heart failure) ([place])   . Constipation   . COPD (chronic obstructive pulmonary disease) ([place])   . Cough   . Dementia without behavioral disturbance   . Dyslipidemia   . Fracture of left lower leg 06/1972   "no surgery; just casted it"  . Generalized weakness   . Gout attack    ankle, then wrist and hands  . Gout without tophus   . Hernia   . History of nuclear stress test 02/09/2010   dipyridamole; normal pattern of perfusion in all regions with attenuation artifact in inferior region, low risk   . Hypertension   . Insomnia   . Morbid obesity ([place])   . On home oxygen therapy    "2L prn" (01/24/2014)  . OSA on CPAP   . Pneumonia 2012; 2014  . Protein calorie malnutrition ([place])   . Sarcoid   . Sarcoidosis   . Type II diabetes mellitus ([place])   . Uterine cancer [facility])    s/p hysterectomy  . Wheezing     SOCIAL HX:  Social History   Tobacco Use  . Smoking status: Never Smoker  . Smokeless tobacco: Never Used  Substance Use Topics  . Alcohol use: No    ALLERGIES:  Allergies  Allergen Reactions  . Contrast Media [Iodinated Diagnostic Agents] Nausea And Vomiting  . Pravachol Other (See  Comments)    Muscle pain     PERTINENT MEDICATIONS:  Outpatient Encounter Medications as of 04/26/2018  Medication Sig  . albuterol (PROVENTIL) (2.5 MG/3ML) 0.083% nebulizer solution Take by nebulization every 6 (six) hours as needed for wheezing or shortness of breath.  . Amino Acids-Protein Hydrolys (FEEDING SUPPLEMENT, PRO-STAT SUGAR FREE 64,) LIQD Take 30 mLs by mouth 2 (two) times daily.  . Calcium Carb-Cholecalciferol 500-200 MG-UNIT TABS Take 1 tablet by mouth 2 (two) times daily. Reported on 01/26/2016  . dexamethasone (DECADRON) 2 MG tablet 2 tablet po qday x 7 days then 1 tablet po qday x 7 days then 1/2 tablet po qday x 7 days  . diltiazem (CARDIZEM) 30 MG  tablet Take 0.5 tablets (15 mg total) by mouth every 8 (eight) hours.  . docusate sodium (COLACE) 100 MG capsule Take 100 mg by mouth daily as needed for mild constipation.  Marland Kitchen donepezil (ARICEPT) 10 MG tablet Take 10 mg by mouth at bedtime.  . furosemide (LASIX) 20 MG tablet Take 0.5 tablets (10 mg total) by mouth daily.  . insulin glargine (LANTUS) 100 UNIT/ML injection Inject 0.1 mLs (10 Units total) into the skin at bedtime.  Marland Kitchen lactulose (CHRONULAC) 10 GM/15ML solution Take by mouth daily. Give 15 mL by mouth  . metoCLOPramide (REGLAN) 5 MG tablet Take 5 mg by mouth 2 (two) times daily.  Marland Kitchen mouth rinse LIQD solution 15 mLs by Mouth Rinse route 2 (two) times daily.  . NYSTATIN powder Apply Nystatin powder under breast and elbow fold 3 times daily for 2 weeks  . ondansetron (ZOFRAN) 8 MG tablet Take 8 mg by mouth every 8 (eight) hours as needed for nausea or vomiting.  . OXYGEN Inhale 2 L/min into the lungs continuous.   Marland Kitchen Propylene Glycol (SYSTANE COMPLETE OP) Place 1 drop into both eyes 3 (three) times daily.  . SYMBICORT 160-4.5 MCG/ACT inhaler Inhale 2 puffs into the lungs 2 (two) times daily. Rinse mouth well after use  . venlafaxine XR (EFFEXOR-XR) 37.5 MG 24 hr capsule Take 37.5 mg by mouth daily with breakfast.  . Vitamin D, Ergocalciferol, (DRISDOL) 50000 units CAPS capsule Take 50,000 Units by mouth every 28 (twenty-eight) days.   No facility-administered encounter medications on file as of 04/26/2018.     PHYSICAL EXAM:   General: Obese elderly female resting in bed and in NAD Cardiovascular: regular rate and rhythm Pulmonary: clear ant fields Abdomen: soft, nontender, + bowel sounds [id number] Extremities: no edema Skin: Varied stages of purpura of mid chest, forearms and proximal lower extremities Neurological: Alert but unable to determine orientation due to cognitive deficits.  Gonzella Lex, NP-C

## 2018-05-04 ENCOUNTER — Encounter: Payer: Self-pay | Admitting: Internal Medicine

## 2018-05-04 DIAGNOSIS — S2020XA Contusion of thorax, unspecified, initial encounter: Secondary | ICD-10-CM | POA: Insufficient documentation

## 2018-05-08 ENCOUNTER — Ambulatory Visit: Payer: Medicare Other | Admitting: Internal Medicine

## 2018-05-09 ENCOUNTER — Ambulatory Visit (INDEPENDENT_AMBULATORY_CARE_PROVIDER_SITE_OTHER): Payer: Medicare Other | Admitting: Internal Medicine

## 2018-05-09 ENCOUNTER — Encounter: Payer: Self-pay | Admitting: Internal Medicine

## 2018-05-09 VITALS — BP 103/68 | HR 87 | Ht 60.0 in | Wt 189.4 lb

## 2018-05-09 DIAGNOSIS — I272 Pulmonary hypertension, unspecified: Secondary | ICD-10-CM

## 2018-05-09 DIAGNOSIS — R609 Edema, unspecified: Secondary | ICD-10-CM

## 2018-05-09 DIAGNOSIS — I482 Chronic atrial fibrillation, unspecified: Secondary | ICD-10-CM

## 2018-05-09 DIAGNOSIS — D696 Thrombocytopenia, unspecified: Secondary | ICD-10-CM

## 2018-05-09 NOTE — Patient Instructions (Signed)
Medication Instructions:   Continue current medications  Labwork:  NONE  Testing/Procedures:  NONE  Follow-Up:  Your physician wants you to follow-up in: ONE YEAR with Dr. Debara Pickett. You will receive a reminder letter in the mail two months in advance. If you don't receive a letter, please call our office to schedule the follow-up appointment.   If you need a refill on your cardiac medications before your next appointment, please call your pharmacy.  Any Other Special Instructions Will Be Listed Below (If Applicable).

## 2018-05-09 NOTE — Progress Notes (Signed)
OFFICE NOTE  Chief Complaint:  No complaints  Primary Care Physician: [name], DO  HPI:  [name] is a 77 year old female who is morbidly obese and recently had bilateral knee replacement. She also has a history of dyslipidemia, hypertension, sleep apnea, diabetes type 2, sarcoidosis, paroxysmal A-fib on Eliquis.  Unfortunately, she also had breast cancer which I understand she is now in remission for. She was recently admitted to the hospital from skilled nursing facility as she was found to be bradycardic with HR in the 40s. Per pt, this has been associated with dyspnea on exertion and at rest, 2-3 pillow orthopnea, progressively worsening LE swelling. Her pulmonologist increased the dose of Lasix to 40 mg BID but she has not started this piror to this admission. She was found to be in acute diastolic heart failure and was diureses. Her breathing had improved however she was having persistent cough and she was on an ACE inhibitor which was changed to an ARB. She improved fairly quickly with diuresis and was discharged with a weight of 244 pounds. Her weight on hospital follow-up was 236 pounds.  She has chronic dyspnea which is not any worse than it was when she was discharged.  Was recently contacted by home health who noted that she's had progressive increase in her weight over the past 2 months. Specifically, over the last week she's had an increase in her weight now up to 260 pounds. This is associated with marked increase in shortness of breath and abdominal fullness with difficulty breathing. She's had progressively increasing doses of Lasix were divided over the past week including increased to 80 mg in the morning and 40 mg at night and most recently 80 mg twice daily, however she did not report an increase in urination and her weight continues to increase.  [name] was recently hospitalized for  Atypical chest pain and a number of other complaints. She was in A. fib  with RVR. Cardiology was consult and no further workup was recommended. Her diuretics were adjusted and currently she is on 40 mg daily of Lasix. Weight is stable compared to her hospital discharge. Blood pressure is well-controlled today. She appears essentially wheelchair-bound. She is in a nursing home.  04/25/2016  [name] returns today for follow-up. She is now at [facility]. She appears to have declined somewhat since her last office visit. She is now essentially wheelchair-bound. She appears pale although recent laboratory work shows no evidence of anemia. In March her hemoglobin A1c was 4.8, H&H of 11 and 37, creatinine of 0.93. Weight today was 232 pounds at the nursing home but she was unable to be weighed here because she's in her wheelchair. She does carry a diagnosis of dementia and depression. She seemed quite flat today. She has significant hearing loss. She denies any chest pain or shortness of breath which is worse when she is off of her oxygen which is 2 L continuous. Her atrial fibrillation is rate controlled and she is on Eliquis 5 mg twice a day as well as diltiazem.  05/10/2017  [name] returns today for follow-up. She continues to be a [employer]. She is accompanied by her son today. She's had some worsening dementia. Amazingly she's had about 45 pound weight loss since I last saw her. Is is attributed to dietary changes. Apparently they've been working with a nutritionist. She's had some reduction in blood pressure medications and actually has a very normal blood pressure today  122/64. There are no acute complaints. She remains on continuous oxygen but was not wearing it today. There is no concern for any GI bleeding on Eliquis. She remains in persistent atrial fibrillation which is likely permanent at this point.  05/09/2018  [name] was seen today for annual follow-up.  Reports that she was recently hospitalized with bleeding and thrombocytopenia.  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   She received some steroids.  She was taken off of Eliquis which she had been on for A. fib.  She is a long-term resident of [facility].  Recently she has had some weight loss although has some appetite.  She has dementia and is not particularly talkative.  She is noted to be in her wheelchair today and has diffuse ecchymosis.  There is a phone call regarding whether she should be on aspirin or not.  My position is that I recommend low-dose aspirin 81 mg daily unless her platelet count dropped below 50,000.  PMHx:  Past Medical History:  Diagnosis Date  . Anemia   . Asthma   . Atrial fibrillation, persistent ([place]) 11/01/2013  . Breast cancer, stage 1 ([place]) 10/17/2011   s/p right lumpectomy and XRT  . Bronchitis   . Bruises easily   . Chills   . Chronic anticoagulation, on Eliquis 11/01/2013  . Chronic bronchitis ([place])   . Chronic diastolic CHF (congestive heart failure) ([place])   . Constipation   . COPD (chronic obstructive pulmonary disease) ([place])   . Cough   . Dementia without behavioral disturbance   . Dyslipidemia   . Fracture of left lower leg 06/1972   "no surgery; just casted it"  . Generalized weakness   . Gout attack    ankle, then wrist and hands  . Gout without tophus   . Hernia   . History of nuclear stress test 02/09/2010   dipyridamole; normal pattern of perfusion in all regions with attenuation artifact in inferior region, low risk   . Hypertension   . Insomnia   . Morbid obesity ([place])   . On home oxygen therapy    "2L prn" (01/24/2014)  . OSA on CPAP   . Pneumonia 2012; 2014  . Protein calorie malnutrition ([place])   . Sarcoid   . Sarcoidosis   . Type II diabetes mellitus ([place])   . Uterine cancer [facility])    s/p hysterectomy  . Wheezing     Past Surgical History:  Procedure Laterality Date  . ABDOMINAL HYSTERECTOMY  02/04/2005  . BREAST BIOPSY Right 01/2011  . BREAST LUMPECTOMY Right 01/2011  . FLEXIBLE SIGMOIDOSCOPY N/A 04/16/2018   Procedure: FLEXIBLE  SIGMOIDOSCOPY;  Surgeon: Otis Brace, MD;  Location: [facility] ENDOSCOPY;  Service: Gastroenterology;  Laterality: N/A;  No sedation   . HERNIA REPAIR    . TOTAL KNEE ARTHROPLASTY Bilateral 03/2010; 06/2011   left; right  . TRANSTHORACIC ECHOCARDIOGRAM  07/2008   EF, LV size is normal; RVSP normal; mod calcif of MV apparatus  . TUBAL LIGATION  1970's  . UMBILICAL HERNIA REPAIR  02/04/2005    FAMHx:  Family History  Problem Relation Age of Onset  . Diabetes Mother   . Colon cancer Father   . Diabetes Brother   . Diabetes Sister   . Lung cancer Brother   . Cervical cancer Sister   . Thrombocytopenia Neg Hx   . Bleeding Disorder Neg Hx     SOCHx:   reports that she has never smoked. She has never used smokeless tobacco. She  reports that she does not drink alcohol or use drugs.  ALLERGIES:  Allergies  Allergen Reactions  . Contrast Media [Iodinated Diagnostic Agents] Nausea And Vomiting  . Pravachol Other (See Comments)    Muscle pain    ROS: A comprehensive review of systems was negative.  HOME MEDS: Current Outpatient Medications  Medication Sig Dispense Refill  . albuterol (PROVENTIL) (2.5 MG/3ML) 0.083% nebulizer solution Take by nebulization every 6 (six) hours as needed for wheezing or shortness of breath.    Marland Kitchen aspirin EC 81 MG tablet Take 81 mg by mouth daily.    . Calcium Carb-Cholecalciferol 500-200 MG-UNIT TABS Take 1 tablet by mouth 2 (two) times daily. Reported on 01/26/2016    . diltiazem (CARDIZEM) 30 MG tablet Take 0.5 tablets (15 mg total) by mouth every 8 (eight) hours. 45 tablet 0  . docusate sodium (COLACE) 100 MG capsule Take 100 mg by mouth daily as needed for mild constipation.    Marland Kitchen donepezil (ARICEPT) 10 MG tablet Take 10 mg by mouth at bedtime.    . furosemide (LASIX) 20 MG tablet Take 0.5 tablets (10 mg total) by mouth daily. 15 tablet 0  . insulin glargine (LANTUS) 100 UNIT/ML injection Inject 0.1 mLs (10 Units total) into the skin at bedtime. 10 mL 11    . lactulose (CHRONULAC) 10 GM/15ML solution Take by mouth daily. Give 15 mL by mouth    . metoCLOPramide (REGLAN) 5 MG tablet Take 5 mg by mouth 2 (two) times daily.    . ondansetron (ZOFRAN) 8 MG tablet Take 8 mg by mouth every 8 (eight) hours as needed for nausea or vomiting.    . OXYGEN Inhale 2 L/min into the lungs continuous.     Marland Kitchen Propylene Glycol (SYSTANE COMPLETE OP) Place 1 drop into both eyes 3 (three) times daily.    . SYMBICORT 160-4.5 MCG/ACT inhaler Inhale 2 puffs into the lungs 2 (two) times daily. Rinse mouth well after use    . terbinafine (LAMISIL) 1 % cream Apply 1 application topically 2 (two) times daily.    Marland Kitchen venlafaxine XR (EFFEXOR-XR) 37.5 MG 24 hr capsule Take 37.5 mg by mouth daily with breakfast.    . Vitamin D, Ergocalciferol, (DRISDOL) 50000 units CAPS capsule Take 50,000 Units by mouth every 28 (twenty-eight) days.     No current facility-administered medications for this visit.     LABS/IMAGING: No results found for this or any previous visit (from the past 48 hour(s)). No results found.  VITALS: BP 103/68   Pulse 87   Ht 5' (1.524 m)   Wt 189 lb 6.4 oz (85.9 kg) Comment: 04/26/18 per nursing home record  BMI 36.99 kg/m   EXAM: General appearance: alert, mild distress, morbidly obese and shallow breathing Neck: no carotid bruit and no JVD Lungs: diminished breath sounds bilaterally Heart: irregularly irregular rhythm Abdomen: soft, non-tender; bowel sounds normal; no masses,  no organomegaly Extremities: Right lower extremity edema Pulses: 2+ and symmetric Skin: Skin color, texture, turgor normal. No rashes or lesions Neurologic: Grossly normal Psych: Flat affect  EKG: A. fib with competing junctional pacemaker at 87-personally reviewed  ASSESSMENT: 1. Chronic diastolic congestive heart failure, NYHA Class III symptoms 2. Persistent atrial fibrillation -on low-dose aspirin 3. Hypertension 4. Diabetes type 2 - on insulin 5. Morbid  obesity 6. Obstructive sleep apnea 7. Sarcoidosis 8. Pulmonary hypertension on chronic 2 L nasal cannula oxygen 9. Dyslipidemia 10. Wheelchair-bound 11. 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   Mrs. Doyle Askew  has been hospitalized a couple times for bleeding and thrombocytopenia.  She is now off of Eliquis.  She struggled with low platelets and ITP and I recommend continue to try to use low-dose aspirin for stroke prevention unless platelets drop below 50,000.  She is essentially wheelchair-bound with progressive dementia although it does have an appetite and vitals are stable.  No medication changes were made today.  She can see me back annually or sooner as necessary.  Pixie Casino, MD, [facility], [place] Director of the [facility]  [facility] Diplomate of the American Board of Clinical Lipidology Attending Cardiologist  Direct Dial: [phone]  Fax: [phone]  Website:  [email or web address] [email or web address] 05/09/2018, 10:05 AM

## 2018-05-11 ENCOUNTER — Other Ambulatory Visit: Payer: Self-pay

## 2018-05-11 DIAGNOSIS — C50311 Malignant neoplasm of lower-inner quadrant of right female breast: Secondary | ICD-10-CM

## 2018-05-14 ENCOUNTER — Inpatient Hospital Stay: Payer: Medicare Other

## 2018-05-14 ENCOUNTER — Inpatient Hospital Stay: Payer: Medicare Other | Attending: Hematology and Oncology | Admitting: Hematology and Oncology

## 2018-05-14 VITALS — BP 125/70 | HR 95 | Temp 97.9°F | Resp 16 | Ht 60.0 in

## 2018-05-14 DIAGNOSIS — Z79899 Other long term (current) drug therapy: Secondary | ICD-10-CM | POA: Diagnosis not present

## 2018-05-14 DIAGNOSIS — Z170 Estrogen receptor positive status [ER+]: Secondary | ICD-10-CM | POA: Diagnosis not present

## 2018-05-14 DIAGNOSIS — Z923 Personal history of irradiation: Secondary | ICD-10-CM | POA: Insufficient documentation

## 2018-05-14 DIAGNOSIS — C50311 Malignant neoplasm of lower-inner quadrant of right female breast: Secondary | ICD-10-CM | POA: Diagnosis present

## 2018-05-14 DIAGNOSIS — Z79811 Long term (current) use of aromatase inhibitors: Secondary | ICD-10-CM | POA: Diagnosis not present

## 2018-05-14 DIAGNOSIS — Z794 Long term (current) use of insulin: Secondary | ICD-10-CM | POA: Insufficient documentation

## 2018-05-14 DIAGNOSIS — Z7982 Long term (current) use of aspirin: Secondary | ICD-10-CM | POA: Diagnosis not present

## 2018-05-14 DIAGNOSIS — M810 Age-related osteoporosis without current pathological fracture: Secondary | ICD-10-CM | POA: Diagnosis not present

## 2018-05-14 DIAGNOSIS — D693 Immune thrombocytopenic purpura: Secondary | ICD-10-CM | POA: Insufficient documentation

## 2018-05-14 DIAGNOSIS — E119 Type 2 diabetes mellitus without complications: Secondary | ICD-10-CM | POA: Diagnosis not present

## 2018-05-14 DIAGNOSIS — R600 Localized edema: Secondary | ICD-10-CM | POA: Insufficient documentation

## 2018-05-14 LAB — CMP (CANCER CENTER ONLY)
ALBUMIN: 2.6 g/dL — AB (ref 3.5–5.0)
ALK PHOS: 66 U/L (ref 40–150)
ALT: 12 U/L (ref 0–55)
ANION GAP: 7 (ref 3–11)
AST: 13 U/L (ref 5–34)
BILIRUBIN TOTAL: 0.5 mg/dL (ref 0.2–1.2)
BUN: 21 mg/dL (ref 7–26)
CALCIUM: 8.8 mg/dL (ref 8.4–10.4)
CO2: 31 mmol/L — ABNORMAL HIGH (ref 22–29)
Chloride: 101 mmol/L (ref 98–109)
Creatinine: 0.71 mg/dL (ref 0.60–1.10)
GFR, Est AFR Am: >60 mL/min (ref >60)
GFR, Estimated: >60 mL/min (ref >60)
GLUCOSE: 204 mg/dL — AB (ref 70–140)
Potassium: 4.4 mmol/L (ref 3.5–5.1)
Sodium: 139 mmol/L (ref 136–145)
TOTAL PROTEIN: 6.1 g/dL — AB (ref 6.4–8.3)

## 2018-05-14 LAB — CBC WITH DIFFERENTIAL (CANCER CENTER ONLY)
BASOS ABS: 0.2 10*3/uL — AB (ref 0.0–0.1)
BASOS PCT: 2 %
EOS PCT: 3 %
Eosinophils Absolute: 0.3 10*3/uL (ref 0.0–0.5)
HEMATOCRIT: 36.8 % (ref 34.8–46.6)
Hemoglobin: 11.9 g/dL (ref 11.6–15.9)
Lymphocytes Relative: 15 %
Lymphs Abs: 1.4 10*3/uL (ref 0.9–3.3)
MCH: 25.1 pg (ref 25.1–34.0)
MCHC: 32.5 g/dL (ref 31.5–36.0)
MCV: 77.4 fL — ABNORMAL LOW (ref 79.5–101.0)
MONO ABS: 0.7 10*3/uL (ref 0.1–0.9)
Monocytes Relative: 8 %
NEUTROS ABS: 7 10*3/uL — AB (ref 1.5–6.5)
Neutrophils Relative %: 72 %
PLATELETS: 178 10*3/uL (ref 145–400)
RBC: 4.75 MIL/uL (ref 3.70–5.45)
RDW: 18 % — AB (ref 11.2–14.5)
WBC: 9.6 10*3/uL (ref 3.9–10.3)

## 2018-05-14 NOTE — Assessment & Plan Note (Signed)
Patient had 2 relapses of acute ITP requiring hospitalization and supportive care including IVIG and dexamethasone. The second hospitalization we gave her a tapering schedule for the dexamethasone.  This was completed on 05/08/2018.  Lab review: Today's CBC revealed a platelet count of Patient sounds spent a lot of time discussing goals of care with me.  He and his brother are interested in comfort care measures.  Patient is under palliative care already.  If she gets more weaker the plan to transition her to hospice care.  Because of this reason, I did not recommend any further routine CBC testing in the future.

## 2018-05-14 NOTE — Progress Notes (Signed)
Patient Care Team: [name], DO as PCP - General (Family Medicine)  DIAGNOSIS:  Encounter Diagnoses  Name Primary?  . Malignant neoplasm of lower-inner quadrant of right breast of female, estrogen receptor positive ([place]) Yes  . 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, ER 100%, PR present, Ki-67 80%, HER-2 negative ratio 1.06      02/08/2011 Surgery    Right breast lumpectomy: Benign breast parenchyma with fibroadenomatoid nodules, 2 SLN negative, based on biopsy size tumor size 0.8 and 0.6 cm      03/28/2011 - 04/25/2011 Radiation Therapy    Adjuvant radiation therapy to the breast      06/03/2011 -  Anti-estrogen oral therapy     Aromasin 25 mg daily: For osteoporosis patient is taking Fosamax       CHIEF COMPLIANT: Follow-up of acute ITP  INTERVAL HISTORY: [name] is a 77 year old with above-mentioned history of breast cancer and acute ITP who was recently hospitalized for a second relapse of ITP.  She was given IV dexamethasone along with IVIG and she was sent home with a dexamethasone tapering schedule.  She finished the taper on 05/08/2018.  She still has extensive bruises throughout her body but her platelet count has improved.  The plan is to proceed with comfort care measures in the future.  REVIEW OF SYSTEMS:   Constitutional: Denies fevers, chills or abnormal weight loss Eyes: Denies blurriness of vision Ears, nose, mouth, throat, and face: Denies mucositis or sore throat Respiratory: Denies cough, dyspnea or wheezes Cardiovascular: Denies palpitation, chest discomfort Gastrointestinal:  Denies nausea, heartburn or change in bowel habits Skin: Extensive bruises on her hands Lymphatics: Denies new lymphadenopathy or easy  bruising Neurological:Denies numbness, tingling or new weaknesses Behavioral/Psych: Mood is stable, no new changes  Extremities: 2+ lower extremity edema  All other systems were reviewed with the patient and are negative.  I have reviewed the past medical history, past surgical history, social history and family history with the patient and they are unchanged from previous note.  ALLERGIES:  is allergic to contrast media [iodinated diagnostic agents] and pravachol.  MEDICATIONS:  Current Outpatient Medications  Medication Sig Dispense Refill  . albuterol (PROVENTIL) (2.5 MG/3ML) 0.083% nebulizer solution Take by nebulization every 6 (six) hours as needed for wheezing or shortness of breath.    Marland Kitchen aspirin EC 81 MG tablet Take 81 mg by mouth daily.    . Calcium Carb-Cholecalciferol 500-200 MG-UNIT TABS Take 1 tablet by mouth 2 (two) times daily. Reported on 01/26/2016    . diltiazem (CARDIZEM) 30 MG tablet Take 0.5 tablets (15 mg total) by mouth every 8 (eight) hours. 45 tablet 0  . docusate sodium (COLACE) 100 MG capsule Take 100 mg by mouth daily as needed for mild constipation.    Marland Kitchen donepezil (ARICEPT) 10 MG tablet Take 10 mg by mouth at bedtime.    . furosemide (LASIX) 20 MG tablet Take 0.5 tablets (10 mg total) by mouth daily. 15 tablet 0  . insulin glargine (LANTUS) 100 UNIT/ML injection Inject 0.1 mLs (10 Units total) into the skin at bedtime. 10 mL 11  . lactulose (CHRONULAC) 10 GM/15ML solution Take by mouth daily. Give 15 mL by mouth    . metoCLOPramide (REGLAN) 5 MG tablet Take  5 mg by mouth 2 (two) times daily.    . ondansetron (ZOFRAN) 8 MG tablet Take 8 mg by mouth every 8 (eight) hours as needed for nausea or vomiting.    . OXYGEN Inhale 2 L/min into the lungs continuous.     Marland Kitchen Propylene Glycol (SYSTANE COMPLETE OP) Place 1 drop into both eyes 3 (three) times daily.    . SYMBICORT 160-4.5 MCG/ACT inhaler Inhale 2 puffs into the lungs 2 (two) times daily. Rinse mouth well after  use    . terbinafine (LAMISIL) 1 % cream Apply 1 application topically 2 (two) times daily.    Marland Kitchen venlafaxine XR (EFFEXOR-XR) 37.5 MG 24 hr capsule Take 37.5 mg by mouth daily with breakfast.    . Vitamin D, Ergocalciferol, (DRISDOL) 50000 units CAPS capsule Take 50,000 Units by mouth every 28 (twenty-eight) days.     No current facility-administered medications for this visit.     PHYSICAL EXAMINATION: ECOG PERFORMANCE STATUS: 3 - Symptomatic, >50% confined to bed  Vitals:   05/14/18 0959  BP: 125/70  Pulse: 95  Resp: 16  Temp: 97.9 F (36.6 C)  SpO2: 100%   Filed Weights    GENERAL:alert, no distress and comfortable SKIN:  bruises on her hands EYES: normal, Conjunctiva are pink and non-injected, sclera clear OROPHARYNX:no exudate, no erythema and lips, buccal mucosa, and tongue normal  NECK: supple, thyroid normal size, non-tender, without nodularity LYMPH:  no palpable lymphadenopathy in the cervical, axillary or inguinal LUNGS: clear to auscultation and percussion with normal breathing effort HEART: regular rate & rhythm and no murmurs and no lower extremity edema ABDOMEN:abdomen soft, non-tender and normal bowel sounds MUSCULOSKELETAL:no cyanosis of digits and no clubbing  NEURO: alert & oriented x 3 with fluent speech, no focal motor/sensory deficits EXTREMITIES: No lower extremity edema  LABORATORY DATA:  I have reviewed the data as listed CMP Latest Ref Rng & Units 05/14/2018 04/17/2018 04/16/2018  Glucose 70 - 140 mg/dL 204(H) 308(H) 326(H)  BUN 7 - 26 mg/dL 21 50(H) 55(H)  Creatinine 0.60 - 1.10 mg/dL 0.71 0.81 0.77  Sodium 136 - 145 mmol/L 139 133(L) 136  Potassium 3.5 - 5.1 mmol/L 4.4 4.7 4.8  Chloride 98 - 109 mmol/L 101 99(L) 101  CO2 22 - 29 mmol/L 31(H) 25 25  Calcium 8.4 - 10.4 mg/dL 8.8 8.2(L) 8.4(L)  Total Protein 6.4 - 8.3 g/dL 6.1(L) 7.2 7.6  Total Bilirubin 0.2 - 1.2 mg/dL 0.5 0.5 0.4  Alkaline Phos 40 - 150 U/L 66 52 44  AST 5 - 34 U/L 13 16 16   ALT  0 - 55 U/L 12 14 14     Lab Results  Component Value Date   WBC 9.6 05/14/2018   HGB 11.9 05/14/2018   HCT 36.8 05/14/2018   MCV 77.4 (L) 05/14/2018   PLT 178 05/14/2018   NEUTROABS 7.0 (H) 05/14/2018    ASSESSMENT & PLAN:  Acute ITP ([place]) Patient had 2 relapses of acute ITP requiring hospitalization and supportive care including IVIG and dexamethasone. The second hospitalization we gave her a tapering schedule for the dexamethasone.  This was completed on 05/08/2018.  Lab review: Today's CBC revealed a platelet count of 178 Patient sounds spent a lot of time discussing goals of care with me.  He and his brother are interested in comfort care measures.  Patient is under palliative care already.  If she gets more weaker the plan to transition her to hospice care.  Because of this reason, I did  not recommend any further routine CBC testing in the future.  Right breast cancer: No further surveillance is necessary  Goals of care: Moving towards comfort care only.  I instructed the patient's son to call if they have any questions or concerns.  No orders of the defined types were placed in this encounter.  The patient has a good understanding of the overall plan. she agrees with it. she will call with any problems that may develop before the next visit here.   Harriette Ohara, MD 05/14/18

## 2019-05-10 ENCOUNTER — Ambulatory Visit: Payer: Medicare Other | Admitting: Internal Medicine

## 2019-08-01 ENCOUNTER — Ambulatory Visit: Payer: Medicare Other | Admitting: Internal Medicine

## 2019-11-26 ENCOUNTER — Telehealth: Payer: Self-pay | Admitting: Internal Medicine

## 2019-11-26 NOTE — Telephone Encounter (Signed)
LM for patient to call back to discuss 12/12/2019 visit - this is a virtual AM clinic only. Asked that she call back w/decision to keep this appointment OR r/s for in-office visit.

## 2019-12-12 ENCOUNTER — Ambulatory Visit: Payer: Medicare Other | Admitting: Internal Medicine

## 2020-02-27 ENCOUNTER — Ambulatory Visit: Payer: Medicare Other | Admitting: Internal Medicine

## 2020-04-24 ENCOUNTER — Ambulatory Visit: Payer: Medicare Other | Admitting: Internal Medicine

## 2020-06-23 ENCOUNTER — Other Ambulatory Visit: Payer: Self-pay

## 2020-06-23 ENCOUNTER — Encounter: Payer: Self-pay | Admitting: Internal Medicine

## 2020-06-23 ENCOUNTER — Ambulatory Visit (INDEPENDENT_AMBULATORY_CARE_PROVIDER_SITE_OTHER): Payer: Medicare Other | Admitting: Internal Medicine

## 2020-06-23 VITALS — BP 116/62 | HR 87 | Wt 178.2 lb

## 2020-06-23 DIAGNOSIS — I10 Essential (primary) hypertension: Secondary | ICD-10-CM

## 2020-06-23 DIAGNOSIS — I5032 Chronic diastolic (congestive) heart failure: Secondary | ICD-10-CM

## 2020-06-23 DIAGNOSIS — F0150 Vascular dementia without behavioral disturbance: Secondary | ICD-10-CM | POA: Diagnosis not present

## 2020-06-23 DIAGNOSIS — I482 Chronic atrial fibrillation, unspecified: Secondary | ICD-10-CM

## 2020-06-23 NOTE — Progress Notes (Signed)
OFFICE NOTE  Chief Complaint:  No complaints  Primary Care Physician: [name], DO  HPI:  [name] is a 79 year old female who is morbidly obese and recently had bilateral knee replacement. She also has a history of dyslipidemia, hypertension, sleep apnea, diabetes type 2, sarcoidosis, paroxysmal A-fib on Eliquis.  Unfortunately, she also had breast cancer which I understand she is now in remission for. She was recently admitted to the hospital from skilled nursing facility as she was found to be bradycardic with HR in the 40s. Per pt, this has been associated with dyspnea on exertion and at rest, 2-3 pillow orthopnea, progressively worsening LE swelling. Her pulmonologist increased the dose of Lasix to 40 mg BID but she has not started this piror to this admission. She was found to be in acute diastolic heart failure and was diureses. Her breathing had improved however she was having persistent cough and she was on an ACE inhibitor which was changed to an ARB. She improved fairly quickly with diuresis and was discharged with a weight of 244 pounds. Her weight on hospital follow-up was 236 pounds.  She has chronic dyspnea which is not any worse than it was when she was discharged.  Was recently contacted by home health who noted that she's had progressive increase in her weight over the past 2 months. Specifically, over the last week she's had an increase in her weight now up to 260 pounds. This is associated with marked increase in shortness of breath and abdominal fullness with difficulty breathing. She's had progressively increasing doses of Lasix were divided over the past week including increased to 80 mg in the morning and 40 mg at night and most recently 80 mg twice daily, however she did not report an increase in urination and her weight continues to increase.  Mrs. Lovett was recently hospitalized for  Atypical chest pain and a number of other complaints. She was in A. fib  with RVR. Cardiology was consult and no further workup was recommended. Her diuretics were adjusted and currently she is on 40 mg daily of Lasix. Weight is stable compared to her hospital discharge. Blood pressure is well-controlled today. She appears essentially wheelchair-bound. She is in a nursing home.  04/25/2016  Mrs. Manalang returns today for follow-up. She is now at [facility]. She appears to have declined somewhat since her last office visit. She is now essentially wheelchair-bound. She appears pale although recent laboratory work shows no evidence of anemia. In March her hemoglobin A1c was 4.8, H&H of 11 and 37, creatinine of 0.93. Weight today was 232 pounds at the nursing home but she was unable to be weighed here because she's in her wheelchair. She does carry a diagnosis of dementia and depression. She seemed quite flat today. She has significant hearing loss. She denies any chest pain or shortness of breath which is worse when she is off of her oxygen which is 2 L continuous. Her atrial fibrillation is rate controlled and she is on Eliquis 5 mg twice a day as well as diltiazem.  05/10/2017  Mrs. Bouchard returns today for follow-up. She continues to be a [employer]. She is accompanied by her son today. She's had some worsening dementia. Amazingly she's had about 45 pound weight loss since I last saw her. Is is attributed to dietary changes. Apparently they've been working with a nutritionist. She's had some reduction in blood pressure medications and actually has a very normal blood pressure today  122/64. There are no acute complaints. She remains on continuous oxygen but was not wearing it today. There is no concern for any GI bleeding on Eliquis. She remains in persistent atrial fibrillation which is likely permanent at this point.  05/09/2018  Mrs. Schirm was seen today for annual follow-up.  Reports that she was recently hospitalized with bleeding and thrombocytopenia.  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   She received some steroids.  She was taken off of Eliquis which she had been on for A. fib.  She is a long-term resident of [facility].  Recently she has had some weight loss although has some appetite.  She has dementia and is not particularly talkative.  She is noted to be in her wheelchair today and has diffuse ecchymosis.  There is a phone call regarding whether she should be on aspirin or not.  My position is that I recommend low-dose aspirin 81 mg daily unless her platelet count dropped below 50,000.  06/23/2020  Mrs. Connon returns for follow-up.  She is accompanied by her son.  Overall there is been no new issue.  She has received both Covid vaccination.  She continues to be a long-term resident candidate in place.  Memory has declined significantly.  She denies any recent bleeding problems or remains on aspirin because of ITP and low platelet count.  She also has longstanding persistent if not permanent A. fib which is rate controlled.  There is no significant lower extremity edema and she remains on a low-dose of a diuretic.  Weight has been fairly stable.   PMHx:  Past Medical History:  Diagnosis Date   Anemia    Asthma    Atrial fibrillation, persistent ([place]) 11/01/2013   Breast cancer, stage 1 ([place]) 10/17/2011   s/p right lumpectomy and XRT   Bronchitis    Bruises easily    Chills    Chronic anticoagulation, on Eliquis 11/01/2013   Chronic bronchitis (HCC)    Chronic diastolic CHF (congestive heart failure) (HCC)    Constipation    COPD (chronic obstructive pulmonary disease) (HCC)    Cough    Dementia without behavioral disturbance ([place])    Dyslipidemia    Fracture of left lower leg 06/1972   "no surgery; just casted it"   Generalized weakness    Gout attack    ankle, then wrist and hands   Gout without tophus    Hernia    History of nuclear stress test 02/09/2010   dipyridamole; normal pattern of perfusion in all regions with attenuation artifact in  inferior region, low risk    Hypertension    Insomnia    Morbid obesity ([place])    On home oxygen therapy    "2L prn" (01/24/2014)   OSA on CPAP    Pneumonia 2012; 2014   Protein calorie malnutrition ([place])    Sarcoid    Sarcoidosis    Type II diabetes mellitus ([place])    Uterine cancer ([place])    s/p hysterectomy   Wheezing     Past Surgical History:  Procedure Laterality Date   ABDOMINAL HYSTERECTOMY  02/04/2005   BREAST BIOPSY Right 01/2011   BREAST LUMPECTOMY Right 01/2011   FLEXIBLE SIGMOIDOSCOPY N/A 04/16/2018   Procedure: FLEXIBLE SIGMOIDOSCOPY;  Surgeon: Otis Brace, MD;  Location: [facility];  Service: Gastroenterology;  Laterality: N/A;  No sedation    HERNIA REPAIR     TOTAL KNEE ARTHROPLASTY Bilateral 03/2010; 06/2011   left; right   TRANSTHORACIC ECHOCARDIOGRAM  07/2008  EF, LV size is normal; RVSP normal; mod calcif of MV apparatus   TUBAL LIGATION  5170'Y   UMBILICAL HERNIA REPAIR  02/04/2005    FAMHx:  Family History  Problem Relation Age of Onset   Diabetes Mother    Colon cancer Father    Diabetes Brother    Diabetes Sister    Lung cancer Brother    Cervical cancer Sister    Thrombocytopenia Neg Hx    Bleeding Disorder Neg Hx     SOCHx:   reports that she has never smoked. She has never used smokeless tobacco. She reports that she does not drink alcohol and does not use drugs.  ALLERGIES:  Allergies  Allergen Reactions   Contrast Media [Iodinated Diagnostic Agents] Nausea And Vomiting   Pravachol Other (See Comments)    Muscle pain    ROS: A comprehensive review of systems was negative.  HOME MEDS: Current Outpatient Medications  Medication Sig Dispense Refill   acetaminophen (TYLENOL) 650 MG suppository Place 650 mg rectally every 4 (four) hours as needed.     albuterol (PROVENTIL) (2.5 MG/3ML) 0.083% nebulizer solution Take by nebulization every 6 (six) hours as needed for wheezing or shortness of breath.       Amino Acids-Protein Hydrolys (PRO-STAT PO) Take 30 mLs by mouth in the morning and at bedtime.     aspirin EC 81 MG tablet Take 81 mg by mouth daily.     bisacodyl (DULCOLAX) 10 MG suppository Place 10 mg rectally daily as needed for moderate constipation.     budesonide (PULMICORT) 0.5 MG/2ML nebulizer solution Take 0.5 mg by nebulization 2 (two) times daily.     calcium-vitamin D (OSCAL WITH D) 500-200 MG-UNIT tablet Take 1 tablet by mouth daily with breakfast.     Dextran 70-Hypromellose, PF, 0.1-0.3 % SOLN Apply 2 drops to eye in the morning, at noon, and at bedtime.     docusate sodium (COLACE) 100 MG capsule Take 100 mg by mouth daily as needed for mild constipation.     donepezil (ARICEPT) 5 MG tablet Take 2.5 mg by mouth at bedtime.     furosemide (LASIX) 20 MG tablet Take 0.5 tablets (10 mg total) by mouth daily. 15 tablet 0   ipratropium-albuterol (DUONEB) 0.5-2.5 (3) MG/3ML SOLN Take 3 mLs by nebulization daily. And Q6H PRN     lactulose (CHRONULAC) 10 GM/15ML solution Take by mouth daily. Give 15 mL by mouth     ondansetron (ZOFRAN) 8 MG tablet Take 8 mg by mouth every 8 (eight) hours as needed for nausea or vomiting.     OXYGEN Inhale 2 L/min into the lungs continuous.      Propylene Glycol (SYSTANE COMPLETE OP) Place 1 drop into both eyes 3 (three) times daily.     senna (SENOKOT) 8.6 MG TABS tablet Take 1 tablet by mouth in the morning and at bedtime.     zinc oxide 20 % ointment Apply 1 application topically as needed for irritation.     metoCLOPramide (REGLAN) 5 MG tablet Take 5 mg by mouth 2 (two) times daily.     No current facility-administered medications for this visit.    LABS/IMAGING: No results found for this or any previous visit (from the past 48 hour(s)). No results found.  VITALS: BP 116/62    Pulse 87    Wt 178 lb 3.2 oz (80.8 kg) Comment: on 06/19/20 per nursing home records   SpO2 100%    BMI 34.80 kg/m  EXAM: General appearance: alert,  mild distress, morbidly obese and shallow breathing Neck: no carotid bruit and no JVD Lungs: diminished breath sounds bilaterally Heart: irregularly irregular rhythm Abdomen: soft, non-tender; bowel sounds normal; no masses,  no organomegaly Extremities: Right lower extremity edema Pulses: 2+ and symmetric Skin: Skin color, texture, turgor normal. No rashes or lesions Neurologic: Grossly normal Psych: Flat affect  EKG: A. fib with low voltage QRS at 87 -personally reviewed  ASSESSMENT: 1. Chronic diastolic congestive heart failure, NYHA Class II-III symptoms 2. Persistent atrial fibrillation -on low-dose aspirin 3. Hypertension 4. Diabetes type 2 - on insulin 5. Morbid obesity 6. Obstructive sleep apnea 7. Sarcoidosis 8. Pulmonary hypertension on chronic 2 L nasal cannula oxygen 9. Dyslipidemia 10. Wheelchair-bound 11. 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   Mrs. Keshishyan continues to be a long-term resident [employer] with memory care.  Her dementia may have progressed.  She has probably NYHA class II-III symptoms without any lower extremity edema.  Her A. fib is rate controlled.  Blood pressure is at goal.  Weight has been fairly stable.  No medicine changes today.  Plan follow-up annually or sooner as necessary.  Pixie Casino, MD, [facility], [place] Director of the Advanced Lipid Disorders &  Cardiovascular Risk Reduction Clinic Diplomate of the American Board of Clinical Lipidology Attending Cardiologist  Direct Dial: [phone]   Fax: [phone]  Website:  [email or web address] 06/23/2020, 4:59 PM

## 2020-06-23 NOTE — Patient Instructions (Signed)

## 2020-09-28 ENCOUNTER — Non-Acute Institutional Stay: Payer: Medicare Other | Admitting: Internal Medicine

## 2020-09-28 DIAGNOSIS — G309 Alzheimer's disease, unspecified: Secondary | ICD-10-CM

## 2020-09-28 DIAGNOSIS — Z515 Encounter for palliative care: Secondary | ICD-10-CM

## 2020-09-28 DIAGNOSIS — F0280 Dementia in other diseases classified elsewhere without behavioral disturbance: Secondary | ICD-10-CM

## 2020-09-28 NOTE — Progress Notes (Signed)
[facility] Consult Note Telephone: [phone]  Fax: [phone]  PATIENT NAME: [name] DOB: [birth year] MRN: [id number]  PRIMARY CARE PROVIDER:  Dr. Jules Husbands  REFERRING PROVIDER:  Dr. Jules Husbands  [facility])  RESPONSIBLE PARTY:    Strozier,[name] Son [phone]  [phone]        RECOMMENDATIONS and PLAN:   Palliative care encounter  Z51.5  1.  Advanced care planning:  Advanced directives are in place with delegations of DNAR, no intubation, determine need for antibiotics, trial IV fluids, trial tube feedings. Contact son prior to any plans for transfer to the hospital. Pt was discharged from Hospice care on 08/29/2020 with a prognosis of > 6 months.  Plan on discussion with son to review current goals of care.  Palliative care will follow-up with patient.   2.  Dementia without behavioral disturbances:  Stable with FAST stage of 7c.  Continue to provide total care and cueing as needed. Monitor for changes in cognitive and functional abilities.   3.  ITP:  No active bleeding:  Monitor for signs of recurrent hemorrhaging.   I spent 30 minutes providing this consultation,  from 1430 to 1500. More than 50% of the time in this consultation was spent coordinating communication with patient, clinical staff and medical record review.   HISTORY OF PRESENT ILLNESS:   I am here to evaluate [name] to re-evaluate the goals of care and advanced care planning post discharge from Hospice care.  Clinical staff reports no recent illnesses and she has been able to tolerated being out of bed in her wheelchair with assistance. Palliative Care was asked to help address goals of care.   CODE STATUS: DNAR/DNI  PPS: 30% HOSPICE ELIGIBILITY/DIAGNOSIS: TBD  PAST MEDICAL HISTORY:  Past Medical History:  Diagnosis Date   Anemia    Asthma    Atrial fibrillation, persistent ([place]) 11/01/2013   Breast cancer, stage 1 ([place])  10/17/2011   s/p right lumpectomy and XRT   Bronchitis    Bruises easily    Chills    Chronic anticoagulation, on Eliquis 11/01/2013   Chronic bronchitis (HCC)    Chronic diastolic CHF (congestive heart failure) (HCC)    Constipation    COPD (chronic obstructive pulmonary disease) (HCC)    Cough    Dementia without behavioral disturbance ([place])    Dyslipidemia    Fracture of left lower leg 06/1972   "no surgery; just casted it"   Generalized weakness    Gout attack    ankle, then wrist and hands   Gout without tophus    Hernia    History of nuclear stress test 02/09/2010   dipyridamole; normal pattern of perfusion in all regions with attenuation artifact in inferior region, low risk    Hypertension    Insomnia    Morbid obesity ([place])    On home oxygen therapy    "2L prn" (01/24/2014)   OSA on CPAP    Pneumonia 2012; 2014   Protein calorie malnutrition ([place])    Sarcoid    Sarcoidosis    Type II diabetes mellitus ([place])    Uterine cancer ([place])    s/p hysterectomy   Wheezing     SOCIAL HX: Long term resident at SNF  ALLERGIES:  Allergies  Allergen Reactions   Contrast Media [Iodinated Diagnostic Agents] Nausea And Vomiting   Pravachol Other (See Comments)    Muscle pain     PERTINENT MEDICATIONS:  Outpatient Encounter  Medications as of 09/28/2020  Medication Sig   acetaminophen (TYLENOL) 650 MG suppository Place 650 mg rectally every 4 (four) hours as needed.   albuterol (PROVENTIL) (2.5 MG/3ML) 0.083% nebulizer solution Take by nebulization every 6 (six) hours as needed for wheezing or shortness of breath.   Amino Acids-Protein Hydrolys (PRO-STAT PO) Take 30 mLs by mouth in the morning and at bedtime.   aspirin EC 81 MG tablet Take 81 mg by mouth daily.   bisacodyl (DULCOLAX) 10 MG suppository Place 10 mg rectally daily as needed for moderate constipation.   budesonide (PULMICORT) 0.5 MG/2ML nebulizer solution Take 0.5 mg by  nebulization 2 (two) times daily.   calcium-vitamin D (OSCAL WITH D) 500-200 MG-UNIT tablet Take 1 tablet by mouth daily with breakfast.   Dextran 70-Hypromellose, PF, 0.1-0.3 % SOLN Apply 2 drops to eye in the morning, at noon, and at bedtime.   docusate sodium (COLACE) 100 MG capsule Take 100 mg by mouth daily as needed for mild constipation.   donepezil (ARICEPT) 5 MG tablet Take 2.5 mg by mouth at bedtime.   furosemide (LASIX) 20 MG tablet Take 0.5 tablets (10 mg total) by mouth daily.   ipratropium-albuterol (DUONEB) 0.5-2.5 (3) MG/3ML SOLN Take 3 mLs by nebulization daily. And Q6H PRN   lactulose (CHRONULAC) 10 GM/15ML solution Take by mouth daily. Give 15 mL by mouth   metoCLOPramide (REGLAN) 5 MG tablet Take 5 mg by mouth 2 (two) times daily.   ondansetron (ZOFRAN) 8 MG tablet Take 8 mg by mouth every 8 (eight) hours as needed for nausea or vomiting.   OXYGEN Inhale 2 L/min into the lungs continuous.    Propylene Glycol (SYSTANE COMPLETE OP) Place 1 drop into both eyes 3 (three) times daily.   senna (SENOKOT) 8.6 MG TABS tablet Take 1 tablet by mouth in the morning and at bedtime.   zinc oxide 20 % ointment Apply 1 application topically as needed for irritation.   No facility-administered encounter medications on file as of 09/28/2020.    PHYSICAL EXAM:   General: NAD, chronically ill appearing elderly female reclined in bed Cardiovascular: regular rate and rhythm Pulmonary: clear ant fields, no increased respiratory effort.  O2 via [place] in use Abdomen: soft, nontender, + bowel sounds Skin: scattered purpura on BUE, exposed skin is intact Neurological: Somnolent  Gonzella Lex, NP-C

## 2020-09-29 ENCOUNTER — Other Ambulatory Visit: Payer: Self-pay

## 2020-11-30 ENCOUNTER — Telehealth: Payer: Self-pay

## 2020-11-30 NOTE — Telephone Encounter (Signed)
Message received that son had called concerned that patient has demonstrated decline. Patient is a patient at [facility]. Palliative care NP updated.

## 2020-12-01 ENCOUNTER — Telehealth: Payer: Self-pay | Admitting: Internal Medicine

## 2020-12-01 NOTE — Telephone Encounter (Signed)
Spoke to pt's son, [name] (ok per DPR), inquiring about events that have happened in the last 24 hours pertaining to his mom ([name]).  [name] explains that he and his brother were called to their mom's facility yesterday after being told that she wasn't doing so well. [name] explains that the provider on site told them that a chest x-ray was obtained and told him that she didn't have pneumonia but that there was "moisture" on the lungs and they attributed this to her congestive heart failure. The facility then treated the pt with lasix and a breathing treatment with marked improvement. [name] is concerned about the information the provider at the facility is correct or not.  Ulanda Edison that it is possible to see some fluid on the lungs via xray and it seems that the provider may have made decision to treat heart failure due to other parts of their physical assessment. Encouraged pt's son to ask provider any questions that he may have while they are treating his mother. [name] expresses that he is the power of attorney and that he is making all the decisions and that this is a hard time.  [name] tells me that yesterday the put the pt on palliative care and that he knows that the time is eventually going to come but he wants to make sure that he is making the best possible decisions that he can for his mom.  Instructed [name] that if he needs [place] for any questions that he is welcome to call.  Pt's son verbalizes understanding.

## 2020-12-01 NOTE — Telephone Encounter (Signed)
New Message:      Pt's son called. He said pt had an xray a few days ago at the facility where she stays.. His question is, can an xray show Congested Heart Failure?Marland Kitchen

## 2020-12-01 NOTE — Telephone Encounter (Signed)
Thanks for letting me know - I agree with your advice. Sounds like they treated her appropriately.  Dr Debara Pickett

## 2021-08-27 ENCOUNTER — Other Ambulatory Visit: Payer: Self-pay | Admitting: Radiology

## 2021-09-01 ENCOUNTER — Non-Acute Institutional Stay: Payer: Medicare Other | Admitting: Student

## 2021-09-01 ENCOUNTER — Other Ambulatory Visit: Payer: Self-pay

## 2021-09-01 DIAGNOSIS — F0280 Dementia in other diseases classified elsewhere without behavioral disturbance: Secondary | ICD-10-CM

## 2021-09-01 DIAGNOSIS — Z515 Encounter for palliative care: Secondary | ICD-10-CM

## 2021-09-01 DIAGNOSIS — R0602 Shortness of breath: Secondary | ICD-10-CM

## 2021-09-01 NOTE — Progress Notes (Signed)
[facility] Consult Note Telephone: [phone]  Fax: [phone]    Date of encounter: 09/01/21  PATIENT NAME: [name] [id number]   [phone] (home)  DOB: 1941-10-16 MRN: [id number] PRIMARY CARE PROVIDER:    Dr. Clovis Riley PROVIDER:   Dr. Keenan Bachelor  RESPONSIBLE PARTY:    Contact Information     Name Relation Home Work Mobile   Laskowski,[name] Son [phone]  [phone]   Voigt,Anthony Son [phone]  [phone]        I met face to face with patient in the facility. Palliative Care was asked to follow this patient by consultation request of  Dr. Keenan Bachelor to address advance care planning and complex medical decision making. This is a follow up visit.                                   ASSESSMENT AND PLAN / RECOMMENDATIONS:   Advance Care Planning/Goals of Care: Goals include to maximize quality of life and symptom management.   CODE STATUS: DNR  Symptom Management/Plan:  Dementia without behavioral disturbances-patient is dependent for adl's. Continue to reorient/redirect as needed. Monitor for falls/safety. Monitor for further functional and cognitive declines.   Shortness of breath-secondary to COPD, heart failure. Continue oxygen as directed.   Follow up Palliative Care Visit: Palliative care will continue to follow for complex medical decision making, advance care planning, and clarification of goals. Return in 8 weeks or prn.  I spent 25 minutes providing this consultation. More than 50% of the time in this consultation was spent in counseling and care coordination.   PPS: 30%  HOSPICE ELIGIBILITY/DIAGNOSIS: TBD  Chief Complaint: Palliative Medicine follow up visit.   HISTORY OF PRESENT ILLNESS:  [name] is a 80 y.o. year old female  with dementia, COPD, chronic diastolic CHF, atrial fibrillation, constipation, hyperlipidemia, hypertension, type 2 diabetes, hx of  breast cancer, s/p right lumpectomy and XRT 2012. Patient had previously been on hospice; discharged 08/2020 due to stability.   Patient has been stable per nursing staff. [facility] NP does state patient has had a breast biopsy recently, although no plans to proceed further. Patient denies pain, she does endorse shortness of breath. Patient has been eating well. She is receiving a mechanical soft diet. She is out of bed daily to geri chair. No recent ED visits or hospitalizations reported. Patient able to answer direct questions. HPI and ROS primarily obtained from staff due to her dementia.   History obtained from review of EMR, discussion with primary team, and interview with family, facility staff/caregiver and/or [name].  I reviewed available labs, medications, imaging, studies and related documents from the EMR.  Records reviewed and summarized above.    Physical Exam: Constitutional: NAD General: frail appearing, EYES: anicteric sclera, lids intact, no discharge  ENMT: intact hearing, oral mucous membranes moist, dentition intact CV: S1S2, RRR, no LE edema Pulmonary: LCTA, no increased work of breathing, no cough, oxygen at 2 lpm via n/c Abdomen: normo-active BS + 4 quadrants, soft and non tender GU: deferred MSK: non-ambulatory Skin: warm and dry, no rashes or wounds on visible skin Neuro: generalized weakness, alert and oriented Psych: non-anxious affect, pleasant Hem/lymph/immuno: no widespread bruising   Thank you for the opportunity to participate in the care of [name].  The palliative care team will continue to follow. Please call our office  at [phone] if we can be of additional assistance.   Ezekiel Slocumb, NP   COVID-19 PATIENT SCREENING TOOL Asked and negative response unless otherwise noted:   Have you had symptoms of covid, tested positive or been in contact with someone with symptoms/positive test in the past 5-10 days? No

## 2021-10-16 ENCOUNTER — Encounter (HOSPITAL_COMMUNITY): Payer: Self-pay

## 2021-10-16 ENCOUNTER — Other Ambulatory Visit: Payer: Self-pay

## 2021-10-16 ENCOUNTER — Inpatient Hospital Stay (HOSPITAL_COMMUNITY)
Admission: EM | Admit: 2021-10-16 | Discharge: 2021-10-21 | DRG: 871 | Disposition: A | Discharge status: Skilled Nursing Facility | Payer: Medicare Other | Source: Skilled Nursing Facility | Attending: Internal Medicine | Admitting: Internal Medicine

## 2021-10-16 ENCOUNTER — Emergency Department (HOSPITAL_COMMUNITY): Payer: Medicare Other

## 2021-10-16 DIAGNOSIS — I130 Hypertensive heart and chronic kidney disease with heart failure and stage 1 through stage 4 chronic kidney disease, or unspecified chronic kidney disease: Secondary | ICD-10-CM | POA: Diagnosis present

## 2021-10-16 DIAGNOSIS — I482 Chronic atrial fibrillation, unspecified: Secondary | ICD-10-CM

## 2021-10-16 DIAGNOSIS — G9341 Metabolic encephalopathy: Secondary | ICD-10-CM | POA: Diagnosis present

## 2021-10-16 DIAGNOSIS — Z66 Do not resuscitate: Secondary | ICD-10-CM | POA: Diagnosis present

## 2021-10-16 DIAGNOSIS — F0390 Unspecified dementia without behavioral disturbance: Secondary | ICD-10-CM | POA: Diagnosis present

## 2021-10-16 DIAGNOSIS — D869 Sarcoidosis, unspecified: Secondary | ICD-10-CM | POA: Diagnosis present

## 2021-10-16 DIAGNOSIS — Z6838 Body mass index (BMI) 38.0-38.9, adult: Secondary | ICD-10-CM

## 2021-10-16 DIAGNOSIS — R6520 Severe sepsis without septic shock: Secondary | ICD-10-CM | POA: Diagnosis present

## 2021-10-16 DIAGNOSIS — Z8049 Family history of malignant neoplasm of other genital organs: Secondary | ICD-10-CM

## 2021-10-16 DIAGNOSIS — Z801 Family history of malignant neoplasm of trachea, bronchus and lung: Secondary | ICD-10-CM

## 2021-10-16 DIAGNOSIS — I4819 Other persistent atrial fibrillation: Secondary | ICD-10-CM | POA: Diagnosis present

## 2021-10-16 DIAGNOSIS — Z862 Personal history of diseases of the blood and blood-forming organs and certain disorders involving the immune mechanism: Secondary | ICD-10-CM

## 2021-10-16 DIAGNOSIS — Z7951 Long term (current) use of inhaled steroids: Secondary | ICD-10-CM

## 2021-10-16 DIAGNOSIS — Z800 Family history of malignant neoplasm of digestive organs: Secondary | ICD-10-CM | POA: Diagnosis not present

## 2021-10-16 DIAGNOSIS — J441 Chronic obstructive pulmonary disease with (acute) exacerbation: Secondary | ICD-10-CM | POA: Diagnosis present

## 2021-10-16 DIAGNOSIS — Z853 Personal history of malignant neoplasm of breast: Secondary | ICD-10-CM

## 2021-10-16 DIAGNOSIS — M109 Gout, unspecified: Secondary | ICD-10-CM | POA: Diagnosis present

## 2021-10-16 DIAGNOSIS — J9621 Acute and chronic respiratory failure with hypoxia: Secondary | ICD-10-CM | POA: Diagnosis present

## 2021-10-16 DIAGNOSIS — G4733 Obstructive sleep apnea (adult) (pediatric): Secondary | ICD-10-CM | POA: Diagnosis present

## 2021-10-16 DIAGNOSIS — Z833 Family history of diabetes mellitus: Secondary | ICD-10-CM

## 2021-10-16 DIAGNOSIS — T17908A Unspecified foreign body in respiratory tract, part unspecified causing other injury, initial encounter: Secondary | ICD-10-CM

## 2021-10-16 DIAGNOSIS — E1165 Type 2 diabetes mellitus with hyperglycemia: Secondary | ICD-10-CM | POA: Diagnosis present

## 2021-10-16 DIAGNOSIS — Z20822 Contact with and (suspected) exposure to covid-19: Secondary | ICD-10-CM | POA: Diagnosis present

## 2021-10-16 DIAGNOSIS — N182 Chronic kidney disease, stage 2 (mild): Secondary | ICD-10-CM | POA: Diagnosis present

## 2021-10-16 DIAGNOSIS — Z96653 Presence of artificial knee joint, bilateral: Secondary | ICD-10-CM | POA: Diagnosis present

## 2021-10-16 DIAGNOSIS — J9622 Acute and chronic respiratory failure with hypercapnia: Secondary | ICD-10-CM

## 2021-10-16 DIAGNOSIS — Z7982 Long term (current) use of aspirin: Secondary | ICD-10-CM

## 2021-10-16 DIAGNOSIS — Z8701 Personal history of pneumonia (recurrent): Secondary | ICD-10-CM

## 2021-10-16 DIAGNOSIS — Z91041 Radiographic dye allergy status: Secondary | ICD-10-CM

## 2021-10-16 DIAGNOSIS — I5032 Chronic diastolic (congestive) heart failure: Secondary | ICD-10-CM | POA: Diagnosis present

## 2021-10-16 DIAGNOSIS — Z9981 Dependence on supplemental oxygen: Secondary | ICD-10-CM

## 2021-10-16 DIAGNOSIS — E669 Obesity, unspecified: Secondary | ICD-10-CM | POA: Diagnosis present

## 2021-10-16 DIAGNOSIS — Z794 Long term (current) use of insulin: Secondary | ICD-10-CM | POA: Diagnosis not present

## 2021-10-16 DIAGNOSIS — Z888 Allergy status to other drugs, medicaments and biological substances status: Secondary | ICD-10-CM

## 2021-10-16 DIAGNOSIS — J189 Pneumonia, unspecified organism: Secondary | ICD-10-CM

## 2021-10-16 DIAGNOSIS — Z7189 Other specified counseling: Secondary | ICD-10-CM | POA: Diagnosis not present

## 2021-10-16 DIAGNOSIS — R4182 Altered mental status, unspecified: Secondary | ICD-10-CM | POA: Diagnosis present

## 2021-10-16 DIAGNOSIS — J690 Pneumonitis due to inhalation of food and vomit: Secondary | ICD-10-CM | POA: Diagnosis present

## 2021-10-16 DIAGNOSIS — A419 Sepsis, unspecified organism: Principal | ICD-10-CM | POA: Diagnosis present

## 2021-10-16 DIAGNOSIS — E785 Hyperlipidemia, unspecified: Secondary | ICD-10-CM | POA: Diagnosis present

## 2021-10-16 DIAGNOSIS — Z79899 Other long term (current) drug therapy: Secondary | ICD-10-CM

## 2021-10-16 DIAGNOSIS — E1122 Type 2 diabetes mellitus with diabetic chronic kidney disease: Secondary | ICD-10-CM | POA: Diagnosis present

## 2021-10-16 DIAGNOSIS — M810 Age-related osteoporosis without current pathological fracture: Secondary | ICD-10-CM | POA: Diagnosis present

## 2021-10-16 DIAGNOSIS — Z8542 Personal history of malignant neoplasm of other parts of uterus: Secondary | ICD-10-CM

## 2021-10-16 HISTORY — DX: Immune thrombocytopenic purpura: D69.3

## 2021-10-16 LAB — COMPREHENSIVE METABOLIC PANEL
ALT: 10 U/L (ref 0–44)
AST: 14 U/L — ABNORMAL LOW (ref 15–41)
Albumin: 3 g/dL — ABNORMAL LOW (ref 3.5–5.0)
Alkaline Phosphatase: 82 U/L (ref 38–126)
Anion gap: 7 (ref 5–15)
BUN: 34 mg/dL — ABNORMAL HIGH (ref 8–23)
CO2: 33 mmol/L — ABNORMAL HIGH (ref 22–32)
Calcium: 8.3 mg/dL — ABNORMAL LOW (ref 8.9–10.3)
Chloride: 98 mmol/L (ref 98–111)
Creatinine, Ser: 0.79 mg/dL (ref 0.44–1.00)
GFR, Estimated: >60 mL/min (ref >60)
Glucose, Bld: 179 mg/dL — ABNORMAL HIGH (ref 70–99)
Potassium: 3.5 mmol/L (ref 3.5–5.1)
Sodium: 138 mmol/L (ref 135–145)
Total Bilirubin: 1 mg/dL (ref 0.3–1.2)
Total Protein: 6.7 g/dL (ref 6.5–8.1)

## 2021-10-16 LAB — CBC WITH DIFFERENTIAL/PLATELET
Abs Immature Granulocytes: 0.12 10*3/uL — ABNORMAL HIGH (ref 0.00–0.07)
Basophils Absolute: 0 10*3/uL (ref 0.0–0.1)
Basophils Relative: 0 %
Eosinophils Absolute: 0.1 10*3/uL (ref 0.0–0.5)
Eosinophils Relative: 0 %
HCT: 36.6 % (ref 36.0–46.0)
Hemoglobin: 11.2 g/dL — ABNORMAL LOW (ref 12.0–15.0)
Immature Granulocytes: 1 %
Lymphocytes Relative: 6 %
Lymphs Abs: 1.2 10*3/uL (ref 0.7–4.0)
MCH: 25.1 pg — ABNORMAL LOW (ref 26.0–34.0)
MCHC: 30.6 g/dL (ref 30.0–36.0)
MCV: 82.1 fL (ref 80.0–100.0)
Monocytes Absolute: 1.5 10*3/uL — ABNORMAL HIGH (ref 0.1–1.0)
Monocytes Relative: 8 %
Neutro Abs: 16.5 10*3/uL — ABNORMAL HIGH (ref 1.7–7.7)
Neutrophils Relative %: 85 %
Platelets: 310 10*3/uL (ref 150–400)
RBC: 4.46 MIL/uL (ref 3.87–5.11)
RDW: 16.1 % — ABNORMAL HIGH (ref 11.5–15.5)
WBC: 19.4 10*3/uL — ABNORMAL HIGH (ref 4.0–10.5)
nRBC: 0 % (ref 0.0–0.2)

## 2021-10-16 LAB — BLOOD GAS, VENOUS
Acid-Base Excess: 4.7 mmol/L — ABNORMAL HIGH (ref 0.0–2.0)
Bicarbonate: 33.6 mmol/L — ABNORMAL HIGH (ref 20.0–28.0)
O2 Saturation: 96.9 %
Patient temperature: 98.6
pCO2, Ven: 80.3 mmHg (ref 44.0–60.0)
pH, Ven: 7.245 — ABNORMAL LOW (ref 7.250–7.430)
pO2, Ven: 95 mmHg — ABNORMAL HIGH (ref 32.0–45.0)

## 2021-10-16 LAB — BLOOD GAS, ARTERIAL
Acid-Base Excess: 6.3 mmol/L — ABNORMAL HIGH (ref 0.0–2.0)
Bicarbonate: 33.2 mmol/L — ABNORMAL HIGH (ref 20.0–28.0)
Delivery systems: POSITIVE
Drawn by: 25770
FIO2: 40
Mode: POSITIVE
O2 Saturation: 98.2 %
Patient temperature: 98.9
pCO2 arterial: 64.9 mmHg — ABNORMAL HIGH (ref 32.0–48.0)
pH, Arterial: 7.33 — ABNORMAL LOW (ref 7.350–7.450)
pO2, Arterial: 103 mmHg (ref 83.0–108.0)

## 2021-10-16 LAB — URINALYSIS, ROUTINE W REFLEX MICROSCOPIC
Bilirubin Urine: NEGATIVE
Glucose, UA: NEGATIVE mg/dL
Hgb urine dipstick: NEGATIVE
Ketones, ur: NEGATIVE mg/dL
Leukocytes,Ua: NEGATIVE
Nitrite: NEGATIVE
Protein, ur: NEGATIVE mg/dL
Specific Gravity, Urine: 1.016 (ref 1.005–1.030)
pH: 5 (ref 5.0–8.0)

## 2021-10-16 LAB — RESP PANEL BY RT-PCR (FLU A&B, COVID) ARPGX2
Influenza A by PCR: NEGATIVE
Influenza B by PCR: NEGATIVE
SARS Coronavirus 2 by RT PCR: NEGATIVE

## 2021-10-16 LAB — CBG MONITORING, ED
Glucose-Capillary: 154 mg/dL — ABNORMAL HIGH (ref 70–99)
Glucose-Capillary: 137 mg/dL — ABNORMAL HIGH (ref 70–99)
Glucose-Capillary: 156 mg/dL — ABNORMAL HIGH (ref 70–99)

## 2021-10-16 LAB — APTT: aPTT: 32 seconds (ref 24–36)

## 2021-10-16 LAB — MAGNESIUM: Magnesium: 1.8 mg/dL (ref 1.7–2.4)

## 2021-10-16 LAB — LACTIC ACID, PLASMA
Lactic Acid, Venous: 0.9 mmol/L (ref 0.5–1.9)
Lactic Acid, Venous: 0.9 mmol/L (ref 0.5–1.9)

## 2021-10-16 LAB — GLUCOSE, CAPILLARY: Glucose-Capillary: 132 mg/dL — ABNORMAL HIGH (ref 70–99)

## 2021-10-16 LAB — PROTIME-INR
INR: 1.2 (ref 0.8–1.2)
Prothrombin Time: 15.4 seconds — ABNORMAL HIGH (ref 11.4–15.2)

## 2021-10-16 MED ORDER — ONDANSETRON HCL 4 MG PO TABS: 4.0000 mg | ORAL_TABLET | Freq: Four times a day (QID) | ORAL | Status: DC | PRN

## 2021-10-16 MED ORDER — ONDANSETRON HCL 4 MG/2ML IJ SOLN: 4.0000 mg | Freq: Four times a day (QID) | INTRAMUSCULAR | Status: DC | PRN

## 2021-10-16 MED ORDER — METOCLOPRAMIDE HCL 5 MG PO TABS
5.0000 mg | ORAL_TABLET | Freq: Two times a day (BID) | ORAL | Status: DC
  Administered 2021-10-17 – 2021-10-21 (×10): 5 mg via ORAL
  Filled 2021-10-16 (×10): qty 1

## 2021-10-16 MED ORDER — IPRATROPIUM-ALBUTEROL 0.5-2.5 (3) MG/3ML IN SOLN
3.0000 mL | Freq: Four times a day (QID) | RESPIRATORY_TRACT | Status: DC
  Administered 2021-10-16 (×2): 3 mL via RESPIRATORY_TRACT
  Filled 2021-10-16 (×2): qty 3

## 2021-10-16 MED ORDER — IPRATROPIUM-ALBUTEROL 0.5-2.5 (3) MG/3ML IN SOLN: 3.0000 mL | Freq: Two times a day (BID) | RESPIRATORY_TRACT | Status: DC

## 2021-10-16 MED ORDER — ACETAMINOPHEN 325 MG PO TABS: 650.0000 mg | ORAL_TABLET | Freq: Four times a day (QID) | ORAL | Status: DC | PRN

## 2021-10-16 MED ORDER — LACTATED RINGERS IV BOLUS (SEPSIS)
1000.0000 mL | Freq: Once | INTRAVENOUS | Status: AC
  Administered 2021-10-16: 1000 mL via INTRAVENOUS

## 2021-10-16 MED ORDER — ENOXAPARIN SODIUM 40 MG/0.4ML IJ SOSY
40.0000 mg | PREFILLED_SYRINGE | INTRAMUSCULAR | Status: DC
  Administered 2021-10-16 – 2021-10-21 (×6): 40 mg via SUBCUTANEOUS
  Filled 2021-10-16 (×6): qty 0.4

## 2021-10-16 MED ORDER — SENNA 8.6 MG PO TABS
2.0000 | ORAL_TABLET | Freq: Every day | ORAL | Status: DC
  Administered 2021-10-17 – 2021-10-21 (×5): 17.2 mg via ORAL
  Filled 2021-10-16 (×5): qty 2

## 2021-10-16 MED ORDER — NYSTATIN 100000 UNIT/GM EX POWD
1.0000 "application " | Freq: Two times a day (BID) | CUTANEOUS | Status: DC
  Administered 2021-10-16 – 2021-10-21 (×11): 1 via TOPICAL
  Filled 2021-10-16 (×2): qty 15

## 2021-10-16 MED ORDER — SODIUM CHLORIDE 0.9 % IV SOLN
500.0000 mg | INTRAVENOUS | Status: DC
  Administered 2021-10-17 – 2021-10-18 (×2): 500 mg via INTRAVENOUS
  Filled 2021-10-16 (×2): qty 500

## 2021-10-16 MED ORDER — BUDESONIDE 0.5 MG/2ML IN SUSP
0.5000 mg | Freq: Two times a day (BID) | RESPIRATORY_TRACT | Status: DC
  Administered 2021-10-16 – 2021-10-21 (×12): 0.5 mg via RESPIRATORY_TRACT
  Filled 2021-10-16 (×12): qty 2

## 2021-10-16 MED ORDER — SODIUM CHLORIDE 0.9 % IV SOLN
500.0000 mg | Freq: Once | INTRAVENOUS | Status: AC
  Administered 2021-10-16: 500 mg via INTRAVENOUS
  Filled 2021-10-16: qty 500

## 2021-10-16 MED ORDER — SODIUM CHLORIDE 0.9 % IV SOLN
2.0000 g | INTRAVENOUS | Status: AC
  Administered 2021-10-17 – 2021-10-21 (×5): 2 g via INTRAVENOUS
  Filled 2021-10-16 (×5): qty 20

## 2021-10-16 MED ORDER — ACETAMINOPHEN 650 MG RE SUPP: 650.0000 mg | Freq: Four times a day (QID) | RECTAL | Status: DC | PRN

## 2021-10-16 MED ORDER — ASPIRIN EC 81 MG PO TBEC
81.0000 mg | DELAYED_RELEASE_TABLET | Freq: Every day | ORAL | Status: DC
  Administered 2021-10-17 – 2021-10-21 (×5): 81 mg via ORAL
  Filled 2021-10-16 (×6): qty 1

## 2021-10-16 MED ORDER — LACTULOSE 10 GM/15ML PO SOLN
10.0000 g | Freq: Every day | ORAL | Status: DC
  Administered 2021-10-17 – 2021-10-21 (×5): 10 g via ORAL
  Filled 2021-10-16 (×6): qty 30

## 2021-10-16 MED ORDER — POTASSIUM CHLORIDE 2 MEQ/ML IV SOLN
INTRAVENOUS | Status: AC
  Filled 2021-10-16 (×2): qty 1000

## 2021-10-16 MED ORDER — SODIUM CHLORIDE 0.9 % IV SOLN
2.0000 g | Freq: Once | INTRAVENOUS | Status: AC
  Administered 2021-10-16: 2 g via INTRAVENOUS
  Filled 2021-10-16: qty 20

## 2021-10-16 MED ORDER — DONEPEZIL HCL 5 MG PO TABS
2.5000 mg | ORAL_TABLET | Freq: Every day | ORAL | Status: DC
  Administered 2021-10-17 – 2021-10-21 (×5): 2.5 mg via ORAL
  Filled 2021-10-16 (×5): qty 1

## 2021-10-16 MED ORDER — PROPYLENE GLYCOL 0.6 % OP SOLN: Freq: Three times a day (TID) | OPHTHALMIC | Status: DC

## 2021-10-16 MED ORDER — IPRATROPIUM-ALBUTEROL 0.5-2.5 (3) MG/3ML IN SOLN: 3.0000 mL | RESPIRATORY_TRACT | Status: DC | PRN

## 2021-10-16 MED ORDER — POLYVINYL ALCOHOL 1.4 % OP SOLN
2.0000 [drp] | Freq: Three times a day (TID) | OPHTHALMIC | Status: DC
  Administered 2021-10-16 – 2021-10-21 (×17): 2 [drp] via OPHTHALMIC
  Filled 2021-10-16 (×2): qty 15

## 2021-10-16 MED ORDER — IPRATROPIUM-ALBUTEROL 0.5-2.5 (3) MG/3ML IN SOLN
3.0000 mL | Freq: Two times a day (BID) | RESPIRATORY_TRACT | Status: DC
  Administered 2021-10-16 – 2021-10-21 (×11): 3 mL via RESPIRATORY_TRACT
  Filled 2021-10-16 (×11): qty 3

## 2021-10-16 MED ORDER — POLYETHYLENE GLYCOL 3350 17 G PO PACK: 17.0000 g | PACK | Freq: Every day | ORAL | Status: DC | PRN

## 2021-10-16 MED ORDER — METHYLPREDNISOLONE SODIUM SUCC 40 MG IJ SOLR
40.0000 mg | Freq: Two times a day (BID) | INTRAMUSCULAR | Status: DC
Start: 2021-10-16 — End: 2021-10-18
  Administered 2021-10-16 – 2021-10-18 (×5): 40 mg via INTRAVENOUS
  Filled 2021-10-16 (×5): qty 1

## 2021-10-16 MED ORDER — INSULIN ASPART 100 UNIT/ML IJ SOLN
0.0000 [IU] | Freq: Three times a day (TID) | INTRAMUSCULAR | Status: DC
  Administered 2021-10-16: 3 [IU] via SUBCUTANEOUS
  Administered 2021-10-16: 2 [IU] via SUBCUTANEOUS
  Administered 2021-10-16: 3 [IU] via SUBCUTANEOUS
  Administered 2021-10-16: 2 [IU] via SUBCUTANEOUS
  Administered 2021-10-17: 3 [IU] via SUBCUTANEOUS
  Administered 2021-10-17: 5 [IU] via SUBCUTANEOUS
  Administered 2021-10-17: 8 [IU] via SUBCUTANEOUS
  Administered 2021-10-17: 3 [IU] via SUBCUTANEOUS
  Administered 2021-10-18: 5 [IU] via SUBCUTANEOUS
  Administered 2021-10-18: 8 [IU] via SUBCUTANEOUS
  Administered 2021-10-18: 5 [IU] via SUBCUTANEOUS
  Administered 2021-10-18: 3 [IU] via SUBCUTANEOUS
  Administered 2021-10-19: 8 [IU] via SUBCUTANEOUS
  Administered 2021-10-19: 2 [IU] via SUBCUTANEOUS
  Administered 2021-10-19 – 2021-10-20 (×3): 3 [IU] via SUBCUTANEOUS
  Administered 2021-10-20: 8 [IU] via SUBCUTANEOUS
  Administered 2021-10-20 (×2): 5 [IU] via SUBCUTANEOUS
  Administered 2021-10-21: 11 [IU] via SUBCUTANEOUS
  Administered 2021-10-21: 8 [IU] via SUBCUTANEOUS
  Administered 2021-10-21: 3 [IU] via SUBCUTANEOUS
  Administered 2021-10-21: 15 [IU] via SUBCUTANEOUS
  Filled 2021-10-16: qty 0.15

## 2021-10-16 MED ORDER — CALCIUM CARBONATE ANTACID 500 MG PO CHEW
1500.0000 mg | CHEWABLE_TABLET | Freq: Every day | ORAL | Status: DC
  Administered 2021-10-18 – 2021-10-21 (×3): 1500 mg via ORAL
  Filled 2021-10-16 (×2): qty 8

## 2021-10-16 NOTE — Progress Notes (Signed)
Pt appears to be more alert per son at bedside.  BIPAP removed and pt placed on 4 LPM [place].  Pt currently tolerating well at this time.

## 2021-10-16 NOTE — ED Provider Notes (Signed)
[facility] DEPT Provider Note   CSN: [id number] Arrival date & time: 10/16/21  0009     History Chief Complaint  Patient presents with   Altered Mental Status   Cough    Zayda PARUL PORCELLI is a 80 y.o. female.  80 yo F with a chief complaints of shortness of breath.  This was noticed by her nursing home.  The patient is nonverbal at baseline level 5 caveat.   Altered Mental Status Cough     Past Medical History:  Diagnosis Date   Anemia    Asthma    Atrial fibrillation, persistent ([place]) 11/01/2013   Breast cancer, stage 1 ([place]) 10/17/2011   s/p right lumpectomy and XRT   Bronchitis    Bruises easily    Chills    Chronic anticoagulation, on Eliquis 11/01/2013   Chronic bronchitis (HCC)    Chronic diastolic CHF (congestive heart failure) (HCC)    Constipation    COPD (chronic obstructive pulmonary disease) (HCC)    Cough    Dementia without behavioral disturbance ([place])    Dyslipidemia    Fracture of left lower leg 06/1972   "no surgery; just casted it"   Generalized weakness    Gout attack    ankle, then wrist and hands   Gout without tophus    Hernia    History of nuclear stress test 02/09/2010   dipyridamole; normal pattern of perfusion in all regions with attenuation artifact in inferior region, low risk    Hypertension    Insomnia    Morbid obesity ([place])    On home oxygen therapy    "2L prn" (01/24/2014)   OSA on CPAP    Pneumonia 2012; 2014   Protein calorie malnutrition ([place])    Sarcoid    Sarcoidosis    Type II diabetes mellitus ([place])    Uterine cancer ([place])    s/p hysterectomy   Wheezing     Patient Active Problem List   Diagnosis Date Noted   Acute respiratory failure with hypoxia ([place]) 10/16/2021   Contusion of trunk 05/04/2018   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   Memory loss 04/20/2018   Acute ITP ([place])    Palliative care by specialist    Advanced care planning/counseling discussion     Diverticular hemorrhage 03/18/2018   Thrombocytopenia ([place]) 03/18/2018   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   CKD (chronic kidney disease) stage 2, GFR 60-89 ml/min 07/12/2016   Hypertensive heart disease with heart failure ([place]) 05/17/2016   Diabetes mellitus type 2 in obese ([place]) 05/17/2016   Dementia without behavioral disturbance ([place]) 05/17/2016   Hypotension 10/05/2015   Osteoporosis 10/02/2014   Vertigo 04/19/2014   Pre-syncope 04/17/2014   Pulmonary hypertension- PA pressure 46 mmHg 01/29/2014   Hypokalemia 01/27/2014   Acute on chronic diastolic heart failure ([place]) 01/24/2014   Hypertension 11/27/2013   Constipation 11/27/2013   Chronic diastolic CHF (congestive heart failure) ([place]) 11/18/2013   Bradycardia 11/01/2013   Chronic atrial fibrillation ([place]) 11/01/2013   Chronic anticoagulation, on Eliquis 11/01/2013   COPD (chronic obstructive pulmonary disease) ([place]) 08/19/2013   Chronic respiratory failure with hypoxia ([place]) 08/19/2013   Weakness generalized 08/19/2013   Lung nodule seen on imaging study 08/18/2013   Nausea and vomiting 08/18/2013   Lower GI bleed 05/18/2013   Aspiration pneumonia ([place]) 05/12/2013   Hyperlipidemia 05/03/2013   COPD exacerbation ([place]) 11/20/2012   Gout 11/17/2012   Hypocalcemia 11/17/2012   Upper respiratory infection 12/17/2011  Breast cancer of lower-inner quadrant of right female breast ([place]) 10/17/2011   Obesity hypoventilation syndrome ([place]) 09/24/2011   Peripheral edema 09/24/2011   Anemia 06/01/2011   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   Bronchitis 05/27/2008   Obstructive sleep apnea 05/27/2008    Past Surgical History:  Procedure Laterality Date   ABDOMINAL HYSTERECTOMY  02/04/2005   BREAST BIOPSY Right 01/2011   BREAST LUMPECTOMY Right 01/2011   FLEXIBLE SIGMOIDOSCOPY N/A 04/16/2018   Procedure: FLEXIBLE SIGMOIDOSCOPY;  Surgeon: Otis Brace, MD;  Location: [facility];  Service: Gastroenterology;   Laterality: N/A;  No sedation    HERNIA REPAIR     TOTAL KNEE ARTHROPLASTY Bilateral 03/2010; 06/2011   left; right   TRANSTHORACIC ECHOCARDIOGRAM  07/2008   EF, LV size is normal; RVSP normal; mod calcif of MV apparatus   TUBAL LIGATION  5035'W   UMBILICAL HERNIA REPAIR  02/04/2005     OB History   No obstetric history on file.     Family History  Problem Relation Age of Onset   Diabetes Mother    Colon cancer Father    Diabetes Brother    Diabetes Sister    Lung cancer Brother    Cervical cancer Sister    Thrombocytopenia Neg Hx    Bleeding Disorder Neg Hx     Social History   Tobacco Use   Smoking status: Never   Smokeless tobacco: Never  Substance Use Topics   Alcohol use: No   Drug use: No    Home Medications Prior to Admission medications   Medication Sig Start Date End Date Taking? Authorizing Provider  acetaminophen (TYLENOL) 650 MG suppository Place 650 mg rectally every 4 (four) hours as needed.    [provider]  albuterol (PROVENTIL) (2.5 MG/3ML) 0.083% nebulizer solution Take by nebulization every 6 (six) hours as needed for wheezing or shortness of breath.    [provider]  Amino Acids-Protein Hydrolys (PRO-STAT PO) Take 30 mLs by mouth in the morning and at bedtime.    [provider]  aspirin EC 81 MG tablet Take 81 mg by mouth daily.    [provider]  bisacodyl (DULCOLAX) 10 MG suppository Place 10 mg rectally daily as needed for moderate constipation.    [provider]  budesonide (PULMICORT) 0.5 MG/2ML nebulizer solution Take 0.5 mg by nebulization 2 (two) times daily.    [provider]  calcium-vitamin D (OSCAL WITH D) 500-200 MG-UNIT tablet Take 1 tablet by mouth daily with breakfast.    [provider]  Dextran 70-Hypromellose, PF, 0.1-0.3 % SOLN Apply 2 drops to eye in the morning, at noon, and at bedtime.    [provider]  docusate sodium (COLACE) 100 MG capsule Take  100 mg by mouth daily as needed for mild constipation.    [provider]  donepezil (ARICEPT) 5 MG tablet Take 2.5 mg by mouth at bedtime.    [provider]  furosemide (LASIX) 20 MG tablet Take 0.5 tablets (10 mg total) by mouth daily. 04/17/18   Jani Gravel, MD  ipratropium-albuterol (DUONEB) 0.5-2.5 (3) MG/3ML SOLN Take 3 mLs by nebulization daily. And Q6H PRN    [provider]  lactulose (CHRONULAC) 10 GM/15ML solution Take by mouth daily. Give 15 mL by mouth    [provider]  metoCLOPramide (REGLAN) 5 MG tablet Take 5 mg by mouth 2 (two) times daily.    [provider]  ondansetron (ZOFRAN) 8 MG tablet Take 8 mg by  mouth every 8 (eight) hours as needed for nausea or vomiting.    [provider]  OXYGEN Inhale 2 L/min into the lungs continuous.     [provider]  Propylene Glycol (SYSTANE COMPLETE OP) Place 1 drop into both eyes 3 (three) times daily.    [provider]  senna (SENOKOT) 8.6 MG TABS tablet Take 1 tablet by mouth in the morning and at bedtime.    [provider]  zinc oxide 20 % ointment Apply 1 application topically as needed for irritation.    [provider]    Allergies    Contrast media [iodinated diagnostic agents] and Pravachol  Review of Systems   Review of Systems  Unable to perform ROS: Patient nonverbal  Respiratory:  Positive for cough.    Physical Exam Updated Vital Signs BP 96/62   Pulse 62   Temp 100 F (37.8 C) (Rectal)   Resp (!) 37   SpO2 100%   Physical Exam Vitals and nursing note reviewed.  Constitutional:      General: She is not in acute distress.    Appearance: She is well-developed. She is not diaphoretic.     Comments: Skin hot to touch   HENT:     Head: Normocephalic and atraumatic.  Eyes:     Pupils: Pupils are equal, round, and reactive to light.  Cardiovascular:     Rate and Rhythm: Regular rhythm. Tachycardia present.     Heart  sounds: No murmur heard.   No friction rub. No gallop.  Pulmonary:     Effort: Pulmonary effort is normal.     Breath sounds: No wheezing or rales.     Comments: Tachypnea Abdominal:     General: There is no distension.     Palpations: Abdomen is soft.     Tenderness: There is no abdominal tenderness.  Musculoskeletal:        General: No tenderness.     Cervical back: Normal range of motion and neck supple.  Skin:    General: Skin is warm and dry.  Neurological:     Mental Status: She is alert and oriented to person, place, and time.  Psychiatric:        Behavior: Behavior normal.    ED Results / Procedures / Treatments   Labs (all labs ordered are listed, but only abnormal results are displayed) Labs Reviewed  COMPREHENSIVE METABOLIC PANEL - Abnormal; Notable for the following components:      Result Value   CO2 33 (*)    Glucose, Bld 179 (*)    BUN 34 (*)    Calcium 8.3 (*)    Albumin 3.0 (*)    AST 14 (*)    All other components within normal limits  CBC WITH DIFFERENTIAL/PLATELET - Abnormal; Notable for the following components:   WBC 19.4 (*)    Hemoglobin 11.2 (*)    MCH 25.1 (*)    RDW 16.1 (*)    Neutro Abs 16.5 (*)    Monocytes Absolute 1.5 (*)    Abs Immature Granulocytes 0.12 (*)    All other components within normal limits  PROTIME-INR - Abnormal; Notable for the following components:   Prothrombin Time 15.4 (*)    All other components within normal limits  URINALYSIS, ROUTINE W REFLEX MICROSCOPIC - Abnormal; Notable for the following components:   Color, Urine AMBER (*)    APPearance HAZY (*)    All other components within normal limits  RESP PANEL  BY RT-PCR (FLU A&B, COVID) ARPGX2  CULTURE, BLOOD (ROUTINE X 2)  CULTURE, BLOOD (ROUTINE X 2)  URINE CULTURE  LACTIC ACID, PLASMA  APTT  LACTIC ACID, PLASMA  BLOOD GAS, ARTERIAL    EKG EKG Interpretation  Date/Time:  Saturday October 16 2021 00:37:09 EDT Ventricular Rate:  108 PR Interval:     QRS Duration: 90 QT Interval:  334 QTC Calculation: 448 R Axis:   54 Text Interpretation: Atrial fibrillation Borderline low voltage, extremity leads Since last tracing rate faster Otherwise no significant change Confirmed by Deno Etienne [phone]) on 10/16/2021 1:18:02 AM  Radiology DG Chest Port 1 View  Result Date: 10/16/2021 CLINICAL DATA:  Sepsis EXAM: PORTABLE CHEST 1 VIEW COMPARISON:  03/18/2018 FINDINGS: Abnormal right basilar opacities. Cardiomediastinal contours are normal. No pleural effusion or pneumothorax. IMPRESSION: Right basilar opacities, likely pneumonia Electronically Signed   By: Ulyses Jarred M.D.   On: 10/16/2021 01:35    Procedures Procedures   Medications Ordered in ED Medications  cefTRIAXone (ROCEPHIN) 2 g in sodium chloride 0.9 % 100 mL IVPB (2 g Intravenous New Bag/Given 10/16/21 0208)  azithromycin (ZITHROMAX) 500 mg in sodium chloride 0.9 % 250 mL IVPB (has no administration in time range)  lactated ringers bolus 1,000 mL (0 mLs Intravenous Stopped 10/16/21 0212)    ED Course  I have reviewed the triage vital signs and the nursing notes.  Pertinent labs & imaging results that were available during my care of the patient were reviewed by me and considered in my medical decision making (see chart for details).    MDM Rules/Calculators/A&P                           80 yo F with a chief complaints of cough and shortness of breath.  Patient significantly tachypneic upon arrival feels very warm to the touch.  We will start with a infectious work-up.  Bolus of IV fluids.  Reassess.  Chest x-ray viewed by me with likely bilateral pneumonia.  Will start on antibiotics.  Discussed with medicine for admission.  CRITICAL CARE Performed by: Cecilio Asper   Total critical care time: 35 minutes  Critical care time was exclusive of separately billable procedures and treating other patients.  Critical care was necessary to treat or prevent imminent or  life-threatening deterioration.  Critical care was time spent personally by me on the following activities: development of treatment plan with patient and/or surrogate as well as nursing, discussions with consultants, evaluation of patient's response to treatment, examination of patient, obtaining history from patient or surrogate, ordering and performing treatments and interventions, ordering and review of laboratory studies, ordering and review of radiographic studies, pulse oximetry and re-evaluation of patient's condition.   The patients results and plan were reviewed and discussed.   Any x-rays performed were independently reviewed by myself.   Differential diagnosis were considered with the presenting HPI.  Medications  cefTRIAXone (ROCEPHIN) 2 g in sodium chloride 0.9 % 100 mL IVPB (2 g Intravenous New Bag/Given 10/16/21 0208)  azithromycin (ZITHROMAX) 500 mg in sodium chloride 0.9 % 250 mL IVPB (has no administration in time range)  lactated ringers bolus 1,000 mL (0 mLs Intravenous Stopped 10/16/21 0212)    Vitals:   10/16/21 0100 10/16/21 0130 10/16/21 0138 10/16/21 0200  BP: 113/73 107/75  96/62  Pulse: (!) 104 62  62  Resp: (!) 35 20  (!) 37  Temp:   100 F (37.8 C)  TempSrc:   Rectal   SpO2: 100% 99%  100%    Final diagnoses:  Community acquired pneumonia of right lower lobe of lung  Acute on chronic respiratory failure with hypoxia ([place])    Admission/ observation were discussed with the admitting physician, patient and/or family and they are comfortable with the plan.   Final Clinical Impression(s) / ED Diagnoses Final diagnoses:  Community acquired pneumonia of right lower lobe of lung  Acute on chronic respiratory failure with hypoxia [facility])    Rx / DC Orders ED Discharge Orders     None        Deno Etienne, DO 10/16/21 0225

## 2021-10-16 NOTE — Assessment & Plan Note (Signed)
   COPD exacerbation with acute on chronic hypoxic and hypercapnic respiratory failure currently receiving BiPAP  Remainder of assessment and plan as noted above

## 2021-10-16 NOTE — Progress Notes (Signed)
Notified Lab That ABG being sent for analysis.

## 2021-10-16 NOTE — Assessment & Plan Note (Signed)
.   Patient presenting with right lower lobe pneumonia as an infectious source with evidence of concurrent organ dysfunction as evidenced by acute hypoxic and hypercapnic respiratory failure all suggesting concurrent sepsis . Initiating broad spectrum antibiotic therapy with ceftriaxone and azithromycin targeting right lower lobe pneumonia . Gentle intravenous hydration with isotonic fluids to avoid cardiogenic volume overload . Blood cultures and urine cultures obtained, antibiotic therapy will be deescalated based on these results . Close clinical monitoring in the progressive unit as patient is at high risk of rapid clinical decompensation

## 2021-10-16 NOTE — Assessment & Plan Note (Signed)
.   Patient is exhibiting progressive lethargy and confusion a marked change from baseline mentation and activity per family . This state has been developing over 2 to 3-day span of time. . Patient is likely suffering from acute metabolic encephalopathy secondary to underlying pneumonia and sepsis . Treating underlying infection with intravenous antibiotics.   . Will expand work-up of encephalopathy if patient fails to rapidly improve.

## 2021-10-16 NOTE — Assessment & Plan Note (Addendum)
   Patient suffering from acute hypercapnic and respiratory failure that is multifactorial in etiology due to substantial right lower lobe pneumonia in the setting of COPD exacerbation  VBG revealing pH of 7.25 with PCO2 of 80  Temporarily using BiPAP therapy to stabilize patient respiratory wise.  Patient is a high aspiration risk and therefore will wean off as quickly as possible, perhaps to high flow cannula if necessary  Initiating intravenous antibiotic therapy for suspected right lower lobe pneumonia  Aggressive bronchodilator therapy and systemic steroids for concurrent COPD exacerbation  Monitoring patient closely in the progressive unit

## 2021-10-16 NOTE — ED Triage Notes (Addendum)
Patient BIB [facility] from [facility]. Per staff patient has been more altered than normal today. Patient warm and clammy. Patient presents with a cough. Has dementia and A fib.  EMS 117 HR RR 40 End tidal 27 18G Left wrist 500 mL fluid 119/71

## 2021-10-16 NOTE — Progress Notes (Signed)
Pt transported on the bipap from ED to [place] without any complications.

## 2021-10-16 NOTE — Assessment & Plan Note (Signed)
   Patient has a known history of recurrent aspiration pneumonia due to history of advanced dementia  SLP evaluation for swallow eval  Remainder of assessment and plan as above

## 2021-10-16 NOTE — Progress Notes (Signed)
Pt placed on bipap per MD order. Pt is tolerating it well at this time. RT will continue to monitor.

## 2021-10-16 NOTE — Progress Notes (Signed)
PROGRESS NOTE    [name]  KVQ:[id number] DOB: May 18, 1941 DOA: 10/16/2021 PCP: [name], DO    Brief Narrative:  80 year old female with past medical history of ITP (2019), dementia, chronic atrial fibrillation (not on full dose anticoagulation), diastolic congestive heart failure (Echo 2016 EF 60-65%), COPD, remote history of right breast cancer, insulin-dependent diabetes mellitus type 2 and chronic respiratory failure (on 2lpm via [place]) who presents to [facility] long hospital emergency department from [facility] with progressively worsening shortness of breath and cough. Pt found to have PNA with concerns of COPD exacerbation  Assessment & Plan:   Principal Problem:   Sepsis due to pneumonia [facility]) Active Problems:   COPD with acute exacerbation ([place])   Chronic atrial fibrillation (HCC)   Type 2 diabetes mellitus with hyperglycemia, with long-term current use of insulin (HCC)   Dementia without behavioral disturbance (HCC)   Goals of care, counseling/discussion   Aspiration pneumonia of right lower lobe due to gastric secretions (HCC)   Acute on chronic respiratory failure with hypoxia and hypercapnia (HCC)   Acute metabolic encephalopathy    * Sepsis due to pneumonia [facility]) Patient presenting with right lower lobe pneumonia as an infectious source with evidence of concurrent organ dysfunction as evidenced by acute hypoxic and hypercapnic respiratory failure all suggesting concurrent sepsis Initiating broad spectrum antibiotic therapy with ceftriaxone and azithromycin targeting right lower lobe pneumonia Given gentle hydration with isotonic fluids to avoid cardiogenic volume overload Blood cultures and urine cultures obtained, antibiotic therapy will be deescalated based on these results Close clinical monitoring in the progressive unit as patient is at high risk of rapid clinical decompensation     Acute on chronic respiratory failure with hypoxia and hypercapnia  (HCC) Patient suffering from acute hypercapnic and respiratory failure that is multifactorial in etiology due to substantial right lower lobe pneumonia in the setting of COPD exacerbation Presenting VBG revealing pH of 7.25 with PCO2 of 80 On BiPAP to correct hypercarbia, improving Initiating intravenous antibiotic therapy for suspected right lower lobe pneumonia Aggressive bronchodilator therapy and systemic steroids for concurrent COPD exacerbation   Aspiration pneumonia of right lower lobe due to gastric secretions [facility]) Patient has a known history of recurrent aspiration pneumonia due to history of advanced dementia SLP evaluation pending   Acute metabolic encephalopathy Noted to be lethargic initially More awake when seen this AM This state has been developing over 2 to 3-day span of time. Patient is likely suffering from acute metabolic encephalopathy secondary to underlying pneumonia and sepsis Treating underlying infection with intravenous antibiotics.   Family reports baseline is with pt being non-verbal through very verbal    COPD with acute exacerbation ([place]) COPD exacerbation with acute on chronic hypoxic and hypercapnic respiratory failure currently receiving BiPAP   Dementia without behavioral disturbance (HCC) Noted to have dementia for approximately 8 years Minimizing uncomfortable stimuli as much as possible Fall precaution   Chronic atrial fibrillation [facility]) Patient presenting with atrial fibrillation with rapid ventricular response HR now normalized Patient is typically not on full dose anticoagulation per her outpatient regimen Monitoring patient on telemetry       Goals of care, counseling/discussion Patient had been confirmed to be DNR/DNI however noninvasive positive pressure ventilation is okay Pt's son noted to be okay with other modalities of care including intravenous fluids and antibiotics, breathing treatments and steroids    DVT prophylaxis:  Lovenox subq Code Status: DNR Family Communication: Pt in room, family at bedside  Status is: Inpatient  Remains inpatient appropriate because: severity of illness   Consultants:    Procedures:    Antimicrobials: Anti-infectives (From admission, onward)    Start     Dose/Rate Route Frequency Ordered Stop   10/17/21 0100  cefTRIAXone (ROCEPHIN) 2 g in sodium chloride 0.9 % 100 mL IVPB        2 g 200 mL/hr over 30 Minutes Intravenous Every 24 hours 10/16/21 0557 10/22/21 0059   10/17/21 0100  azithromycin (ZITHROMAX) 500 mg in sodium chloride 0.9 % 250 mL IVPB        500 mg 250 mL/hr over 60 Minutes Intravenous Every 24 hours 10/16/21 0557 10/22/21 0059   10/16/21 0145  cefTRIAXone (ROCEPHIN) 2 g in sodium chloride 0.9 % 100 mL IVPB        2 g 200 mL/hr over 30 Minutes Intravenous  Once 10/16/21 0144 10/16/21 0259   10/16/21 0145  azithromycin (ZITHROMAX) 500 mg in sodium chloride 0.9 % 250 mL IVPB        500 mg 250 mL/hr over 60 Minutes Intravenous  Once 10/16/21 0144 10/16/21 0400       Subjective: Unable to assess as pt is on bipap as well as mentation. Pt is awake, looking around  Objective: Vitals:   10/16/21 1030 10/16/21 1146 10/16/21 1445 10/16/21 1551  BP: 116/69 111/76 110/66   Pulse: 100 90 88 82  Resp: (!) 26 19 18 20   Temp:      TempSrc:      SpO2: 94% 95% 99% 97%    Intake/Output Summary (Last 24 hours) at 10/16/2021 1730 Last data filed at 10/16/2021 1636 Gross per 24 hour  Intake 2350 ml  Output --  Net 2350 ml   There were no vitals filed for this visit.  Examination: General exam: Awake, laying in bed, in nad Respiratory system: On bipap, decreased BS Cardiovascular system: regular rate, s1, s2 Gastrointestinal system: Soft, nondistended, positive BS Central nervous system: CN2-12 grossly intact, strength intact Extremities: Perfused, no clubbing Skin: Normal skin turgor, no notable skin lesions seen Psychiatry: Unable to assess     Data Reviewed: I have personally reviewed following labs and imaging studies  CBC: Recent Labs  Lab 10/16/21 0042  WBC 19.4*  NEUTROABS 16.5*  HGB 11.2*  HCT 36.6  MCV 82.1  PLT 329   Basic Metabolic Panel: Recent Labs  Lab 10/16/21 0042  NA 138  K 3.5  CL 98  CO2 33*  GLUCOSE 179*  BUN 34*  CREATININE 0.79  CALCIUM 8.3*   GFR: CrCl cannot be calculated (Unknown ideal weight.). Liver Function Tests: Recent Labs  Lab 10/16/21 0042  AST 14*  ALT 10  ALKPHOS 82  BILITOT 1.0  PROT 6.7  ALBUMIN 3.0*   No results for input(s): LIPASE, AMYLASE in the last 168 hours. No results for input(s): AMMONIA in the last 168 hours. Coagulation Profile: Recent Labs  Lab 10/16/21 0042  INR 1.2   Cardiac Enzymes: No results for input(s): CKTOTAL, CKMB, CKMBINDEX, TROPONINI in the last 168 hours. BNP (last 3 results) No results for input(s): PROBNP in the last 8760 hours. HbA1C: No results for input(s): HGBA1C in the last 72 hours. CBG: Recent Labs  Lab 10/16/21 0909 10/16/21 1157  GLUCAP 154* 137*   Lipid Profile: No results for input(s): CHOL, HDL, LDLCALC, TRIG, CHOLHDL, LDLDIRECT in the last 72 hours. Thyroid Function Tests: No results for input(s): TSH, T4TOTAL, FREET4, T3FREE, THYROIDAB in the last 72 hours. Anemia Panel: No results  for input(s): VITAMINB12, FOLATE, FERRITIN, TIBC, IRON, RETICCTPCT in the last 72 hours. Sepsis Labs: Recent Labs  Lab 10/16/21 0042 10/16/21 0242  LATICACIDVEN 0.9 0.9    Recent Results (from the past 240 hour(s))  Resp Panel by RT-PCR (Flu A&B, Covid) Nasopharyngeal Swab     Status: None   Collection Time: 10/16/21 12:33 AM   Specimen: Nasopharyngeal Swab; Nasopharyngeal(NP) swabs in vial transport medium  Result Value Ref Range Status   SARS Coronavirus 2 by RT PCR NEGATIVE NEGATIVE Final    Comment: (NOTE) SARS-CoV-2 target nucleic acids are NOT DETECTED.  The SARS-CoV-2 RNA is generally detectable in upper  respiratory specimens during the acute phase of infection. The lowest concentration of SARS-CoV-2 viral copies this assay can detect is 138 copies/mL. A negative result does not preclude SARS-Cov-2 infection and should not be used as the sole basis for treatment or other patient management decisions. A negative result may occur with  improper specimen collection/handling, submission of specimen other than nasopharyngeal swab, presence of viral mutation(s) within the areas targeted by this assay, and inadequate number of viral copies(<138 copies/mL). A negative result must be combined with clinical observations, patient history, and epidemiological information. The expected result is Negative.  Fact Sheet for Patients:  EntrepreneurPulse.com.au  Fact Sheet for Healthcare Providers:  IncredibleEmployment.be  This test is no t yet approved or cleared by the [place] FDA and  has been authorized for detection and/or diagnosis of SARS-CoV-2 by FDA under an Emergency Use Authorization (EUA). This EUA will remain  in effect (meaning this test can be used) for the duration of the COVID-19 declaration under Section 564(b)(1) of the Act, 21 U.S.C.section 360bbb-3(b)(1), unless the authorization is terminated  or revoked sooner.       Influenza A by PCR NEGATIVE NEGATIVE Final   Influenza B by PCR NEGATIVE NEGATIVE Final    Comment: (NOTE) The Xpert Xpress SARS-CoV-2/FLU/RSV plus assay is intended as an aid in the diagnosis of influenza from Nasopharyngeal swab specimens and should not be used as a sole basis for treatment. Nasal washings and aspirates are unacceptable for Xpert Xpress SARS-CoV-2/FLU/RSV testing.  Fact Sheet for Patients: EntrepreneurPulse.com.au  Fact Sheet for Healthcare Providers: IncredibleEmployment.be  This test is not yet approved or cleared by the [place] FDA and has been  authorized for detection and/or diagnosis of SARS-CoV-2 by FDA under an Emergency Use Authorization (EUA). This EUA will remain in effect (meaning this test can be used) for the duration of the COVID-19 declaration under Section 564(b)(1) of the Act, 21 U.S.C. section 360bbb-3(b)(1), unless the authorization is terminated or revoked.  Performed at [facility], [place] [address], [place], [place] [address]      Radiology Studies: DG Chest Port 1 View  Result Date: 10/16/2021 CLINICAL DATA:  Sepsis EXAM: PORTABLE CHEST 1 VIEW COMPARISON:  03/18/2018 FINDINGS: Abnormal right basilar opacities. Cardiomediastinal contours are normal. No pleural effusion or pneumothorax. IMPRESSION: Right basilar opacities, likely pneumonia Electronically Signed   By: Ulyses Jarred M.D.   On: 10/16/2021 01:35    Scheduled Meds:  aspirin EC  81 mg Oral Daily   budesonide  0.5 mg Nebulization BID   calcium carbonate  1,500 mg Oral Daily   donepezil  2.5 mg Oral QHS   enoxaparin (LOVENOX) injection  40 mg Subcutaneous Q24H   insulin aspart  0-15 Units Subcutaneous TID AC & HS   ipratropium-albuterol  3 mL Nebulization Q6H   lactulose  10 g Oral Daily  methylPREDNISolone (SOLU-MEDROL) injection  40 mg Intravenous Q12H   metoCLOPramide  5 mg Oral BID   nystatin  1 application Topical BID   polyvinyl alcohol  2 drop Both Eyes TID   senna  2 tablet Oral QHS   Continuous Infusions:  [START ON 10/17/2021] azithromycin     [START ON 10/17/2021] cefTRIAXone (ROCEPHIN)  IV       LOS: 0 days   Marylu Lund, MD [facility] Pager On Amion  If 7PM-7AM, please contact night-coverage 10/16/2021, 5:30 PM

## 2021-10-16 NOTE — Assessment & Plan Note (Signed)
   Very lengthy discussion concerning goals of care with both of the sons including one who is medical power of attorney  Patient has been confirmed to be DNR/DNI however noninvasive positive pressure ventilation is okay  Son is okay with other modalities of care including intravenous fluids and antibiotics, breathing treatments and steroids

## 2021-10-16 NOTE — Assessment & Plan Note (Signed)
.   Patient presenting with atrial fibrillation with rapid ventricular response . Rapid ventricular response is relatively mild and I anticipate rate to gradually improve as respiratory status improves and infection resolves . Patient is typically not on full dose anticoagulation per her outpatient regimen . Monitoring patient on telemetry

## 2021-10-16 NOTE — Assessment & Plan Note (Signed)
   Per my discussion with both of the sons patient has had a longstanding history of dementia for approximately 8 years  Minimizing uncomfortable stimuli as much as possible  Asking family to remain at bedside is much as possible to minimize bouts of confusion  Nursing to round on patient frequently  Fall precaution

## 2021-10-16 NOTE — H&P (Signed)
History and Physical    [name] SWF:[id number] DOB: 02/05/1941 DOA: 10/16/2021  PCP: [name], DO  Patient coming from: [facility] via EMS   Chief Complaint:  Chief Complaint  Patient presents with   Altered Mental Status   Cough     HPI:    80 year old female with past medical history of ITP (2019), dementia, chronic atrial fibrillation (not on full dose anticoagulation), diastolic congestive heart failure (Echo 2016 EF 60-65%), COPD, remote history of right breast cancer, insulin-dependent diabetes mellitus type 2 and chronic respiratory failure (on 2lpm via [place]) who presents to [facility] long hospital emergency department from [facility] with progressively worsening shortness of breath and cough.  Patient is an extremely poor historian due to advanced dementia and the majority the history is obtained from the daughter who is at the bedside as well as discussions with the emergency department staff.  According to the daughter, patient has been experiencing progressive cough over the past several days.  This is been associated with some shortness of breath as of late.  In the past 24 hours patient has additionally been exhibiting associated confusion and lethargy beyond her usual baseline mentation.  Due to persisting shortness of breath cough and confusion EMS was contacted and the patient was brought to [facility] emergency department for evaluation.  Upon evaluation in the emergency department patient was found to exhibit multiple SIRS criteria including tachycardia tachypnea and leukocytosis.  Patient was also found to be suffering from acute  respiratory failure and likely sepsis.  Patient was initiated on intravenous ceftriaxone and azithromycin for suspected right lower lobe pneumonia seen on chest x-ray.  Patient was additionally administered 1 L of lactated Ringer's solution. The hospitalist group was called to assess the patient for admission the  hospital.  Review of Systems:   Review of Systems  Unable to perform ROS: Mental acuity   Past Medical History:  Diagnosis Date   Acute ITP ([place])    Anemia    Asthma    Atrial fibrillation, persistent ([place]) 11/01/2013   Breast cancer of lower-inner quadrant of right female breast ([place]) 10/17/2011   Breast cancer, stage 1 ([place]) 10/17/2011   s/p right lumpectomy and XRT   Bronchitis    Bruises easily    Chills    Chronic anticoagulation, on Eliquis 11/01/2013   Chronic bronchitis (HCC)    Chronic diastolic CHF (congestive heart failure) ([place])    Constipation    COPD (chronic obstructive pulmonary disease) ([place])    Cough    Dementia without behavioral disturbance ([place])    Dyslipidemia    Fracture of left lower leg 06/1972   "no surgery; just casted it"   Generalized weakness    Gout attack    ankle, then wrist and hands   Gout without tophus    Hernia    History of nuclear stress test 02/09/2010   dipyridamole; normal pattern of perfusion in all regions with attenuation artifact in inferior region, low risk    Hypertension    Insomnia    Morbid obesity ([place])    On home oxygen therapy    "2L prn" (01/24/2014)   OSA on CPAP    Pneumonia 2012; 2014   Protein calorie malnutrition ([place])    Sarcoid    Sarcoidosis    Type II diabetes mellitus ([place])    Uterine cancer ([place])    s/p hysterectomy   Wheezing     Past Surgical History:  Procedure Laterality Date  ABDOMINAL HYSTERECTOMY  02/04/2005   BREAST BIOPSY Right 01/2011   BREAST LUMPECTOMY Right 01/2011   FLEXIBLE SIGMOIDOSCOPY N/A 04/16/2018   Procedure: FLEXIBLE SIGMOIDOSCOPY;  Surgeon: Otis Brace, MD;  Location: [facility];  Service: Gastroenterology;  Laterality: N/A;  No sedation    HERNIA REPAIR     TOTAL KNEE ARTHROPLASTY Bilateral 03/2010; 06/2011   left; right   TRANSTHORACIC ECHOCARDIOGRAM  07/2008   EF, LV size is normal; RVSP normal; mod calcif of MV apparatus   TUBAL LIGATION  8546'E   UMBILICAL HERNIA  REPAIR  02/04/2005     reports that she has never smoked. She has never used smokeless tobacco. She reports that she does not drink alcohol and does not use drugs.  Allergies  Allergen Reactions   Contrast Media [Iodinated Diagnostic Agents] Nausea And Vomiting   Pravachol Other (See Comments)    Muscle pain    Family History  Problem Relation Age of Onset   Diabetes Mother    Colon cancer Father    Diabetes Brother    Diabetes Sister    Lung cancer Brother    Cervical cancer Sister    Thrombocytopenia Neg Hx    Bleeding Disorder Neg Hx      Prior to Admission medications   Medication Sig Start Date End Date Taking? Authorizing Provider  acetaminophen (TYLENOL) 650 MG suppository Place 650 mg rectally every 4 (four) hours as needed.    [provider]  albuterol (PROVENTIL) (2.5 MG/3ML) 0.083% nebulizer solution Take by nebulization every 6 (six) hours as needed for wheezing or shortness of breath.    [provider]  Amino Acids-Protein Hydrolys (PRO-STAT PO) Take 30 mLs by mouth in the morning and at bedtime.    [provider]  aspirin EC 81 MG tablet Take 81 mg by mouth daily.    [provider]  bisacodyl (DULCOLAX) 10 MG suppository Place 10 mg rectally daily as needed for moderate constipation.    [provider]  budesonide (PULMICORT) 0.5 MG/2ML nebulizer solution Take 0.5 mg by nebulization 2 (two) times daily.    [provider]  calcium-vitamin D (OSCAL WITH D) 500-200 MG-UNIT tablet Take 1 tablet by mouth daily with breakfast.    [provider]  Dextran 70-Hypromellose, PF, 0.1-0.3 % SOLN Apply 2 drops to eye in the morning, at noon, and at bedtime.    [provider]  docusate sodium (COLACE) 100 MG capsule Take 100 mg by mouth daily as needed for mild constipation.    [provider]  donepezil (ARICEPT) 5 MG tablet Take 2.5 mg by mouth at bedtime.    [provider]   furosemide (LASIX) 20 MG tablet Take 0.5 tablets (10 mg total) by mouth daily. 04/17/18   Jani Gravel, MD  ipratropium-albuterol (DUONEB) 0.5-2.5 (3) MG/3ML SOLN Take 3 mLs by nebulization daily. And Q6H PRN    [provider]  lactulose (CHRONULAC) 10 GM/15ML solution Take by mouth daily. Give 15 mL by mouth    [provider]  metoCLOPramide (REGLAN) 5 MG tablet Take 5 mg by mouth 2 (two) times daily.    [provider]  ondansetron (ZOFRAN) 8 MG tablet Take 8 mg by mouth every 8 (eight) hours as needed for nausea or vomiting.    [provider]  OXYGEN Inhale 2 L/min into the lungs continuous.     [provider]  Propylene Glycol (SYSTANE COMPLETE OP) Place 1 drop into both eyes 3 (three)  times daily.    [provider]  senna (SENOKOT) 8.6 MG TABS tablet Take 1 tablet by mouth in the morning and at bedtime.    [provider]  zinc oxide 20 % ointment Apply 1 application topically as needed for irritation.    [provider]    Physical Exam: Vitals:   10/16/21 0330 10/16/21 0335 10/16/21 0400 10/16/21 0430  BP: 116/75 119/75 114/74 114/77  Pulse: (!) 110 100 96 100  Resp: (!) 34 (!) 29 (!) 31 (!) 28  Temp:      TempSrc:      SpO2: 99% 100% 97% 98%    Constitutional: Lethargic but arousable, disoriented, in mild respiratory distress. Skin: no rashes, no lesions, good skin turgor noted. Eyes: Pupils are equally reactive to light.  No evidence of scleral icterus or conjunctival pallor.  ENMT: BiPAP mask in place..  Moist mucous membranes noted.  No obvious exudate or lesions on visualized oropharynx Neck: normal, supple, no masses, no thyromegaly.  No evidence of jugular venous distension.   Respiratory: Significant right lower lobe rales with diffuse mild expiratory wheezing.  Increased respiratory effort without significant evidence of accessory muscle use.  Cardiovascular: Tachycardic with irregularly irregular  rhythm, no murmurs / rubs / gallops. No extremity edema. 2+ pedal pulses. No carotid bruits.  Chest:   Nontender without crepitus or deformity.   Back:   Nontender without crepitus or deformity. Abdomen: Abdomen is soft and nontender.  No evidence of intra-abdominal masses.  Positive bowel sounds noted in all quadrants.   Musculoskeletal: No joint deformity upper and lower extremities. Good ROM, no contractures. Normal muscle tone.  Neurologic: Patient is quite lethargic but arousable.  Patient is not following commands consistently.  Patient response to verbal and painful stimuli and does localize to pain.  Psychiatric: Unable to assess due to substantial lethargy.  Patient currently does not seem to possess insight as to her current situation.  Labs on Admission: I have personally reviewed following labs and imaging studies -   CBC: Recent Labs  Lab 10/16/21 0042  WBC 19.4*  NEUTROABS 16.5*  HGB 11.2*  HCT 36.6  MCV 82.1  PLT 951   Basic Metabolic Panel: Recent Labs  Lab 10/16/21 0042  NA 138  K 3.5  CL 98  CO2 33*  GLUCOSE 179*  BUN 34*  CREATININE 0.79  CALCIUM 8.3*   GFR: CrCl cannot be calculated (Unknown ideal weight.). Liver Function Tests: Recent Labs  Lab 10/16/21 0042  AST 14*  ALT 10  ALKPHOS 82  BILITOT 1.0  PROT 6.7  ALBUMIN 3.0*   No results for input(s): LIPASE, AMYLASE in the last 168 hours. No results for input(s): AMMONIA in the last 168 hours. Coagulation Profile: Recent Labs  Lab 10/16/21 0042  INR 1.2   Cardiac Enzymes: No results for input(s): CKTOTAL, CKMB, CKMBINDEX, TROPONINI in the last 168 hours. BNP (last 3 results) No results for input(s): PROBNP in the last 8760 hours. HbA1C: No results for input(s): HGBA1C in the last 72 hours. CBG: No results for input(s): GLUCAP in the last 168 hours. Lipid Profile: No results for input(s): CHOL, HDL, LDLCALC, TRIG, CHOLHDL, LDLDIRECT in the last 72 hours. Thyroid Function Tests: No  results for input(s): TSH, T4TOTAL, FREET4, T3FREE, THYROIDAB in the last 72 hours. Anemia Panel: No results for input(s): VITAMINB12, FOLATE, FERRITIN, TIBC, IRON, RETICCTPCT in the last 72 hours. Urine analysis:    Component Value Date/Time   COLORURINE AMBER (A) 10/16/2021  [place] (A) 10/16/2021 0042   LABSPEC 1.016 10/16/2021 0042   PHURINE 5.0 10/16/2021 0042   GLUCOSEU NEGATIVE 10/16/2021 0042   HGBUR NEGATIVE 10/16/2021 0042   BILIRUBINUR NEGATIVE 10/16/2021 [place] 10/16/2021 0042   PROTEINUR NEGATIVE 10/16/2021 0042   UROBILINOGEN 1.0 10/06/2015 1210   NITRITE NEGATIVE 10/16/2021 0042   LEUKOCYTESUR NEGATIVE 10/16/2021 0042    Radiological Exams on Admission - Personally Reviewed: DG Chest Port 1 View  Result Date: 10/16/2021 CLINICAL DATA:  Sepsis EXAM: PORTABLE CHEST 1 VIEW COMPARISON:  03/18/2018 FINDINGS: Abnormal right basilar opacities. Cardiomediastinal contours are normal. No pleural effusion or pneumothorax. IMPRESSION: Right basilar opacities, likely pneumonia Electronically Signed   By: Ulyses Jarred M.D.   On: 10/16/2021 01:35    EKG: Personally reviewed.  Rhythm is atrial fibrillation with heart rate of 108 bpm.  No dynamic ST segment changes appreciated.  Assessment/Plan  * Sepsis due to pneumonia [facility]) Patient presenting with right lower lobe pneumonia as an infectious source with evidence of concurrent organ dysfunction as evidenced by acute hypoxic and hypercapnic respiratory failure all suggesting concurrent sepsis Initiating broad spectrum antibiotic therapy with ceftriaxone and azithromycin targeting right lower lobe pneumonia Gentle intravenous hydration with isotonic fluids to avoid cardiogenic volume overload Blood cultures and urine cultures obtained, antibiotic therapy will be deescalated based on these results Close clinical monitoring in the progressive unit as patient is at high risk of rapid clinical  decompensation   Acute on chronic respiratory failure with hypoxia and hypercapnia (HCC) Patient suffering from acute hypercapnic and respiratory failure that is multifactorial in etiology due to substantial right lower lobe pneumonia in the setting of COPD exacerbation VBG revealing pH of 7.25 with PCO2 of 80 Temporarily using BiPAP therapy to stabilize patient respiratory wise.  Patient is a high aspiration risk and therefore will wean off as quickly as possible, perhaps to high flow cannula if necessary Initiating intravenous antibiotic therapy for suspected right lower lobe pneumonia Aggressive bronchodilator therapy and systemic steroids for concurrent COPD exacerbation Monitoring patient closely in the progressive unit  Aspiration pneumonia of right lower lobe due to gastric secretions [facility]) Patient has a known history of recurrent aspiration pneumonia due to history of advanced dementia SLP evaluation for swallow eval Remainder of assessment and plan as above  Acute metabolic encephalopathy Patient is exhibiting progressive lethargy and confusion a marked change from baseline mentation and activity per family This state has been developing over 2 to 3-day span of time. Patient is likely suffering from acute metabolic encephalopathy secondary to underlying pneumonia and sepsis Treating underlying infection with intravenous antibiotics.   Will expand work-up of encephalopathy if patient fails to rapidly improve.   COPD with acute exacerbation (HCC) COPD exacerbation with acute on chronic hypoxic and hypercapnic respiratory failure currently receiving BiPAP Remainder of assessment and plan as noted above  Dementia without behavioral disturbance ([place]) Per my discussion with both of the sons patient has had a longstanding history of dementia for approximately 8 years Minimizing uncomfortable stimuli as much as possible Asking family to remain at bedside is much as possible to  minimize bouts of confusion Nursing to round on patient frequently Fall precaution  Chronic atrial fibrillation [facility]) Patient presenting with atrial fibrillation with rapid ventricular response Rapid ventricular response is relatively mild and I anticipate rate to gradually improve as respiratory status improves and infection resolves Patient is typically not on full dose anticoagulation per her outpatient regimen Monitoring patient on  telemetry    Goals of care, counseling/discussion Very lengthy discussion concerning goals of care with both of the sons including one who is medical power of attorney Patient has been confirmed to be DNR/DNI however noninvasive positive pressure ventilation is okay Son is okay with other modalities of care including intravenous fluids and antibiotics, breathing treatments and steroids     Code Status:  DNR  code status decision has been confirmed with: son who is POA Family Communication: Patient face conversation with both sons have both been updated on plan of care.  Status is: Inpatient  Remains inpatient appropriate because: Acute hypoxic and hypercapnic respiratory failure in setting of sepsis and right lower lobe pneumonia with extremely high risk of rapid clinical decompensation requiring noninvasive positive pressure ventilation, intravenous antibiotics, intravenous fluids, bronchodilator therapy and systemic steroids.  CRITICAL CARE ATTESTATION   Patient is at significant risk of morbidity and mortality with acute hypercapnic and hypoxic respiratory failure requiring initiation of BiPAP therapy with active titration.  Patient is also requiring intravenous antibiotic therapy, intravenous volume resuscitation, aggressive bronchodilator therapy, intravenous systemic steroids as well as having a lengthy discussion with family including power of attorney concerning patient's prognosis and goals of care.  Critical care time spent: 70 minutes.         Vernelle Emerald MD [facility] Pager [phone]  If 7PM-7AM, please contact night-coverage [email or web address] Use universal [place] password for that web site. If you do not have the password, please call the hospital operator.  10/16/2021, 6:44 AM

## 2021-10-17 ENCOUNTER — Inpatient Hospital Stay (HOSPITAL_COMMUNITY): Payer: Medicare Other

## 2021-10-17 LAB — GLUCOSE, CAPILLARY
Glucose-Capillary: 157 mg/dL — ABNORMAL HIGH (ref 70–99)
Glucose-Capillary: 223 mg/dL — ABNORMAL HIGH (ref 70–99)
Glucose-Capillary: 290 mg/dL — ABNORMAL HIGH (ref 70–99)
Glucose-Capillary: 233 mg/dL — ABNORMAL HIGH (ref 70–99)

## 2021-10-17 LAB — CBC WITH DIFFERENTIAL/PLATELET
Abs Immature Granulocytes: 0.05 10*3/uL (ref 0.00–0.07)
Basophils Absolute: 0 10*3/uL (ref 0.0–0.1)
Basophils Relative: 0 %
Eosinophils Absolute: 0 10*3/uL (ref 0.0–0.5)
Eosinophils Relative: 0 %
HCT: 33.5 % — ABNORMAL LOW (ref 36.0–46.0)
Hemoglobin: 10.1 g/dL — ABNORMAL LOW (ref 12.0–15.0)
Immature Granulocytes: 1 %
Lymphocytes Relative: 5 %
Lymphs Abs: 0.4 10*3/uL — ABNORMAL LOW (ref 0.7–4.0)
MCH: 25 pg — ABNORMAL LOW (ref 26.0–34.0)
MCHC: 30.1 g/dL (ref 30.0–36.0)
MCV: 82.9 fL (ref 80.0–100.0)
Monocytes Absolute: 0.1 10*3/uL (ref 0.1–1.0)
Monocytes Relative: 1 %
Neutro Abs: 7.8 10*3/uL — ABNORMAL HIGH (ref 1.7–7.7)
Neutrophils Relative %: 93 %
Platelets: 244 10*3/uL (ref 150–400)
RBC: 4.04 MIL/uL (ref 3.87–5.11)
RDW: 15.8 % — ABNORMAL HIGH (ref 11.5–15.5)
WBC: 8.4 10*3/uL (ref 4.0–10.5)
nRBC: 0 % (ref 0.0–0.2)

## 2021-10-17 LAB — COMPREHENSIVE METABOLIC PANEL
ALT: 7 U/L (ref 0–44)
AST: 13 U/L — ABNORMAL LOW (ref 15–41)
Albumin: 2.6 g/dL — ABNORMAL LOW (ref 3.5–5.0)
Alkaline Phosphatase: 62 U/L (ref 38–126)
Anion gap: 8 (ref 5–15)
BUN: 36 mg/dL — ABNORMAL HIGH (ref 8–23)
CO2: 30 mmol/L (ref 22–32)
Calcium: 8.3 mg/dL — ABNORMAL LOW (ref 8.9–10.3)
Chloride: 103 mmol/L (ref 98–111)
Creatinine, Ser: 0.6 mg/dL (ref 0.44–1.00)
GFR, Estimated: >60 mL/min (ref >60)
Glucose, Bld: 162 mg/dL — ABNORMAL HIGH (ref 70–99)
Potassium: 4.5 mmol/L (ref 3.5–5.1)
Sodium: 141 mmol/L (ref 135–145)
Total Bilirubin: 0.7 mg/dL (ref 0.3–1.2)
Total Protein: 6.4 g/dL — ABNORMAL LOW (ref 6.5–8.1)

## 2021-10-17 LAB — MRSA NEXT GEN BY PCR, NASAL: MRSA by PCR Next Gen: DETECTED — AB

## 2021-10-17 LAB — URINE CULTURE: Culture: NO GROWTH

## 2021-10-17 LAB — PROCALCITONIN: Procalcitonin: 0.45 ng/mL

## 2021-10-17 LAB — HEMOGLOBIN A1C
Hgb A1c MFr Bld: 5.7 % — ABNORMAL HIGH (ref 4.8–5.6)
Mean Plasma Glucose: 116.89 mg/dL

## 2021-10-17 LAB — MAGNESIUM: Magnesium: 1.9 mg/dL (ref 1.7–2.4)

## 2021-10-17 MED ORDER — CHLORHEXIDINE GLUCONATE CLOTH 2 % EX PADS
6.0000 | MEDICATED_PAD | Freq: Every day | CUTANEOUS | Status: DC
  Administered 2021-10-19 – 2021-10-21 (×3): 6 via TOPICAL

## 2021-10-17 MED ORDER — MUPIROCIN 2 % EX OINT
1.0000 "application " | TOPICAL_OINTMENT | Freq: Two times a day (BID) | CUTANEOUS | Status: DC
  Administered 2021-10-17 – 2021-10-21 (×9): 1 via NASAL
  Filled 2021-10-17 (×2): qty 22

## 2021-10-17 NOTE — Progress Notes (Signed)
Pt MEWS score TRIGGER YELLOW, resp rate increased at times throughout the day, while eating and repositioning. Family at bedside. Pt received meds as ordered and breathing treatments. Pt spoke few words without increased SOB. Will cont to monitor. SRP, RN

## 2021-10-17 NOTE — Evaluation (Signed)
Clinical/Bedside Swallow Evaluation Patient Details  Name: [name] MRN: [id number] Date of Birth: 09-Aug-1941  Today's Date: 10/17/2021 Time: SLP Start Time (ACUTE ONLY): 5009 SLP Stop Time (ACUTE ONLY): 1715 SLP Time Calculation (min) (ACUTE ONLY): 20 min  Past Medical History:  Past Medical History:  Diagnosis Date   Acute ITP ([place])    Anemia    Asthma    Atrial fibrillation, persistent ([place]) 11/01/2013   Breast cancer of lower-inner quadrant of right female breast ([place]) 10/17/2011   Breast cancer, stage 1 ([place]) 10/17/2011   s/p right lumpectomy and XRT   Bronchitis    Bruises easily    Chills    Chronic anticoagulation, on Eliquis 11/01/2013   Chronic bronchitis (HCC)    Chronic diastolic CHF (congestive heart failure) (HCC)    Constipation    COPD (chronic obstructive pulmonary disease) ([place])    Cough    Dementia without behavioral disturbance ([place])    Dyslipidemia    Fracture of left lower leg 06/1972   "no surgery; just casted it"   Generalized weakness    Gout attack    ankle, then wrist and hands   Gout without tophus    Hernia    History of nuclear stress test 02/09/2010   dipyridamole; normal pattern of perfusion in all regions with attenuation artifact in inferior region, low risk    Hypertension    Insomnia    Morbid obesity ([place])    On home oxygen therapy    "2L prn" (01/24/2014)   OSA on CPAP    Pneumonia 2012; 2014   Protein calorie malnutrition ([place])    Sarcoid    Sarcoidosis    Type II diabetes mellitus ([place])    Uterine cancer ([place])    s/p hysterectomy   Wheezing    Past Surgical History:  Past Surgical History:  Procedure Laterality Date   ABDOMINAL HYSTERECTOMY  02/04/2005   BREAST BIOPSY Right 01/2011   BREAST LUMPECTOMY Right 01/2011   FLEXIBLE SIGMOIDOSCOPY N/A 04/16/2018   Procedure: FLEXIBLE SIGMOIDOSCOPY;  Surgeon: Otis Brace, MD;  Location: [facility];  Service: Gastroenterology;  Laterality: N/A;  No sedation    HERNIA REPAIR      TOTAL KNEE ARTHROPLASTY Bilateral 03/2010; 06/2011   left; right   TRANSTHORACIC ECHOCARDIOGRAM  07/2008   EF, LV size is normal; RVSP normal; mod calcif of MV apparatus   TUBAL LIGATION  3818'E   UMBILICAL HERNIA REPAIR  02/04/2005   HPI:  Patient is an 80 y.o. female with PMH: ITP (2019), dementia, chronic atrial fibrillation (not on full dose anticoagulation), diastolic congestive heart failure (Echo 2016 EF 60-65%), COPD, remote history of right breast cancer, insulin-dependent diabetes mellitus type 2 and chronic respiratory failure (on 2lpm via [place]) who presents to [facility] long hospital emergency department from [facility] with progressively worsening shortness of breath and cough. Pt found to have PNA with concerns of COPD exacerbation.    Assessment / Plan / Recommendation  Clinical Impression  Patient presents with clinical s/s dysphagia as per this bedside swallow evaluation. In addition, son reported that patient has h/o instances of coughing with PO intake however he denies that she is getting any modified diet consistencies at SNF where she has resided for past 8 years. Patient exhibited a couple instances of immediate and delayed cough with thin liquids, however both instances were brief and with patient appearing to return to baseline quickly. No observed difficulties with PO's of puree solids. As patient has h/o  reported dysphagia, SLP recommending to proceed with objective swallow study via MBS next date. Son who was present in room in agreement. SLP Visit Diagnosis: Dysphagia, unspecified (R13.10)    Aspiration Risk  Moderate aspiration risk    Diet Recommendation Dysphagia 3 (Mech soft);Thin liquid   Liquid Administration via: Cup;Straw Medication Administration: Crushed with puree Supervision: Full supervision/cueing for compensatory strategies;Staff to assist with self feeding Compensations: Slow rate;Small sips/bites;Minimize environmental distractions Postural Changes:  Seated upright at 90 degrees    Other  Recommendations Oral Care Recommendations: Oral care BID;Staff/trained caregiver to provide oral care    Recommendations for follow up therapy are one component of a multi-disciplinary discharge planning process, led by the attending physician.  Recommendations may be updated based on patient status, additional functional criteria and insurance authorization.  Follow up Recommendations 24 hour supervision/assistance;Skilled Nursing facility      Frequency and Duration min 1 x/week  1 week       Prognosis Prognosis for Safe Diet Advancement: Good Barriers to Reach Goals: Cognitive deficits      Swallow Study   General Date of Onset: 10/16/21 HPI: Patient is an 80 y.o. female with PMH: ITP (2019), dementia, chronic atrial fibrillation (not on full dose anticoagulation), diastolic congestive heart failure (Echo 2016 EF 60-65%), COPD, remote history of right breast cancer, insulin-dependent diabetes mellitus type 2 and chronic respiratory failure (on 2lpm via [place]) who presents to [facility] long hospital emergency department from [facility] with progressively worsening shortness of breath and cough. Pt found to have PNA with concerns of COPD exacerbation. Type of Study: Bedside Swallow Evaluation Previous Swallow Assessment: remote (2002 MBS) Diet Prior to this Study: Dysphagia 3 (soft);Thin liquids Temperature Spikes Noted: No Respiratory Status: Nasal cannula History of Recent Intubation: No Behavior/Cognition: Alert;Cooperative;Pleasant mood;Confused Oral Cavity Assessment: Within Functional Limits Oral Care Completed by SLP: Yes Oral Cavity - Dentition: Edentulous Self-Feeding Abilities: Total assist Patient Positioning: Upright in bed Baseline Vocal Quality: Normal Volitional Cough: Cognitively unable to elicit Volitional Swallow: Unable to elicit    Oral/Motor/Sensory Function Overall Oral Motor/Sensory Function: Other (comment) (no  observed oral motor weakness but patient unable to follow directions to complete full exam)   Ice Chips     Thin Liquid Thin Liquid: Impaired Presentation: Cup;Spoon Pharyngeal  Phase Impairments: Suspected delayed Swallow;Cough - Delayed;Cough - Immediate    Nectar Thick     Honey Thick     Puree Puree: Within functional limits Presentation: Spoon   Solid     Solid: Not tested      Sonia Baller, MA, CCC-SLP Speech Therapy

## 2021-10-17 NOTE — Progress Notes (Signed)
PROGRESS NOTE    [name]  IRW:[id number] DOB: 1941/03/06 DOA: 10/16/2021 PCP: [name], DO    Brief Narrative:  80 year old female with past medical history of ITP (2019), dementia, chronic atrial fibrillation (not on full dose anticoagulation), diastolic congestive heart failure (Echo 2016 EF 60-65%), COPD, remote history of right breast cancer, insulin-dependent diabetes mellitus type 2 and chronic respiratory failure (on 2lpm via ) who presents to [facility] long hospital emergency department from [facility] with progressively worsening shortness of breath and cough. Pt found to have PNA with concerns of COPD exacerbation  Assessment & Plan:   Principal Problem:   Sepsis due to pneumonia [facility]) Active Problems:   COPD with acute exacerbation ([place])   Chronic atrial fibrillation ([place])   Type 2 diabetes mellitus with hyperglycemia, with long-term current use of insulin (HCC)   Dementia without behavioral disturbance (HCC)   Goals of care, counseling/discussion   Aspiration pneumonia of right lower lobe due to gastric secretions (HCC)   Acute on chronic respiratory failure with hypoxia and hypercapnia (HCC)   Acute metabolic encephalopathy    * Sepsis due to pneumonia [facility]) Patient presenting with right lower lobe pneumonia as an infectious source with evidence of concurrent organ dysfunction as evidenced by acute hypoxic and hypercapnic respiratory failure all suggesting concurrent sepsis Continued on ceftriaxone and azithromycin with clinical improvement Blood cultures neg thus far     Acute on chronic respiratory failure with hypoxia and hypercapnia ([place]) Patient suffering from acute hypercapnic and respiratory failure that is multifactorial in etiology due to substantial right lower lobe pneumonia in the setting of COPD exacerbation Presenting VBG revealing pH of 7.25 with PCO2 of 80 Improved with bipap Cont abx per above Aggressive bronchodilator therapy and  systemic steroids for concurrent COPD exacerbation   Aspiration pneumonia of right lower lobe due to gastric secretions ([place]) Patient has a known history of recurrent aspiration pneumonia due to history of advanced dementia Pending SLP eval Pt improving with current regimen   Acute metabolic encephalopathy Noted to be lethargic initially More awake when seen this AM This state has been developing over 2 to 3-day span of time. Patient is likely suffering from acute metabolic encephalopathy secondary to underlying pneumonia and sepsis Treating underlying infection with intravenous antibiotics.   Family reports baseline is with pt being non-verbal through very verbal    COPD with acute exacerbation ([place]) Now off bipap Improved, no audible wheezing   Dementia without behavioral disturbance (HCC) Noted to have dementia for approximately 8 years Minimizing uncomfortable stimuli as much as possible Cont fall precautions PT/OT eval   Chronic atrial fibrillation ([place]) Patient presenting with atrial fibrillation with rapid ventricular response HR now normalized Patient is typically not on full dose anticoagulation per her outpatient regimen Monitoring patient on telemetry       Goals of care, counseling/discussion Patient had been confirmed to be DNR/DNI however noninvasive positive pressure ventilation is okay Pt's son noted to be okay with other modalities of care including intravenous fluids and antibiotics, breathing treatments and steroids    DVT prophylaxis: Lovenox subq Code Status: DNR Family Communication: Pt in room, family at bedside  Status is: Inpatient  Remains inpatient appropriate because: severity of illness   Consultants:    Procedures:    Antimicrobials: Anti-infectives (From admission, onward)    Start     Dose/Rate Route Frequency Ordered Stop   10/17/21 0100  cefTRIAXone (ROCEPHIN) 2 g in sodium chloride 0.9 % 100 mL IVPB  2 g 200 mL/hr over  30 Minutes Intravenous Every 24 hours 10/16/21 0557 10/22/21 0059   10/17/21 0100  azithromycin (ZITHROMAX) 500 mg in sodium chloride 0.9 % 250 mL IVPB        500 mg 250 mL/hr over 60 Minutes Intravenous Every 24 hours 10/16/21 0557 10/22/21 0059   10/16/21 0145  cefTRIAXone (ROCEPHIN) 2 g in sodium chloride 0.9 % 100 mL IVPB        2 g 200 mL/hr over 30 Minutes Intravenous  Once 10/16/21 0144 10/16/21 0259   10/16/21 0145  azithromycin (ZITHROMAX) 500 mg in sodium chloride 0.9 % 250 mL IVPB        500 mg 250 mL/hr over 60 Minutes Intravenous  Once 10/16/21 0144 10/16/21 0400       Subjective: Cannot obtain as pt is not verbal  Objective: Vitals:   10/17/21 1146 10/17/21 1200 10/17/21 1220 10/17/21 1317  BP:    105/69  Pulse:    (!) 107  Resp:  (!) 26  (!) 30  Temp:    98 F (36.7 C)  TempSrc:    Oral  SpO2: 97%  98% 99%  Weight:      Height:        Intake/Output Summary (Last 24 hours) at 10/17/2021 1633 Last data filed at 10/17/2021 1300 Gross per 24 hour  Intake 2291.37 ml  Output 200 ml  Net 2091.37 ml    Filed Weights   10/16/21 1841 10/16/21 2100  Weight: 90 kg 84.1 kg    Examination: General exam: Not conversant, in no acute distress Respiratory system: normal chest rise, clear, no audible wheezing Cardiovascular system: regular rhythm, s1-s2 Gastrointestinal system: Nondistended, nontender, pos BS Central nervous system: No seizures, no tremors Extremities: No cyanosis, no joint deformities Skin: No rashes, no pallor Psychiatry: Unable to assess given nonverbal state  Data Reviewed: I have personally reviewed following labs and imaging studies  CBC: Recent Labs  Lab 10/16/21 0042 10/17/21 0418  WBC 19.4* 8.4  NEUTROABS 16.5* 7.8*  HGB 11.2* 10.1*  HCT 36.6 33.5*  MCV 82.1 82.9  PLT 310 706    Basic Metabolic Panel: Recent Labs  Lab 10/16/21 0042 10/16/21 2159 10/17/21 0418  NA 138  --  141  K 3.5  --  4.5  CL 98  --  103  CO2 33*  --   30  GLUCOSE 179*  --  162*  BUN 34*  --  36*  CREATININE 0.79  --  0.60  CALCIUM 8.3*  --  8.3*  MG  --  1.8 1.9    GFR: Estimated Creatinine Clearance: 49.8 mL/min (by C-G formula based on SCr of 0.6 mg/dL). Liver Function Tests: Recent Labs  Lab 10/16/21 0042 10/17/21 0418  AST 14* 13*  ALT 10 7  ALKPHOS 82 62  BILITOT 1.0 0.7  PROT 6.7 6.4*  ALBUMIN 3.0* 2.6*    No results for input(s): LIPASE, AMYLASE in the last 168 hours. No results for input(s): AMMONIA in the last 168 hours. Coagulation Profile: Recent Labs  Lab 10/16/21 0042  INR 1.2    Cardiac Enzymes: No results for input(s): CKTOTAL, CKMB, CKMBINDEX, TROPONINI in the last 168 hours. BNP (last 3 results) No results for input(s): PROBNP in the last 8760 hours. HbA1C: Recent Labs    10/16/21 2159  HGBA1C 5.7*   CBG: Recent Labs  Lab 10/16/21 1157 10/16/21 1737 10/16/21 2218 10/17/21 0808 10/17/21 1206  GLUCAP 137* 156* 132* 157* 223*  Lipid Profile: No results for input(s): CHOL, HDL, LDLCALC, TRIG, CHOLHDL, LDLDIRECT in the last 72 hours. Thyroid Function Tests: No results for input(s): TSH, T4TOTAL, FREET4, T3FREE, THYROIDAB in the last 72 hours. Anemia Panel: No results for input(s): VITAMINB12, FOLATE, FERRITIN, TIBC, IRON, RETICCTPCT in the last 72 hours. Sepsis Labs: Recent Labs  Lab 10/16/21 0042 10/16/21 0242 10/16/21 2159  PROCALCITON  --   --  0.45  LATICACIDVEN 0.9 0.9  --      Recent Results (from the past 240 hour(s))  Resp Panel by RT-PCR (Flu A&B, Covid) Nasopharyngeal Swab     Status: None   Collection Time: 10/16/21 12:33 AM   Specimen: Nasopharyngeal Swab; Nasopharyngeal(NP) swabs in vial transport medium  Result Value Ref Range Status   SARS Coronavirus 2 by RT PCR NEGATIVE NEGATIVE Final    Comment: (NOTE) SARS-CoV-2 target nucleic acids are NOT DETECTED.  The SARS-CoV-2 RNA is generally detectable in upper respiratory specimens during the acute phase of  infection. The lowest concentration of SARS-CoV-2 viral copies this assay can detect is 138 copies/mL. A negative result does not preclude SARS-Cov-2 infection and should not be used as the sole basis for treatment or other patient management decisions. A negative result may occur with  improper specimen collection/handling, submission of specimen other than nasopharyngeal swab, presence of viral mutation(s) within the areas targeted by this assay, and inadequate number of viral copies(<138 copies/mL). A negative result must be combined with clinical observations, patient history, and epidemiological information. The expected result is Negative.  Fact Sheet for Patients:  EntrepreneurPulse.com.au  Fact Sheet for Healthcare Providers:  IncredibleEmployment.be  This test is no t yet approved or cleared by the [place] FDA and  has been authorized for detection and/or diagnosis of SARS-CoV-2 by FDA under an Emergency Use Authorization (EUA). This EUA will remain  in effect (meaning this test can be used) for the duration of the COVID-19 declaration under Section 564(b)(1) of the Act, 21 U.S.C.section 360bbb-3(b)(1), unless the authorization is terminated  or revoked sooner.       Influenza A by PCR NEGATIVE NEGATIVE Final   Influenza B by PCR NEGATIVE NEGATIVE Final    Comment: (NOTE) The Xpert Xpress SARS-CoV-2/FLU/RSV plus assay is intended as an aid in the diagnosis of influenza from Nasopharyngeal swab specimens and should not be used as a sole basis for treatment. Nasal washings and aspirates are unacceptable for Xpert Xpress SARS-CoV-2/FLU/RSV testing.  Fact Sheet for Patients: EntrepreneurPulse.com.au  Fact Sheet for Healthcare Providers: IncredibleEmployment.be  This test is not yet approved or cleared by the [place] FDA and has been authorized for detection and/or diagnosis of SARS-CoV-2  by FDA under an Emergency Use Authorization (EUA). This EUA will remain in effect (meaning this test can be used) for the duration of the COVID-19 declaration under Section 564(b)(1) of the Act, 21 U.S.C. section 360bbb-3(b)(1), unless the authorization is terminated or revoked.  Performed at [facility], [place] [address], [place], [place] [address]   Blood Culture (routine x 2)     Status: None (Preliminary result)   Collection Time: 10/16/21 12:42 AM   Specimen: BLOOD  Result Value Ref Range Status   Specimen Description   Final    BLOOD BLOOD RIGHT HAND Performed at [place] [address] [place],  [address]    Special Requests   Final    BOTTLES DRAWN AEROBIC AND ANAEROBIC Blood Culture adequate volume Performed at [facility],  [place] [address], [place], [place] [address]    Culture   Final    NO GROWTH 1 DAY Performed at [facility], [place] [address] [place], [place] [address]    Report Status PENDING  Incomplete  Urine Culture     Status: None   Collection Time: 10/16/21 12:42 AM   Specimen: In/Out Cath Urine  Result Value Ref Range Status   Specimen Description   Final    IN/OUT CATH URINE Performed at [place] [address], [place], [place] [address]    Special Requests   Final    NONE Performed at [facility], [place] [address] [place], [place] [address]    Culture   Final    NO GROWTH Performed at [facility], [place] [address], [place], [place] [address]    Report Status 10/17/2021 FINAL  Final  Blood Culture (routine x 2)     Status: None (Preliminary result)   Collection Time: 10/16/21 12:47 AM   Specimen: BLOOD  Result Value Ref Range Status   Specimen Description   Final    BLOOD BLOOD LEFT FOREARM Performed at [place] [address], [place], [place] [address]    Special Requests   Final    BOTTLES  DRAWN AEROBIC AND ANAEROBIC Blood Culture adequate volume Performed at [place] [address] [place] [employer], [place] [address]    Culture   Final    NO GROWTH 1 DAY Performed at [facility], [place] [address] [place], [place] [address]    Report Status PENDING  Incomplete  MRSA Next Gen by PCR, Nasal     Status: Abnormal   Collection Time: 10/17/21  1:30 PM   Specimen: Nasal Mucosa; Nasal Swab  Result Value Ref Range Status   MRSA by PCR Next Gen DETECTED (A) NOT DETECTED Final    Comment: RESULT CALLED TO, READ BACK BY AND VERIFIED WITH: TICKET, S. ON 10/17/2021 @ 1450 BY MECIAL J. (NOTE) The GeneXpert MRSA Assay (FDA approved for NASAL specimens only), is one component of a comprehensive MRSA colonization surveillance program. It is not intended to diagnose MRSA infection nor to guide or monitor treatment for MRSA infections. Test performance is not FDA approved in patients less than 35 years old. Performed at [facility], [place] [address], [place],  [address]       Radiology Studies: DG Chest Port 1 View  Result Date: 10/16/2021 CLINICAL DATA:  Sepsis EXAM: PORTABLE CHEST 1 VIEW COMPARISON:  03/18/2018 FINDINGS: Abnormal right basilar opacities. Cardiomediastinal contours are normal. No pleural effusion or pneumothorax. IMPRESSION: Right basilar opacities, likely pneumonia Electronically Signed   By: Ulyses Jarred M.D.   On: 10/16/2021 01:35    Scheduled Meds:  aspirin EC  81 mg Oral Daily   budesonide  0.5 mg Nebulization BID   calcium carbonate  1,500 mg Oral Daily   [START ON 10/18/2021] Chlorhexidine Gluconate Cloth  6 each Topical Q0600   donepezil  2.5 mg Oral QHS   enoxaparin (LOVENOX) injection  40 mg Subcutaneous Q24H   insulin aspart  0-15 Units Subcutaneous TID AC & HS   ipratropium-albuterol  3 mL Nebulization BID   lactulose  10 g Oral Daily   methylPREDNISolone (SOLU-MEDROL) injection  40 mg Intravenous Q12H    metoCLOPramide  5 mg Oral BID   mupirocin ointment  1 application Nasal BID   nystatin  1 application  Topical BID   polyvinyl alcohol  2 drop Both Eyes TID   senna  2 tablet Oral QHS   Continuous Infusions:  azithromycin 500 mg (10/17/21 0105)   cefTRIAXone (ROCEPHIN)  IV 2 g (10/17/21 0014)     LOS: 1 day   Marylu Lund, MD [facility] Pager On Amion  If 7PM-7AM, please contact night-coverage 10/17/2021, 4:33 PM

## 2021-10-17 NOTE — Plan of Care (Signed)

## 2021-10-18 ENCOUNTER — Inpatient Hospital Stay (HOSPITAL_COMMUNITY): Payer: Medicare Other

## 2021-10-18 LAB — CBC
HCT: 32.1 % — ABNORMAL LOW (ref 36.0–46.0)
Hemoglobin: 9.8 g/dL — ABNORMAL LOW (ref 12.0–15.0)
MCH: 24.7 pg — ABNORMAL LOW (ref 26.0–34.0)
MCHC: 30.5 g/dL (ref 30.0–36.0)
MCV: 80.9 fL (ref 80.0–100.0)
Platelets: 306 10*3/uL (ref 150–400)
RBC: 3.97 MIL/uL (ref 3.87–5.11)
RDW: 15.3 % (ref 11.5–15.5)
WBC: 10.5 10*3/uL (ref 4.0–10.5)
nRBC: 0 % (ref 0.0–0.2)

## 2021-10-18 LAB — COMPREHENSIVE METABOLIC PANEL
ALT: 11 U/L (ref 0–44)
AST: 16 U/L (ref 15–41)
Albumin: 2.5 g/dL — ABNORMAL LOW (ref 3.5–5.0)
Alkaline Phosphatase: 69 U/L (ref 38–126)
Anion gap: 9 (ref 5–15)
BUN: 47 mg/dL — ABNORMAL HIGH (ref 8–23)
CO2: 27 mmol/L (ref 22–32)
Calcium: 8.1 mg/dL — ABNORMAL LOW (ref 8.9–10.3)
Chloride: 98 mmol/L (ref 98–111)
Creatinine, Ser: 0.76 mg/dL (ref 0.44–1.00)
GFR, Estimated: >60 mL/min (ref >60)
Glucose, Bld: 270 mg/dL — ABNORMAL HIGH (ref 70–99)
Potassium: 4.4 mmol/L (ref 3.5–5.1)
Sodium: 134 mmol/L — ABNORMAL LOW (ref 135–145)
Total Bilirubin: 0.3 mg/dL (ref 0.3–1.2)
Total Protein: 6.1 g/dL — ABNORMAL LOW (ref 6.5–8.1)

## 2021-10-18 LAB — GLUCOSE, CAPILLARY
Glucose-Capillary: 214 mg/dL — ABNORMAL HIGH (ref 70–99)
Glucose-Capillary: 259 mg/dL — ABNORMAL HIGH (ref 70–99)
Glucose-Capillary: 174 mg/dL — ABNORMAL HIGH (ref 70–99)
Glucose-Capillary: 237 mg/dL — ABNORMAL HIGH (ref 70–99)

## 2021-10-18 MED ORDER — AZITHROMYCIN 250 MG PO TABS
500.0000 mg | ORAL_TABLET | Freq: Every day | ORAL | Status: AC
  Administered 2021-10-18 – 2021-10-19 (×2): 500 mg via ORAL
  Filled 2021-10-18 (×2): qty 2

## 2021-10-18 MED ORDER — METHYLPREDNISOLONE SODIUM SUCC 40 MG IJ SOLR
40.0000 mg | Freq: Every day | INTRAMUSCULAR | Status: DC
Start: 2021-10-19 — End: 2021-10-21
  Administered 2021-10-19 – 2021-10-21 (×3): 40 mg via INTRAVENOUS
  Filled 2021-10-18 (×3): qty 1

## 2021-10-18 NOTE — Progress Notes (Signed)
Modified Barium Swallow Progress Note  Patient Details  Name: MAJA MCCAFFERY MRN: [id number] Date of Birth: [birth year]  Today's Date: 10/18/2021  Modified Barium Swallow completed.  Full report located under Chart Review in the Imaging Section.  Brief recommendations include the following:  Clinical Impression  Patient presents with a mild oropharyngeal dysphagia as per this MBS. Her dentures were in place and appeared to be well-fitting. During oral phase of swallow, patient exhibited mild anterior to posterior transit delay with puree solids and with regular texture solids, she exhibited delay in mastication, anterior to posterior oral transit and piecemeal swallowing of bolus. She was able to transit barium tablet with sips of thin liquid barium without significant delay. During pharyngeal phase of swallow, patient exhibited delay approximately in between vallecular and pyriform sinuses with thin liquids but aspiration events and a couple questionable flash penetration events (difficult to fully view as patient's shoulder was blocking some of view). Only trace amount of vallecular residuals observed which cleared with subsequent swallows. Barium tablet became briefly lodged in vallecular sinus but then transited fully with second sip of thin liquid barium. Esophageal sweep did not reveal any delays in bolus transit or retention of barium. SLP is recommending Dys 3 solids, thin liquids diet. SLP will follow briefly while hospitalized and recommending f/u from SLP at [facility].   Swallow Evaluation Recommendations       SLP Diet Recommendations: Dysphagia 3 (Mech soft) solids;Thin liquid   Liquid Administration via: Cup;Straw   Medication Administration: Whole meds with liquid   Supervision: Full supervision/cueing for compensatory strategies;Staff to assist with self feeding   Compensations: Minimize environmental distractions;Slow rate;Small sips/bites   Postural Changes: Seated upright at  90 degrees   Oral Care Recommendations: Oral care BID;Staff/trained caregiver to provide oral care        Sonia Baller, MA, CCC-SLP Speech Therapy

## 2021-10-18 NOTE — Evaluation (Signed)
Physical Therapy One Time Evaluation Patient Details Name: [name] MRN: [id number] DOB: 02-17-1941 Today's Date: 10/18/2021  History of Present Illness  Pt is an 80 year old female admitted 10/16/21 for sepsis due to pneumonia and acute hypercapnic and respiratory failure that is multifactorial in etiology due to substantial right lower lobe pneumonia in the setting of COPD exacerbation.  PMHx: ITP (2019), dementia, chronic atrial fibrillation (not on full dose anticoagulation), diastolic congestive heart failure (Echo 2016 EF 60-65%), COPD, remote history of right breast cancer, insulin-dependent diabetes mellitus type 2 and chronic respiratory failure (on 2lpm via [place]) and morbid obesity with hypoventilation syndrome, obstructive sleep apnea  Clinical Impression  Patient evaluated by Physical Therapy with no further acute PT needs identified. All education has been completed and the patient has no further questions.  Pt from [facility] and appears total care.  Pt also refusing to mobilize OOB and fearful of falling.  See below for any follow-up Physical Therapy or equipment needs. PT is signing off. Thank you for this referral.      Recommendations for follow up therapy are one component of a multi-disciplinary discharge planning process, led by the attending physician.  Recommendations may be updated based on patient status, additional functional criteria and insurance authorization.  Follow Up Recommendations Long-term institutional care without follow-up therapy (return to facility)    Assistance Recommended at Discharge    Functional Status Assessment Patient has not had a recent decline in their functional status  Equipment Recommendations       Recommendations for Other Services       Precautions / Restrictions Precautions Precautions: Fall      Mobility  Bed Mobility Overal bed mobility: Needs Assistance Bed Mobility: Supine to Sit;Sit to Supine     Supine to sit:  Max assist;HOB elevated Sit to supine: Max assist;HOB elevated   General bed mobility comments: able to assist with extremities however somewhat reluctant stating "I'm not standing; I'm not getting up", pt allowed assist to sitting EOB and took meds in applesauce with RN    Transfers                        Ambulation/Gait                  Stairs            Wheelchair Mobility    Modified Rankin (Stroke Patients Only)       Balance Overall balance assessment: Needs assistance Sitting-balance support: No upper extremity supported;Feet supported Sitting balance-Leahy Scale: Fair                                       Pertinent Vitals/Pain Pain Assessment: Faces Pain Score: 0-No pain Faces Pain Scale: No hurt Pain Intervention(s): Monitored during session;Repositioned    Home Living Family/patient expects to be discharged to:: Skilled nursing facility                        Prior Function Prior Level of Function : Needs assist       Physical Assist : Mobility (physical);ADLs (physical)     Mobility Comments: pt fearful of falls, likely bed level, possibly OOB to w/c at facility however pt poor historian ADLs Comments: total care per RN     Hand Dominance  Extremity/Trunk Assessment   Upper Extremity Assessment Upper Extremity Assessment: Generalized weakness    Lower Extremity Assessment Lower Extremity Assessment: Generalized weakness       Communication   Communication: No difficulties  Cognition Arousal/Alertness: Awake/alert   Overall Cognitive Status: History of cognitive impairments - at baseline                                 General Comments: pt can become quickly agitated, pt refused mobilizing but able to be encouraged to sit EOB to take her "snack" (meds in applesauce with RN); pt reports fear of falling, hx of dementia        General Comments      Exercises      Assessment/Plan    PT Assessment Patient does not need any further PT services  PT Problem List         PT Treatment Interventions      PT Goals (Current goals can be found in the Care Plan section)  Acute Rehab PT Goals PT Goal Formulation: All assessment and education complete, DC therapy    Frequency     Barriers to discharge        Co-evaluation               AM-PAC PT "6 Clicks" Mobility  Outcome Measure Help needed turning from your back to your side while in a flat bed without using bedrails?: Total Help needed moving from lying on your back to sitting on the side of a flat bed without using bedrails?: Total Help needed moving to and from a bed to a chair (including a wheelchair)?: Total Help needed standing up from a chair using your arms (e.g., wheelchair or bedside chair)?: Total Help needed to walk in hospital room?: Total Help needed climbing 3-5 steps with a railing? : Total 6 Click Score: 6    End of Session   Activity Tolerance: Patient tolerated treatment well Patient left: with call bell/phone within reach;in bed   PT Visit Diagnosis: Other abnormalities of gait and mobility (R26.89)    Time: 1610-9604 PT Time Calculation (min) (ACUTE ONLY): 10 min   Charges:   PT Evaluation $PT Eval Low Complexity: 1 Low        Kati PT, DPT Acute Rehabilitation Services Pager: [phone] Office: [phone]   Myrtis Hopping [place] 10/18/2021, 1:01 PM

## 2021-10-18 NOTE — Progress Notes (Signed)
PROGRESS NOTE    [name]  LKT:[id number] DOB: 12-22-1940 DOA: 10/16/2021 PCP: [name], DO    Brief Narrative:  80 year old female with past medical history of ITP (2019), dementia, chronic atrial fibrillation (not on full dose anticoagulation), diastolic congestive heart failure (Echo 2016 EF 60-65%), COPD, remote history of right breast cancer, insulin-dependent diabetes mellitus type 2 and chronic respiratory failure (on 2lpm via [place]) who presents to [facility] long hospital emergency department from [facility] with progressively worsening shortness of breath and cough. Pt found to have PNA with concerns of COPD exacerbation  Assessment & Plan:   Principal Problem:   Sepsis due to pneumonia [facility]) Active Problems:   COPD with acute exacerbation ([place])   Chronic atrial fibrillation ([place])   Type 2 diabetes mellitus with hyperglycemia, with long-term current use of insulin (HCC)   Dementia without behavioral disturbance (HCC)   Goals of care, counseling/discussion   Aspiration pneumonia of right lower lobe due to gastric secretions (HCC)   Acute on chronic respiratory failure with hypoxia and hypercapnia (HCC)   Acute metabolic encephalopathy    * Sepsis due to pneumonia [facility]) Patient presenting with right lower lobe pneumonia as an infectious source with evidence of concurrent organ dysfunction as evidenced by acute hypoxic and hypercapnic respiratory failure all suggesting concurrent sepsis Continued improvement on ceftriaxone and azithromycin Blood cultures neg thus far     Acute on chronic respiratory failure with hypoxia and hypercapnia ([place]) Patient suffering from acute hypercapnic and respiratory failure that is multifactorial in etiology due to substantial right lower lobe pneumonia in the setting of COPD exacerbation Presenting VBG revealing pH of 7.25 with PCO2 of 80 Clinically much improved after bipap Cont abx per above Aggressive bronchodilator therapy and  systemic steroids for concurrent COPD exacerbation Weaned to [facility] this AM   Aspiration pneumonia of right lower lobe due to gastric secretions [facility]) Patient has a known history of recurrent aspiration pneumonia due to history of advanced dementia Pt improving with current regimen SLP eval and MBS reviewed. Recs for dysphagia 3   Acute metabolic encephalopathy Noted to be lethargic initially, now much more alert Suspect hypercarbic encephalopathy vs toxic metabolic encephalopathy related to PNA Treating underlying infection with intravenous antibiotics.   Family reports baseline is with pt being non-verbal through very verbal. Seems at baseline now   COPD with acute exacerbation ([place]) Now off bipap Improved, no audible wheezing   Dementia without behavioral disturbance (HCC) Noted to have dementia for approximately 8 years Minimizing uncomfortable stimuli as much as possible Cont fall precautions PT/OT recs to return to long term care   Chronic atrial fibrillation [facility]) Patient presenting with atrial fibrillation with rapid ventricular response HR rate controlled Patient is typically not on full dose anticoagulation per her outpatient regimen Monitoring patient on telemetry      Goals of care, counseling/discussion Patient had been confirmed to be DNR/DNI however noninvasive positive pressure ventilation is okay Pt's son noted to be okay with other modalities of care including intravenous fluids and antibiotics, breathing treatments and steroids    DVT prophylaxis: Lovenox subq Code Status: DNR Family Communication: Pt in room, family at bedside  Status is: Inpatient  Remains inpatient appropriate because: severity of illness   Consultants:    Procedures:    Antimicrobials: Anti-infectives (From admission, onward)    Start     Dose/Rate Route Frequency Ordered Stop   10/18/21 2200  azithromycin (ZITHROMAX) tablet 500 mg  500 mg Oral Daily 10/18/21 1325  10/20/21 2159   10/17/21 0100  cefTRIAXone (ROCEPHIN) 2 g in sodium chloride 0.9 % 100 mL IVPB        2 g 200 mL/hr over 30 Minutes Intravenous Every 24 hours 10/16/21 0557 10/22/21 0059   10/17/21 0100  azithromycin (ZITHROMAX) 500 mg in sodium chloride 0.9 % 250 mL IVPB  Status:  Discontinued        500 mg 250 mL/hr over 60 Minutes Intravenous Every 24 hours 10/16/21 0557 10/18/21 1325   10/16/21 0145  cefTRIAXone (ROCEPHIN) 2 g in sodium chloride 0.9 % 100 mL IVPB        2 g 200 mL/hr over 30 Minutes Intravenous  Once 10/16/21 0144 10/16/21 0259   10/16/21 0145  azithromycin (ZITHROMAX) 500 mg in sodium chloride 0.9 % 250 mL IVPB        500 mg 250 mL/hr over 60 Minutes Intravenous  Once 10/16/21 0144 10/16/21 0400       Subjective: Unable to assess given mentation  Objective: Vitals:   10/17/21 2110 10/18/21 0525 10/18/21 0803 10/18/21 1347  BP: (!) 152/135 132/85  112/80  Pulse: (!) 106 68  85  Resp: 16 16  20   Temp: 98 F (36.7 C) 97.7 F (36.5 C)  97.9 F (36.6 C)  TempSrc: Oral Oral  Oral  SpO2: 97% 98% 96% 98%  Weight:      Height:        Intake/Output Summary (Last 24 hours) at 10/18/2021 1631 Last data filed at 10/18/2021 1348 Gross per 24 hour  Intake 980 ml  Output 1200 ml  Net -220 ml    Filed Weights   10/16/21 1841 10/16/21 2100  Weight: 90 kg 84.1 kg    Examination: General exam: Awake, laying in bed, in nad Respiratory system: Normal respiratory effort, no wheezing Cardiovascular system: regular rate, s1, s2 Gastrointestinal system: Soft, nondistended, positive BS Central nervous system: CN2-12 grossly intact, strength intact Extremities: Perfused, no clubbing Skin: Normal skin turgor, no notable skin lesions seen Psychiatry: Cannot assess given mentation  Data Reviewed: I have personally reviewed following labs and imaging studies  CBC: Recent Labs  Lab 10/16/21 0042 10/17/21 0418 10/18/21 0350  WBC 19.4* 8.4 10.5  NEUTROABS 16.5*  7.8*  --   HGB 11.2* 10.1* 9.8*  HCT 36.6 33.5* 32.1*  MCV 82.1 82.9 80.9  PLT 310 244 468    Basic Metabolic Panel: Recent Labs  Lab 10/16/21 0042 10/16/21 2159 10/17/21 0418 10/18/21 0350  NA 138  --  141 134*  K 3.5  --  4.5 4.4  CL 98  --  103 98  CO2 33*  --  30 27  GLUCOSE 179*  --  162* 270*  BUN 34*  --  36* 47*  CREATININE 0.79  --  0.60 0.76  CALCIUM 8.3*  --  8.3* 8.1*  MG  --  1.8 1.9  --     GFR: Estimated Creatinine Clearance: 49.8 mL/min (by C-G formula based on SCr of 0.76 mg/dL). Liver Function Tests: Recent Labs  Lab 10/16/21 0042 10/17/21 0418 10/18/21 0350  AST 14* 13* 16  ALT 10 7 11   ALKPHOS 82 62 69  BILITOT 1.0 0.7 0.3  PROT 6.7 6.4* 6.1*  ALBUMIN 3.0* 2.6* 2.5*    No results for input(s): LIPASE, AMYLASE in the last 168 hours. No results for input(s): AMMONIA in the last 168 hours. Coagulation Profile: Recent Labs  Lab 10/16/21 0042  INR 1.2    Cardiac Enzymes: No results for input(s): CKTOTAL, CKMB, CKMBINDEX, TROPONINI in the last 168 hours. BNP (last 3 results) No results for input(s): PROBNP in the last 8760 hours. HbA1C: Recent Labs    10/16/21 2159  HGBA1C 5.7*    CBG: Recent Labs  Lab 10/17/21 1206 10/17/21 1724 10/17/21 2116 10/18/21 0754 10/18/21 1134  GLUCAP 223* 290* 233* 214* 259*    Lipid Profile: No results for input(s): CHOL, HDL, LDLCALC, TRIG, CHOLHDL, LDLDIRECT in the last 72 hours. Thyroid Function Tests: No results for input(s): TSH, T4TOTAL, FREET4, T3FREE, THYROIDAB in the last 72 hours. Anemia Panel: No results for input(s): VITAMINB12, FOLATE, FERRITIN, TIBC, IRON, RETICCTPCT in the last 72 hours. Sepsis Labs: Recent Labs  Lab 10/16/21 0042 10/16/21 0242 10/16/21 2159  PROCALCITON  --   --  0.45  LATICACIDVEN 0.9 0.9  --      Recent Results (from the past 240 hour(s))  Resp Panel by RT-PCR (Flu A&B, Covid) Nasopharyngeal Swab     Status: None   Collection Time: 10/16/21 12:33 AM    Specimen: Nasopharyngeal Swab; Nasopharyngeal(NP) swabs in vial transport medium  Result Value Ref Range Status   SARS Coronavirus 2 by RT PCR NEGATIVE NEGATIVE Final    Comment: (NOTE) SARS-CoV-2 target nucleic acids are NOT DETECTED.  The SARS-CoV-2 RNA is generally detectable in upper respiratory specimens during the acute phase of infection. The lowest concentration of SARS-CoV-2 viral copies this assay can detect is 138 copies/mL. A negative result does not preclude SARS-Cov-2 infection and should not be used as the sole basis for treatment or other patient management decisions. A negative result may occur with  improper specimen collection/handling, submission of specimen other than nasopharyngeal swab, presence of viral mutation(s) within the areas targeted by this assay, and inadequate number of viral copies(<138 copies/mL). A negative result must be combined with clinical observations, patient history, and epidemiological information. The expected result is Negative.  Fact Sheet for Patients:  EntrepreneurPulse.com.au  Fact Sheet for Healthcare Providers:  IncredibleEmployment.be  This test is no t yet approved or cleared by the [place] FDA and  has been authorized for detection and/or diagnosis of SARS-CoV-2 by FDA under an Emergency Use Authorization (EUA). This EUA will remain  in effect (meaning this test can be used) for the duration of the COVID-19 declaration under Section 564(b)(1) of the Act, 21 U.S.C.section 360bbb-3(b)(1), unless the authorization is terminated  or revoked sooner.       Influenza A by PCR NEGATIVE NEGATIVE Final   Influenza B by PCR NEGATIVE NEGATIVE Final    Comment: (NOTE) The Xpert Xpress SARS-CoV-2/FLU/RSV plus assay is intended as an aid in the diagnosis of influenza from Nasopharyngeal swab specimens and should not be used as a sole basis for treatment. Nasal washings and aspirates are  unacceptable for Xpert Xpress SARS-CoV-2/FLU/RSV testing.  Fact Sheet for Patients: EntrepreneurPulse.com.au  Fact Sheet for Healthcare Providers: IncredibleEmployment.be  This test is not yet approved or cleared by the [place] FDA and has been authorized for detection and/or diagnosis of SARS-CoV-2 by FDA under an Emergency Use Authorization (EUA). This EUA will remain in effect (meaning this test can be used) for the duration of the COVID-19 declaration under Section 564(b)(1) of the Act, 21 U.S.C. section 360bbb-3(b)(1), unless the authorization is terminated or revoked.  Performed at [facility], [place] [address], [place], [place] [address]   Blood Culture (routine x 2)     Status:  None (Preliminary result)   Collection Time: 10/16/21 12:42 AM   Specimen: BLOOD  Result Value Ref Range Status   Specimen Description   Final    BLOOD BLOOD RIGHT HAND Performed at [facility] [address], [place], [place] [address]    Special Requests   Final    BOTTLES DRAWN AEROBIC AND ANAEROBIC Blood Culture adequate volume Performed at [facility] [address], [place], [place] [address]    Culture   Final    NO GROWTH 2 DAYS Performed at [facility] [address], [place], [place] [address]    Report Status PENDING  Incomplete  Urine Culture     Status: None   Collection Time: 10/16/21 12:42 AM   Specimen: In/Out Cath Urine  Result Value Ref Range Status   Specimen Description   Final    IN/OUT CATH URINE Performed at [place] [address] [place], [place] [address]    Special Requests   Final    NONE Performed at [facility], [place] [address] [place], [place] [address]    Culture   Final    NO GROWTH Performed at [facility], [place] [address], [place], [place] [address]    Report Status 10/17/2021 FINAL  Final   Blood Culture (routine x 2)     Status: None (Preliminary result)   Collection Time: 10/16/21 12:47 AM   Specimen: BLOOD  Result Value Ref Range Status   Specimen Description   Final    BLOOD BLOOD LEFT FOREARM Performed at [facility] [address] [place], [place] [address]    Special Requests   Final    BOTTLES DRAWN AEROBIC AND ANAEROBIC Blood Culture adequate volume Performed at [facility] [address] [place], [place] [address]    Culture   Final    NO GROWTH 2 DAYS Performed at [facility] [address] [place], [place] [address]    Report Status PENDING  Incomplete  MRSA Next Gen by PCR, Nasal     Status: Abnormal   Collection Time: 10/17/21  1:30 PM   Specimen: Nasal Mucosa; Nasal Swab  Result Value Ref Range Status   MRSA by PCR Next Gen DETECTED (A) NOT DETECTED Final    Comment: RESULT CALLED TO, READ BACK BY AND VERIFIED WITH: TICKET, S. ON 10/17/2021 @ 1450 BY MECIAL J. (NOTE) The GeneXpert MRSA Assay (FDA approved for NASAL specimens only), is one component of a comprehensive MRSA colonization surveillance program. It is not intended to diagnose MRSA infection nor to guide or monitor treatment for MRSA infections. Test performance is not FDA approved in patients less than 92 years old. Performed at [facility], [place] [address], [place],  [address]       Radiology Studies: DG Chest 1 View  Result Date: 10/17/2021 CLINICAL DATA:  Cough and possible aspiration EXAM: PORTABLE CHEST 1 VIEW COMPARISON:  10/16/2021 FINDINGS: Cardiac shadow is enlarged. Increased vascular congestion is noted with mild interstitial edema. No focal infiltrate or sizable effusion is noted. No pneumothorax is seen. No bony abnormality is noted. IMPRESSION: Increase in vascular congestion with mild interstitial edema. Electronically Signed   By: Inez Catalina M.D.   On: 10/17/2021 21:46   DG Swallowing  Func-Speech Pathology  Result Date: 10/18/2021 Table formatting from the original result was not included. Objective Swallowing Evaluation: Type of Study: MBS-Modified Barium  Swallow Study  Patient Details Name: [name] MRN: [id number] Date of Birth: Dec 11, 1941 Today's Date: 10/18/2021 Time: SLP Start Time (ACUTE ONLY): 42 -SLP Stop Time (ACUTE ONLY): 1250 SLP Time Calculation (min) (ACUTE ONLY): 20 min Past Medical History: Past Medical History: Diagnosis Date  Acute ITP ([place])   Anemia   Asthma   Atrial fibrillation, persistent ([place]) 11/01/2013  Breast cancer of lower-inner quadrant of right female breast ([place]) 10/17/2011  Breast cancer, stage 1 ([place]) 10/17/2011  s/p right lumpectomy and XRT  Bronchitis   Bruises easily   Chills   Chronic anticoagulation, on Eliquis 11/01/2013  Chronic bronchitis (HCC)   Chronic diastolic CHF (congestive heart failure) (HCC)   Constipation   COPD (chronic obstructive pulmonary disease) ([place])   Cough   Dementia without behavioral disturbance ([place])   Dyslipidemia   Fracture of left lower leg 06/1972  "no surgery; just casted it"  Generalized weakness   Gout attack   ankle, then wrist and hands  Gout without tophus   Hernia   History of nuclear stress test 02/09/2010  dipyridamole; normal pattern of perfusion in all regions with attenuation artifact in inferior region, low risk   Hypertension   Insomnia   Morbid obesity ([place])   On home oxygen therapy   "2L prn" (01/24/2014)  OSA on CPAP   Pneumonia 2012; 2014  Protein calorie malnutrition ([place])   Sarcoid   Sarcoidosis   Type II diabetes mellitus ([place])   Uterine cancer ([place])   s/p hysterectomy  Wheezing  Past Surgical History: Past Surgical History: Procedure Laterality Date  ABDOMINAL HYSTERECTOMY  02/04/2005  BREAST BIOPSY Right 01/2011  BREAST LUMPECTOMY Right 01/2011  FLEXIBLE SIGMOIDOSCOPY N/A 04/16/2018  Procedure: FLEXIBLE SIGMOIDOSCOPY;  Surgeon: Otis Brace, MD;  Location: [facility];  Service: Gastroenterology;   Laterality: N/A;  No sedation   HERNIA REPAIR    TOTAL KNEE ARTHROPLASTY Bilateral 03/2010; 06/2011  left; right  TRANSTHORACIC ECHOCARDIOGRAM  07/2008  EF, LV size is normal; RVSP normal; mod calcif of MV apparatus  TUBAL LIGATION  9024'O  UMBILICAL HERNIA REPAIR  02/04/2005 HPI: Patient is an 80 y.o. female with PMH: ITP (2019), dementia, chronic atrial fibrillation (not on full dose anticoagulation), diastolic congestive heart failure (Echo 2016 EF 60-65%), COPD, remote history of right breast cancer, insulin-dependent diabetes mellitus type 2 and chronic respiratory failure (on 2lpm via [place]) who presents to [facility] emergency department from [facility] with progressively worsening shortness of breath and cough. Pt found to have PNA with concerns of COPD exacerbation.  Subjective: pleasant, alert, talking to herself Assessment / Plan / Recommendation CHL IP CLINICAL IMPRESSIONS 10/18/2021 Clinical Impression Patient presents with a mild oropharyngeal dysphagia as per this MBS. Her dentures were in place and appeared to be well-fitting. During oral phase of swallow, patient exhibited mild anterior to posterior transit delay with puree solids and with regular texture solids, she exhibited delay in mastication, anterior to posterior oral transit and piecemeal swallowing of bolus. She was able to transit barium tablet with sips of thin liquid barium without significant delay. During pharyngeal phase of swallow, patient exhibited delay approximately in between vallecular and pyriform sinuses with thin liquids but aspiration events and a couple questionable flash penetration events (difficult to fully view as patient's shoulder was blocking some of view). Only trace amount of vallecular residuals observed which cleared with subsequent swallows. Barium tablet became briefly lodged in vallecular sinus but then transited fully with second sip of thin liquid barium.  Esophageal sweep did not reveal any delays in  bolus transit or retention of barium. SLP is recommending Dys 3 solids, thin liquids diet. SLP will follow briefly while hospitalized and recommending f/u from SLP at [facility]. SLP Visit Diagnosis -- Attention and concentration deficit following -- Frontal lobe and executive function deficit following -- Impact on safety and function --   CHL IP TREATMENT RECOMMENDATION 10/18/2021 Treatment Recommendations Therapy as outlined in treatment plan below   Prognosis 10/18/2021 Prognosis for Safe Diet Advancement Good Barriers to Reach Goals Cognitive deficits Barriers/Prognosis Comment -- CHL IP DIET RECOMMENDATION 10/18/2021 SLP Diet Recommendations Dysphagia 3 (Mech soft) solids;Thin liquid Liquid Administration via Cup;Straw Medication Administration Whole meds with liquid Compensations Minimize environmental distractions;Slow rate;Small sips/bites Postural Changes Seated upright at 90 degrees   CHL IP OTHER RECOMMENDATIONS 10/18/2021 Recommended Consults -- Oral Care Recommendations Oral care BID;Staff/trained caregiver to provide oral care Other Recommendations --   CHL IP FOLLOW UP RECOMMENDATIONS 10/18/2021 Follow up Recommendations 24 hour supervision/assistance;Skilled Nursing facility   [facility] IP FREQUENCY AND DURATION 10/18/2021 Speech Therapy Frequency (ACUTE ONLY) min 1 x/week Treatment Duration 1 week      CHL IP ORAL PHASE 10/18/2021 Oral Phase -- Oral - Pudding Teaspoon -- Oral - Pudding Cup -- Oral - Honey Teaspoon -- Oral - Honey Cup -- Oral - Nectar Teaspoon -- Oral - Nectar Cup -- Oral - Nectar Straw -- Oral - Thin Teaspoon -- Oral - Thin Cup WFL Oral - Thin Straw WFL Oral - Puree Reduced posterior propulsion;Weak lingual manipulation;Delayed oral transit Oral - Mech Soft -- Oral - Regular Weak lingual manipulation;Piecemeal swallowing;Reduced posterior propulsion;Impaired mastication;Delayed oral transit Oral - Multi-Consistency -- Oral - Pill Decreased bolus cohesion Oral Phase - Comment --  CHL IP PHARYNGEAL  PHASE 10/18/2021 Pharyngeal Phase -- Pharyngeal- Pudding Teaspoon -- Pharyngeal -- Pharyngeal- Pudding Cup -- Pharyngeal -- Pharyngeal- Honey Teaspoon -- Pharyngeal -- Pharyngeal- Honey Cup -- Pharyngeal -- Pharyngeal- Nectar Teaspoon -- Pharyngeal -- Pharyngeal- Nectar Cup -- Pharyngeal -- Pharyngeal- Nectar Straw -- Pharyngeal -- Pharyngeal- Thin Teaspoon -- Pharyngeal -- Pharyngeal- Thin Cup Delayed swallow initiation-vallecula;Delayed swallow initiation-pyriform sinuses Pharyngeal -- Pharyngeal- Thin Straw Pharyngeal residue - valleculae Pharyngeal -- Pharyngeal- Puree WFL Pharyngeal -- Pharyngeal- Mechanical Soft -- Pharyngeal -- Pharyngeal- Regular Delayed swallow initiation-vallecula;Pharyngeal residue - valleculae Pharyngeal -- Pharyngeal- Multi-consistency -- Pharyngeal -- Pharyngeal- Pill Reduced pharyngeal peristalsis Pharyngeal -- Pharyngeal Comment --  CHL IP CERVICAL ESOPHAGEAL PHASE 10/18/2021 Cervical Esophageal Phase WFL Pudding Teaspoon -- Pudding Cup -- Honey Teaspoon -- Honey Cup -- Nectar Teaspoon -- Nectar Cup -- Nectar Straw -- Thin Teaspoon -- Thin Cup -- Thin Straw -- Puree -- Mechanical Soft -- Regular -- Multi-consistency -- Pill -- Cervical Esophageal Comment -- Sonia Baller, MA, CCC-SLP Speech Therapy              Scheduled Meds:  aspirin EC  81 mg Oral Daily   azithromycin  500 mg Oral Daily   budesonide  0.5 mg Nebulization BID   calcium carbonate  1,500 mg Oral Daily   Chlorhexidine Gluconate Cloth  6 each Topical Q0600   donepezil  2.5 mg Oral QHS   enoxaparin (LOVENOX) injection  40 mg Subcutaneous Q24H   insulin aspart  0-15 Units Subcutaneous TID AC & HS   ipratropium-albuterol  3 mL Nebulization BID   lactulose  10 g Oral Daily   methylPREDNISolone (SOLU-MEDROL) injection  40 mg Intravenous Q12H   metoCLOPramide  5 mg Oral BID   mupirocin  ointment  1 application Nasal BID   nystatin  1 application Topical BID   polyvinyl alcohol  2 drop Both Eyes TID   senna  2  tablet Oral QHS   Continuous Infusions:  cefTRIAXone (ROCEPHIN)  IV 2 g (10/18/21 0050)     LOS: 2 days   Marylu Lund, MD [facility] Pager On Amion  If 7PM-7AM, please contact night-coverage 10/18/2021, 4:31 PM

## 2021-10-18 NOTE — Progress Notes (Signed)
OT Cancellation Note  Patient Details Name: [name] MRN: [id number] DOB: 06-05-1941   Cancelled Treatment:    Reason Eval/Treat Not Completed: OT screened, no needs identified, will sign off patient is total care at baseline with long term care placement at [facility]. Per discussion with nursing patient is at her baseline with TD for all ADL tasks. Recommend patient continue LTC at SNF at time of d/c.   [name] OTR/L, [facility] [facility] Department Office# [phone] Pager# [phone]    10/18/2021, 9:56 AM

## 2021-10-19 LAB — BLOOD GAS, ARTERIAL
Acid-Base Excess: 6.6 mmol/L — ABNORMAL HIGH (ref 0.0–2.0)
Bicarbonate: 32.6 mmol/L — ABNORMAL HIGH (ref 20.0–28.0)
Drawn by: 25770
FIO2: 28
O2 Content: 2 L/min
O2 Saturation: 96.8 %
Patient temperature: 98.6
pCO2 arterial: 56.4 mmHg — ABNORMAL HIGH (ref 32.0–48.0)
pH, Arterial: 7.38 (ref 7.350–7.450)
pO2, Arterial: 91.3 mmHg (ref 83.0–108.0)

## 2021-10-19 LAB — GLUCOSE, CAPILLARY
Glucose-Capillary: 185 mg/dL — ABNORMAL HIGH (ref 70–99)
Glucose-Capillary: 173 mg/dL — ABNORMAL HIGH (ref 70–99)
Glucose-Capillary: 149 mg/dL — ABNORMAL HIGH (ref 70–99)
Glucose-Capillary: 289 mg/dL — ABNORMAL HIGH (ref 70–99)

## 2021-10-19 NOTE — Care Management Important Message (Signed)
Important Message  Patient Details IM Letter placed in Patients room for son Crista [place] Name: TYNEKA SCAFIDI MRN: [id number] Date of Birth: 07-16-1941   Medicare Important Message Given:  Yes     Kerin Salen 10/19/2021, 9:36 AM

## 2021-10-19 NOTE — Progress Notes (Signed)
  The patient appears to be drowsy this am. Able to arouse patient, alert, oriented to self only. VS remain stable.  Oxygen saturation on room air at rest 82-88%. @ lt [place] reapplied, saturation increased 96 %. Provider is aware. Will continue to monitor.   10/19/21 1112  Vitals  Temp 97.7 F (36.5 C)  Temp Source Oral  BP 111/74  MAP (mmHg) 86  BP Location Left Arm  BP Method Automatic  Patient Position (if appropriate) Lying  Pulse Rate 63  Pulse Rate Source Monitor  Resp 18  MEWS COLOR  MEWS Score Color Green  Oxygen Therapy  SpO2 (!) 88 % (notified rn)  O2 Device Room Air  MEWS Score  MEWS Temp 0  MEWS Systolic 0  MEWS Pulse 0  MEWS RR 0  MEWS LOC 1  MEWS Score 1

## 2021-10-19 NOTE — Progress Notes (Signed)
PROGRESS NOTE    [name]  QBH:[id number] DOB: [birth year] DOA: 10/16/2021 PCP: [name], DO    Brief Narrative:  80 year old female with past medical history of ITP (2019), dementia, chronic atrial fibrillation (not on full dose anticoagulation), diastolic congestive heart failure (Echo 2016 EF 60-65%), COPD, remote history of right breast cancer, insulin-dependent diabetes mellitus type 2 and chronic respiratory failure (on 2lpm via [place]) who presents to [facility] long hospital emergency department from [facility] with progressively worsening shortness of breath and cough. Pt found to have PNA with concerns of COPD exacerbation  Assessment & Plan:   Principal Problem:   Sepsis due to pneumonia [facility]) Active Problems:   COPD with acute exacerbation ([place])   Chronic atrial fibrillation ([place])   Type 2 diabetes mellitus with hyperglycemia, with long-term current use of insulin (HCC)   Dementia without behavioral disturbance (HCC)   Goals of care, counseling/discussion   Aspiration pneumonia of right lower lobe due to gastric secretions (HCC)   Acute on chronic respiratory failure with hypoxia and hypercapnia (HCC)   Acute metabolic encephalopathy    * Sepsis due to pneumonia [facility]) Patient presenting with right lower lobe pneumonia as an infectious source with evidence of concurrent organ dysfunction as evidenced by acute hypoxic and hypercapnic respiratory failure all suggesting concurrent sepsis Improved on ceftriaxone and azithromycin Blood cultures neg thus far    Acute on chronic respiratory failure with hypoxia and hypercapnia ([place]) Patient suffering from acute hypercapnic and respiratory failure that is multifactorial in etiology due to substantial right lower lobe pneumonia in the setting of COPD exacerbation Presenting VBG revealing pH of 7.25 with PCO2 of 80 Initially much improved after bipap Cont abx per above Weaned to [facility] this AM Pt noted to be more lethargic and  tired appearing today. Will repeat ABG   Aspiration pneumonia of right lower lobe due to gastric secretions [facility]) Patient has a known history of recurrent aspiration pneumonia due to history of advanced dementia Pt improving with current regimen SLP eval and MBS reviewed. Recs for dysphagia 3   Acute metabolic encephalopathy Noted to be lethargic initially, improved with bipap Suspect hypercarbic encephalopathy vs toxic metabolic encephalopathy related to PNA Family reports baseline is with pt being non-verbal through very verbal. Seems at baseline now This AM, appears more lethargic. Repeat ABG pending per above   COPD with acute exacerbation (HCC) Currently off bipap No audible wheezing, however, pt appears more lethargic, ABG pending   Dementia without behavioral disturbance (HCC) Noted to have dementia for approximately 8 years Minimizing uncomfortable stimuli as much as possible Cont fall precautions PT/OT recs to return to long term care when stable for d/c   Chronic atrial fibrillation [facility]) Patient presenting with atrial fibrillation with rapid ventricular response HR remains rate controlled Patient is typically not on full dose anticoagulation per her outpatient regimen Cont to monitor on tele      Goals of care, counseling/discussion Patient had been confirmed to be DNR/DNI however noninvasive positive pressure ventilation is okay Pt's son noted to be okay with other modalities of care including intravenous fluids and antibiotics, breathing treatments and steroids    DVT prophylaxis: Lovenox subq Code Status: DNR Family Communication: Pt in room, family currently not at bedside  Status is: Inpatient  Remains inpatient appropriate because: severity of illness   Consultants:    Procedures:    Antimicrobials: Anti-infectives (From admission, onward)    Start     Dose/Rate Route Frequency Ordered Stop  10/18/21 2200  azithromycin (ZITHROMAX) tablet 500 mg         500 mg Oral Daily 10/18/21 1325 10/20/21 2159   10/17/21 0100  cefTRIAXone (ROCEPHIN) 2 g in sodium chloride 0.9 % 100 mL IVPB        2 g 200 mL/hr over 30 Minutes Intravenous Every 24 hours 10/16/21 0557 10/22/21 0059   10/17/21 0100  azithromycin (ZITHROMAX) 500 mg in sodium chloride 0.9 % 250 mL IVPB  Status:  Discontinued        500 mg 250 mL/hr over 60 Minutes Intravenous Every 24 hours 10/16/21 0557 10/18/21 1325   10/16/21 0145  cefTRIAXone (ROCEPHIN) 2 g in sodium chloride 0.9 % 100 mL IVPB        2 g 200 mL/hr over 30 Minutes Intravenous  Once 10/16/21 0144 10/16/21 0259   10/16/21 0145  azithromycin (ZITHROMAX) 500 mg in sodium chloride 0.9 % 250 mL IVPB        500 mg 250 mL/hr over 60 Minutes Intravenous  Once 10/16/21 0144 10/16/21 0400       Subjective: Cannot assess given current mentation  Objective: Vitals:   10/19/21 0531 10/19/21 0839 10/19/21 1112 10/19/21 1115  BP: 123/78  111/74   Pulse: 91  63   Resp: 18  18   Temp: 97.6 F (36.4 C)  97.7 F (36.5 C)   TempSrc: Oral  Oral   SpO2: 99% 97% (!) 88% 96%  Weight:      Height:        Intake/Output Summary (Last 24 hours) at 10/19/2021 1510 Last data filed at 10/19/2021 1037 Gross per 24 hour  Intake 960 ml  Output 1300 ml  Net -340 ml    Filed Weights   10/16/21 1841 10/16/21 2100  Weight: 90 kg 84.1 kg    Examination: General exam: Very tired appearing, in no acute distress Respiratory system: normal chest rise, clear, no audible wheezing Cardiovascular system: regular rhythm, s1-s2 Gastrointestinal system: Nondistended, nontender, pos BS Central nervous system: No seizures, no tremors Extremities: No cyanosis, no joint deformities Skin: No rashes, no pallor Psychiatry: Unable to assess given mentation  Data Reviewed: I have personally reviewed following labs and imaging studies  CBC: Recent Labs  Lab 10/16/21 0042 10/17/21 0418 10/18/21 0350  WBC 19.4* 8.4 10.5  NEUTROABS 16.5*  7.8*  --   HGB 11.2* 10.1* 9.8*  HCT 36.6 33.5* 32.1*  MCV 82.1 82.9 80.9  PLT 310 244 329    Basic Metabolic Panel: Recent Labs  Lab 10/16/21 0042 10/16/21 2159 10/17/21 0418 10/18/21 0350  NA 138  --  141 134*  K 3.5  --  4.5 4.4  CL 98  --  103 98  CO2 33*  --  30 27  GLUCOSE 179*  --  162* 270*  BUN 34*  --  36* 47*  CREATININE 0.79  --  0.60 0.76  CALCIUM 8.3*  --  8.3* 8.1*  MG  --  1.8 1.9  --     GFR: Estimated Creatinine Clearance: 49.8 mL/min (by C-G formula based on SCr of 0.76 mg/dL). Liver Function Tests: Recent Labs  Lab 10/16/21 0042 10/17/21 0418 10/18/21 0350  AST 14* 13* 16  ALT 10 7 11   ALKPHOS 82 62 69  BILITOT 1.0 0.7 0.3  PROT 6.7 6.4* 6.1*  ALBUMIN 3.0* 2.6* 2.5*    No results for input(s): LIPASE, AMYLASE in the last 168 hours. No results for input(s): AMMONIA in the  last 168 hours. Coagulation Profile: Recent Labs  Lab 10/16/21 0042  INR 1.2    Cardiac Enzymes: No results for input(s): CKTOTAL, CKMB, CKMBINDEX, TROPONINI in the last 168 hours. BNP (last 3 results) No results for input(s): PROBNP in the last 8760 hours. HbA1C: Recent Labs    10/16/21 2159  HGBA1C 5.7*    CBG: Recent Labs  Lab 10/18/21 1134 10/18/21 1639 10/18/21 2031 10/19/21 0710 10/19/21 1110  GLUCAP 259* 174* 237* 185* 173*    Lipid Profile: No results for input(s): CHOL, HDL, LDLCALC, TRIG, CHOLHDL, LDLDIRECT in the last 72 hours. Thyroid Function Tests: No results for input(s): TSH, T4TOTAL, FREET4, T3FREE, THYROIDAB in the last 72 hours. Anemia Panel: No results for input(s): VITAMINB12, FOLATE, FERRITIN, TIBC, IRON, RETICCTPCT in the last 72 hours. Sepsis Labs: Recent Labs  Lab 10/16/21 0042 10/16/21 0242 10/16/21 2159  PROCALCITON  --   --  0.45  LATICACIDVEN 0.9 0.9  --      Recent Results (from the past 240 hour(s))  Resp Panel by RT-PCR (Flu A&B, Covid) Nasopharyngeal Swab     Status: None   Collection Time: 10/16/21 12:33 AM    Specimen: Nasopharyngeal Swab; Nasopharyngeal(NP) swabs in vial transport medium  Result Value Ref Range Status   SARS Coronavirus 2 by RT PCR NEGATIVE NEGATIVE Final    Comment: (NOTE) SARS-CoV-2 target nucleic acids are NOT DETECTED.  The SARS-CoV-2 RNA is generally detectable in upper respiratory specimens during the acute phase of infection. The lowest concentration of SARS-CoV-2 viral copies this assay can detect is 138 copies/mL. A negative result does not preclude SARS-Cov-2 infection and should not be used as the sole basis for treatment or other patient management decisions. A negative result may occur with  improper specimen collection/handling, submission of specimen other than nasopharyngeal swab, presence of viral mutation(s) within the areas targeted by this assay, and inadequate number of viral copies(<138 copies/mL). A negative result must be combined with clinical observations, patient history, and epidemiological information. The expected result is Negative.  Fact Sheet for Patients:  EntrepreneurPulse.com.au  Fact Sheet for Healthcare Providers:  IncredibleEmployment.be  This test is no t yet approved or cleared by the [place] FDA and  has been authorized for detection and/or diagnosis of SARS-CoV-2 by FDA under an Emergency Use Authorization (EUA). This EUA will remain  in effect (meaning this test can be used) for the duration of the COVID-19 declaration under Section 564(b)(1) of the Act, 21 U.S.C.section 360bbb-3(b)(1), unless the authorization is terminated  or revoked sooner.       Influenza A by PCR NEGATIVE NEGATIVE Final   Influenza B by PCR NEGATIVE NEGATIVE Final    Comment: (NOTE) The Xpert Xpress SARS-CoV-2/FLU/RSV plus assay is intended as an aid in the diagnosis of influenza from Nasopharyngeal swab specimens and should not be used as a sole basis for treatment. Nasal washings and aspirates are  unacceptable for Xpert Xpress SARS-CoV-2/FLU/RSV testing.  Fact Sheet for Patients: EntrepreneurPulse.com.au  Fact Sheet for Healthcare Providers: IncredibleEmployment.be  This test is not yet approved or cleared by the [place] FDA and has been authorized for detection and/or diagnosis of SARS-CoV-2 by FDA under an Emergency Use Authorization (EUA). This EUA will remain in effect (meaning this test can be used) for the duration of the COVID-19 declaration under Section 564(b)(1) of the Act, 21 U.S.C. section 360bbb-3(b)(1), unless the authorization is terminated or revoked.  Performed at [facility], [place] [address], [place], [place] [address]  Blood Culture (routine x 2)     Status: None (Preliminary result)   Collection Time: 10/16/21 12:42 AM   Specimen: BLOOD  Result Value Ref Range Status   Specimen Description   Final    BLOOD BLOOD RIGHT HAND Performed at [place] [address], [place], [place] [address]    Special Requests   Final    BOTTLES DRAWN AEROBIC AND ANAEROBIC Blood Culture adequate volume Performed at [place] [address], [place], [place] [address]    Culture   Final    NO GROWTH 3 DAYS Performed at [facility], [place] [address] [place] [address], [place], [place] [address]    Report Status PENDING  Incomplete  Urine Culture     Status: None   Collection Time: 10/16/21 12:42 AM   Specimen: In/Out Cath Urine  Result Value Ref Range Status   Specimen Description   Final    IN/OUT CATH URINE Performed at [place] [address], [place], [place] [address]    Special Requests   Final    NONE Performed at [facility], [place] [address], [place], [place] [address]    Culture   Final    NO GROWTH Performed at [facility], [place] [address] [place] [address], [place], [place] [address]    Report Status 10/17/2021 FINAL  Final   Blood Culture (routine x 2)     Status: None (Preliminary result)   Collection Time: 10/16/21 12:47 AM   Specimen: BLOOD  Result Value Ref Range Status   Specimen Description   Final    BLOOD BLOOD LEFT FOREARM Performed at [place] [address] [place], [place] [address]    Special Requests   Final    BOTTLES DRAWN AEROBIC AND ANAEROBIC Blood Culture adequate volume Performed at [place] [address], [place], [place] [address]    Culture   Final    NO GROWTH 3 DAYS Performed at [facility], [place] [address] [place], [place] [address]    Report Status PENDING  Incomplete  MRSA Next Gen by PCR, Nasal     Status: Abnormal   Collection Time: 10/17/21  1:30 PM   Specimen: Nasal Mucosa; Nasal Swab  Result Value Ref Range Status   MRSA by PCR Next Gen DETECTED (A) NOT DETECTED Final    Comment: RESULT CALLED TO, READ BACK BY AND VERIFIED WITH: TICKET, S. ON 10/17/2021 @ 1450 BY MECIAL J. (NOTE) The GeneXpert MRSA Assay (FDA approved for NASAL specimens only), is one component of a comprehensive MRSA colonization surveillance program. It is not intended to diagnose MRSA infection nor to guide or monitor treatment for MRSA infections. Test performance is not FDA approved in patients less than 46 years old. Performed at [facility], [place] [address], [place], [place] [address]       Radiology Studies: DG Chest 1 View  Result Date: 10/17/2021 CLINICAL DATA:  Cough and possible aspiration EXAM: PORTABLE CHEST 1 VIEW COMPARISON:  10/16/2021 FINDINGS: Cardiac shadow is enlarged. Increased vascular congestion is noted with mild interstitial edema. No focal infiltrate or sizable effusion is noted. No pneumothorax is seen. No bony abnormality is noted. IMPRESSION: Increase in vascular congestion with mild interstitial edema. Electronically Signed   By: Inez Catalina M.D.   On: 10/17/2021 21:46   DG Swallowing  Func-Speech Pathology  Result Date: 10/18/2021 Table formatting from the original result was  not included. Objective Swallowing Evaluation: Type of Study: MBS-Modified Barium Swallow Study  Patient Details Name: [name] MRN: [id number] Date of Birth: 11-28-1941 Today's Date: 10/18/2021 Time: SLP Start Time (ACUTE ONLY): 23 -SLP Stop Time (ACUTE ONLY): 1250 SLP Time Calculation (min) (ACUTE ONLY): 20 min Past Medical History: Past Medical History: Diagnosis Date  Acute ITP ([place])   Anemia   Asthma   Atrial fibrillation, persistent ([place]) 11/01/2013  Breast cancer of lower-inner quadrant of right female breast ([place]) 10/17/2011  Breast cancer, stage 1 ([place]) 10/17/2011  s/p right lumpectomy and XRT  Bronchitis   Bruises easily   Chills   Chronic anticoagulation, on Eliquis 11/01/2013  Chronic bronchitis (HCC)   Chronic diastolic CHF (congestive heart failure) ([place])   Constipation   COPD (chronic obstructive pulmonary disease) ([place])   Cough   Dementia without behavioral disturbance ([place])   Dyslipidemia   Fracture of left lower leg 06/1972  "no surgery; just casted it"  Generalized weakness   Gout attack   ankle, then wrist and hands  Gout without tophus   Hernia   History of nuclear stress test 02/09/2010  dipyridamole; normal pattern of perfusion in all regions with attenuation artifact in inferior region, low risk   Hypertension   Insomnia   Morbid obesity ([place])   On home oxygen therapy   "2L prn" (01/24/2014)  OSA on CPAP   Pneumonia 2012; 2014  Protein calorie malnutrition ([place])   Sarcoid   Sarcoidosis   Type II diabetes mellitus ([place])   Uterine cancer ([place])   s/p hysterectomy  Wheezing  Past Surgical History: Past Surgical History: Procedure Laterality Date  ABDOMINAL HYSTERECTOMY  02/04/2005  BREAST BIOPSY Right 01/2011  BREAST LUMPECTOMY Right 01/2011  FLEXIBLE SIGMOIDOSCOPY N/A 04/16/2018  Procedure: FLEXIBLE SIGMOIDOSCOPY;  Surgeon: Otis Brace, MD;  Location: [facility] ENDOSCOPY;  Service: Gastroenterology;   Laterality: N/A;  No sedation   HERNIA REPAIR    TOTAL KNEE ARTHROPLASTY Bilateral 03/2010; 06/2011  left; right  TRANSTHORACIC ECHOCARDIOGRAM  07/2008  EF, LV size is normal; RVSP normal; mod calcif of MV apparatus  TUBAL LIGATION  2947'M  UMBILICAL HERNIA REPAIR  02/04/2005 HPI: Patient is an 80 y.o. female with PMH: ITP (2019), dementia, chronic atrial fibrillation (not on full dose anticoagulation), diastolic congestive heart failure (Echo 2016 EF 60-65%), COPD, remote history of right breast cancer, insulin-dependent diabetes mellitus type 2 and chronic respiratory failure (on 2lpm via [place]) who presents to [facility] long hospital emergency department from [facility] with progressively worsening shortness of breath and cough. Pt found to have PNA with concerns of COPD exacerbation.  Subjective: pleasant, alert, talking to herself Assessment / Plan / Recommendation CHL IP CLINICAL IMPRESSIONS 10/18/2021 Clinical Impression Patient presents with a mild oropharyngeal dysphagia as per this MBS. Her dentures were in place and appeared to be well-fitting. During oral phase of swallow, patient exhibited mild anterior to posterior transit delay with puree solids and with regular texture solids, she exhibited delay in mastication, anterior to posterior oral transit and piecemeal swallowing of bolus. She was able to transit barium tablet with sips of thin liquid barium without significant delay. During pharyngeal phase of swallow, patient exhibited delay approximately in between vallecular and pyriform sinuses with thin liquids but aspiration events and a couple questionable flash penetration events (difficult to fully view as patient's shoulder was blocking some of view). Only trace amount of vallecular residuals observed which cleared with subsequent swallows. Barium tablet became briefly lodged in vallecular sinus but  then transited fully with second sip of thin liquid barium. Esophageal sweep did not reveal any delays in  bolus transit or retention of barium. SLP is recommending Dys 3 solids, thin liquids diet. SLP will follow briefly while hospitalized and recommending f/u from SLP at [facility]. SLP Visit Diagnosis -- Attention and concentration deficit following -- Frontal lobe and executive function deficit following -- Impact on safety and function --   CHL IP TREATMENT RECOMMENDATION 10/18/2021 Treatment Recommendations Therapy as outlined in treatment plan below   Prognosis 10/18/2021 Prognosis for Safe Diet Advancement Good Barriers to Reach Goals Cognitive deficits Barriers/Prognosis Comment -- CHL IP DIET RECOMMENDATION 10/18/2021 SLP Diet Recommendations Dysphagia 3 (Mech soft) solids;Thin liquid Liquid Administration via Cup;Straw Medication Administration Whole meds with liquid Compensations Minimize environmental distractions;Slow rate;Small sips/bites Postural Changes Seated upright at 90 degrees   CHL IP OTHER RECOMMENDATIONS 10/18/2021 Recommended Consults -- Oral Care Recommendations Oral care BID;Staff/trained caregiver to provide oral care Other Recommendations --   CHL IP FOLLOW UP RECOMMENDATIONS 10/18/2021 Follow up Recommendations 24 hour supervision/assistance;Skilled Nursing facility   [facility] IP FREQUENCY AND DURATION 10/18/2021 Speech Therapy Frequency (ACUTE ONLY) min 1 x/week Treatment Duration 1 week      CHL IP ORAL PHASE 10/18/2021 Oral Phase -- Oral - Pudding Teaspoon -- Oral - Pudding Cup -- Oral - Honey Teaspoon -- Oral - Honey Cup -- Oral - Nectar Teaspoon -- Oral - Nectar Cup -- Oral - Nectar Straw -- Oral - Thin Teaspoon -- Oral - Thin Cup WFL Oral - Thin Straw WFL Oral - Puree Reduced posterior propulsion;Weak lingual manipulation;Delayed oral transit Oral - Mech Soft -- Oral - Regular Weak lingual manipulation;Piecemeal swallowing;Reduced posterior propulsion;Impaired mastication;Delayed oral transit Oral - Multi-Consistency -- Oral - Pill Decreased bolus cohesion Oral Phase - Comment --  CHL IP PHARYNGEAL  PHASE 10/18/2021 Pharyngeal Phase -- Pharyngeal- Pudding Teaspoon -- Pharyngeal -- Pharyngeal- Pudding Cup -- Pharyngeal -- Pharyngeal- Honey Teaspoon -- Pharyngeal -- Pharyngeal- Honey Cup -- Pharyngeal -- Pharyngeal- Nectar Teaspoon -- Pharyngeal -- Pharyngeal- Nectar Cup -- Pharyngeal -- Pharyngeal- Nectar Straw -- Pharyngeal -- Pharyngeal- Thin Teaspoon -- Pharyngeal -- Pharyngeal- Thin Cup Delayed swallow initiation-vallecula;Delayed swallow initiation-pyriform sinuses Pharyngeal -- Pharyngeal- Thin Straw Pharyngeal residue - valleculae Pharyngeal -- Pharyngeal- Puree WFL Pharyngeal -- Pharyngeal- Mechanical Soft -- Pharyngeal -- Pharyngeal- Regular Delayed swallow initiation-vallecula;Pharyngeal residue - valleculae Pharyngeal -- Pharyngeal- Multi-consistency -- Pharyngeal -- Pharyngeal- Pill Reduced pharyngeal peristalsis Pharyngeal -- Pharyngeal Comment --  CHL IP CERVICAL ESOPHAGEAL PHASE 10/18/2021 Cervical Esophageal Phase WFL Pudding Teaspoon -- Pudding Cup -- Honey Teaspoon -- Honey Cup -- Nectar Teaspoon -- Nectar Cup -- Nectar Straw -- Thin Teaspoon -- Thin Cup -- Thin Straw -- Puree -- Mechanical Soft -- Regular -- Multi-consistency -- Pill -- Cervical Esophageal Comment -- Sonia Baller, MA, CCC-SLP Speech Therapy              Scheduled Meds:  aspirin EC  81 mg Oral Daily   azithromycin  500 mg Oral Daily   budesonide  0.5 mg Nebulization BID   calcium carbonate  1,500 mg Oral Daily   Chlorhexidine Gluconate Cloth  6 each Topical Q0600   donepezil  2.5 mg Oral QHS   enoxaparin (LOVENOX) injection  40 mg Subcutaneous Q24H   insulin aspart  0-15 Units Subcutaneous TID AC & HS   ipratropium-albuterol  3 mL Nebulization BID   lactulose  10 g Oral Daily   methylPREDNISolone (SOLU-MEDROL) injection  40 mg Intravenous Daily  metoCLOPramide  5 mg Oral BID   mupirocin ointment  1 application Nasal BID   nystatin  1 application Topical BID   polyvinyl alcohol  2 drop Both Eyes TID   senna   2 tablet Oral QHS   Continuous Infusions:  cefTRIAXone (ROCEPHIN)  IV 2 g (10/19/21 0033)     LOS: 3 days   Marylu Lund, MD [facility] Pager On Amion  If 7PM-7AM, please contact night-coverage 10/19/2021, 3:10 PM

## 2021-10-19 NOTE — Progress Notes (Signed)
Notified Lab that ABG being sent for analysis. 

## 2021-10-20 LAB — COMPREHENSIVE METABOLIC PANEL
ALT: 11 U/L (ref 0–44)
AST: 17 U/L (ref 15–41)
Albumin: 2.4 g/dL — ABNORMAL LOW (ref 3.5–5.0)
Alkaline Phosphatase: 55 U/L (ref 38–126)
Anion gap: 7 (ref 5–15)
BUN: 40 mg/dL — ABNORMAL HIGH (ref 8–23)
CO2: 30 mmol/L (ref 22–32)
Calcium: 8.1 mg/dL — ABNORMAL LOW (ref 8.9–10.3)
Chloride: 101 mmol/L (ref 98–111)
Creatinine, Ser: 0.7 mg/dL (ref 0.44–1.00)
GFR, Estimated: >60 mL/min (ref >60)
Glucose, Bld: 220 mg/dL — ABNORMAL HIGH (ref 70–99)
Potassium: 4.7 mmol/L (ref 3.5–5.1)
Sodium: 138 mmol/L (ref 135–145)
Total Bilirubin: 0.2 mg/dL — ABNORMAL LOW (ref 0.3–1.2)
Total Protein: 5.3 g/dL — ABNORMAL LOW (ref 6.5–8.1)

## 2021-10-20 LAB — CBC
HCT: 31.8 % — ABNORMAL LOW (ref 36.0–46.0)
Hemoglobin: 9.7 g/dL — ABNORMAL LOW (ref 12.0–15.0)
MCH: 24.9 pg — ABNORMAL LOW (ref 26.0–34.0)
MCHC: 30.5 g/dL (ref 30.0–36.0)
MCV: 81.5 fL (ref 80.0–100.0)
Platelets: 287 10*3/uL (ref 150–400)
RBC: 3.9 MIL/uL (ref 3.87–5.11)
RDW: 15.5 % (ref 11.5–15.5)
WBC: 10 10*3/uL (ref 4.0–10.5)
nRBC: 0 % (ref 0.0–0.2)

## 2021-10-20 LAB — GLUCOSE, CAPILLARY
Glucose-Capillary: 173 mg/dL — ABNORMAL HIGH (ref 70–99)
Glucose-Capillary: 230 mg/dL — ABNORMAL HIGH (ref 70–99)
Glucose-Capillary: 245 mg/dL — ABNORMAL HIGH (ref 70–99)
Glucose-Capillary: 272 mg/dL — ABNORMAL HIGH (ref 70–99)

## 2021-10-20 MED ORDER — LORATADINE 10 MG PO TABS
10.0000 mg | ORAL_TABLET | Freq: Every day | ORAL | Status: DC
  Administered 2021-10-20 – 2021-10-21 (×2): 10 mg via ORAL
  Filled 2021-10-20 (×2): qty 1

## 2021-10-20 MED ORDER — PANTOPRAZOLE SODIUM 40 MG PO TBEC
40.0000 mg | DELAYED_RELEASE_TABLET | Freq: Every day | ORAL | Status: DC
  Administered 2021-10-20 – 2021-10-21 (×2): 40 mg via ORAL
  Filled 2021-10-20 (×2): qty 1

## 2021-10-20 MED ORDER — FLUTICASONE PROPIONATE 50 MCG/ACT NA SUSP
2.0000 | Freq: Every day | NASAL | Status: DC
  Administered 2021-10-20 – 2021-10-21 (×2): 2 via NASAL
  Filled 2021-10-20: qty 16

## 2021-10-20 NOTE — NC FL2 (Signed)
[place] LEVEL OF CARE SCREENING TOOL     IDENTIFICATION  Patient Name: [name] Birthdate: 07-07-1941 Sex: female Admission Date (Current Location): 10/16/2021  [facility] and [place] Number:  Herbalist and Address:  [facility],  [place], [place]      Provider Number: [id number]  Attending Physician Name and Address:  Eugenie Filler, MD  Relative Name and Phone Number:  Jeon,[name] Son [phone]  [phone] Son [phone]  [phone]    Current Level of Care: Hospital Recommended Level of Care: [place] Prior Approval Number:    Date Approved/Denied:   PASRR Number: [id number]  Discharge Plan: SNF    Current Diagnoses: Patient Active Problem List   Diagnosis Date Noted   Aspiration pneumonia of right lower lobe due to gastric secretions ([place]) 10/16/2021   Sepsis due to pneumonia ([place]) 10/16/2021   Acute on chronic respiratory failure with hypoxia and hypercapnia ([place]) 51/88/4166   Acute metabolic encephalopathy 06/10/1600   Contusion of trunk 05/04/2018   Chronic ITP (idiopathic thrombocytopenia) ([place]) 04/20/2018   Memory loss 04/20/2018   Palliative care by specialist    Goals of care, counseling/discussion    Diverticular hemorrhage 03/18/2018   Thrombocytopenia ([place]) 03/18/2018   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   CKD (chronic kidney disease) stage 2, GFR 60-89 ml/min 07/12/2016   Hypertensive heart disease with heart failure ([place]) 05/17/2016   Type 2 diabetes mellitus with hyperglycemia, with long-term current use of insulin ([place]) 05/17/2016   Dementia without behavioral disturbance ([place]) 05/17/2016   Hypotension 10/05/2015   Osteoporosis 10/02/2014   Vertigo 04/19/2014   Pre-syncope 04/17/2014   Pulmonary hypertension- PA pressure 46 mmHg 01/29/2014   Hypokalemia 01/27/2014   Hypertension 11/27/2013   Constipation 11/27/2013   Chronic  diastolic CHF (congestive heart failure) ([place]) 11/18/2013   Bradycardia 11/01/2013   Chronic atrial fibrillation (HCC) 11/01/2013   Chronic anticoagulation, on Eliquis 11/01/2013   COPD with acute exacerbation ([place]) 08/19/2013   Chronic respiratory failure with hypoxia ([place]) 08/19/2013   Weakness generalized 08/19/2013   Lung nodule seen on imaging study 08/18/2013   Nausea and vomiting 08/18/2013   Lower GI bleed 05/18/2013   Hyperlipidemia 05/03/2013   COPD exacerbation ([place]) 11/20/2012   Gout 11/17/2012   Hypocalcemia 11/17/2012   Upper respiratory infection 12/17/2011   Breast cancer of lower-inner quadrant of right female breast ([place]) 10/17/2011   Obesity hypoventilation syndrome ([place]) 09/24/2011   Peripheral edema 09/24/2011   Anemia 06/01/2011   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   Bronchitis 05/27/2008   Obstructive sleep apnea 05/27/2008    Orientation RESPIRATION BLADDER Height & Weight     Self  O2 (O2 2L [place]) Incontinent Weight: 84.1 kg Height:  4\' 10"  (147.3 cm)  BEHAVIORAL SYMPTOMS/MOOD NEUROLOGICAL BOWEL NUTRITION STATUS      Incontinent Diet (soft)  AMBULATORY STATUS COMMUNICATION OF NEEDS Skin   Extensive Assist Verbally Other (Comment) (Ecchymosis over body, leftt and right,arms, abdomen,  buttocks.)                       Personal Care Assistance Level of Assistance  Bathing, Feeding, Dressing Bathing Assistance: Maximum assistance Feeding assistance: Maximum assistance Dressing Assistance: Maximum assistance     Functional Limitations Info  Sight, Hearing, Speech Sight Info: Adequate Hearing Info: Adequate Speech Info: Impaired    SPECIAL CARE FACTORS FREQUENCY  PT (By licensed PT), OT (By licensed OT)     PT  Frequency: Eval and treat OT Frequency: Eval and treat            Contractures Contractures Info: Not present    Additional Factors Info  Code Status, Allergies, Insulin Sliding Scale Code Status Info: DNR Allergies Info:  Contrast Media (Iodinated Diagnostic Agents), Pravachol   Insulin Sliding Scale Info: See discharge summary for Insulin dose       Current Medications (10/20/2021):  This is the current hospital active medication list Current Facility-Administered Medications  Medication Dose Route Frequency Provider Last Rate Last Admin   acetaminophen (TYLENOL) tablet 650 mg  650 mg Oral Q6H PRN Shalhoub, Sherryll Burger, MD       Or   acetaminophen (TYLENOL) suppository 650 mg  650 mg Rectal Q6H PRN Shalhoub, Sherryll Burger, MD       aspirin EC tablet 81 mg  81 mg Oral Daily Shalhoub, Sherryll Burger, MD   81 mg at 10/20/21 1014   budesonide (PULMICORT) nebulizer solution 0.5 mg  0.5 mg Nebulization BID Vernelle Emerald, MD   0.5 mg at 10/20/21 0859   calcium carbonate (TUMS - dosed in mg elemental calcium) chewable tablet 1,500 mg  1,500 mg Oral Daily Shalhoub, Sherryll Burger, MD   1,500 mg at 10/19/21 0912   cefTRIAXone (ROCEPHIN) 2 g in sodium chloride 0.9 % 100 mL IVPB  2 g Intravenous Q24H Shalhoub, Sherryll Burger, MD   Stopped at 10/20/21 0114   Chlorhexidine Gluconate Cloth 2 % PADS 6 each  6 each Topical Q0600 Donne Hazel, MD   6 each at 10/20/21 0549   donepezil (ARICEPT) tablet 2.5 mg  2.5 mg Oral QHS Shalhoub, Sherryll Burger, MD   2.5 mg at 10/19/21 2228   enoxaparin (LOVENOX) injection 40 mg  40 mg Subcutaneous Q24H Vernelle Emerald, MD   40 mg at 10/20/21 0549   fluticasone (FLONASE) 50 MCG/ACT nasal spray 2 spray  2 spray Each Nare Daily Eugenie Filler, MD   2 spray at 10/20/21 1408   insulin aspart (novoLOG) injection 0-15 Units  0-15 Units Subcutaneous TID AC & HS Shalhoub, Sherryll Burger, MD   5 Units at 10/20/21 1408   ipratropium-albuterol (DUONEB) 0.5-2.5 (3) MG/3ML nebulizer solution 3 mL  3 mL Nebulization Q4H PRN Shalhoub, Sherryll Burger, MD       ipratropium-albuterol (DUONEB) 0.5-2.5 (3) MG/3ML nebulizer solution 3 mL  3 mL Nebulization BID Donne Hazel, MD   3 mL at 10/20/21 0859   lactulose (CHRONULAC) 10 GM/15ML  solution 10 g  10 g Oral Daily Vernelle Emerald, MD   10 g at 10/20/21 1014   loratadine (CLARITIN) tablet 10 mg  10 mg Oral Daily Eugenie Filler, MD   10 mg at 10/20/21 1014   methylPREDNISolone sodium succinate (SOLU-MEDROL) 40 mg/mL injection 40 mg  40 mg Intravenous Daily Donne Hazel, MD   40 mg at 10/20/21 1015   metoCLOPramide (REGLAN) tablet 5 mg  5 mg Oral BID Vernelle Emerald, MD   5 mg at 10/20/21 1014   mupirocin ointment (BACTROBAN) 2 % 1 application  1 application Nasal BID Donne Hazel, MD   1 application at 96/04/54 1015   nystatin (MYCOSTATIN/NYSTOP) topical powder 1 application  1 application Topical BID Shalhoub, Sherryll Burger, MD   1 application at 09/81/19 1015   ondansetron (ZOFRAN) tablet 4 mg  4 mg Oral Q6H PRN Shalhoub, Sherryll Burger, MD       Or   ondansetron [facility])  injection 4 mg  4 mg Intravenous Q6H PRN Shalhoub, Sherryll Burger, MD       pantoprazole (PROTONIX) EC tablet 40 mg  40 mg Oral Q0600 Eugenie Filler, MD   40 mg at 10/20/21 1015   polyethylene glycol (MIRALAX / GLYCOLAX) packet 17 g  17 g Oral Daily PRN Shalhoub, Sherryll Burger, MD       polyvinyl alcohol (LIQUIFILM TEARS) 1.4 % ophthalmic solution 2 drop  2 drop Both Eyes TID Vernelle Emerald, MD   2 drop at 10/20/21 1015   senna (SENOKOT) tablet 17.2 mg  2 tablet Oral QHS Vernelle Emerald, MD   17.2 mg at 10/19/21 2229     Discharge Medications: Please see discharge summary for a list of discharge medications.  Relevant Imaging Results:  Relevant Lab Results:   Additional Information VW#[id number]  Purcell Mouton, RN

## 2021-10-20 NOTE — TOC Progression Note (Signed)
Transition of Care [facility]) - Progression Note    Patient Details  Name: [name] MRN: [id number] Date of Birth: [birth year]  Transition of Care [facility]) CM/SW Contact  Purcell Mouton, RN Phone Number: 10/20/2021, 3:04 PM  Clinical Narrative:    Pt is from [facility] LT and plan to return when medically stable. Will need a COVID test 24 hrs before returning to [facility].    Expected Discharge Plan: [place] Barriers to Discharge: No Barriers Identified  Expected Discharge Plan and Services Expected Discharge Plan: [place] arrangements for the past 2 months: [facility]                                       Social Determinants of Health (SDOH) Interventions    Readmission Risk Interventions No flowsheet data found.

## 2021-10-20 NOTE — Progress Notes (Signed)
PROGRESS NOTE    [name]  DJS:[id number] DOB: [birth year] DOA: 10/16/2021 PCP: [name], DO   Chief Complaint  Patient presents with   Altered Mental Status   Cough    Brief Narrative:  80 year old female with past medical history of ITP (2019), dementia, chronic atrial fibrillation (not on full dose anticoagulation), diastolic congestive heart failure (Echo 2016 EF 60-65%), COPD, remote history of right breast cancer, insulin-dependent diabetes mellitus type 2 and chronic respiratory failure (on 2lpm via [place]) who presents to [facility] long hospital emergency department from [facility] with progressively worsening shortness of breath and cough. Pt found to have PNA with concerns of COPD exacerbation   Assessment & Plan:   Principal Problem:   Sepsis due to pneumonia [facility]) Active Problems:   COPD with acute exacerbation ([place])   Chronic atrial fibrillation ([place])   Type 2 diabetes mellitus with hyperglycemia, with long-term current use of insulin (HCC)   Dementia without behavioral disturbance (HCC)   Goals of care, counseling/discussion   Aspiration pneumonia of right lower lobe due to gastric secretions (HCC)   Acute on chronic respiratory failure with hypoxia and hypercapnia (HCC)   Acute metabolic encephalopathy  #1 sepsis secondary to pneumonia -Patient presented with right lower lobe pneumonia with evidence of concurrent organ dysfunction with acute hypoxia and hypercapnic respiratory failure concurrent with sepsis. -Blood cultures with no growth to date. -MRSA PCR positive. -COVID-19 PCR negative, influenza A and B-. -Patient improving clinically. -Continue empiric IV Rocephin and if continued improvement could likely transition to oral antibiotics tomorrow. -Status post full course of azithromycin. -Follow.  2.  Acute on chronic respiratory failure with hypoxia and hypercapnia -Patient presenting with acute hypercapnic respiratory failure multifactorial felt  secondary to right lower lobe pneumonia in the setting of COPD exacerbation. -VBG on admission with a pH of 7.25, PCO2 of 80. -Clinical improvement after BiPAP. -O2 being weaned. -Patient more alert. -Continue empiric IV antibiotics.  3.  Right lower lobe aspiration pneumonia -Patient with history of recurrent aspiration pneumonia due to history of advanced dementia. -Patient seen by SLP and MBS done. -Continue current dysphagia 3 diet.  4.  Acute metabolic encephalopathy -Patient initially noted to be lethargic which improved on BiPAP. -Likely hypercarbic encephalopathy versus toxic metabolic encephalopathy secondary to pneumonia. -Per family patient at baseline sometimes nonverbal and sometimes verbal. -Close to baseline.  5.  Acute COPD exacerbation -Currently off BiPAP. -Clinical improvement. -Continue antibiotics, Pulmicort, Flonase, scheduled duo nebs, on IV steroids could likely transition the next 24 hours-48 hours to prednisone taper.  Continue PPI.  6. dementia -Patient noted to have a history of dementia for approximately 8 years. -Continue Aricept. -Fall precautions. -Seen by PT/OT recommending return to long-term care when medically stable.  7.  Chronic A. fib -On presentation patient noted to be in A. fib with RVR. -Rate currently controlled. -Noted to typically not be on full dose anticoagulation per outpatient regimen.  8.  Goals of care -Patient currently DNR/DNI however noninvasive positive pressure ventilation okay. -It is noted that patient's son okay with other modalities of care including IV fluids, antibiotics, breathing treatments, steroids.    DVT prophylaxis: Lovenox Code Status: DNR Family Communication: No family at bedside. Disposition:   Status is: Inpatient  Remains inpatient appropriate because: Severity of illness       Consultants:  None  Procedures:  Chest x-ray 10/16/2021, 10/17/2021  Antimicrobials:  Oral azithromycin  10/18/2021>>>> 10/19/2021 IV azithromycin 10/17/2021>>>> 10/18/2021 IV Rocephin 10/16/2021>>>> 10/22/2021  Subjective: Patient sleeping but arousable.  Alert.  Nonverbal today.  Per RN sometimes patient is verbal and sometimes nonverbal.  Tolerating diet when fed.  Objective: Vitals:   10/19/21 2050 10/19/21 2100 10/20/21 0436 10/20/21 1204  BP:  102/64 123/82 105/71  Pulse:  76 74 60  Resp:  18 20 20   Temp:  98.2 F (36.8 C) 97.9 F (36.6 C) 98.3 F (36.8 C)  TempSrc:  Oral  Axillary  SpO2: 98% 100% 98% 98%  Weight:      Height:        Intake/Output Summary (Last 24 hours) at 10/20/2021 1337 Last data filed at 10/20/2021 1243 Gross per 24 hour  Intake 1380 ml  Output 1400 ml  Net -20 ml   Filed Weights   10/16/21 1841 10/16/21 2100  Weight: 90 kg 84.1 kg    Examination:  General exam: Appears calm and comfortable  Respiratory system: Clear to auscultation anterior lung fields.Marland Kitchen Respiratory effort normal. Cardiovascular system: S1 & S2 heard, RRR. No JVD, murmurs, rubs, gallops or clicks. No pedal edema. Gastrointestinal system: Abdomen is nondistended, soft and nontender. No organomegaly or masses felt. Normal bowel sounds heard. Central nervous system: Alert. No focal neurological deficits. Extremities: Symmetric 5 x 5 power. Skin: No rashes, lesions or ulcers Psychiatry: Judgement and insight appear poor.  Mood & affect appropriate.     Data Reviewed: I have personally reviewed following labs and imaging studies  CBC: Recent Labs  Lab 10/16/21 0042 10/17/21 0418 10/18/21 0350 10/20/21 0401  WBC 19.4* 8.4 10.5 10.0  NEUTROABS 16.5* 7.8*  --   --   HGB 11.2* 10.1* 9.8* 9.7*  HCT 36.6 33.5* 32.1* 31.8*  MCV 82.1 82.9 80.9 81.5  PLT 310 244 306 789    Basic Metabolic Panel: Recent Labs  Lab 10/16/21 0042 10/16/21 2159 10/17/21 0418 10/18/21 0350 10/20/21 0401  NA 138  --  141 134* 138  K 3.5  --  4.5 4.4 4.7  CL 98  --  103 98 101  CO2 33*  --   30 27 30   GLUCOSE 179*  --  162* 270* 220*  BUN 34*  --  36* 47* 40*  CREATININE 0.79  --  0.60 0.76 0.70  CALCIUM 8.3*  --  8.3* 8.1* 8.1*  MG  --  1.8 1.9  --   --     GFR: Estimated Creatinine Clearance: 49.8 mL/min (by C-G formula based on SCr of 0.7 mg/dL).  Liver Function Tests: Recent Labs  Lab 10/16/21 0042 10/17/21 0418 10/18/21 0350 10/20/21 0401  AST 14* 13* 16 17  ALT 10 7 11 11   ALKPHOS 82 62 69 55  BILITOT 1.0 0.7 0.3 0.2*  PROT 6.7 6.4* 6.1* 5.3*  ALBUMIN 3.0* 2.6* 2.5* 2.4*    CBG: Recent Labs  Lab 10/19/21 1110 10/19/21 1632 10/19/21 2124 10/20/21 0715 10/20/21 1201  GLUCAP 173* 149* 289* 173* 230*     Recent Results (from the past 240 hour(s))  Resp Panel by RT-PCR (Flu A&B, Covid) Nasopharyngeal Swab     Status: None   Collection Time: 10/16/21 12:33 AM   Specimen: Nasopharyngeal Swab; Nasopharyngeal(NP) swabs in vial transport medium  Result Value Ref Range Status   SARS Coronavirus 2 by RT PCR NEGATIVE NEGATIVE Final    Comment: (NOTE) SARS-CoV-2 target nucleic acids are NOT DETECTED.  The SARS-CoV-2 RNA is generally detectable in upper respiratory specimens during the acute phase of infection. The lowest concentration of SARS-CoV-2 viral copies  this assay can detect is 138 copies/mL. A negative result does not preclude SARS-Cov-2 infection and should not be used as the sole basis for treatment or other patient management decisions. A negative result may occur with  improper specimen collection/handling, submission of specimen other than nasopharyngeal swab, presence of viral mutation(s) within the areas targeted by this assay, and inadequate number of viral copies(<138 copies/mL). A negative result must be combined with clinical observations, patient history, and epidemiological information. The expected result is Negative.  Fact Sheet for Patients:  EntrepreneurPulse.com.au  Fact Sheet for Healthcare Providers:   IncredibleEmployment.be  This test is no t yet approved or cleared by the [place] FDA and  has been authorized for detection and/or diagnosis of SARS-CoV-2 by FDA under an Emergency Use Authorization (EUA). This EUA will remain  in effect (meaning this test can be used) for the duration of the COVID-19 declaration under Section 564(b)(1) of the Act, 21 U.S.C.section 360bbb-3(b)(1), unless the authorization is terminated  or revoked sooner.       Influenza A by PCR NEGATIVE NEGATIVE Final   Influenza B by PCR NEGATIVE NEGATIVE Final    Comment: (NOTE) The Xpert Xpress SARS-CoV-2/FLU/RSV plus assay is intended as an aid in the diagnosis of influenza from Nasopharyngeal swab specimens and should not be used as a sole basis for treatment. Nasal washings and aspirates are unacceptable for Xpert Xpress SARS-CoV-2/FLU/RSV testing.  Fact Sheet for Patients: EntrepreneurPulse.com.au  Fact Sheet for Healthcare Providers: IncredibleEmployment.be  This test is not yet approved or cleared by the [place] FDA and has been authorized for detection and/or diagnosis of SARS-CoV-2 by FDA under an Emergency Use Authorization (EUA). This EUA will remain in effect (meaning this test can be used) for the duration of the COVID-19 declaration under Section 564(b)(1) of the Act, 21 U.S.C. section 360bbb-3(b)(1), unless the authorization is terminated or revoked.  Performed at [facility], [place] [address], [place], [place] [address]   Blood Culture (routine x 2)     Status: None (Preliminary result)   Collection Time: 10/16/21 12:42 AM   Specimen: BLOOD  Result Value Ref Range Status   Specimen Description   Final    BLOOD BLOOD RIGHT HAND Performed at [place] [address] [place], [place] [address]    Special Requests   Final    BOTTLES DRAWN AEROBIC AND ANAEROBIC Blood Culture  adequate volume Performed at [place] [address], [place], [place] [address]    Culture   Final    NO GROWTH 4 DAYS Performed at [facility], [place] [address] [place], [place] [address]    Report Status PENDING  Incomplete  Urine Culture     Status: None   Collection Time: 10/16/21 12:42 AM   Specimen: In/Out Cath Urine  Result Value Ref Range Status   Specimen Description   Final    IN/OUT CATH URINE Performed at [place] [address] [place], [place] [address]    Special Requests   Final    NONE Performed at [facility], [place] [address], [place], [place] [address]    Culture   Final    NO GROWTH Performed at [facility], [place] [address] [place], [place] [address]    Report Status 10/17/2021 FINAL  Final  Blood Culture (routine x 2)     Status: None (Preliminary result)   Collection Time: 10/16/21 12:47 AM  Specimen: BLOOD  Result Value Ref Range Status   Specimen Description   Final    BLOOD BLOOD LEFT FOREARM Performed at [place] [address] [place], [place] [address]    Special Requests   Final    BOTTLES DRAWN AEROBIC AND ANAEROBIC Blood Culture adequate volume Performed at [place] [address] [place], [place] [address]    Culture   Final    NO GROWTH 4 DAYS Performed at [facility], [place] [address] [place], [place] [address]    Report Status PENDING  Incomplete  MRSA Next Gen by PCR, Nasal     Status: Abnormal   Collection Time: 10/17/21  1:30 PM   Specimen: Nasal Mucosa; Nasal Swab  Result Value Ref Range Status   MRSA by PCR Next Gen DETECTED (A) NOT DETECTED Final    Comment: RESULT CALLED TO, READ BACK BY AND VERIFIED WITH: TICKET, S. ON 10/17/2021 @ 1450 BY MECIAL J. (NOTE) The GeneXpert MRSA Assay (FDA approved for NASAL specimens only), is one component of a comprehensive MRSA colonization  surveillance program. It is not intended to diagnose MRSA infection nor to guide or monitor treatment for MRSA infections. Test performance is not FDA approved in patients less than 6 years old. Performed at [facility], [place] [address], [place], [place] [address]          Radiology Studies: No results found.      Scheduled Meds:  aspirin EC  81 mg Oral Daily   budesonide  0.5 mg Nebulization BID   calcium carbonate  1,500 mg Oral Daily   Chlorhexidine Gluconate Cloth  6 each Topical Q0600   donepezil  2.5 mg Oral QHS   enoxaparin (LOVENOX) injection  40 mg Subcutaneous Q24H   fluticasone  2 spray Each Nare Daily   insulin aspart  0-15 Units Subcutaneous TID AC & HS   ipratropium-albuterol  3 mL Nebulization BID   lactulose  10 g Oral Daily   loratadine  10 mg Oral Daily   methylPREDNISolone (SOLU-MEDROL) injection  40 mg Intravenous Daily   metoCLOPramide  5 mg Oral BID   mupirocin ointment  1 application Nasal BID   nystatin  1 application Topical BID   pantoprazole  40 mg Oral Q0600   polyvinyl alcohol  2 drop Both Eyes TID   senna  2 tablet Oral QHS   Continuous Infusions:  cefTRIAXone (ROCEPHIN)  IV Stopped (10/20/21 0114)     LOS: 4 days    Time spent: 35 minutes    Irine Seal, MD [facility]   To contact the attending provider between 7A-7P or the covering provider during after hours 7P-7A, please log into the web site [email or web address] and access using universal [place] password for that web site. If you do not have the password, please call the hospital operator.  10/20/2021, 1:37 PM

## 2021-10-21 LAB — BASIC METABOLIC PANEL
Anion gap: 5 (ref 5–15)
BUN: 33 mg/dL — ABNORMAL HIGH (ref 8–23)
CO2: 34 mmol/L — ABNORMAL HIGH (ref 22–32)
Calcium: 8.3 mg/dL — ABNORMAL LOW (ref 8.9–10.3)
Chloride: 99 mmol/L (ref 98–111)
Creatinine, Ser: 0.8 mg/dL (ref 0.44–1.00)
GFR, Estimated: >60 mL/min (ref >60)
Glucose, Bld: 194 mg/dL — ABNORMAL HIGH (ref 70–99)
Potassium: 4.7 mmol/L (ref 3.5–5.1)
Sodium: 138 mmol/L (ref 135–145)

## 2021-10-21 LAB — CBC WITH DIFFERENTIAL/PLATELET
Abs Immature Granulocytes: 1.6 10*3/uL — ABNORMAL HIGH (ref 0.00–0.07)
Basophils Absolute: 0.1 10*3/uL (ref 0.0–0.1)
Basophils Relative: 1 %
Eosinophils Absolute: 0 10*3/uL (ref 0.0–0.5)
Eosinophils Relative: 0 %
HCT: 35 % — ABNORMAL LOW (ref 36.0–46.0)
Hemoglobin: 10.5 g/dL — ABNORMAL LOW (ref 12.0–15.0)
Immature Granulocytes: 12 %
Lymphocytes Relative: 8 %
Lymphs Abs: 1 10*3/uL (ref 0.7–4.0)
MCH: 24.8 pg — ABNORMAL LOW (ref 26.0–34.0)
MCHC: 30 g/dL (ref 30.0–36.0)
MCV: 82.7 fL (ref 80.0–100.0)
Monocytes Absolute: 0.7 10*3/uL (ref 0.1–1.0)
Monocytes Relative: 5 %
Neutro Abs: 9.7 10*3/uL — ABNORMAL HIGH (ref 1.7–7.7)
Neutrophils Relative %: 74 %
Platelets: 293 10*3/uL (ref 150–400)
RBC: 4.23 MIL/uL (ref 3.87–5.11)
RDW: 15.4 % (ref 11.5–15.5)
WBC: 13.1 10*3/uL — ABNORMAL HIGH (ref 4.0–10.5)
nRBC: 0 % (ref 0.0–0.2)

## 2021-10-21 LAB — GLUCOSE, CAPILLARY
Glucose-Capillary: 195 mg/dL — ABNORMAL HIGH (ref 70–99)
Glucose-Capillary: 293 mg/dL — ABNORMAL HIGH (ref 70–99)
Glucose-Capillary: 385 mg/dL — ABNORMAL HIGH (ref 70–99)
Glucose-Capillary: 308 mg/dL — ABNORMAL HIGH (ref 70–99)

## 2021-10-21 LAB — RESP PANEL BY RT-PCR (FLU A&B, COVID) ARPGX2
Influenza A by PCR: NEGATIVE
Influenza B by PCR: NEGATIVE
SARS Coronavirus 2 by RT PCR: NEGATIVE

## 2021-10-21 LAB — CULTURE, BLOOD (ROUTINE X 2)
Culture: NO GROWTH
Culture: NO GROWTH
Special Requests: ADEQUATE
Special Requests: ADEQUATE

## 2021-10-21 LAB — MAGNESIUM: Magnesium: 2 mg/dL (ref 1.7–2.4)

## 2021-10-21 MED ORDER — FLUTICASONE PROPIONATE 50 MCG/ACT NA SUSP: 2.0000 | Freq: Every day | NASAL | 2 refills | Status: AC

## 2021-10-21 MED ORDER — CEFDINIR 300 MG PO CAPS: 300.0000 mg | ORAL_CAPSULE | Freq: Two times a day (BID) | ORAL | 0 refills | Status: AC

## 2021-10-21 MED ORDER — PREDNISONE 20 MG PO TABS: 40.0000 mg | ORAL_TABLET | Freq: Every day | ORAL | Status: DC

## 2021-10-21 MED ORDER — PANTOPRAZOLE SODIUM 40 MG PO TBEC: 40.0000 mg | DELAYED_RELEASE_TABLET | Freq: Every day | ORAL | 1 refills | Status: AC

## 2021-10-21 MED ORDER — PREDNISONE 20 MG PO TABS: ORAL_TABLET | ORAL | 0 refills | Status: AC

## 2021-10-21 MED ORDER — LORATADINE 10 MG PO TABS: 10.0000 mg | ORAL_TABLET | Freq: Every day | ORAL | Status: AC

## 2021-10-21 MED ORDER — CEFDINIR 300 MG PO CAPS
300.0000 mg | ORAL_CAPSULE | Freq: Two times a day (BID) | ORAL | Status: DC
  Administered 2021-10-21 (×2): 300 mg via ORAL
  Filled 2021-10-21 (×2): qty 1

## 2021-10-21 NOTE — Plan of Care (Signed)
  Problem: Clinical Measurements: Goal: Respiratory complications will improve Outcome: Adequate for Discharge   Problem: Activity: Goal: Risk for activity intolerance will decrease Outcome: Adequate for Discharge   Problem: Nutrition: Goal: Adequate nutrition will be maintained Outcome: Adequate for Discharge   Problem: Elimination: Goal: Will not experience complications related to bowel motility Outcome: Adequate for Discharge   Problem: Clinical Measurements: Goal: Respiratory complications will improve Outcome: Adequate for Discharge

## 2021-10-21 NOTE — Progress Notes (Signed)
Inpatient Diabetes Program Recommendations  AACE/ADA: New Consensus Statement on Inpatient Glycemic Control (2015)  Target Ranges:  Prepandial:   less than 140 mg/dL      Peak postprandial:   less than 180 mg/dL (1-2 hours)      Critically ill patients:  140 - 180 mg/dL   Lab Results  Component Value Date   GLUCAP 293 (H) 10/21/2021   HGBA1C 5.7 (H) 10/16/2021    Review of Glycemic Control  Diabetes history: DM2 Outpatient Diabetes medications:  Current orders for Inpatient glycemic control: Novolog 0-15 units TID with meals and 0-5 HS  Steroids decreasing to Pred 40 mg QD Post-prandials elevated.  Inpatient Diabetes Program Recommendations:    Add Novolog 2 units TID with meals if eating > 50%  Continue to follow.  Thank you. Lorenda Peck, RD, LDN, CDE Inpatient Diabetes Coordinator [phone]

## 2021-10-21 NOTE — Progress Notes (Signed)
Spoke to Therapist, sports for report at [facility]. All questions answered. Pt waiting for [facility] transport.

## 2021-10-21 NOTE — Progress Notes (Signed)
Speech Language Pathology Treatment: Dysphagia  Patient Details Name: [name] MRN: [id number] DOB: 10/02/1941 Today's Date: 10/21/2021 Time: 0750-0825 SLP Time Calculation (min) (ACUTE ONLY): 35 min  Assessment / Plan / Recommendation Clinical Impression  Pt seen to address dysphagia goals, assure po tolerance today after her MBS a few days prior.   After oral care *dentures removed and cleaned by SLP- pt brushing her tongue, gums*, pt as provided with a "snack" of icecream, graham crackers and water.  Pt able to feed herself easily with set up- No indication of airway compromise nor oral retention.  RR did slightly elevate to low 30s with eating - thus recommend pt continue Dys3/thin for energy conservation while in hospital and advanced  to regular diet at dc.  SLP educated pt to importance of oral care *pt has dementia* and posted swallow precaution signs including need for pt to remove and clean dentures nightly as well as brushing her oral cavity. Pt reports she does take out her dentures at night.  Pt has made great progress in swallowing - and no SLP follow up needed.    HPI HPI: Patient is an 80 y.o. female with PMH: ITP (2019), dementia, chronic atrial fibrillation (not on full dose anticoagulation), diastolic congestive heart failure (Echo 2016 EF 60-65%), COPD, remote history of right breast cancer, insulin-dependent diabetes mellitus type 2 and chronic respiratory failure (on 2lpm via [place]) who presents to [facility] emergency department from [place] with progressively worsening shortness of breath and cough. Pt found to have PNA with concerns of COPD exacerbation.      SLP Plan  All goals met      Recommendations for follow up therapy are one component of a multi-disciplinary discharge planning process, led by the attending physician.  Recommendations may be updated based on patient status, additional functional criteria and insurance authorization.     Recommendations  Diet recommendations: Dysphagia 3 (mechanical soft);Thin liquid (reg/thin at dc) Liquids provided via: Cup;Straw Medication Administration: Whole meds with puree Supervision: Patient able to self feed Compensations: Minimize environmental distractions;Slow rate;Small sips/bites                Oral Care Recommendations: Oral care BID;Other (Comment) (informed RN of denture care) Follow Up Recommendations: No SLP follow up SLP Visit Diagnosis: Dysphagia, unspecified (R13.10) Plan: All goals met       GO                ,  Ann.slp   10/21/2021, 11:03 AM 

## 2021-10-21 NOTE — TOC Progression Note (Signed)
Transition of Care [facility]) - Progression Note    Patient Details  Name: [name] MRN: [id number] Date of Birth: Oct 24, 1941  Transition of Care [facility]) CM/SW Contact  Purcell Mouton, RN Phone Number: 10/21/2021, 3:19 PM  Clinical Narrative:    [name] was called for transport to [facility]. RN aware.    Expected Discharge Plan: [place] Barriers to Discharge: No Barriers Identified  Expected Discharge Plan and Services Expected Discharge Plan: [place] arrangements for the past 2 months: [place] Expected Discharge Date: 10/21/21                                     Social Determinants of Health (SDOH) Interventions    Readmission Risk Interventions No flowsheet data found.

## 2021-10-21 NOTE — TOC Progression Note (Signed)
Transition of Care [facility]) - Progression Note    Patient Details  Name: [name] MRN: [id number] Date of Birth: 1941-06-25  [facility]) CM/SW Contact  Purcell Mouton, RN Phone Number: 10/21/2021, 1:16 PM  Clinical Narrative:    Pt may return to [facility]. Rm [id number].    Expected Discharge Plan: [place] Barriers to Discharge: No Barriers Identified  Expected Discharge Plan and Services Expected Discharge Plan: [place] arrangements for the past 2 months: [place]                                       Social Determinants of Health (SDOH) Interventions    Readmission Risk Interventions No flowsheet data found.

## 2021-10-21 NOTE — Discharge Summary (Signed)
Physician Discharge Summary  [name] ZDG:[id number] DOB: 06-21-1941 DOA: 10/16/2021  PCP: [name], DO  Admit date: 10/16/2021 Discharge date: 10/21/2021  Time spent: 60 minutes  Recommendations for Outpatient Follow-up:  Follow-up with MD at SNF.  Patient will need a basic metabolic profile done in 1 week to follow-up on electrolytes and renal function.   Discharge Diagnoses:  Principal Problem:   Sepsis due to pneumonia [facility]) Active Problems:   COPD with acute exacerbation ([place])   Chronic atrial fibrillation ([place])   Type 2 diabetes mellitus with hyperglycemia, with long-term current use of insulin (HCC)   Dementia without behavioral disturbance (HCC)   Goals of care, counseling/discussion   Aspiration pneumonia of right lower lobe due to gastric secretions (HCC)   Acute on chronic respiratory failure with hypoxia ([place])   Acute metabolic encephalopathy   Discharge Condition: Stable and improved  Diet recommendation: Dysphagia 3 diet/heart healthy  Filed Weights   10/16/21 1841 10/16/21 2100  Weight: 90 kg 84.1 kg    History of present illness:  HPI per Dr. Cyd Silence 80 year old female with past medical history of ITP (2019), dementia, chronic atrial fibrillation (not on full dose anticoagulation), diastolic congestive heart failure (Echo 2016 EF 60-65%), COPD, remote history of right breast cancer, insulin-dependent diabetes mellitus type 2 and chronic respiratory failure (on 2lpm via [place]) who presented to [facility] emergency department from [facility] with progressively worsening shortness of breath and cough.   Patient is an extremely poor historian due to advanced dementia and the majority the history is obtained from the daughter who is at the bedside as well as discussions with the emergency department staff.   According to the daughter, patient has been experiencing progressive cough over the past several days.  This is been associated with some  shortness of breath as of late.  In the past 24 hours patient has additionally been exhibiting associated confusion and lethargy beyond her usual baseline mentation.  Due to persisting shortness of breath cough and confusion EMS was contacted and the patient was brought to [facility] emergency department for evaluation.   Upon evaluation in the emergency department patient was found to exhibit multiple SIRS criteria including tachycardia tachypnea and leukocytosis.  Patient was also found to be suffering from acute  respiratory failure and likely sepsis.  Patient was initiated on intravenous ceftriaxone and azithromycin for suspected right lower lobe pneumonia seen on chest x-ray.  Patient was additionally administered 1 L of lactated Ringer's solution. The hospitalist group was called to assess the patient for admission the hospital.   Hospital Course:  #1 sepsis secondary to pneumonia -Patient presented with right lower lobe pneumonia with evidence of concurrent organ dysfunction with acute hypoxia and hypercapnic respiratory failure concurrent with sepsis. -Blood cultures with no growth to date. -MRSA PCR positive. -COVID-19 PCR negative, influenza A and B negative. -Patient was placed on IV antibiotics, scheduled nebulizers.. -Patient completed course of azithromycin. -IV Rocephin and subsequently transition to [facility] and patient was discharged on 2 more days of oral Vantin to complete a course of antibiotic treatment. -Patient improved clinically, remained afebrile, was back to baseline by day of discharge.  2.  Acute on chronic respiratory failure with hypoxia and hypercapnia -Patient presented with acute hypercapnic respiratory failure multifactorial felt secondary to right lower lobe pneumonia in the setting of COPD exacerbation. -VBG on admission with a pH of 7.25, PCO2 of 80. -Patient initially placed on BiPAP with clinical improvement.   -Patient  maintained on a steroid taper,  scheduled nebs, antibiotics, and improved clinically, oxygen was weaned back to home regimen of 2 L nasal cannula.  -Patient will be discharged on a steroid taper as well as 2 days of antibiotics.  3.  Right lower lobe aspiration pneumonia -Patient with history of recurrent aspiration pneumonia due to history of advanced dementia. -Patient seen by SLP and MBS done. -Patient maintained on dysphagia 3 diet which patient be discharged on.   4.  Acute metabolic encephalopathy -Patient initially noted to be lethargic which improved on BiPAP. -Likely hypercarbic encephalopathy versus toxic metabolic encephalopathy secondary to pneumonia. -Per family patient at baseline sometimes nonverbal and sometimes verbal. -Patient was close to baseline by day of discharge  5.  Acute COPD exacerbation -Patient initially placed on BiPAP with clinical improvement.   -Patient subsequently weaned off BiPAP back to home regimen 2 L O2.  -Patient placed on antibiotics, Pulmicort, Flonase, scheduled duo nebs, on IV steroids which were subsequently transition to oral steroids.  Patient also maintained on a PPI.   -Patient improved clinically and will be discharged on a steroid taper as well as 2 more days of antibiotics.   -Outpatient follow-up with MD at SNF.   6. dementia -Patient noted to have a history of dementia for approximately 8 years. -Patient maintained on home regimen Aricept. -Fall precautions. -Seen by PT/OT recommending return to long-term care when medically stable. -Patient with discharge back to SNF  7.  Chronic A. fib -On presentation patient noted to be in A. fib with RVR. -Rate remained controlled. -Noted to typically not be on full dose anticoagulation per outpatient regimen.  8.  Goals of care -Patient currently DNR/DNI however noninvasive positive pressure ventilation okay. -It is noted that patient's son okay with other modalities of care including IV fluids, antibiotics, breathing  treatments, steroids.      Procedures: Chest x-ray 10/16/2021, 10/17/2021  Consultations: None  Discharge Exam: Vitals:   10/21/21 0848 10/21/21 1357  BP:  135/77  Pulse:  96  Resp:  18  Temp:  97.6 F (36.4 C)  SpO2: 97% 98%    General: NAD Cardiovascular: Regular rate rhythm no murmurs rubs or gallops.  No JVD.  No lower extremity edema. Respiratory: Clear to auscultation bilaterally anterior lung fields.  Discharge Instructions   Discharge Instructions     Diet - low sodium heart healthy   Complete by: As directed    dYSPHAGIA 3 DIET   Increase activity slowly   Complete by: As directed       Allergies as of 10/21/2021       Reactions   Contrast Media [iodinated Diagnostic Agents] Nausea And Vomiting   Pravachol Other (See Comments)   Muscle pain        Medication List     TAKE these medications    aspirin EC 81 MG tablet Take 81 mg by mouth daily.   budesonide 0.5 MG/2ML nebulizer solution Commonly known as: PULMICORT Take 0.5 mg by nebulization 2 (two) times daily.   calcium carbonate 750 MG chewable tablet Commonly known as: TUMS EX Chew 2 tablets by mouth daily.   cefdinir 300 MG capsule Commonly known as: OMNICEF Take 1 capsule (300 mg total) by mouth every 12 (twelve) hours for 2 days.   Dextran 70-Hypromellose (PF) 0.1-0.3 % Soln Apply 2 drops to eye in the morning, at noon, and at bedtime.   donepezil 5 MG tablet Commonly known as: ARICEPT Take 2.5 mg by mouth  at bedtime.   fluticasone 50 MCG/ACT nasal spray Commonly known as: FLONASE Place 2 sprays into both nostrils daily. Start taking on: October 22, 2021   furosemide 20 MG tablet Commonly known as: LASIX Take 0.5 tablets (10 mg total) by mouth daily. What changed: how much to take   ipratropium-albuterol 0.5-2.5 (3) MG/3ML Soln Commonly known as: DUONEB Take 3 mLs by nebulization daily.   ipratropium-albuterol 0.5-2.5 (3) MG/3ML Soln Commonly known as:  DUONEB Take 3 mLs by nebulization every 6 (six) hours as needed (WHEEZES OR SHORTNESS OF BREATH).   lactulose 10 GM/15ML solution Commonly known as: CHRONULAC Take by mouth daily. Give 15 mL by mouth   loratadine 10 MG tablet Commonly known as: CLARITIN Take 1 tablet (10 mg total) by mouth daily. Start taking on: October 22, 2021   metoCLOPramide 5 MG tablet Commonly known as: REGLAN Take 5 mg by mouth 2 (two) times daily.   nystatin powder Generic drug: nystatin Apply 1 application topically 2 (two) times daily. Apply powder topically to rash two times daily after wash skin folds under breast, and groins for 14 days   pantoprazole 40 MG tablet Commonly known as: PROTONIX Take 1 tablet (40 mg total) by mouth daily at 6 (six) AM. Start taking on: October 22, 2021   predniSONE 20 MG tablet Commonly known as: DELTASONE Take 2 tablets (40 mg total) by mouth daily before breakfast for 3 days, THEN 1 tablet (20 mg total) daily before breakfast for 3 days. Start taking on: October 22, 2021 What changed: See the new instructions.   PRO-STAT PO Take 30 mLs by mouth in the morning and at bedtime.   senna 8.6 MG Tabs tablet Commonly known as: SENOKOT Take 2 tablets by mouth in the morning and at bedtime.   SYSTANE COMPLETE OP Place 1 drop into both eyes 3 (three) times daily.   zinc oxide 20 % ointment Apply 1 application topically as needed for irritation.       Allergies  Allergen Reactions   Contrast Media [Iodinated Diagnostic Agents] Nausea And Vomiting   Pravachol Other (See Comments)    Muscle pain    Contact information for follow-up providers     MD AT SNF Follow up.               Contact information for after-discharge care     Destination     [facility]erred [facility] .   Service: Skilled Nursing Contact information: [place] [phone]                      The results of  significant diagnostics from this hospitalization (including imaging, microbiology, ancillary and laboratory) are listed below for reference.    Significant Diagnostic Studies: DG Chest 1 View  Result Date: 10/17/2021 CLINICAL DATA:  Cough and possible aspiration EXAM: PORTABLE CHEST 1 VIEW COMPARISON:  10/16/2021 FINDINGS: Cardiac shadow is enlarged. Increased vascular congestion is noted with mild interstitial edema. No focal infiltrate or sizable effusion is noted. No pneumothorax is seen. No bony abnormality is noted. IMPRESSION: Increase in vascular congestion with mild interstitial edema. Electronically Signed   By: Inez Catalina M.D.   On: 10/17/2021 21:46   DG Chest Port 1 View  Result Date: 10/16/2021 CLINICAL DATA:  Sepsis EXAM: PORTABLE CHEST 1 VIEW COMPARISON:  03/18/2018 FINDINGS: Abnormal right basilar opacities. Cardiomediastinal contours are normal. No pleural effusion or pneumothorax. IMPRESSION: Right basilar opacities, likely pneumonia  Electronically Signed   By: Ulyses Jarred M.D.   On: 10/16/2021 01:35   DG Swallowing Func-Speech Pathology  Result Date: 10/18/2021 Table formatting from the original result was not included. Objective Swallowing Evaluation: Type of Study: MBS-Modified Barium Swallow Study  Patient Details Name: [name] MRN: [id number] Date of Birth: 25-Jan-1941 Today's Date: 10/18/2021 Time: SLP Start Time (ACUTE ONLY): 52 -SLP Stop Time (ACUTE ONLY): 1250 SLP Time Calculation (min) (ACUTE ONLY): 20 min Past Medical History: Past Medical History: Diagnosis Date  Acute ITP ([place])   Anemia   Asthma   Atrial fibrillation, persistent ([place]) 11/01/2013  Breast cancer of lower-inner quadrant of right female breast ([place]) 10/17/2011  Breast cancer, stage 1 ([place]) 10/17/2011  s/p right lumpectomy and XRT  Bronchitis   Bruises easily   Chills   Chronic anticoagulation, on Eliquis 11/01/2013  Chronic bronchitis (HCC)   Chronic diastolic CHF (congestive heart failure) ([place])    Constipation   COPD (chronic obstructive pulmonary disease) ([place])   Cough   Dementia without behavioral disturbance ([place])   Dyslipidemia   Fracture of left lower leg 06/1972  "no surgery; just casted it"  Generalized weakness   Gout attack   ankle, then wrist and hands  Gout without tophus   Hernia   History of nuclear stress test 02/09/2010  dipyridamole; normal pattern of perfusion in all regions with attenuation artifact in inferior region, low risk   Hypertension   Insomnia   Morbid obesity ([place])   On home oxygen therapy   "2L prn" (01/24/2014)  OSA on CPAP   Pneumonia 2012; 2014  Protein calorie malnutrition ([place])   Sarcoid   Sarcoidosis   Type II diabetes mellitus ([place])   Uterine cancer ([place])   s/p hysterectomy  Wheezing  Past Surgical History: Past Surgical History: Procedure Laterality Date  ABDOMINAL HYSTERECTOMY  02/04/2005  BREAST BIOPSY Right 01/2011  BREAST LUMPECTOMY Right 01/2011  FLEXIBLE SIGMOIDOSCOPY N/A 04/16/2018  Procedure: FLEXIBLE SIGMOIDOSCOPY;  Surgeon: Otis Brace, MD;  Location: [facility];  Service: Gastroenterology;  Laterality: N/A;  No sedation   HERNIA REPAIR    TOTAL KNEE ARTHROPLASTY Bilateral 03/2010; 06/2011  left; right  TRANSTHORACIC ECHOCARDIOGRAM  07/2008  EF, LV size is normal; RVSP normal; mod calcif of MV apparatus  TUBAL LIGATION  6433'I  UMBILICAL HERNIA REPAIR  02/04/2005 HPI: Patient is an 80 y.o. female with PMH: ITP (2019), dementia, chronic atrial fibrillation (not on full dose anticoagulation), diastolic congestive heart failure (Echo 2016 EF 60-65%), COPD, remote history of right breast cancer, insulin-dependent diabetes mellitus type 2 and chronic respiratory failure (on 2lpm via [place]) who presents to [facility] department from [facility] with progressively worsening shortness of breath and cough. Pt found to have PNA with concerns of COPD exacerbation.  Subjective: pleasant, alert, talking to herself Assessment / Plan / Recommendation CHL IP  CLINICAL IMPRESSIONS 10/18/2021 Clinical Impression Patient presents with a mild oropharyngeal dysphagia as per this MBS. Her dentures were in place and appeared to be well-fitting. During oral phase of swallow, patient exhibited mild anterior to posterior transit delay with puree solids and with regular texture solids, she exhibited delay in mastication, anterior to posterior oral transit and piecemeal swallowing of bolus. She was able to transit barium tablet with sips of thin liquid barium without significant delay. During pharyngeal phase of swallow, patient exhibited delay approximately in between vallecular and pyriform sinuses with thin liquids but aspiration events and a couple questionable flash penetration events (difficult  to fully view as patient's shoulder was blocking some of view). Only trace amount of vallecular residuals observed which cleared with subsequent swallows. Barium tablet became briefly lodged in vallecular sinus but then transited fully with second sip of thin liquid barium. Esophageal sweep did not reveal any delays in bolus transit or retention of barium. SLP is recommending Dys 3 solids, thin liquids diet. SLP will follow briefly while hospitalized and recommending f/u from SLP at [facility]. SLP Visit Diagnosis -- Attention and concentration deficit following -- Frontal lobe and executive function deficit following -- Impact on safety and function --   CHL IP TREATMENT RECOMMENDATION 10/18/2021 Treatment Recommendations Therapy as outlined in treatment plan below   Prognosis 10/18/2021 Prognosis for Safe Diet Advancement Good Barriers to Reach Goals Cognitive deficits Barriers/Prognosis Comment -- CHL IP DIET RECOMMENDATION 10/18/2021 SLP Diet Recommendations Dysphagia 3 (Mech soft) solids;Thin liquid Liquid Administration via Cup;Straw Medication Administration Whole meds with liquid Compensations Minimize environmental distractions;Slow rate;Small sips/bites Postural Changes Seated upright at  90 degrees   CHL IP OTHER RECOMMENDATIONS 10/18/2021 Recommended Consults -- Oral Care Recommendations Oral care BID;Staff/trained caregiver to provide oral care Other Recommendations --   CHL IP FOLLOW UP RECOMMENDATIONS 10/18/2021 Follow up Recommendations 24 hour supervision/assistance;Skilled Nursing facility   [facility] IP FREQUENCY AND DURATION 10/18/2021 Speech Therapy Frequency (ACUTE ONLY) min 1 x/week Treatment Duration 1 week      CHL IP ORAL PHASE 10/18/2021 Oral Phase -- Oral - Pudding Teaspoon -- Oral - Pudding Cup -- Oral - Honey Teaspoon -- Oral - Honey Cup -- Oral - Nectar Teaspoon -- Oral - Nectar Cup -- Oral - Nectar Straw -- Oral - Thin Teaspoon -- Oral - Thin Cup WFL Oral - Thin Straw WFL Oral - Puree Reduced posterior propulsion;Weak lingual manipulation;Delayed oral transit Oral - Mech Soft -- Oral - Regular Weak lingual manipulation;Piecemeal swallowing;Reduced posterior propulsion;Impaired mastication;Delayed oral transit Oral - Multi-Consistency -- Oral - Pill Decreased bolus cohesion Oral Phase - Comment --  CHL IP PHARYNGEAL PHASE 10/18/2021 Pharyngeal Phase -- Pharyngeal- Pudding Teaspoon -- Pharyngeal -- Pharyngeal- Pudding Cup -- Pharyngeal -- Pharyngeal- Honey Teaspoon -- Pharyngeal -- Pharyngeal- Honey Cup -- Pharyngeal -- Pharyngeal- Nectar Teaspoon -- Pharyngeal -- Pharyngeal- Nectar Cup -- Pharyngeal -- Pharyngeal- Nectar Straw -- Pharyngeal -- Pharyngeal- Thin Teaspoon -- Pharyngeal -- Pharyngeal- Thin Cup Delayed swallow initiation-vallecula;Delayed swallow initiation-pyriform sinuses Pharyngeal -- Pharyngeal- Thin Straw Pharyngeal residue - valleculae Pharyngeal -- Pharyngeal- Puree WFL Pharyngeal -- Pharyngeal- Mechanical Soft -- Pharyngeal -- Pharyngeal- Regular Delayed swallow initiation-vallecula;Pharyngeal residue - valleculae Pharyngeal -- Pharyngeal- Multi-consistency -- Pharyngeal -- Pharyngeal- Pill Reduced pharyngeal peristalsis Pharyngeal -- Pharyngeal Comment --  CHL IP  CERVICAL ESOPHAGEAL PHASE 10/18/2021 Cervical Esophageal Phase WFL Pudding Teaspoon -- Pudding Cup -- Honey Teaspoon -- Honey Cup -- Nectar Teaspoon -- Nectar Cup -- Nectar Straw -- Thin Teaspoon -- Thin Cup -- Thin Straw -- Puree -- Mechanical Soft -- Regular -- Multi-consistency -- Pill -- Cervical Esophageal Comment -- Sonia Baller, MA, CCC-SLP Speech Therapy              Microbiology: Recent Results (from the past 240 hour(s))  Resp Panel by RT-PCR (Flu A&B, Covid) Nasopharyngeal Swab     Status: None   Collection Time: 10/16/21 12:33 AM   Specimen: Nasopharyngeal Swab; Nasopharyngeal(NP) swabs in vial transport medium  Result Value Ref Range Status   SARS Coronavirus 2 by RT PCR NEGATIVE NEGATIVE Final    Comment: (NOTE) SARS-CoV-2 target nucleic acids are NOT  DETECTED.  The SARS-CoV-2 RNA is generally detectable in upper respiratory specimens during the acute phase of infection. The lowest concentration of SARS-CoV-2 viral copies this assay can detect is 138 copies/mL. A negative result does not preclude SARS-Cov-2 infection and should not be used as the sole basis for treatment or other patient management decisions. A negative result may occur with  improper specimen collection/handling, submission of specimen other than nasopharyngeal swab, presence of viral mutation(s) within the areas targeted by this assay, and inadequate number of viral copies(<138 copies/mL). A negative result must be combined with clinical observations, patient history, and epidemiological information. The expected result is Negative.  Fact Sheet for Patients:  EntrepreneurPulse.com.au  Fact Sheet for Healthcare Providers:  IncredibleEmployment.be  This test is no t yet approved or cleared by the [place] FDA and  has been authorized for detection and/or diagnosis of SARS-CoV-2 by FDA under an Emergency Use Authorization (EUA). This EUA will remain  in effect  (meaning this test can be used) for the duration of the COVID-19 declaration under Section 564(b)(1) of the Act, 21 U.S.C.section 360bbb-3(b)(1), unless the authorization is terminated  or revoked sooner.       Influenza A by PCR NEGATIVE NEGATIVE Final   Influenza B by PCR NEGATIVE NEGATIVE Final    Comment: (NOTE) The Xpert Xpress SARS-CoV-2/FLU/RSV plus assay is intended as an aid in the diagnosis of influenza from Nasopharyngeal swab specimens and should not be used as a sole basis for treatment. Nasal washings and aspirates are unacceptable for Xpert Xpress SARS-CoV-2/FLU/RSV testing.  Fact Sheet for Patients: EntrepreneurPulse.com.au  Fact Sheet for Healthcare Providers: IncredibleEmployment.be  This test is not yet approved or cleared by the [place] FDA and has been authorized for detection and/or diagnosis of SARS-CoV-2 by FDA under an Emergency Use Authorization (EUA). This EUA will remain in effect (meaning this test can be used) for the duration of the COVID-19 declaration under Section 564(b)(1) of the Act, 21 U.S.C. section 360bbb-3(b)(1), unless the authorization is terminated or revoked.  Performed at [facility], [place] [address], [place], [place] [address]   Blood Culture (routine x 2)     Status: None   Collection Time: 10/16/21 12:42 AM   Specimen: BLOOD  Result Value Ref Range Status   Specimen Description   Final    BLOOD BLOOD RIGHT HAND Performed at [place] [address], [place], [place] [address]    Special Requests   Final    BOTTLES DRAWN AEROBIC AND ANAEROBIC Blood Culture adequate volume Performed at [place] [address], [place], [place] [address]    Culture   Final    NO GROWTH 5 DAYS Performed at [facility], [place] [address] [place], [place] [address]    Report Status 10/21/2021 FINAL  Final  Urine Culture     Status:  None   Collection Time: 10/16/21 12:42 AM   Specimen: In/Out Cath Urine  Result Value Ref Range Status   Specimen Description   Final    IN/OUT CATH URINE Performed at [place] [address] [place], [place] [address]    Special Requests   Final    NONE Performed at [facility], [place] [address], [place], [place] [address]    Culture   Final    NO GROWTH Performed at [facility], [place] [address], [place],  [address]    Report Status 10/17/2021 FINAL  Final  Blood Culture (routine x 2)     Status: None   Collection Time: 10/16/21 12:47 AM   Specimen: BLOOD  Result Value Ref Range Status   Specimen Description   Final    BLOOD BLOOD LEFT FOREARM Performed at [place] [address], [place], [place] [address]    Special Requests   Final    BOTTLES DRAWN AEROBIC AND ANAEROBIC Blood Culture adequate volume Performed at [place] [address] [place], [place] [address]    Culture   Final    NO GROWTH 5 DAYS Performed at [facility], [place] [address], [place], [place] [address]    Report Status 10/21/2021 FINAL  Final  MRSA Next Gen by PCR, Nasal     Status: Abnormal   Collection Time: 10/17/21  1:30 PM   Specimen: Nasal Mucosa; Nasal Swab  Result Value Ref Range Status   MRSA by PCR Next Gen DETECTED (A) NOT DETECTED Final    Comment: RESULT CALLED TO, READ BACK BY AND VERIFIED WITH: TICKET, S. ON 10/17/2021 @ 1450 BY MECIAL J. (NOTE) The GeneXpert MRSA Assay (FDA approved for NASAL specimens only), is one component of a comprehensive MRSA colonization surveillance program. It is not intended to diagnose MRSA infection nor to guide or monitor treatment for MRSA infections. Test performance is not FDA approved in patients less than 1 years old. Performed at [facility], [place] [address], [place], [place] [address]   Resp Panel by RT-PCR (Flu  A&B, Covid) Nasopharyngeal Swab     Status: None   Collection Time: 10/21/21  9:25 AM   Specimen: Nasopharyngeal Swab; Nasopharyngeal(NP) swabs in vial transport medium  Result Value Ref Range Status   SARS Coronavirus 2 by RT PCR NEGATIVE NEGATIVE Final    Comment: (NOTE) SARS-CoV-2 target nucleic acids are NOT DETECTED.  The SARS-CoV-2 RNA is generally detectable in upper respiratory specimens during the acute phase of infection. The lowest concentration of SARS-CoV-2 viral copies this assay can detect is 138 copies/mL. A negative result does not preclude SARS-Cov-2 infection and should not be used as the sole basis for treatment or other patient management decisions. A negative result may occur with  improper specimen collection/handling, submission of specimen other than nasopharyngeal swab, presence of viral mutation(s) within the areas targeted by this assay, and inadequate number of viral copies(<138 copies/mL). A negative result must be combined with clinical observations, patient history, and epidemiological information. The expected result is Negative.  Fact Sheet for Patients:  EntrepreneurPulse.com.au  Fact Sheet for Healthcare Providers:  IncredibleEmployment.be  This test is no t yet approved or cleared by the [place] FDA and  has been authorized for detection and/or diagnosis of SARS-CoV-2 by FDA under an Emergency Use Authorization (EUA). This EUA will remain  in effect (meaning this test can be used) for the duration of the COVID-19 declaration under Section 564(b)(1) of the Act, 21 U.S.C.section 360bbb-3(b)(1), unless the authorization is terminated  or revoked sooner.       Influenza A by PCR NEGATIVE NEGATIVE Final   Influenza B by PCR NEGATIVE NEGATIVE Final    Comment: (NOTE) The Xpert Xpress SARS-CoV-2/FLU/RSV plus assay is intended as an aid in the diagnosis of influenza from Nasopharyngeal swab specimens  and should not be used as a sole basis for treatment. Nasal washings and aspirates are unacceptable for Xpert Xpress SARS-CoV-2/FLU/RSV testing.  Fact Sheet for Patients: EntrepreneurPulse.com.au  Fact Sheet for  Healthcare Providers: IncredibleEmployment.be  This test is not yet approved or cleared by the [place] and has been authorized for detection and/or diagnosis of SARS-CoV-2 by FDA under an Emergency Use Authorization (EUA). This EUA will remain in effect (meaning this test can be used) for the duration of the COVID-19 declaration under Section 564(b)(1) of the Act, 21 U.S.C. section 360bbb-3(b)(1), unless the authorization is terminated or revoked.  Performed at [facility], [address] [place], [place] [address]      Labs: Basic Metabolic Panel: Recent Labs  Lab 10/16/21 0042 10/16/21 2159 10/17/21 0418 10/18/21 0350 10/20/21 0401 10/21/21 0358  NA 138  --  141 134* 138 138  K 3.5  --  4.5 4.4 4.7 4.7  CL 98  --  103 98 101 99  CO2 33*  --  30 27 30  34*  GLUCOSE 179*  --  162* 270* 220* 194*  BUN 34*  --  36* 47* 40* 33*  CREATININE 0.79  --  0.60 0.76 0.70 0.80  CALCIUM 8.3*  --  8.3* 8.1* 8.1* 8.3*  MG  --  1.8 1.9  --   --  2.0   Liver Function Tests: Recent Labs  Lab 10/16/21 0042 10/17/21 0418 10/18/21 0350 10/20/21 0401  AST 14* 13* 16 17  ALT 10 7 11 11   ALKPHOS 82 62 69 55  BILITOT 1.0 0.7 0.3 0.2*  PROT 6.7 6.4* 6.1* 5.3*  ALBUMIN 3.0* 2.6* 2.5* 2.4*   No results for input(s): LIPASE, AMYLASE in the last 168 hours. No results for input(s): AMMONIA in the last 168 hours. CBC: Recent Labs  Lab 10/16/21 0042 10/17/21 0418 10/18/21 0350 10/20/21 0401 10/21/21 0358  WBC 19.4* 8.4 10.5 10.0 13.1*  NEUTROABS 16.5* 7.8*  --   --  9.7*  HGB 11.2* 10.1* 9.8* 9.7* 10.5*  HCT 36.6 33.5* 32.1* 31.8* 35.0*  MCV 82.1 82.9 80.9 81.5 82.7  PLT 310 244 306 287 293   Cardiac  Enzymes: No results for input(s): CKTOTAL, CKMB, CKMBINDEX, TROPONINI in the last 168 hours. BNP: BNP (last 3 results) No results for input(s): BNP in the last 8760 hours.  ProBNP (last 3 results) No results for input(s): PROBNP in the last 8760 hours.  CBG: Recent Labs  Lab 10/20/21 1201 10/20/21 1625 10/20/21 2100 10/21/21 0713 10/21/21 1103  GLUCAP 230* 245* 272* 195* 293*       Signed:  Irine Seal MD.  [facility] 10/21/2021, 3:07 PM

## 2022-06-11 DEATH — deceased
